# Patient Record
Sex: Female | Born: 1950 | Race: White | Hispanic: No | Marital: Married | Smoking: Never smoker
Health system: Southern US, Community
[De-identification: ages and names within clinical notes are randomized; demographics above are authoritative.]

## PROBLEM LIST (undated history)

## (undated) DIAGNOSIS — I499 Cardiac arrhythmia, unspecified: Secondary | ICD-10-CM

## (undated) DIAGNOSIS — E041 Nontoxic single thyroid nodule: Secondary | ICD-10-CM

## (undated) DIAGNOSIS — Z85850 Personal history of malignant neoplasm of thyroid: Secondary | ICD-10-CM

## (undated) DIAGNOSIS — K219 Gastro-esophageal reflux disease without esophagitis: Secondary | ICD-10-CM

## (undated) DIAGNOSIS — Z2883 Immunization not carried out due to unavailability of vaccine: Secondary | ICD-10-CM

## (undated) DIAGNOSIS — I201 Angina pectoris with documented spasm: Secondary | ICD-10-CM

## (undated) DIAGNOSIS — F419 Anxiety disorder, unspecified: Secondary | ICD-10-CM

## (undated) DIAGNOSIS — K5909 Other constipation: Secondary | ICD-10-CM

## (undated) DIAGNOSIS — I10 Essential (primary) hypertension: Secondary | ICD-10-CM

## (undated) DIAGNOSIS — K589 Irritable bowel syndrome without diarrhea: Secondary | ICD-10-CM

## (undated) DIAGNOSIS — M1990 Unspecified osteoarthritis, unspecified site: Secondary | ICD-10-CM

## (undated) DIAGNOSIS — E785 Hyperlipidemia, unspecified: Secondary | ICD-10-CM

## (undated) DIAGNOSIS — H269 Unspecified cataract: Secondary | ICD-10-CM

## (undated) DIAGNOSIS — K449 Diaphragmatic hernia without obstruction or gangrene: Secondary | ICD-10-CM

## (undated) DIAGNOSIS — Z8679 Personal history of other diseases of the circulatory system: Secondary | ICD-10-CM

## (undated) DIAGNOSIS — R002 Palpitations: Secondary | ICD-10-CM

## (undated) DIAGNOSIS — S065X9A Traumatic subdural hemorrhage with loss of consciousness of unspecified duration, initial encounter: Secondary | ICD-10-CM

## (undated) DIAGNOSIS — D649 Anemia, unspecified: Secondary | ICD-10-CM

## (undated) DIAGNOSIS — M8580 Other specified disorders of bone density and structure, unspecified site: Secondary | ICD-10-CM

## (undated) DIAGNOSIS — R42 Dizziness and giddiness: Secondary | ICD-10-CM

## (undated) DIAGNOSIS — G2581 Restless legs syndrome: Secondary | ICD-10-CM

## (undated) DIAGNOSIS — G4700 Insomnia, unspecified: Secondary | ICD-10-CM

## (undated) DIAGNOSIS — A64 Unspecified sexually transmitted disease: Secondary | ICD-10-CM

## (undated) DIAGNOSIS — D071 Carcinoma in situ of vulva: Secondary | ICD-10-CM

## (undated) DIAGNOSIS — Z973 Presence of spectacles and contact lenses: Secondary | ICD-10-CM

## (undated) DIAGNOSIS — C73 Malignant neoplasm of thyroid gland: Secondary | ICD-10-CM

## (undated) DIAGNOSIS — K900 Celiac disease: Secondary | ICD-10-CM

## (undated) HISTORY — PX: THYROID LOBECTOMY: SHX420

## (undated) HISTORY — DX: Unspecified sexually transmitted disease: A64

## (undated) HISTORY — DX: Malignant neoplasm of thyroid gland: C73

## (undated) HISTORY — DX: Angina pectoris with documented spasm: I20.1

## (undated) HISTORY — DX: Diaphragmatic hernia without obstruction or gangrene: K44.9

## (undated) HISTORY — PX: THYROIDECTOMY, PARTIAL: SHX18

## (undated) HISTORY — DX: Anemia, unspecified: D64.9

## (undated) HISTORY — DX: Hyperlipidemia, unspecified: E78.5

## (undated) HISTORY — DX: Traumatic subdural hemorrhage with loss of consciousness of unspecified duration, initial encounter: S06.5X9A

## (undated) HISTORY — DX: Dizziness and giddiness: R42

## (undated) HISTORY — DX: Unspecified osteoarthritis, unspecified site: M19.90

## (undated) HISTORY — DX: Insomnia, unspecified: G47.00

## (undated) HISTORY — DX: Other specified disorders of bone density and structure, unspecified site: M85.80

## (undated) HISTORY — PX: REDUCTION MAMMAPLASTY: SUR839

## (undated) HISTORY — PX: COLONOSCOPY: SHX174

## (undated) HISTORY — PX: SUBDURAL HEMATOMA EVACUATION VIA CRANIOTOMY: SUR319

## (undated) HISTORY — DX: Gastro-esophageal reflux disease without esophagitis: K21.9

## (undated) HISTORY — DX: Restless legs syndrome: G25.81

## (undated) HISTORY — PX: TONSILLECTOMY: SUR1361

## (undated) HISTORY — DX: Irritable bowel syndrome, unspecified: K58.9

## (undated) HISTORY — PX: BREAST BIOPSY: SHX20

## (undated) HISTORY — DX: Essential (primary) hypertension: I10

## (undated) HISTORY — PX: ESOPHAGOGASTRODUODENOSCOPY: SHX1529

## (undated) HISTORY — DX: Anxiety disorder, unspecified: F41.9

---
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
## 1984-05-09 HISTORY — PX: ABDOMINAL HYSTERECTOMY: SHX81

## 1997-05-09 HISTORY — PX: BREAST REDUCTION SURGERY: SHX8

## 1998-06-12 ENCOUNTER — Other Ambulatory Visit: Admission: RE | Admit: 1998-06-12 | Discharge: 1998-06-12 | Payer: Self-pay | Admitting: Obstetrics and Gynecology

## 1998-12-15 ENCOUNTER — Ambulatory Visit: Admission: RE | Admit: 1998-12-15 | Discharge: 1998-12-15 | Payer: Self-pay | Admitting: Gynecologic Oncology

## 1999-04-13 ENCOUNTER — Encounter: Payer: Self-pay | Admitting: Emergency Medicine

## 1999-04-13 ENCOUNTER — Emergency Department (HOSPITAL_COMMUNITY): Admission: EM | Admit: 1999-04-13 | Discharge: 1999-04-13 | Payer: Self-pay | Admitting: Emergency Medicine

## 1999-04-22 ENCOUNTER — Ambulatory Visit (HOSPITAL_COMMUNITY): Admission: RE | Admit: 1999-04-22 | Discharge: 1999-04-22 | Payer: Self-pay | Admitting: Gastroenterology

## 1999-07-06 ENCOUNTER — Other Ambulatory Visit: Admission: RE | Admit: 1999-07-06 | Discharge: 1999-07-06 | Payer: Self-pay | Admitting: Obstetrics and Gynecology

## 2000-05-09 HISTORY — PX: KNEE SURGERY: SHX244

## 2000-05-09 HISTORY — PX: KNEE ARTHROSCOPY: SUR90

## 2000-08-25 ENCOUNTER — Encounter: Payer: Self-pay | Admitting: Emergency Medicine

## 2000-08-25 ENCOUNTER — Emergency Department (HOSPITAL_COMMUNITY): Admission: EM | Admit: 2000-08-25 | Discharge: 2000-08-25 | Payer: Self-pay | Admitting: Emergency Medicine

## 2000-09-04 ENCOUNTER — Other Ambulatory Visit: Admission: RE | Admit: 2000-09-04 | Discharge: 2000-09-04 | Payer: Self-pay | Admitting: Obstetrics and Gynecology

## 2001-03-29 ENCOUNTER — Encounter: Payer: Self-pay | Admitting: Otolaryngology

## 2001-03-29 ENCOUNTER — Encounter: Payer: Self-pay | Admitting: Gastroenterology

## 2001-03-29 ENCOUNTER — Ambulatory Visit (HOSPITAL_COMMUNITY): Admission: RE | Admit: 2001-03-29 | Discharge: 2001-03-29 | Payer: Self-pay | Admitting: Otolaryngology

## 2001-04-04 ENCOUNTER — Other Ambulatory Visit: Admission: RE | Admit: 2001-04-04 | Discharge: 2001-04-04 | Payer: Self-pay | Admitting: Otolaryngology

## 2001-04-17 ENCOUNTER — Encounter: Admission: RE | Admit: 2001-04-17 | Discharge: 2001-04-17 | Payer: Self-pay | Admitting: Otolaryngology

## 2001-04-17 ENCOUNTER — Encounter: Payer: Self-pay | Admitting: Otolaryngology

## 2001-04-17 ENCOUNTER — Encounter: Payer: Self-pay | Admitting: Gastroenterology

## 2001-05-03 ENCOUNTER — Encounter: Payer: Self-pay | Admitting: Gastroenterology

## 2001-05-04 ENCOUNTER — Encounter (INDEPENDENT_AMBULATORY_CARE_PROVIDER_SITE_OTHER): Payer: Self-pay | Admitting: *Deleted

## 2001-05-04 ENCOUNTER — Encounter: Payer: Self-pay | Admitting: Gastroenterology

## 2001-05-04 ENCOUNTER — Ambulatory Visit (HOSPITAL_COMMUNITY): Admission: RE | Admit: 2001-05-04 | Discharge: 2001-05-05 | Payer: Self-pay | Admitting: Otolaryngology

## 2001-07-04 ENCOUNTER — Other Ambulatory Visit: Admission: RE | Admit: 2001-07-04 | Discharge: 2001-07-04 | Payer: Self-pay | Admitting: Obstetrics and Gynecology

## 2003-12-08 ENCOUNTER — Encounter: Admission: RE | Admit: 2003-12-08 | Discharge: 2003-12-08 | Payer: Self-pay | Admitting: Otolaryngology

## 2003-12-08 ENCOUNTER — Encounter: Payer: Self-pay | Admitting: Gastroenterology

## 2003-12-11 ENCOUNTER — Other Ambulatory Visit: Admission: RE | Admit: 2003-12-11 | Discharge: 2003-12-11 | Payer: Self-pay | Admitting: Obstetrics and Gynecology

## 2004-03-26 ENCOUNTER — Ambulatory Visit (HOSPITAL_COMMUNITY): Admission: RE | Admit: 2004-03-26 | Discharge: 2004-03-26 | Payer: Self-pay | Admitting: Gastroenterology

## 2004-04-14 ENCOUNTER — Ambulatory Visit: Payer: Self-pay | Admitting: Internal Medicine

## 2004-06-30 ENCOUNTER — Ambulatory Visit: Payer: Self-pay | Admitting: Internal Medicine

## 2004-08-17 ENCOUNTER — Ambulatory Visit: Payer: Self-pay | Admitting: Internal Medicine

## 2004-08-24 ENCOUNTER — Ambulatory Visit: Payer: Self-pay | Admitting: Internal Medicine

## 2004-11-03 ENCOUNTER — Ambulatory Visit (HOSPITAL_COMMUNITY): Admission: RE | Admit: 2004-11-03 | Discharge: 2004-11-03 | Payer: Self-pay | Admitting: *Deleted

## 2004-11-10 ENCOUNTER — Emergency Department (HOSPITAL_COMMUNITY): Admission: EM | Admit: 2004-11-10 | Discharge: 2004-11-10 | Payer: Self-pay | Admitting: Emergency Medicine

## 2004-11-11 ENCOUNTER — Ambulatory Visit (HOSPITAL_COMMUNITY): Admission: RE | Admit: 2004-11-11 | Discharge: 2004-11-11 | Payer: Self-pay | Admitting: Gastroenterology

## 2004-11-19 ENCOUNTER — Ambulatory Visit (HOSPITAL_COMMUNITY): Admission: RE | Admit: 2004-11-19 | Discharge: 2004-11-19 | Payer: Self-pay | Admitting: Gastroenterology

## 2004-12-13 ENCOUNTER — Other Ambulatory Visit: Admission: RE | Admit: 2004-12-13 | Discharge: 2004-12-13 | Payer: Self-pay | Admitting: Addiction Medicine

## 2004-12-22 ENCOUNTER — Emergency Department (HOSPITAL_COMMUNITY): Admission: EM | Admit: 2004-12-22 | Discharge: 2004-12-22 | Payer: Self-pay | Admitting: Emergency Medicine

## 2005-03-24 ENCOUNTER — Ambulatory Visit: Payer: Self-pay | Admitting: Family Medicine

## 2005-05-30 ENCOUNTER — Ambulatory Visit: Payer: Self-pay | Admitting: Internal Medicine

## 2005-09-27 ENCOUNTER — Ambulatory Visit: Payer: Self-pay | Admitting: Family Medicine

## 2005-11-16 ENCOUNTER — Ambulatory Visit: Payer: Self-pay | Admitting: Internal Medicine

## 2005-11-17 ENCOUNTER — Ambulatory Visit: Payer: Self-pay | Admitting: Internal Medicine

## 2005-12-29 ENCOUNTER — Other Ambulatory Visit: Admission: RE | Admit: 2005-12-29 | Discharge: 2005-12-29 | Payer: Self-pay | Admitting: Obstetrics and Gynecology

## 2006-01-04 ENCOUNTER — Emergency Department (HOSPITAL_COMMUNITY): Admission: EM | Admit: 2006-01-04 | Discharge: 2006-01-04 | Payer: Self-pay | Admitting: Emergency Medicine

## 2006-01-26 ENCOUNTER — Ambulatory Visit: Payer: Self-pay | Admitting: Internal Medicine

## 2006-04-11 ENCOUNTER — Ambulatory Visit: Payer: Self-pay | Admitting: Internal Medicine

## 2006-04-12 ENCOUNTER — Ambulatory Visit: Payer: Self-pay | Admitting: Internal Medicine

## 2006-04-12 LAB — CONVERTED CEMR LAB
ALT: 28 units/L (ref 0–40)
AST: 31 units/L (ref 0–37)
LDL Cholesterol: 97 mg/dL (ref 0–99)
Triglyceride fasting, serum: 70 mg/dL (ref 0–149)

## 2006-06-23 ENCOUNTER — Ambulatory Visit: Payer: Self-pay | Admitting: Internal Medicine

## 2006-09-05 ENCOUNTER — Ambulatory Visit: Payer: Self-pay | Admitting: Internal Medicine

## 2006-09-05 LAB — CONVERTED CEMR LAB
ALT: 28 units/L (ref 0–40)
AST: 28 units/L (ref 0–37)
Creatinine,U: 82.4 mg/dL
Total CHOL/HDL Ratio: 2.7
Triglycerides: 72 mg/dL (ref 0–149)

## 2006-10-03 DIAGNOSIS — E162 Hypoglycemia, unspecified: Secondary | ICD-10-CM | POA: Insufficient documentation

## 2006-10-03 DIAGNOSIS — K219 Gastro-esophageal reflux disease without esophagitis: Secondary | ICD-10-CM | POA: Insufficient documentation

## 2006-10-03 DIAGNOSIS — R0989 Other specified symptoms and signs involving the circulatory and respiratory systems: Secondary | ICD-10-CM | POA: Insufficient documentation

## 2006-10-03 DIAGNOSIS — M8580 Other specified disorders of bone density and structure, unspecified site: Secondary | ICD-10-CM | POA: Insufficient documentation

## 2006-10-03 DIAGNOSIS — R7989 Other specified abnormal findings of blood chemistry: Secondary | ICD-10-CM | POA: Insufficient documentation

## 2006-10-03 DIAGNOSIS — Z85850 Personal history of malignant neoplasm of thyroid: Secondary | ICD-10-CM | POA: Insufficient documentation

## 2006-11-29 ENCOUNTER — Encounter: Payer: Self-pay | Admitting: Internal Medicine

## 2007-01-26 ENCOUNTER — Other Ambulatory Visit: Admission: RE | Admit: 2007-01-26 | Discharge: 2007-01-26 | Payer: Self-pay | Admitting: Obstetrics and Gynecology

## 2007-07-05 ENCOUNTER — Ambulatory Visit: Payer: Self-pay | Admitting: Internal Medicine

## 2007-07-05 DIAGNOSIS — G479 Sleep disorder, unspecified: Secondary | ICD-10-CM

## 2007-07-05 DIAGNOSIS — M1990 Unspecified osteoarthritis, unspecified site: Secondary | ICD-10-CM | POA: Insufficient documentation

## 2007-07-05 DIAGNOSIS — G4700 Insomnia, unspecified: Secondary | ICD-10-CM | POA: Insufficient documentation

## 2007-07-07 LAB — CONVERTED CEMR LAB
Eosinophils Relative: 2.8 % (ref 0.0–5.0)
Ferritin: 13.1 ng/mL (ref 10.0–291.0)
Free T4: 0.8 ng/dL (ref 0.6–1.6)
HCT: 37 % (ref 36.0–46.0)
MCV: 90.9 fL (ref 78.0–100.0)
Neutrophils Relative %: 63.8 % (ref 43.0–77.0)
RBC: 4.07 M/uL (ref 3.87–5.11)
RDW: 12.8 % (ref 11.5–14.6)
Rhuematoid fact SerPl-aCnc: <20 intl units/mL — ABNORMAL LOW (ref 0.0–20.0)
Sed Rate: 14 mm/hr (ref 0–25)
TSH: 0.89 microintl units/mL (ref 0.35–5.50)
WBC: 5.5 10*3/uL (ref 4.5–10.5)

## 2007-07-10 ENCOUNTER — Encounter (INDEPENDENT_AMBULATORY_CARE_PROVIDER_SITE_OTHER): Payer: Self-pay | Admitting: *Deleted

## 2007-07-25 ENCOUNTER — Telehealth (INDEPENDENT_AMBULATORY_CARE_PROVIDER_SITE_OTHER): Payer: Self-pay | Admitting: *Deleted

## 2007-07-26 ENCOUNTER — Ambulatory Visit: Payer: Self-pay | Admitting: Internal Medicine

## 2007-08-02 ENCOUNTER — Ambulatory Visit: Payer: Self-pay | Admitting: Gastroenterology

## 2007-08-02 ENCOUNTER — Encounter (INDEPENDENT_AMBULATORY_CARE_PROVIDER_SITE_OTHER): Payer: Self-pay | Admitting: *Deleted

## 2007-08-02 LAB — CONVERTED CEMR LAB
Albumin: 4.3 g/dL (ref 3.5–5.2)
Alkaline Phosphatase: 60 units/L (ref 39–117)
BUN: 14 mg/dL (ref 6–23)
Basophils Absolute: 0 10*3/uL (ref 0.0–0.1)
Bilirubin, Direct: 0.1 mg/dL (ref 0.0–0.3)
Calcium: 9.5 mg/dL (ref 8.4–10.5)
GFR calc Af Amer: 95 mL/min
GFR calc non Af Amer: 79 mL/min
Lymphocytes Relative: 26.3 % (ref 12.0–46.0)
MCHC: 33.1 g/dL (ref 30.0–36.0)
Neutrophils Relative %: 65.4 % (ref 43.0–77.0)
Potassium: 4.5 meq/L (ref 3.5–5.1)
RDW: 12.5 % (ref 11.5–14.6)
Saturation Ratios: 16.6 % — ABNORMAL LOW (ref 20.0–50.0)
Sed Rate: 21 mm/hr (ref 0–25)
Sodium: 139 meq/L (ref 135–145)
Tissue Transglutaminase Ab, IgA: 0.4 units (ref <7)
Transferrin: 309.9 mg/dL (ref >=212.0)
Vitamin B-12: 826 pg/mL (ref 211–911)

## 2007-08-08 ENCOUNTER — Ambulatory Visit: Payer: Self-pay | Admitting: Gastroenterology

## 2007-08-08 ENCOUNTER — Encounter: Payer: Self-pay | Admitting: Internal Medicine

## 2007-08-08 ENCOUNTER — Encounter: Payer: Self-pay | Admitting: Gastroenterology

## 2007-08-09 ENCOUNTER — Telehealth: Payer: Self-pay | Admitting: Internal Medicine

## 2007-10-30 ENCOUNTER — Ambulatory Visit: Payer: Self-pay | Admitting: Internal Medicine

## 2007-11-02 ENCOUNTER — Telehealth (INDEPENDENT_AMBULATORY_CARE_PROVIDER_SITE_OTHER): Payer: Self-pay | Admitting: *Deleted

## 2007-11-02 LAB — CONVERTED CEMR LAB
Basophils Absolute: 0 10*3/uL (ref 0.0–0.1)
Basophils Relative: 0 % (ref 0.0–1.0)
Eosinophils Absolute: 0.1 10*3/uL (ref 0.0–0.7)
HCT: 32.7 % — ABNORMAL LOW (ref 36.0–46.0)
MCHC: 34.2 g/dL (ref 30.0–36.0)
MCV: 91.2 fL (ref 78.0–100.0)
Monocytes Absolute: 0.2 10*3/uL (ref 0.1–1.0)
Neutro Abs: 3.5 10*3/uL (ref 1.4–7.7)
Neutrophils Relative %: 64.3 % (ref 43.0–77.0)
RBC: 3.58 M/uL — ABNORMAL LOW (ref 3.87–5.11)

## 2007-11-27 ENCOUNTER — Ambulatory Visit: Payer: Self-pay | Admitting: Internal Medicine

## 2007-11-27 DIAGNOSIS — K900 Celiac disease: Secondary | ICD-10-CM | POA: Insufficient documentation

## 2007-11-27 LAB — CONVERTED CEMR LAB
Nitrite: NEGATIVE
Protein, U semiquant: NEGATIVE
Urobilinogen, UA: 0.2

## 2007-11-29 ENCOUNTER — Telehealth (INDEPENDENT_AMBULATORY_CARE_PROVIDER_SITE_OTHER): Payer: Self-pay | Admitting: *Deleted

## 2007-11-30 ENCOUNTER — Telehealth (INDEPENDENT_AMBULATORY_CARE_PROVIDER_SITE_OTHER): Payer: Self-pay | Admitting: *Deleted

## 2007-12-03 LAB — CONVERTED CEMR LAB
Eosinophils Absolute: 0.1 10*3/uL (ref 0.0–0.7)
Eosinophils Relative: 1.8 % (ref 0.0–5.0)
Folate: >20 ng/mL
HCT: 35.3 % — ABNORMAL LOW (ref 36.0–46.0)
HDL: 70.9 mg/dL (ref >39.0)
MCHC: 33.8 g/dL (ref 30.0–36.0)
MCV: 89.7 fL (ref 78.0–100.0)
Monocytes Absolute: 0.4 10*3/uL (ref 0.1–1.0)
Platelets: 241 10*3/uL (ref 150–400)
RDW: 12.5 % (ref 11.5–14.6)
Triglycerides: 83 mg/dL (ref 0–149)
WBC: 5.1 10*3/uL (ref 4.5–10.5)

## 2007-12-05 ENCOUNTER — Telehealth (INDEPENDENT_AMBULATORY_CARE_PROVIDER_SITE_OTHER): Payer: Self-pay | Admitting: *Deleted

## 2008-01-28 ENCOUNTER — Ambulatory Visit: Payer: Self-pay | Admitting: Obstetrics and Gynecology

## 2008-01-28 ENCOUNTER — Other Ambulatory Visit: Admission: RE | Admit: 2008-01-28 | Discharge: 2008-01-28 | Payer: Self-pay | Admitting: Obstetrics and Gynecology

## 2008-01-28 ENCOUNTER — Encounter: Payer: Self-pay | Admitting: Obstetrics and Gynecology

## 2008-02-11 ENCOUNTER — Encounter: Admission: RE | Admit: 2008-02-11 | Discharge: 2008-02-11 | Payer: Self-pay | Admitting: Obstetrics and Gynecology

## 2008-02-16 ENCOUNTER — Emergency Department (HOSPITAL_BASED_OUTPATIENT_CLINIC_OR_DEPARTMENT_OTHER): Admission: EM | Admit: 2008-02-16 | Discharge: 2008-02-16 | Payer: Self-pay | Admitting: Emergency Medicine

## 2008-03-04 ENCOUNTER — Telehealth (INDEPENDENT_AMBULATORY_CARE_PROVIDER_SITE_OTHER): Payer: Self-pay | Admitting: *Deleted

## 2008-03-10 ENCOUNTER — Ambulatory Visit: Payer: Self-pay | Admitting: Internal Medicine

## 2008-03-19 ENCOUNTER — Encounter (INDEPENDENT_AMBULATORY_CARE_PROVIDER_SITE_OTHER): Payer: Self-pay | Admitting: *Deleted

## 2008-03-19 ENCOUNTER — Telehealth (INDEPENDENT_AMBULATORY_CARE_PROVIDER_SITE_OTHER): Payer: Self-pay | Admitting: *Deleted

## 2008-03-19 LAB — CONVERTED CEMR LAB
ALT: 19 units/L (ref 0–35)
Alkaline Phosphatase: 64 units/L (ref 39–117)
Bilirubin, Direct: 0.1 mg/dL (ref 0.0–0.3)
CO2: 29 meq/L (ref 19–32)
Chloride: 107 meq/L (ref 96–112)
Cholesterol: 240 mg/dL (ref 0–200)
Glucose, Bld: 99 mg/dL (ref 70–99)
Hemoglobin: 12.2 g/dL (ref 12.0–15.0)
Lymphocytes Relative: 30.5 % (ref 12.0–46.0)
Monocytes Relative: 4.7 % (ref 3.0–12.0)
Neutro Abs: 3 10*3/uL (ref 1.4–7.7)
Platelets: 223 10*3/uL (ref 150–400)
Potassium: 4.6 meq/L (ref 3.5–5.1)
RDW: 13.4 % (ref 11.5–14.6)
Sodium: 143 meq/L (ref 135–145)
Total CHOL/HDL Ratio: 3.3
Total Protein: 7.5 g/dL (ref 6.0–8.3)
VLDL: 20 mg/dL (ref 0–40)

## 2008-05-09 HISTORY — PX: SUBDURAL HEMATOMA EVACUATION VIA CRANIOTOMY: SUR319

## 2008-07-21 ENCOUNTER — Ambulatory Visit: Payer: Self-pay | Admitting: Obstetrics and Gynecology

## 2008-08-15 ENCOUNTER — Telehealth (INDEPENDENT_AMBULATORY_CARE_PROVIDER_SITE_OTHER): Payer: Self-pay | Admitting: *Deleted

## 2008-08-20 ENCOUNTER — Telehealth: Payer: Self-pay | Admitting: Internal Medicine

## 2008-09-08 ENCOUNTER — Ambulatory Visit: Payer: Self-pay | Admitting: Obstetrics and Gynecology

## 2008-09-16 ENCOUNTER — Telehealth (INDEPENDENT_AMBULATORY_CARE_PROVIDER_SITE_OTHER): Payer: Self-pay | Admitting: *Deleted

## 2008-09-23 ENCOUNTER — Ambulatory Visit: Payer: Self-pay | Admitting: Internal Medicine

## 2008-09-23 LAB — CONVERTED CEMR LAB
HDL: 72.8 mg/dL (ref >39.00)
TSH: 0.95 microintl units/mL (ref 0.35–5.50)
Total CHOL/HDL Ratio: 3
VLDL: 19.8 mg/dL (ref 0.0–40.0)

## 2008-09-26 ENCOUNTER — Ambulatory Visit: Payer: Self-pay | Admitting: Internal Medicine

## 2008-09-26 DIAGNOSIS — E785 Hyperlipidemia, unspecified: Secondary | ICD-10-CM | POA: Insufficient documentation

## 2008-10-02 ENCOUNTER — Encounter: Admission: RE | Admit: 2008-10-02 | Discharge: 2008-10-02 | Payer: Self-pay | Admitting: Internal Medicine

## 2008-10-03 ENCOUNTER — Encounter (INDEPENDENT_AMBULATORY_CARE_PROVIDER_SITE_OTHER): Payer: Self-pay | Admitting: *Deleted

## 2008-10-07 ENCOUNTER — Encounter (INDEPENDENT_AMBULATORY_CARE_PROVIDER_SITE_OTHER): Payer: Self-pay | Admitting: *Deleted

## 2008-10-08 ENCOUNTER — Telehealth (INDEPENDENT_AMBULATORY_CARE_PROVIDER_SITE_OTHER): Payer: Self-pay | Admitting: *Deleted

## 2008-10-17 ENCOUNTER — Ambulatory Visit: Payer: Self-pay | Admitting: Family Medicine

## 2008-10-17 LAB — CONVERTED CEMR LAB
Ketones, urine, test strip: NEGATIVE
Nitrite: NEGATIVE
Protein, U semiquant: NEGATIVE

## 2008-10-19 ENCOUNTER — Emergency Department (HOSPITAL_BASED_OUTPATIENT_CLINIC_OR_DEPARTMENT_OTHER): Admission: EM | Admit: 2008-10-19 | Discharge: 2008-10-19 | Payer: Self-pay | Admitting: Emergency Medicine

## 2008-10-19 ENCOUNTER — Ambulatory Visit: Payer: Self-pay | Admitting: Diagnostic Radiology

## 2008-10-20 ENCOUNTER — Encounter: Payer: Self-pay | Admitting: Family Medicine

## 2008-10-21 ENCOUNTER — Encounter: Payer: Self-pay | Admitting: Family Medicine

## 2008-10-22 ENCOUNTER — Telehealth (INDEPENDENT_AMBULATORY_CARE_PROVIDER_SITE_OTHER): Payer: Self-pay | Admitting: *Deleted

## 2008-11-01 ENCOUNTER — Ambulatory Visit: Payer: Self-pay | Admitting: Family Medicine

## 2008-11-01 LAB — CONVERTED CEMR LAB
Bilirubin Urine: NEGATIVE
Glucose, Urine, Semiquant: NEGATIVE
Ketones, urine, test strip: NEGATIVE
Protein, U semiquant: NEGATIVE
Urobilinogen, UA: 0.2
pH: 5

## 2008-11-18 ENCOUNTER — Ambulatory Visit: Payer: Self-pay | Admitting: Obstetrics and Gynecology

## 2009-01-14 ENCOUNTER — Encounter: Payer: Self-pay | Admitting: Internal Medicine

## 2009-01-28 ENCOUNTER — Ambulatory Visit: Payer: Self-pay | Admitting: Obstetrics and Gynecology

## 2009-01-28 ENCOUNTER — Encounter: Payer: Self-pay | Admitting: Obstetrics and Gynecology

## 2009-01-28 ENCOUNTER — Other Ambulatory Visit: Admission: RE | Admit: 2009-01-28 | Discharge: 2009-01-28 | Payer: Self-pay | Admitting: Obstetrics and Gynecology

## 2009-02-03 ENCOUNTER — Encounter: Payer: Self-pay | Admitting: Internal Medicine

## 2009-02-16 DIAGNOSIS — K2990 Gastroduodenitis, unspecified, without bleeding: Secondary | ICD-10-CM

## 2009-02-16 DIAGNOSIS — K589 Irritable bowel syndrome without diarrhea: Secondary | ICD-10-CM | POA: Insufficient documentation

## 2009-02-16 DIAGNOSIS — K449 Diaphragmatic hernia without obstruction or gangrene: Secondary | ICD-10-CM | POA: Insufficient documentation

## 2009-02-16 DIAGNOSIS — K2970 Gastritis, unspecified, without bleeding: Secondary | ICD-10-CM | POA: Insufficient documentation

## 2009-02-16 DIAGNOSIS — K2980 Duodenitis without bleeding: Secondary | ICD-10-CM | POA: Insufficient documentation

## 2009-02-18 ENCOUNTER — Encounter: Admission: RE | Admit: 2009-02-18 | Discharge: 2009-02-18 | Payer: Self-pay | Admitting: Internal Medicine

## 2009-03-24 ENCOUNTER — Encounter: Payer: Self-pay | Admitting: Internal Medicine

## 2009-04-20 ENCOUNTER — Telehealth (INDEPENDENT_AMBULATORY_CARE_PROVIDER_SITE_OTHER): Payer: Self-pay | Admitting: *Deleted

## 2009-04-20 ENCOUNTER — Ambulatory Visit: Payer: Self-pay | Admitting: Internal Medicine

## 2009-04-29 ENCOUNTER — Telehealth (INDEPENDENT_AMBULATORY_CARE_PROVIDER_SITE_OTHER): Payer: Self-pay | Admitting: *Deleted

## 2009-04-30 ENCOUNTER — Ambulatory Visit: Payer: Self-pay | Admitting: Cardiology

## 2009-04-30 ENCOUNTER — Ambulatory Visit: Payer: Self-pay | Admitting: Internal Medicine

## 2009-04-30 ENCOUNTER — Inpatient Hospital Stay (HOSPITAL_COMMUNITY): Admission: AD | Admit: 2009-04-30 | Discharge: 2009-05-05 | Payer: Self-pay | Admitting: Neurosurgery

## 2009-04-30 ENCOUNTER — Telehealth (INDEPENDENT_AMBULATORY_CARE_PROVIDER_SITE_OTHER): Payer: Self-pay | Admitting: *Deleted

## 2009-04-30 DIAGNOSIS — I6200 Nontraumatic subdural hemorrhage, unspecified: Secondary | ICD-10-CM | POA: Insufficient documentation

## 2009-04-30 LAB — CONVERTED CEMR LAB: Creatinine, Ser: 0.8 mg/dL (ref 0.4–1.2)

## 2009-05-11 ENCOUNTER — Encounter: Payer: Self-pay | Admitting: Internal Medicine

## 2009-05-11 ENCOUNTER — Encounter: Admission: RE | Admit: 2009-05-11 | Discharge: 2009-05-11 | Payer: Self-pay | Admitting: Neurosurgery

## 2009-05-27 ENCOUNTER — Encounter: Payer: Self-pay | Admitting: Internal Medicine

## 2009-05-27 ENCOUNTER — Telehealth: Payer: Self-pay | Admitting: Internal Medicine

## 2009-05-27 ENCOUNTER — Encounter: Admission: RE | Admit: 2009-05-27 | Discharge: 2009-05-27 | Payer: Self-pay | Admitting: Neurosurgery

## 2009-06-05 ENCOUNTER — Telehealth: Payer: Self-pay | Admitting: Internal Medicine

## 2009-06-10 ENCOUNTER — Ambulatory Visit: Payer: Self-pay | Admitting: Family

## 2009-06-25 ENCOUNTER — Ambulatory Visit: Payer: Self-pay | Admitting: Family

## 2009-06-25 DIAGNOSIS — J309 Allergic rhinitis, unspecified: Secondary | ICD-10-CM | POA: Insufficient documentation

## 2009-06-25 DIAGNOSIS — J209 Acute bronchitis, unspecified: Secondary | ICD-10-CM | POA: Insufficient documentation

## 2009-07-21 ENCOUNTER — Encounter: Payer: Self-pay | Admitting: Gastroenterology

## 2009-07-22 ENCOUNTER — Ambulatory Visit: Payer: Self-pay | Admitting: Internal Medicine

## 2009-08-06 ENCOUNTER — Telehealth (INDEPENDENT_AMBULATORY_CARE_PROVIDER_SITE_OTHER): Payer: Self-pay | Admitting: *Deleted

## 2009-08-25 ENCOUNTER — Ambulatory Visit: Payer: Self-pay | Admitting: Cardiovascular Disease

## 2009-08-25 ENCOUNTER — Telehealth (INDEPENDENT_AMBULATORY_CARE_PROVIDER_SITE_OTHER): Payer: Self-pay | Admitting: *Deleted

## 2009-08-25 ENCOUNTER — Ambulatory Visit: Payer: Self-pay | Admitting: Internal Medicine

## 2009-08-26 ENCOUNTER — Encounter (INDEPENDENT_AMBULATORY_CARE_PROVIDER_SITE_OTHER): Payer: Self-pay | Admitting: *Deleted

## 2009-08-27 LAB — CONVERTED CEMR LAB
Basophils Relative: 0.2 % (ref 0.0–3.0)
Eosinophils Relative: 1.3 % (ref 0.0–5.0)
HCT: 37.8 % (ref 36.0–46.0)
MCV: 89.8 fL (ref 78.0–100.0)
Monocytes Absolute: 0.4 10*3/uL (ref 0.1–1.0)
Monocytes Relative: 8 % (ref 3.0–12.0)
Neutrophils Relative %: 65.2 % (ref 43.0–77.0)
RBC: 4.21 M/uL (ref 3.87–5.11)
WBC: 4.4 10*3/uL — ABNORMAL LOW (ref 4.5–10.5)

## 2009-09-09 ENCOUNTER — Ambulatory Visit: Payer: Self-pay | Admitting: Internal Medicine

## 2009-09-10 ENCOUNTER — Encounter: Payer: Self-pay | Admitting: Internal Medicine

## 2009-09-22 ENCOUNTER — Encounter: Payer: Self-pay | Admitting: Gastroenterology

## 2009-10-17 ENCOUNTER — Ambulatory Visit: Payer: Self-pay | Admitting: Family Medicine

## 2009-10-17 ENCOUNTER — Encounter: Payer: Self-pay | Admitting: Internal Medicine

## 2009-10-17 DIAGNOSIS — M545 Low back pain, unspecified: Secondary | ICD-10-CM | POA: Insufficient documentation

## 2009-10-17 LAB — CONVERTED CEMR LAB
Bilirubin Urine: NEGATIVE
Blood in Urine, dipstick: NEGATIVE
Glucose, Urine, Semiquant: NEGATIVE
Ketones, urine, test strip: NEGATIVE
Nitrite: NEGATIVE
Protein, U semiquant: NEGATIVE
Specific Gravity, Urine: <1.005
Urobilinogen, UA: 0.2
WBC Urine, dipstick: NEGATIVE
pH: 6

## 2009-10-24 ENCOUNTER — Emergency Department (HOSPITAL_COMMUNITY): Admission: EM | Admit: 2009-10-24 | Discharge: 2009-10-24 | Payer: Self-pay | Admitting: Emergency Medicine

## 2009-10-26 ENCOUNTER — Encounter: Payer: Self-pay | Admitting: Gastroenterology

## 2009-11-18 ENCOUNTER — Encounter: Payer: Self-pay | Admitting: Gastroenterology

## 2009-11-24 ENCOUNTER — Encounter (INDEPENDENT_AMBULATORY_CARE_PROVIDER_SITE_OTHER): Payer: Self-pay | Admitting: *Deleted

## 2009-11-24 ENCOUNTER — Ambulatory Visit: Payer: Self-pay | Admitting: Gastroenterology

## 2009-11-30 ENCOUNTER — Ambulatory Visit: Payer: Self-pay | Admitting: Gastroenterology

## 2009-12-01 LAB — CONVERTED CEMR LAB: UREASE: NEGATIVE

## 2009-12-03 ENCOUNTER — Encounter: Payer: Self-pay | Admitting: Gastroenterology

## 2010-01-27 ENCOUNTER — Telehealth: Payer: Self-pay | Admitting: Gastroenterology

## 2010-01-27 DIAGNOSIS — R1013 Epigastric pain: Secondary | ICD-10-CM | POA: Insufficient documentation

## 2010-01-29 ENCOUNTER — Telehealth: Payer: Self-pay | Admitting: Gastroenterology

## 2010-02-08 ENCOUNTER — Ambulatory Visit: Payer: Self-pay | Admitting: Obstetrics and Gynecology

## 2010-02-08 ENCOUNTER — Other Ambulatory Visit: Admission: RE | Admit: 2010-02-08 | Discharge: 2010-02-08 | Payer: Self-pay | Admitting: Obstetrics and Gynecology

## 2010-02-10 ENCOUNTER — Encounter: Admission: RE | Admit: 2010-02-10 | Discharge: 2010-02-10 | Payer: Self-pay | Admitting: Internal Medicine

## 2010-02-23 ENCOUNTER — Encounter: Admission: RE | Admit: 2010-02-23 | Discharge: 2010-02-23 | Payer: Self-pay | Admitting: Internal Medicine

## 2010-05-11 ENCOUNTER — Ambulatory Visit
Admission: RE | Admit: 2010-05-11 | Discharge: 2010-05-11 | Payer: Self-pay | Source: Home / Self Care | Attending: Obstetrics and Gynecology | Admitting: Obstetrics and Gynecology

## 2010-05-12 ENCOUNTER — Encounter
Admission: RE | Admit: 2010-05-12 | Discharge: 2010-05-12 | Payer: Self-pay | Source: Home / Self Care | Attending: Obstetrics and Gynecology | Admitting: Obstetrics and Gynecology

## 2010-05-30 ENCOUNTER — Encounter: Payer: Self-pay | Admitting: Neurosurgery

## 2010-06-04 ENCOUNTER — Emergency Department (INDEPENDENT_AMBULATORY_CARE_PROVIDER_SITE_OTHER)
Admission: EM | Admit: 2010-06-04 | Discharge: 2010-06-04 | Payer: BC Managed Care – PPO | Source: Home / Self Care | Admitting: Emergency Medicine

## 2010-06-04 DIAGNOSIS — Z043 Encounter for examination and observation following other accident: Secondary | ICD-10-CM

## 2010-06-04 DIAGNOSIS — IMO0002 Reserved for concepts with insufficient information to code with codable children: Secondary | ICD-10-CM

## 2010-06-04 DIAGNOSIS — M549 Dorsalgia, unspecified: Secondary | ICD-10-CM

## 2010-06-04 DIAGNOSIS — W108XXA Fall (on) (from) other stairs and steps, initial encounter: Secondary | ICD-10-CM

## 2010-06-04 DIAGNOSIS — R51 Headache: Secondary | ICD-10-CM

## 2010-06-10 NOTE — Miscellaneous (Signed)
Summary: Clotest  Clinical Lists Changes  Orders: Added new Test order of TLB-H Pylori Screen Gastric Biopsy (83013-CLOTEST) - Signed

## 2010-06-10 NOTE — Letter (Signed)
Summary: Primary Care Consult Scheduled Letter  [facility]   [place], [place] [address]   Phone: [phone]  Fax: [phone]      08/26/2009 MRN: [id number]  [place], [place]  [address]    Dear Ms. Dern,    We have scheduled an appointment for you.  At the recommendation of Dr. Unice Cobble, we have scheduled you for Pulmonary Function Test with [facility] on 09-11-2009 at 4:00pm.  Their address is [address]nd floor, [place] [address]. The office phone number is [phone].  If this appointment day and time is not convenient for you, please feel free to call the office of the doctor you are being referred to at the number listed above and reschedule the appointment.    It is important for you to keep your scheduled appointments. We are here to make sure you are given good patient care.   Thank you,    Renee, Patient Care Coordinator [place] [facility]

## 2010-06-10 NOTE — Letter (Signed)
Summary: EGD Instructions  [facility]  [place], [place] [address]   Phone: [phone]  Fax: [phone]       Amanda [name]    August 15, 1950    MRN: [id number]       Procedure Day /Date: Monday, 11/30/09     Arrival Time:  12:30     Procedure Time: 1:30     Location of Procedure:                    [facility] [place] (4th Floor)    PREPARATION FOR ENDOSCOPY   On 11/30/09  THE DAY OF THE PROCEDURE:  1.   No solid foods, milk or milk products are allowed after midnight the night before your procedure.  2.   Do not drink anything colored red or purple.  Avoid juices with pulp.  No orange juice.  3.  You may drink clear liquids until 11:30, which is 2 hours before your procedure.                                                                                                CLEAR LIQUIDS INCLUDE: Water Jello Ice Popsicles Tea (sugar ok, no milk/cream) Powdered fruit flavored drinks Coffee (sugar ok, no milk/cream) Gatorade Juice: apple, white grape, white cranberry  Lemonade Clear bullion, consomm, broth Carbonated beverages (any kind) Strained chicken noodle soup Hard Candy   MEDICATION INSTRUCTIONS  Unless otherwise instructed, you should take regular prescription medications with a small sip of water as early as possible the morning of your procedure.                   OTHER INSTRUCTIONS  You will need a responsible adult at least 61 years of age to accompany you and drive you home.   This person must remain in the waiting room during your procedure.  Wear loose fitting clothing that is easily removed.  Leave jewelry and other valuables at home.  However, you may wish to bring a book to read or an iPod/MP3 player to listen to music as you wait for your procedure to start.  Remove all body piercing jewelry and leave at home.  Total time from sign-in until discharge is approximately 2-3 hours.  You should  go home directly after your procedure and rest.  You can resume normal activities the day after your procedure.  The day of your procedure you should not:   Drive   Make legal decisions   Operate machinery   Drink alcohol   Return to work  You will receive specific instructions about eating, activities and medications before you leave.    The above instructions have been reviewed and explained to me by   _______________________    I fully understand and can verbalize these instructions _____________________________ Date _________

## 2010-06-10 NOTE — Progress Notes (Signed)
Summary: trouble sleep   Phone Note Call from Patient   Caller: Patient Summary of Call: Pt left VM stating  that she is still having trouble sleeping and would like to have rx for a med that will knock her out since she has been unable to sleep for the past month. left message to call office to get further detail if rx for lorazepam work with sleeping issue  and if so does she need refills since last filled 06-05-09 # 30 as needed ........................Marland KitchenFelecia Deloach CMA  August 06, 2009 2:43 PM   Follow-up for Phone Call        pt states that lorazepam did not work and would like to have a med call in to [facility]. to help her sleep. pls advise...............Marland KitchenFelecia Deloach CMA  August 06, 2009 4:42 PM   Additional Follow-up for Phone Call Additional follow up Details #1::        ambien 10 mg  #5  1 by mouth at bedtime ----not meant to be taken for long period of time---she will need to make appointment to take to [facility] about further evaluation Additional Follow-up by: Garnet Koyanagi DO,  August 06, 2009 4:53 PM    Additional Follow-up for Phone Call Additional follow up Details #2::     Pt states that Madelynn Done makes her sick, gives her an "electrical feeling" in her head. Are there any other opitions for her? [place]  August 06, 2009 5:01 PM   Additional Follow-up for Phone Call Additional follow up Details #3:: Details for Additional Follow-up Action Taken: Trazadone 83m  1/2 tab by mouth at bedtime #5  left message to call back. DAllyn KennerCMA  August 07, 2009 8:50 AM  Pt is aware. DAllyn KennerCMA  August 07, 2009 12:54 PM  Additional Follow-up by: YGarnet KoyanagiDO,  August 06, 2009 8:59 PM  New/Updated Medications: TRAZODONE HCL 50 MG TABS (TRAZODONE HCL) 1/2 tab by mouth at bedtime. Prescriptions: TRAZODONE HCL 50 MG TABS (TRAZODONE HCL) 1/2 tab by mouth at bedtime.  #5 x 0   Entered by:   DAllyn KennerCMA   Authorized by:   YGarnet KoyanagiDO   Signed by:    DAllyn KennerCMA on 08/07/2009   Method used:   Electronically to        [facility](retail)       [address]      [place]  [address]      Ph: [phone] [phone]      Fax: [phone]  RxID:   [id number]

## 2010-06-10 NOTE — Letter (Signed)
Summary: [facility]  [facility]   Imported By: Edmonia James 06/03/2009 14:57:57  _____________________________________________________________________  External Attachment:    Type:   Image     Comment:   External Document

## 2010-06-10 NOTE — Miscellaneous (Signed)
Summary: Orders Update pft charges  Clinical Lists Changes  Orders: Added new Service order of Carbon Monoxide diffusing w/capacity (94720) - Signed Added new Service order of Lung Volumes (94240) - Signed Added new Service order of Spirometry (Pre & Post) (94060) - Signed 

## 2010-06-10 NOTE — Progress Notes (Signed)
Summary: epigastric pain  Medications Added CARAFATE 1 GM/10ML SUSP (SUCRALFATE) Take 10 ml. q. 6 hrs.for epigastric pain LIBRAX 2.5-5 MG CAPS (CLIDINIUM-CHLORDIAZEPOXIDE) Take 1 p.o.t.i.d. ac       Phone Note Call from Patient Call back at Work Phone [phone]   Caller: Patient Call For: Dr. Sharlett Iles Reason for Call: Talk to Nurse Summary of Call: pt is having "severe" pain with her acid reflux Initial call taken by: Webb Laws,  January 27, 2010 2:16 PM  Follow-up for Phone Call        Has had epigastric pain for several weeks. Is taking protonix b.i.d.,acid soothe, digest gold and a probiotic but nothing has given her relief. Follow-up by: Abel Presto RN,  January 27, 2010 2:28 PM  New Problems: EPIGASTRIC PAIN (ICD-789.06)   Additional Follow-up for Phone Call Additional follow up Details #2::    need repeat manometry and ph probe off meds for 72h.add librax three times a day ac and qid liquid carafate and as needed tramadol 50 mg q 6-8... Follow-up by: Sable Feil MD Nolon Stalls 2:41 PM  Additional Follow-up for Phone Call Additional follow up Details #3:: Details for Additional Follow-up Action Taken: Wants only Librax and carafate sent to pharmacy at this time.Rx. sent. Will schedule testing and call her back. Additional Follow-up by: Abel Presto RN,  January 27, 2010 3:23 PM  New Problems: EPIGASTRIC PAIN (ICD-789.06) New/Updated Medications: CARAFATE 1 GM/10ML SUSP (SUCRALFATE) Take 10 ml. q. 6 hrs.for epigastric pain LIBRAX 2.5-5 MG CAPS (CLIDINIUM-CHLORDIAZEPOXIDE) Take 1 p.o.t.i.d. ac Prescriptions: LIBRAX 2.5-5 MG CAPS (CLIDINIUM-CHLORDIAZEPOXIDE) Take 1 p.o.t.i.d. ac  #90 x 1   Entered by:   Abel Presto RN   Authorized by:   Sable Feil MD [facility]   Signed by:   Abel Presto RN on 01/27/2010   Method used:   Electronically to        [facility] (retail)       [place], [place]  [address]     Ph: [phone] or [phone]       Fax: [phone]   RxID:   [id number] CARAFATE 1 GM/10ML SUSP (SUCRALFATE) Take 10 ml. q. 6 hrs.for epigastric pain  #414 ml x 1   Entered by:   Abel Presto RN   Authorized by:   Sable Feil MD [facility]   Signed by:   Abel Presto RN on 01/27/2010   Method used:   Electronically to        [facility] (retail)       [address]       [place], [place]  [address]       Ph: [phone] or [phone]       Fax: [phone]   RxID:   [id number]

## 2010-06-10 NOTE — Assessment & Plan Note (Signed)
Summary: BACH ACHE, URINATING A LOT, NO FEVER///SPH   Vital Signs:  Patient profile:   60 year old female Weight:      161 pounds Temp:     97.9 degrees F oral Pulse rate:   72 / minute Pulse rhythm:   regular BP sitting:   112 / 80  (right arm) Cuff size:   regular  Vitals Entered By: Marty Heck CMA (October 17, 2009 10:13 AM) CC: Frequent urination, low back pain.   History of Present Illness: R back has hurt for a while not - and urinating frequently  a couple of months - for the back - it comes and back  occ at night hurts to sit  no rash  pain is sharp   frequent urination started in the last 3 weeks  did up her fluids some with hot weather  gets up only once to urinate at night, however   no hx of DM  has been more thirsty   no incontinence   no dysuria / no blood in urine   in past had nl ua and pos cx- is worried that she does have infection   Allergies: 1)  ! Erythromycin 2)  ! Levaquin 3)  ! Cortisone (Cortisone Acetate)  Past History:  Past Medical History: Last updated: 09/26/2008 RESTLESS LEG SYNDROME, HX OF (ICD-V12.49) UNSPECIFIED MYALGIA AND MYOSITIS (ICD-729.1) DEGENERATIVE JOINT DISEASE, MILD (ICD-715.90) INSOMNIA-SLEEP DISORDER-UNSPEC (ICD-307.40) FATIGUE (ICD-780.79) HYPERGLYCEMIA (ICD-790.6) HYPOGLYCEMIA (ICD-251.2), reactive CAROTID BRUIT, LEFT (ICD-785.9) THYROID CANCER, HX OF (ICD-V10.87) ACID REFLUX DISEASE (ICD-530.81) PREMENSTRUAL SYNDROME (ICD-625.4) OSTEOPENIA (ICD-733.90) CELIAC SPRUE Hyperlipidemia  Past Surgical History: Last updated: 09/26/2008 Hysterectomy- partial abdominal 1986 for fibroids Left Lobe Thyroidectomy 2002 for malignant nodule, Dr Constance Holster Breast reduction surgery 1999 Colonoscopy 2009, 2005,2000 neg Arthroscopy bilat for chondromalacia  2002 Endo 2006 duodenitis : NO PMH perioperative bleeding; Endo  2/09 : celiac sprue based on biopsies, Dr Sharlett Iles  Family History: Last updated:  09/26/2008 maternal grandfather  CAD ,elevated lipids maternal grandmother CAD , elevated lipids, [place] paternal grandfather diabetes father renal cancer, colitis  Social History: Last updated: 09/26/2008 Former [employer] only Alcohol use-yes occasional Regular exercise-yes: CVE 4 hrs/week  Risk Factors: Exercise: yes (09/26/2008)  Risk Factors: Smoking Status: never (10/30/2007)  Physical Exam  General:  Well-developed,well-nourished,in no acute distress; alert,appropriate and cooperative throughout examination Head:  normocephalic, atraumatic, and no abnormalities observed.   Eyes:  vision grossly intact, pupils equal, pupils round, and pupils reactive to light.   Mouth:  MMM Neck:  nl rom neck Chest Wall:  No deformities, masses, or tenderness noted. Lungs:  Normal respiratory effort, chest expands symmetrically. Lungs are clear to auscultation, no crackles or wheezes. Heart:  Normal rate and regular rhythm. S1 and S2 normal without gallop, murmur, click, rub .S4 Abdomen:  very slt suprapubic tenderness no rebound or gaurding  soft, non-tender, normal bowel sounds, no distention, no hepatomegaly, and no splenomegaly.   Msk:  no cva tenderness no spinal tenderness neg slr nl rom spine  nl gait  no pain on hip rom  Extremities:  No clubbing, cyanosis, edema, or deformity noted with normal full range of motion of all joints.   Neurologic:  sensation intact to light touch, gait normal, and DTRs symmetrical and normal.   Skin:  Intact without suspicious lesions or rashes Inguinal Nodes:  No significant adenopathy Psych:  normal affect, talkative and pleasant    Impression & Recommendations:  Problem # 1:  BACK PAIN, LUMBAR (ICD-724.2) Assessment  New  with no positional component and nl exam  no hx of kidney stones and clear ua check cx and update  adv heat/ gentle stretching   Orders: UA Dipstick w/o Micro (manual) (81002)  Problem # 2:  URINARY  FREQUENCY (ICD-788.41) Assessment: New  new with clear ua  ? overactive bladder vs infx  pt states she has had clean ua with pos cx in past so will cx urine  adv fluids- water only for now  update when cx returns   Orders: UA Dipstick w/o Micro (manual) (81002)  Complete Medication List: 1)  Paroxetine Hcl 20 Mg Tabs (Paroxetine hcl) .... Take 1 tablet every day 2)  Protonix 40 Mg Tbec (Pantoprazole sodium) .[name].. 1 by mouth bid 3)  Crestor 10 Mg Tabs (Rosuvastatin calcium) .[name].. 1 by mouth once daily, appointment due 4)  Aleve Prn  5)  Lorazepam 0.5 Mg Tabs (Lorazepam) .... Take 1 tab at bedtime as needed 6)  Symbicort 80-4.5 Mcg/act Aero (Budesonide-formoterol fumarate) .... 2 puffs twice daily 7)  Flonase 50 Mcg/act Susp (Fluticasone propionate) .... 2 sprays each nostril daily 8)  Singulair 10 Mg Tabs (Montelukast sodium) .[name].. 1 once daily 9)  Symbicort 160-4.5 Mcg/act Aero (Budesonide-formoterol fumarate) .... 2 puffs every 12 hrs ; gargle & spit after use 10)  Ventolin Hfa 108 (90 Base) Mcg/act Aers (Albuterol sulfate) .[name].. 1-2 puffs every 4 hrs as needed 11)  Trazodone Hcl 50 Mg Tabs (Trazodone hcl) .... 1/2 tab by mouth at bedtime. 12)  Promethazine Vc/codeine 6.25-5-10 Mg/16m Syrp (Phenyleph-promethazine-cod) ..[name]. 1 tsp every 6 hrs as needed  Patient Instructions: 1)  please go into [facility] and take your urine cup with you  2)  ask to be directed to the lab so you can drop it off 3)  here is your order 4)  drink lots of water - avoid caffiene /teas/ soda 5)  if worse symptoms - please call or follow up  6)  will make plan when urine culture returns   Prior Medications: PAROXETINE HCL 20 MG TABS (PAROXETINE HCL) take 1 tablet every day PROTONIX 40 MG  TBEC (PANTOPRAZOLE SODIUM) 1 by mouth bid CRESTOR 10 MG  TABS (ROSUVASTATIN CALCIUM) 1 by mouth once daily, APPOINTMENT DUE ALEVE PRN ()  LORAZEPAM 0.5 MG TABS (LORAZEPAM) Take 1 tab at bedtime as needed SYMBICORT 80-4.5  MCG/ACT AERO (BUDESONIDE-FORMOTEROL FUMARATE) 2 puffs twice daily FLONASE 50 MCG/ACT SUSP (FLUTICASONE PROPIONATE) 2 sprays each nostril daily SINGULAIR 10 MG TABS (MONTELUKAST SODIUM) 1 once daily SYMBICORT 160-4.5 MCG/ACT AERO (BUDESONIDE-FORMOTEROL FUMARATE) 2 puffs every 12 hrs ; gargle & spit after use VENTOLIN HFA 108 (90 BASE) MCG/ACT AERS (ALBUTEROL SULFATE) 1-2 puffs every 4 hrs as needed TRAZODONE HCL 50 MG TABS (TRAZODONE HCL) 1/2 tab by mouth at bedtime. PROMETHAZINE VC/CODEINE 6.25-5-10 MG/5ML SYRP (PHENYLEPH-PROMETHAZINE-COD) 1 tsp every 6 hrs as needed Current Allergies (reviewed today): ! ERYTHROMYCIN ! LEVAQUIN ! CORTISONE (CORTISONE ACETATE)  Laboratory Results   Urine Tests  Date/Time Received: October 17, 2009 10:21 AM  Date/Time Reported: October 17, 2009 10:21 AM   Routine Urinalysis   Color: yellow Appearance: Clear Glucose: negative   (Normal Range: Negative) Bilirubin: negative   (Normal Range: Negative) Ketone: negative   (Normal Range: Negative) Spec. Gravity: <1.005   (Normal Range: 1.003-1.035) Blood: negative   (Normal Range: Negative) pH: 6.0   (Normal Range: 5.0-8.0) Protein: negative   (Normal Range: Negative) Urobilinogen: 0.2   (Normal Range: 0-1) Nitrite: negative   (Normal Range:  Negative) Leukocyte Esterace: negative   (Normal Range: Negative)

## 2010-06-10 NOTE — Procedures (Signed)
Summary: Upper Endoscopy  Patient: Montgomery Rothlisberger Note: All result statuses are Final unless otherwise noted.  Tests: (1) Upper Endoscopy (EGD)   EGD Upper Endoscopy       DONE (C)     [facility]     [place], [place]  [address]           ENDOSCOPY PROCEDURE REPORT           PATIENT:  Ann, Bohne  MR#:  [id number]     BIRTHDATE:  12/27/1950, 89 yrs. old  GENDER:  female           ENDOSCOPIST:  Loralee Pacas. Sharlett Iles, MD, [facility]     Referred by:           PROCEDURE DATE:  11/30/2009     PROCEDURE:  EGD with biopsy, Venia Minks Dilation of Esophagus     ASA CLASS:  Class II     INDICATIONS:  chronic ent problems.??? gerd related           MEDICATIONS:   Fentanyl 50 mcg IV, Versed 6 mg IV     TOPICAL ANESTHETIC:  Exactacain Spray           DESCRIPTION OF PROCEDURE:   After the risks benefits and     alternatives of the procedure were thoroughly explained, informed     consent was obtained.  The [facility] [id number]-H180 [id number] endoscope was     introduced through the mouth and advanced to the second portion of     the duodenum, without limitations.  The instrument was slowly     withdrawn as the mucosa was fully examined.     <<PROCEDUREIMAGES>>           The upper, middle, and distal third of the esophagus were     carefully inspected and no abnormalities were noted. The z-line     was well seen at the GEJ. The endoscope was pushed into the fundus     which was normal including a retroflexed view. The antrum,gastric     body, first and second part of the duodenum were unremarkable. CLO     BX. DONE. AND RANDOM ESOPHAGEAL BIOPSIES EVERY 10 CM.DILATED #21F     MALONEY DILATOR.PASSED EASILY.    Retroflexed views revealed no     abnormalities.    The scope was then withdrawn from the patient     and the procedure completed.           COMPLICATIONS:  None           ENDOSCOPIC IMPRESSION:     1) Normal EGD     NORMAL EXAM.R/O EOSINOPHILIC ESOPHAGITIS,R/O UES  SPASM.     RECOMMENDATIONS:     1) Await biopsy results     SCHEDULE MANOMETRY AND 24H PH PROBE EXAM.           REPEAT EXAM:  No           ______________________________     Loralee Pacas. Sharlett Iles, MD, Marval Regal           CC:  Hendricks Limes, MD, Arville Care MD           n.     REVISED:  11/30/2009 01:57 PM     eSIGNED:   Loralee Pacas. Karanveer Ramakrishnan at 11/30/2009 01:57 PM           Ron Agee, [id number]  Note: An exclamation mark (!) indicates a result that  was not dispersed into the flowsheet. Document Creation Date: 11/30/2009 1:57 PM _______________________________________________________________________  (1) Order result status: Final Collection or observation date-time: 11/30/2009 13:47 Requested date-time:  Receipt date-time:  Reported date-time:  Referring Physician:   Ordering Physician: Verl Blalock [phone]) Specimen Source:  Source: Tawanna Cooler Order Number: [id number] Lab site:

## 2010-06-10 NOTE — Progress Notes (Signed)
Summary: CT Results  Phone Note Outgoing Call Call back at [facility] Phone [phone]   Call placed by: Georgette Dover,  August 25, 2009 4:59 PM Call placed to: Patient Summary of Call: Left message on machine for patient to return call when avaliable, Reason for call:   CAT scan changes suggest inflammatory rather than infectious process. Neti pot once daily as needed & fluticasone nasal spray (or equivalent)recommended 1 spray two times a day as needed.  [place] Initial call taken by: Georgette Dover,  August 25, 2009 4:59 PM  Follow-up for Phone Call        Left message on machine for patient to return call when avaliable, Reason for call:   Radiology Results Follow-up by: Georgette Dover,  August 26, 2009 8:23 AM  Additional Follow-up for Phone Call Additional follow up Details #1::        Spoke with patient, patient ok'd all information Additional Follow-up by: Georgette Dover,  August 26, 2009 4:17 PM

## 2010-06-10 NOTE — Assessment & Plan Note (Signed)
Summary: coughing/drainage/kdc   Vital Signs:  Patient profile:   60 year old female Height:      66.75 inches Weight:      162.12 pounds BMI:     25.67 Temp:     98.1 degrees F BP sitting:   120 / 80  Vitals Entered By: Dawson Bills (June 25, 2009 2:30 PM) CC: acute only Is Patient Diabetic? No Comments  - finished ceftin about 1 week ago  - c/o of PN drip x 5 d  - cough, green mucus x 5 d Dawson Bills  June 25, 2009 2:34 PM    CC:  acute only.  History of Present Illness: Amanda [name] is a 60 year old female who presents today with c/o chest congestion and cough which is productive of green/yellow sputum.  She was seen on 2/2 for sinusitus and was treated with cefdinir.  She initially felt well after completing antibiotics 1 week ago.  5 days ago patient noted cough/congestion.  Notes that her symptoms are worse when she enters her house (coughing/sneezing, increased nasal congestion).  She is not sure if there may be an environmental component to her symptoms.  Denies fever, sinus pressure is better with improvement in her drainage.  Mild fullness in her right ear with occasional right ear pain.  Current Medications (verified): 1)  Paroxetine Hcl 20 Mg Tabs (Paroxetine Hcl) .... Take 1 Tablet Every Day 2)  Protonix 40 Mg  Tbec (Pantoprazole Sodium) .[name].. 1 By Mouth Bid 3)  Crestor 10 Mg  Tabs (Rosuvastatin Calcium) .[name].. 1 By Mouth Once Daily 4)  Aleve Prn 5)  Lorazepam 0.5 Mg Tabs (Lorazepam) .... Take 1 Tab At Bedtime As Needed  Allergies (verified): 1)  ! Erythromycin 2)  ! Levaquin  Physical Exam  General:  Well-developed,well-nourished,in no acute distress; alert,appropriate and cooperative throughout examination Head:  Normocephalic and atraumatic without obvious abnormalities. No apparent alopecia or balding. Eyes:  PERRLA Ears:  TM's dull without significant erythema or bulging. Mouth:  Oral mucosa and oropharynx without lesions or exudates.  Teeth in good  repair. Neck:  No deformities, masses, or tenderness noted. Lungs:  Normal respiratory effort, chest expands symmetrically. Very soft expiratory wheeze.  No crackles, no increased WOB.  Good air mvmt to bases Heart:  Normal rate and regular rhythm. S1 and S2 normal without gallop, murmur, click, rub or other extra sounds.   Impression & Recommendations:  Problem # 1:  ACUTE BRONCHITIS (ICD-466.0) Assessment New Will plan to treat with zithromax and symbicort as I suspect some mild associated reactive airway disease.   The following medications were removed from the medication list:    Ceftin 500 Mg Tabs (Cefuroxime axetil) ..... One tablet by mouth two times a day x 10 days Her updated medication list for this problem includes:    Zithromax Z-pak 250 Mg Tabs (Azithromycin) .[name]... 2 tabs by mouth today, then one tab by mouth daily x 4 more days    Symbicort 80-4.5 Mcg/act Aero (Budesonide-formoterol fumarate) .[name]... 2 puffs twice daily    Tessalon Perles 100 Mg Caps (Benzonatate) ..... One cap by mouth three times a day as needed cough  Problem # 2:  ALLERGIC RHINITIS (ICD-477.9) Assessment: New Plan to treat with flonase.   Her updated medication list for this problem includes:    Flonase 50 Mcg/act Susp (Fluticasone propionate) .[name]... 2 sprays each nostril daily  Complete Medication List: 1)  Paroxetine Hcl 20 Mg Tabs (Paroxetine hcl) .... Take 1 tablet  every day 2)  Protonix 40 Mg Tbec (Pantoprazole sodium) .[name].. 1 by mouth bid 3)  Crestor 10 Mg Tabs (Rosuvastatin calcium) .[name].. 1 by mouth once daily 4)  Aleve Prn  5)  Lorazepam 0.5 Mg Tabs (Lorazepam) .... Take 1 tab at bedtime as needed 6)  Zithromax Z-pak 250 Mg Tabs (Azithromycin) .... 2 tabs by mouth today, then one tab by mouth daily x 4 more days 7)  Symbicort 80-4.5 Mcg/act Aero (Budesonide-formoterol fumarate) .... 2 puffs twice daily 8)  Tessalon Perles 100 Mg Caps (Benzonatate) .... One cap by mouth three times a day as needed  cough 9)  Flonase 50 Mcg/act Susp (Fluticasone propionate) .... 2 sprays each nostril daily  Patient Instructions: 1)  Please call if fever over 101, shortness of breath, increased wheezing. 2)  Please follow up in 1 week.   Prescriptions: FLONASE 50 MCG/ACT SUSP (FLUTICASONE PROPIONATE) 2 sprays each nostril daily  #1 x 1   Entered and Authorized by:   Nance Pear FNP   Signed by:   Nance Pear FNP on 06/25/2009   Method used:   Electronically to        [facility] [phone]* (retail)       [address]       [place], [place]  [address]       Ph: [phone] or [phone]       Fax: [phone]   RxID:   [id number] TESSALON PERLES 100 MG CAPS (BENZONATATE) one cap by mouth three times a day as needed cough  #30 x 0   Entered and Authorized by:   Nance Pear FNP   Signed by:   Nance Pear FNP on 06/25/2009   Method used:   Electronically to        [facility] (retail)       [address]       [place], [place]  [address]       Ph: [phone] or [phone]       Fax: [phone]   RxID:   [id number] ZITHROMAX Z-PAK 250 MG TABS (AZITHROMYCIN) 2 tabs by mouth today, then one tab by mouth daily x 4 more days  #1 x 0   Entered and Authorized by:   Nance Pear FNP   Signed by:   Nance Pear FNP on 06/25/2009   Method used:   Electronically to        [facility] (retail)       [address]       [place], [place]  [address]       Ph: [phone] or [phone]       Fax: [phone]   RxID:   [id number]

## 2010-06-10 NOTE — Consult Note (Signed)
Summary: [facility]  [facility]   Imported By: Edmonia James 05/13/2009 12:21:16  _____________________________________________________________________  External Attachment:    Type:   Image     Comment:   External Document

## 2010-06-10 NOTE — Assessment & Plan Note (Signed)
Summary: coughing up green sputum//lh   Vital Signs:  Patient profile:   60 year old female Weight:      163.6 pounds Temp:     98.2 degrees F oral BP sitting:   120 / 80  (left arm)  Vitals Entered By: Malachi Bonds (June 10, 2009 11:41 AM) CC: sinus congestion and cough    CC:  sinus congestion and cough .  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 Today patient presents with c/o nasal congestion x 1 week, but now with worsening nasal congestion.  has turned green, also + cough which she feels is made worse by post-nasal drip.  Cough worse when laying flat.  Denies associated fever, but had brief episode of chills last night.  Denies visual changes or pain with EOM.  Has not used and OTC meds.  Has used some natural supplement to boost immunity without improvement.    Allergies: 1)  ! Erythromycin 2)  ! Levaquin  Physical Exam  General:  Well-developed,well-nourished,in no acute distress; alert,appropriate and cooperative throughout examination Head:  scar on left side of head- healing well without drainage or erythema Eyes:  PERRLA Ears:  External ear exam shows no significant lesions or deformities.  Otoscopic examination reveals clear canals, tympanic membranes are intact bilaterally without bulging, retraction, inflammation or discharge. Hearing is grossly normal bilaterally. Neck:  No deformities, masses, or tenderness noted. Lungs:  Normal respiratory effort, chest expands symmetrically. Lungs are clear to auscultation, no crackles or wheezes. Heart:  Normal rate and regular rhythm. S1 and S2 normal without gallop, murmur, click, rub or other extra sounds.   Impression & Recommendations:  Problem # 1:  SINUSITIS, ACUTE (ICD-461.9) Assessment New Will treat with Ceftin, patient is already using neti pot and I encouraged her to continue.  She was instructed  to call if  fever  over 101, increasing sinus pressure, pain with eye movement, increased facial tenderness of swelling, or if visual changes.  The following medications were removed from the medication list:    Cefuroxime Axetil 500 Mg Tabs (Cefuroxime axetil) .[name]... 1 two times a day Her updated medication list for this problem includes:    Ceftin 500 Mg Tabs (Cefuroxime axetil) ..... One tablet by mouth two times a day x 10 days  Complete Medication List: 1)  Paroxetine Hcl 20 Mg Tabs (Paroxetine hcl) .... Take 1 tablet every day 2)  Protonix 40 Mg Tbec (Pantoprazole sodium) .[name].. 1 by mouth bid 3)  Crestor 10 Mg Tabs (Rosuvastatin calcium) .[name].. 1 by mouth once daily 4)  Aleve Prn  5)  Lorazepam 0.5 Mg Tabs (Lorazepam) .... Take 1 tab at bedtime as needed 6)  Ceftin 500 Mg Tabs (Cefuroxime axetil) .... One tablet by mouth two times a day x 10 days  Patient Instructions: 1)  Call if you develop fever over 101, increasing sinus pressure, pain with eye movement, increased facial tenderness of swelling, or if you develop visual changes. Prescriptions: CEFTIN 500 MG TABS (CEFUROXIME AXETIL) one tablet by mouth two times a day x 10 days  #20 x 0   Entered and Authorized by:   Nance Pear FNP   Signed by:   Nance Pear FNP on 06/10/2009   Method used:   Electronically to        [facility] [phone]* (retail)       [address]       [place], [place]  [address]       Ph: [phone] or [phone]       Fax: [phone]   RxID:   [id number]

## 2010-06-10 NOTE — Letter (Signed)
Summary: [facility]  [facility]   Imported By: Edmonia James 06/15/2009 08:11:33  _____________________________________________________________________  External Attachment:    Type:   Image     Comment:   External Document

## 2010-06-10 NOTE — Assessment & Plan Note (Signed)
Summary: BAD COUGH/RH..............[name]   Vital Signs:  Patient profile:   60 year old female Weight:      164.4 pounds Temp:     98.4 degrees F oral Pulse rate:   76 / minute Resp:     14 per minute BP sitting:   124 / 80  (left arm) Cuff size:   large  Vitals Entered By: Georgette Dover (July 22, 2009 3:37 PM) CC: Bad Cough, ongoing since 06/2009 Comments REVIEWED MED LIST, PATIENT AGREED DOSE AND INSTRUCTION CORRECT    CC:  Bad Cough and ongoing since 06/2009.  History of Present Illness: She has hacking , NP cough despite 10 days of Cefuroxime & 5 days of Zithromax.She used Symbicort only  twice. Not on ACE-I. PMH of EIB.PMH of "cortisone mania"  Allergies: 1)  ! Erythromycin 2)  ! Levaquin 3)  ! Cortisone (Cortisone Acetate)  Review of Systems General:  Denies chills, fever, and sweats. ENT:  Complains of sinus pressure; denies nasal congestion; No frontal headache, facial pain or purulence. Resp:  Denies chest pain with inspiration, shortness of breath, and wheezing. Allergy:  Complains of sneezing; denies itching eyes.  Physical Exam  General:  well-nourished,in no acute distress; alert,appropriate and cooperative throughout examination Ears:  External ear exam shows no significant lesions or deformities.  Otoscopic examination reveals clear canals, tympanic membranes are intact bilaterally without bulging, retraction, inflammation or discharge. Hearing is grossly normal bilaterally. Nose:  External nasal examination shows no deformity or inflammation. Nasal mucosa are pink and moist without lesions or exudates. Mouth:  Oral mucosa and oropharynx without lesions or exudates.  Teeth in good repair. Lungs:  Normal respiratory effort, chest expands symmetrically. Lungs : low grade rhonchi &  wheezes. Heart:  Normal rate and regular rhythm. S1 and S2 normal without gallop, murmur, click, rub. Cervical Nodes:  No lymphadenopathy noted Axillary Nodes:  No palpable  lymphadenopathy   Impression & Recommendations:  Problem # 1:  COUGH (ICD-786.2) RAD (post infectious)  Complete Medication List: 1)  Paroxetine Hcl 20 Mg Tabs (Paroxetine hcl) .... Take 1 tablet every day 2)  Protonix 40 Mg Tbec (Pantoprazole sodium) .[name].. 1 by mouth bid 3)  Crestor 10 Mg Tabs (Rosuvastatin calcium) .[name].. 1 by mouth once daily 4)  Aleve Prn  5)  Lorazepam 0.5 Mg Tabs (Lorazepam) .... Take 1 tab at bedtime as needed 6)  Symbicort 80-4.5 Mcg/act Aero (Budesonide-formoterol fumarate) .... 2 puffs twice daily 7)  Flonase 50 Mcg/act Susp (Fluticasone propionate) .... 2 sprays each nostril daily 8)  Singulair 10 Mg Tabs (Montelukast sodium) .[name].. 1 once daily 9)  Symbicort 160-4.5 Mcg/act Aero (Budesonide-formoterol fumarate) .... 2 puffs every 12 hrs ; gargle & spit after use 10)  Ventolin Hfa 108 (90 Base) Mcg/act Aers (Albuterol sulfate) .[name].. 1-2 puffs every 4 hrs as needed  Patient Instructions: 1)  Drink  room temp  fluids after coughing; "cough... sip". Prescriptions: VENTOLIN HFA 108 (90 BASE) MCG/ACT AERS (ALBUTEROL SULFATE) 1-2 puffs every 4 hrs as needed  #1 x 2   Entered and Authorized by:   Unice Cobble MD   Signed by:   Unice Cobble MD on 07/22/2009   Method used:   Samples Given   RxID:   [id number] SYMBICORT 160-4.5 MCG/ACT AERO (BUDESONIDE-FORMOTEROL FUMARATE) 2 puffs every 12 hrs ; gargle & spit after use  #1 x 5   Entered and Authorized by:   Unice Cobble MD   Signed by:   Unice Cobble MD on  07/22/2009   Method used:   Samples Given   RxID:   [id number] SINGULAIR 10 MG TABS (MONTELUKAST SODIUM) 1 once daily  #30 x 5   Entered and Authorized by:   Unice Cobble MD   Signed by:   Unice Cobble MD on 07/22/2009   Method used:   Samples Given   RxID:   [id number]

## 2010-06-10 NOTE — Assessment & Plan Note (Signed)
Summary: cough/kdc   Vital Signs:  Patient profile:   60 year old female Weight:      160.2 pounds Temp:     99.0 degrees F oral Pulse rate:   76 / minute Resp:     14 per minute BP sitting:   122 / 80  (left arm) Cuff size:   large  Vitals Entered By: Georgette Dover (August 25, 2009 1:42 PM) CC: Ongoing cough, Cough Comments REVIEWED MED LIST, PATIENT AGREED DOSE AND INSTRUCTION CORRECT   CC:  Ongoing cough and Cough.  History of Present Illness: Minimally productive cough X 2 months , partially responsive to OTC Robitussin , Ventolin MDI & Symbicort.SS pain with inspiration. She describes 2 types of cough:"shallow cough" which is  better with bronchodilator  meds but not this" deep cough".   The patient reports  the major symptom is now the minimally  productive cough; but she also has had  pleuritic chest pain, shortness of breath, exertional dyspnea, chills w/o fever 04/18, and malaise. She  denies wheezing and hemoptysis.  Associated symtpoms include cold/URI symptoms, nasal congestion, and chronic rhinitis.  The patient denies the following symptoms: weight loss, acid reflux symptoms, and peripheral edema .Partially effective prior treatments have included OTC cough medication and other asthma medication.  Risk factors include recurrent sinus infections, history of allergic rhinitis, history of EIB/ asthma, and history of reflux.  No aspiration post SDH. Not on ACE-I. On PPI two times a day as per Dr Constance Holster for 5 years.  Allergies: 1)  ! Erythromycin 2)  ! Levaquin 3)  ! Cortisone (Cortisone Acetate)  Review of Systems Eyes:  Complains of itching; denies discharge, eye pain, and red eye. ENT:  Complains of hoarseness; denies difficulty swallowing, ear discharge, and earache;  facial pain but no purulence. No frontal headaches. Pressure in ears. GI:  Denies indigestion. Allergy:  Denies sneezing.  Physical Exam  General:  well-nourished,in no acute distress; alert,appropriate  and cooperative throughout examination Ears:  External ear exam shows no significant lesions or deformities.  Otoscopic examination reveals clear canals, tympanic membranes are intact bilaterally without bulging, retraction, inflammation or discharge. Hearing is grossly normal bilaterally. Nose:  External nasal examination shows no deformity or inflammation. Nasal mucosa are pink and moist without lesions or exudates. Mouth:  Oral mucosa and oropharynx without lesions or exudates.  Teeth in good repair. Lungs:  Normal respiratory effort, chest expands symmetrically. Lungs are clear to auscultation, no crackles or wheezes. Heart:  Normal rate and regular rhythm. S1 and S2 normal without gallop, murmur, click, rub .S4 Skin:  Intact without suspicious lesions or rashes Cervical Nodes:  No lymphadenopathy noted Axillary Nodes:  No palpable lymphadenopathy   Impression & Recommendations:  Problem # 1:  COUGH (ICD-786.2)  Protracted, minimally productive . R/O ERD or RAD component  Orders: Venipuncture ([address]) Radiology Referral (Radiology) [place]. Referral (Misc. Ref) TLB-CBC Platelet - w/Differential (85025-[facility]CD)  Problem # 2:  ACID REFLUX DISEASE (ICD-530.81)  Her updated medication list for this problem includes:    Protonix 40 Mg Tbec (Pantoprazole sodium) .[name]... 1 by mouth bid  Orders: Venipuncture ([address]) Misc. Referral (Misc. Ref) TLB-CBC Platelet - w/Differential (85025-[facility])  Complete Medication List: 1)  Paroxetine Hcl 20 Mg Tabs (Paroxetine hcl) .... Take 1 tablet every day 2)  Protonix 40 Mg Tbec (Pantoprazole sodium) .[name].. 1 by mouth bid 3)  Crestor 10 Mg Tabs (Rosuvastatin calcium) .[name].. 1 by mouth once daily 4)  Aleve Prn  5)  Lorazepam  0.5 Mg Tabs (Lorazepam) .... Take 1 tab at bedtime as needed 6)  Symbicort 80-4.5 Mcg/act Aero (Budesonide-formoterol fumarate) .... 2 puffs twice daily 7)  Flonase 50 Mcg/act Susp (Fluticasone propionate) .... 2 sprays each nostril  daily 8)  Singulair 10 Mg Tabs (Montelukast sodium) .[name].. 1 once daily 9)  Symbicort 160-4.5 Mcg/act Aero (Budesonide-formoterol fumarate) .... 2 puffs every 12 hrs ; gargle & spit after use 10)  Ventolin Hfa 108 (90 Base) Mcg/act Aers (Albuterol sulfate) .[name].. 1-2 puffs every 4 hrs as needed 11)  Trazodone Hcl 50 Mg Tabs (Trazodone hcl) .... 1/2 tab by mouth at bedtime. 12)  Promethazine Vc/codeine 6.25-5-10 Mg/43m Syrp (Phenyleph-promethazine-cod) ..[name]. 1 tsp every 6 hrs as needed  Patient Instructions: 1)  Resume Symbicort 2 puffs two times a day & Singulair 10 mg once daily . Fill the antibiotic Rx if symptoms progress to include  fever & purulent secretions over the next few days. 2)  Drink as much fluid as you can tolerate for the next few days. 3)  Avoid foods high in acid (tomatoes, citrus juices, spicy foods). Avoid eating within two hours of lying down or before exercising. Do not over eat; try smaller more frequent meals. Elevate head of bed twelve inches when sleeping. Prescriptions: PROMETHAZINE VC/CODEINE 6.25-5-10 MG/5ML SYRP (PHENYLEPH-PROMETHAZINE-COD) 1 tsp every 6 hrs as needed  #120cc x 0   Entered and Authorized by:   WUnice CobbleMD   Signed by:   WUnice CobbleMD on 08/25/2009   Method used:   Print then Give to Patient   RxID:   [id number]

## 2010-06-10 NOTE — Progress Notes (Signed)
Summary: Canceled manometry   Phone Note Call from Patient Call back at Home Phone [phone]   Caller: Patient Call For: Dr. Sharlett Iles Reason for Call: Talk to Nurse Summary of Call: Pt. called the hosp. and canceled her manometry for Monday. Will CB to r/s Initial call taken by: Webb Laws,  January 29, 2010 3:25 PM  Follow-up for Phone Call        noted Follow-up by: Bernita Buffy CMA [facility]),  February 01, 2010 8:21 AM

## 2010-06-10 NOTE — Progress Notes (Signed)
Summary: unable to sleep  Phone Note Call from Patient   Caller: Patient Summary of Call: pt left VM that she has been unable to sleep at night or during the day for the past 5 days. pt states that she even tried to take a hydroncodone pill and still was unable to sleep. pt would like to know if dr Tehya Leath can rx her something to help her sleep. pt states that she is so tired she just wants to sleep. pt is going to the beach this weekend to get away to see if a different environment can help but would like for something the be called in at [facility]  at [place] [phone].. dr Jacey Eckerson pls advise...................Marland KitchenFelecia Deloach CMA  June 05, 2009 2:11 PM    Follow-up for Phone Call        lorazepam 0.5 mg at bedtime as needed #30 Follow-up by: Unice Cobble MD,  June 05, 2009 4:40 PM  Additional Follow-up for Phone Call Additional follow up Details #1::        [facility] [place], left pt detail message rx called in ..........................Marland KitchenFelecia Deloach CMA  June 05, 2009 5:01 PM     New/Updated Medications: LORAZEPAM 0.5 MG TABS (LORAZEPAM) Take 1 tab at bedtime as needed Prescriptions: LORAZEPAM 0.5 MG TABS (LORAZEPAM) Take 1 tab at bedtime as needed  #30 x 0   Entered by:   Rolla Flatten CMA   Authorized by:   Unice Cobble MD   Signed by:   Rolla Flatten CMA on 06/05/2009   Method used:   Telephoned to ...       [facility] [address]* (retail)       [address]       [place], [place]  [address]       Ph: [phone] or [phone]       Fax: [phone]   RxID:   [id number]

## 2010-06-10 NOTE — Letter (Signed)
Summary: Patient [facility] Biopsy Results  [name]  [place], [place] [address]   Phone: [phone]  Fax: [phone]        December 03, 2009 MRN: [id number]    [name], [place]  [address]    Dear Ms. Chermak,  I am pleased to inform you that the biopsies taken during your recent endoscopic examination did not show any evidence of cancer upon pathologic examination.Biopsies were normal.  Additional information/recommendations:  __No further action is needed at this time.  Please follow-up with      your primary care physician for your other healthcare needs.  __ Please call [phone] to schedule a return visit to review      your condition.  _x_ Continue with the treatment plan as outlined on the day of your      exam.  __ You should have a repeat endoscopic examination for this problem              in _ months/years.   Please call us if you are having persistent problems or have questions about your condition that have not been fully answered at this time.  Sincerely,  Sable Feil MD [facility]  This letter has been electronically signed by your physician.  Appended Document: Patient Notice-Endo Biopsy Results letter mailed 8.1.2011

## 2010-06-10 NOTE — Letter (Signed)
Summary: Addendum/[facility]  Addendum/[facility]   Imported By: Phillis Knack 11/05/2009 08:18:52  _____________________________________________________________________  External Attachment:    Type:   Image     Comment:   External Document  Appended Document: Addendum/[facility] Lm for pt to call and sch appt.

## 2010-06-10 NOTE — Assessment & Plan Note (Signed)
Summary: THROAT PAIN/FATIGUE/REFLUX...AS.    History of Present Illness Visit Type: Follow-up Visit Primary GI MD: Verl Blalock MD Oneal Deputy Primary Provider: Marjie Skiff Requesting Provider: n/a Chief Complaint: GERD, throat pain,Voice change, labs normal ordered by [facility] Care History of Present Illness:   60 year old Caucasian female with chronic gastroesophageal reflux disease manifested mostly by extra esophageal symptomatology of hoarseness, throat clearing, globus sensation, and coughing. She's had numerous evaluations by Dr. Linna Darner, [facility], physicians at [facility], and also I performed previous endoscopy with impaired dilations and esophageal biopsies which were negative for eosinophilic esophagitis. She takes Protonix 40 mg twice a day and is still symptomatic. She does have a history of asthma and has been on inhalers in the past. She denies dysphasia, anorexia, weight loss, hepatobiliary, or GI problems.There is no history of Raynaud phenomenon or collagen vascular disease.   GI Review of Systems      Denies abdominal pain, acid reflux, belching, bloating, chest pain, dysphagia with liquids, dysphagia with solids, heartburn, loss of appetite, nausea, vomiting, vomiting blood, weight loss, and  weight gain.        Denies anal fissure, black tarry stools, change in bowel habit, constipation, diarrhea, diverticulosis, fecal incontinence, heme positive stool, hemorrhoids, irritable bowel syndrome, jaundice, light color stool, liver problems, rectal bleeding, and  rectal pain. Preventive Screening-Counseling & Management      Drug Use:  no.      Current Medications (verified): 1)  Paroxetine Hcl 20 Mg Tabs (Paroxetine Hcl) .... Take 1 Tablet Every Day 2)  Protonix 40 Mg  Tbec (Pantoprazole Sodium) .[name].. 1 By Mouth Bid 3)  Crestor 10 Mg  Tabs (Rosuvastatin Calcium) .[name].. 1 By Mouth Once Daily, Appointment Due 4)  Aleve Prn 5)  Ropindale .... For  Restless Leg  Allergies (verified): 1)  ! Erythromycin 2)  ! Levaquin 3)  ! Cortisone (Cortisone Acetate)  Past History:  Past medical, surgical, family and social histories (including risk factors) reviewed for relevance to current acute and chronic problems.  Past Medical History: Reviewed history from 09/26/2008 and no changes required. RESTLESS LEG SYNDROME, HX OF (ICD-V12.49) UNSPECIFIED MYALGIA AND MYOSITIS (ICD-729.1) DEGENERATIVE JOINT DISEASE, MILD (ICD-715.90) INSOMNIA-SLEEP DISORDER-UNSPEC (ICD-307.40) FATIGUE (ICD-780.79) HYPERGLYCEMIA (ICD-790.6) HYPOGLYCEMIA (ICD-251.2), reactive CAROTID BRUIT, LEFT (ICD-785.9) THYROID CANCER, HX OF (ICD-V10.87) ACID REFLUX DISEASE (ICD-530.81) PREMENSTRUAL SYNDROME (ICD-625.4) OSTEOPENIA (ICD-733.90) CELIAC SPRUE Hyperlipidemia  Past Surgical History: Hysterectomy- partial abdominal 1986 for fibroids Left Lobe Thyroidectomy 2002 for malignant nodule, Dr Constance Holster Breast reduction surgery 1999 Colonoscopy 2009, 2005,2000 neg Arthroscopy bilat for chondromalacia  2002 Endo 2006 duodenitis : NO PMH perioperative bleeding; Endo  2/09 : celiac sprue based on biopsies, Dr Sharlett Iles Brain surgery  04/30/2009  Family History: Reviewed history from 09/26/2008 and no changes required. maternal grandfather  CAD ,elevated lipids maternal grandmother CAD , elevated lipids, Alsheimers paternal grandfather diabetes father renal cancer, colitis  Social History: Reviewed history from 09/26/2008 and no changes required. Former Smoker:in high school only Alcohol use-yes occasional Regular exercise-yes: CVE 4 hrs/week Illicit Drug Use - no Drug Use:  no  Review of Systems  The patient denies allergy/sinus, anemia, anxiety-new, arthritis/joint pain, back pain, blood in urine, breast changes/lumps, change in vision, confusion, cough, coughing up blood, depression-new, fainting, fatigue, fever, headaches-new, hearing problems, heart murmur,  heart rhythm changes, itching, menstrual pain, muscle pains/cramps, night sweats, nosebleeds, pregnancy symptoms, shortness of breath, skin rash, sleeping problems, sore throat, swelling of feet/legs, swollen lymph glands, thirst - excessive , urination - excessive ,  urination changes/pain, urine leakage, vision changes, and voice change.    Vital Signs:  Patient profile:   61 year old female Height:      66.75 inches Weight:      160 pounds BMI:     25.34 BSA:     1.84 Pulse rate:   80 / minute Pulse rhythm:   regular BP sitting:   128 / 80  (left arm)  Vitals Entered By: [place] Deborra Medina) (November 24, 2009 11:30 AM)  Physical Exam  General:  Well developed, well nourished, no acute distress.healthy appearing.   Head:  Normocephalic and atraumatic. Eyes:  PERRLA, no icterus.exam deferred to patient's ophthalmologist.   Mouth:  No deformity or lesions, dentition normal. Lungs:  wheezes bilateral.   Heart:  Regular rate and rhythm; no murmurs, rubs,  or bruits. Abdomen:  Soft, nontender and nondistended. No masses, hepatosplenomegaly or hernias noted. Normal bowel sounds. Extremities:  No clubbing, cyanosis, edema or deformities noted. Neurologic:  Alert and  oriented x4;  grossly normal neurologically. Cervical Nodes:  No significant cervical adenopathy. Psych:  Alert and cooperative. Normal mood and affect.   Impression & Recommendations:  Problem # 1:  ACID REFLUX DISEASE (ICD-530.81) Assessment Unchanged Apparently she has had previous manometry and 24-hour pH probe testing, and we will request these records. She seemed to have refractory GERD and may be a good candidate for fundoplication surgery. I changed her Protonix to 40 mg 30 minutes before meals and will repeat her endoscopic exam. It is unclear if manometry has been previously performed.  Problem # 2:  CELIAC DISEASE (ICD-579.0) Assessment: Improved She has gluten sensitivity and actually had a negative small  bowel biopsy a few years ago while on a gluten-free diet. In any case, she is responding well to gluten lavoidance in terms of her other GI symptoms.  Problem # 3:  IRRITABLE BOWEL SYNDROME (ICD-564.1) Assessment: Improved  Patient Instructions: 1)  Please continue current medications.  2)  You are scheduled for an upper endoscopy. 3)  The medication list was reviewed and reconciled.  All changed / newly prescribed medications were explained.  A complete medication list was provided to the patient / caregiver. 4)  Copy sent to : Dr. Unice Cobble and Dr. Johnathan Hausen 5)  Reflux Esophagitis Hernia handout given.   Appended Document: THROAT PAIN/FATIGUE/REFLUX...AS.    Clinical Lists Changes  Orders: Added new Test order of EGD (EGD) - Signed

## 2010-06-10 NOTE — Progress Notes (Signed)
Summary: Request to speak to Dr.Hopper  Phone Note Call from Patient Call back at Work Phone [phone]   Caller: Patient Summary of Call: Request to speak to Dr.Hopper: patient said she would personally like to speak to Dr.Hopper  cause by the grace of God she is still here and she really apperciates Dr.Hopper stopping by the hospital to see her and she would like for to talk to him if she could.   Marland KitchenGeorgette [name]  May 27, 2009 12:05 PM   Follow-up for Phone Call        NS status updayed; she wanted to thank me for coming to see her in hospital Follow-up by: Unice Cobble MD,  May 28, 2009 5:06 PM

## 2010-06-10 NOTE — Letter (Signed)
Summary: [facility]  [facility]   Imported By: Bubba Hales 11/30/2009 09:33:25  _____________________________________________________________________  External Attachment:    Type:   Image     Comment:   External Document

## 2010-06-10 NOTE — Miscellaneous (Signed)
Summary: Orders Update  Clinical Lists Changes  Orders: Added new Test order of T-2 View CXR (71020TC) - Signed 

## 2010-06-10 NOTE — Letter (Signed)
Summary: [facility]  [facility]   Imported By: Bubba Hales 11/30/2009 09:32:28  _____________________________________________________________________  External Attachment:    Type:   Image     Comment:   External Document

## 2010-06-10 NOTE — Op Note (Signed)
Summary: Mariam Dollar MD  Thyroid/Jefry Jerry Caras MD   Imported By: Bubba Hales 11/30/2009 09:27:59  _____________________________________________________________________  External Attachment:    Type:   Image     Comment:   External Document

## 2010-06-10 NOTE — Miscellaneous (Signed)
Summary: Orders Update clo test  Clinical Lists Changes  Orders: Added new Test order of TLB-H Pylori Screen Gastric Biopsy (83013-CLOTEST) - Signed

## 2010-06-11 NOTE — Letter (Signed)
Summary: [facility]  [facility]   Imported By: Bubba Hales 11/30/2009 09:36:27  _____________________________________________________________________  External Attachment:    Type:   Image     Comment:   External Document

## 2010-06-15 ENCOUNTER — Other Ambulatory Visit: Payer: Self-pay | Admitting: Internal Medicine

## 2010-06-15 DIAGNOSIS — K219 Gastro-esophageal reflux disease without esophagitis: Secondary | ICD-10-CM

## 2010-06-22 ENCOUNTER — Other Ambulatory Visit: Payer: Self-pay

## 2010-06-29 ENCOUNTER — Other Ambulatory Visit: Payer: Self-pay | Admitting: Internal Medicine

## 2010-06-29 DIAGNOSIS — K219 Gastro-esophageal reflux disease without esophagitis: Secondary | ICD-10-CM

## 2010-06-30 ENCOUNTER — Ambulatory Visit
Admission: RE | Admit: 2010-06-30 | Discharge: 2010-06-30 | Disposition: A | Discharge status: Home / Self Care | Payer: BC Managed Care – PPO | Source: Ambulatory Visit | Attending: Internal Medicine | Admitting: Internal Medicine

## 2010-06-30 ENCOUNTER — Encounter (INDEPENDENT_AMBULATORY_CARE_PROVIDER_SITE_OTHER): Payer: Self-pay | Admitting: *Deleted

## 2010-06-30 DIAGNOSIS — K219 Gastro-esophageal reflux disease without esophagitis: Secondary | ICD-10-CM

## 2010-07-02 ENCOUNTER — Telehealth: Payer: Self-pay | Admitting: Gastroenterology

## 2010-07-02 ENCOUNTER — Ambulatory Visit (INDEPENDENT_AMBULATORY_CARE_PROVIDER_SITE_OTHER): Payer: BC Managed Care – PPO | Admitting: Gynecology

## 2010-07-02 ENCOUNTER — Other Ambulatory Visit: Payer: BC Managed Care – PPO

## 2010-07-02 DIAGNOSIS — N949 Unspecified condition associated with female genital organs and menstrual cycle: Secondary | ICD-10-CM

## 2010-07-02 DIAGNOSIS — R1031 Right lower quadrant pain: Secondary | ICD-10-CM

## 2010-07-02 DIAGNOSIS — R1900 Intra-abdominal and pelvic swelling, mass and lump, unspecified site: Secondary | ICD-10-CM

## 2010-07-05 ENCOUNTER — Encounter: Payer: Self-pay | Admitting: Physician Assistant

## 2010-07-05 ENCOUNTER — Other Ambulatory Visit: Payer: Self-pay | Admitting: Internal Medicine

## 2010-07-05 ENCOUNTER — Telehealth (INDEPENDENT_AMBULATORY_CARE_PROVIDER_SITE_OTHER): Payer: Self-pay | Admitting: *Deleted

## 2010-07-05 ENCOUNTER — Ambulatory Visit (INDEPENDENT_AMBULATORY_CARE_PROVIDER_SITE_OTHER): Payer: BC Managed Care – PPO | Admitting: Physician Assistant

## 2010-07-05 DIAGNOSIS — K900 Celiac disease: Secondary | ICD-10-CM

## 2010-07-05 DIAGNOSIS — R5381 Other malaise: Secondary | ICD-10-CM

## 2010-07-05 DIAGNOSIS — K219 Gastro-esophageal reflux disease without esophagitis: Secondary | ICD-10-CM

## 2010-07-05 DIAGNOSIS — R5383 Other fatigue: Secondary | ICD-10-CM | POA: Insufficient documentation

## 2010-07-05 DIAGNOSIS — R1013 Epigastric pain: Secondary | ICD-10-CM

## 2010-07-06 NOTE — Progress Notes (Signed)
Summary: Triage   Phone Note Call from Patient Call back at Work Phone [phone]   Caller: Patient Call For: Dr Sharlett Iles Reason for Call: Acute Illness Details for Reason: Triage Summary of Call: Pt c/o of burning sensation in her throat x2 months. Also c/o of lower abd pain and bloating and no energy; Cannot wait until next available appointment. Initial call taken by: Cora Daniels,  July 02, 2010 12:57 PM  Follow-up for Phone Call        Patient c/o burning in her throat x 2 months. She also c/o lower back pain and lower GI pain as well as bloating. Patient saw her GYN today and labs and U/S of pelvic region were normal. Type of foods do not matter and she states she watches her diet d/t Celiac disease. Patient was seen last fall and a Manometry was ordered. Patient stated she cancelled the manometry because she started Aciphex and it helped her problem. Patient remians on Aciphex- not taking the Librax. Patient given an appointment with Nicoletta Ba [facility] for 07/05/10. Follow-up by: Shella Maxim RN,  July 02, 2010 2:47 PM

## 2010-07-06 NOTE — Miscellaneous (Signed)
Summary: UGI Series  Clinical Lists Changes     DG UGI W/High Arminda Resides. - STATUS: Final  IMAGE                                     Perform Date: 22Feb12 08:53  Ordered By: Golden Pop MD , PROVIDER         Ordered Date:  Facility: [facility]                              Department: DG  Service Report Text  [facility] Accession Number: [id number]     Clinical Data:  Acid reflux.    UPPER GI SERIES WITH KUB    Technique:  Routine upper GI series was performed with thin and   high density barium.    Fluoroscopy Time: 1.5 minutes    Comparison:  None.    Findings: Scout view of the abdomen shows stool throughout the   colon.  No unexpected radiopaque calculi.    Double contrast exam of the upper gastrointestinal tract shows   normal esophageal motility.  No esophageal fold thickening,   stricture or obstruction.  There may be a tiny hiatal hernia.   Stomach and duodenal bulb are unremarkable.  A 13 mm barium pill   passed into the stomach without difficulty.    IMPRESSION:    1.  No acute findings.   2.  Constipation.    Original Report Authenticated By: Luretha Rued, M.D.  Additional Information  HL7 RESULT STATUS : F  External IF Update Timestamp : 2010-06-30:08:53:00.000000

## 2010-07-07 ENCOUNTER — Ambulatory Visit (INDEPENDENT_AMBULATORY_CARE_PROVIDER_SITE_OTHER)
Admission: RE | Admit: 2010-07-07 | Discharge: 2010-07-07 | Disposition: A | Discharge status: Home / Self Care | Payer: BC Managed Care – PPO | Source: Ambulatory Visit | Attending: Internal Medicine | Admitting: Internal Medicine

## 2010-07-07 DIAGNOSIS — R109 Unspecified abdominal pain: Secondary | ICD-10-CM

## 2010-07-07 HISTORY — DX: Celiac disease: K90.0

## 2010-07-07 MED ORDER — IOHEXOL 300 MG/ML  SOLN
80.0000 mL | Freq: Once | INTRAMUSCULAR | Status: AC | PRN
  Administered 2010-07-07: 80 mL via INTRAVENOUS

## 2010-07-15 NOTE — Assessment & Plan Note (Addendum)
Summary: Lower abdominal Pain, Throat burning    History of Present Illness Visit Type: Follow-up Visit Primary GI MD: Verl Blalock MD Oneal Deputy Primary Provider: Marjie Skiff Requesting Provider: n/a Chief Complaint: Esophageal Burning and abdominal pain History of Present Illness:   PLEASANT 59 YO FEMALE KNOWN TO DR. PATTERSON. SHE HAS HX OF CELIAC DISEASE , AND CHRONIC GERD, AS WELL AS IBS. SHE UNDERWENT EGD IN 7/11 FOR C/O BURNING ESOPHAGEAL PAIN. SHE HAD A NORMAL EXAM. MULTIPLE BX'S WERE TAKEN OF THE ESOPHAGUS TO R/O EOSINOPHILC ESOPHAGITIS. THESE SHOWED REFLUX RELATED INJURY WITH RARE INTRAEPITHELIAL EOSINOPHIL.  SHE WAS TREATED WITH ACIPHEX WHICH HELPED. COLONOSCOPY NEGATIVE IN 2009. SHE WAS TO HAVE A MANOMETRY  BUT SHE FELT BETTER AND DID NOT SCHEDULE. SHE COMES BACK IN NOW -NOT FEELING WELL AT ALL OVER THE PAST COUPLE MONTHS. SHE HAS HAD A PERVASIVE ONGOING SEVERE FATIGUE THAT CONCERNS HER. SHE DID NOT STAY ON ACIPHEX AS IT WAS TOO EXPENSIVE, HAS BEN ON PREVACID 15 MG DAILY AT NIGHT AND ZANTAC AT BEDTIME. OVER THE PAST 6 WEEKS THIS IS NOT WORKING. SHE C/O DAILY BURNING PAIN,PRESENT WHEN SHE WAKES UP IN THE MORNING. NO DYSPHAGIA,OR ODYNOPHAGIA. APPETITE IS OFF, NO WEIGHT LOSS. SHE HAS A COATING ON HER TONGUE THAT WON'T GO AWAY.  SHE IS ADHERING TO HER GLUTEN FREE DIET . SHE HAS ALSO HAD UPPER ABDOMINAL DISCOMFORT AND VAGUE PAIN IN HER MID BACK. SHE IS MISERABLE AND WORRIED, DOESNT KNOW WHAT IS WRONG WITH HER BODY. SHE HAS HX OF THYROID CA-JUST HAD A THYROID WORKUP PER ENT , AND ALL WAS FINE.   GI Review of Systems    Reports abdominal pain and  acid reflux.      Denies belching, bloating, chest pain, dysphagia with liquids, dysphagia with solids, heartburn, loss of appetite, nausea, vomiting, vomiting blood, weight loss, and  weight gain.        Denies anal fissure, black tarry stools, change in bowel habit, constipation, diarrhea, diverticulosis, fecal incontinence, heme  positive stool, hemorrhoids, irritable bowel syndrome, jaundice, light color stool, liver problems, rectal bleeding, and  rectal pain.    Current Medications (verified): 1)  Paroxetine Hcl 10 Mg Tabs (Paroxetine Hcl) .[name].. 1 By Mouth Once Daily 2)  Crestor 10 Mg  Tabs (Rosuvastatin Calcium) .... **appointment Due** 1 By Mouth Once Daily 3)  Aleve Prn 4)  Ropindale .... For Restless Leg 5)  Prevacid 24hr 15 Mg Cpdr (Lansoprazole) .[name].. 1 By Mouth Once Daily  Allergies: 1)  ! Erythromycin 2)  ! Levaquin 3)  ! Cortisone (Cortisone Acetate) 4)  ! * Ambien  Past History:  Past Medical History: RESTLESS LEG SYNDROME, HX OF (ICD-V12.49) UNSPECIFIED MYALGIA AND MYOSITIS (ICD-729.1) DEGENERATIVE JOINT DISEASE, MILD (ICD-715.90) INSOMNIA-SLEEP DISORDER-UNSPEC (ICD-307.40) FATIGUE (ICD-780.79) HYPERGLYCEMIA (ICD-790.6) HYPOGLYCEMIA (ICD-251.2), reactive CAROTID BRUIT, LEFT (ICD-785.9) THYROID CANCER, HX OF (ICD-V10.87) ACID REFLUX DISEASE (ICD-530.81) PREMENSTRUAL SYNDROME (ICD-625.4) OSTEOPENIA (ICD-733.90) CELIAC SPRUE HYPERLIPIDEMIA HX OF SUBDURAL HEMATOMA  Past Surgical History: Hysterectomy- partial abdominal 1986 for fibroids Left Lobe Thyroidectomy 2002 for malignant nodule, Dr Constance Holster Breast reduction surgery 1999 Colonoscopy 2009, 2005,2000 neg Arthroscopy bilat for chondromalacia  2002 Endo 2006 duodenitis : NO PMH perioperative bleeding; Endo  2/09 : celiac sprue based on biopsies, Dr Sharlett Iles. EGD 7/11 Brain surgery  04/30/2009   Family History: Reviewed history from 09/26/2008 and no changes required. maternal grandfather  CAD ,elevated lipids maternal grandmother CAD , elevated lipids, Alsheimers paternal grandfather diabetes father renal cancer, colitis  Social History: Reviewed history from  11/24/2009 and no changes required. Former Smoker:in high school only Alcohol use-yes occasional Regular exercise-yes: CVE 4 hrs/week Illicit Drug Use - no  Review of  Systems       The patient complains of fatigue.  The patient denies allergy/sinus, anemia, anxiety-new, arthritis/joint pain, back pain, blood in urine, breast changes/lumps, change in vision, confusion, cough, coughing up blood, depression-new, fainting, fever, headaches-new, hearing problems, heart murmur, heart rhythm changes, itching, menstrual pain, muscle pains/cramps, night sweats, nosebleeds, pregnancy symptoms, shortness of breath, skin rash, sleeping problems, sore throat, swelling of feet/legs, swollen lymph glands, thirst - excessive , urination - excessive , urination changes/pain, urine leakage, vision changes, and voice change.         SEE HPI  Vital Signs:  Patient profile:   60 year old female Height:      66.75 inches Weight:      165 pounds BMI:     26.13 BSA:     1.86 Pulse rate:   80 / minute Pulse rhythm:   regular BP sitting:   122 / 70  (left arm)  Vitals Entered By: August (Nash) (July 05, 2010 1:25 PM)  Physical Exam  General:  Well developed, well nourished, no acute distress. Head:  Normocephalic and atraumatic. Eyes:  PERRLA, no icterus. Lungs:  FEW SCATTERED RHONCHI. Heart:  Regular rate and rhythm; no murmurs, rubs,  or bruits. Abdomen:  SOFT, MINIMAL TENDERNESS  EPIGASTRIUM, NO GUARDING, NO MASS OR HSM,BS+ Rectal:  NOT DONE Extremities:  No clubbing, cyanosis, edema or deformities noted. Neurologic:  Alert and  oriented x4;  grossly normal neurologically. Psych:  Alert and cooperative. Normal mood and affect.   Impression & Recommendations:  Problem # 1:  ESOPHAGEAL REFLUX (ICD-530.81) Assessment Deteriorated 59 YO FEMALE WITH CHRONIC GERD,PARTIALLY RESPONSIVE TO PPI THERAPY. PT WITH WORSENING SXS X 6 WEEKS DESPITE  DAILY PPI AND H2 BLOCKER.   R/O REFLUX RELATED ESOPHAGITIS ,?EOSINOPHILIC ESOPHAGITIS COMPONENT .    SCHEDULE MANOMETRY TO R/O MOTILITY DISORDER SWITH BACK TO ACIPHEX 20 MG TWICE DAILY-SAMPLES PROVIDED, AND WILL TRY  TO GET PREAUTH  SINCE ACIPHEX WORKS BESTFOR HER. CONTINUE ANTIREFLUX REGIMEN TRIAL OF MAGIC MOUTHWASH 5 CC 4 X DAILY X 2 WEEKS  FOR  ?ORAL CANDIDIASIS. FOLLOW UP WITH DR. PATTERSON IN 3 WEEKS  Problem # 2:  EPIGASTRIC PAIN (ICD-789.06) Assessment: Deteriorated  2 MONTH HX OF VAGUE EPIGASTRIC PAIN, SEVERE  FATIGUE, BACK PAIN-ETIOLOGY UNCLEAR. WITH CELIAC DISEASE  WILL R/O LYMPHOMA  SCHEDULE CT OF ABDOMEN/PELVIS Orders: CT Abdomen/Pelvis with Contrast (CT Abd/Pelvis w/con)  Problem # 3:  CELIAC DISEASE (ICD-579.0) Assessment: Comment Only CONTINUE GLUTEN FREE DIET, PT IS COMPLIANT Orders: CT Abdomen/Pelvis with Contrast (CT Abd/Pelvis w/con)  Problem # 4:  HYPERLIPIDEMIA (ICD-272.4) Assessment: Comment Only  Other Orders: Manometry (Manometry)  Patient Instructions: 1)  Aciphex sampes given. Take 1 tab twice daily, 30 min before breakfast and dinner.   2)  I will call the [place] to do a Prior Authorization so that hopefully  they will approve the Aciphex for you. I will let you know once I get a response from them. 3)  We scheduled the CT scan for Wed 07-07-2010. 4)  Directions and contrast provided for you. 5)  We scheduled the Esophageal Manometry for 08-02-2010. 6)  Directions provided. 7)  Follow up with Dr Sharlett Iles for 4-6 weeks.  We need the CT and Manometry results before you see Dr. Sharlett Iles. 8)  Copy sent to :  Unice Cobble,  [place] Prescriptions: MAGIC MOUTHWASH take 5 cc and swish and swallow 4 times daily x 14 days  #280 cc x 0   Entered by:   Marisue Humble NCMA   Authorized by:   Alfredia Ferguson PA-c   Signed by:   Alfredia Ferguson PA-c on 07/05/2010   Method used:   Telephoned to ...       [facility] [address]* (retail)       [address]       [place], [place]  [address]       Ph: [phone] or [phone]       Fax: [phone]   RxID:   [id number]

## 2010-07-15 NOTE — Progress Notes (Signed)
Summary: Prior Authorization-Aciphex   Phone Note Outgoing Call   Call placed by: Sharol Roussel,  July 05, 2010 3:39 PM Call placed to: Insurer Summary of Call: Desma Paganini [place] [employer] at [phone].  They are faxing me a form to fill out for the Prior Authorization on Aciphex 20 mg prescription for this pt.  I was told once I completely fill out the form and fax it back there is  a one day turn around time for an answer on this as to whether they will cover the Aciphex RX for the pt. Initial call taken by: Sharol Roussel,  July 05, 2010 3:42 PM  Follow-up for Phone Call        Received answer back from [facility] and the Aciphex 20 mg is covered under the pt's insurance until 2014.  The pharmacy did fill it and the pt has picked the prescription up. Follow-up by: Sharol Roussel,  July 07, 2010 3:55 PM

## 2010-07-26 LAB — URINALYSIS, ROUTINE W REFLEX MICROSCOPIC
Bilirubin Urine: NEGATIVE
Hgb urine dipstick: NEGATIVE
Nitrite: NEGATIVE
Specific Gravity, Urine: 1.013 (ref 1.005–1.030)
Urobilinogen, UA: 0.2 mg/dL (ref 0.0–1.0)
pH: 8 (ref 5.0–8.0)

## 2010-07-26 LAB — COMPREHENSIVE METABOLIC PANEL
ALT: 25 U/L (ref 0–35)
AST: 29 U/L (ref 0–37)
Alkaline Phosphatase: 75 U/L (ref 39–117)
CO2: 26 mEq/L (ref 19–32)
Calcium: 9.8 mg/dL (ref 8.4–10.5)
Chloride: 108 mEq/L (ref 96–112)
GFR calc Af Amer: >60 mL/min (ref >60)
GFR calc non Af Amer: >60 mL/min (ref >60)
Glucose, Bld: 147 mg/dL — ABNORMAL HIGH (ref 70–99)
Sodium: 143 mEq/L (ref 135–145)
Total Bilirubin: 0.6 mg/dL (ref 0.3–1.2)

## 2010-07-26 LAB — POCT I-STAT, CHEM 8
BUN: 19 mg/dL (ref 6–23)
Calcium, Ion: 1.14 mmol/L (ref 1.12–1.32)
HCT: 41 % (ref 36.0–46.0)
Sodium: 143 mEq/L (ref 135–145)

## 2010-07-26 LAB — CBC
Hemoglobin: 13.3 g/dL (ref 12.0–15.0)
MCHC: 34 g/dL (ref 30.0–36.0)
RBC: 4.27 MIL/uL (ref 3.87–5.11)
WBC: 6.5 10*3/uL (ref 4.0–10.5)

## 2010-07-26 LAB — DIFFERENTIAL
Basophils Absolute: 0 10*3/uL (ref 0.0–0.1)
Basophils Relative: 0 % (ref 0–1)
Eosinophils Absolute: 0 10*3/uL (ref 0.0–0.7)
Eosinophils Relative: 1 % (ref 0–5)
Neutrophils Relative %: 76 % (ref 43–77)

## 2010-07-26 LAB — LIPASE, BLOOD: Lipase: 29 U/L (ref 11–59)

## 2010-07-30 ENCOUNTER — Encounter: Payer: Self-pay | Admitting: Gastroenterology

## 2010-07-30 NOTE — Telephone Encounter (Signed)
ERROR

## 2010-08-02 ENCOUNTER — Ambulatory Visit (HOSPITAL_COMMUNITY)
Admission: RE | Admit: 2010-08-02 | Discharge: 2010-08-02 | Disposition: A | Discharge status: Home / Self Care | Payer: BC Managed Care – PPO | Source: Ambulatory Visit | Attending: Internal Medicine | Admitting: Internal Medicine

## 2010-08-02 DIAGNOSIS — K219 Gastro-esophageal reflux disease without esophagitis: Secondary | ICD-10-CM

## 2010-08-02 DIAGNOSIS — E041 Nontoxic single thyroid nodule: Secondary | ICD-10-CM

## 2010-08-04 ENCOUNTER — Other Ambulatory Visit (HOSPITAL_COMMUNITY)
Admission: RE | Admit: 2010-08-04 | Discharge: 2010-08-04 | Disposition: A | Discharge status: Home / Self Care | Payer: BC Managed Care – PPO | Source: Ambulatory Visit | Attending: Interventional Radiology | Admitting: Interventional Radiology

## 2010-08-04 ENCOUNTER — Ambulatory Visit
Admit: 2010-08-04 | Discharge: 2010-08-04 | Disposition: A | Discharge status: Home / Self Care | Payer: BC Managed Care – PPO | Attending: Otolaryngology | Admitting: Otolaryngology

## 2010-08-04 ENCOUNTER — Other Ambulatory Visit: Payer: Self-pay | Admitting: Interventional Radiology

## 2010-08-04 DIAGNOSIS — E049 Nontoxic goiter, unspecified: Secondary | ICD-10-CM | POA: Insufficient documentation

## 2010-08-05 ENCOUNTER — Telehealth: Payer: Self-pay | Admitting: *Deleted

## 2010-08-05 ENCOUNTER — Encounter (INDEPENDENT_AMBULATORY_CARE_PROVIDER_SITE_OTHER): Payer: BC Managed Care – PPO

## 2010-08-05 DIAGNOSIS — IMO0001 Reserved for inherently not codable concepts without codable children: Secondary | ICD-10-CM

## 2010-08-05 DIAGNOSIS — M899 Disorder of bone, unspecified: Secondary | ICD-10-CM

## 2010-08-05 NOTE — Telephone Encounter (Signed)
Stephanie from Endo at [facility] called to report the Manometry order was placed under Dr Blanch Media name instead of Dr Sharlett Iles. Please send a new order. Fax [phone]

## 2010-08-09 LAB — CBC
HCT: 37.4 % (ref 36.0–46.0)
MCV: 90.3 fL (ref 78.0–100.0)
Platelets: 243 10*3/uL (ref 150–400)
WBC: 7 10*3/uL (ref 4.0–10.5)

## 2010-08-09 LAB — BASIC METABOLIC PANEL
BUN: 15 mg/dL (ref 6–23)
Chloride: 104 mEq/L (ref 96–112)
Glucose, Bld: 102 mg/dL — ABNORMAL HIGH (ref 70–99)
Potassium: 3.4 mEq/L — ABNORMAL LOW (ref 3.5–5.1)

## 2010-08-09 LAB — MRSA PCR SCREENING: MRSA by PCR: NEGATIVE

## 2010-08-16 LAB — DIFFERENTIAL
Eosinophils Absolute: 0.1 10*3/uL (ref 0.0–0.7)
Eosinophils Relative: 1 % (ref 0–5)
Lymphs Abs: 2.2 10*3/uL (ref 0.7–4.0)
Monocytes Absolute: 0.5 10*3/uL (ref 0.1–1.0)
Monocytes Relative: 7 % (ref 3–12)

## 2010-08-16 LAB — POCT CARDIAC MARKERS
CKMB, poc: <1 ng/mL — ABNORMAL LOW (ref 1.0–8.0)
Myoglobin, poc: 33.8 ng/mL (ref 12–200)

## 2010-08-16 LAB — BASIC METABOLIC PANEL
BUN: 16 mg/dL (ref 6–23)
Chloride: 101 mEq/L (ref 96–112)
Potassium: 3.7 mEq/L (ref 3.5–5.1)
Sodium: 144 mEq/L (ref 135–145)

## 2010-08-16 LAB — CBC
HCT: 39.8 % (ref 36.0–46.0)
Hemoglobin: 13.5 g/dL (ref 12.0–15.0)
MCV: 90 fL (ref 78.0–100.0)
Platelets: 246 10*3/uL (ref 150–400)
RBC: 4.42 MIL/uL (ref 3.87–5.11)
WBC: 7.1 10*3/uL (ref 4.0–10.5)

## 2010-08-16 LAB — URINALYSIS, ROUTINE W REFLEX MICROSCOPIC
Nitrite: NEGATIVE
Specific Gravity, Urine: 1.005 (ref 1.005–1.030)
Urobilinogen, UA: 0.2 mg/dL (ref 0.0–1.0)
pH: 7.5 (ref 5.0–8.0)

## 2010-08-16 LAB — URINE CULTURE: Culture: NO GROWTH

## 2010-08-17 ENCOUNTER — Telehealth: Payer: Self-pay | Admitting: Physician Assistant

## 2010-08-17 ENCOUNTER — Encounter: Payer: Self-pay | Admitting: Gastroenterology

## 2010-08-17 NOTE — Telephone Encounter (Signed)
Left message for patient to call back  

## 2010-08-17 NOTE — Progress Notes (Signed)
  Subjective:    Patient ID: Amanda [name], female    DOB: [birth year], 60 y.o.   MRN: [id number]  HPI esophageal manometry performed in a 60 year old Caucasian female with refractory acid reflux. This was performed on 08/02/2010. Results as of follows:  #1 lower esophageal sphincter-mean pressure was normal at 29 mm of mercury with normal relaxation to swallowing.  #2-upper esophageal sphincter-there is normal coordination between pharyngeal contraction and cricopharyngeal relaxation.  #3 esophageal motility-there is normal peristalsis throughout yo wet and dry swallows with a mean amplitude of contraction  at99 mm of mercury.  Review of Systems    Objective:   Physical Exam        Assessment & Plan:  This is a normal soft tissue manometry without evidence of an esophageal motility disorder. This patient would be a good candidate for fundoplication surgery per her refractory acid reflux. His copy this report to Dr. Johnathan Hausen at [facility] surgery. Also she needs appointment for referral to Dr. Hassell Done for consideration of fundoplication. In the interim, she is to continue her current medications and reflex regime.

## 2010-08-17 NOTE — Telephone Encounter (Signed)
Spoke with patient and told her Dr. Sharlett [name] is recommending she get a surgical referral for esophageal motility disorder. Explained that Amanda [name] is working on this. Patient states she wants an new rx for Aciphex. States Amanda [name], [place] had her start taking it BID. She had an old rx from Dr. Minna [name] for daily. Pharmacy- [facility]. Please, advise

## 2010-08-18 NOTE — Telephone Encounter (Signed)
Mearl Latin to take care of the rx for

## 2010-08-19 ENCOUNTER — Other Ambulatory Visit: Payer: Self-pay | Admitting: *Deleted

## 2010-08-19 ENCOUNTER — Other Ambulatory Visit: Payer: Self-pay | Admitting: Gastroenterology

## 2010-08-19 DIAGNOSIS — K449 Diaphragmatic hernia without obstruction or gangrene: Secondary | ICD-10-CM

## 2010-08-19 DIAGNOSIS — K219 Gastro-esophageal reflux disease without esophagitis: Secondary | ICD-10-CM

## 2010-08-19 MED ORDER — RABEPRAZOLE SODIUM 20 MG PO TBEC: 20.0000 mg | DELAYED_RELEASE_TABLET | Freq: Two times a day (BID) | ORAL | Status: DC

## 2010-08-20 ENCOUNTER — Telehealth: Payer: Self-pay | Admitting: *Deleted

## 2010-08-20 NOTE — Telephone Encounter (Signed)
Received a voice mail from [facility] that patient is scheduled to see Dr. Hassell Done on Sep 09, 2010 at 11:40 AM with an arrival at 11:15 AM. [facility] wants [place] to contact the patient with appt. Left a message at patient's home and cell number to call me.

## 2010-08-23 ENCOUNTER — Telehealth: Payer: Self-pay | Admitting: *Deleted

## 2010-08-23 NOTE — Telephone Encounter (Signed)
Spoke with patient and gave her the appointment date and time with Dr. Hassell Done Sep 09, 2010 arrive at 11:15 AM for 11:40 AM appointment

## 2010-08-23 NOTE — Telephone Encounter (Signed)
Left message for patient to call me at her home and cell number.

## 2010-09-21 NOTE — Assessment & Plan Note (Signed)
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 Linna Darner for evaluation of rectal bleeding and chronic  reflux symptoms.   Amanda [name] has a long history of acid reflux and IBS and has been  evaluated by Dr. Juanita Craver for the last several years with 2  endoscopies and a colonoscopy last done 5 years ago. She does appear to  have been treated for acid reflux for many years, and currently is on  Protonix 40 mg twice a day. She gives no history of dysphagia or  Raynaud's phenomenon. Her most recent problems have been abdominal gas,  bloating, crampy lower abdominal pain, and passage of mucus and blood in  her stools with rather severe fatigue over the last 6-8 weeks. She is  status post left lobe thyroidectomy in 2002 for malignant nodule by Dr.  Constance Holster, and Dr. Linna Darner recently checked multiple thyroid function tests  and antibodies which were all normal and her hemoglobin was normal.  Initial labs showed normal sed rate and CPK.   The patient denies melena but has had some black stools. She denies  upper GI or hepatobiliary complaints. Her appetite is good, but she has  lost approximately 6 pounds in weight over the last month. She has had  no nausea or vomiting or symptoms suggestive of gastroparesis. Last  gallbladder workup was several years ago by Dr. Collene Mares including a normal  ultrasound and CCK HIDA scan. Actually, this was done in July 2006.   The patient denies any specific food intolerances; been pretty much on a  low-fiber diet with avoidance of lactose for the last month. Additional  complaints consist of drainage from her low back area for many years of  a foul-smelling liquid material. Gives  no history of lumbosacral spine  surgery.   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 She has also had  bilateral arthroscopic knee surgeries and tonsillectomy.   MEDICATIONS:  1. Paxil 20 mg at bedtime.  2. Protonix 40 mg twice a day.  3. Fosamax weekly.  4. Crestor daily.  5. Aspirin 81 mg a day.  6. Multivitamins daily.  7. Vitamin D daily.  8. Cranberry capsule daily.   She, in the past, has had urticaria with DILANTIN, a seizure with  LEVAQUIN use, has had nausea and vomiting with ERYTHROMYCIN.   FAMILY HISTORY:  Diverticulosis in her father but no other known  gastrointestinal problems.   SOCIAL HISTORY:  She is married, lives with her husband. 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 She does not smoke and just  uses one glass of wine a day.   REVIEW OF SYSTEMS:  Positive for rather diffuse arthralgias with  negative recent rheumatoid factor. She  has had some recent anxiety about  her ill mother. She also has chronic low back pain and rather marked  fatigue at this time. There has been no new skin rashes, mouth sores,  fever, or chills. She denies any current cardiovascular, pulmonary,  genitourinary, neurologic, or other neuropsychiatric problems.   PHYSICAL EXAMINATION:  She is an attractive, healthy-appearing white  female appearing younger than her stated age. She is 5 feet 5-1/2 inches  and weighs 160 pounds. Blood pressure is 110/70, and pulse was 76 and  regular. I could not appreciate stigmata of chronic liver disease or  thyromegaly. She did have a thyroidectomy scar transversed at the neck  area. Her chest was entirely clear, and she was in a regular rhythm  without murmurs, gallops, or rubs. There was no hepatosplenomegaly,  abdominal masses, or significant tenderness. Her upper extremities  were  unremarkable. Mental status was clear. Inspection of the rectum did show  a dry-appearing fistulous tract at the end of her spine without redness,  fluctuation, etc. The perianal area and rectum generally appeared normal  as was the rectal exam with hard formed stool in the rectal vault that  was guaiac negative.   ASSESSMENT:  1. Chronic gastroesophageal reflux disease, doing well on twice a day      proton pump inhibitor therapy.  2. Intermittent rectal bleeding. Rule out rectosigmoid carcinoma.  3. Symptoms certainly suggestive of chronic irritable bowel syndrome.      Rule out celiac disease.  4. 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 It certainly does      not sound like she has had spina bifida or other neurological      problems.  5. Status post hysterectomy.  6. Status post partial thyroidectomy for thyroid cancer.  7. History of hypercholesterolemia.  8. History of arthroscopic knee surgery.  9. Chronic anxiety syndrome with element of depression, on Paxil.   RECOMMENDATIONS:  1. Repeat labs, and we will check anemia profile and celiac panel.  2. Outpatient endoscopy and colonoscopy at her convenience.  3. Continue reflux regime and twice a day Paxil.  4. Consider surgical consultation for her back abnormality.  5. Continue all other medications as per Dr. Linna Darner.     Loralee Pacas. Sharlett Iles, MD, [facility], [place]  Electronically Signed    DRP/MedQ  DD: 08/02/2007  DT: 08/02/2007  Job #: [id number]   cc:   Darrick Penna. Linna Darner, MD,FACP,FCCP

## 2010-09-24 NOTE — Letter (Signed)
April 18, 2006    Larey Seat, M.D.  [address] [place], [place] [address]   RE:  KATLYNNE, MCKERCHER  MRN:  [id number]  /  DOB:  [birth year]   Dear Asencion Partridge,   You are scheduled to see Bryelle Spiewak on June 08, 2006 at 2:00.   She was seen April 11, 2006 with acute balance dysfunction manifested  by dizziness regardless of position.  She stated she had had two  episodes in the previous months.  Subsequent to the events, she has been  dazed and tired.  There have been no seizure stigmata.  She denies any  definite cardiac dysrhythmia.   Her symptoms will last approximately a minute and are not associated  with true vertigo.  It is difficult for her to qualify the symptoms.  She states, Something is radiating out over the back of my head.  She  also may have a sensation of having been slapped over the back of the  head.  She will have fatigue for the rest of the day.   She has researched WebMD and is concerned she is having TIAs  as a  friend has experienced.   Blood pressure was 130/100.  She had no localizing neurologic symptoms.  Romberg testing was negative.  She does have a grade 1 systolic murmur,  with radiation to the left neck.   Pending are fasting lipids and glucose.   For additional history, please see my dictation concerning her visit of  November 16, 2005.  At that time, she was having symptoms of fatigue,  headache, and chest pain.   I appreciate your evaluation and recommendations.  I will defer  performing an EEG or CNS imaging to you.    Sincerely,      Darrick Penna. Linna Darner, MD,FACP,FCCP  Electronically Signed    WFH/MedQ  DD: 04/18/2006  DT: 04/18/2006  Job #: [id number]

## 2010-09-24 NOTE — Procedures (Signed)
[place]  Patient:    Amanda [name]                    MRN: [id number] Proc. Date: 04/22/99 Adm. Date:  [id number] Attending:  Juanita Craver CC:         Sharene Butters, M.D.                           Procedure Report  DATE OF BIRTH:  [birth year]  REFERRING PHYSICIAN:  Sharene Butters, M.D.  PROCEDURE PERFORMED:  Esophagogastroduodenoscopy.  ENDOSCOPIST:  Nelwyn Salisbury, M.D.  INSTRUMENT USED:  Olympus video panendoscope.  INDICATIONS FOR PROCEDURE:  Epigastric pain and family history of stomach cancer in her father in a 84 year old white female, rule out peptic ulcer disease, esophagitis, gastritis, etc.  PREPROCEDURE PREPARATION:  Informed consent was procured from the patient.  The  patient was fasted for eight hours prior to the procedure.  PREPROCEDURE PHYSICAL:  The patient had stable vital signs.  Neck supple. Chest clear to auscultation.  S1, S2 regular.  Abdomen soft with normal abdominal bowel sounds.  DESCRIPTION OF PROCEDURE:  The patient was placed in left lateral decubitus position and sedated with 40 mg of Demerol and 6 mg of Versed intravenously. Once the patient was adequately sedated and maintained on low-flow oxygen and continuous cardiac monitoring, the Olympus video panendoscope was advanced through the mouthpiece over the tongue into the esophagus under direct vision.  The entire esophagus appeared normal without evidence of ring, stricture, masses, lesions r esophagitis.  The scope was then advanced to the stomach.  There was residual debris seen in the stomach on the greater curvature, in the high fundus and cardia and the antrum and some in the duodenal bulb.  No frank ulcers, erosions, masses, polyps or gastritis was seen.  The duodenal bulb and the small bowel distal to he bulb appeared normal.  There was some debris in the duodenal bulb as mentioned above.  IMPRESSION: 1. Question  gastroparesis, residual debris in the stomach. 2. Normal-appearing esophagus, gastric mucosa and proximal small bowel mucosa.  RECOMMENDATIONS: 1. Check hemoglobin A1C today. 2. Reglan 10 mg half hour before lunch and dinner has been advised. 3. Continue Prevacid. 4. Avoid nonsteroidals. 5. Follow-up in the office in the next two weeks.  IMPRESSION:  Question DD:  04/22/99 TD:  04/22/99 Job: [id number]

## 2010-09-24 NOTE — Op Note (Signed)
Amanda [name], Amanda [name]            ACCOUNT NO.:  [id number]   MEDICAL RECORD NO.:  [id number]          PATIENT TYPE:  AMB   LOCATION:  ENDO                         FACILITY:  [facility]   PHYSICIAN:  Nelwyn Salisbury, M.D.  DATE OF BIRTH:  11-24-1950   DATE OF PROCEDURE:  03/26/2004  DATE OF DISCHARGE:                                 OPERATIVE REPORT   PROCEDURE PERFORMED:  Screening colonoscopy.   ENDOSCOPIST:  Nelwyn Salisbury, M.D.   INSTRUMENT USED:  Olympus video colonoscope.   INDICATIONS FOR PROCEDURE:  [name] with a personal  history of thyroid cancer resulting in a left lobe lobectomy in 2003, now  undergoing a screening colonoscopy to rule out colonic polyps, masses, etc.   PREPROCEDURE PREPARATION:  Informed consent was procured from the patient.  The patient was fasted for 8 hours prior to the procedure and prepped with a  bottle of magnesium citrate and a gallon of GoLYTELY the night prior to the  procedure. All the risks and benefits of the procedure including a 10%  missed rate in colon polyps or cancer was discussed with the patient in  great detail.   PREPROCEDURE PHYSICAL:  VITAL SIGNS:  The patient has stable vital signs.  NECK:  Supple.  CHEST:  Clear to auscultation. S1 and S2 regular.  ABDOMEN:  Soft with normal bowel sounds.   DESCRIPTION OF PROCEDURE:  The patient was placed in a left lateral  decubitus position and sedated with 75 mg of Demerol and 5 mg of Versed in  slow incremental doses. Once the patient was adequately sedated and  maintained on low flow oxygen and continuous cardiac monitoring, the Olympus  video colonoscope was advanced from the rectum to the cecum without  difficulty. The appendiceal orifice and ileocecal valve were clearly  visualized and photographed.  No masses or polyps were identified. There was  no evidence of diverticulosis. Small internal hemorrhoids were seen on  retroflexion. There was some residual stool  in the colon, and multiple  washes were done.   IMPRESSION:  Normal colonoscopy up to the cecum except for small internal  hemorrhoids. No masses, polyps, or diverticula seen.   RECOMMENDATIONS:  1.  Continue a high-fiber diet with liberal fluid intake.  2.  Repeat colonoscopy in the next 5 years unless the patient develops      abnormal symptoms in the interim.  3.  Outpatient followup as needed in the future.       JNM/MEDQ  D:  03/26/2004  T:  03/27/2004  Job:  [id number]   cc:   Darrick Penna. Linna Darner, M.D. [facility]   Daniel L. Cherylann Banas, M.D.  [address] [place]  [place]  [place] [address]  Fax: [phone]

## 2010-09-24 NOTE — Assessment & Plan Note (Signed)
[facility] OFFICE NOTE   NAME:Amanda [name], Amanda [name]                   MRN:          [id number]  DATE:09/05/2006                            DOB:          12/30/1950    Amanda [name] was seen September 05, 2006 for repeat blood work to  evaluate mild hyperglycemia with a high-normal A1c.   Please see April 18, 2006 dictation for prior evaluation.  She was  seen by Dr. Brett Fairy and apparently EEG performed.  The sleep study was  recommended but not completed.  I do not have a copy of that evaluation  but will request that.   She did not complete the sleep study as she had begun to restrict sugars  and hyperglycemic carbs in her diet.  She had gone to the [facility]  and has been following a modified Holstein  resulting  in  approximately 7-1/2 pound weight loss.   She is having some confusion and dizziness, typically approximately 4  o'clock in the afternoon.  She describes it as hitting a wall.  These  symptoms have been present for at least a week.  This is associated with  confusion and some tachycardia.   Her prior sleep dysfunction has improved off the sugars.   She has no non-healing skin lesions.  She has only positional  paresthesias.   A grandfather had diabetes.   She may have some polyuria but denies polyphagia or polydipsia.   Additional issue includes pain along the left foot from the ankle to the  dorsolateral aspect which she has had for several months, almost daily.  It seems to be worse at night.  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 Eden Lathe of  [facility].   She is presently on:  1. Paxil 20 mg daily.  2. Zegerid 40 mg each morning for acid reflux.  3. Crestor 10 mg daily.  4. Actonel 35 mg weekly.   She is due for repeat bone density study as she did have osteopenia.  She is on calcium, but not vitamin D.   Her  weight is 160.8, pulse 64, respiratory rate 16, blood pressure  114/80.  She has no carotid bruits; no murmurs or gallops are noted.  Pulses are intact and there is no edema.  She has no lymphadenopathy or organomegaly.  The thyroid is slightly asymmetric due to prior resection for  malignancy.No nodules were palpated.CHEST:  Clear.  SKIN:  Normal with no lesions.  She has pain with compression of the left foot.  There are no ischemic  changes in the foot.  She also has pain with internal rotation of the  ankle.  Deep tendon reflexes are normal.   Her symptoms are suggestive of hypoglycemia, approximately 4 p.m.  It  will be recommended that she obtain a protein supplement to use at  lunch.  This can be a protein bar which does not contain high fructose  corn syrup, or it can be increasing lean protein at the lunch meal.  She  can also use soy  protein supplement  as Nutlettes or soy powder.  An A1c  will be collected.   To evaluate her osteopenia vitamin D level will be drawn.   A uric acid will be collected to evaluate the foot pain and film  made  at [facility] as an outpatient.   ADDENDUM   She had a L thyroid lobectomy ; the right  is not pathologically  enlarged, had no nodules.   Additional recommendations are pending return of these results.  Orthopedic consultation may be necessary to evaluate the ankle pain.     Darrick Penna. Linna Darner, MD,FACP,FCCP  Electronically Signed    WFH/MedQ  DD: 09/05/2006  DT: 09/05/2006  Job #: [id number]

## 2010-09-24 NOTE — Op Note (Signed)
[name], [name]            ACCOUNT NO.:  [id number]   MEDICAL RECORD NO.:  [id number]          PATIENT TYPE:  AMB   LOCATION:  ENDO                         FACILITY:  [facility]   PHYSICIAN:  Nelwyn Salisbury, M.D.  DATE OF BIRTH:  [birth year]   DATE OF PROCEDURE:  11/03/2004  DATE OF DISCHARGE:                                 OPERATIVE REPORT   PROCEDURE PERFORMED:  Esophagogastroduodenoscopy.   ENDOSCOPIST:  Nelwyn Salisbury, M.D.   INSTRUMENT USED:  Olympus video panendoscope.   INDICATIONS FOR PROCEDURE:  [name] with a history of  reflux, epigastric pain, retrosternal burning and hoarseness, undergoing an  EGD rule out esophagitis, peptic ulcer disease, etc.  Patient is not  responding to double dose PPIs and Carafate.  She was taken off the Actonel  last week but this has not seemed to have helped her symptoms.   PREPROCEDURE PREPARATION:  Informed consent was procured from the patient.  The patient fasted for eight hours prior to the procedure.   PREPROCEDURE PHYSICAL EXAMINATION:  VITAL SIGNS:  Stable.  NECK:  Supple.  CHEST:  Clear to auscultation.  CARDIOVASCULAR:  S1 and S2 regular.  ABDOMEN:  Soft with normal bowel sounds.  Epigastric tenderness on  palpation.   DESCRIPTION OF PROCEDURE:  The patient was placed in left lateral decubitus  position, sedated with 50 mcg of Fentanyl and 2.5 mg of Versed given in slow  incremental doses intravenously.  Once the patient was adequately sedated  and maintained on low flow oxygen and continuous cardiac monitoring, the  Olympus video panendoscope was advanced through the mouthpiece over the  tongue and into the esophagus under direct vision.  The entire esophagus  appeared normal with no evidence of ring, stricture, masses, esophagitis or  Barrett's mucosa.  The scope was then advanced into the stomach.  The entire  gastric mucosa appeared normal.  The retroflexed view revealed no  abnormalities.  There  was mild duodenitis noted in the duodenal bulb.  Small-  bowel distal to the bulb appeared normal.  There was no outlet obstruction,  no signs of erosions, ulcerations, masses or polyps.  The patient tolerated  the procedure well without immediate complications.   IMPRESSION:  Normal esophagogastroduodenoscopy except for very mild  duodenitis.  No ulcerations, erosions or esophagitis noted.   RECOMMENDATIONS:  1.  Change Nexium to Zegerid 40 mg b.i.d.  2.  Check gallbladder ultrasound and HIDA scan.  3.  CT scan of the abdomen to further evaluate her abdominal pain.  4.  A 24-hour pH will be planned if symptoms persist in spite of the change      of PPI.       JNM/MEDQ  D:  11/03/2004  T:  11/03/2004  Job:  [id number]   cc:   Darrick Penna. Linna Darner, M.D. [facility]   Daniel L. Cherylann Banas, M.D.  [address] [place]  [place]  [place] [address]  Fax: [place]. Constance Holster, [place]  [place] [address]  Fax: [phone]

## 2010-09-24 NOTE — Assessment & Plan Note (Signed)
[facility] OFFICE NOTE   NAME:Leiber, ISADORE BOKHARI                   MRN:          [id number]  DATE:11/16/2005                            DOB:          [birth year]    Mrs. Massimo was seen on November 16, 2005 at age 60 with multiple concerns.  Her chief concern was a headache with associated neck pain.  Simply brushing  her hair caused a vice-like discomfort over the right side of her scalp to  the base of her scalp and then to the right posterolateral neck.  She had  been near a friend who had viral meningitis and was obviously concerned  about this.   Additionally, she described excessive fatigue.   Also, she had heaviness over the upper chest for approximately one week,  worse with breathing.   She denied any diarrhea or flushing with the headache.   She does have a past history of thyroid cancer which was treated by Dr.  Constance Holster, otolaryngologist.  She does not have ongoing monitor for this.   There is no past medical history of migraine.   She has been exercising four to five times per week using the spin machine  bike or elliptical machine with no cardiopulmonary symptoms, exercising up  to an hour.   Weight was up four pounds to 167.  Pulse was 68 and regular.  Blood pressure  112/80.  Cranial nerve exam was normal.  She had full extraocular motion and  field of vision.  Otolaryngologic exam and oropharyngeal exam was  unremarkable.  Neck was supple.  The left thyroid lobe was surgically  absent.  Cardiopulmonary exam was unremarkable.  Kernig's and Brudzinski  signs were negative.  Romberg testing was negative.  Musculoskeletal exam  revealed some crepitus in the knees.  Deep tendon reflexes 1-1/2+.   EKG revealed sinus bradycardia.   Normal or negative studies included a CBC and differential, sed rate and CMP  with the exception of a glucose of 102.   Total cholesterol was 205 and LDL was  113.  These were dramatically improved  since April 2006 when the total cholesterol was 361 and the LDL was 266.   CPK and MB were negative.  TSH was normal at 1.09.  Pending is a thyroid  globulin level.  If this is abnormal, then ultrasound would be recommended.   Unclear why she is not on suppressive thyroid therapy.  She is maintained on  Actonel for osteopenia and this and calcium and vitamin D should be  continued.   She will be notified of the lab results and referral made to the  cardiologist of choice if the chest symptoms persist.                                   Darrick Penna. Linna Darner, MD, [facility]   WFH/MedQ  DD:  11/22/2005  DT:  11/22/2005  Job #:  [id number]

## 2010-09-24 NOTE — Op Note (Signed)
[place]. [facility]  Patient:    WINTA, BARCELO Visit Number: [id number] MRN: [id number]          Service Type: DSU Location: [id number] Attending Physician:  Beckie Salts Proc. Date: 05/04/01 Admit Date:  05/04/2001 Discharge Date: 05/05/2001   CC:         Sharene Butters, M.D.   Operative Report  PREOPERATIVE DIAGNOSIS:  Left thyroid nodule.  POSTOPERATIVE DIAGNOSIS:  Left thyroid nodule.  PROCEDURE PERFORMED:  Left hemithyroidectomy.  SURGEON:  Jefry H. Constance Holster, M.D.  ASSISTANT:  Windell Moment, M.D.  ANESTHESIA:  General endotracheal.  COMPLICATIONS:  None.  FINDINGS:  A 1.5 cm thyroid nodule left superior pole and a smaller nodule in the mid-inferior portion of the left lobe.  No abnormal findings on the right side.  The patient tolerated the procedure well, was awake, extubated, and transferred to recovery in stable condition.  INDICATION:  This is a 60 year old lady who had an incidental finding of a thyroid nodule on the left side.  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  Risks, benefits, alternatives, and complications of the procedure were explained to the patient and her husband who seemed to understand and agreed to surgery.  DESCRIPTION OF PROCEDURE:  The patient was taken to the operating room and placed on the operating room table in the supine position.  Following induction of general endotracheal anesthesia, the patient was positioned on the bed with a shoulder roll in place and the skin was prepped and draped in standard fashion.  A low collar transverse incision was outlined with a marking pen and electrocautery was used to incise the skin and subcutaneous tissue through the platysma layer.  Subplatysmal flap was developed superiorly up to the thyroid notch.  A thyroid retractor was put into position.  The midline fascia was divided using electrocautery dissection.  The diastasis of the  strap muscle was divided and the straps were reflected laterally off the left side.  The left thyroid lobe was retracted medially using Babcock forceps and careful dissection around the capsule and the gland was then accomplished. The superior pole was brought down carefully ligating all vascular attachments.  The middle thyroid vein was ligated between clamps and divided. A superior and inferior parathyroid gland were identified and preserved with their blood supply.  The recurrent laryngeal nerve was identified as well as preserved.  The remaining vascular attachments of the inferior pole were divided and ligated.  The isthmus was divided using electrocautery and the specimen delivered and sent for pathologic evaluation.  Frozen section revealed follicular lesions without any obvious carcinoma.  The right side was palpated and there were no palpable nodules.  Hemostasis was completed in the surgical bed.  The wound was closed in layers using 3-0 and 4-0 chromic on the midline fascia and the strap layer and running 5-0 nylon on the skin.  A 7 [place] round JP drain was left in the wound and exited through the right side of the incision.  This was secured in place with a nylon suture.  The patient was then awakened from anesthesia and transferred to recover in stable condition.   ESTIMATED BLOOD LOSS:  30 cc. Attending Physician:  Beckie Salts DD:  05/04/01 TD:  05/05/01 Job: [id number]

## 2010-10-05 ENCOUNTER — Other Ambulatory Visit: Payer: Self-pay | Admitting: Obstetrics and Gynecology

## 2010-10-05 ENCOUNTER — Ambulatory Visit (INDEPENDENT_AMBULATORY_CARE_PROVIDER_SITE_OTHER): Payer: BC Managed Care – PPO | Admitting: Obstetrics and Gynecology

## 2010-10-05 DIAGNOSIS — N644 Mastodynia: Secondary | ICD-10-CM

## 2010-10-05 DIAGNOSIS — N6019 Diffuse cystic mastopathy of unspecified breast: Secondary | ICD-10-CM

## 2010-10-05 DIAGNOSIS — R52 Pain, unspecified: Secondary | ICD-10-CM

## 2010-10-06 ENCOUNTER — Ambulatory Visit
Admission: RE | Admit: 2010-10-06 | Discharge: 2010-10-06 | Disposition: A | Discharge status: Home / Self Care | Payer: BC Managed Care – PPO | Source: Ambulatory Visit | Attending: Obstetrics and Gynecology | Admitting: Obstetrics and Gynecology

## 2010-10-06 ENCOUNTER — Other Ambulatory Visit: Payer: Self-pay | Admitting: Obstetrics and Gynecology

## 2010-10-06 DIAGNOSIS — R52 Pain, unspecified: Secondary | ICD-10-CM

## 2010-10-20 ENCOUNTER — Ambulatory Visit
Admission: RE | Admit: 2010-10-20 | Discharge: 2010-10-20 | Disposition: A | Discharge status: Home / Self Care | Payer: BC Managed Care – PPO | Source: Ambulatory Visit | Attending: Obstetrics and Gynecology | Admitting: Obstetrics and Gynecology

## 2010-10-20 DIAGNOSIS — R52 Pain, unspecified: Secondary | ICD-10-CM

## 2010-10-25 ENCOUNTER — Ambulatory Visit (INDEPENDENT_AMBULATORY_CARE_PROVIDER_SITE_OTHER): Payer: BC Managed Care – PPO | Admitting: Obstetrics and Gynecology

## 2010-10-25 DIAGNOSIS — N644 Mastodynia: Secondary | ICD-10-CM

## 2010-10-25 DIAGNOSIS — N61 Mastitis without abscess: Secondary | ICD-10-CM

## 2010-11-08 ENCOUNTER — Encounter: Payer: Self-pay | Admitting: Gastroenterology

## 2010-11-13 ENCOUNTER — Other Ambulatory Visit: Payer: Self-pay | Admitting: Internal Medicine

## 2010-11-15 NOTE — Telephone Encounter (Signed)
Patient needs to schedule a CPX

## 2010-11-24 ENCOUNTER — Ambulatory Visit: Payer: BC Managed Care – PPO | Admitting: Obstetrics and Gynecology

## 2010-11-25 ENCOUNTER — Ambulatory Visit (INDEPENDENT_AMBULATORY_CARE_PROVIDER_SITE_OTHER): Payer: BC Managed Care – PPO | Admitting: Obstetrics and Gynecology

## 2010-11-25 DIAGNOSIS — N6019 Diffuse cystic mastopathy of unspecified breast: Secondary | ICD-10-CM

## 2010-11-25 DIAGNOSIS — N644 Mastodynia: Secondary | ICD-10-CM

## 2011-02-10 ENCOUNTER — Encounter: Payer: Self-pay | Admitting: Obstetrics and Gynecology

## 2011-02-10 ENCOUNTER — Other Ambulatory Visit (HOSPITAL_COMMUNITY)
Admission: RE | Admit: 2011-02-10 | Discharge: 2011-02-10 | Disposition: A | Discharge status: Home / Self Care | Payer: BC Managed Care – PPO | Source: Ambulatory Visit | Attending: Obstetrics and Gynecology | Admitting: Obstetrics and Gynecology

## 2011-02-10 ENCOUNTER — Ambulatory Visit (INDEPENDENT_AMBULATORY_CARE_PROVIDER_SITE_OTHER): Payer: BC Managed Care – PPO | Admitting: Obstetrics and Gynecology

## 2011-02-10 VITALS — BP 124/74 | Ht 65.5 in | Wt 157.0 lb

## 2011-02-10 DIAGNOSIS — M8580 Other specified disorders of bone density and structure, unspecified site: Secondary | ICD-10-CM

## 2011-02-10 DIAGNOSIS — Z01419 Encounter for gynecological examination (general) (routine) without abnormal findings: Secondary | ICD-10-CM | POA: Insufficient documentation

## 2011-02-10 DIAGNOSIS — M899 Disorder of bone, unspecified: Secondary | ICD-10-CM

## 2011-02-10 DIAGNOSIS — M79609 Pain in unspecified limb: Secondary | ICD-10-CM

## 2011-02-10 DIAGNOSIS — M79629 Pain in unspecified upper arm: Secondary | ICD-10-CM

## 2011-02-10 DIAGNOSIS — M949 Disorder of cartilage, unspecified: Secondary | ICD-10-CM

## 2011-02-10 NOTE — Progress Notes (Signed)
Patient came back to see me today for an annual GYN exam. Previously this year she was having right mastodynia. On mammogram and ultrasound they thought she had mastitis. It was not clinically present. The radiologist treat her with antibiotics twice without success. There were no suspicious lesions. I treated her with danazol with complete resolution of her symptoms. She now is noticing some discomfort in her right axilla. It is different from the above. She feels that more when she extends her arm. She is up-to-date on mammograms and bone densities. She has osteopenia without an elevated FRAX risk.  HEENT: Within normal limits. Neck: No masses. Supraclavicular lymph nodes: Not enlarged. Breasts: Examined in both sitting and lying position. Symmetrical without skin changes or masses. Abdomen: Soft no masses guarding or rebound. No hernias. Pelvic: External within normal limits. BUS within normal limits. Vaginal examination shows good estrogen effect, no cystocele enterocele or rectocele. Cervix and uterus absent. Adnexa within normal limits. Rectovaginal confirmatory. Extremities within normal limits.   Assessment: Right axilla pain. Osteopenia.  Plan: Orthopedic consult, continue yearly mammograms, bone density here in 2 years.

## 2011-02-15 ENCOUNTER — Other Ambulatory Visit: Payer: Self-pay | Admitting: Obstetrics and Gynecology

## 2011-02-15 DIAGNOSIS — Z1231 Encounter for screening mammogram for malignant neoplasm of breast: Secondary | ICD-10-CM

## 2011-02-28 ENCOUNTER — Ambulatory Visit
Admission: RE | Admit: 2011-02-28 | Discharge: 2011-02-28 | Disposition: A | Discharge status: Home / Self Care | Payer: BC Managed Care – PPO | Source: Ambulatory Visit | Attending: Obstetrics and Gynecology | Admitting: Obstetrics and Gynecology

## 2011-02-28 DIAGNOSIS — Z1231 Encounter for screening mammogram for malignant neoplasm of breast: Secondary | ICD-10-CM

## 2011-03-14 ENCOUNTER — Telehealth: Payer: Self-pay | Admitting: Gastroenterology

## 2011-03-14 NOTE — Telephone Encounter (Signed)
Pt is going to see a specialist at [facility] and needs to know how to complete the RELEASE OF INFORMATION form; instructed pt to put dates from 08/02/07 to present. Pt stated understanding.

## 2011-04-03 ENCOUNTER — Other Ambulatory Visit: Payer: Self-pay | Admitting: Gastroenterology

## 2011-04-28 ENCOUNTER — Emergency Department (INDEPENDENT_AMBULATORY_CARE_PROVIDER_SITE_OTHER): Payer: BC Managed Care – PPO

## 2011-04-28 ENCOUNTER — Emergency Department (HOSPITAL_BASED_OUTPATIENT_CLINIC_OR_DEPARTMENT_OTHER)
Admission: EM | Admit: 2011-04-28 | Discharge: 2011-04-28 | Disposition: A | Discharge status: Home / Self Care | Payer: BC Managed Care – PPO | Attending: Emergency Medicine | Admitting: Emergency Medicine

## 2011-04-28 ENCOUNTER — Encounter (HOSPITAL_BASED_OUTPATIENT_CLINIC_OR_DEPARTMENT_OTHER): Payer: Self-pay | Admitting: *Deleted

## 2011-04-28 DIAGNOSIS — R42 Dizziness and giddiness: Secondary | ICD-10-CM | POA: Insufficient documentation

## 2011-04-28 DIAGNOSIS — W1809XA Striking against other object with subsequent fall, initial encounter: Secondary | ICD-10-CM

## 2011-04-28 DIAGNOSIS — M545 Low back pain, unspecified: Secondary | ICD-10-CM

## 2011-04-28 DIAGNOSIS — W010XXA Fall on same level from slipping, tripping and stumbling without subsequent striking against object, initial encounter: Secondary | ICD-10-CM | POA: Insufficient documentation

## 2011-04-28 DIAGNOSIS — M549 Dorsalgia, unspecified: Secondary | ICD-10-CM

## 2011-04-28 DIAGNOSIS — Y92009 Unspecified place in unspecified non-institutional (private) residence as the place of occurrence of the external cause: Secondary | ICD-10-CM | POA: Insufficient documentation

## 2011-04-28 DIAGNOSIS — R5383 Other fatigue: Secondary | ICD-10-CM

## 2011-04-28 DIAGNOSIS — R51 Headache: Secondary | ICD-10-CM

## 2011-04-28 DIAGNOSIS — W19XXXA Unspecified fall, initial encounter: Secondary | ICD-10-CM

## 2011-04-28 DIAGNOSIS — M546 Pain in thoracic spine: Secondary | ICD-10-CM

## 2011-04-28 DIAGNOSIS — Z79899 Other long term (current) drug therapy: Secondary | ICD-10-CM | POA: Insufficient documentation

## 2011-04-28 LAB — BASIC METABOLIC PANEL
CO2: 31 mEq/L (ref 19–32)
Calcium: 10 mg/dL (ref 8.4–10.5)
GFR calc Af Amer: >90 mL/min (ref >90)
GFR calc non Af Amer: >90 mL/min (ref >90)
Sodium: 142 mEq/L (ref 135–145)

## 2011-04-28 LAB — CBC
MCV: 90.7 fL (ref 78.0–100.0)
Platelets: 230 10*3/uL (ref 150–400)
RBC: 4.39 MIL/uL (ref 3.87–5.11)
WBC: 6 10*3/uL (ref 4.0–10.5)

## 2011-04-28 LAB — URINALYSIS, ROUTINE W REFLEX MICROSCOPIC
Protein, ur: NEGATIVE mg/dL
Specific Gravity, Urine: 1.007 (ref 1.005–1.030)
Urobilinogen, UA: 0.2 mg/dL (ref 0.0–1.0)
pH: 8 (ref 5.0–8.0)

## 2011-04-28 NOTE — ED Provider Notes (Signed)
History     CSN: [id number]  Arrival date & time 04/28/11  1117   First MD Initiated Contact with Patient 04/28/11 1220      Chief Complaint  Patient presents with  . Back Pain  . Dizziness    (Consider location/radiation/quality/duration/timing/severity/associated sxs/prior treatment) HPI Comments: Pt states that since the incident she has felt dizzy and very tired and has had generalized spine pain which she has been seeing her chiropractor for  Patient is a 60 y.o. female presenting with fall. The history is provided by the patient. No language interpreter was used.  Fall Incident onset: 1 month ago. Incident: walking in [employer]. She landed on a hard floor. The pain is moderate. She was ambulatory at the scene. There was no entrapment after the fall. There was no drug use involved in the accident. There was no alcohol use involved in the accident. Pertinent negatives include no visual change, no numbness, no vomiting and no loss of consciousness.    Past Medical History  Diagnosis Date  . Celiac disease     Gluten free diet  . Thyroid disease     Thyroid cancer  . Fibroid   . Elevated cholesterol     Past Surgical History  Procedure Date  . Appendectomy   . Breast surgery 1999    Reduction  . Knee surgery   . Thyroid surgery     Partial thyroidectomy  . Brain surgery 2010    hemorrage  . Abdominal hysterectomy 1986    TAH    Family History  Problem Relation Age of Onset  . Diabetes Maternal Aunt   . Breast cancer Maternal Aunt     Maternal Great aunt  . Heart disease Maternal Grandfather   . Hypertension Paternal Grandfather   . Cancer Father     Liver cancer    History  Substance Use Topics  . Smoking status: Former Research scientist (life sciences)  . Smokeless tobacco: Not on file  . Alcohol Use: 0.5 oz/week    1 drink(s) per week    OB History    Grav Para Term Preterm Abortions TAB SAB Ect Mult Living   2 2 2       2       Review of Systems  Gastrointestinal:  Negative for vomiting.  Neurological: Negative for loss of consciousness and numbness.  All other systems reviewed and are negative.    Allergies  Ambien; Cortisone; Dicloxacillin; Dilantin; Erythromycin; Levofloxacin; and Zolpidem tartrate  Home Medications   Current Outpatient Rx  Name Route Sig Dispense Refill  . CLONAZEPAM 0.5 MG PO TABS Oral Take 0.5 mg by mouth at bedtime as needed.      [name] CALCIUM + D PO Oral Take by mouth 2 (two) times daily.      [name] VITAMIN D PO Oral Take 1,000 Units by mouth.      . ONE-DAILY MULTI VITAMINS PO TABS Oral Take 1 tablet by mouth daily.      [name] PAROXETINE HCL 10 MG PO TABS  TAKE 1 TABLET EVERY DAY 90 tablet 0    **APPOINTMENT OVER-DUE**  . RABEPRAZOLE SODIUM 20 MG PO TBEC Oral Take 1 tablet (20 mg total) by mouth 2 (two) times daily. 60 tablet 6  . ROPINIROLE HCL PO Oral Take by mouth.      . ROSUVASTATIN CALCIUM 10 MG PO TABS Oral Take 10 mg by mouth daily.        BP 145/71  Pulse 74  Temp 97.9  F (36.6 C)  Resp 16  SpO2 100%  Physical Exam  Nursing note and vitals reviewed. Constitutional: She is oriented to person, place, and time. She appears well-developed and well-nourished.  HENT:  Head: Normocephalic and atraumatic.  Eyes: Conjunctivae and EOM are normal. Pupils are equal, round, and reactive to light.  Neck: Normal range of motion. Neck supple.  Cardiovascular: Normal rate and regular rhythm.   Pulmonary/Chest: Effort normal and breath sounds normal.  Abdominal: Soft. Bowel sounds are normal.  Musculoskeletal: Normal range of motion.       Cervical back: Normal.       Thoracic back: She exhibits bony tenderness.       Lumbar back: She exhibits bony tenderness.  Neurological: She is alert and oriented to person, place, and time. Coordination normal.  Skin: Skin is warm and dry.  Psychiatric: She has a normal mood and affect.    ED Course  Procedures (including critical care time)   Labs Reviewed  BASIC METABOLIC PANEL   CBC  URINALYSIS, ROUTINE W REFLEX MICROSCOPIC   Dg Thoracic Spine 4v  04/28/2011  *RADIOLOGY REPORT*  Clinical Data: 60 year old female status post fall.  Pain and dizziness.  THORACIC SPINE - 4+ VIEW  Comparison: Chest radiographs 06/04/2010 and earlier.  Chest CT 02/10/2010.  Findings: 12 full size ribs.  Normal thoracic segmentation. Dextroconvex lower thoracic scoliosis, otherwise normal thoracic vertebral height and alignment.  Relatively preserved disc spaces. Cervicothoracic junction alignment is within normal limits.  C7-T1 disc degeneration suspected.  Grossly intact visualized upper lumbar spine.  IMPRESSION: No acute fracture or listhesis identified in the thoracic spine.  Original Report Authenticated By: Randall An, M.D.   Dg Lumbar Spine Complete  04/28/2011  *RADIOLOGY REPORT*  Clinical Data: Fall striking back of head, low back pain  LUMBAR SPINE - COMPLETE 4+ VIEW  Comparison: 06/04/2010  Findings: Osseous demineralization. Hypoplastic last rib pair. Five non-rib bearing lumbar type vertebrae. Partial sacralization of left transverse process of L5. Facet degenerative changes lower lumbar spine. Minimal disc space narrowing L4-L5 and L5-S1. Vertebral body heights maintained without fracture or subluxation. No spondylolysis or bone destruction. SI joints symmetric.  IMPRESSION: Minimal degenerative disc and facet disease changes of the lower lumbar spine as above. Osseous demineralization. No acute abnormalities.  Original Report Authenticated By: Burnetta Sabin, M.D.   Ct Head Wo Contrast  04/28/2011  *RADIOLOGY REPORT*  Clinical Data: Dizziness.  Headache.  CT HEAD WITHOUT CONTRAST  Technique:  Contiguous axial images were obtained from the base of the skull through the vertex without contrast.  Comparison: 06/04/2010  Findings: There is no evidence for acute hemorrhage, hydrocephalus, mass lesion, or abnormal extra-axial fluid collection.  No definite CT evidence for acute  infarction.  The visualized paranasal sinuses and mastoid air cells are clear.  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  IMPRESSION: Stable.  No acute intracranial findings.  Original Report Authenticated By: ERIC A. MANSELL, M.D.     1. Fall   2. Fatigue   3. Back pain       MDM  No findings to suggest reason for fatigue        Glendell Docker, NP 04/28/11 1433

## 2011-04-28 NOTE — ED Notes (Signed)
Pt states she had brain surgery 2 yrs ago a month ago she fell in [employer] and landed on concrete floor striking back of her head since then she feels like she has been having more sleepiness dizziness and inability to do normal activities also having mid and low back pain since the fall.

## 2011-04-28 NOTE — ED Provider Notes (Signed)
History/physical exam/procedure(s) were performed by non-physician practitioner and as supervising physician I was immediately available for consultation/collaboration. I have reviewed all notes and am in agreement with care and plan.   Shaune Pollack, MD 04/28/11 [phone]

## 2011-05-19 ENCOUNTER — Telehealth: Payer: Self-pay | Admitting: Gastroenterology

## 2011-05-20 NOTE — Telephone Encounter (Signed)
Pt states she's having trouble with her HH and wants to know who we referred her to last year. Per Dr Buel Ream documentation note from 08/17/10, he wanted her to see Dr Kaylyn Lim at [facility]; pt stated understanding.

## 2011-05-20 NOTE — Telephone Encounter (Signed)
lmom for pt to call back

## 2011-05-25 ENCOUNTER — Telehealth: Payer: Self-pay | Admitting: Gastroenterology

## 2011-05-25 NOTE — Telephone Encounter (Signed)
lmom for pt to call back

## 2011-05-26 NOTE — Telephone Encounter (Signed)
Pt called to report worsening of her Hiatal Hernia. She has an appt with a Psychologist, sport and exercise but that's not until mid February. I offered her an appt with a mid level tomorrow, but she would rather wait for Dr Sharlett Iles to return; scheduled for 06/15/11 at 2pm.

## 2011-06-10 ENCOUNTER — Encounter: Payer: Self-pay | Admitting: *Deleted

## 2011-06-15 ENCOUNTER — Ambulatory Visit: Payer: BC Managed Care – PPO | Admitting: Gastroenterology

## 2011-06-23 ENCOUNTER — Ambulatory Visit (INDEPENDENT_AMBULATORY_CARE_PROVIDER_SITE_OTHER): Payer: Self-pay | Admitting: Surgery

## 2011-07-18 ENCOUNTER — Encounter: Payer: Self-pay | Admitting: *Deleted

## 2011-07-19 ENCOUNTER — Ambulatory Visit (INDEPENDENT_AMBULATORY_CARE_PROVIDER_SITE_OTHER): Payer: BC Managed Care – PPO | Admitting: Gastroenterology

## 2011-07-19 ENCOUNTER — Encounter: Payer: Self-pay | Admitting: Gastroenterology

## 2011-07-19 VITALS — BP 118/64 | HR 80 | Ht 65.0 in | Wt 152.0 lb

## 2011-07-19 DIAGNOSIS — K449 Diaphragmatic hernia without obstruction or gangrene: Secondary | ICD-10-CM

## 2011-07-19 DIAGNOSIS — R0789 Other chest pain: Secondary | ICD-10-CM | POA: Insufficient documentation

## 2011-07-19 MED ORDER — SUCRALFATE 1 GM/10ML PO SUSP: ORAL | Status: DC

## 2011-07-19 NOTE — Progress Notes (Addendum)
This is a 61 year old Caucasian female with suspected chronic GERD although previous workups have been negative including endoscopy, esophageal biopsies, and esophageal manometry. She apparently had previous 24-hour pH probe testing in [facility]. She has been on chronic AcipHex 20 mg twice a day and had been doing well until she allegedly recently slipped on a grape at a local grocery store, and apparently relates worsening of her" hernia". She is convinced that she has a large hiatal hernia; this has not been proven. She goes to a homeopathic RN who "massages" her hiatal hernia and allegedly allows her great relief. Obviously this is a very unusual and atypical situation. She denies dysphagia or any specific hepatobiliary complaints. The patient continued to complain of burning substernal and diffuse chest pain without real precipitating or alleviating elements. She denies systemic complaints or any hepatobiliary or lower gastrointestinal problems. She is also convinced that she has celiac disease, and follows a gluten-free diet.  Current Medications, Allergies, Past Medical History, Past Surgical History, Family History and Social History were reviewed in [employer] record.  Pertinent Review of Systems Negative   Physical Exam: Blood pressure 118/64 pulse 80 and regular. BMI 25.29. Cannot appreciate stigmata of chronic liver disease, thyromegaly or lymphadenopathy. Chest is clear cardiac exam is unremarkable. I cannot appreciate hepatosplenomegaly, abdominal masses or tenderness. Bowel sounds are normal. Peripheral extremities unremarkable. Mental status appears clear.    Assessment and Plan: Atypical chest pain etiology unclear. I've suggested to her that she have BRAVO 48 hour pH probe testing done, but she refuses because she cannot do very long without PPI therapy. I have scheduled her for followup endoscopy, also have added Carafate suspension PC and each  bedtime. She may need referral to a tertiary [facility]. I Do Not think that she is a good candidate for fundoplication surgery. Please copy this note to Dr. Johnathan Hausen Encounter Diagnosis  Name Primary?  . Hiatal hernia Yes

## 2011-07-19 NOTE — Patient Instructions (Signed)
Your procedure has been scheduled for 07/25/2011, please follow the seperate instructions.  Your prescription(s) have been sent to you pharmacy.

## 2011-07-25 ENCOUNTER — Telehealth: Payer: Self-pay | Admitting: *Deleted

## 2011-07-25 ENCOUNTER — Ambulatory Visit (AMBULATORY_SURGERY_CENTER): Payer: BC Managed Care – PPO | Admitting: Gastroenterology

## 2011-07-25 ENCOUNTER — Other Ambulatory Visit: Payer: Self-pay | Admitting: *Deleted

## 2011-07-25 ENCOUNTER — Encounter: Payer: Self-pay | Admitting: Gastroenterology

## 2011-07-25 VITALS — BP 139/93 | HR 73 | Temp 98.7°F | Resp 41 | Ht 65.0 in | Wt 152.0 lb

## 2011-07-25 DIAGNOSIS — D130 Benign neoplasm of esophagus: Secondary | ICD-10-CM

## 2011-07-25 DIAGNOSIS — R0789 Other chest pain: Secondary | ICD-10-CM

## 2011-07-25 DIAGNOSIS — K449 Diaphragmatic hernia without obstruction or gangrene: Secondary | ICD-10-CM

## 2011-07-25 DIAGNOSIS — R1013 Epigastric pain: Secondary | ICD-10-CM

## 2011-07-25 DIAGNOSIS — K219 Gastro-esophageal reflux disease without esophagitis: Secondary | ICD-10-CM

## 2011-07-25 MED ORDER — SODIUM CHLORIDE 0.9 % IV SOLN: 500.0000 mL | INTRAVENOUS | Status: DC

## 2011-07-25 MED ORDER — SUCRALFATE 1 G PO TABS: 1.0000 g | ORAL_TABLET | Freq: Four times a day (QID) | ORAL | Status: DC

## 2011-07-25 NOTE — Op Note (Signed)
[facility] [address] [facility] [address]. [place], [place]  [address]  ENDOSCOPY PROCEDURE REPORT  PATIENT:  Amanda, [name]  MR#:  [id number] BIRTHDATE:  1951-01-08, 60 yrs. old  GENDER:  female  ENDOSCOPIST:  Loralee Pacas. Sharlett Iles, MD, [facility] Referred by:  PROCEDURE DATE:  07/25/2011 PROCEDURE:  EGD with biopsy, [id number] ASA CLASS:  Class II INDICATIONS:  globus, GERD VERY ATYPICAL CHEST PAIN.SEE CLINIC NOTE !!!  MEDICATIONS:   propofol (Diprivan) 200 mg IV TOPICAL ANESTHETIC:  DESCRIPTION OF PROCEDURE:   After the risks and benefits of the procedure were explained, informed consent was obtained.  The [facility] [id number]-H180 [id number] endoscope was introduced through the mouth and advanced to the second portion of the duodenum.  The instrument was slowly withdrawn as the mucosa was fully examined. <<PROCEDUREIMAGES>>  Otherwise the examination was normal. NO HH NOTED AND NO ESOPHAGITIS.RANDOM ESOPHAGEAL BIOPSIES DONE.    SCATTERED HYPERPLASTIC POLYP [place].OTHERWISE NORMAL.  The scope was then withdrawn from the patient and the procedure completed.  COMPLICATIONS:  None  ENDOSCOPIC IMPRESSION: 1) Otherwise normal examination ATYPICAL CHEST PAIN.MAXIMUM REFLUX RX. OF LITTLE BENEFIT.IF BIOPSIES ARE NORMAL,RECOMMEND PAIN CLINIC EVALUATION. RECOMMENDATIONS: 1) Await biopsy results 2) continue current medications  ______________________________ Loralee Pacas. Sharlett Iles, MD, [facility]  CC:  [name], MD  n. Lorrin Mais:   Loralee Pacas. Saher Davee at 07/25/2011 04:04 PM  Ron Agee, [phone]

## 2011-07-25 NOTE — Telephone Encounter (Signed)
LM for patient I will call in the AM.  Dr Sharlett Iles ordered ABD [place] and I wanted to tell patient date and time.

## 2011-07-25 NOTE — Progress Notes (Signed)
Patient did not experience any of the following events: a burn prior to discharge; a fall within the facility; wrong site/side/patient/procedure/implant event; or a hospital transfer or hospital admission upon discharge from the facility. (G8907) Patient did not have preoperative order for IV antibiotic SSI prophylaxis. (G8918)  

## 2011-07-25 NOTE — Patient Instructions (Signed)
YOU HAD AN ENDOSCOPIC PROCEDURE TODAY AT THE [facility]: Refer to the procedure report that was given to you for any specific questions about what was found during the examination.  If the procedure report does not answer your questions, please call your gastroenterologist to clarify.  If you requested that your care partner not be given the details of your procedure findings, then the procedure report has been included in a sealed envelope for you to review at your convenience later.  YOU SHOULD EXPECT: Some feelings of bloating in the abdomen. Passage of more gas than usual.  Walking can help get rid of the air that was put into your GI tract during the procedure and reduce the bloating. If you had a lower endoscopy (such as a colonoscopy or flexible sigmoidoscopy) you may notice spotting of blood in your stool or on the toilet paper. If you underwent a bowel prep for your procedure, then you may not have a normal bowel movement for a few days.  DIET: Your first meal following the procedure should be a light meal and then it is ok to progress to your normal diet.  A half-sandwich or bowl of soup is an example of a good first meal.  Heavy or fried foods are harder to digest and may make you feel nauseous or bloated.  Likewise meals heavy in dairy and vegetables can cause extra gas to form and this can also increase the bloating.  Drink plenty of fluids but you should avoid alcoholic beverages for 24 hours.  ACTIVITY: Your care partner should take you home directly after the procedure.  You should plan to take it easy, moving slowly for the rest of the day.  You can resume normal activity the day after the procedure however you should NOT DRIVE or use heavy machinery for 24 hours (because of the sedation medicines used during the test).    SYMPTOMS TO REPORT IMMEDIATELY: A gastroenterologist can be reached at any hour.  During normal business hours, 8:30 AM to 5:00 PM Monday through Friday,  call [phone].  After hours and on weekends, please call the GI answering service at [phone] who will take a message and have the physician on call contact you.   Following lower endoscopy (colonoscopy or flexible sigmoidoscopy):  Excessive amounts of blood in the stool  Significant tenderness or worsening of abdominal pains  Swelling of the abdomen that is new, acute  Fever of 100F or higher  Following upper endoscopy (EGD)  Vomiting of blood or coffee ground material  New chest pain or pain under the shoulder blades  Painful or persistently difficult swallowing  New shortness of breath  Fever of 100F or higher  Black, tarry-looking stools  FOLLOW UP: If any biopsies were taken you will be contacted by phone or by letter within the next 1-3 weeks.  Call your gastroenterologist if you have not heard about the biopsies in 3 weeks.  Our staff will call the home number listed on your records the next business day following your procedure to check on you and address any questions or concerns that you may have at that time regarding the information given to you following your procedure. This is a courtesy call and so if there is no answer at the home number and we have not heard from you through the emergency physician on call, we will assume that you have returned to your regular daily activities without incident.  SIGNATURES/CONFIDENTIALITY: You and/or your care   partner have signed paperwork which will be entered into your electronic medical record.  These signatures attest to the fact that that the information above on your After Visit Summary has been reviewed and is understood.  Full responsibility of the confidentiality of this discharge information lies with you and/or your care-partner.  

## 2011-07-26 ENCOUNTER — Telehealth: Payer: Self-pay | Admitting: *Deleted

## 2011-07-26 NOTE — Telephone Encounter (Signed)
Left message to call us back if having problems or has any questions

## 2011-07-26 NOTE — Telephone Encounter (Signed)
I called the patient on her home number and cell number and advised Dr. Sharlett Iles had me order her an Abdominal US.  I left the location, [facility] Radiology for Thurs 07-26-2011 at 9 AM. She is to arrive at 8:45 AM. I left the number for Radiology in case she needs to reschedule.

## 2011-07-26 NOTE — Telephone Encounter (Signed)
See previous message.  I had to leave a message on the home number and cell number.

## 2011-07-26 NOTE — Telephone Encounter (Signed)
Per EGD report from 07/25/11, called pt to ask about the Pain Clinic referral Dr Sharlett Iles recommended . Pt reports someone called her about an U/S and she didn't understand what they said. Looked in notes and Amanda [name] CMA had called the pt about an U/S. Went over the appt and prep with pt and informed her I will call back after results are received; pt stated understanding.

## 2011-07-28 ENCOUNTER — Ambulatory Visit (HOSPITAL_COMMUNITY)
Admission: RE | Admit: 2011-07-28 | Discharge: 2011-07-28 | Disposition: A | Discharge status: Home / Self Care | Payer: BC Managed Care – PPO | Source: Ambulatory Visit | Attending: Gastroenterology | Admitting: Gastroenterology

## 2011-07-28 DIAGNOSIS — R109 Unspecified abdominal pain: Secondary | ICD-10-CM | POA: Insufficient documentation

## 2011-07-29 ENCOUNTER — Encounter: Payer: Self-pay | Admitting: Gastroenterology

## 2011-07-29 ENCOUNTER — Telehealth: Payer: Self-pay | Admitting: *Deleted

## 2011-07-29 NOTE — Telephone Encounter (Signed)
Message copied by Lance Morin on Fri Jul 29, 2011  4:48 PM ------      Message from: Sharlett Iles, DAVID R      Created: Fri Jul 29, 2011 10:07 AM       Yes Yes Yes...asap      ----- Message -----         From: Olene Floss, RN         Sent: 07/29/2011   9:30 AM           To: Sable Feil, MD            On EGD report from 07/25/11, you mentioned a Pain Clinic Referral; upper abd. U/S was normal and you wrote continue reflux meds. Do you still want a pain clinic referral? Thanks.

## 2011-07-29 NOTE — Telephone Encounter (Signed)
Called pt to ask if she agreed with the pain clinic referral and she stated she hasn't had any pain since her procedure. She states she has felt good all week. She asked about her path and we reviewed the report of squamous tissue w/o dysplasia pr malignancy and she stated understanding. She agreed to call if pain began again and we can make the referral.

## 2011-07-29 NOTE — Telephone Encounter (Signed)
I did call the patient and advised her of the [place] at [facility] on 07-28-2011.  I checked the schedule and the patient did arrive for the [place].

## 2011-09-08 ENCOUNTER — Other Ambulatory Visit: Payer: Self-pay | Admitting: Otolaryngology

## 2011-09-08 DIAGNOSIS — E041 Nontoxic single thyroid nodule: Secondary | ICD-10-CM

## 2011-09-12 ENCOUNTER — Ambulatory Visit
Admission: RE | Admit: 2011-09-12 | Discharge: 2011-09-12 | Disposition: A | Discharge status: Home / Self Care | Payer: BC Managed Care – PPO | Source: Ambulatory Visit | Attending: Otolaryngology | Admitting: Otolaryngology

## 2011-09-12 DIAGNOSIS — E041 Nontoxic single thyroid nodule: Secondary | ICD-10-CM

## 2011-09-15 ENCOUNTER — Other Ambulatory Visit: Payer: Self-pay | Admitting: Dermatology

## 2011-11-22 ENCOUNTER — Other Ambulatory Visit: Payer: Self-pay | Admitting: Obstetrics and Gynecology

## 2012-01-24 ENCOUNTER — Other Ambulatory Visit: Payer: Self-pay | Admitting: Obstetrics and Gynecology

## 2012-01-24 DIAGNOSIS — Z1231 Encounter for screening mammogram for malignant neoplasm of breast: Secondary | ICD-10-CM

## 2012-02-16 ENCOUNTER — Ambulatory Visit (INDEPENDENT_AMBULATORY_CARE_PROVIDER_SITE_OTHER): Payer: BC Managed Care – PPO | Admitting: Obstetrics and Gynecology

## 2012-02-16 ENCOUNTER — Encounter: Payer: Self-pay | Admitting: Obstetrics and Gynecology

## 2012-02-16 VITALS — BP 124/74 | Ht 66.0 in | Wt 153.0 lb

## 2012-02-16 DIAGNOSIS — Z01419 Encounter for gynecological examination (general) (routine) without abnormal findings: Secondary | ICD-10-CM

## 2012-02-16 DIAGNOSIS — IMO0001 Reserved for inherently not codable concepts without codable children: Secondary | ICD-10-CM | POA: Insufficient documentation

## 2012-02-16 DIAGNOSIS — A64 Unspecified sexually transmitted disease: Secondary | ICD-10-CM | POA: Insufficient documentation

## 2012-02-16 NOTE — Progress Notes (Signed)
Patient came to see me today for her annual GYN exam. In 1986 she had a total abdominal hysterectomy, appendectomy for fibroids. She has never had cervical dysplasia. Her last Pap smear was 2012. She does well without hormone replacement therapy. She is having no vaginal bleeding. She is having no pelvic pain. She has osteopenia on bone density without an elevated fracture risk. She has a history of thyroid cancer. She also celiac disease and is doing well on the gluten free diet. She is taking calcium and vitamin D.  HEENT: Within normal limits.[name] present. Neck: No masses. Supraclavicular lymph nodes: Not enlarged. Breasts: Examined in both sitting and lying position. Symmetrical without skin changes or masses. Abdomen: Soft no masses guarding or rebound. No hernias. Pelvic: External within normal limits. BUS within normal limits. Vaginal examination shows good estrogen effect, no cystocele enterocele or rectocele. Cervix and uterus absent. Adnexa within normal limits. Rectovaginal confirmatory. Extremities within normal limits.  Assessment: #1. Osteopenia #2. Thyroid cancer #3. Celiac disease  Plan: Mammogram. Bone density 2014. Pap not done.The new Pap smear guidelines were discussed with the patient.

## 2012-02-16 NOTE — Patient Instructions (Signed)
Go for mammogram. Bone density in  2014.

## 2012-02-17 LAB — URINALYSIS W MICROSCOPIC + REFLEX CULTURE
Bacteria, UA: NONE SEEN
Bilirubin Urine: NEGATIVE
Casts: NONE SEEN
Glucose, UA: NEGATIVE mg/dL
Hgb urine dipstick: NEGATIVE
Ketones, ur: NEGATIVE mg/dL
Leukocytes, UA: NEGATIVE
pH: 8 (ref 5.0–8.0)

## 2012-02-22 ENCOUNTER — Encounter: Payer: Self-pay | Admitting: Obstetrics and Gynecology

## 2012-02-28 ENCOUNTER — Ambulatory Visit
Admission: RE | Admit: 2012-02-28 | Discharge: 2012-02-28 | Disposition: A | Discharge status: Home / Self Care | Payer: BC Managed Care – PPO | Source: Ambulatory Visit | Attending: Obstetrics and Gynecology | Admitting: Obstetrics and Gynecology

## 2012-02-28 ENCOUNTER — Telehealth (INDEPENDENT_AMBULATORY_CARE_PROVIDER_SITE_OTHER): Payer: Self-pay | Admitting: General Surgery

## 2012-02-28 DIAGNOSIS — Z1231 Encounter for screening mammogram for malignant neoplasm of breast: Secondary | ICD-10-CM

## 2012-02-28 NOTE — Telephone Encounter (Signed)
Message copied by Juanita Laster on Tue Feb 28, 2012 11:38 AM ------      Message from: Dois Davenport      Created: Tue Feb 28, 2012 10:12 AM       Pt called stating Dr Hassell Done spoke with Tonna Corner and was to move pt appt from 03-09-12 to a sooner appt. Please review with Dr Hassell Done. Pt can be reached at [phone]. Thanks [employer]

## 2012-02-28 NOTE — Telephone Encounter (Signed)
LMOM letting pt know that I moved her appt up to 10/28 at 12:00

## 2012-03-05 ENCOUNTER — Ambulatory Visit (INDEPENDENT_AMBULATORY_CARE_PROVIDER_SITE_OTHER): Payer: BC Managed Care – PPO | Admitting: Surgery

## 2012-03-05 ENCOUNTER — Encounter (INDEPENDENT_AMBULATORY_CARE_PROVIDER_SITE_OTHER): Payer: Self-pay | Admitting: Surgery

## 2012-03-05 VITALS — BP 124/76 | HR 70 | Temp 97.3°F | Resp 16 | Ht 66.0 in | Wt 155.0 lb

## 2012-03-05 DIAGNOSIS — IMO0001 Reserved for inherently not codable concepts without codable children: Secondary | ICD-10-CM

## 2012-03-05 DIAGNOSIS — K219 Gastro-esophageal reflux disease without esophagitis: Secondary | ICD-10-CM

## 2012-03-05 NOTE — Patient Instructions (Signed)
Nissen Fundoplication Care After Please read the instructions outlined below and refer to this sheet for the next few weeks. These discharge instructions provide you with general information on caring for yourself after you leave the hospital. Your doctor may also give you specific instructions. While your treatment has been planned according to the most current medical practices available, unavoidable complications sometimes happen. If you have any problems or questions after discharge, please call your doctor. ACTIVITY  Take frequent rest periods throughout the day.  Take frequent walks throughout the day. This will help to prevent blood clots.  Continue to do your coughing and deep breathing exercises once you get home. This will help to prevent pneumonia.  No strenuous activities such as heavy lifting, pushing or pulling until after your follow-up visit with your doctor. Do not lift anything heavier than 10 pounds.  Talk with your caregiver about when you may return to work and your exercise routine.  You may shower 2 days after surgery. Pat incisions dry. Do not rub incisions with washcloth or towel.  Do not drive while taking prescription pain medication. NUTRITION  Continue with a liquid diet, or the diet you were directed to take, until your first follow-up visit with your surgeon.  Drink fluids (6-8 glasses a day).  Call your caregiver for persistent nausea (feeling sick to your stomach), vomiting, bloating or difficulty swallowing. ELIMINATION It is very important not to strain during bowel movements. If constipation should occur, you may:  Take a mild laxative (such as Milk of Magnesia).  Add fruit and bran to your diet.  Drink more fluids.  Call your caregiver if constipation is not relieved. FEVER If you feel feverish or have shaking chills, take your temperature. If it is 66 F (38.9 C) or above, call your caregiver. The fever may mean there is an infection. PAIN  CONTROL  If a prescription was given for a pain reliever, please follow your caregiver's directions.  Only take over-the-counter or prescription medicines for pain, discomfort, or fever as directed by your caregiver.  If the pain is not relieved by your medicine, becomes worse, or you have difficulty breathing, call your doctor. INCISION  It is normal for your cuts (incisions) from surgery to have a small amount of drainage for the first 1-2 days. Once the drainage has stopped, leave your incision(s) open to air.  Check your incision(s) and surrounding area daily for any redness, swelling, increased drainage or bleeding. If any of these are present or if the wound edges start to separate, call your doctor.  If you have small adhesive strips in place, they will peel and fall off. (If these strips are covered with a clear bandage, your doctor will tell you when to remove them.)  If you have staples, your caregiver will remove them at the follow-up appointment. Document Released: 12/17/2003 Document Revised: 07/18/2011 Document Reviewed: 03/22/2007 [facility] Patient Information 2013 [place].

## 2012-03-05 NOTE — Progress Notes (Signed)
Chief Complaint:  GERD   History of Present Illness:  Amanda [name] is an 61 y.o. female comes in with her husband to discuss scheduling surgery.  I had seen her previously in May of 2012 and we had a very long discussion about GERD and her symptoms. She still has all the information that I gave her about the operation and is wanting to him get this scheduled. Previously she had a normal manometry was felt to be a good candidate for the surgery. She has had celiac sprue and is managing that with the diet. She is currently taking AcipHex twice daily but still has morning breakthrough acid in her mouth which is beginning to affect her teeth. They want to go ahead and get this scheduled stone and will followup with getting this on the schedule.  Past Medical History  Diagnosis Date  . Celiac disease     Gluten free diet  . Fibroid   . Elevated cholesterol   . Restless leg syndrome   . Myalgia and myositis, unspecified   . Osteoarthrosis, unspecified whether generalized or localized, unspecified site   . Insomnia   . Other abnormal blood chemistry   . Other symptoms involving cardiovascular system   . Osteopenia   . Hiatal hernia   . Reflux   . STD (sexually transmitted disease)     HSV  . GERD (gastroesophageal reflux disease)   . Thyroid cancer   . Change in voice   . Bruises easily     Past Surgical History  Procedure Date  . Knee surgery 2002    both knees  . Thyroid surgery     Partial thyroidectomy  . Abdominal hysterectomy 1986    TAH  . Breast surgery 1999    Reduction  . Brain surgery 2010    hemorrhage    Current Outpatient Prescriptions  Medication Sig Dispense Refill  . Alum Hydroxide-Mag Carbonate (GAVISCON PO) Take by mouth as directed.      [name] atorvastatin (LIPITOR) 40 MG tablet Take 40 mg by mouth daily.      . Calcium Carbonate-Vitamin D (CALCIUM + D PO) Take by mouth 2 (two) times daily.        . Cholecalciferol (VITAMIN D PO) Take 1,000 Units by mouth.         . clonazePAM (KLONOPIN) 0.5 MG tablet Take 0.5 mg by mouth at bedtime as needed.        [name] METOPROLOL SUCCINATE ER PO Take by mouth.      . Multiple Vitamin (MULTIVITAMIN) tablet Take 1 tablet by mouth daily.        [name] PARoxetine (PAXIL) 10 MG tablet Takes 1/2 tablet by mouth once daily      . RABEprazole (ACIPHEX) 20 MG tablet Take 1 tablet (20 mg total) by mouth 2 (two) times daily.  60 tablet  6  . RABEprazole (ACIPHEX) 20 MG tablet Take 20 mg by mouth 2 (two) times daily.      . RESTASIS 0.05 % ophthalmic emulsion Place 1 drop into both eyes daily.      [name] ROPINIROLE HCL PO Take by mouth Nightly.       . sucralfate (CARAFATE) 1 G tablet Take 1 tablet (1 g total) by mouth 4 (four) times daily.  120 tablet  1  . valACYclovir (VALTREX) 500 MG tablet TAKE 1 TABLET EVERY DAY  90 tablet  3   Cortisone; Dicloxacillin; Dilantin; Erythromycin; Levofloxacin; Zolpidem tartrate; Zolpidem tartrate; and Phenytoin sodium  extended Family History  Problem Relation Age of Onset  . Diabetes Maternal Aunt   . Breast cancer Maternal Aunt     Maternal Great aunt-Age 31's  . Heart disease Maternal Grandfather   . Hyperlipidemia Maternal Grandfather   . Hypertension Paternal Grandfather   . Liver cancer Father   . Colon cancer Neg Hx    Social History:   reports that she has quit smoking. She has never used smokeless tobacco. She reports that she drinks about .5 ounces of alcohol per week. She reports that she does not use illicit drugs.   REVIEW OF SYSTEMS - PERTINENT POSITIVES ONLY: noncontributory  Physical Exam:   Blood pressure 124/76, pulse 70, temperature 97.3 F (36.3 C), temperature source Temporal, resp. rate 16, height 5' 6"  (1.676 m), weight 155 lb (70.308 kg). Body mass index is 25.02 kg/(m^2).  Gen:  WDWN WF NAD  Neurological: Alert and oriented to person, place, and time. Motor and sensory function is grossly intact  Head: Normocephalic and atraumatic.  Eyes: Conjunctivae are  normal. Pupils are equal, round, and reactive to light. No scleral icterus.  Neck: Normal range of motion. Neck supple. No tracheal deviation or thyromegaly present.  Cardiovascular:  SR without murmurs or gallops.  No carotid bruits Respiratory: Effort normal.  No respiratory distress. No chest wall tenderness. Breath sounds normal.  No wheezes, rales or rhonchi.  Abdomen:  nontender GU: Musculoskeletal: Normal range of motion. Extremities are nontender. No cyanosis, edema or clubbing noted Lymphadenopathy: No cervical, preauricular, postauricular or axillary adenopathy is present Skin: Skin is warm and dry. No rash noted. No diaphoresis. No erythema. No pallor. Pscyh: Normal mood and affect. Behavior is normal. Judgment and thought content normal.   LABORATORY RESULTS: No results found for this or any previous visit (from the past 48 hour(s)).  RADIOLOGY RESULTS: No results found.  Problem List: Patient Active Problem List  Diagnosis  . HYPOGLYCEMIA  . HYPERLIPIDEMIA  . INSOMNIA-SLEEP DISORDER-UNSPEC  . SUBDURAL HEMATOMA  . SINUSITIS, ACUTE  . URI  . ACUTE BRONCHITIS  . UNSPECIFIED SINUSITIS  . ALLERGIC RHINITIS  . ACID REFLUX DISEASE  . GASTRITIS  . DUODENITIS  . HIATAL HERNIA  . IRRITABLE BOWEL SYNDROME  . CELIAC DISEASE  . UTI  . DEGENERATIVE JOINT DISEASE, MILD  . BACK PAIN, LUMBAR  . OSTEOPENIA  . CAROTID BRUIT, LEFT  . URINARY FREQUENCY  . EPIGASTRIC PAIN  . HYPERGLYCEMIA  . THYROID CANCER, HX OF  . FATIGUE  . Fibroid  . Chest pain, atypical  . Other chest pain  . Reflux  . STD (sexually transmitted disease)    Assessment & Plan: Gastroesophageal reflux disease dating back several years. Normal manometry. Plan laparoscopic Nissen fundoplication    Matt B. Hassell Done, MD, [facility] [phone] beeper [phone]  03/05/2012 1:21 PM

## 2012-03-09 ENCOUNTER — Encounter (INDEPENDENT_AMBULATORY_CARE_PROVIDER_SITE_OTHER): Payer: BC Managed Care – PPO | Admitting: Surgery

## 2012-04-12 ENCOUNTER — Ambulatory Visit: Admit: 2012-04-12 | Payer: Self-pay | Admitting: Surgery

## 2012-04-12 SURGERY — FUNDOPLICATION, NISSEN, LAPAROSCOPIC
Anesthesia: General

## 2012-04-17 ENCOUNTER — Telehealth: Payer: Self-pay | Admitting: Gastroenterology

## 2012-04-17 NOTE — Telephone Encounter (Signed)
Left message for pt to call back  °

## 2012-04-17 NOTE — Telephone Encounter (Signed)
lmom for pt to call back

## 2012-04-18 NOTE — Telephone Encounter (Signed)
Pt called to discuss her past procedures, EGD and Manometry, d/t she is scheduled for a Fundoplication in January. She was referred to Dr Kaylyn Lim after an EGD March 2012. She wonders if she needs the surgery and if she needs to be on maximum strength PPIs. She thought maybe she didn't have enough acid so she started taking Apple Cider Vinegar with the [facility] and she started feeling better for a while. Now she has bloating and is constipated again. She asked for the [facility] and EGD reports, so I left copies at the front desk.

## 2012-04-19 ENCOUNTER — Encounter (INDEPENDENT_AMBULATORY_CARE_PROVIDER_SITE_OTHER): Payer: BC Managed Care – PPO | Admitting: Surgery

## 2012-05-03 ENCOUNTER — Encounter (INDEPENDENT_AMBULATORY_CARE_PROVIDER_SITE_OTHER): Payer: Self-pay | Admitting: Surgery

## 2012-05-03 ENCOUNTER — Ambulatory Visit (INDEPENDENT_AMBULATORY_CARE_PROVIDER_SITE_OTHER): Payer: BC Managed Care – PPO | Admitting: Surgery

## 2012-05-03 VITALS — BP 126/80 | HR 63 | Temp 97.5°F | Resp 18 | Ht 65.0 in | Wt 156.0 lb

## 2012-05-03 DIAGNOSIS — IMO0001 Reserved for inherently not codable concepts without codable children: Secondary | ICD-10-CM

## 2012-05-03 DIAGNOSIS — K219 Gastro-esophageal reflux disease without esophagitis: Secondary | ICD-10-CM

## 2012-05-03 NOTE — Patient Instructions (Signed)
Let us know how the vinegar supplement does over the next several months.   We can rethink surgery if necessary.

## 2012-05-03 NOTE — Progress Notes (Signed)
Amanda [name] 61 y.o.  Body mass index is 25.96 kg/(m^2).  Patient Active Problem List  Diagnosis  . HYPOGLYCEMIA  . HYPERLIPIDEMIA  . INSOMNIA-SLEEP DISORDER-UNSPEC  . SUBDURAL HEMATOMA  . SINUSITIS, ACUTE  . URI  . ACUTE BRONCHITIS  . UNSPECIFIED SINUSITIS  . ALLERGIC RHINITIS  . ACID REFLUX DISEASE  . GASTRITIS  . DUODENITIS  . HIATAL HERNIA  . IRRITABLE BOWEL SYNDROME  . CELIAC DISEASE  . UTI  . DEGENERATIVE JOINT DISEASE, MILD  . BACK PAIN, LUMBAR  . OSTEOPENIA  . CAROTID BRUIT, LEFT  . URINARY FREQUENCY  . EPIGASTRIC PAIN  . HYPERGLYCEMIA  . THYROID CANCER, HX OF  . FATIGUE  . Fibroid  . Chest pain, atypical  . Other chest pain  . Reflux  . STD (sexually transmitted disease)    Allergies  Allergen Reactions  . Cortisone     REACTION: "mania" post ESI  . Dicloxacillin Other (See Comments)    "stomach"  . Dilantin (Phenytoin Sodium Extended)   . Erythromycin Other (See Comments)    "eats stomach up"  . Levofloxacin     REACTION: SPASMS  . Zolpidem Tartrate   . Zolpidem Tartrate Other (See Comments)    "bolt lighting in head"  . Phenytoin Sodium Extended Rash    Dilantin    Past Surgical History  Procedure Date  . Knee surgery 2002    both knees  . Thyroid surgery     Partial thyroidectomy  . Abdominal hysterectomy 1986    TAH  . Breast surgery 1999    Reduction  . Brain surgery 2010    hemorrhage   PANG,RICHARD, MD No diagnosis found.  Mr. & Mrs. Maxham returned to discuss her recent improvement taking [employer] Apple Cider vinegar supplement.  She takes 2 teaspoons in 8 ounce class of water upon awakening and before meals. This has been done along with stopping her PPI. She says that her reflux symptoms and discomfort are gone. She would like to cancel her surgery for now. I think that's appropriate since she's not had any symptoms. We can re\re think this should this treatment modality failed. I would be glad to see her again in the  future if needed should her reflux symptoms recur. Matt B. Hassell Done, MD, [facility] [phone] beeper [phone]  05/03/2012 10:41 AM

## 2012-05-23 ENCOUNTER — Ambulatory Visit (HOSPITAL_COMMUNITY): Admission: RE | Admit: 2012-05-23 | Payer: BC Managed Care – PPO | Source: Ambulatory Visit | Admitting: Surgery

## 2012-05-23 ENCOUNTER — Encounter (HOSPITAL_COMMUNITY): Admission: RE | Payer: Self-pay | Source: Ambulatory Visit

## 2012-05-23 SURGERY — FUNDOPLICATION, NISSEN, LAPAROSCOPIC
Anesthesia: General

## 2012-06-08 ENCOUNTER — Encounter (INDEPENDENT_AMBULATORY_CARE_PROVIDER_SITE_OTHER): Payer: BC Managed Care – PPO | Admitting: Surgery

## 2012-07-20 ENCOUNTER — Emergency Department (HOSPITAL_BASED_OUTPATIENT_CLINIC_OR_DEPARTMENT_OTHER)
Admission: EM | Admit: 2012-07-20 | Discharge: 2012-07-20 | Disposition: A | Discharge status: Home / Self Care | Payer: BC Managed Care – PPO | Attending: Emergency Medicine | Admitting: Emergency Medicine

## 2012-07-20 ENCOUNTER — Encounter (HOSPITAL_BASED_OUTPATIENT_CLINIC_OR_DEPARTMENT_OTHER): Payer: Self-pay

## 2012-07-20 DIAGNOSIS — Z8679 Personal history of other diseases of the circulatory system: Secondary | ICD-10-CM | POA: Insufficient documentation

## 2012-07-20 DIAGNOSIS — Z87891 Personal history of nicotine dependence: Secondary | ICD-10-CM | POA: Insufficient documentation

## 2012-07-20 DIAGNOSIS — M1990 Unspecified osteoarthritis, unspecified site: Secondary | ICD-10-CM | POA: Insufficient documentation

## 2012-07-20 DIAGNOSIS — Z85850 Personal history of malignant neoplasm of thyroid: Secondary | ICD-10-CM | POA: Insufficient documentation

## 2012-07-20 DIAGNOSIS — R112 Nausea with vomiting, unspecified: Secondary | ICD-10-CM | POA: Insufficient documentation

## 2012-07-20 DIAGNOSIS — K529 Noninfective gastroenteritis and colitis, unspecified: Secondary | ICD-10-CM

## 2012-07-20 DIAGNOSIS — Z8619 Personal history of other infectious and parasitic diseases: Secondary | ICD-10-CM | POA: Insufficient documentation

## 2012-07-20 DIAGNOSIS — Z79899 Other long term (current) drug therapy: Secondary | ICD-10-CM | POA: Insufficient documentation

## 2012-07-20 DIAGNOSIS — Z8719 Personal history of other diseases of the digestive system: Secondary | ICD-10-CM | POA: Insufficient documentation

## 2012-07-20 DIAGNOSIS — K219 Gastro-esophageal reflux disease without esophagitis: Secondary | ICD-10-CM | POA: Insufficient documentation

## 2012-07-20 DIAGNOSIS — Z8742 Personal history of other diseases of the female genital tract: Secondary | ICD-10-CM | POA: Insufficient documentation

## 2012-07-20 DIAGNOSIS — A088 Other specified intestinal infections: Secondary | ICD-10-CM | POA: Insufficient documentation

## 2012-07-20 DIAGNOSIS — IMO0001 Reserved for inherently not codable concepts without codable children: Secondary | ICD-10-CM | POA: Insufficient documentation

## 2012-07-20 DIAGNOSIS — G2581 Restless legs syndrome: Secondary | ICD-10-CM | POA: Insufficient documentation

## 2012-07-20 DIAGNOSIS — E7800 Pure hypercholesterolemia, unspecified: Secondary | ICD-10-CM | POA: Insufficient documentation

## 2012-07-20 LAB — COMPREHENSIVE METABOLIC PANEL
ALT: 55 U/L — ABNORMAL HIGH (ref 0–35)
Alkaline Phosphatase: 97 U/L (ref 39–117)
CO2: 23 mEq/L (ref 19–32)
Calcium: 9.2 mg/dL (ref 8.4–10.5)
Chloride: 104 mEq/L (ref 96–112)
GFR calc Af Amer: >90 mL/min (ref >90)
GFR calc non Af Amer: >90 mL/min (ref >90)
Glucose, Bld: 129 mg/dL — ABNORMAL HIGH (ref 70–99)
Sodium: 142 mEq/L (ref 135–145)
Total Bilirubin: 0.6 mg/dL (ref 0.3–1.2)

## 2012-07-20 LAB — URINALYSIS, ROUTINE W REFLEX MICROSCOPIC
Glucose, UA: NEGATIVE mg/dL
Hgb urine dipstick: NEGATIVE
Specific Gravity, Urine: 1.019 (ref 1.005–1.030)
Urobilinogen, UA: 0.2 mg/dL (ref 0.0–1.0)

## 2012-07-20 LAB — CBC WITH DIFFERENTIAL/PLATELET
Eosinophils Relative: 0 % (ref 0–5)
HCT: 41.9 % (ref 36.0–46.0)
Lymphocytes Relative: 1 % — ABNORMAL LOW (ref 12–46)
Lymphs Abs: 0.2 10*3/uL — ABNORMAL LOW (ref 0.7–4.0)
MCV: 89.5 fL (ref 78.0–100.0)
Platelets: 180 10*3/uL (ref 150–400)
RBC: 4.68 MIL/uL (ref 3.87–5.11)
WBC: 13.3 10*3/uL — ABNORMAL HIGH (ref 4.0–10.5)

## 2012-07-20 MED ORDER — ONDANSETRON HCL 8 MG PO TABS: 8.0000 mg | ORAL_TABLET | Freq: Three times a day (TID) | ORAL | Status: DC | PRN

## 2012-07-20 MED ORDER — SODIUM CHLORIDE 0.9 % IV BOLUS (SEPSIS)
1000.0000 mL | Freq: Once | INTRAVENOUS | Status: AC
  Administered 2012-07-20: 1000 mL via INTRAVENOUS

## 2012-07-20 MED ORDER — KETOROLAC TROMETHAMINE 30 MG/ML IJ SOLN
30.0000 mg | Freq: Once | INTRAMUSCULAR | Status: AC
  Administered 2012-07-20: 30 mg via INTRAVENOUS
  Filled 2012-07-20: qty 1

## 2012-07-20 MED ORDER — ONDANSETRON HCL 4 MG/2ML IJ SOLN
4.0000 mg | Freq: Once | INTRAMUSCULAR | Status: AC
  Administered 2012-07-20: 4 mg via INTRAVENOUS
  Filled 2012-07-20: qty 2

## 2012-07-20 NOTE — ED Notes (Signed)
Pt reports nausea, vomiting and diarrhea that started at 0230.  States she reheated beans last pm for dinner and was "sick" afterwards.

## 2012-07-20 NOTE — ED Notes (Signed)
Pt. Was ambulatory upon discharge, however she stated that she felt "weak and shaky". This RN helped the pt walk to check out, then security brought the patient a wheel chair to go to Pharmacy. Pt is unsure of when her ride would get to the department, so she is waiting in the waiting room. Pt instructed to notify registration staff if she needs anything or her condition changes. Registration staff notified of the pt's presence in the waiting.

## 2012-07-20 NOTE — ED Provider Notes (Signed)
History     CSN: [id number]  Arrival date & time 07/20/12  1032   First MD Initiated Contact with Patient 07/20/12 1119      Chief Complaint  Patient presents with  . Emesis  . Diarrhea    (Consider location/radiation/quality/duration/timing/severity/associated sxs/prior treatment) HPI Comments: Patient started last night with n/v/d last has persisted all night long.  She denies fevers, chills.  She does report some generalized abd cramping but otherwise no pain.  No urinary complaints.  Patient is a 62 y.o. female presenting with vomiting. The history is provided by the patient.  Emesis Severity:  Severe Duration:  12 hours Timing:  Constant Quality:  Stomach contents Progression:  Unchanged Chronicity:  New Recent urination:  Normal Worsened by:  Nothing tried Ineffective treatments:  None tried   Past Medical History  Diagnosis Date  . Celiac disease     Gluten free diet  . Fibroid   . Elevated cholesterol   . Restless leg syndrome   . Myalgia and myositis, unspecified   . Osteoarthrosis, unspecified whether generalized or localized, unspecified site   . Insomnia   . Other abnormal blood chemistry   . Other symptoms involving cardiovascular system   . Osteopenia   . Hiatal hernia   . Reflux   . STD (sexually transmitted disease)     HSV  . GERD (gastroesophageal reflux disease)   . Thyroid cancer   . Change in voice   . Bruises easily     Past Surgical History  Procedure Laterality Date  . Knee surgery  2002    both knees  . Thyroid surgery      Partial thyroidectomy  . Abdominal hysterectomy  1986    TAH  . Breast surgery  1999    Reduction  . Brain surgery  2010    hemorrhage    Family History  Problem Relation Age of Onset  . Diabetes Maternal Aunt   . Breast cancer Maternal Aunt     Maternal Great aunt-Age 22's  . Heart disease Maternal Grandfather   . Hyperlipidemia Maternal Grandfather   . Hypertension Paternal Grandfather   . Liver  cancer Father   . Colon cancer Neg Hx     History  Substance Use Topics  . Smoking status: Former Research scientist (life sciences)  . Smokeless tobacco: Never Used  . Alcohol Use: 0.5 oz/week    1 drink(s) per week    OB History   Grav Para Term Preterm Abortions TAB SAB Ect Mult Living   2 2 2       2       Review of Systems  Gastrointestinal: Positive for vomiting.  All other systems reviewed and are negative.    Allergies  Cortisone; Dicloxacillin; Dilantin; Erythromycin; Levofloxacin; Zolpidem tartrate; Zolpidem tartrate; and Phenytoin sodium extended  Home Medications   Current Outpatient Rx  Name  Route  Sig  Dispense  Refill  . Alum Hydroxide-Mag Carbonate (GAVISCON PO)   Oral   Take by mouth as directed.         [name] atorvastatin (LIPITOR) 40 MG tablet   Oral   Take 20 mg by mouth daily.          . Calcium Carbonate-Vitamin D (CALCIUM + D PO)   Oral   Take by mouth 2 (two) times daily.           . Cholecalciferol (VITAMIN D PO)   Oral   Take 1,000 Units by mouth.           [name]  clonazePAM (KLONOPIN) 0.5 MG tablet   Oral   Take 0.5 mg by mouth at bedtime as needed.           [name] METOPROLOL SUCCINATE ER PO   Oral   Take by mouth.         . Multiple Vitamin (MULTIVITAMIN) tablet   Oral   Take 1 tablet by mouth daily.           [name] PARoxetine (PAXIL) 10 MG tablet      Takes 1/2 tablet by mouth once daily         . EXPIRED: RABEprazole (ACIPHEX) 20 MG tablet   Oral   Take 1 tablet (20 mg total) by mouth 2 (two) times daily.   60 tablet   6   . RABEprazole (ACIPHEX) 20 MG tablet   Oral   Take 20 mg by mouth 2 (two) times daily.         . RESTASIS 0.05 % ophthalmic emulsion   Both Eyes   Place 1 drop into both eyes daily.         [name] ROPINIROLE HCL PO   Oral   Take by mouth Nightly.          . sucralfate (CARAFATE) 1 G tablet   Oral   Take 1 tablet (1 g total) by mouth 4 (four) times daily.   120 tablet   1   . valACYclovir (VALTREX) 500 MG  tablet      TAKE 1 TABLET EVERY DAY   90 tablet   3     There were no vitals taken for this visit.  Physical Exam  Nursing note and vitals reviewed. Constitutional: She is oriented to person, place, and time. She appears well-developed and well-nourished. No distress.  HENT:  Head: Normocephalic and atraumatic.  Neck: Normal range of motion. Neck supple.  Cardiovascular: Normal rate and regular rhythm.  Exam reveals no gallop and no friction rub.   No murmur heard. Pulmonary/Chest: Effort normal and breath sounds normal. No respiratory distress. She has no wheezes.  Abdominal: Soft. Bowel sounds are normal. She exhibits no distension. There is no tenderness.  Musculoskeletal: Normal range of motion.  Neurological: She is alert and oriented to person, place, and time.  Skin: Skin is warm and dry. She is not diaphoretic.    ED Course  Procedures (including critical care time)  Labs Reviewed  URINALYSIS, ROUTINE W REFLEX MICROSCOPIC  CBC WITH DIFFERENTIAL  COMPREHENSIVE METABOLIC PANEL   No results found.   No diagnosis found.    MDM  The presentation, exam, and workup is consistent with viral gastroenteritis.   She is feeling better with ivf's, meds.  Will discharge to home with zofran, return prn.        Veryl Speak, MD 07/20/12 1321

## 2012-07-23 ENCOUNTER — Other Ambulatory Visit: Payer: Self-pay | Admitting: Otolaryngology

## 2012-07-23 DIAGNOSIS — R42 Dizziness and giddiness: Secondary | ICD-10-CM

## 2012-08-16 ENCOUNTER — Ambulatory Visit
Admission: RE | Admit: 2012-08-16 | Discharge: 2012-08-16 | Disposition: A | Discharge status: Home / Self Care | Payer: BC Managed Care – PPO | Source: Ambulatory Visit | Attending: Otolaryngology | Admitting: Otolaryngology

## 2012-08-16 DIAGNOSIS — R42 Dizziness and giddiness: Secondary | ICD-10-CM

## 2012-08-16 MED ORDER — GADOBENATE DIMEGLUMINE 529 MG/ML IV SOLN
13.0000 mL | Freq: Once | INTRAVENOUS | Status: AC | PRN
  Administered 2012-08-16: 13 mL via INTRAVENOUS

## 2012-10-16 ENCOUNTER — Emergency Department (HOSPITAL_BASED_OUTPATIENT_CLINIC_OR_DEPARTMENT_OTHER): Payer: BC Managed Care – PPO

## 2012-10-16 ENCOUNTER — Emergency Department (HOSPITAL_BASED_OUTPATIENT_CLINIC_OR_DEPARTMENT_OTHER)
Admission: EM | Admit: 2012-10-16 | Discharge: 2012-10-16 | Disposition: A | Discharge status: Home / Self Care | Payer: BC Managed Care – PPO | Attending: Emergency Medicine | Admitting: Emergency Medicine

## 2012-10-16 ENCOUNTER — Encounter (HOSPITAL_BASED_OUTPATIENT_CLINIC_OR_DEPARTMENT_OTHER): Payer: Self-pay | Admitting: *Deleted

## 2012-10-16 DIAGNOSIS — R51 Headache: Secondary | ICD-10-CM | POA: Insufficient documentation

## 2012-10-16 DIAGNOSIS — K219 Gastro-esophageal reflux disease without esophagitis: Secondary | ICD-10-CM | POA: Insufficient documentation

## 2012-10-16 DIAGNOSIS — Z8559 Personal history of malignant neoplasm of other urinary tract organ: Secondary | ICD-10-CM | POA: Insufficient documentation

## 2012-10-16 DIAGNOSIS — Z85850 Personal history of malignant neoplasm of thyroid: Secondary | ICD-10-CM | POA: Insufficient documentation

## 2012-10-16 DIAGNOSIS — Z8619 Personal history of other infectious and parasitic diseases: Secondary | ICD-10-CM | POA: Insufficient documentation

## 2012-10-16 DIAGNOSIS — M542 Cervicalgia: Secondary | ICD-10-CM

## 2012-10-16 DIAGNOSIS — Z8719 Personal history of other diseases of the digestive system: Secondary | ICD-10-CM | POA: Insufficient documentation

## 2012-10-16 DIAGNOSIS — Z87891 Personal history of nicotine dependence: Secondary | ICD-10-CM | POA: Insufficient documentation

## 2012-10-16 DIAGNOSIS — E7800 Pure hypercholesterolemia, unspecified: Secondary | ICD-10-CM | POA: Insufficient documentation

## 2012-10-16 DIAGNOSIS — Z79899 Other long term (current) drug therapy: Secondary | ICD-10-CM | POA: Insufficient documentation

## 2012-10-16 DIAGNOSIS — R519 Headache, unspecified: Secondary | ICD-10-CM

## 2012-10-16 DIAGNOSIS — Z8739 Personal history of other diseases of the musculoskeletal system and connective tissue: Secondary | ICD-10-CM | POA: Insufficient documentation

## 2012-10-16 MED ORDER — KETOROLAC TROMETHAMINE 30 MG/ML IJ SOLN
30.0000 mg | Freq: Once | INTRAMUSCULAR | Status: AC
  Administered 2012-10-16: 30 mg via INTRAVENOUS
  Filled 2012-10-16: qty 1

## 2012-10-16 MED ORDER — DIAZEPAM 5 MG/ML IJ SOLN
5.0000 mg | Freq: Once | INTRAMUSCULAR | Status: AC
  Administered 2012-10-16: 5 mg via INTRAVENOUS
  Filled 2012-10-16: qty 2

## 2012-10-16 MED ORDER — DIAZEPAM 5 MG PO TABS: 5.0000 mg | ORAL_TABLET | Freq: Four times a day (QID) | ORAL | Status: DC | PRN

## 2012-10-16 NOTE — ED Provider Notes (Signed)
History     CSN: [id number]  Arrival date & time 10/16/12  1649   First MD Initiated Contact with Patient 10/16/12 1700      Chief Complaint  Patient presents with  . Neck Pain    (Consider location/radiation/quality/duration/timing/severity/associated sxs/prior treatment) Patient is a 62 y.o. female presenting with neck pain. The history is provided by the patient. No language interpreter was used.  Neck Pain Pain location:  L side Quality:  Aching Radiates to: head. Pain severity:  Severe Pain is:  Same all the time Onset quality:  Gradual Timing:  Intermittent Progression:  Worsening Context: not fall and not recent injury   Relieved by:  Nothing Worsened by:  Nothing tried Ineffective treatments:  None tried Associated symptoms: no chest pain, no fever, no visual change and no weakness     Past Medical History  Diagnosis Date  . Celiac disease     Gluten free diet  . Fibroid   . Elevated cholesterol   . Restless leg syndrome   . Myalgia and myositis, unspecified   . Osteoarthrosis, unspecified whether generalized or localized, unspecified site   . Insomnia   . Other abnormal blood chemistry   . Other symptoms involving cardiovascular system   . Osteopenia   . Hiatal hernia   . Reflux   . STD (sexually transmitted disease)     HSV  . GERD (gastroesophageal reflux disease)   . Thyroid cancer   . Change in voice   . Bruises easily     Past Surgical History  Procedure Laterality Date  . Knee surgery  2002    both knees  . Thyroid surgery      Partial thyroidectomy  . Abdominal hysterectomy  1986    TAH  . Breast surgery  1999    Reduction  . Brain surgery  2010    hemorrhage    Family History  Problem Relation Age of Onset  . Diabetes Maternal Aunt   . Breast cancer Maternal Aunt     Maternal Great aunt-Age 29's  . Heart disease Maternal Grandfather   . Hyperlipidemia Maternal Grandfather   . Hypertension Paternal Grandfather   . Liver  cancer Father   . Colon cancer Neg Hx     History  Substance Use Topics  . Smoking status: Former Research scientist (life sciences)  . Smokeless tobacco: Never Used  . Alcohol Use: 0.5 oz/week    1 drink(s) per week    OB History   Grav Para Term Preterm Abortions TAB SAB Ect Mult Living   2 2 2       2       Review of Systems  Constitutional: Negative for fever.  HENT: Positive for neck pain.   Respiratory: Negative.   Cardiovascular: Negative for chest pain.  Neurological: Negative for weakness.    Allergies  Trazodone and nefazodone; Ambien; Cortisone; Dicloxacillin; Dilantin; Erythromycin; Levofloxacin; Nitrofuran derivatives; Zolpidem tartrate; Zolpidem tartrate; and Phenytoin sodium extended  Home Medications   Current Outpatient Rx  Name  Route  Sig  Dispense  Refill  . Alum Hydroxide-Mag Carbonate (GAVISCON PO)   Oral   Take by mouth as directed.         [name] atorvastatin (LIPITOR) 40 MG tablet   Oral   Take 20 mg by mouth daily.          . Calcium Carbonate-Vitamin D (CALCIUM + D PO)   Oral   Take by mouth 2 (two) times daily.           [name]  Cholecalciferol (VITAMIN D PO)   Oral   Take 1,000 Units by mouth.           . clonazePAM (KLONOPIN) 0.5 MG tablet   Oral   Take 0.5 mg by mouth at bedtime as needed.           [name] METOPROLOL SUCCINATE ER PO   Oral   Take by mouth.         . Multiple Vitamin (MULTIVITAMIN) tablet   Oral   Take 1 tablet by mouth daily.           . ondansetron (ZOFRAN) 8 MG tablet   Oral   Take 1 tablet (8 mg total) by mouth every 8 (eight) hours as needed for nausea.   4 tablet   0   . PARoxetine (PAXIL) 10 MG tablet      Takes 1/2 tablet by mouth once daily         . EXPIRED: RABEprazole (ACIPHEX) 20 MG tablet   Oral   Take 1 tablet (20 mg total) by mouth 2 (two) times daily.   60 tablet   6   . RABEprazole (ACIPHEX) 20 MG tablet   Oral   Take 20 mg by mouth 2 (two) times daily.         . RESTASIS 0.05 % ophthalmic  emulsion   Both Eyes   Place 1 drop into both eyes daily.         [name] ROPINIROLE HCL PO   Oral   Take by mouth Nightly.          . valACYclovir (VALTREX) 500 MG tablet      TAKE 1 TABLET EVERY DAY   90 tablet   3     BP 172/83  Pulse 61  Temp(Src) 98.1 F (36.7 C) (Oral)  Resp 20  Wt 150 lb (68.04 kg)  BMI 24.96 kg/m2  SpO2 100%  Physical Exam  Nursing note and vitals reviewed. Constitutional: She is oriented to person, place, and time. She appears well-developed and well-nourished.  HENT:  Head: Normocephalic and atraumatic.  Right Ear: External ear normal.  Left Ear: External ear normal.  Eyes: Conjunctivae and EOM are normal. Pupils are equal, round, and reactive to light.  Neck: Neck supple.  Cardiovascular: Normal rate and regular rhythm.   Pulmonary/Chest: Effort normal and breath sounds normal.  Musculoskeletal:  Left cervical paraspinal tenderness and pt unable to completely rotate her neck to the left;no meningeal symptoms  Neurological: She is alert and oriented to person, place, and time.  Skin: Skin is warm and dry.  Psychiatric: She has a normal mood and affect.    ED Course  Procedures (including critical care time)  Labs Reviewed - No data to display Dg Cervical Spine Complete  10/16/2012   *RADIOLOGY REPORT*  Clinical Data: Pain  CERVICAL SPINE - COMPLETE 4+ VIEW  Comparison: None.  Findings: Seven cervical segments are well visualized.  Vertebral body height is well-maintained.  Mild neural foraminal narrowing is noted at C5-6, C6-7 and C7-T1 bilaterally.  Mild carotid calcifications are seen.  The odontoid is within normal limits.  No acute fracture is seen.  IMPRESSION: Degenerative change without acute abnormality.   Original Report Authenticated By: Inez Catalina, M.D.   Ct Head Wo Contrast  10/16/2012   *RADIOLOGY REPORT*  Clinical Data: Headache and history of right subdural hemorrhage.  CT HEAD WITHOUT CONTRAST  Technique:  Contiguous axial  images were obtained from the base of the  skull through the vertex without contrast.  Comparison: 04/28/2011  Findings: The brain demonstrates no evidence of hemorrhage, infarction, edema, mass effect, extra-axial fluid collection, hydrocephalus or mass lesion.  The skull shows evidence of prior right frontal craniotomy.  IMPRESSION: No acute findings.   Original Report Authenticated By: Aletta Edouard, M.D.     1. Neck pain   2. Headache       MDM  Pt is feeling better at this time:think pt is likely have muscle spasms:pt to go home with valium and f/u with Dr. Jeanine Luz, NP 10/16/12 1939

## 2012-10-16 NOTE — ED Notes (Signed)
Neck pain and headache since yesterday.

## 2012-10-16 NOTE — ED Notes (Signed)
Pt. Resting with eyes closed. 

## 2012-10-16 NOTE — ED Notes (Signed)
Pt. Encouraged to rest till CT scan results.  Husband at Pt. Bedside.

## 2012-10-16 NOTE — ED Notes (Signed)
Pt. Reports neck issues for a mth or more and the headache for the last couple of days.

## 2012-10-16 NOTE — ED Notes (Signed)
Pt. Talks con't about her drug reactions and about her past medical history.  Pt. Reports to RN after RN con't to ask Pt. Why she is here today.  Pt. Reports neck pain and headache.  Pt. Is able to move neck side to side and up and down.  No distress noted in Pt.

## 2012-10-16 NOTE — ED Provider Notes (Signed)
Medical screening examination/treatment/procedure(s) were performed by non-physician practitioner and as supervising physician I was immediately available for consultation/collaboration.   Bernadette Armijo B. Karle Starch, MD 10/16/12 1940

## 2012-12-18 ENCOUNTER — Ambulatory Visit (INDEPENDENT_AMBULATORY_CARE_PROVIDER_SITE_OTHER): Payer: BC Managed Care – PPO | Admitting: Gynecology

## 2012-12-18 ENCOUNTER — Encounter: Payer: Self-pay | Admitting: Gynecology

## 2012-12-18 DIAGNOSIS — N390 Urinary tract infection, site not specified: Secondary | ICD-10-CM

## 2012-12-18 DIAGNOSIS — R319 Hematuria, unspecified: Secondary | ICD-10-CM

## 2012-12-18 DIAGNOSIS — N898 Other specified noninflammatory disorders of vagina: Secondary | ICD-10-CM

## 2012-12-18 DIAGNOSIS — L293 Anogenital pruritus, unspecified: Secondary | ICD-10-CM

## 2012-12-18 LAB — URINALYSIS W MICROSCOPIC + REFLEX CULTURE
Ketones, ur: NEGATIVE mg/dL
Nitrite: NEGATIVE
Protein, ur: NEGATIVE mg/dL
Specific Gravity, Urine: 1.015 (ref 1.005–1.030)
Urobilinogen, UA: 0.2 mg/dL (ref 0.0–1.0)

## 2012-12-18 LAB — WET PREP FOR TRICH, YEAST, CLUE
Clue Cells Wet Prep HPF POC: NONE SEEN
Yeast Wet Prep HPF POC: NONE SEEN

## 2012-12-18 MED ORDER — MOXIFLOXACIN HCL 400 MG PO TABS: 400.0000 mg | ORAL_TABLET | Freq: Every day | ORAL | Status: DC

## 2012-12-18 MED ORDER — FLUCONAZOLE 150 MG PO TABS: 150.0000 mg | ORAL_TABLET | Freq: Once | ORAL | Status: DC

## 2012-12-18 NOTE — Patient Instructions (Signed)
Take Avelox antibiotic daily for 7 days. Take Diflucan pill once. Followup if symptoms persist, worsen or recur.

## 2012-12-18 NOTE — Progress Notes (Signed)
Patient presents with several day history of suprapubic pressure, dysuria and blood in her urine. Does have a history of occasional UTIs in the past. Also complaining of some vulvar itching. No discharge or odor. No fever chills nausea vomiting or other constitutional symptoms. No back pain.  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 Abdomen soft nontender without masses guarding rebound organomegaly. Pelvic external BUS vagina with atrophic changes. No real discharge noted. Bimanual without masses or tenderness.  Assessment and plan: Symptoms and urinalysis consistent with UTI. Patient strongly desires Avelox to treat. Michela Pitcher that this seems to work well for her. Does list levofloxacin as an allergy but said she can take Avelox. Ultimately prescribed Avelox 400 mg daily x7 days. Did recommend repeating a urinalysis after treatment to make sure the hematuria clears. She has an appointment to see me in 2 months and we can do this at that time. I prescribed Diflucan 150 mg times one dose for the itching. Her wet prep was unremarkable and I am going to cover her for a yeast infection particularly with antibiotic use. Followup if symptoms persist, worsen or recur.

## 2012-12-20 ENCOUNTER — Telehealth: Payer: Self-pay | Admitting: Gynecology

## 2012-12-20 DIAGNOSIS — N390 Urinary tract infection, site not specified: Secondary | ICD-10-CM

## 2012-12-20 NOTE — Telephone Encounter (Signed)
Pt informed with the below note, order placed.

## 2012-12-20 NOTE — Telephone Encounter (Signed)
Reminded patient to repeat a clean-catch urinalysis after she is done with the antibiotics.

## 2013-01-17 ENCOUNTER — Encounter (HOSPITAL_BASED_OUTPATIENT_CLINIC_OR_DEPARTMENT_OTHER): Payer: Self-pay | Admitting: *Deleted

## 2013-01-17 ENCOUNTER — Inpatient Hospital Stay (HOSPITAL_BASED_OUTPATIENT_CLINIC_OR_DEPARTMENT_OTHER)
Admission: EM | Admit: 2013-01-17 | Discharge: 2013-01-18 | DRG: 247 | Disposition: A | Discharge status: Home / Self Care | Payer: BC Managed Care – PPO | Attending: Internal Medicine | Admitting: Internal Medicine

## 2013-01-17 ENCOUNTER — Inpatient Hospital Stay (HOSPITAL_COMMUNITY): Payer: BC Managed Care – PPO

## 2013-01-17 ENCOUNTER — Emergency Department (HOSPITAL_BASED_OUTPATIENT_CLINIC_OR_DEPARTMENT_OTHER): Payer: BC Managed Care – PPO

## 2013-01-17 DIAGNOSIS — Z85850 Personal history of malignant neoplasm of thyroid: Secondary | ICD-10-CM

## 2013-01-17 DIAGNOSIS — R519 Headache, unspecified: Secondary | ICD-10-CM | POA: Diagnosis present

## 2013-01-17 DIAGNOSIS — F519 Sleep disorder not due to a substance or known physiological condition, unspecified: Secondary | ICD-10-CM

## 2013-01-17 DIAGNOSIS — R0989 Other specified symptoms and signs involving the circulatory and respiratory systems: Secondary | ICD-10-CM

## 2013-01-17 DIAGNOSIS — D219 Benign neoplasm of connective and other soft tissue, unspecified: Secondary | ICD-10-CM

## 2013-01-17 DIAGNOSIS — R51 Headache: Secondary | ICD-10-CM

## 2013-01-17 DIAGNOSIS — M545 Low back pain, unspecified: Secondary | ICD-10-CM

## 2013-01-17 DIAGNOSIS — E7800 Pure hypercholesterolemia, unspecified: Secondary | ICD-10-CM

## 2013-01-17 DIAGNOSIS — K589 Irritable bowel syndrome without diarrhea: Secondary | ICD-10-CM

## 2013-01-17 DIAGNOSIS — I10 Essential (primary) hypertension: Secondary | ICD-10-CM

## 2013-01-17 DIAGNOSIS — G459 Transient cerebral ischemic attack, unspecified: Secondary | ICD-10-CM

## 2013-01-17 DIAGNOSIS — A64 Unspecified sexually transmitted disease: Secondary | ICD-10-CM

## 2013-01-17 DIAGNOSIS — J069 Acute upper respiratory infection, unspecified: Secondary | ICD-10-CM

## 2013-01-17 DIAGNOSIS — J309 Allergic rhinitis, unspecified: Secondary | ICD-10-CM

## 2013-01-17 DIAGNOSIS — M5412 Radiculopathy, cervical region: Secondary | ICD-10-CM

## 2013-01-17 DIAGNOSIS — M899 Disorder of bone, unspecified: Secondary | ICD-10-CM

## 2013-01-17 DIAGNOSIS — N390 Urinary tract infection, site not specified: Secondary | ICD-10-CM

## 2013-01-17 DIAGNOSIS — J209 Acute bronchitis, unspecified: Secondary | ICD-10-CM

## 2013-01-17 DIAGNOSIS — I6200 Nontraumatic subdural hemorrhage, unspecified: Secondary | ICD-10-CM

## 2013-01-17 DIAGNOSIS — Z87891 Personal history of nicotine dependence: Secondary | ICD-10-CM

## 2013-01-17 DIAGNOSIS — K449 Diaphragmatic hernia without obstruction or gangrene: Secondary | ICD-10-CM

## 2013-01-17 DIAGNOSIS — IMO0001 Reserved for inherently not codable concepts without codable children: Secondary | ICD-10-CM

## 2013-01-17 DIAGNOSIS — G2581 Restless legs syndrome: Secondary | ICD-10-CM | POA: Diagnosis present

## 2013-01-17 DIAGNOSIS — K2970 Gastritis, unspecified, without bleeding: Secondary | ICD-10-CM

## 2013-01-17 DIAGNOSIS — R002 Palpitations: Secondary | ICD-10-CM | POA: Diagnosis present

## 2013-01-17 DIAGNOSIS — K2980 Duodenitis without bleeding: Secondary | ICD-10-CM | POA: Diagnosis present

## 2013-01-17 DIAGNOSIS — R5381 Other malaise: Secondary | ICD-10-CM

## 2013-01-17 DIAGNOSIS — E162 Hypoglycemia, unspecified: Secondary | ICD-10-CM

## 2013-01-17 DIAGNOSIS — J329 Chronic sinusitis, unspecified: Secondary | ICD-10-CM

## 2013-01-17 DIAGNOSIS — J019 Acute sinusitis, unspecified: Secondary | ICD-10-CM

## 2013-01-17 DIAGNOSIS — E785 Hyperlipidemia, unspecified: Secondary | ICD-10-CM

## 2013-01-17 DIAGNOSIS — K900 Celiac disease: Secondary | ICD-10-CM | POA: Diagnosis present

## 2013-01-17 DIAGNOSIS — R7989 Other specified abnormal findings of blood chemistry: Secondary | ICD-10-CM | POA: Diagnosis present

## 2013-01-17 DIAGNOSIS — Z7982 Long term (current) use of aspirin: Secondary | ICD-10-CM

## 2013-01-17 DIAGNOSIS — Z79899 Other long term (current) drug therapy: Secondary | ICD-10-CM

## 2013-01-17 DIAGNOSIS — K219 Gastro-esophageal reflux disease without esophagitis: Secondary | ICD-10-CM | POA: Diagnosis present

## 2013-01-17 DIAGNOSIS — R29898 Other symptoms and signs involving the musculoskeletal system: Principal | ICD-10-CM | POA: Diagnosis present

## 2013-01-17 DIAGNOSIS — R0789 Other chest pain: Secondary | ICD-10-CM

## 2013-01-17 DIAGNOSIS — R1013 Epigastric pain: Secondary | ICD-10-CM

## 2013-01-17 DIAGNOSIS — C73 Malignant neoplasm of thyroid gland: Secondary | ICD-10-CM

## 2013-01-17 DIAGNOSIS — E119 Type 2 diabetes mellitus without complications: Secondary | ICD-10-CM | POA: Diagnosis present

## 2013-01-17 DIAGNOSIS — R350 Frequency of micturition: Secondary | ICD-10-CM

## 2013-01-17 DIAGNOSIS — M1990 Unspecified osteoarthritis, unspecified site: Secondary | ICD-10-CM

## 2013-01-17 LAB — COMPREHENSIVE METABOLIC PANEL
AST: 25 U/L (ref 0–37)
BUN: 19 mg/dL (ref 6–23)
CO2: 28 mEq/L (ref 19–32)
Calcium: 10.4 mg/dL (ref 8.4–10.5)
Chloride: 104 mEq/L (ref 96–112)
Creatinine, Ser: 0.9 mg/dL (ref 0.50–1.10)
GFR calc Af Amer: 78 mL/min — ABNORMAL LOW (ref >90)
GFR calc non Af Amer: 67 mL/min — ABNORMAL LOW (ref >90)
Total Bilirubin: 0.5 mg/dL (ref 0.3–1.2)

## 2013-01-17 LAB — URINALYSIS, ROUTINE W REFLEX MICROSCOPIC
Glucose, UA: NEGATIVE mg/dL
Hgb urine dipstick: NEGATIVE
Ketones, ur: NEGATIVE mg/dL
Protein, ur: NEGATIVE mg/dL
pH: 7 (ref 5.0–8.0)

## 2013-01-17 LAB — CBC WITH DIFFERENTIAL/PLATELET
Basophils Absolute: 0 10*3/uL (ref 0.0–0.1)
Eosinophils Relative: 1 % (ref 0–5)
HCT: 41.4 % (ref 36.0–46.0)
Hemoglobin: 13.7 g/dL (ref 12.0–15.0)
Lymphocytes Relative: 29 % (ref 12–46)
MCHC: 33.1 g/dL (ref 30.0–36.0)
MCV: 91.6 fL (ref 78.0–100.0)
Monocytes Absolute: 0.4 10*3/uL (ref 0.1–1.0)
Monocytes Relative: 7 % (ref 3–12)
Neutro Abs: 3.5 10*3/uL (ref 1.7–7.7)
RDW: 12.8 % (ref 11.5–15.5)
WBC: 5.5 10*3/uL (ref 4.0–10.5)

## 2013-01-17 MED ORDER — ONDANSETRON HCL 4 MG PO TABS: 4.0000 mg | ORAL_TABLET | Freq: Four times a day (QID) | ORAL | Status: DC | PRN

## 2013-01-17 MED ORDER — POLYETHYLENE GLYCOL 3350 17 G PO PACK
17.0000 g | PACK | Freq: Every day | ORAL | Status: DC | PRN
  Filled 2013-01-17: qty 1

## 2013-01-17 MED ORDER — DIPHENHYDRAMINE HCL 50 MG/ML IJ SOLN
25.0000 mg | Freq: Once | INTRAMUSCULAR | Status: AC
  Administered 2013-01-17: 15:00:00 via INTRAVENOUS
  Filled 2013-01-17: qty 1

## 2013-01-17 MED ORDER — METOCLOPRAMIDE HCL 5 MG/ML IJ SOLN
10.0000 mg | Freq: Once | INTRAMUSCULAR | Status: DC
  Filled 2013-01-17: qty 2

## 2013-01-17 MED ORDER — ALBUTEROL SULFATE (5 MG/ML) 0.5% IN NEBU: 2.5000 mg | INHALATION_SOLUTION | RESPIRATORY_TRACT | Status: DC | PRN

## 2013-01-17 MED ORDER — ONDANSETRON HCL 4 MG/2ML IJ SOLN: 4.0000 mg | Freq: Four times a day (QID) | INTRAMUSCULAR | Status: DC | PRN

## 2013-01-17 MED ORDER — ASPIRIN EC 81 MG PO TBEC
81.0000 mg | DELAYED_RELEASE_TABLET | Freq: Every day | ORAL | Status: DC
  Administered 2013-01-17 – 2013-01-18 (×2): 81 mg via ORAL
  Filled 2013-01-17 (×3): qty 1

## 2013-01-17 MED ORDER — SODIUM CHLORIDE 0.9 % IV BOLUS (SEPSIS)
1000.0000 mL | Freq: Once | INTRAVENOUS | Status: AC
  Administered 2013-01-17: 1000 mL via INTRAVENOUS

## 2013-01-17 MED ORDER — PAROXETINE HCL 10 MG PO TABS
10.0000 mg | ORAL_TABLET | Freq: Every day | ORAL | Status: DC
  Administered 2013-01-17: 10 mg via ORAL
  Filled 2013-01-17 (×2): qty 1

## 2013-01-17 MED ORDER — HYDROCODONE-ACETAMINOPHEN 5-325 MG PO TABS: 1.0000 | ORAL_TABLET | Freq: Four times a day (QID) | ORAL | Status: DC | PRN

## 2013-01-17 MED ORDER — CYCLOSPORINE 0.05 % OP EMUL
1.0000 [drp] | Freq: Every day | OPHTHALMIC | Status: DC
  Filled 2013-01-17 (×2): qty 1

## 2013-01-17 MED ORDER — ATORVASTATIN CALCIUM 20 MG PO TABS
20.0000 mg | ORAL_TABLET | Freq: Every day | ORAL | Status: DC
  Administered 2013-01-17: 10 mg via ORAL
  Filled 2013-01-17 (×3): qty 1

## 2013-01-17 MED ORDER — METOPROLOL TARTRATE 25 MG PO TABS
25.0000 mg | ORAL_TABLET | Freq: Two times a day (BID) | ORAL | Status: DC
  Administered 2013-01-17: 25 mg via ORAL
  Filled 2013-01-17 (×3): qty 1

## 2013-01-17 MED ORDER — SODIUM CHLORIDE 0.9 % IV SOLN: INTRAVENOUS | Status: DC

## 2013-01-17 MED ORDER — SODIUM CHLORIDE 0.9 % IJ SOLN
3.0000 mL | Freq: Two times a day (BID) | INTRAMUSCULAR | Status: DC
  Administered 2013-01-18: 3 mL via INTRAVENOUS

## 2013-01-17 MED ORDER — ROPINIROLE HCL 0.25 MG PO TABS
0.2500 mg | ORAL_TABLET | Freq: Every day | ORAL | Status: DC
  Administered 2013-01-17: 0.25 mg via ORAL
  Filled 2013-01-17 (×2): qty 1

## 2013-01-17 MED ORDER — CALCIUM CITRATE-VITAMIN D 315-200 MG-UNIT PO TABS: 1.0000 | ORAL_TABLET | Freq: Every day | ORAL | Status: DC

## 2013-01-17 MED ORDER — CALCIUM CARBONATE-VITAMIN D 500-200 MG-UNIT PO TABS
1.0000 | ORAL_TABLET | Freq: Two times a day (BID) | ORAL | Status: DC
  Filled 2013-01-17 (×3): qty 1

## 2013-01-17 MED ORDER — GUAIFENESIN-DM 100-10 MG/5ML PO SYRP: 5.0000 mL | ORAL_SOLUTION | ORAL | Status: DC | PRN

## 2013-01-17 MED ORDER — CLONAZEPAM 0.5 MG PO TABS
0.5000 mg | ORAL_TABLET | Freq: Two times a day (BID) | ORAL | Status: DC
  Administered 2013-01-17: 0.5 mg via ORAL
  Filled 2013-01-17: qty 1

## 2013-01-17 NOTE — H&P (Addendum)
[facility]                                                                                    Patient Demographics  Amanda [name], is a 62 y.o. female  MRN: [id number]   DOB - 1950-06-11  Admit Date - 01/17/2013  Outpatient Primary MD for the patient is [name], MD   With History of -  Past Medical History  Diagnosis Date  . Celiac disease     Gluten free diet  . Fibroid   . Elevated cholesterol   . Restless leg syndrome   . Myalgia and myositis, unspecified   . Osteoarthrosis, unspecified whether generalized or localized, unspecified site   . Insomnia   . Other abnormal blood chemistry   . Other symptoms involving cardiovascular system   . Osteopenia   . Hiatal hernia   . Reflux   . STD (sexually transmitted disease)     HSV  . GERD (gastroesophageal reflux disease)   . Thyroid cancer   . Change in voice   . Bruises easily       Past Surgical History  Procedure Laterality Date  . Knee surgery  2002    both knees  . Thyroid surgery      Partial thyroidectomy  . Abdominal hysterectomy  1986    TAH  . Breast surgery  1999    Reduction  . Brain surgery  2010    hemorrhage    in for   Chief Complaint  Patient presents with  . Dizziness     HPI  Amanda [name]  is a 62 y.o. female, subdural hematoma requiring bur hole several years ago after a fall and concussion, dyslipidemia, history of palpitations on better blocker, thyroid surgery status post partial thyroid resection, celiac disease, presents to the hospital after she started experiencing sharp pin-like sensation headaches in her occipital area yesterday evening, this was accompanied by slurred speech which lasted a few seconds and then right arm and hand heaviness, she initially tried to seek therapy at home however when her right arm hand and weakness did not improve she came to [facility] where she underwent a unremarkable workup including normal blood work and CT scan of the  head, case was then discussed by neurologist on call Dr. Aram Beecham who requested she be transferred here for further workup.  She currently has minimal headache, her slurred speech lasted only a few minutes 2 seconds, however she continues to have some heaviness mostly in the right hand area but says it seems to be resolving also.    Review of Systems    In addition to the HPI above,   No Fever-chills, Mild occipital Headache, No changes with Vision or hearing, No problems swallowing food or Liquids, No Chest pain, Cough or Shortness of Breath, No Abdominal pain, No Nausea or Vommitting, Bowel movements are regular, No Blood in stool or Urine, No dysuria, No new skin rashes or bruises, No new joints pains-aches,  No new weakness, tingling, numbness in any extremity, except in the right arm and hand area as above No recent weight gain or loss, No polyuria, polydypsia or polyphagia,  No significant Mental Stressors.  A full 10 point Review of Systems was done, except as stated above, all other Review of Systems were negative.   Social History History  Substance Use Topics  . Smoking status: Former Research scientist (life sciences)  . Smokeless tobacco: Never Used  . Alcohol Use: 0.5 oz/week    1 drink(s) per week      Family History Family History  Problem Relation Age of Onset  . Diabetes Maternal Aunt   . Breast cancer Maternal Aunt     Maternal Great aunt-Age 64's  . Heart disease Maternal Grandfather   . Hyperlipidemia Maternal Grandfather   . Hypertension Paternal Grandfather   . Liver cancer Father   . Colon cancer Neg Hx       Prior to Admission medications   Medication Sig Start Date End Date Taking? Authorizing Provider  atorvastatin (LIPITOR) 40 MG tablet Take 20 mg by mouth daily.     Historical Provider, MD  Calcium Carbonate-Vitamin D (CALCIUM + D PO) Take by mouth 2 (two) times daily.      Historical Provider, MD  Cholecalciferol (VITAMIN D PO) Take 1,000 Units by mouth.       Historical Provider, MD  clonazePAM (KLONOPIN) 0.5 MG tablet Take 0.5 mg by mouth at bedtime as needed.      Historical Provider, MD  fluconazole (DIFLUCAN) 150 MG tablet Take 1 tablet (150 mg total) by mouth once. 12/18/12   Anastasio Auerbach, MD  METOPROLOL SUCCINATE ER PO Take by mouth.    Historical Provider, MD  moxifloxacin (AVELOX) 400 MG tablet Take 1 tablet (400 mg total) by mouth daily. 12/18/12   Anastasio Auerbach, MD  Multiple Vitamin (MULTIVITAMIN) tablet Take 1 tablet by mouth daily.      Historical Provider, MD  PARoxetine (PAXIL) 10 MG tablet Takes 1/2 tablet by mouth once daily 11/13/10   Hendricks Limes, MD  RESTASIS 0.05 % ophthalmic emulsion Place 1 drop into both eyes daily. 07/24/11   Historical Provider, MD  ROPINIROLE HCL PO Take by mouth Nightly.     Historical Provider, MD  valACYclovir (VALTREX) 500 MG tablet TAKE 1 TABLET EVERY DAY 11/22/11   Bennetta Laos, MD    Allergies  Allergen Reactions  . Trazodone And Nefazodone Other (See Comments)  . Ambien [Zolpidem Tartrate]   . Cortisone     REACTION: "mania" post ESI  . Dicloxacillin Other (See Comments)    "stomach"  . Dilantin [Phenytoin Sodium Extended]   . Erythromycin Other (See Comments)    "eats stomach up"  . Levofloxacin     REACTION: SPASMS  . Nitrofuran Derivatives Other (See Comments)  . Zolpidem Tartrate   . Zolpidem Tartrate Other (See Comments)    "bolt lighting in head"  . Phenytoin Sodium Extended Rash    Dilantin    Physical Exam  Vitals  Blood pressure 137/79, pulse 64, temperature 98.5 F (36.9 C), temperature source Oral, resp. rate 16, height 5' 5"  (1.651 m), weight 68.04 kg (150 lb), SpO2 100.00%.   1. General middle-aged Caucasian female lying in bed in NAD,    2. Normal affect and insight, Not Suicidal or Homicidal, Awake Alert, Oriented X 3.  3. No F.N deficits, ALL C.Nerves Intact, Strength 5/5 all 4 extremities, Sensation intact all 4 extremities, Plantars down  going.  4. Ears and Eyes appear Normal, Conjunctivae clear, PERRLA. Moist Oral Mucosa.  5. Supple Neck, No JVD, No cervical lymphadenopathy appriciated, No Carotid Bruits.  6.  Symmetrical Chest wall movement, Good air movement bilaterally, CTAB.  7. RRR, No Gallops, Rubs or Murmurs, No Parasternal Heave.  8. Positive Bowel Sounds, Abdomen Soft, Non tender, No organomegaly appriciated,No rebound -guarding or rigidity.  9.  No Cyanosis, Normal Skin Turgor, No Skin Rash or Bruise.  10. Good muscle tone,  joints appear normal , no effusions, Normal ROM.  11. No Palpable Lymph Nodes in Neck or Axillae     Data Review  CBC  Recent Labs Lab 01/17/13 1507  WBC 5.5  HGB 13.7  HCT 41.4  PLT 218  MCV 91.6  MCH 30.3  MCHC 33.1  RDW 12.8  LYMPHSABS 1.6  MONOABS 0.4  EOSABS 0.0  BASOSABS 0.0   ------------------------------------------------------------------------------------------------------------------  Chemistries   Recent Labs Lab 01/17/13 1507  NA 141  K 3.6  CL 104  CO2 28  GLUCOSE 94  BUN 19  CREATININE 0.90  CALCIUM 10.4  AST 25  ALT 26  ALKPHOS 84  BILITOT 0.5   ------------------------------------------------------------------------------------------------------------------ estimated creatinine clearance is 58.3 ml/min (by C-G formula based on Cr of 0.9). ------------------------------------------------------------------------------------------------------------------ No results found for this basename: TSH, T4TOTAL, FREET3, T3FREE, THYROIDAB,  in the last 72 hours   Coagulation profile No results found for this basename: INR, PROTIME,  in the last 168 hours ------------------------------------------------------------------------------------------------------------------- No results found for this basename: DDIMER,  in the last 72  hours -------------------------------------------------------------------------------------------------------------------  Cardiac Enzymes No results found for this basename: CK, CKMB, TROPONINI, MYOGLOBIN,  in the last 168 hours ------------------------------------------------------------------------------------------------------------------ No components found with this basename: POCBNP,    ---------------------------------------------------------------------------------------------------------------  Urinalysis    Component Value Date/Time   COLORURINE YELLOW 01/17/2013 1400   APPEARANCEUR CLOUDY* 01/17/2013 1400   LABSPEC 1.022 01/17/2013 1400   PHURINE 7.0 01/17/2013 1400   GLUCOSEU NEGATIVE 01/17/2013 1400   HGBUR NEGATIVE 01/17/2013 1400   HGBUR negative 10/17/2009 0942   BILIRUBINUR NEGATIVE 01/17/2013 1400   KETONESUR NEGATIVE 01/17/2013 1400   PROTEINUR NEGATIVE 01/17/2013 1400   UROBILINOGEN 0.2 01/17/2013 1400   NITRITE NEGATIVE 01/17/2013 1400   LEUKOCYTESUR NEGATIVE 01/17/2013 1400    ----------------------------------------------------------------------------------------------------------------  Imaging results:   Ct Head Wo Contrast  01/17/2013   CLINICAL DATA:  Headache.  Garbled speech.  Right hand weakness.  EXAM: CT HEAD WITHOUT CONTRAST  CT CERVICAL SPINE WITHOUT CONTRAST  TECHNIQUE: Multidetector CT imaging of the head and cervical spine was performed following the standard protocol without intravenous contrast. Multiplanar CT image reconstructions of the cervical spine were also generated.  COMPARISON:  10/16/2012  FINDINGS: CT HEAD FINDINGS  Prior left frontoparietal craniotomy. No acute intracranial abnormality. Specifically, no hemorrhage, hydrocephalus, mass lesion, acute infarction, or significant intracranial injury. No acute calvarial abnormality. Visualized paranasal sinuses and mastoids clear. Orbital soft tissues unremarkable.  CT CERVICAL SPINE FINDINGS  Mild  degenerative disc disease at C4-5. Degenerative facet disease, left greater than right. Normal alignment. Prevertebral soft tissues are normal. Mild left neuroforaminal narrowing at C4-5 and C5-6. No fracture. No epidural or paraspinal hematoma. Prevertebral soft tissues are normal.  IMPRESSION: CT HEAD IMPRESSION  No acute intracranial abnormality.  CT CERVICAL SPINE IMPRESSION  Mild degenerative changes. No acute bony abnormality.   Electronically Signed   By: Rolm Baptise M.D.   On: 01/17/2013 14:57   Ct Cervical Spine Wo Contrast  01/17/2013   CLINICAL DATA:  Headache.  Garbled speech.  Right hand weakness.  EXAM: CT HEAD WITHOUT CONTRAST  CT CERVICAL SPINE WITHOUT CONTRAST  TECHNIQUE: Multidetector CT imaging of the head  and cervical spine was performed following the standard protocol without intravenous contrast. Multiplanar CT image reconstructions of the cervical spine were also generated.  COMPARISON:  10/16/2012  FINDINGS: CT HEAD FINDINGS  Prior left frontoparietal craniotomy. No acute intracranial abnormality. Specifically, no hemorrhage, hydrocephalus, mass lesion, acute infarction, or significant intracranial injury. No acute calvarial abnormality. Visualized paranasal sinuses and mastoids clear. Orbital soft tissues unremarkable.  CT CERVICAL SPINE FINDINGS  Mild degenerative disc disease at C4-5. Degenerative facet disease, left greater than right. Normal alignment. Prevertebral soft tissues are normal. Mild left neuroforaminal narrowing at C4-5 and C5-6. No fracture. No epidural or paraspinal hematoma. Prevertebral soft tissues are normal.  IMPRESSION: CT HEAD IMPRESSION  No acute intracranial abnormality.  CT CERVICAL SPINE IMPRESSION  Mild degenerative changes. No acute bony abnormality.   Electronically Signed   By: Rolm Baptise M.D.   On: 01/17/2013 14:57    Baseline EKG has been ordered    Assessment & Plan   1. Right arm/hand heaviness - on exam strength is 5 x 5 in all 4  extremities and sensation is intact, she does not have any focal neurological deficits, however resolving stroke or TIA cannot be ruled out. She has had unremarkable CT scan of the brain. At this time we will admit her to a telemetry bed, stroke protocol will be initiated, will check MRI/MRA of the brain, will check echogram, carotid duplex, A1c, lipid panel, PT OT and speech eval. Neurology has been requested to see the patient. Low-dose aspirin for now. Continue home dose statin.    2. History of dyslipidemia continue home dose statin    3. History of palpitations. No acute issue, continue home dose beta blocker, will check baseline EKG and monitor on telemetry.    5. History of thyroid cancer post partial thyroid resection. Check baseline TSH.    6. History of celiac disease. Will place on gluten free diet if she passes swallow screen.    7. Mentioned history of carotid bruit, not appreciated on exam. Carotid ultrasound ordered.    8. History of subdural hematoma requiring bur hole surgery several years ago. Stable. Head CT scan unremarkable. Outpatient followup with her neurosurgeon Dr.Botero if needed post discharge.    9. 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 Outpatient neurosurgical followup post discharge with Dr.Botero.     DVT Prophylaxis  SCDs    AM Labs Ordered, also please review Full Orders  Family Communication: Admission, patients condition and plan of care including tests being ordered have been discussed with the patient   who indicates understanding and agree with the plan and Code Status.  Code Status full  Likely DC to home  Condition fair  Time spent in minutes : 45    Lala Lund K M.D on 01/17/2013 at 6:04 PM  Between 7am to 7pm - Pager - [phone]  After 7pm go to [email or web address] - password [email or web address]  And look for the night coverage  person covering me after hours  [facility] Office  [phone]

## 2013-01-17 NOTE — ED Notes (Signed)
Patient transported to CT 

## 2013-01-17 NOTE — ED Notes (Signed)
MD at bedside. 

## 2013-01-17 NOTE — ED Notes (Addendum)
Pt had an episode of "garbled speech" and weakness in right hand yesterday.  Pt also had difficulty with fine motor skills in right hand today.  She reports feeling dizzy described as "lightheaded" and a headache described as "like a band around my head".

## 2013-01-17 NOTE — Plan of Care (Signed)
Amanda [name], is a 62 y.o. female, DOB - October 15, 1950, IRW:[id number]  Coming from [facility] for TIA like symptoms, R.arm weakness and headache all resolved, CT OK, CBC and UA OK, lytes pending EKG pending, EDP will call if they are abnormal. Neuro Dr Aram Beecham has been informed, consult formally upon arrival.    Filed Vitals:   01/17/13 1401  BP: 137/79  Pulse: 65  Temp: 98.5 F (36.9 C)  TempSrc: Oral  Resp: 16  Height: 5' 5"  (1.651 m)  Weight: 68.04 kg (150 lb)  SpO2: 99%        Data Review   Micro Results No results found for this or any previous visit (from the past 240 hour(s)).  Radiology Reports Ct Head Wo Contrast  01/17/2013   CLINICAL DATA:  Headache.  Garbled speech.  Right hand weakness.  EXAM: CT HEAD WITHOUT CONTRAST  CT CERVICAL SPINE WITHOUT CONTRAST  TECHNIQUE: Multidetector CT imaging of the head and cervical spine was performed following the standard protocol without intravenous contrast. Multiplanar CT image reconstructions of the cervical spine were also generated.  COMPARISON:  10/16/2012  FINDINGS: CT HEAD FINDINGS  Prior left frontoparietal craniotomy. No acute intracranial abnormality. Specifically, no hemorrhage, hydrocephalus, mass lesion, acute infarction, or significant intracranial injury. No acute calvarial abnormality. Visualized paranasal sinuses and mastoids clear. Orbital soft tissues unremarkable.  CT CERVICAL SPINE FINDINGS  Mild degenerative disc disease at C4-5. Degenerative facet disease, left greater than right. Normal alignment. Prevertebral soft tissues are normal. Mild left neuroforaminal narrowing at C4-5 and C5-6. No fracture. No epidural or paraspinal hematoma. Prevertebral soft tissues are normal.  IMPRESSION: CT HEAD IMPRESSION  No acute intracranial abnormality.  CT CERVICAL SPINE IMPRESSION  Mild degenerative changes. No acute bony abnormality.   Electronically Signed   By: Rolm Baptise M.D.   On: 01/17/2013 14:57   Ct Cervical Spine Wo  Contrast  01/17/2013   CLINICAL DATA:  Headache.  Garbled speech.  Right hand weakness.  EXAM: CT HEAD WITHOUT CONTRAST  CT CERVICAL SPINE WITHOUT CONTRAST  TECHNIQUE: Multidetector CT imaging of the head and cervical spine was performed following the standard protocol without intravenous contrast. Multiplanar CT image reconstructions of the cervical spine were also generated.  COMPARISON:  10/16/2012  FINDINGS: CT HEAD FINDINGS  Prior left frontoparietal craniotomy. No acute intracranial abnormality. Specifically, no hemorrhage, hydrocephalus, mass lesion, acute infarction, or significant intracranial injury. No acute calvarial abnormality. Visualized paranasal sinuses and mastoids clear. Orbital soft tissues unremarkable.  CT CERVICAL SPINE FINDINGS  Mild degenerative disc disease at C4-5. Degenerative facet disease, left greater than right. Normal alignment. Prevertebral soft tissues are normal. Mild left neuroforaminal narrowing at C4-5 and C5-6. No fracture. No epidural or paraspinal hematoma. Prevertebral soft tissues are normal.  IMPRESSION: CT HEAD IMPRESSION  No acute intracranial abnormality.  CT CERVICAL SPINE IMPRESSION  Mild degenerative changes. No acute bony abnormality.   Electronically Signed   By: Rolm Baptise M.D.   On: 01/17/2013 14:57    CBC  Recent Labs Lab 01/17/13 1507  WBC 5.5  HGB 13.7  HCT 41.4  PLT 218  MCV 91.6  MCH 30.3  MCHC 33.1  RDW 12.8  LYMPHSABS 1.6  MONOABS 0.4  EOSABS 0.0  BASOSABS 0.0    Chemistries  No results found for this basename: NA, K, CL, CO2, GLUCOSE, BUN, CREATININE, GFRCGP, CALCIUM, MG, AST, ALT, ALKPHOS, BILITOT,  in the last 168 hours ------------------------------------------------------------------------------------------------------------------ estimated creatinine clearance is 65.6 ml/min (by C-G formula based on  Cr of  0.7). ------------------------------------------------------------------------------------------------------------------ No results found for this basename: HGBA1C,  in the last 72 hours ------------------------------------------------------------------------------------------------------------------ No results found for this basename: CHOL, HDL, LDLCALC, TRIG, CHOLHDL, LDLDIRECT,  in the last 72 hours ------------------------------------------------------------------------------------------------------------------ No results found for this basename: TSH, T4TOTAL, FREET3, T3FREE, THYROIDAB,  in the last 72 hours ------------------------------------------------------------------------------------------------------------------ No results found for this basename: VITAMINB12, FOLATE, FERRITIN, TIBC, IRON, RETICCTPCT,  in the last 72 hours  Coagulation profile No results found for this basename: INR, PROTIME,  in the last 168 hours  No results found for this basename: DDIMER,  in the last 72 hours  Cardiac Enzymes No results found for this basename: CK, CKMB, TROPONINI, MYOGLOBIN,  in the last 168 hours ------------------------------------------------------------------------------------------------------------------ No components found with this basename: POCBNP,

## 2013-01-17 NOTE — ED Notes (Signed)
Yesterday she had a shooting pain in her head and ears. Symptoms resolved without intervention. Later in the day her speech was garbled but resolved. Today her right hand was slow with use. She complains of pressure and shooting pain in her head.

## 2013-01-17 NOTE — ED Notes (Signed)
Room assignment received, [facility] called.

## 2013-01-17 NOTE — Consult Note (Signed)
NEURO HOSPITALIST CONSULT NOTE    Reason for Consult: right hand weakness, transient dysarthria, HA  HPI:                                                                                                                                          Amanda [name] is an 62 y.o. female, right handed, with a past medical history significant for hypercholesterolemia, RLS, celiac disease, OA, thyroid cancer, s/p removal left SDH 2010, transfer to [facility] for further evaluation of the above stated symptoms. She said that she was doing well until around 5 pm yesterday when she developed sudden onset of a severe pain " like a band across the back of my head and neck, then moved to the forehead" and this was simultaneously accompany by a very brief episode of garble speech and heaviness/tthickness of the right hand. She said that by the time she went to bed she was feeling really well but then woke up with head and neck pain this morning as well as a persistent sensation of heaviness/swollen.thick right hand that earlier today affected her ability to write a check. Denies associated vertigo, double vision, difficulty swallowing, face weakness, imbalance, confusion, or visual disturbance. No recent neck or head trauma. Stated that the headache is constant and the right hand feels " weird, thick". CT brain done at [facility] ER was unremarkable for acute abnormality.  Past Medical History  Diagnosis Date  . Celiac disease     Gluten free diet  . Fibroid   . Elevated cholesterol   . Restless leg syndrome   . Myalgia and myositis, unspecified   . Osteoarthrosis, unspecified whether generalized or localized, unspecified site   . Insomnia   . Other abnormal blood chemistry   . Other symptoms involving cardiovascular system   . Osteopenia   . Hiatal hernia   . Reflux   . STD (sexually transmitted disease)     HSV  . GERD (gastroesophageal reflux disease)   . Thyroid cancer   . Change in  voice   . Bruises easily     Past Surgical History  Procedure Laterality Date  . Knee surgery  2002    both knees  . Thyroid surgery      Partial thyroidectomy  . Abdominal hysterectomy  1986    TAH  . Breast surgery  1999    Reduction  . Brain surgery  2010    hemorrhage    Family History  Problem Relation Age of Onset  . Diabetes Maternal Aunt   . Breast cancer Maternal Aunt     Maternal Great aunt-Age 62's  . Heart disease Maternal Grandfather   . Hyperlipidemia Maternal Grandfather   . Hypertension Paternal Grandfather   . Liver cancer Father   .  Colon cancer Neg Hx     Social History:  reports that she has quit smoking. She has never used smokeless tobacco. She reports that she drinks about 0.5 ounces of alcohol per week. She reports that she does not use illicit drugs.  Allergies  Allergen Reactions  . Trazodone And Nefazodone Other (See Comments)  . Ambien [Zolpidem Tartrate]   . Cortisone     REACTION: "mania" post ESI  . Dicloxacillin Other (See Comments)    "stomach"  . Dilantin [Phenytoin Sodium Extended]   . Erythromycin Other (See Comments)    "eats stomach up"  . Levofloxacin     REACTION: SPASMS  . Nitrofuran Derivatives Other (See Comments)  . Zolpidem Tartrate   . Zolpidem Tartrate Other (See Comments)    "bolt lighting in head"  . Phenytoin Sodium Extended Rash    Dilantin    MEDICATIONS:                                                                                                                     I have reviewed the patient's current medications.   ROS:                                                                                                                                       History obtained from the patient and chart review.  General ROS: negative for - chills, fever, night sweats, weight gain or weight loss Psychological ROS: negative for - behavioral disorder, hallucinations, memory difficulties, mood swings or suicidal  ideation Ophthalmic ROS: negative for - blurry vision, double vision, eye pain or loss of vision ENT ROS: negative for - epistaxis, nasal discharge, oral lesions, sore throat, tinnitus or vertigo Allergy and Immunology ROS: negative for - hives or itchy/watery eyes Hematological and Lymphatic ROS: negative for - bleeding problems, bruising or swollen lymph nodes Endocrine ROS: negative for - galactorrhea, hair pattern changes, polydipsia/polyuria or temperature intolerance Respiratory ROS: negative for - cough, hemoptysis, shortness of breath or wheezing Cardiovascular ROS: negative for - chest pain, dyspnea on exertion, edema or irregular heartbeat Gastrointestinal ROS: negative for - abdominal pain, diarrhea, hematemesis, nausea/vomiting or stool incontinence Genito-Urinary ROS: negative for - dysuria, hematuria, incontinence or urinary frequency/urgency Musculoskeletal ROS: negative for - joint swelling Neurological ROS: as noted in HPI Dermatological ROS: negative for rash and skin lesion changes     Physical exam: pleasant female in  no apparent distress. Head: normocephalic. Neck: supple, no bruits, no JVD. Cardiac: no murmurs. Lungs: clear. Abdomen: soft, no tender, no mass. Extremities: no edema.  Blood pressure 137/79, pulse 64, temperature 98.5 F (36.9 C), temperature source Oral, resp. rate 16, height 5' 5"  (1.651 m), weight 68.04 kg (150 lb), SpO2 100.00%.   Neurologic Examination:                                                                                                      Mental Status: Alert, oriented, thought content appropriate.  Speech fluent without evidence of aphasia.  Able to follow 3 step commands without difficulty. Cranial Nerves: II: Discs flat bilaterally; Visual fields grossly normal, pupils equal, round, reactive to light and accommodation III,IV, VI: ptosis not present, extra-ocular motions intact bilaterally V,VII: smile symmetric, facial light  touch sensation normal bilaterally VIII: hearing normal bilaterally IX,X: gag reflex present XI: bilateral shoulder shrug XII: midline tongue extension Motor: Mild right hand weakness?/?? Pronator drift in the right Tone and bulk:normal tone throughout; no atrophy noted Sensory: Pinprick and light touch intact throughout, bilaterally Deep Tendon Reflexes:  1 all over Plantars: Right: downgoing   Left: downgoing Cerebellar: normal finger-to-nose,  normal heel-to-shin test Gait: No ataxia. CV: pulses palpable throughout    Lab Results  Component Value Date/Time   CHOL 197 09/23/2008 10:06 AM    Results for orders placed during the hospital encounter of 01/17/13 (from the past 48 hour(s))  URINALYSIS, ROUTINE W REFLEX MICROSCOPIC     Status: Abnormal   Collection Time    01/17/13  2:00 PM      Result Value Range   Color, Urine YELLOW  YELLOW   APPearance CLOUDY (*) CLEAR   Specific Gravity, Urine 1.022  1.005 - 1.030   pH 7.0  5.0 - 8.0   Glucose, UA NEGATIVE  NEGATIVE mg/dL   Hgb urine dipstick NEGATIVE  NEGATIVE   Bilirubin Urine NEGATIVE  NEGATIVE   Ketones, ur NEGATIVE  NEGATIVE mg/dL   Protein, ur NEGATIVE  NEGATIVE mg/dL   Urobilinogen, UA 0.2  0.0 - 1.0 mg/dL   Nitrite NEGATIVE  NEGATIVE   Leukocytes, UA NEGATIVE  NEGATIVE   Comment: MICROSCOPIC NOT DONE ON URINES WITH NEGATIVE PROTEIN, BLOOD, LEUKOCYTES, NITRITE, OR GLUCOSE <1000 mg/dL.  CBC WITH DIFFERENTIAL     Status: None   Collection Time    01/17/13  3:07 PM      Result Value Range   WBC 5.5  4.0 - 10.5 K/uL   RBC 4.52  3.87 - 5.11 MIL/uL   Hemoglobin 13.7  12.0 - 15.0 g/dL   HCT 41.4  36.0 - 46.0 %   MCV 91.6  78.0 - 100.0 fL   MCH 30.3  26.0 - 34.0 pg   MCHC 33.1  30.0 - 36.0 g/dL   RDW 12.8  11.5 - 15.5 %   Platelets 218  150 - 400 K/uL   Neutrophils Relative % 64  43 - 77 %   Neutro Abs 3.5  1.7 - 7.7 K/uL   Lymphocytes Relative  29  12 - 46 %   Lymphs Abs 1.6  0.7 - 4.0 K/uL   Monocytes  Relative 7  3 - 12 %   Monocytes Absolute 0.4  0.1 - 1.0 K/uL   Eosinophils Relative 1  0 - 5 %   Eosinophils Absolute 0.0  0.0 - 0.7 K/uL   Basophils Relative 0  0 - 1 %   Basophils Absolute 0.0  0.0 - 0.1 K/uL  COMPREHENSIVE METABOLIC PANEL     Status: Abnormal   Collection Time    01/17/13  3:07 PM      Result Value Range   Sodium 141  135 - 145 mEq/L   Potassium 3.6  3.5 - 5.1 mEq/L   Chloride 104  96 - 112 mEq/L   CO2 28  19 - 32 mEq/L   Glucose, Bld 94  70 - 99 mg/dL   BUN 19  6 - 23 mg/dL   Creatinine, Ser 0.90  0.50 - 1.10 mg/dL   Calcium 10.4  8.4 - 10.5 mg/dL   Total Protein 7.8  6.0 - 8.3 g/dL   Albumin 4.5  3.5 - 5.2 g/dL   AST 25  0 - 37 U/L   ALT 26  0 - 35 U/L   Alkaline Phosphatase 84  39 - 117 U/L   Total Bilirubin 0.5  0.3 - 1.2 mg/dL   GFR calc non Af Amer 67 (*) >90 mL/min   GFR calc Af Amer 78 (*) >90 mL/min   Comment: (NOTE)     The eGFR has been calculated using the CKD EPI equation.     This calculation has not been validated in all clinical situations.     eGFR's persistently <90 mL/min signify possible Chronic Kidney     Disease.    Ct Head Wo Contrast  01/17/2013   CLINICAL DATA:  Headache.  Garbled speech.  Right hand weakness.  EXAM: CT HEAD WITHOUT CONTRAST  CT CERVICAL SPINE WITHOUT CONTRAST  TECHNIQUE: Multidetector CT imaging of the head and cervical spine was performed following the standard protocol without intravenous contrast. Multiplanar CT image reconstructions of the cervical spine were also generated.  COMPARISON:  10/16/2012  FINDINGS: CT HEAD FINDINGS  Prior left frontoparietal craniotomy. No acute intracranial abnormality. Specifically, no hemorrhage, hydrocephalus, mass lesion, acute infarction, or significant intracranial injury. No acute calvarial abnormality. Visualized paranasal sinuses and mastoids clear. Orbital soft tissues unremarkable.  CT CERVICAL SPINE FINDINGS  Mild degenerative disc disease at C4-5. Degenerative facet  disease, left greater than right. Normal alignment. Prevertebral soft tissues are normal. Mild left neuroforaminal narrowing at C4-5 and C5-6. No fracture. No epidural or paraspinal hematoma. Prevertebral soft tissues are normal.  IMPRESSION: CT HEAD IMPRESSION  No acute intracranial abnormality.  CT CERVICAL SPINE IMPRESSION  Mild degenerative changes. No acute bony abnormality.   Electronically Signed   By: Rolm Baptise M.D.   On: 01/17/2013 14:57   Ct Cervical Spine Wo Contrast  01/17/2013   CLINICAL DATA:  Headache.  Garbled speech.  Right hand weakness.  EXAM: CT HEAD WITHOUT CONTRAST  CT CERVICAL SPINE WITHOUT CONTRAST  TECHNIQUE: Multidetector CT imaging of the head and cervical spine was performed following the standard protocol without intravenous contrast. Multiplanar CT image reconstructions of the cervical spine were also generated.  COMPARISON:  10/16/2012  FINDINGS: CT HEAD FINDINGS  Prior left frontoparietal craniotomy. No acute intracranial abnormality. Specifically, no hemorrhage, hydrocephalus, mass lesion, acute infarction, or significant intracranial injury. No acute calvarial  abnormality. Visualized paranasal sinuses and mastoids clear. Orbital soft tissues unremarkable.  CT CERVICAL SPINE FINDINGS  Mild degenerative disc disease at C4-5. Degenerative facet disease, left greater than right. Normal alignment. Prevertebral soft tissues are normal. Mild left neuroforaminal narrowing at C4-5 and C5-6. No fracture. No epidural or paraspinal hematoma. Prevertebral soft tissues are normal.  IMPRESSION: CT HEAD IMPRESSION  No acute intracranial abnormality.  CT CERVICAL SPINE IMPRESSION  Mild degenerative changes. No acute bony abnormality.   Electronically Signed   By: Rolm Baptise M.D.   On: 01/17/2013 14:57     Assessment/Plan: 61 Y/O with new onset HA-neck pain, transient dysarthria, and persistent sensation of heavy/thick right hand. Mild right hand weakness/pronator drift?. Will entertain  the possibility of an acute vascular event involving subcortical region left brain. Recommend: stroke work up. Aspirin 81 mg. Will follow up.  Dorian Pod, MD [facility] [phone]  01/17/2013, 6:24 PM

## 2013-01-17 NOTE — ED Provider Notes (Addendum)
CSN: [id number]     Arrival date & time 01/17/13  1354 History   First MD Initiated Contact with Patient 01/17/13 1427     Chief Complaint  Patient presents with  . Dizziness   (Consider location/radiation/quality/duration/timing/severity/associated sxs/prior Treatment) The history is provided by the patient.  Amanda [name] is a 62 y.o. female history celiac disease, arthritis here presenting with headache and slurred speech and right arm pain. Start with occipital headache yesterday around 5 PM. There has some right arm weakness and had trouble picking up things with her right arm. Later on yesterday the husband noticed that he had some slurred speech but that subsequently resolved. Today she was trying to write a check but has trouble writing. No history of strokes but has subdural hemorrhage previously. Denies dizziness or passing out.    Past Medical History  Diagnosis Date  . Celiac disease     Gluten free diet  . Fibroid   . Elevated cholesterol   . Restless leg syndrome   . Myalgia and myositis, unspecified   . Osteoarthrosis, unspecified whether generalized or localized, unspecified site   . Insomnia   . Other abnormal blood chemistry   . Other symptoms involving cardiovascular system   . Osteopenia   . Hiatal hernia   . Reflux   . STD (sexually transmitted disease)     HSV  . GERD (gastroesophageal reflux disease)   . Thyroid cancer   . Change in voice   . Bruises easily    Past Surgical History  Procedure Laterality Date  . Knee surgery  2002    both knees  . Thyroid surgery      Partial thyroidectomy  . Abdominal hysterectomy  1986    TAH  . Breast surgery  1999    Reduction  . Brain surgery  2010    hemorrhage   Family History  Problem Relation Age of Onset  . Diabetes Maternal Aunt   . Breast cancer Maternal Aunt     Maternal Great aunt-Age 68's  . Heart disease Maternal Grandfather   . Hyperlipidemia Maternal Grandfather   . Hypertension  Paternal Grandfather   . Liver cancer Father   . Colon cancer Neg Hx    History  Substance Use Topics  . Smoking status: Former Research scientist (life sciences)  . Smokeless tobacco: Never Used  . Alcohol Use: 0.5 oz/week    1 drink(s) per week   OB History   Grav Para Term Preterm Abortions TAB SAB Ect Mult Living   2 2 2       2      Review of Systems  Neurological: Positive for speech difficulty, weakness and headaches.  All other systems reviewed and are negative.     Allergies  Trazodone and nefazodone; Ambien; Cortisone; Dicloxacillin; Dilantin; Erythromycin; Levofloxacin; Nitrofuran derivatives; Zolpidem tartrate; Zolpidem tartrate; and Phenytoin sodium extended  Home Medications   Current Outpatient Rx  Name  Route  Sig  Dispense  Refill  . atorvastatin (LIPITOR) 40 MG tablet   Oral   Take 20 mg by mouth daily.          . Calcium Carbonate-Vitamin D (CALCIUM + D PO)   Oral   Take by mouth 2 (two) times daily.           . Cholecalciferol (VITAMIN D PO)   Oral   Take 1,000 Units by mouth.           . clonazePAM (KLONOPIN) 0.5 MG tablet  Oral   Take 0.5 mg by mouth at bedtime as needed.           . fluconazole (DIFLUCAN) 150 MG tablet   Oral   Take 1 tablet (150 mg total) by mouth once.   1 tablet   0   . METOPROLOL SUCCINATE ER PO   Oral   Take by mouth.         . moxifloxacin (AVELOX) 400 MG tablet   Oral   Take 1 tablet (400 mg total) by mouth daily.   7 tablet   0   . Multiple Vitamin (MULTIVITAMIN) tablet   Oral   Take 1 tablet by mouth daily.           [name] PARoxetine (PAXIL) 10 MG tablet      Takes 1/2 tablet by mouth once daily         . RESTASIS 0.05 % ophthalmic emulsion   Both Eyes   Place 1 drop into both eyes daily.         [name] ROPINIROLE HCL PO   Oral   Take by mouth Nightly.          . valACYclovir (VALTREX) 500 MG tablet      TAKE 1 TABLET EVERY DAY   90 tablet   3    BP 137/79  Pulse 65  Temp(Src) 98.5 F (36.9 C) (Oral)   Resp 16  Ht 5' 5"  (1.651 m)  Wt 150 lb (68.04 kg)  BMI 24.96 kg/m2  SpO2 99% Physical Exam  Nursing note and vitals reviewed. Constitutional: She is oriented to person, place, and time. She appears well-developed and well-nourished.  HENT:  Head: Normocephalic.  Mouth/Throat: Oropharynx is clear and moist.  Eyes: Conjunctivae are normal. Pupils are equal, round, and reactive to light.  Neck: Normal range of motion. Neck supple.  Cardiovascular: Normal rate, regular rhythm and normal heart sounds.   Pulmonary/Chest: Effort normal and breath sounds normal. No respiratory distress. She has no wheezes. She has no rales.  Abdominal: Soft. Bowel sounds are normal. She exhibits no distension. There is no tenderness. There is no rebound and no guarding.  Musculoskeletal: Normal range of motion.  Neurological: She is alert and oriented to person, place, and time.  CN 2-12 intact. Strength 4/5 R Upper extremity, 5/5 otherwise. Nl sensation throughout   Skin: Skin is warm and dry.  Psychiatric: She has a normal mood and affect. Her behavior is normal. Judgment and thought content normal.    ED Course  Procedures (including critical care time) Labs Review Labs Reviewed  URINALYSIS, ROUTINE W REFLEX MICROSCOPIC - Abnormal; Notable for the following:    APPearance CLOUDY (*)    All other components within normal limits  CBC WITH DIFFERENTIAL  COMPREHENSIVE METABOLIC PANEL   Imaging Review Ct Head Wo Contrast  01/17/2013   CLINICAL DATA:  Headache.  Garbled speech.  Right hand weakness.  EXAM: CT HEAD WITHOUT CONTRAST  CT CERVICAL SPINE WITHOUT CONTRAST  TECHNIQUE: Multidetector CT imaging of the head and cervical spine was performed following the standard protocol without intravenous contrast. Multiplanar CT image reconstructions of the cervical spine were also generated.  COMPARISON:  10/16/2012  FINDINGS: CT HEAD FINDINGS  Prior left frontoparietal craniotomy. No acute intracranial  abnormality. Specifically, no hemorrhage, hydrocephalus, mass lesion, acute infarction, or significant intracranial injury. No acute calvarial abnormality. Visualized paranasal sinuses and mastoids clear. Orbital soft tissues unremarkable.  CT CERVICAL SPINE FINDINGS  Mild degenerative disc disease at  C4-5. Degenerative facet disease, left greater than right. Normal alignment. Prevertebral soft tissues are normal. Mild left neuroforaminal narrowing at C4-5 and C5-6. No fracture. No epidural or paraspinal hematoma. Prevertebral soft tissues are normal.  IMPRESSION: CT HEAD IMPRESSION  No acute intracranial abnormality.  CT CERVICAL SPINE IMPRESSION  Mild degenerative changes. No acute bony abnormality.   Electronically Signed   By: Rolm Baptise M.D.   On: 01/17/2013 14:57   Ct Cervical Spine Wo Contrast  01/17/2013   CLINICAL DATA:  Headache.  Garbled speech.  Right hand weakness.  EXAM: CT HEAD WITHOUT CONTRAST  CT CERVICAL SPINE WITHOUT CONTRAST  TECHNIQUE: Multidetector CT imaging of the head and cervical spine was performed following the standard protocol without intravenous contrast. Multiplanar CT image reconstructions of the cervical spine were also generated.  COMPARISON:  10/16/2012  FINDINGS: CT HEAD FINDINGS  Prior left frontoparietal craniotomy. No acute intracranial abnormality. Specifically, no hemorrhage, hydrocephalus, mass lesion, acute infarction, or significant intracranial injury. No acute calvarial abnormality. Visualized paranasal sinuses and mastoids clear. Orbital soft tissues unremarkable.  CT CERVICAL SPINE FINDINGS  Mild degenerative disc disease at C4-5. Degenerative facet disease, left greater than right. Normal alignment. Prevertebral soft tissues are normal. Mild left neuroforaminal narrowing at C4-5 and C5-6. No fracture. No epidural or paraspinal hematoma. Prevertebral soft tissues are normal.  IMPRESSION: CT HEAD IMPRESSION  No acute intracranial abnormality.  CT CERVICAL SPINE  IMPRESSION  Mild degenerative changes. No acute bony abnormality.   Electronically Signed   By: Rolm Baptise M.D.   On: 01/17/2013 14:57    Date: 01/17/2013  Rate: 63  Rhythm: normal sinus rhythm  QRS Axis: normal  Intervals: normal  ST/T Wave abnormalities: nonspecific ST changes  Conduction Disutrbances:none  Narrative Interpretation:   Old EKG Reviewed: unchanged    MDM  No diagnosis found. DORIA FERN is a 62 y.o. female here with possible TIA with residual R arm weakness. Will do CT head/neck (has neck problems in the past). Will likely need admission.   3:24 PM CT head/neck unremarkable. I called Dr. Aram Beecham from neurology, who recommend admission for TIA and he will see patient on the floor. I called hospitalist, who will admit.       Wandra Arthurs, MD 01/17/13 [place] Sheri Gatchel, MD 01/17/13 [phone]

## 2013-01-17 NOTE — ED Notes (Signed)
Report called to unit RN 4N

## 2013-01-18 DIAGNOSIS — I10 Essential (primary) hypertension: Secondary | ICD-10-CM

## 2013-01-18 DIAGNOSIS — M5412 Radiculopathy, cervical region: Secondary | ICD-10-CM

## 2013-01-18 DIAGNOSIS — I519 Heart disease, unspecified: Secondary | ICD-10-CM

## 2013-01-18 LAB — HEMOGLOBIN A1C: Hgb A1c MFr Bld: 5.7 % — ABNORMAL HIGH (ref <5.7)

## 2013-01-18 LAB — CBC
HCT: 36.8 % (ref 36.0–46.0)
Hemoglobin: 12.1 g/dL (ref 12.0–15.0)
MCH: 29.8 pg (ref 26.0–34.0)
MCHC: 32.9 g/dL (ref 30.0–36.0)
RDW: 13 % (ref 11.5–15.5)

## 2013-01-18 LAB — BASIC METABOLIC PANEL
BUN: 20 mg/dL (ref 6–23)
Chloride: 105 mEq/L (ref 96–112)
Creatinine, Ser: 0.79 mg/dL (ref 0.50–1.10)
GFR calc Af Amer: >90 mL/min (ref >90)
Glucose, Bld: 87 mg/dL (ref 70–99)
Potassium: 4 mEq/L (ref 3.5–5.1)

## 2013-01-18 LAB — LIPID PANEL
Cholesterol: 170 mg/dL (ref 0–200)
HDL: 68 mg/dL (ref >39)
LDL Cholesterol: 86 mg/dL (ref 0–99)
Total CHOL/HDL Ratio: 2.5 RATIO
Triglycerides: 82 mg/dL (ref <150)
VLDL: 16 mg/dL (ref 0–40)

## 2013-01-18 MED ORDER — ASPIRIN 81 MG PO TBEC: 81.0000 mg | DELAYED_RELEASE_TABLET | Freq: Every day | ORAL | Status: DC

## 2013-01-18 NOTE — Progress Notes (Signed)
SLP Cancellation Note  Patient Details Name: Amanda [name] MRN: [id number] DOB: March 28, 1951   Cancelled treatment:       Reason Eval/Treat Not Completed: Other (comment). Reviewed chart, per all reports pt back to baseline, no further concerns,stroke education completed by PT/OT. SLP will defer eval at this time. If further needs arise, please reorder.    Juno Bozard, Katherene Ponto 01/18/2013, 2:53 PM

## 2013-01-18 NOTE — Progress Notes (Signed)
[facility] PROGRESS NOTE  Amanda [name] KDT:[id number] DOB: 1950/07/21 DOA: 01/17/2013 PCP: [name], MD  Assessment/Plan: 62 y.o. female, subdural hematoma requiring bur hole several years ago after a fall and concussion, dyslipidemia, history of palpitations on better blocker, thyroid surgery status post partial thyroid resection, celiac disease, presents to the hospital after she started experiencing sharp pin-like sensation headaches in her occipital area yesterday evening, this was accompanied by slurred speech which lasted a few seconds and then right arm and hand heaviness  1. ? TIA; symptoms resolved; CT head:nno acute findings, Prior left frontoparietal craniotomy;  -MRI: Old left frontal craniotomy for treatment of a subdural hematoma by history -pend echo; cont ASA, statin, BP control;  2. HPL cont statin  3. History of palpitations. No acute issue, continue home dose beta blocker 4. History of thyroid cancer post partial thyroid resection.  TSH-1.5     Code Status: full  Family Communication: not at the bedside  Disposition Plan: home likely today    Consultants:  Neurology   Procedures:  CT, MRI heard   Antibiotics: No  HPI/Subjective: Alert, awawke;   Objective: Filed Vitals:   01/18/13 0500  BP: 105/50  Pulse: 51  Temp: 98.2 F (36.8 C)  Resp: 19    Intake/Output Summary (Last 24 hours) at 01/18/13 1002 Last data filed at 01/18/13 0700  Gross per 24 hour  Intake   1360 ml  Output      0 ml  Net   1360 ml   Filed Weights   01/17/13 1401  Weight: 68.04 kg (150 lb)    Exam:   General:  Alert, no distress   Cardiovascular: S1, S2, RRR  Respiratory: CTA BL   Abdomen: soft, NT, ND   Musculoskeletal: no LE edema    Data Reviewed: Basic Metabolic Panel:  Recent Labs Lab 01/17/13 1507 01/18/13 0525  NA 141 141  K 3.6 4.0  CL 104 105  CO2 28 25  GLUCOSE 94 87  BUN 19 20  CREATININE 0.90 0.79  CALCIUM 10.4 9.3    Liver Function Tests:  Recent Labs Lab 01/17/13 1507  AST 25  ALT 26  ALKPHOS 84  BILITOT 0.5  PROT 7.8  ALBUMIN 4.5   No results found for this basename: LIPASE, AMYLASE,  in the last 168 hours No results found for this basename: AMMONIA,  in the last 168 hours CBC:  Recent Labs Lab 01/17/13 1507 01/18/13 0525  WBC 5.5 5.9  NEUTROABS 3.5  --   HGB 13.7 12.1  HCT 41.4 36.8  MCV 91.6 90.6  PLT 218 211   Cardiac Enzymes: No results found for this basename: CKTOTAL, CKMB, CKMBINDEX, TROPONINI,  in the last 168 hours BNP (last 3 results) No results found for this basename: PROBNP,  in the last 8760 hours CBG: No results found for this basename: GLUCAP,  in the last 168 hours  No results found for this or any previous visit (from the past 240 hour(s)).   Studies: Dg Chest 2 View  01/17/2013   *RADIOLOGY REPORT*  Clinical Data: Headache and slurred speech  CHEST - 2 VIEW  Comparison: 06/04/2010  Findings: Heart size is normal.  No pleural effusion or edema identified.  No airspace consolidation identified.  Spondylosis noted within the thoracic spine.  IMPRESSION:  1.  No acute cardiopulmonary abnormalities.   Original Report Authenticated By: Kerby Moors, M.D.   Ct Head Wo Contrast  01/17/2013   CLINICAL DATA:  Headache.  Garbled  speech.  Right hand weakness.  EXAM: CT HEAD WITHOUT CONTRAST  CT CERVICAL SPINE WITHOUT CONTRAST  TECHNIQUE: Multidetector CT imaging of the head and cervical spine was performed following the standard protocol without intravenous contrast. Multiplanar CT image reconstructions of the cervical spine were also generated.  COMPARISON:  10/16/2012  FINDINGS: CT HEAD FINDINGS  Prior left frontoparietal craniotomy. No acute intracranial abnormality. Specifically, no hemorrhage, hydrocephalus, mass lesion, acute infarction, or significant intracranial injury. No acute calvarial abnormality. Visualized paranasal sinuses and mastoids clear. Orbital soft  tissues unremarkable.  CT CERVICAL SPINE FINDINGS  Mild degenerative disc disease at C4-5. Degenerative facet disease, left greater than right. Normal alignment. Prevertebral soft tissues are normal. Mild left neuroforaminal narrowing at C4-5 and C5-6. No fracture. No epidural or paraspinal hematoma. Prevertebral soft tissues are normal.  IMPRESSION: CT HEAD IMPRESSION  No acute intracranial abnormality.  CT CERVICAL SPINE IMPRESSION  Mild degenerative changes. No acute bony abnormality.   Electronically Signed   By: Rolm Baptise M.D.   On: 01/17/2013 14:57   Ct Cervical Spine Wo Contrast  01/17/2013   CLINICAL DATA:  Headache.  Garbled speech.  Right hand weakness.  EXAM: CT HEAD WITHOUT CONTRAST  CT CERVICAL SPINE WITHOUT CONTRAST  TECHNIQUE: Multidetector CT imaging of the head and cervical spine was performed following the standard protocol without intravenous contrast. Multiplanar CT image reconstructions of the cervical spine were also generated.  COMPARISON:  10/16/2012  FINDINGS: CT HEAD FINDINGS  Prior left frontoparietal craniotomy. No acute intracranial abnormality. Specifically, no hemorrhage, hydrocephalus, mass lesion, acute infarction, or significant intracranial injury. No acute calvarial abnormality. Visualized paranasal sinuses and mastoids clear. Orbital soft tissues unremarkable.  CT CERVICAL SPINE FINDINGS  Mild degenerative disc disease at C4-5. Degenerative facet disease, left greater than right. Normal alignment. Prevertebral soft tissues are normal. Mild left neuroforaminal narrowing at C4-5 and C5-6. No fracture. No epidural or paraspinal hematoma. Prevertebral soft tissues are normal.  IMPRESSION: CT HEAD IMPRESSION  No acute intracranial abnormality.  CT CERVICAL SPINE IMPRESSION  Mild degenerative changes. No acute bony abnormality.   Electronically Signed   By: Rolm Baptise M.D.   On: 01/17/2013 14:57   Mr Brain Wo Contrast  01/18/2013   *RADIOLOGY REPORT*  Clinical Data:  Stroke.   Dizziness.  Slurred speech.  Right arm and hand heaviness.  History of subdural hematoma.  MRI HEAD WITHOUT CONTRAST MRA HEAD WITHOUT CONTRAST  Technique:  Multiplanar, multiecho pulse sequences of the brain and surrounding structures were obtained without intravenous contrast. Angiographic images of the head were obtained using MRA technique without contrast.  Comparison:  Head CT 01/17/2013.  MRI 08/16/2012.  MRI HEAD  Findings:  There has been previous left frontal craniotomy for treatment of subdural hematoma by history.  Otherwise, the examination is normal.  There is no evidence of old or acute infarction, mass lesion, hemorrhage, hydrocephalus or extra-axial collection.  No pituitary mass.  No fluid in the sinuses, middle ears or mastoids.  IMPRESSION: Old left frontal craniotomy for treatment of a subdural hematoma by history.  Other than that, the examination is normal.  No sequela identified.  No old or acute ischemic changes.  MRA HEAD  Findings: Both internal carotid arteries are widely patent into the brain.  The anterior and middle cerebral vessels are normal without proximal stenosis, aneurysm or vascular malformation.  Both vertebral arteries are patent to the basilar.  No basilar stenosis. Posterior circulation branch vessels are normal.  IMPRESSION: Normal intracranial MR angiography  of the large and medium-sized vessels.   Original Report Authenticated By: Nelson Chimes, M.D.   Mr Mra Head/brain Wo Cm  01/18/2013   *RADIOLOGY REPORT*  Clinical Data:  Stroke.  Dizziness.  Slurred speech.  Right arm and hand heaviness.  History of subdural hematoma.  MRI HEAD WITHOUT CONTRAST MRA HEAD WITHOUT CONTRAST  Technique:  Multiplanar, multiecho pulse sequences of the brain and surrounding structures were obtained without intravenous contrast. Angiographic images of the head were obtained using MRA technique without contrast.  Comparison:  Head CT 01/17/2013.  MRI 08/16/2012.  MRI HEAD  Findings:  There  has been previous left frontal craniotomy for treatment of subdural hematoma by history.  Otherwise, the examination is normal.  There is no evidence of old or acute infarction, mass lesion, hemorrhage, hydrocephalus or extra-axial collection.  No pituitary mass.  No fluid in the sinuses, middle ears or mastoids.  IMPRESSION: Old left frontal craniotomy for treatment of a subdural hematoma by history.  Other than that, the examination is normal.  No sequela identified.  No old or acute ischemic changes.  MRA HEAD  Findings: Both internal carotid arteries are widely patent into the brain.  The anterior and middle cerebral vessels are normal without proximal stenosis, aneurysm or vascular malformation.  Both vertebral arteries are patent to the basilar.  No basilar stenosis. Posterior circulation branch vessels are normal.  IMPRESSION: Normal intracranial MR angiography of the large and medium-sized vessels.   Original Report Authenticated By: Nelson Chimes, M.D.    Scheduled Meds: . sodium chloride   Intravenous STAT  . aspirin EC  81 mg Oral Daily  . atorvastatin  20 mg Oral Daily  . calcium-vitamin D  1 tablet Oral BID  . clonazePAM  0.5 mg Oral BID  . cycloSPORINE  1 drop Both Eyes Daily  . metoprolol tartrate  25 mg Oral BID  . PARoxetine  10 mg Oral Daily  . rOPINIRole  0.25 mg Oral QHS  . sodium chloride  3 mL Intravenous Q12H   Continuous Infusions:   Principal Problem:   Right arm weakness Active Problems:   HYPERLIPIDEMIA   SUBDURAL HEMATOMA   ACID REFLUX DISEASE   DUODENITIS   Celiac disease   CAROTID BRUIT, LEFT   HYPERGLYCEMIA   THYROID CANCER, HX OF   GERD (gastroesophageal reflux disease)   Restless leg syndrome   Headache(784.0)   Palpitations    Time spent: > 25 minutes     Kinnie Feil  [facility] Pager [phone]. If 7PM-7AM, please contact night-coverage at [email or web address], password [facility] 01/18/2013, 10:02 AM  LOS: 1 day

## 2013-01-18 NOTE — Progress Notes (Addendum)
*  PRELIMINARY RESULTS* Vascular Ultrasound Carotid Duplex (Doppler) has been completed.  Preliminary findings: Bilateral:  1-39% ICA stenosis.  Vertebral artery flow is antegrade.    Incidental finding= right thyroid nodule.   Landry Mellow, RDMS, RVT  01/18/2013, 12:52 PM

## 2013-01-18 NOTE — Progress Notes (Signed)
Pt verbalized refusal of medication in the am. She said that she always took them in the evening. Pharmacy notified to switch to hs. Blanchard Kelch, RN. 01/18/2013

## 2013-01-18 NOTE — Progress Notes (Signed)
  Echocardiogram 2D Echocardiogram has been performed.  Amanda [name] 01/18/2013, 12:45 PM

## 2013-01-18 NOTE — Evaluation (Signed)
Physical Therapy Evaluation Patient Details Name: Amanda [name] MRN: [id number] DOB: 1951-02-22 Today's Date: 01/18/2013 Time: 0752-0827 PT Time Calculation (min): 35 min  PT Assessment / Plan / Recommendation History of Present Illness  pt rpesents with Dysarthria and R hand weakness.    Clinical Impression  Pt seems to be near baseline level of function.  Provided stroke education handout and lengthy discussion about stroke education.  No further PT needs at this time, will sign off.      PT Assessment  Patent does not need any further PT services    Follow Up Recommendations  No PT follow up    Does the patient have the potential to tolerate intense rehabilitation      Barriers to Discharge        Equipment Recommendations  None recommended by PT    Recommendations for Other Services     Frequency      Precautions / Restrictions Precautions Precautions: None Restrictions Weight Bearing Restrictions: No   Pertinent Vitals/Pain Indicates headache only.        Mobility  Bed Mobility Bed Mobility: Supine to Sit;Sitting - Scoot to Edge of Bed;Sit to Supine Supine to Sit: 7: Independent Sitting - Scoot to Edge of Bed: 7: Independent Sit to Supine: 7: Independent Transfers Transfers: Sit to Stand;Stand to Sit Sit to Stand: 7: Independent Stand to Sit: 7: Independent Ambulation/Gait Ambulation/Gait Assistance: 7: Independent Ambulation Distance (Feet): 200 Feet Assistive device: None Gait Pattern: Within Functional Limits Stairs: Yes Stairs Assistance: 6: Modified independent (Device/Increase time) Stair Management Technique: One rail Left;Forwards Number of Stairs: 11 Wheelchair Mobility Wheelchair Mobility: No Modified Rankin (Stroke Patients Only) Pre-Morbid Rankin Score: No symptoms Modified Rankin: No significant disability    Exercises     PT Diagnosis:    PT Problem List:   PT Treatment Interventions:       PT Goals(Current goals can be  found in the care plan section)    Visit Information  Last PT Received On: 01/18/13 Assistance Needed: +1 PT/OT Co-Evaluation/Treatment: Yes History of Present Illness: pt rpesents with Dysarthria and R hand weakness.         Prior [place] expects to be discharged to:: Private residence Living Arrangements: Spouse/significant other Available Help at Discharge: Family;Available 24 hours/day Type of Home: House Home Access: Stairs to enter [employer] of Steps: 6 Entrance Stairs-Rails: Left Home Layout: Multi-level Alternate Level Stairs-Number of Steps: 12 Alternate Level Stairs-Rails: Left Home Equipment: None Prior Function Level of Independence: Independent Communication Communication: No difficulties Dominant Hand: Right    Cognition  Cognition Arousal/Alertness: Awake/alert Behavior During Therapy: WFL for tasks assessed/performed Overall Cognitive Status: Within Functional Limits for tasks assessed    Extremity/Trunk Assessment Upper Extremity Assessment Upper Extremity Assessment: Defer to OT evaluation Lower Extremity Assessment Lower Extremity Assessment: Overall WFL for tasks assessed Cervical / Trunk Assessment Cervical / Trunk Assessment: Normal   Balance Balance Balance Assessed: Yes High Level Balance High Level Balance Activites: Head turns;Turns;Direction changes  End of Session PT - End of Session Activity Tolerance: Patient tolerated treatment well Patient left: in bed;with call bell/phone within reach Nurse Communication: Mobility status  GP     Catarina Hartshorn, [place] 01/18/2013, 8:29 AM

## 2013-01-18 NOTE — Evaluation (Signed)
Occupational Therapy Evaluation Patient Details Name: Amanda [name] MRN: [id number] DOB: [birth year] Today's Date: 01/18/2013 Time: 4765-4650 OT Time Calculation (min): 29 min  OT Assessment / Plan / Recommendation History of present illness 62 yo female presents with dysarthria and Rt hand weakness   Clinical Impression   Patient evaluated by Occupational Therapy with no further acute OT needs identified. All education has been completed and the patient has no further questions. See below for any follow-up Occupational Therapy or equipment needs. OT to sign off. Thank you for referral.      OT Assessment  Patient does not need any further OT services    Follow Up Recommendations  No OT follow up    Barriers to Discharge      Equipment Recommendations  None recommended by OT    Recommendations for Other Services    Frequency       Precautions / Restrictions Precautions Precautions: None Restrictions Weight Bearing Restrictions: No   Pertinent Vitals/Pain Shooting pain in posterior occipital area while supine    ADL  Eating/Feeding: Independent Where Assessed - Eating/Feeding: Bed level Grooming: Wash/dry face;Independent Where Assessed - Grooming: Supported sitting ADL Comments: Session focused on stroke education and answering questions about deficits patient experienced that lead to current admission. Pt verbalized feeling silly for coming to hospital and would have rather seen her primary MD since she feels she is back to baseline. pt provided stroke handout with "[employer]" . pt very grateful for education and handout. Pt completed bed mobility independent at the end of session. Pt is at or near baseline without OT needs.  Pt demonstrates WFL Rt hand grasp and strength during eval. Pt states " it feels heavy but it works"  OT Diagnosis:    OT Problem List:   OT Treatment Interventions:     OT Goals(Current goals can be found in the care plan section)    Visit  Information  Last OT Received On: 01/18/13 Assistance Needed: +1 History of Present Illness: 62 yo female presents with dysarthria and Rt hand weakness       Prior [place] expects to be discharged to:: Private residence Living Arrangements: Spouse/significant other Available Help at Discharge: Family;Available 24 hours/day Type of Home: House Home Access: Stairs to enter [employer] of Steps: 6 Entrance Stairs-Rails: Left Home Layout: Multi-level Alternate Level Stairs-Number of Steps: 12 Alternate Level Stairs-Rails: Left Home Equipment: None Prior Function Level of Independence: Independent Communication Communication: No difficulties Dominant Hand: Right         Vision/Perception Vision - History Baseline Vision: Wears glasses all the time Patient Visual Report: No change from baseline   Cognition  Cognition Arousal/Alertness: Awake/alert Behavior During Therapy: WFL for tasks assessed/performed Overall Cognitive Status: Within Functional Limits for tasks assessed    Extremity/Trunk Assessment Upper Extremity Assessment Upper Extremity Assessment: Overall WFL for tasks assessed Lower Extremity Assessment Lower Extremity Assessment: Defer to PT evaluation Cervical / Trunk Assessment Cervical / Trunk Assessment: Normal     Mobility Bed Mobility Bed Mobility: Supine to Sit;Sitting - Scoot to Edge of Bed;Sit to Supine Supine to Sit: 7: Independent Sitting - Scoot to Edge of Bed: 7: Independent Sit to Supine: 7: Independent Transfers Sit to Stand: 7: Independent Stand to Sit: 7: Independent     Exercise     Balance Balance Balance Assessed: Yes High Level Balance High Level Balance Activites: Head turns;Turns;Direction changes   End of Session OT - End of  Session Activity Tolerance: Patient tolerated treatment well Patient left: Other (comment) (agreeable to OOB with PT for steps)  GO     Parke Poisson B 01/18/2013, 10:06 AM Pager: [phone]

## 2013-01-18 NOTE — Progress Notes (Signed)
NEURO HOSPITALIST PROGRESS NOTE   SUBJECTIVE:                                                                                                                        Feeling better today, the HA is off and on and less intense. Stated that the right hand still feels " little bit swollen and heavy". Denies further episodes of speech changes, vertigo, double vision, confusion, difficulty swallowing, focal numbness-tingling. MRI-DWI/MRA brain unremarkable.   OBJECTIVE:                                                                                                                           Vital signs in last 24 hours: Temp:  [98 F (36.7 C)-98.5 F (36.9 C)] 98.2 F (36.8 C) (09/12 1000) Pulse Rate:  [50-65] 55 (09/12 1000) Resp:  [16-19] 18 (09/12 1000) BP: (105-144)/(45-79) 113/54 mmHg (09/12 1000) SpO2:  [97 %-100 %] 99 % (09/12 1000) Weight:  [68.04 kg (150 lb)] 68.04 kg (150 lb) (09/11 1401)  Intake/Output from previous day: 09/11 0701 - 09/12 0700 In: 1360 [P.O.:360; I.V.:1000] Out: -  Intake/Output this shift:   Nutritional status: Cardiac  Past Medical History  Diagnosis Date  . Celiac disease     Gluten free diet  . Fibroid   . Elevated cholesterol   . Restless leg syndrome   . Myalgia and myositis, unspecified   . Osteoarthrosis, unspecified whether generalized or localized, unspecified site   . Insomnia   . Other abnormal blood chemistry   . Other symptoms involving cardiovascular system   . Osteopenia   . Hiatal hernia   . Reflux   . STD (sexually transmitted disease)     HSV  . GERD (gastroesophageal reflux disease)   . Thyroid cancer   . Change in voice   . Bruises easily      Neurologic Exam:  Mental Status:  Alert, oriented, thought content appropriate. Speech fluent without evidence of aphasia. Able to follow 3 step commands without difficulty.  Cranial Nerves:  II: Discs flat bilaterally; Visual fields  grossly normal, pupils equal, round, reactive to light and accommodation  III,IV, VI: ptosis not present, extra-ocular motions intact bilaterally  V,VII: smile symmetric, facial light touch  sensation normal bilaterally  VIII: hearing normal bilaterally  IX,X: gag reflex present  XI: bilateral shoulder shrug  XII: midline tongue extension  Motor:  Mild right hand weakness?/?? Pronator drift in the right?? Tone and bulk:normal tone throughout; no atrophy noted  Sensory: Pinprick and light touch intact throughout, bilaterally  Deep Tendon Reflexes:  1 all over  Plantars:  Right: downgoing Left: downgoing  Cerebellar:  normal finger-to-nose, normal heel-to-shin test  Gait:  No ataxia.   Lab Results: Lab Results  Component Value Date/Time   CHOL 170 01/18/2013  5:25 AM   Lipid Panel  Recent Labs  01/18/13 0525  CHOL 170  TRIG 82  HDL 68  CHOLHDL 2.5  VLDL 16  LDLCALC 86    Studies/Results: Dg Chest 2 View  01/17/2013   *RADIOLOGY REPORT*  Clinical Data: Headache and slurred speech  CHEST - 2 VIEW  Comparison: 06/04/2010  Findings: Heart size is normal.  No pleural effusion or edema identified.  No airspace consolidation identified.  Spondylosis noted within the thoracic spine.  IMPRESSION:  1.  No acute cardiopulmonary abnormalities.   Original Report Authenticated By: Kerby Moors, M.D.   Ct Head Wo Contrast  01/17/2013   CLINICAL DATA:  Headache.  Garbled speech.  Right hand weakness.  EXAM: CT HEAD WITHOUT CONTRAST  CT CERVICAL SPINE WITHOUT CONTRAST  TECHNIQUE: Multidetector CT imaging of the head and cervical spine was performed following the standard protocol without intravenous contrast. Multiplanar CT image reconstructions of the cervical spine were also generated.  COMPARISON:  10/16/2012  FINDINGS: CT HEAD FINDINGS  Prior left frontoparietal craniotomy. No acute intracranial abnormality. Specifically, no hemorrhage, hydrocephalus, mass lesion, acute infarction, or  significant intracranial injury. No acute calvarial abnormality. Visualized paranasal sinuses and mastoids clear. Orbital soft tissues unremarkable.  CT CERVICAL SPINE FINDINGS  Mild degenerative disc disease at C4-5. Degenerative facet disease, left greater than right. Normal alignment. Prevertebral soft tissues are normal. Mild left neuroforaminal narrowing at C4-5 and C5-6. No fracture. No epidural or paraspinal hematoma. Prevertebral soft tissues are normal.  IMPRESSION: CT HEAD IMPRESSION  No acute intracranial abnormality.  CT CERVICAL SPINE IMPRESSION  Mild degenerative changes. No acute bony abnormality.   Electronically Signed   By: Rolm Baptise M.D.   On: 01/17/2013 14:57   Ct Cervical Spine Wo Contrast  01/17/2013   CLINICAL DATA:  Headache.  Garbled speech.  Right hand weakness.  EXAM: CT HEAD WITHOUT CONTRAST  CT CERVICAL SPINE WITHOUT CONTRAST  TECHNIQUE: Multidetector CT imaging of the head and cervical spine was performed following the standard protocol without intravenous contrast. Multiplanar CT image reconstructions of the cervical spine were also generated.  COMPARISON:  10/16/2012  FINDINGS: CT HEAD FINDINGS  Prior left frontoparietal craniotomy. No acute intracranial abnormality. Specifically, no hemorrhage, hydrocephalus, mass lesion, acute infarction, or significant intracranial injury. No acute calvarial abnormality. Visualized paranasal sinuses and mastoids clear. Orbital soft tissues unremarkable.  CT CERVICAL SPINE FINDINGS  Mild degenerative disc disease at C4-5. Degenerative facet disease, left greater than right. Normal alignment. Prevertebral soft tissues are normal. Mild left neuroforaminal narrowing at C4-5 and C5-6. No fracture. No epidural or paraspinal hematoma. Prevertebral soft tissues are normal.  IMPRESSION: CT HEAD IMPRESSION  No acute intracranial abnormality.  CT CERVICAL SPINE IMPRESSION  Mild degenerative changes. No acute bony abnormality.   Electronically Signed    By: Rolm Baptise M.D.   On: 01/17/2013 14:57   Mr Brain Wo Contrast  01/18/2013   *RADIOLOGY REPORT*  Clinical Data:  Stroke.  Dizziness.  Slurred speech.  Right arm and hand heaviness.  History of subdural hematoma.  MRI HEAD WITHOUT CONTRAST MRA HEAD WITHOUT CONTRAST  Technique:  Multiplanar, multiecho pulse sequences of the brain and surrounding structures were obtained without intravenous contrast. Angiographic images of the head were obtained using MRA technique without contrast.  Comparison:  Head CT 01/17/2013.  MRI 08/16/2012.  MRI HEAD  Findings:  There has been previous left frontal craniotomy for treatment of subdural hematoma by history.  Otherwise, the examination is normal.  There is no evidence of old or acute infarction, mass lesion, hemorrhage, hydrocephalus or extra-axial collection.  No pituitary mass.  No fluid in the sinuses, middle ears or mastoids.  IMPRESSION: Old left frontal craniotomy for treatment of a subdural hematoma by history.  Other than that, the examination is normal.  No sequela identified.  No old or acute ischemic changes.  MRA HEAD  Findings: Both internal carotid arteries are widely patent into the brain.  The anterior and middle cerebral vessels are normal without proximal stenosis, aneurysm or vascular malformation.  Both vertebral arteries are patent to the basilar.  No basilar stenosis. Posterior circulation branch vessels are normal.  IMPRESSION: Normal intracranial MR angiography of the large and medium-sized vessels.   Original Report Authenticated By: Nelson Chimes, M.D.   Mr Mra Head/brain Wo Cm  01/18/2013   *RADIOLOGY REPORT*  Clinical Data:  Stroke.  Dizziness.  Slurred speech.  Right arm and hand heaviness.  History of subdural hematoma.  MRI HEAD WITHOUT CONTRAST MRA HEAD WITHOUT CONTRAST  Technique:  Multiplanar, multiecho pulse sequences of the brain and surrounding structures were obtained without intravenous contrast. Angiographic images of the head were  obtained using MRA technique without contrast.  Comparison:  Head CT 01/17/2013.  MRI 08/16/2012.  MRI HEAD  Findings:  There has been previous left frontal craniotomy for treatment of subdural hematoma by history.  Otherwise, the examination is normal.  There is no evidence of old or acute infarction, mass lesion, hemorrhage, hydrocephalus or extra-axial collection.  No pituitary mass.  No fluid in the sinuses, middle ears or mastoids.  IMPRESSION: Old left frontal craniotomy for treatment of a subdural hematoma by history.  Other than that, the examination is normal.  No sequela identified.  No old or acute ischemic changes.  MRA HEAD  Findings: Both internal carotid arteries are widely patent into the brain.  The anterior and middle cerebral vessels are normal without proximal stenosis, aneurysm or vascular malformation.  Both vertebral arteries are patent to the basilar.  No basilar stenosis. Posterior circulation branch vessels are normal.  IMPRESSION: Normal intracranial MR angiography of the large and medium-sized vessels.   Original Report Authenticated By: Nelson Chimes, M.D.    MEDICATIONS                                                                                                                       I have reviewed the patient's  current medications.  ASSESSMENT/PLAN:                                                                                                           Transient dysarthria with " heavy/thick" sensation right hand and HA. Neuro-exam with subtle right hand weakness???. She said that she had had back of the HA-posterior neck pain before and apparently was diagnosed with occipital neuralgia, and I wonder if the transient speech changes and " head-neck pain with heavy, thick" hand sensation are 2 different processes  She certainly has not findings on exam to suggest spinal cord involvement and the MRI/MRA brain are unremarkable. Although a TIA diagnosis is possible , her clinical  presentation is unusual because she is still experiencing right hand symptoms and neck-head pain at least 48 hours later.  Carotid doppler pending. She wants to go home today and I will suggest that she gets MRI cervical spine and outpatient neurology follow up to further evaluate her symptoms, and I already explained this to the patient.   Dorian Pod, MD [facility] [phone]  01/18/2013, 12:12 PM

## 2013-01-18 NOTE — Discharge Summary (Signed)
Physician Discharge Summary  Amanda [name] YNW:[id number] DOB: August 21, 1950 DOA: 01/17/2013  PCP: [name], MD  Admit date: 01/17/2013 Discharge date: 01/18/2013  Time spent: > 35 minutes  minutes  Recommendations for Outpatient Follow-up:   F/u with PCP in 1 week with the results of echocardiogram   Discharge Diagnoses:  Principal Problem:   Right arm weakness Active Problems:   HYPERLIPIDEMIA   SUBDURAL HEMATOMA   ACID REFLUX DISEASE   DUODENITIS   Celiac disease   CAROTID BRUIT, LEFT   HYPERGLYCEMIA   THYROID CANCER, HX OF   GERD (gastroesophageal reflux disease)   Restless leg syndrome   Headache(784.0)   Palpitations   Discharge Condition: stable   Diet recommendation: heart healthy   Filed Weights   01/17/13 1401  Weight: 68.04 kg (150 lb)    History of present illness:  62 y.o. female, subdural hematoma requiring bur hole several years ago after a fall and concussion, dyslipidemia, history of palpitations on better blocker, thyroid surgery status post partial thyroid resection, celiac disease, presents to the hospital after she started experiencing sharp pin-like sensation headaches in her occipital area yesterday evening, this was accompanied by slurred speech which lasted a few seconds and then right arm and hand heaviness    Hospital Course:  1. ? TIA; with cervical neuropathy;  symptoms resolved; CT head:nno acute findings, Prior left frontoparietal craniotomy;  -MRI: Old left frontal craniotomy for treatment of a subdural hematoma by history  -pend echo (patient wants to go home, wants to f/u with PCP for the results);  -cont ASA, statin, BP control;  2. HPL cont statin  3. History of palpitations. No acute issue, continue home dose beta blocker  4. History of thyroid cancer post partial thyroid resection. TSH-1.5   Procedures: MRI, CT  Consultations:  Neurology   Discharge Exam: Filed Vitals:   01/18/13 1400  BP: 111/58  Pulse: 58   Temp: 98.3 F (36.8 C)  Resp: 18    General: alert, awake  Cardiovascular: S1, S2 RRR Respiratory: CTA BL   Discharge Instructions  Discharge Orders   Future Appointments Provider Department Dept Phone   02/19/2013 11:30 AM Anastasio Auerbach, MD [facility] [phone]   Future Orders Complete By Expires   Diet - low sodium heart healthy  As directed    Discharge instructions  As directed    Comments:     Please follow up with primary care doctor with the results of echocardiogram   Increase activity slowly  As directed        Medication List    STOP taking these medications       moxifloxacin 400 MG tablet  Commonly known as:  AVELOX     naproxen sodium 220 MG tablet  Commonly known as:  ANAPROX      TAKE these medications       APPLE CIDER VINEGAR PO  Take 4 oz by mouth 2 (two) times daily.     aspirin 81 MG EC tablet  Take 1 tablet (81 mg total) by mouth daily.     atorvastatin 20 MG tablet  Commonly known as:  LIPITOR  Take 10 mg by mouth every evening.     CALCIUM-VITAMIN D PO  Take 2 tablets by mouth daily.     clonazePAM 0.5 MG tablet  Commonly known as:  KLONOPIN  Take 0.5 mg by mouth at bedtime.     cycloSPORINE 0.05 % ophthalmic emulsion  Commonly known as:  RESTASIS  Place 1 drop into both eyes 2 (two) times daily.     metoprolol succinate 25 MG 24 hr tablet  Commonly known as:  TOPROL-XL  Take 25 mg by mouth every evening.     multivitamin tablet  Take 1 tablet by mouth daily.     OVER THE COUNTER MEDICATION  Place 4 drops under the tongue every evening. "white chestnut" tincture for racing mind symptoms     OVER THE COUNTER MEDICATION  Take 2 tablets by mouth every evening. For relaxation     PARoxetine 10 MG tablet  Commonly known as:  PAXIL  Take 10 mg by mouth every morning.     rOPINIRole 0.25 MG tablet  Commonly known as:  REQUIP  Take 0.25 mg by mouth at bedtime.     SYSTANE BALANCE 0.6 % Soln   Generic drug:  Propylene Glycol  Apply 1 drop to eye 2 (two) times daily. For dry eyes, takes along with restasis     VITAMIN D (CHOLECALCIFEROL) PO  Take 2 tablets by mouth daily.       Allergies  Allergen Reactions  . Ambien [Zolpidem Tartrate] Other (See Comments)    Causes Neurological problems with very bad dizziness, pains, disorientation  . Cortisone Other (See Comments)    REACTION: "mania" post ESI  . Eszopiclone And Related Other (See Comments)    Causes Neurological problems with very bad dizziness, pains, disorientation  . Levofloxacin Other (See Comments)    Muscle spasms and jerking movements  . Dicloxacillin Other (See Comments)    Stomach problems, swelling of tongue  . Dilantin [Phenytoin Sodium Extended] Hives and Itching  . Erythromycin Other (See Comments)    Tears stomach up  . Nitrofuran Derivatives Other (See Comments)    unknown  . Phenytoin Sodium Extended Rash  . Trazodone And Nefazodone Other (See Comments)    unknown       Follow-up Information   Follow up with PANG,RICHARD, MD In 1 week.   Specialty:  Internal Medicine   Contact information:   [address], [place] [address] [phone]        The results of significant diagnostics from this hospitalization (including imaging, microbiology, ancillary and laboratory) are listed below for reference.    Significant Diagnostic Studies: Dg Chest 2 View  01/17/2013   *RADIOLOGY REPORT*  Clinical Data: Headache and slurred speech  CHEST - 2 VIEW  Comparison: 06/04/2010  Findings: Heart size is normal.  No pleural effusion or edema identified.  No airspace consolidation identified.  Spondylosis noted within the thoracic spine.  IMPRESSION:  1.  No acute cardiopulmonary abnormalities.   Original Report Authenticated By: Kerby Moors, M.D.   Ct Head Wo Contrast  01/17/2013   CLINICAL DATA:  Headache.  Garbled speech.  Right hand weakness.  EXAM: CT HEAD WITHOUT CONTRAST  CT  CERVICAL SPINE WITHOUT CONTRAST  TECHNIQUE: Multidetector CT imaging of the head and cervical spine was performed following the standard protocol without intravenous contrast. Multiplanar CT image reconstructions of the cervical spine were also generated.  COMPARISON:  10/16/2012  FINDINGS: CT HEAD FINDINGS  Prior left frontoparietal craniotomy. No acute intracranial abnormality. Specifically, no hemorrhage, hydrocephalus, mass lesion, acute infarction, or significant intracranial injury. No acute calvarial abnormality. Visualized paranasal sinuses and mastoids clear. Orbital soft tissues unremarkable.  CT CERVICAL SPINE FINDINGS  Mild degenerative disc disease at C4-5. Degenerative facet disease, left greater than right. Normal alignment. Prevertebral soft tissues are normal. Mild left neuroforaminal  narrowing at C4-5 and C5-6. No fracture. No epidural or paraspinal hematoma. Prevertebral soft tissues are normal.  IMPRESSION: CT HEAD IMPRESSION  No acute intracranial abnormality.  CT CERVICAL SPINE IMPRESSION  Mild degenerative changes. No acute bony abnormality.   Electronically Signed   By: Rolm Baptise M.D.   On: 01/17/2013 14:57   Ct Cervical Spine Wo Contrast  01/17/2013   CLINICAL DATA:  Headache.  Garbled speech.  Right hand weakness.  EXAM: CT HEAD WITHOUT CONTRAST  CT CERVICAL SPINE WITHOUT CONTRAST  TECHNIQUE: Multidetector CT imaging of the head and cervical spine was performed following the standard protocol without intravenous contrast. Multiplanar CT image reconstructions of the cervical spine were also generated.  COMPARISON:  10/16/2012  FINDINGS: CT HEAD FINDINGS  Prior left frontoparietal craniotomy. No acute intracranial abnormality. Specifically, no hemorrhage, hydrocephalus, mass lesion, acute infarction, or significant intracranial injury. No acute calvarial abnormality. Visualized paranasal sinuses and mastoids clear. Orbital soft tissues unremarkable.  CT CERVICAL SPINE FINDINGS  Mild  degenerative disc disease at C4-5. Degenerative facet disease, left greater than right. Normal alignment. Prevertebral soft tissues are normal. Mild left neuroforaminal narrowing at C4-5 and C5-6. No fracture. No epidural or paraspinal hematoma. Prevertebral soft tissues are normal.  IMPRESSION: CT HEAD IMPRESSION  No acute intracranial abnormality.  CT CERVICAL SPINE IMPRESSION  Mild degenerative changes. No acute bony abnormality.   Electronically Signed   By: Rolm Baptise M.D.   On: 01/17/2013 14:57   Mr Brain Wo Contrast  01/18/2013   *RADIOLOGY REPORT*  Clinical Data:  Stroke.  Dizziness.  Slurred speech.  Right arm and hand heaviness.  History of subdural hematoma.  MRI HEAD WITHOUT CONTRAST MRA HEAD WITHOUT CONTRAST  Technique:  Multiplanar, multiecho pulse sequences of the brain and surrounding structures were obtained without intravenous contrast. Angiographic images of the head were obtained using MRA technique without contrast.  Comparison:  Head CT 01/17/2013.  MRI 08/16/2012.  MRI HEAD  Findings:  There has been previous left frontal craniotomy for treatment of subdural hematoma by history.  Otherwise, the examination is normal.  There is no evidence of old or acute infarction, mass lesion, hemorrhage, hydrocephalus or extra-axial collection.  No pituitary mass.  No fluid in the sinuses, middle ears or mastoids.  IMPRESSION: Old left frontal craniotomy for treatment of a subdural hematoma by history.  Other than that, the examination is normal.  No sequela identified.  No old or acute ischemic changes.  MRA HEAD  Findings: Both internal carotid arteries are widely patent into the brain.  The anterior and middle cerebral vessels are normal without proximal stenosis, aneurysm or vascular malformation.  Both vertebral arteries are patent to the basilar.  No basilar stenosis. Posterior circulation branch vessels are normal.  IMPRESSION: Normal intracranial MR angiography of the large and medium-sized  vessels.   Original Report Authenticated By: Nelson Chimes, M.D.   Mr Mra Head/brain Wo Cm  01/18/2013   *RADIOLOGY REPORT*  Clinical Data:  Stroke.  Dizziness.  Slurred speech.  Right arm and hand heaviness.  History of subdural hematoma.  MRI HEAD WITHOUT CONTRAST MRA HEAD WITHOUT CONTRAST  Technique:  Multiplanar, multiecho pulse sequences of the brain and surrounding structures were obtained without intravenous contrast. Angiographic images of the head were obtained using MRA technique without contrast.  Comparison:  Head CT 01/17/2013.  MRI 08/16/2012.  MRI HEAD  Findings:  There has been previous left frontal craniotomy for treatment of subdural hematoma by history.  Otherwise, the examination  is normal.  There is no evidence of old or acute infarction, mass lesion, hemorrhage, hydrocephalus or extra-axial collection.  No pituitary mass.  No fluid in the sinuses, middle ears or mastoids.  IMPRESSION: Old left frontal craniotomy for treatment of a subdural hematoma by history.  Other than that, the examination is normal.  No sequela identified.  No old or acute ischemic changes.  MRA HEAD  Findings: Both internal carotid arteries are widely patent into the brain.  The anterior and middle cerebral vessels are normal without proximal stenosis, aneurysm or vascular malformation.  Both vertebral arteries are patent to the basilar.  No basilar stenosis. Posterior circulation branch vessels are normal.  IMPRESSION: Normal intracranial MR angiography of the large and medium-sized vessels.   Original Report Authenticated By: Nelson Chimes, M.D.    Microbiology: No results found for this or any previous visit (from the past 240 hour(s)).   Labs: Basic Metabolic Panel:  Recent Labs Lab 01/17/13 1507 01/18/13 0525  NA 141 141  K 3.6 4.0  CL 104 105  CO2 28 25  GLUCOSE 94 87  BUN 19 20  CREATININE 0.90 0.79  CALCIUM 10.4 9.3   Liver Function Tests:  Recent Labs Lab 01/17/13 1507  AST 25  ALT 26   ALKPHOS 84  BILITOT 0.5  PROT 7.8  ALBUMIN 4.5   No results found for this basename: LIPASE, AMYLASE,  in the last 168 hours No results found for this basename: AMMONIA,  in the last 168 hours CBC:  Recent Labs Lab 01/17/13 1507 01/18/13 0525  WBC 5.5 5.9  NEUTROABS 3.5  --   HGB 13.7 12.1  HCT 41.4 36.8  MCV 91.6 90.6  PLT 218 211   Cardiac Enzymes: No results found for this basename: CKTOTAL, CKMB, CKMBINDEX, TROPONINI,  in the last 168 hours BNP: BNP (last 3 results) No results found for this basename: PROBNP,  in the last 8760 hours CBG: No results found for this basename: GLUCAP,  in the last 168 hours     Signed:  Kinnie Feil  [facility] 01/18/2013, 3:06 PM

## 2013-02-04 ENCOUNTER — Other Ambulatory Visit: Payer: Self-pay

## 2013-02-04 DIAGNOSIS — Z1231 Encounter for screening mammogram for malignant neoplasm of breast: Secondary | ICD-10-CM

## 2013-02-19 ENCOUNTER — Encounter: Payer: Self-pay | Admitting: Gynecology

## 2013-02-19 ENCOUNTER — Ambulatory Visit (INDEPENDENT_AMBULATORY_CARE_PROVIDER_SITE_OTHER): Payer: BC Managed Care – PPO | Admitting: Gynecology

## 2013-02-19 VITALS — BP 112/70 | Ht 65.5 in | Wt 150.0 lb

## 2013-02-19 DIAGNOSIS — M899 Disorder of bone, unspecified: Secondary | ICD-10-CM

## 2013-02-19 DIAGNOSIS — Z01419 Encounter for gynecological examination (general) (routine) without abnormal findings: Secondary | ICD-10-CM

## 2013-02-19 DIAGNOSIS — N952 Postmenopausal atrophic vaginitis: Secondary | ICD-10-CM

## 2013-02-19 DIAGNOSIS — M8580 Other specified disorders of bone density and structure, unspecified site: Secondary | ICD-10-CM

## 2013-02-19 NOTE — Patient Instructions (Signed)
Followup for bone density as scheduled.

## 2013-02-19 NOTE — Progress Notes (Signed)
Amanda [name] 28-Nov-1950 [id number]        62 y.o.  G2P2002 for annual exam.  Former patient of Dr. Cherylann Banas. Doing well.  Past medical history,surgical history, medications, allergies, family history and social history were all reviewed and documented in the EPIC chart.  ROS:  Performed and pertinent positives and negatives are included in the history, assessment and plan .  Exam: Kim assistant Filed Vitals:   02/19/13 1126  BP: 112/70  Height: 5' 5.5" (1.664 m)  Weight: 150 lb (68.04 kg)   General appearance  Normal Skin grossly normal Head/Neck normal with no cervical or supraclavicular adenopathy thyroid normal Lungs  clear Cardiac RR, without RMG Abdominal  soft, nontender, without masses, organomegaly or hernia Breasts  examined lying and sitting without masses, retractions, discharge or axillary adenopathy. Bilateral reduction scars. Pelvic  Ext/BUS/vagina  normal with atrophic genital changes   Adnexa  Without masses or tenderness    Anus and perineum  normal   Rectovaginal  normal sphincter tone without palpated masses or tenderness.    Assessment/Plan:  62 y.o. G19P2002 female for annual exam.   1. Postmenopausal. 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 Doing well without HRT. No significant hot flashes night sweats vaginal dryness dyspareunia. Will continue to monitor. 2. Osteopenia. DEXA 2012 T score -2.0. Had transiently been on Fosamax for 3-4 years from 2000 11/08/2008. FRAX calculation recognizing she had been on bisphosphate showed 9%/1% with her last DEXA. Plan repeat DEXA now. Increase calcium vitamin D reviewed. 3. Pap smear 2012. No Pap smear done today. No history of abnormal Pap smears previously. Status post hysterectomy for benign indications. Options to stop screening altogether or less frequent screening intervals reviewed. Will readdress on an annual basis. 4. Mammography due now patient is to schedule. SBE monthly reviewed. 5. Colonoscopy  5 years ago. Repeat at their recommended interval. 6. History of thyroid cancer status post partial thyroidectomy. Exam is negative today. Continue to monitor. 7. Health maintenance. Just had blood work done for her primary physician appointment. No lab work done today. Followup for bone density otherwise one year, sooner as needed  Note: This document was prepared with digital dictation and possible smart phrase technology. Any transcriptional errors that result from this process are unintentional.   [name], 11:57 AM 02/19/2013

## 2013-02-27 ENCOUNTER — Telehealth: Payer: Self-pay | Admitting: Gastroenterology

## 2013-02-27 NOTE — Telephone Encounter (Signed)
lmom for pt to call back

## 2013-02-28 ENCOUNTER — Ambulatory Visit
Admission: RE | Admit: 2013-02-28 | Discharge: 2013-02-28 | Disposition: A | Discharge status: Home / Self Care | Payer: BC Managed Care – PPO | Source: Ambulatory Visit

## 2013-02-28 DIAGNOSIS — Z1231 Encounter for screening mammogram for malignant neoplasm of breast: Secondary | ICD-10-CM

## 2013-04-03 NOTE — Telephone Encounter (Signed)
Pt never called back.

## 2013-04-09 ENCOUNTER — Ambulatory Visit (INDEPENDENT_AMBULATORY_CARE_PROVIDER_SITE_OTHER): Payer: BC Managed Care – PPO

## 2013-04-09 ENCOUNTER — Encounter: Payer: Self-pay | Admitting: Gynecology

## 2013-04-09 DIAGNOSIS — M8580 Other specified disorders of bone density and structure, unspecified site: Secondary | ICD-10-CM

## 2013-04-09 DIAGNOSIS — M899 Disorder of bone, unspecified: Secondary | ICD-10-CM

## 2013-04-16 ENCOUNTER — Ambulatory Visit (INDEPENDENT_AMBULATORY_CARE_PROVIDER_SITE_OTHER): Payer: BC Managed Care – PPO | Admitting: Cardiology

## 2013-04-16 ENCOUNTER — Encounter: Payer: Self-pay | Admitting: Cardiology

## 2013-04-16 VITALS — BP 120/82 | HR 68 | Ht 66.0 in | Wt 159.0 lb

## 2013-04-16 DIAGNOSIS — E785 Hyperlipidemia, unspecified: Secondary | ICD-10-CM

## 2013-04-16 DIAGNOSIS — R002 Palpitations: Secondary | ICD-10-CM

## 2013-04-16 NOTE — Patient Instructions (Signed)
So things seem pretty stable.   If the palpitations are all but gone, you could try to come off of the Toprol (metoprolol succinate) - you would reduce to 1/2 tab daily x 2 weeks, then 1/2 tab every other day for 2 weeks.  It the palpitations do not return, I would simply keep a your toprol handy in case if recurs.   I will keep your Prescription active.  Just in case you end up needing to stay on it, I will keep you annual follow-up appointment on the books (in order to provide a refill prescription).  If things are stable, your 1 yr follow-up could be AS NEEDED.  Leonie Man, MD  Your physician wants you to follow-up in: 49yr You will receive a reminder letter in the mail two months in advance. If you don't receive a letter, please call our office to schedule the follow-up appointment.

## 2013-04-17 ENCOUNTER — Encounter: Payer: Self-pay | Admitting: Cardiology

## 2013-04-18 ENCOUNTER — Encounter: Payer: Self-pay | Admitting: Cardiology

## 2013-04-18 NOTE — Assessment & Plan Note (Signed)
Relatively well-controlled on atorvastatin.

## 2013-04-18 NOTE — Assessment & Plan Note (Addendum)
Her symptoms seem to be relatively well-controlled and stable. She would like to see she can stop her metoprolol. I think she probably can, but I do want her to taper off. The instructions are as follows: We'll wait until after Christmas.  If the palpitations are all but gone, you could try to come off of the Toprol (metoprolol succinate) - you would reduce to 1/2 tab daily x 2 weeks, then 1/2 tab every other day for 2 weeks.  It the palpitations do not return, I would simply keep a your toprol handy in case if recurs.   I will keep your Prescription active.  Just in case you end up needing to stay on it, I will keep you annual follow-up appointment on the books (in order to provide a refill prescription).

## 2013-04-18 NOTE — Progress Notes (Signed)
PATIENT: Amanda [name] MRN: [id number] DOB: 08-10-1950 PCP: [name], MD  Clinic Note: Chief Complaint  Patient presents with  . 6 month visit    no chest pain ,  ,no edema,no sob    HPI: Amanda [name] is a 62 y.o. female with a PMH below who presents today for six-month followup mostly for palpitations. She is a for patient of Dr. Rollene Fare, here to establish new cardiology care after his retirement. She has a history of palpitations and hypertension. Previously has atypical chest pain evaluated the Myoview is negative. She does have relatively strong family history of coronary disease pulse hyperlipidemia. She herself has been put on atorvastatin and her labs are being followed by her primary physician.  Interval History: She comes in today doing relatively well. Since her last visit she was noted to the hospital in September symptoms that sound like possible TIA. Things seemed to have panned out where she had no further symptoms. She did an echocardiogram done that was relatively benign/normal. She says her palpitations at all but gone away. She is related only notes a few PVCs here and there.  The remainder of cardiac review of systems is as follows: Cardiovascular ROS: no chest pain or dyspnea on exertion negative for - edema, irregular heartbeat, loss of consciousness, murmur, orthopnea, paroxysmal nocturnal dyspnea, rapid heart rate or shortness of breath : Additional cardiac review of systems: Lightheadedness - no, dizziness - no, syncope/near-syncope - no; TIA/amaurosis fugax - no Melena - no, hematochezia no; hematuria - no; nosebleeds - no; claudication - no  Past Medical History  Diagnosis Date  . Celiac disease     Gluten free diet  . Elevated cholesterol   . Restless leg syndrome   . Myalgia and myositis, unspecified   . Osteoarthrosis, unspecified whether generalized or localized, unspecified site   . Insomnia   . Other abnormal blood chemistry   . Other  symptoms involving cardiovascular system   . Osteopenia 04/2013    T score -2.0 FRAX 9.2%/1.1%  . Hiatal hernia   . Reflux   . STD (sexually transmitted disease)     HSV  . GERD (gastroesophageal reflux disease)   . Thyroid cancer   . Change in voice   . Bruises easily     Prior Cardiac Evaluation and Past Surgical History: Past Surgical History  Procedure Laterality Date  . Knee surgery  2002    both knees  . Thyroid surgery      Partial thyroidectomy  . Abdominal hysterectomy  1986    TAH  . Breast surgery  1999    Reduction  . Brain surgery  2010    hemorrhage  . Nm myoview ltd  November 2012    No ischemia or infarction  . Transthoracic echocardiogram   April 2013; 01/18/2013    Grade 1 diastolic dysfunction; normal EF 60-65%. No significant valve disease    Allergies  Allergen Reactions  . Ambien [Zolpidem Tartrate] Other (See Comments)    Causes Neurological problems with very bad dizziness, pains, disorientation  . Cortisone Other (See Comments)    REACTION: "mania" post ESI  . Eszopiclone And Related Other (See Comments)    Causes Neurological problems with very bad dizziness, pains, disorientation  . Levofloxacin Other (See Comments)    Muscle spasms and jerking movements  . Dicloxacillin Other (See Comments)    Stomach problems, swelling of tongue  . Dilantin [Phenytoin Sodium Extended] Hives and Itching  . Erythromycin Other (See  Comments)    Tears stomach up  . Nitrofuran Derivatives Other (See Comments)    unknown  . Phenytoin Sodium Extended Rash  . Trazodone And Nefazodone Other (See Comments)    unknown    Current Outpatient Prescriptions  Medication Sig Dispense Refill  . APPLE CIDER VINEGAR PO Take 4 oz by mouth 2 (two) times daily.      [name] aspirin EC 81 MG EC tablet Take 1 tablet (81 mg total) by mouth daily.      [name] atorvastatin (LIPITOR) 20 MG tablet Take 10 mg by mouth every evening.      . clonazePAM (KLONOPIN) 0.5 MG tablet Take 0.5 mg  by mouth at bedtime.      . cycloSPORINE (RESTASIS) 0.05 % ophthalmic emulsion Place 1 drop into both eyes 2 (two) times daily.      . metoprolol succinate (TOPROL-XL) 25 MG 24 hr tablet Take 25 mg by mouth every evening.      . Multiple Vitamin (MULTIVITAMIN) tablet Take 1 tablet by mouth daily.        [name] PARoxetine (PAXIL) 10 MG tablet Take 10 mg by mouth every morning.      [name] Propylene Glycol (SYSTANE BALANCE) 0.6 % SOLN Apply 1 drop to eye 2 (two) times daily. For dry eyes, takes along with restasis      . rOPINIRole (REQUIP) 0.25 MG tablet Take 0.25 mg by mouth at bedtime.      [name] VITAMIN D, CHOLECALCIFEROL, PO Take 2 tablets by mouth daily.      [name] CALCIUM-VITAMIN D PO Take 2 tablets by mouth daily.      Glory Rosebush DELICA LANCETS 17O MISC        No current facility-administered medications for this visit.    History   Social History Narrative   Married mother of 2 with no grandchildren.   Never smoked.   Angulatory and active. 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   FAMILY HISTORY: family history includes Breast cancer in her maternal aunt; Diabetes in her maternal aunt; Heart disease in her maternal grandfather; Hyperlipidemia in her maternal grandfather; Hypertension in her paternal grandfather; Liver cancer in her father. There is no history of Colon cancer.  ROS: A comprehensive Review of Systems - Negative except GERD symptoms; no recurrent TIA or amaurosis fugax symptoms  PHYSICAL EXAM BP 120/82  Pulse 68  Ht 5' 6"  (1.676 m)  Wt 159 lb (72.122 kg)  BMI 25.68 kg/m2 General appearance: Alert very x3, answers questions appropriately. Well-nourished and well-groomed. Normal mood and affect.[name] HEENT: [place]/AT, EOMI, MMM, anicteric sclera Neck: no adenopathy, no carotid bruit, no JVD, supple, symmetrical, trachea midline and thyroid not enlarged, symmetric, no tenderness/mass/nodules Lungs: clear to auscultation  bilaterally, normal percussion bilaterally and nonlabored, good movement Heart: normal apical impulse, regularly irregular rhythm, S1, mild splitting of S2 normal and soft 1-2/6 HSM at the base. Does not radiate. I do not hear an S4 gallop. No rubs Abdomen: soft, non-tender; bowel sounds normal; no masses,  no organomegaly Extremities: extremities normal, atraumatic, no cyanosis or edema and no ulcers, gangrene or trophic changes Pulses: 2+ and symmetric Neurologic: Grossly normal  HYW:VPXTGGYIR today: Yes Rate: 68 , Rhythm: NSR with somewhat shortened PR interval. No pre-excitation;    Recent Labs: From 01/18/2013: TIG 170, TCA 2, HDL 68, LDL 86 --very well controlled, but not as good as in December 2013.  ASSESSMENT / PLAN: Overall very  stable from cardiac standpoint. Her palpitations may very much improved. I think we can start to wean off the beta blocker and see how she's doing. If things are stable, in the next 1 yr follow-up could be AS NEEDED.  Heart palpitations Her symptoms seem to be relatively well-controlled and stable. She would like to see she can stop her metoprolol. I think she probably can, but I do want her to taper off. The instructions are as follows: We'll wait until after Christmas.  If the palpitations are all but gone, you could try to come off of the Toprol (metoprolol succinate) - you would reduce to 1/2 tab daily x 2 weeks, then 1/2 tab every other day for 2 weeks.  It the palpitations do not return, I would simply keep a your toprol handy in case if recurs.   I will keep your Prescription active.  Just in case you end up needing to stay on it, I will keep you annual follow-up appointment on the books (in order to provide a refill prescription).  HYPERLIPIDEMIA Relatively well-controlled on atorvastatin.    Orders Placed This Encounter  Procedures  . EKG 12-Lead   Meds ordered this encounter  Medications  . ONETOUCH DELICA LANCETS 18D MISC    Sig:      Emerick Weatherly W. Ellyn Hack, M.D., M.S. [facility] [address] [place], [place]  [address]  [phone] Pager # [phone]

## 2013-09-24 ENCOUNTER — Other Ambulatory Visit: Payer: Self-pay | Admitting: *Deleted

## 2013-09-24 ENCOUNTER — Other Ambulatory Visit: Payer: Self-pay

## 2013-09-24 MED ORDER — METOPROLOL SUCCINATE ER 25 MG PO TB24: 25.0000 mg | ORAL_TABLET | Freq: Every evening | ORAL | Status: DC

## 2013-09-24 NOTE — Telephone Encounter (Signed)
Rx was sent to pharmacy electronically. 

## 2013-09-26 ENCOUNTER — Ambulatory Visit (INDEPENDENT_AMBULATORY_CARE_PROVIDER_SITE_OTHER): Payer: BC Managed Care – PPO | Admitting: Women's Health

## 2013-09-26 ENCOUNTER — Telehealth: Payer: Self-pay | Admitting: *Deleted

## 2013-09-26 DIAGNOSIS — N644 Mastodynia: Secondary | ICD-10-CM

## 2013-09-26 NOTE — Progress Notes (Signed)
Patient ID: Amanda [name], female   DOB: [birth year], 63 y.o.   MRN: [id number] Presents with complaint of intermittent left nipple itching for approximately one year, past week left nipple pain, shooting in nature, tingling with external nipple itching. Denies nipple discharge, injury,  change in routine, exercise or diet. Reports nipple itching as external. Normal 3D mammogram 02/2013. 1986 TAH for fibroids on no HRT. Denies family history of breast cancer.  Exam: Breast exam in sitting and lying positioning without palpable nodules, retractions, dimpling. Well healed scars from breast reduction. Tenderness noted in left areola. No visible erythema  Mastodynia with pruritus  Plan: Diagnostic left mammogram with ultrasound. Vitamin E twice daily, avoid caffeinated beverages.

## 2013-09-26 NOTE — Telephone Encounter (Signed)
Appointment 10/07/13 @ 11:15 am

## 2013-09-26 NOTE — Telephone Encounter (Signed)
Message copied by Thamas Jaegers on Thu Sep 26, 2013  9:33 AM ------      Message from: [place], [place] J      Created: Thu Sep 26, 2013  9:15 AM       Please schedule [place] and diagnostic mammogram for the left breast for after next wed 5/27  She has had her previous mammos at the breast center.  Cameo's symptoms are pain esp at nipple, itching tingling, heat   no change in exam, no nodules or nipple discharge.  Itching off and on months, pain 1 week. ------

## 2013-10-07 ENCOUNTER — Ambulatory Visit
Admission: RE | Admit: 2013-10-07 | Discharge: 2013-10-07 | Disposition: A | Discharge status: Home / Self Care | Payer: BC Managed Care – PPO | Source: Ambulatory Visit | Attending: Women's Health | Admitting: Women's Health

## 2013-10-07 DIAGNOSIS — N644 Mastodynia: Secondary | ICD-10-CM

## 2013-12-12 ENCOUNTER — Other Ambulatory Visit: Payer: Self-pay | Admitting: Obstetrics and Gynecology

## 2013-12-12 NOTE — Telephone Encounter (Signed)
Pt will be leaving for [place] tomorrow and would like to have on hand for cold sores.

## 2014-01-06 ENCOUNTER — Other Ambulatory Visit: Payer: Self-pay

## 2014-01-06 DIAGNOSIS — Z1231 Encounter for screening mammogram for malignant neoplasm of breast: Secondary | ICD-10-CM

## 2014-02-05 ENCOUNTER — Other Ambulatory Visit: Payer: Self-pay | Admitting: Internal Medicine

## 2014-02-05 DIAGNOSIS — Z85850 Personal history of malignant neoplasm of thyroid: Secondary | ICD-10-CM

## 2014-02-10 ENCOUNTER — Ambulatory Visit
Admission: RE | Admit: 2014-02-10 | Discharge: 2014-02-10 | Disposition: A | Discharge status: Home / Self Care | Payer: BC Managed Care – PPO | Source: Ambulatory Visit | Attending: Internal Medicine | Admitting: Internal Medicine

## 2014-02-10 DIAGNOSIS — Z85850 Personal history of malignant neoplasm of thyroid: Secondary | ICD-10-CM

## 2014-02-24 ENCOUNTER — Ambulatory Visit (INDEPENDENT_AMBULATORY_CARE_PROVIDER_SITE_OTHER): Payer: BC Managed Care – PPO | Admitting: Gynecology

## 2014-02-24 ENCOUNTER — Other Ambulatory Visit (HOSPITAL_COMMUNITY)
Admission: RE | Admit: 2014-02-24 | Discharge: 2014-02-24 | Disposition: A | Discharge status: Home / Self Care | Payer: BC Managed Care – PPO | Source: Ambulatory Visit | Attending: Gynecology | Admitting: Gynecology

## 2014-02-24 ENCOUNTER — Encounter: Payer: Self-pay | Admitting: Gynecology

## 2014-02-24 VITALS — BP 120/74 | Ht 66.0 in | Wt 159.0 lb

## 2014-02-24 DIAGNOSIS — B001 Herpesviral vesicular dermatitis: Secondary | ICD-10-CM

## 2014-02-24 DIAGNOSIS — M8580 Other specified disorders of bone density and structure, unspecified site: Secondary | ICD-10-CM

## 2014-02-24 DIAGNOSIS — Z01419 Encounter for gynecological examination (general) (routine) without abnormal findings: Secondary | ICD-10-CM | POA: Diagnosis present

## 2014-02-24 NOTE — Patient Instructions (Signed)
You may obtain a copy of any labs that were done today by logging onto MyChart as outlined in the instructions provided with your AVS (after visit summary). The office will not call with normal lab results but certainly if there are any significant abnormalities then we will contact you.   Health Maintenance, Female A healthy lifestyle and preventative care can promote health and wellness.  Maintain regular health, dental, and eye exams.  Eat a healthy diet. Foods like vegetables, fruits, whole grains, low-fat dairy products, and lean protein foods contain the nutrients you need without too many calories. Decrease your intake of foods high in solid fats, added sugars, and salt. Get information about a proper diet from your caregiver, if necessary.  Regular physical exercise is one of the most important things you can do for your health. Most adults should get at least 150 minutes of moderate-intensity exercise (any activity that increases your heart rate and causes you to sweat) each week. In addition, most adults need muscle-strengthening exercises on 2 or more days a week.   Maintain a healthy weight. The body mass index (BMI) is a screening tool to identify possible weight problems. It provides an estimate of body fat based on height and weight. Your caregiver can help determine your BMI, and can help you achieve or maintain a healthy weight. For adults 20 years and older:  A BMI below 18.5 is considered underweight.  A BMI of 18.5 to 24.9 is normal.  A BMI of 25 to 29.9 is considered overweight.  A BMI of 30 and above is considered obese.  Maintain normal blood lipids and cholesterol by exercising and minimizing your intake of saturated fat. Eat a balanced diet with plenty of fruits and vegetables. Blood tests for lipids and cholesterol should begin at age 61 and be repeated every 5 years. If your lipid or cholesterol levels are high, you are over 50, or you are a high risk for heart  disease, you may need your cholesterol levels checked more frequently.Ongoing high lipid and cholesterol levels should be treated with medicines if diet and exercise are not effective.  If you smoke, find out from your caregiver how to quit. If you do not use tobacco, do not start.  Lung cancer screening is recommended for adults aged 33 80 years who are at high risk for developing lung cancer because of a history of smoking. Yearly low-dose computed tomography (CT) is recommended for people who have at least a 30-pack-year history of smoking and are a current smoker or have quit within the past 15 years. A pack year of smoking is smoking an average of 1 pack of cigarettes a day for 1 year (for example: 1 pack a day for 30 years or 2 packs a day for 15 years). Yearly screening should continue until the smoker has stopped smoking for at least 15 years. Yearly screening should also be stopped for people who develop a health problem that would prevent them from having lung cancer treatment.  If you are pregnant, do not drink alcohol. If you are breastfeeding, be very cautious about drinking alcohol. If you are not pregnant and choose to drink alcohol, do not exceed 1 drink per day. One drink is considered to be 12 ounces (355 mL) of beer, 5 ounces (148 mL) of wine, or 1.5 ounces (44 mL) of liquor.  Avoid use of street drugs. Do not share needles with anyone. Ask for help if you need support or instructions about stopping  the use of drugs.  High blood pressure causes heart disease and increases the risk of stroke. Blood pressure should be checked at least every 1 to 2 years. Ongoing high blood pressure should be treated with medicines, if weight loss and exercise are not effective.  If you are 59 to 64 years old, ask your caregiver if you should take aspirin to prevent strokes.  Diabetes screening involves taking a blood sample to check your fasting blood sugar level. This should be done once every 3  years, after age 91, if you are within normal weight and without risk factors for diabetes. Testing should be considered at a younger age or be carried out more frequently if you are overweight and have at least 1 risk factor for diabetes.  Breast cancer screening is essential preventative care for women. You should practice "breast self-awareness." This means understanding the normal appearance and feel of your breasts and may include breast self-examination. Any changes detected, no matter how small, should be reported to a caregiver. Women in their 66s and 30s should have a clinical breast exam (CBE) by a caregiver as part of a regular health exam every 1 to 3 years. After age 101, women should have a CBE every year. Starting at age 100, women should consider having a mammogram (breast X-ray) every year. Women who have a family history of breast cancer should talk to their caregiver about genetic screening. Women at a high risk of breast cancer should talk to their caregiver about having an MRI and a mammogram every year.  Breast cancer gene (BRCA)-related cancer risk assessment is recommended for women who have family members with BRCA-related cancers. BRCA-related cancers include breast, ovarian, tubal, and peritoneal cancers. Having family members with these cancers may be associated with an increased risk for harmful changes (mutations) in the breast cancer genes BRCA1 and BRCA2. Results of the assessment will determine the need for genetic counseling and BRCA1 and BRCA2 testing.  The Pap test is a screening test for cervical cancer. Women should have a Pap test starting at age 57. Between ages 25 and 35, Pap tests should be repeated every 2 years. Beginning at age 37, you should have a Pap test every 3 years as long as the past 3 Pap tests have been normal. If you had a hysterectomy for a problem that was not cancer or a condition that could lead to cancer, then you no longer need Pap tests. If you are  between ages 50 and 76, and you have had normal Pap tests going back 10 years, you no longer need Pap tests. If you have had past treatment for cervical cancer or a condition that could lead to cancer, you need Pap tests and screening for cancer for at least 20 years after your treatment. If Pap tests have been discontinued, risk factors (such as a new sexual partner) need to be reassessed to determine if screening should be resumed. Some women have medical problems that increase the chance of getting cervical cancer. In these cases, your caregiver may recommend more frequent screening and Pap tests.  The human papillomavirus (HPV) test is an additional test that may be used for cervical cancer screening. The HPV test looks for the virus that can cause the cell changes on the cervix. The cells collected during the Pap test can be tested for HPV. The HPV test could be used to screen women aged 44 years and older, and should be used in women of any age  who have unclear Pap test results. After the age of 55, women should have HPV testing at the same frequency as a Pap test.  Colorectal cancer can be detected and often prevented. Most routine colorectal cancer screening begins at the age of 44 and continues through age 20. However, your caregiver may recommend screening at an earlier age if you have risk factors for colon cancer. On a yearly basis, your caregiver may provide home test kits to check for hidden blood in the stool. Use of a small camera at the end of a tube, to directly examine the colon (sigmoidoscopy or colonoscopy), can detect the earliest forms of colorectal cancer. Talk to your caregiver about this at age 86, when routine screening begins. Direct examination of the colon should be repeated every 5 to 10 years through age 13, unless early forms of pre-cancerous polyps or small growths are found.  Hepatitis C blood testing is recommended for all people born from [birth year] through 1966 and any  individual with known risks for hepatitis C.  Practice safe sex. Use condoms and avoid high-risk sexual practices to reduce the spread of sexually transmitted infections (STIs). Sexually active women aged 36 and younger should be checked for Chlamydia, which is a common sexually transmitted infection. Older women with new or multiple partners should also be tested for Chlamydia. Testing for other STIs is recommended if you are sexually active and at increased risk.  Osteoporosis is a disease in which the bones lose minerals and strength with aging. This can result in serious bone fractures. The risk of osteoporosis can be identified using a bone density scan. Women ages 20 and over and women at risk for fractures or osteoporosis should discuss screening with their caregivers. Ask your caregiver whether you should be taking a calcium supplement or vitamin D to reduce the rate of osteoporosis.  Menopause can be associated with physical symptoms and risks. Hormone replacement therapy is available to decrease symptoms and risks. You should talk to your caregiver about whether hormone replacement therapy is right for you.  Use sunscreen. Apply sunscreen liberally and repeatedly throughout the day. You should seek shade when your shadow is shorter than you. Protect yourself by wearing long sleeves, pants, a wide-brimmed hat, and sunglasses year round, whenever you are outdoors.  Notify your caregiver of new moles or changes in moles, especially if there is a change in shape or color. Also notify your caregiver if a mole is larger than the size of a pencil eraser.  Stay current with your immunizations. Document Released: 11/08/2010 Document Revised: 08/20/2012 Document Reviewed: 11/08/2010 [facility] Patient Information 2014 [employer].

## 2014-02-24 NOTE — Progress Notes (Signed)
Amanda [name] 1950/11/28 [id number]        63 y.o.  G2P2002 for annual exam.  Several issues noted below.  Past medical history,surgical history, problem list, medications, allergies, family history and social history were all reviewed and documented as reviewed in the EPIC chart.  ROS:  12 system ROS performed with pertinent positives and negatives included in the history, assessment and plan.   Additional significant findings :  none   Exam: [name] Vitals:   02/24/14 1459  BP: 120/74  Height: 5' 6"  (1.676 m)  Weight: 159 lb (72.122 kg)   General appearance:  Normal affect, orientation and appearance. Skin: Grossly normal with small classic cold sore right upper lip HEENT: Without gross lesions.  No cervical or supraclavicular adenopathy. Thyroid normal.  Lungs:  Clear without wheezing, rales or rhonchi Cardiac: RR, without RMG Abdominal:  Soft, nontender, without masses, guarding, rebound, organomegaly or hernia Breasts:  Examined lying and sitting without masses, retractions, discharge or axillary adenopathy.  Bilateral reduction scars noted. Pelvic:  Ext/BUS/vagina with generalized atrophic changes  Adnexa  Without masses or tenderness    Anus and perineum  Normal   Rectovaginal  Normal sphincter tone without palpated masses or tenderness.    Assessment/Plan:  63 y.o. G79P2002 female for annual exam.   1. Postmenopausal/atrophic genital changes. 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 Doing well without significant hot flushes, night sweats, vaginal dryness or dyspareunia. Will continue to monitor.  Follow up if issues develop. 2. Osteopenia. DEXA 04/2013 T score -2.0 FRAX 9%/1.1%. Plan repeat DEXA next year a 2 year interval. Increased calcium vitamin D reviewed. 3. 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  Uses Valtrex 500 mg for occasional outbreaks.  Usually takes it for several days to abort the outbreak. Reviewed increasing dose to the 2 g  level versus increasing the frequency of the 500 mg twice a day. Patient wants to try this first. Has supply at home but will call if she needs more. 4. Pap smear 2012. Pap of vaginal cuff done today. History of hysterectomy for benign indications. History of significant abnormal Pap smears. Reviewed current screening guidelines and options to stop screening but she is uncomfortable with this. 5. Colonoscopy 5 years ago. Patient unsure when to repeat it and she is going to call [facility] where she had it done to find out when they want her to repeat this and schedule it. 6. Mammography 10/2013. Continue with annual mammography. SBE monthly reviewed. 7. Health maintenance. No routine blood work done and she has this done at her primary physician's office. Follow up one year, sooner as needed.     [name], 3:25 PM 02/24/2014

## 2014-02-24 NOTE — Addendum Note (Signed)
Addended by: Nelva Nay on: 02/24/2014 03:44 PM   Modules accepted: Orders

## 2014-02-25 LAB — URINALYSIS W MICROSCOPIC + REFLEX CULTURE
BILIRUBIN URINE: NEGATIVE
Bacteria, UA: NONE SEEN
CASTS: NONE SEEN
CRYSTALS: NONE SEEN
Glucose, UA: NEGATIVE mg/dL
HGB URINE DIPSTICK: NEGATIVE
Ketones, ur: NEGATIVE mg/dL
Leukocytes, UA: NEGATIVE
NITRITE: NEGATIVE
PH: 6 (ref 5.0–8.0)
Protein, ur: NEGATIVE mg/dL
SPECIFIC GRAVITY, URINE: 1.011 (ref 1.005–1.030)
Squamous Epithelial / LPF: NONE SEEN
UROBILINOGEN UA: 0.2 mg/dL (ref 0.0–1.0)

## 2014-02-26 LAB — CYTOLOGY - PAP

## 2014-03-03 ENCOUNTER — Telehealth: Payer: Self-pay | Admitting: Gastroenterology

## 2014-03-04 NOTE — Telephone Encounter (Signed)
Pt had colon done with Dr. Sharlett Iles 08/07/2017. Per report it was a normal exam. Pt would be due for a 10 year recall unless any family history of colon cancer. Left message for pt to call back.

## 2014-03-07 NOTE — Telephone Encounter (Signed)
Left message for patient to call back  

## 2014-03-10 NOTE — Telephone Encounter (Signed)
Patient given recommendations. 

## 2014-03-10 NOTE — Telephone Encounter (Signed)
Left a message for patient to call back. 

## 2014-03-11 ENCOUNTER — Ambulatory Visit: Payer: BC Managed Care – PPO | Admitting: Internal Medicine

## 2014-03-12 ENCOUNTER — Ambulatory Visit
Admission: RE | Admit: 2014-03-12 | Discharge: 2014-03-12 | Disposition: A | Discharge status: Home / Self Care | Payer: BC Managed Care – PPO | Source: Ambulatory Visit

## 2014-03-12 DIAGNOSIS — Z1231 Encounter for screening mammogram for malignant neoplasm of breast: Secondary | ICD-10-CM

## 2014-04-21 ENCOUNTER — Encounter: Payer: Self-pay | Admitting: Cardiology

## 2014-04-21 ENCOUNTER — Ambulatory Visit (INDEPENDENT_AMBULATORY_CARE_PROVIDER_SITE_OTHER): Payer: BC Managed Care – PPO | Admitting: Cardiology

## 2014-04-21 VITALS — BP 150/80 | HR 70 | Ht 65.5 in | Wt 162.0 lb

## 2014-04-21 DIAGNOSIS — E7800 Pure hypercholesterolemia, unspecified: Secondary | ICD-10-CM

## 2014-04-21 DIAGNOSIS — R002 Palpitations: Secondary | ICD-10-CM

## 2014-04-21 DIAGNOSIS — R413 Other amnesia: Secondary | ICD-10-CM

## 2014-04-21 DIAGNOSIS — R0789 Other chest pain: Secondary | ICD-10-CM

## 2014-04-21 MED ORDER — EZETIMIBE 10 MG PO TABS: 10.0000 mg | ORAL_TABLET | Freq: Every day | ORAL | Status: DC

## 2014-04-21 NOTE — Assessment & Plan Note (Signed)
She intermittently has discomfort in her chest is never really associated with exertion or dyspnea. Just intermittently has twinges in her chest. Probably when she feels her palpitations.

## 2014-04-21 NOTE — Patient Instructions (Addendum)
HOLD LIPITOR (ATORVASTATIN) STARTING IN JAN 2016 FOR ONE FULL MONTH THEN START ZETIA 10 MG A DAILY --- pick up in feb 2016  HAVE LABS IN 3 MONTHS AFTER STARTING ZETIA . Will mail lab slip to you   Your physician wants you to follow-up in 12 MONTHS Dr Ellyn Hack.  You will receive a reminder letter in the mail two months in advance. If you don't receive a letter, please call our office to schedule the follow-up appointment.

## 2014-04-21 NOTE — Assessment & Plan Note (Signed)
See above. We will do a trial run to see if there is a statin involvement factor.

## 2014-04-21 NOTE — Assessment & Plan Note (Signed)
Well controlled on Toprol - I doubt that Toprol is having an effect on her memory.

## 2014-04-21 NOTE — Progress Notes (Signed)
PCP: [name], MD  Clinic Note: Chief Complaint  Patient presents with  . Follow-up    One year  . Palpitations    can still feel the "Extra beat" when she misses a dose of Metoprolol .  no complaints  . Hyperlipidemia  . Memory Loss    Concerned about possible medication affect    HPI: Amanda [name] is a 63 y.o. female with a PMH below who presents today for annual followup of her heart palpitations and hypertension/hyperlipidemia. She was formerly seen by Dr. Rollene Fare. I last saw her in June 2015. She's had an essentially negative cardiac evaluation with echocardiogram and stress test in the past.  Past Medical History  Diagnosis Date  . Celiac disease     Gluten free diet  . Elevated cholesterol   . Restless leg syndrome   . Myalgia and myositis, unspecified   . Osteoarthrosis, unspecified whether generalized or localized, unspecified site   . Insomnia   . Other abnormal blood chemistry   . Other symptoms involving cardiovascular system   . Osteopenia 04/2013    T score -2.0 FRAX 9.2%/1.1%  . Hiatal hernia   . Reflux   . STD (sexually transmitted disease)     HSV  . GERD (gastroesophageal reflux disease)   . Thyroid cancer   . Change in voice   . Bruises easily     Prior Cardiac Evaluation and Past Surgical History: Past Surgical History  Procedure Laterality Date  . Knee surgery  2002    both knees  . Thyroid surgery      Partial thyroidectomy  . Abdominal hysterectomy  1986    TAH  . Breast surgery  1999    Reduction  . Brain surgery  2010    hemorrhage  . Nm myoview ltd  November 2012    No ischemia or infarction  . Transthoracic echocardiogram   April 2013; 01/18/2013    Grade 1 diastolic dysfunction; normal EF 60-65%. No significant valve disease  . Colonoscopy    . Esophagogastroduodenoscopy      Interval History: she presents today doing relatively well overall from a cardiac standpoint. His lungs she stays on the Toprol, her  palpitations are controlled. She is not had any syncope or near syncope, TIA or CVA symptoms. No chest tightness pressure or dyspnea at rest or exertion. No PND, orthopnea or edema.  The one thing she does note is that she's been having some issues with memory loss and is concerned there may be a medication effect. She asked about this and the metoprolol but also on nodules that could be related to her statin.  ROS: A comprehensive was performed. Review of Systems  Constitutional: Negative for malaise/fatigue.  Respiratory: Negative for cough, shortness of breath and wheezing.   Cardiovascular: Negative.        Per history of present illness  Gastrointestinal: Negative for blood in stool and melena.  Genitourinary: Negative for hematuria.  Musculoskeletal: Negative.   Neurological: Negative for dizziness, sensory change, speech change, focal weakness, seizures, loss of consciousness and headaches.  Endo/Heme/Allergies: Does not bruise/bleed easily.  Psychiatric/Behavioral: Positive for memory loss. Negative for depression and hallucinations. The patient is not nervous/anxious and does not have insomnia.   All other systems reviewed and are negative.   Current Outpatient Prescriptions on File Prior to Visit  Medication Sig Dispense Refill  . APPLE CIDER VINEGAR PO Take 4 oz by mouth 2 (two) times daily.    [name] aspirin  EC 81 MG EC tablet Take 1 tablet (81 mg total) by mouth daily.    [name] atorvastatin (LIPITOR) 20 MG tablet Take 10 mg by mouth every evening.    [name] CALCIUM-VITAMIN D PO Take 2 tablets by mouth daily.    . clonazePAM (KLONOPIN) 0.5 MG tablet Take 0.5 mg by mouth at bedtime.    . cycloSPORINE (RESTASIS) 0.05 % ophthalmic emulsion Place 1 drop into both eyes 2 (two) times daily.    . metoprolol succinate (TOPROL-XL) 25 MG 24 hr tablet Take 1 tablet (25 mg total) by mouth every evening. 30 tablet 8  . Multiple Vitamin (MULTIVITAMIN) tablet Take 1 tablet by mouth daily.      [name]  PARoxetine (PAXIL) 10 MG tablet Take 10 mg by mouth every morning.    [name] Propylene Glycol (SYSTANE BALANCE) 0.6 % SOLN Apply 1 drop to eye 2 (two) times daily. For dry eyes, takes along with restasis    . rOPINIRole (REQUIP) 0.25 MG tablet Take 0.25 mg by mouth at bedtime.    [name] VITAMIN D, CHOLECALCIFEROL, PO Take 2 tablets by mouth daily.     No current facility-administered medications on file prior to visit.    ALLERGIES REVIEWED IN EPIC -- no change SOCIAL AND FAMILY HISTORY REVIEWED IN EPIC -- none change  Wt Readings from Last 3 Encounters:  04/21/14 162 lb (73.483 kg)  02/24/14 159 lb (72.122 kg)  04/16/13 159 lb (72.122 kg)    PHYSICAL EXAM BP 150/80 mmHg  Pulse 70  Ht 5' 5.5" (1.664 m)  Wt 162 lb (73.483 kg)  BMI 26.54 kg/m2 General appearance: Alert very x3, answers questions appropriately. Well-nourished and well-groomed. Normal mood and affect.[name] HEENT: [place]/AT, EOMI, MMM, anicteric sclera Neck: no adenopathy, no carotid bruit, no JVD, supple, symmetrical, trachea midline and thyroid not enlarged, symmetric, no tenderness/mass/nodules Lungs: clear to auscultation bilaterally, normal percussion bilaterally and nonlabored, good movement Heart: normal apical impulse, regularly irregular rhythm, S1, mild splitting of S2 normal and soft 1-2/6 HSM at the base. Does not radiate. I do not hear an S4 gallop. No rubs Abdomen: soft, non-tender; bowel sounds normal; no masses, no organomegaly Extremities: extremities normal, atraumatic, no cyanosis or edema and no ulcers, gangrene or trophic changes Pulses: 2+ and symmetric Neurologic: Grossly normal   Adult ECG Report  Rate: 70 ;  Rhythm: normal sinus rhythm and Possible left atrial enlargement.  Narrative Interpretation: otherwise normal EKG with normal intervals, axis and voltage  Recent Labs:  None recorded in > 1 year   ASSESSMENT / PLAN: Heart palpitations Well controlled on Toprol - I doubt that Toprol is having an effect  on her memory.  Hypercholesteremia Her lipids were well controlled last year but last check. She has been followed by her PCP.   There is a concern of possible reaction between  Statins and memory loss. Simply because this is becoming an issue for her I think we can give it a trial. Her lipids were relatively well controlled so we do have room to maneuver. Plan will be to have her stop Lipitor starting in January. After one month, she will start Zetia 10 mg. We'll then check a lipid panel 3 months later to evaluate for any potential exacerbation of her cholesterol levels.  We can also then reassess her symptoms.. If her memory issues have improved by then, then we would probably want to consider backing off for voiding statins altogether. If her lipids are not as well-controlled as expected but  her memory is better than it did do once or twice a week statin plus Zetia.  Chest pain, atypical She intermittently has discomfort in her chest is never really associated with exertion or dyspnea. Just intermittently has twinges in her chest. Probably when she feels her palpitations.  Memory loss See above. We will do a trial run to see if there is a statin involvement factor.    Orders Placed This Encounter  Procedures  . EKG 12-Lead   Meds ordered this encounter  Medications  . ezetimibe (ZETIA) 10 MG tablet    Sig: Take 1 tablet (10 mg total) by mouth daily.    Dispense:  30 tablet    Refill:  11     Followup: one year   Tashika Goodin W, M.D., M.S. Interventional Cardiologist   Pager # [phone]

## 2014-04-21 NOTE — Assessment & Plan Note (Signed)
Her lipids were well controlled last year but last check. She has been followed by her PCP.   There is a concern of possible reaction between  Statins and memory loss. Simply because this is becoming an issue for her I think we can give it a trial. Her lipids were relatively well controlled so we do have room to maneuver. Plan will be to have her stop Lipitor starting in January. After one month, she will start Zetia 10 mg. We'll then check a lipid panel 3 months later to evaluate for any potential exacerbation of her cholesterol levels.  We can also then reassess her symptoms.. If her memory issues have improved by then, then we would probably want to consider backing off for voiding statins altogether. If her lipids are not as well-controlled as expected but her memory is better than it did do once or twice a week statin plus Zetia.

## 2014-04-24 ENCOUNTER — Encounter: Payer: Self-pay | Admitting: Cardiovascular Disease

## 2014-05-13 ENCOUNTER — Ambulatory Visit: Payer: BC Managed Care – PPO | Admitting: Internal Medicine

## 2014-05-23 ENCOUNTER — Other Ambulatory Visit: Payer: Self-pay | Admitting: Otolaryngology

## 2014-05-23 DIAGNOSIS — Z85850 Personal history of malignant neoplasm of thyroid: Secondary | ICD-10-CM

## 2014-06-04 ENCOUNTER — Ambulatory Visit
Admission: RE | Admit: 2014-06-04 | Discharge: 2014-06-04 | Disposition: A | Discharge status: Home / Self Care | Payer: BLUE CROSS/BLUE SHIELD | Source: Ambulatory Visit | Attending: Otolaryngology | Admitting: Otolaryngology

## 2014-06-04 DIAGNOSIS — Z85850 Personal history of malignant neoplasm of thyroid: Secondary | ICD-10-CM

## 2014-06-11 ENCOUNTER — Other Ambulatory Visit: Payer: Self-pay | Admitting: Cardiovascular Disease

## 2014-06-11 NOTE — Telephone Encounter (Signed)
Refilled. E-sent. Dr. Ellyn Hack saw pt last on 04/21/14.

## 2014-06-13 ENCOUNTER — Encounter: Payer: Self-pay | Admitting: Gynecology

## 2014-06-13 ENCOUNTER — Ambulatory Visit (INDEPENDENT_AMBULATORY_CARE_PROVIDER_SITE_OTHER): Payer: BLUE CROSS/BLUE SHIELD | Admitting: Gynecology

## 2014-06-13 DIAGNOSIS — N939 Abnormal uterine and vaginal bleeding, unspecified: Secondary | ICD-10-CM

## 2014-06-13 LAB — URINALYSIS W MICROSCOPIC + REFLEX CULTURE
Bilirubin Urine: NEGATIVE
GLUCOSE, UA: NEGATIVE mg/dL
Hgb urine dipstick: NEGATIVE
KETONES UR: NEGATIVE mg/dL
Leukocytes, UA: NEGATIVE
Nitrite: NEGATIVE
PH: 6.5 (ref 5.0–8.0)
Protein, ur: NEGATIVE mg/dL
Specific Gravity, Urine: 1.015 (ref 1.005–1.030)
Urobilinogen, UA: 0.2 mg/dL (ref 0.0–1.0)

## 2014-06-13 NOTE — Progress Notes (Signed)
Amanda [name] 08/25/1950 [id number]        63 y.o.  G2P2002 presents having gone to the bathroom today to urinate and noticing bright red blood. When she wiped with her toilet paper she again saw bright red blood which seems to be resolving.  Not having any abdominal/pelvic pain, frequency, dysuria, urgency, low back pain.  No GI symptoms of diarrhea constipation. Status post hysterectomy in the past. No prior history of the same.  Pap smear 02/2014 of the vaginal cuff negative.  Past medical history,surgical history, problem list, medications, allergies, family history and social history were all reviewed and documented in the EPIC chart.  Directed ROS with pertinent positives and negatives documented in the history of present illness/assessment and plan.  Exam: Kim assistant General appearance:  Normal Abdomen soft nontender without masses guarding rebound Pelvic external BUS vagina without overt evidence of bleeding either on the perineum, introitus or vaginally. Vaginal cuff probed with a swab without evidence of blood. Mild urethral prolapse but no evidence of bleeding. Bimanual without masses or tenderness rectal exam normal.   Patient collected urinalysis shows TNTC RBC with large amount of blood on dipstick. 0-2 WBC few bacteria and few epithelial cells.  Urinary catheterization performed, sterile technique with clear yellow urine. Subsequent urinalysis was negative for blood.  Assessment/Plan:  64 y.o. [id number] With history as above. No overt etiology of her bleeding. Possible hemorrhagic cystitis although urine again is clear now. Anatomic such as urethral source bladder or kidney also reviewed with the patient up to including tumor. Will culture urine and treat if shows infection but will hold on antibiotic for now. We'll have urology see her regardless to decide whether any further testing needed and will make this appointment for her and she knows importance of follow up and will  call the office if she does not hear from [place] within 1 week.     [name], 3:39 PM 06/13/2014

## 2014-06-13 NOTE — Patient Instructions (Signed)
Office will call you to arrange urology appointment.

## 2014-06-13 NOTE — Addendum Note (Signed)
Addended by: Nelva Nay on: 06/13/2014 03:46 PM   Modules accepted: Orders

## 2014-06-15 LAB — URINE CULTURE
COLONY COUNT: NO GROWTH
ORGANISM ID, BACTERIA: NO GROWTH

## 2014-06-16 ENCOUNTER — Telehealth: Payer: Self-pay | Admitting: *Deleted

## 2014-06-16 NOTE — Telephone Encounter (Signed)
Referral placed at [facility] they will fax with time and date to relay to patient.

## 2014-06-16 NOTE — Telephone Encounter (Signed)
-----   Message from Anastasio Auerbach, MD sent at 06/13/2014  3:42 PM EST ----- Schedule urology appointment reference episode of bright red painless hematuria. Status post hysterectomy in the past.

## 2014-06-18 ENCOUNTER — Telehealth: Payer: Self-pay | Admitting: *Deleted

## 2014-06-18 NOTE — Telephone Encounter (Signed)
Pt scheduled for 07/22/14 @ 2:30pm with Dr.Eskridge pt aware of this because [facility] called [facility] herself.

## 2014-06-18 NOTE — Telephone Encounter (Signed)
Pt informed with the below note. 

## 2014-06-18 NOTE — Telephone Encounter (Signed)
Pt called to follow up from [facility] 06/13/14 c/o lower back discomfort, no energy  has not noticed any more blood with urination or wiping. Leaving for cruise on Saturday, referral faxed to [facility], pt asked if you have any recommendations about symptom? Please advise

## 2014-06-18 NOTE — Telephone Encounter (Signed)
See her primary physician

## 2014-07-16 ENCOUNTER — Ambulatory Visit: Payer: BLUE CROSS/BLUE SHIELD | Admitting: Cardiology

## 2014-08-01 ENCOUNTER — Encounter (HOSPITAL_COMMUNITY): Payer: Self-pay | Admitting: Emergency Medicine

## 2014-08-01 ENCOUNTER — Emergency Department (HOSPITAL_COMMUNITY): Payer: BLUE CROSS/BLUE SHIELD

## 2014-08-01 ENCOUNTER — Emergency Department (HOSPITAL_COMMUNITY)
Admission: EM | Admit: 2014-08-01 | Discharge: 2014-08-01 | Disposition: A | Discharge status: Home / Self Care | Payer: BLUE CROSS/BLUE SHIELD | Attending: Emergency Medicine | Admitting: Emergency Medicine

## 2014-08-01 DIAGNOSIS — M8580 Other specified disorders of bone density and structure, unspecified site: Secondary | ICD-10-CM | POA: Diagnosis not present

## 2014-08-01 DIAGNOSIS — E780 Pure hypercholesterolemia: Secondary | ICD-10-CM | POA: Insufficient documentation

## 2014-08-01 DIAGNOSIS — Z8719 Personal history of other diseases of the digestive system: Secondary | ICD-10-CM | POA: Diagnosis not present

## 2014-08-01 DIAGNOSIS — G4700 Insomnia, unspecified: Secondary | ICD-10-CM | POA: Insufficient documentation

## 2014-08-01 DIAGNOSIS — Z7982 Long term (current) use of aspirin: Secondary | ICD-10-CM | POA: Diagnosis not present

## 2014-08-01 DIAGNOSIS — Y9389 Activity, other specified: Secondary | ICD-10-CM | POA: Insufficient documentation

## 2014-08-01 DIAGNOSIS — M1990 Unspecified osteoarthritis, unspecified site: Secondary | ICD-10-CM | POA: Insufficient documentation

## 2014-08-01 DIAGNOSIS — Z85850 Personal history of malignant neoplasm of thyroid: Secondary | ICD-10-CM | POA: Insufficient documentation

## 2014-08-01 DIAGNOSIS — Z8619 Personal history of other infectious and parasitic diseases: Secondary | ICD-10-CM | POA: Insufficient documentation

## 2014-08-01 DIAGNOSIS — S82091A Other fracture of right patella, initial encounter for closed fracture: Secondary | ICD-10-CM | POA: Insufficient documentation

## 2014-08-01 DIAGNOSIS — Z79899 Other long term (current) drug therapy: Secondary | ICD-10-CM | POA: Diagnosis not present

## 2014-08-01 DIAGNOSIS — W108XXA Fall (on) (from) other stairs and steps, initial encounter: Secondary | ICD-10-CM | POA: Diagnosis not present

## 2014-08-01 DIAGNOSIS — Z87891 Personal history of nicotine dependence: Secondary | ICD-10-CM | POA: Insufficient documentation

## 2014-08-01 DIAGNOSIS — Y998 Other external cause status: Secondary | ICD-10-CM | POA: Diagnosis not present

## 2014-08-01 DIAGNOSIS — Y9289 Other specified places as the place of occurrence of the external cause: Secondary | ICD-10-CM | POA: Insufficient documentation

## 2014-08-01 DIAGNOSIS — G2581 Restless legs syndrome: Secondary | ICD-10-CM | POA: Diagnosis not present

## 2014-08-01 DIAGNOSIS — S8991XA Unspecified injury of right lower leg, initial encounter: Secondary | ICD-10-CM | POA: Diagnosis present

## 2014-08-01 DIAGNOSIS — S82009A Unspecified fracture of unspecified patella, initial encounter for closed fracture: Secondary | ICD-10-CM

## 2014-08-01 MED ORDER — HYDROCODONE-ACETAMINOPHEN 5-325 MG PO TABS: 2.0000 | ORAL_TABLET | ORAL | Status: DC | PRN

## 2014-08-01 MED ORDER — HYDROCODONE-ACETAMINOPHEN 5-325 MG PO TABS
1.0000 | ORAL_TABLET | Freq: Once | ORAL | Status: AC
  Administered 2014-08-01: 1 via ORAL
  Filled 2014-08-01: qty 1

## 2014-08-01 NOTE — ED Notes (Signed)
Ortho called

## 2014-08-01 NOTE — ED Provider Notes (Signed)
CSN: [id number]     Arrival date & time 08/01/14  3343 History   First MD Initiated Contact with Patient 08/01/14 [phone]     Chief Complaint  Patient presents with  . Fall  . Knee Pain     HPI  Patient presents for evaluation of right knee pain. She states that she put pillows on her stairs to keep her dog from going up and down her stairs. She was coming down her stairs yesterday. She moved her left foot to move for the pillows. She stepped down with her right foot. When she did her left foot slipped. She caught her right toe on the stairs and fell and hyper flexion of her knee has her weight went on down the stairs. Her leg then twisted outward before extending. She complains of pain in the knee, and some swelling.. Painful ambulation this morning. Slept with the neoprene sleeve on her knee last night.  Has osteoporosis of both knees. Spells with Dr. Posey Pronto. Has had injection's in the both knees in the past.  Past Medical History  Diagnosis Date  . Celiac disease     Gluten free diet  . Elevated cholesterol   . Restless leg syndrome   . Myalgia and myositis, unspecified   . Osteoarthrosis, unspecified whether generalized or localized, unspecified site   . Insomnia   . Other abnormal blood chemistry   . Other symptoms involving cardiovascular system   . Osteopenia 04/2013    T score -2.0 FRAX 9.2%/1.1%  . Hiatal hernia   . Reflux   . STD (sexually transmitted disease)     HSV  . GERD (gastroesophageal reflux disease)   . Thyroid cancer   . Change in voice   . Bruises easily    Past Surgical History  Procedure Laterality Date  . Knee surgery  2002    both knees  . Thyroid surgery      Partial thyroidectomy  . Abdominal hysterectomy  1986    TAH  . Breast surgery  1999    Reduction  . Brain surgery  2010    hemorrhage  . Nm myoview ltd  November 2012    No ischemia or infarction  . Transthoracic echocardiogram   April 2013; 01/18/2013    Grade 1 diastolic  dysfunction; normal EF 60-65%. No significant valve disease  . Colonoscopy    . Esophagogastroduodenoscopy     Family History  Problem Relation Age of Onset  . Diabetes Maternal Aunt   . Breast cancer Maternal Aunt     Maternal Great aunt-Age 26's  . Heart disease Maternal Grandfather   . Hyperlipidemia Maternal Grandfather   . Hypertension Paternal Grandfather   . Liver cancer Father   . Colon cancer Neg Hx    History  Substance Use Topics  . Smoking status: Former Research scientist (life sciences)  . Smokeless tobacco: Never Used  . Alcohol Use: 0.5 oz/week    1 drink(s) per week   OB History    Gravida Para Term Preterm AB TAB SAB Ectopic Multiple Living   2 2 2       2      Review of Systems  Constitutional: Negative for fever, chills, diaphoresis, appetite change and fatigue.  HENT: Negative for mouth sores, sore throat and trouble swallowing.   Eyes: Negative for visual disturbance.  Respiratory: Negative for cough, chest tightness, shortness of breath and wheezing.   Cardiovascular: Negative for chest pain.  Gastrointestinal: Negative for nausea, vomiting, abdominal pain, diarrhea and  abdominal distention.  Endocrine: Negative for polydipsia, polyphagia and polyuria.  Genitourinary: Negative for dysuria, frequency and hematuria.  Musculoskeletal: Negative for gait problem.       Pain and swelling of the right knee. Painful ambulation.  Skin: Negative for color change, pallor and rash.  Neurological: Negative for dizziness, syncope, light-headedness and headaches.  Hematological: Does not bruise/bleed easily.  Psychiatric/Behavioral: Negative for behavioral problems and confusion.      Allergies  Ambien; Cortisone; Eszopiclone and related; Levofloxacin; Dicloxacillin; Dilantin; Erythromycin; Avelox; Nitrofuran derivatives; Phenytoin sodium extended; and Trazodone and nefazodone  Home Medications   Prior to Admission medications   Medication Sig Start Date End Date Taking? Authorizing  Provider  APPLE CIDER VINEGAR PO Take 4 oz by mouth 2 (two) times daily.   Yes Historical Provider, MD  aspirin EC 81 MG EC tablet Take 1 tablet (81 mg total) by mouth daily. 01/18/13  Yes Kinnie Feil, MD  CALCIUM-VITAMIN D PO Take 2 tablets by mouth daily.   Yes Historical Provider, MD  clonazePAM (KLONOPIN) 1 MG tablet Take 0.5 mg by mouth at bedtime.   Yes Historical Provider, MD  cycloSPORINE (RESTASIS) 0.05 % ophthalmic emulsion Place 1 drop into both eyes 2 (two) times daily.   Yes Historical Provider, MD  escitalopram (LEXAPRO) 10 MG tablet Take 10 mg by mouth daily. 07/07/14  Yes Historical Provider, MD  ezetimibe (ZETIA) 10 MG tablet Take 1 tablet (10 mg total) by mouth daily. 04/21/14  Yes Leonie Man, MD  metoprolol succinate (TOPROL-XL) 25 MG 24 hr tablet TAKE 1 TABLET BY MOUTH EVERY EVENING 06/11/14  Yes Leonie Man, MD  Multiple Vitamin (MULTIVITAMIN) tablet Take 1 tablet by mouth daily.     Yes Historical Provider, MD  Propylene Glycol (SYSTANE BALANCE) 0.6 % SOLN Apply 1 drop to eye 2 (two) times daily. For dry eyes, takes along with restasis   Yes Historical Provider, MD  rOPINIRole (REQUIP) 0.25 MG tablet Take 0.25 mg by mouth at bedtime.   Yes Historical Provider, MD  VITAMIN A PO Take 1 tablet by mouth daily.   Yes Historical Provider, MD  VITAMIN D, CHOLECALCIFEROL, PO Take 1 tablet by mouth daily.    Yes Historical Provider, MD   BP 153/94 mmHg  Pulse 77  Temp(Src) 98.2 F (36.8 C) (Oral)  Resp 18  SpO2 97% Physical Exam  Constitutional: She is oriented to person, place, and time. She appears well-developed and well-nourished. No distress.  HENT:  Head: Normocephalic.  Eyes: Conjunctivae are normal. Pupils are equal, round, and reactive to light. No scleral icterus.  Neck: Normal range of motion. Neck supple. No thyromegaly present.  Cardiovascular: Normal rate and regular rhythm.  Exam reveals no gallop and no friction rub.   No murmur  heard. Pulmonary/Chest: Effort normal and breath sounds normal. No respiratory distress. She has no wheezes. She has no rales.  Abdominal: Soft. Bowel sounds are normal. She exhibits no distension. There is no tenderness. There is no rebound.  Musculoskeletal: Normal range of motion.  Tender anteriorly over the knee. Particularly over the patella. She had the neoprene sleeve on just prior to my evaluation of the help to remove it. She does not have an appreciable joint effusion on exam. She does not have distention of her suprapatellar bursa. She has tenderness along the lateral joint line more so than medially.  Neurological: She is alert and oriented to person, place, and time.  Skin: Skin is warm and dry. No rash  noted.  Psychiatric: She has a normal mood and affect. Her behavior is normal.    ED Course  Procedures (including critical care time) Labs Review Labs Reviewed - No data to display  Imaging Review Ct Knee Right Wo Contrast  08/01/2014   CLINICAL DATA:  Right knee pain after falling down stairs yesterday. Patellar fracture.  EXAM: CT OF THE RIGHT KNEE WITHOUT CONTRAST  TECHNIQUE: Multidetector CT imaging of the right knee was performed according to the standard protocol. Multiplanar CT image reconstructions were also generated.  COMPARISON:  Radiographs dated 08/01/2014  FINDINGS: There is a nondisplaced hairline fracture of the right inferior lateral aspect of the patella. The fragment is approximately 18 x 5 mm.  Small joint effusion. No other fractures. Severe arthritic changes of the patellofemoral compartment primarily in the lateral aspect.  Minimal marginal osteophytes in the medial and lateral compartments. Cruciate and collateral ligaments are intact.  IMPRESSION: Nondisplaced hairline fracture of the right inferior lateral aspect of the patella.  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   Electronically Signed   By: Lorriane Shire M.D.   On: 08/01/2014 12:21    Dg Knee Complete 4 Views Right  08/01/2014   CLINICAL DATA:  Right knee pain since a fall down stairs on 07/31/2014.  EXAM: RIGHT KNEE - COMPLETE 4+ VIEW  COMPARISON:  None.  FINDINGS: There is a hairline fracture through the lateral aspect of the patella seen only on one oblique view. There is a small joint effusion. There are small tricompartmental marginal osteophytes, most prominent in the patellofemoral compartment.  IMPRESSION: Incompletely visualized fracture of the patella. Small joint effusion.   Electronically Signed   By: Lorriane Shire M.D.   On: 08/01/2014 10:42     EKG Interpretation None      MDM   Final diagnoses:  Patella fracture    X-ray incompletely shows inferior lateral right patellar fracture. CT scan shows nondisplaced inferolateral right patellar fracture with joint effusion. I discussed the findings with Dr. Alvan Dame. Plan is immobilizer, nonweightbearing for the first few days. The pain improves she can become toe-touch followed by increasing weightbearing. Follow-up with Dr. Alvan Dame in the next 7-10 days.    Tanna Furry, MD 08/01/14 1321

## 2014-08-01 NOTE — ED Notes (Signed)
Pt from home c/o right knee pain from falling down stairs yesterday. NO LOC and did not hit head.  No blood thinners. She reports previous injury to same knee. (no cartilage)

## 2014-08-01 NOTE — Progress Notes (Signed)
Confirmed with pt she has crutches (DME) to use at home until able to bear weight

## 2014-08-01 NOTE — ED Notes (Signed)
Patient transported to CT 

## 2014-08-01 NOTE — Discharge Instructions (Signed)
Immobilizer when walking  As your pain improves you may weight bear as tolerated using the immobilizer and crutches. Follow-up with Dr. Alvan Dame.  Patellar Dislocation A patellar dislocation occurs when your kneecap (patella) slips out of its normal position in a groove in front of the lower end of your thighbone (femur). This groove is called the patellofemoral groove.  CAUSES The kneecap is normally positioned over the front of the knee joint at the base of the thighbone. A kneecap can be dislocated when:  The kneecap is out of place (patellar tracking disorder), and force is applied.  The foot is firmly planted pointing outward, and the knee bends with the thigh turned inward. This kind of injury is common during many sports activities.  The inner edge of the kneecap is hit, pushing it toward the outer side of the leg. SIGNS AND SYMPTOMS  Severe pain.  A misshapen knee that looks like a bone is out of position.  A popping sensation, followed by a feeling that something is out of place.  Inability to bend or straighten the knee.  Knee swelling.  Cool, pale skin or numbness and tingling in or below the affected knee. DIAGNOSIS  Your health care provider will physically examine the injured area. An X-ray exam may be done to make sure a bone fracture has not occurred. In some cases, your health care provider may look inside your knee joint with an instrument much like a pencil-sized telescope (arthroscope). This may be done to make sure you have no loose cartilage in your joint. Loose cartilage is not visible on an X-ray image. TREATMENT  In many instances, the patella can be guided back into position without much difficulty. It often goes back into position by straightening the leg. Often, nothing more may be needed other than a brief period of immobilization followed by the exercises your health care provider recommends. If patellar dislocation starts to become frequent after the first  incident, surgery may be needed to prevent your patella from slipping out of place. HOME CARE INSTRUCTIONS   Only take over-the-counter or prescription medicines for pain, discomfort, or fever as directed by your health care provider.  Use a knee brace if directed to do so by your health care provider.  Use crutches as instructed.  Apply ice to the injured knee:  Put ice in a plastic bag.  Place a towel between your skin and the bag.  Leave the ice on for 20 minutes, 2-3 times a day.  Follow your health care provider's instructions for doing any recommended range-of-motion exercises or other exercises. SEEK IMMEDIATE MEDICAL CARE IF:  You have increased pain or swelling in the knee that is not relieved with medicine.  You have increasing inflammation in the knee.  You have locking or catching of your knee. MAKE SURE YOU:  Understand these instructions.  Will watch your condition.  Will get help right away if you are not doing well or get worse. Document Released: 01/18/2001 Document Revised: 02/13/2013 Document Reviewed: 12/05/2012 [facility] Patient Information 2015 [place], [place]. This information is not intended to replace advice given to you by your health care provider. Make sure you discuss any questions you have with your health care provider.

## 2014-08-25 ENCOUNTER — Ambulatory Visit (INDEPENDENT_AMBULATORY_CARE_PROVIDER_SITE_OTHER): Payer: BLUE CROSS/BLUE SHIELD | Admitting: Cardiology

## 2014-08-25 ENCOUNTER — Encounter: Payer: Self-pay | Admitting: Cardiology

## 2014-08-25 VITALS — BP 130/81 | HR 76 | Ht 65.0 in | Wt 161.1 lb

## 2014-08-25 DIAGNOSIS — R002 Palpitations: Secondary | ICD-10-CM | POA: Diagnosis not present

## 2014-08-25 DIAGNOSIS — E785 Hyperlipidemia, unspecified: Secondary | ICD-10-CM

## 2014-08-25 NOTE — Patient Instructions (Signed)
May try 1/2 tablet of metoprolol to see how you feel , if needed you may take 1/2 tablet more palp. Occur.  No other changes with medications  Your physician wants you to follow-up in 12 month Dr Ellyn Hack.  You will receive a reminder letter in the mail two months in advance. If you don't receive a letter, please call our office to schedule the follow-up appointment.

## 2014-08-25 NOTE — Progress Notes (Signed)
PCP: Amanda Medal, MD  Clinic Note: Chief Complaint  Patient presents with  . Follow-up    palpitations, fatigue and heart skipping beats. no chest pains or leg pain or swelling  . Palpitations    HPI: Amanda [name] is a 64 y.o. female with a PMH below who presents today for 4 month f/u for palpitations & HTN/HLD.  Former patient of Dr. Rollene Fare. Last seen 04/2014. She's had an essentially negative cardiac evaluation with echocardiogram and stress test in the past. We changed from statin to Zetia - for memory issues.  Past Medical History  Diagnosis Date  . Celiac disease     Gluten free diet  . Elevated cholesterol   . Restless leg syndrome   . Myalgia and myositis, unspecified   . Osteoarthrosis, unspecified whether generalized or localized, unspecified site   . Insomnia   . Other abnormal blood chemistry   . Other symptoms involving cardiovascular system   . Osteopenia 04/2013    T score -2.0 FRAX 9.2%/1.1%  . Hiatal hernia   . Reflux   . STD (sexually transmitted disease)     HSV  . GERD (gastroesophageal reflux disease)   . Thyroid cancer   . Change in voice   . Bruises easily     Prior Cardiac Evaluation and Past Surgical History: Past Surgical History  Procedure Laterality Date  . Knee surgery  2002    both knees  . Thyroid surgery      Partial thyroidectomy  . Abdominal hysterectomy  1986    TAH  . Breast surgery  1999    Reduction  . Brain surgery  2010    hemorrhage  . Nm myoview ltd  November 2012    No ischemia or infarction  . Transthoracic echocardiogram   April 2013; 01/18/2013    Grade 1 diastolic dysfunction; normal EF 60-65%. No significant valve disease  . Colonoscopy    . Esophagogastroduodenoscopy      Interval History: No new symptoms since December besides one episode of ~1 week with lots of palpitations despite taking Metoprolol.  Short episodes lasting a few seconds - not prolonged, just recurrent.  Cardiovascular ROS:  positive for - palpitations negative for - chest pain, dyspnea on exertion, edema, loss of consciousness, orthopnea, paroxysmal nocturnal dyspnea, rapid heart rate, shortness of breath and  syncope/near syncope, No TIA/amaurosis fugax symptoms   ROS: A comprehensive was performed. Review of Systems  Constitutional: Negative for malaise/fatigue.  HENT: Negative for nosebleeds.   Respiratory: Negative for cough, shortness of breath and wheezing.   Cardiovascular: Positive for palpitations. Negative for claudication.  Gastrointestinal: Negative for blood in stool and melena.  Genitourinary: Negative for hematuria.  Musculoskeletal: Negative for myalgias.  Endo/Heme/Allergies: Does not bruise/bleed easily.  Psychiatric/Behavioral: Positive for memory loss (Much less since being off of Statin).  All other systems reviewed and are negative.   Current Outpatient Prescriptions on File Prior to Visit  Medication Sig Dispense Refill  . APPLE CIDER VINEGAR PO Take 4 oz by mouth 2 (two) times daily.    [name] aspirin EC 81 MG EC tablet Take 1 tablet (81 mg total) by mouth daily.    [name] CALCIUM-VITAMIN D PO Take 2 tablets by mouth daily.    . clonazePAM (KLONOPIN) 1 MG tablet Take 0.5 mg by mouth at bedtime.    . cycloSPORINE (RESTASIS) 0.05 % ophthalmic emulsion Place 1 drop into both eyes 2 (two) times daily.    [name] escitalopram (LEXAPRO) 10 MG  tablet Take 10 mg by mouth daily.  11  . ezetimibe (ZETIA) 10 MG tablet Take 1 tablet (10 mg total) by mouth daily. 30 tablet 11  . metoprolol succinate (TOPROL-XL) 25 MG 24 hr tablet TAKE 1 TABLET BY MOUTH EVERY EVENING 30 tablet 8  . Multiple Vitamin (MULTIVITAMIN) tablet Take 1 tablet by mouth daily.      [name] Propylene Glycol (SYSTANE BALANCE) 0.6 % SOLN Apply 1 drop to eye 2 (two) times daily. For dry eyes, takes along with restasis    . rOPINIRole (REQUIP) 0.25 MG tablet Take 0.25 mg by mouth at bedtime.    [name] VITAMIN A PO Take 1 tablet by mouth daily.    [name]  VITAMIN D, CHOLECALCIFEROL, PO Take 1 tablet by mouth daily.      No current facility-administered medications on file prior to visit.   Allergies  Allergen Reactions  . Ambien [Zolpidem Tartrate] Other (See Comments)    Causes Neurological problems with very bad dizziness, pains, disorientation  . Cortisone Other (See Comments)    REACTION: "mania" post ESI  . Eszopiclone And Related Other (See Comments)    Causes Neurological problems with very bad dizziness, pains, disorientation  . Levofloxacin Other (See Comments)    Muscle spasms and jerking movements  . Dicloxacillin Other (See Comments)    Stomach problems, swelling of tongue  . Dilantin [Phenytoin Sodium Extended] Hives and Itching  . Erythromycin Other (See Comments)    Tears stomach up  . Avelox [Moxifloxacin Hcl In Nacl]   . Nitrofuran Derivatives Other (See Comments)    unknown  . Phenytoin Sodium Extended Rash  . Trazodone And Nefazodone Other (See Comments)    unknown     History  Substance Use Topics  . Smoking status: Former Research scientist (life sciences)  . Smokeless tobacco: Never Used  . Alcohol Use: 0.5 oz/week    1 drink(s) per week   Family History  Problem Relation Age of Onset  . Diabetes Maternal Aunt   . Breast cancer Maternal Aunt     Maternal Great aunt-Age 25's  . Heart disease Maternal Grandfather   . Hyperlipidemia Maternal Grandfather   . Hypertension Paternal Grandfather   . Liver cancer Father   . Colon cancer Neg Hx     Wt Readings from Last 3 Encounters:  08/25/14 161 lb 1.6 oz (73.074 kg)  04/21/14 162 lb (73.483 kg)  02/24/14 159 lb (72.122 kg)    PHYSICAL EXAM BP 130/81 mmHg  Pulse 76  Ht 5' 5"  (1.651 m)  Wt 161 lb 1.6 oz (73.074 kg)  BMI 26.81 kg/m2 General appearance: Alert very x3, answers questions appropriately. Well-nourished and well-groomed. Normal mood and affect.[name] HEENT: [place]/AT, EOMI, MMM, anicteric sclera Neck: no adenopathy, no carotid bruit, no JVD, supple, symmetrical, trachea  midline and thyroid not enlarged, symmetric, no tenderness/mass/nodules Lungs: clear to auscultation bilaterally, normal percussion bilaterally and nonlabored, good movement Heart: normal apical impulse, regularly irregular rhythm, S1, mild splitting of S2 normal and soft 1-2/6 HSM at the base. Does not radiate. I do not hear an S4 gallop. No rubs Abdomen: soft, non-tender; bowel sounds normal; no masses, no organomegaly Extremities: extremities normal, atraumatic, no cyanosis or edema and no ulcers, gangrene or trophic changes Pulses: 2+ and symmetric Neurologic: Grossly normal    Adult ECG Report  Rate: 76 ;  Rhythm: normal sinus rhythm  Narrative Interpretation: normal EKG  Recent Labs:  PCP just checked.   ASSESSMENT / PLAN: Problem List Items Addressed This  Visit    Heart palpitations - Primary (Chronic)    Notably improved and for that she only has very rare, fleeting episodes. She would like to see if she could potentially back abnormal on the Toprol. It is acceptable to go to 1/2 tablet, however she can also use an additional half tablet if needed for rapid/irregular heartbeats. Keep [place] informed of her symptoms. For now does not sound like a true arrhythmia..      Relevant Orders   EKG 12-Lead (Completed)   Hyperlipidemia with target LDL less than 130 (Chronic)     On Zetia. Memory issues are much improved. Is due for recheck lipid panel 2 see what her lipids look like being off statin.       Other Visit Diagnoses    Hyperlipidemia LDL goal <70        Relevant Orders    EKG 12-Lead (Completed)       Orders Placed This Encounter  Procedures  . EKG 12-Lead   No orders of the defined types were placed in this encounter.   Ok to cut Toprol in 1/2 daily; can also take additional 1/2 - 1 tab PRN  Followup: 1 yr    HARDING, Leonie Green, M.D., M.S. Interventional Cardiologist   Pager # [phone]

## 2014-08-27 ENCOUNTER — Encounter: Payer: Self-pay | Admitting: Cardiology

## 2014-08-27 NOTE — Assessment & Plan Note (Signed)
On Zetia. Memory issues are much improved. Is due for recheck lipid panel 2 see what her lipids look like being off statin.

## 2014-08-27 NOTE — Assessment & Plan Note (Signed)
Notably improved and for that she only has very rare, fleeting episodes. She would like to see if she could potentially back abnormal on the Toprol. It is acceptable to go to 1/2 tablet, however she can also use an additional half tablet if needed for rapid/irregular heartbeats. Keep [place] informed of her symptoms. For now does not sound like a true arrhythmia.[name]

## 2014-09-25 ENCOUNTER — Other Ambulatory Visit: Payer: Self-pay | Admitting: *Deleted

## 2014-09-25 ENCOUNTER — Telehealth: Payer: Self-pay | Admitting: *Deleted

## 2014-09-25 DIAGNOSIS — R413 Other amnesia: Secondary | ICD-10-CM

## 2014-09-25 DIAGNOSIS — E7800 Pure hypercholesterolemia, unspecified: Secondary | ICD-10-CM

## 2014-09-25 DIAGNOSIS — Z79899 Other long term (current) drug therapy: Secondary | ICD-10-CM

## 2014-09-25 DIAGNOSIS — E785 Hyperlipidemia, unspecified: Secondary | ICD-10-CM

## 2014-09-25 NOTE — Telephone Encounter (Signed)
-----   Message from Raiford Simmonds, RN sent at 04/30/2014 10:36 AM EST ----- Labs need cmp ,lipid  For June 2016-- Looked at dec 2016 info

## 2014-09-25 NOTE — Telephone Encounter (Signed)
Mailed letter and lab slip will be due October 27 2014

## 2014-10-09 ENCOUNTER — Encounter: Payer: Self-pay | Admitting: Cardiology

## 2014-10-13 ENCOUNTER — Telehealth: Payer: Self-pay | Admitting: *Deleted

## 2014-10-13 NOTE — Telephone Encounter (Signed)
Patient called to inform office.  Labs were done at primary DR [facility] office in August 26 2014.( SCANNED ON 10/09/14) RN informed patient labs were scanned for Dr Ellyn Hack to review. Chol panel: CHOL       226 TRIG           49 HDL            78 CAL. LDL  138 CAL. VLDL  10 TOTAL CHOL/HDL RATIO  2.9 LDL/HDL RATIO                  1.8 NON-HDL CHOL.               148 LIVER PANEL IN NORMAL LIMITS.  RN informed will show to Dr Ellyn Hack and call her back with results

## 2014-10-13 NOTE — Telephone Encounter (Signed)
LDL not quite at goal -- she was doing better off of a statin.  If still not at goal next check (along with diet & exercise modifications) - may need to consider fibrate.  [place]

## 2014-10-14 ENCOUNTER — Telehealth: Payer: Self-pay | Admitting: *Deleted

## 2014-10-14 NOTE — Telephone Encounter (Signed)
Spoke to patient. She states that Dr Minna Antis has already started her on PRAVASTATIN 80 MG ( SHE THINKS) and will recheck in several months. Patient states she will be seeing Dr Shelia Media because Dr Minna Antis left practice to go to [place].

## 2014-10-14 NOTE — Telephone Encounter (Signed)
[place] requesting Rx for UTI. I [place]. Pt was fine with this will do this.

## 2014-10-14 NOTE — Telephone Encounter (Signed)
Left message to call back  

## 2014-10-14 NOTE — Addendum Note (Signed)
Addended by: Raiford Simmonds on: 10/14/2014 09:07 AM   Modules accepted: Medications

## 2014-10-14 NOTE — Telephone Encounter (Signed)
Added to medication list

## 2015-01-21 ENCOUNTER — Emergency Department (HOSPITAL_COMMUNITY)
Admission: EM | Admit: 2015-01-21 | Discharge: 2015-01-21 | Disposition: A | Discharge status: Home / Self Care | Payer: BLUE CROSS/BLUE SHIELD | Attending: Emergency Medicine | Admitting: Emergency Medicine

## 2015-01-21 ENCOUNTER — Encounter (HOSPITAL_COMMUNITY): Payer: Self-pay | Admitting: Emergency Medicine

## 2015-01-21 ENCOUNTER — Emergency Department (HOSPITAL_COMMUNITY): Payer: BLUE CROSS/BLUE SHIELD

## 2015-01-21 DIAGNOSIS — G4700 Insomnia, unspecified: Secondary | ICD-10-CM | POA: Insufficient documentation

## 2015-01-21 DIAGNOSIS — Z7982 Long term (current) use of aspirin: Secondary | ICD-10-CM | POA: Insufficient documentation

## 2015-01-21 DIAGNOSIS — Z85850 Personal history of malignant neoplasm of thyroid: Secondary | ICD-10-CM | POA: Insufficient documentation

## 2015-01-21 DIAGNOSIS — Z79899 Other long term (current) drug therapy: Secondary | ICD-10-CM | POA: Insufficient documentation

## 2015-01-21 DIAGNOSIS — R42 Dizziness and giddiness: Secondary | ICD-10-CM | POA: Diagnosis present

## 2015-01-21 DIAGNOSIS — M8580 Other specified disorders of bone density and structure, unspecified site: Secondary | ICD-10-CM | POA: Insufficient documentation

## 2015-01-21 DIAGNOSIS — Z87891 Personal history of nicotine dependence: Secondary | ICD-10-CM | POA: Insufficient documentation

## 2015-01-21 DIAGNOSIS — Z8619 Personal history of other infectious and parasitic diseases: Secondary | ICD-10-CM | POA: Insufficient documentation

## 2015-01-21 DIAGNOSIS — E780 Pure hypercholesterolemia: Secondary | ICD-10-CM | POA: Insufficient documentation

## 2015-01-21 DIAGNOSIS — K219 Gastro-esophageal reflux disease without esophagitis: Secondary | ICD-10-CM | POA: Insufficient documentation

## 2015-01-21 DIAGNOSIS — G2581 Restless legs syndrome: Secondary | ICD-10-CM | POA: Diagnosis not present

## 2015-01-21 LAB — URINALYSIS, ROUTINE W REFLEX MICROSCOPIC
BILIRUBIN URINE: NEGATIVE
Glucose, UA: NEGATIVE mg/dL
HGB URINE DIPSTICK: NEGATIVE
KETONES UR: NEGATIVE mg/dL
Leukocytes, UA: NEGATIVE
NITRITE: NEGATIVE
PH: 7 (ref 5.0–8.0)
Protein, ur: NEGATIVE mg/dL
SPECIFIC GRAVITY, URINE: 1.009 (ref 1.005–1.030)
UROBILINOGEN UA: 0.2 mg/dL (ref 0.0–1.0)

## 2015-01-21 LAB — CBC
HEMATOCRIT: 41.4 % (ref 36.0–46.0)
Hemoglobin: 13.7 g/dL (ref 12.0–15.0)
MCH: 30.2 pg (ref 26.0–34.0)
MCHC: 33.1 g/dL (ref 30.0–36.0)
MCV: 91.4 fL (ref 78.0–100.0)
PLATELETS: 227 10*3/uL (ref 150–400)
RBC: 4.53 MIL/uL (ref 3.87–5.11)
RDW: 13.1 % (ref 11.5–15.5)
WBC: 7.3 10*3/uL (ref 4.0–10.5)

## 2015-01-21 LAB — BASIC METABOLIC PANEL
Anion gap: 9 (ref 5–15)
BUN: 16 mg/dL (ref 6–20)
CO2: 26 mmol/L (ref 22–32)
CREATININE: 0.79 mg/dL (ref 0.44–1.00)
Calcium: 9.4 mg/dL (ref 8.9–10.3)
Chloride: 102 mmol/L (ref 101–111)
GFR calc Af Amer: >60 mL/min (ref >60)
GLUCOSE: 120 mg/dL — AB (ref 65–99)
POTASSIUM: 4.1 mmol/L (ref 3.5–5.1)
SODIUM: 137 mmol/L (ref 135–145)

## 2015-01-21 LAB — CBG MONITORING, ED: GLUCOSE-CAPILLARY: 105 mg/dL — AB (ref 65–99)

## 2015-01-21 MED ORDER — MECLIZINE HCL 25 MG PO TABS: 25.0000 mg | ORAL_TABLET | Freq: Three times a day (TID) | ORAL | Status: DC | PRN

## 2015-01-21 MED ORDER — DIPHENHYDRAMINE HCL 50 MG/ML IJ SOLN
50.0000 mg | Freq: Once | INTRAMUSCULAR | Status: AC
  Administered 2015-01-21: 50 mg via INTRAVENOUS
  Filled 2015-01-21: qty 1

## 2015-01-21 MED ORDER — METOCLOPRAMIDE HCL 5 MG/ML IJ SOLN
10.0000 mg | Freq: Once | INTRAMUSCULAR | Status: AC
  Administered 2015-01-21: 10 mg via INTRAVENOUS
  Filled 2015-01-21: qty 2

## 2015-01-21 MED ORDER — GADOBENATE DIMEGLUMINE 529 MG/ML IV SOLN
15.0000 mL | Freq: Once | INTRAVENOUS | Status: AC | PRN
  Administered 2015-01-21: 15 mL via INTRAVENOUS

## 2015-01-21 MED ORDER — ACETAMINOPHEN 500 MG PO TABS
1000.0000 mg | ORAL_TABLET | Freq: Once | ORAL | Status: AC
  Administered 2015-01-21: 1000 mg via ORAL
  Filled 2015-01-21: qty 2

## 2015-01-21 MED ORDER — SODIUM CHLORIDE 0.9 % IV BOLUS (SEPSIS)
1000.0000 mL | Freq: Once | INTRAVENOUS | Status: AC
  Administered 2015-01-21: 1000 mL via INTRAVENOUS

## 2015-01-21 NOTE — ED Notes (Addendum)
Pt states dizziness for 2 weeks.  Disoriented, tired, temple hurting tonight, inner ear feels full. Feels like a vice is around her head. Neck is stiff. Denies falling or hitting head. Did sit up 2 weeks ago and got dizzy and had to lay back down.  PMH : Dec hematoma 2010   States has an extra heartbeat normally, feels heart has been beating more than normal.

## 2015-01-21 NOTE — ED Provider Notes (Signed)
CSN: [id number]     Arrival date & time 01/21/15  0316 History   First MD Initiated Contact with Patient 01/21/15 0357     Chief Complaint  Patient presents with  . Dizziness     (Consider location/radiation/quality/duration/timing/severity/associated sxs/prior Treatment) HPI  Amanda [name] is a 64 y.o. female with past medical history of subdural hematoma presenting today with headache and dizziness. Patient states she has had worsening dizziness in the last 2 weeks. This is mostly positional and it causes nausea. She denies this occurring to her in the past, these are the same symptoms she had when she had her subdural hematoma 2010, patient is concerned for recurrence. Has been also states the patient has been confused and very forgetful which is unlike her. She has seen her chiropractor which helps intermittently but then comes back. She does no history of vertigo. She denies any other neurological symptoms. Patient otherwise has no further complaints.  10 Systems reviewed and are negative for acute change except as noted in the HPI.    Past Medical History  Diagnosis Date  . Celiac disease     Gluten free diet  . Elevated cholesterol   . Restless leg syndrome   . Myalgia and myositis, unspecified   . Osteoarthrosis, unspecified whether generalized or localized, unspecified site   . Insomnia   . Other abnormal blood chemistry   . Other symptoms involving cardiovascular system   . Osteopenia 04/2013    T score -2.0 FRAX 9.2%/1.1%  . Hiatal hernia   . Reflux   . STD (sexually transmitted disease)     HSV  . GERD (gastroesophageal reflux disease)   . Thyroid cancer   . Change in voice   . Bruises easily    Past Surgical History  Procedure Laterality Date  . Knee surgery  2002    both knees  . Thyroid surgery      Partial thyroidectomy  . Abdominal hysterectomy  1986    TAH  . Breast surgery  1999    Reduction  . Brain surgery  2010    hemorrhage  . Nm myoview  ltd  November 2012    No ischemia or infarction  . Transthoracic echocardiogram   April 2013; 01/18/2013    Grade 1 diastolic dysfunction; normal EF 60-65%. No significant valve disease  . Colonoscopy    . Esophagogastroduodenoscopy     Family History  Problem Relation Age of Onset  . Diabetes Maternal Aunt   . Breast cancer Maternal Aunt     Maternal Great aunt-Age 4's  . Heart disease Maternal Grandfather   . Hyperlipidemia Maternal Grandfather   . Hypertension Paternal Grandfather   . Liver cancer Father   . Colon cancer Neg Hx    Social History  Substance Use Topics  . Smoking status: Former Research scientist (life sciences)  . Smokeless tobacco: Never Used  . Alcohol Use: 0.5 oz/week    1 drink(s) per week   OB History    Gravida Para Term Preterm AB TAB SAB Ectopic Multiple Living   2 2 2       2      Review of Systems    Allergies  Ambien; Cortisone; Eszopiclone and related; Levofloxacin; Dicloxacillin; Dilantin; Erythromycin; Avelox; Other; Nitrofuran derivatives; Phenytoin sodium extended; and Trazodone and nefazodone  Home Medications   Prior to Admission medications   Medication Sig Start Date End Date Taking? Authorizing Provider  APPLE CIDER VINEGAR PO Take 4 oz by mouth 2 (two) times  daily.    Historical Provider, MD  aspirin EC 81 MG EC tablet Take 1 tablet (81 mg total) by mouth daily. 01/18/13   Kinnie Feil, MD  CALCIUM-VITAMIN D PO Take 2 tablets by mouth daily.    Historical Provider, MD  clonazePAM (KLONOPIN) 1 MG tablet Take 0.5 mg by mouth at bedtime.    Historical Provider, MD  cycloSPORINE (RESTASIS) 0.05 % ophthalmic emulsion Place 1 drop into both eyes 2 (two) times daily.    Historical Provider, MD  escitalopram (LEXAPRO) 10 MG tablet Take 10 mg by mouth daily. 07/07/14   Historical Provider, MD  ezetimibe (ZETIA) 10 MG tablet Take 1 tablet (10 mg total) by mouth daily. 04/21/14   Leonie Man, MD  metoprolol succinate (TOPROL-XL) 25 MG 24 hr tablet TAKE 1 TABLET  BY MOUTH EVERY EVENING 06/11/14   Leonie Man, MD  Multiple Vitamin (MULTIVITAMIN) tablet Take 1 tablet by mouth daily.      Historical Provider, MD  pravastatin (PRAVACHOL) 80 MG tablet Take 80 mg by mouth daily.    Historical Provider, MD  Propylene Glycol (SYSTANE BALANCE) 0.6 % SOLN Apply 1 drop to eye 2 (two) times daily. For dry eyes, takes along with restasis    Historical Provider, MD  rOPINIRole (REQUIP) 0.25 MG tablet Take 0.25 mg by mouth at bedtime.    Historical Provider, MD  VITAMIN A PO Take 1 tablet by mouth daily.    Historical Provider, MD  VITAMIN D, CHOLECALCIFEROL, PO Take 1 tablet by mouth daily.     Historical Provider, MD   BP 153/69 mmHg  Pulse 64  Temp(Src) 97.8 F (36.6 C)  Resp 15  Ht 5' 5.5" (1.664 m)  Wt 155 lb (70.308 kg)  BMI 25.39 kg/m2  SpO2 98% Physical Exam  Constitutional: She is oriented to person, place, and time. She appears well-developed and well-nourished. No distress.  HENT:  Head: Normocephalic and atraumatic.  Nose: Nose normal.  Mouth/Throat: Oropharynx is clear and moist. No oropharyngeal exudate.  Eyes: Conjunctivae and EOM are normal. Pupils are equal, round, and reactive to light. No scleral icterus.  Neck: Normal range of motion. Neck supple. No JVD present. No tracheal deviation present. No thyromegaly present.  Cardiovascular: Normal rate, regular rhythm and normal heart sounds.  Exam reveals no gallop and no friction rub.   No murmur heard. Pulmonary/Chest: Effort normal and breath sounds normal. No respiratory distress. She has no wheezes. She exhibits no tenderness.  Abdominal: Soft. Bowel sounds are normal. She exhibits no distension and no mass. There is no tenderness. There is no rebound and no guarding.  Musculoskeletal: Normal range of motion. She exhibits no edema or tenderness.  Lymphadenopathy:    She has no cervical adenopathy.  Neurological: She is alert and oriented to person, place, and time. No cranial nerve  deficit. She exhibits normal muscle tone.  Normal strength and sensation in all activities. Normal cerebellar testing. Normal gait.  Skin: Skin is warm and dry. No rash noted. No erythema. No pallor.  Nursing note and vitals reviewed.   ED Course  Procedures (including critical care time) Labs Review Labs Reviewed  BASIC METABOLIC PANEL - Abnormal; Notable for the following:    Glucose, Bld 120 (*)    All other components within normal limits  CBG MONITORING, ED - Abnormal; Notable for the following:    Glucose-Capillary 105 (*)    All other components within normal limits  CBC  URINALYSIS, ROUTINE W REFLEX  MICROSCOPIC (NOT AT [facility])    Imaging Review Mr Jeri Cos Wo Contrast  01/21/2015   CLINICAL DATA:  Initial evaluation for 2 week history of dizziness. History of prior subdural evacuation.  EXAM: MRI HEAD WITHOUT AND WITH CONTRAST  TECHNIQUE: Multiplanar, multiecho pulse sequences of the brain and surrounding structures were obtained without and with intravenous contrast.  CONTRAST:  23m MULTIHANCE GADOBENATE DIMEGLUMINE 529 MG/ML IV SOLN  COMPARISON:  Prior MRI from 01/18/2013.  FINDINGS: Cerebral volume within normal limits for patient age. Minimal T2/FLAIR hyperintensity within the periventricular and deep white matter, likely related to minimal chronic small vessel ischemic disease, felt to be within normal limits for patient age.  No abnormal foci of restricted diffusion to suggest acute intracranial infarct. Gray-white matter differentiation maintained. Normal intravascular flow voids are present. No acute intracranial hemorrhage. Small amount of chronic hemosiderin staining overlies the left cerebral hemisphere, likely related to prior subdural. No areas of chronic infarction.  No mass lesion, midline shift, or mass effect. No abnormal enhancement. No hydrocephalus. No extra-axial fluid collection.  Craniocervical junction within normal limits. Pituitary gland normal. No acute  abnormality about the orbits.  Minimal opacity seen layering within the left maxillary sinus. Paranasal sinuses are otherwise clear. No mastoid effusion. Inner ear structures grossly normal.  Postoperative changes from prior craniotomy seen at the left calvarium. Bone marrow signal intensity within normal limits. No scalp soft tissue abnormality.  IMPRESSION: 1. No acute intracranial process identified. 2. Sequelae of prior left craniotomy, presumably for prior subdural hematoma per history. Otherwise normal brain MRI.   Electronically Signed   By: BJeannine BogaM.D.   On: 01/21/2015 06:38   I have personally reviewed and evaluated these images and lab results as part of my medical decision-making.   EKG Interpretation   Date/Time:  Wednesday January 21 2015 03:36:45 EDT Ventricular Rate:  67 PR Interval:  156 QRS Duration: 84 QT Interval:  416 QTC Calculation: 439 R Axis:   7 Text Interpretation:  Normal sinus rhythm Normal ECG No significant change  since last tracing Confirmed by OGlynn Octave([phone] on 01/21/2015  4:24:21 AM      MDM   Final diagnoses:  None   patient presents to the emergency department for worsening dizziness over the last 2 weeks. They're concerned for repeat subdural hematoma, I have concern for possible stroke. Will evaluate with MRI of the brain. Laboratory studies are unremarkable. Patient is given Tylenol, Reglan, Benadryl for her headache. History is also consistent with benign peripheral positional vertigo as his symptoms are worsening with head movement. Education regarding this disease entity was provided.  MRI is normal.  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  Education provided.  Will DC with meclizine PRN and neurology follow up.  Headache has resolved with medication.  She appears well and in NAD.  Her VS remain within her normal limits and she is safe for DC.  AEverlene Balls MD 01/21/15 [phone]

## 2015-01-21 NOTE — ED Notes (Signed)
Patient having two weeks of dizziness.  Went to chiropractor who stated that it could be vertigo.  Patient states she feels dizzy when laying down or trying to get up.

## 2015-01-21 NOTE — ED Notes (Signed)
Patient transported to MRI 

## 2015-01-21 NOTE — Discharge Instructions (Signed)
Dizziness Amanda [name], your MRI results are below.  There is no stroke or bleeding in the brain.  You likely have vertigo and can take meclizine as needed for your dizziness.  See neurology within 3 days for close follow up.  If symptoms worsen, come back to the ED immediately.  Thank you.  FINDINGS: Cerebral volume within normal limits for patient age. Minimal T2/FLAIR hyperintensity within the periventricular and deep white matter, likely related to minimal chronic small vessel ischemic disease, felt to be within normal limits for patient age.  No abnormal foci of restricted diffusion to suggest acute intracranial infarct. Gray-white matter differentiation maintained. Normal intravascular flow voids are present. No acute intracranial hemorrhage. Small amount of chronic hemosiderin staining overlies the left cerebral hemisphere, likely related to prior subdural. No areas of chronic infarction.  No mass lesion, midline shift, or mass effect. No abnormal enhancement. No hydrocephalus. No extra-axial fluid collection.  Craniocervical junction within normal limits. Pituitary gland normal. No acute abnormality about the orbits.  Minimal opacity seen layering within the left maxillary sinus. Paranasal sinuses are otherwise clear. No mastoid effusion. Inner ear structures grossly normal.  Postoperative changes from prior craniotomy seen at the left calvarium. Bone marrow signal intensity within normal limits. No scalp soft tissue abnormality.  IMPRESSION: 1. No acute intracranial process identified. 2. Sequelae of prior left craniotomy, presumably for prior subdural hematoma per history. Otherwise normal brain MRI.    Dizziness means you feel unsteady or lightheaded. You might feel like you are going to pass out (faint). HOME CARE   Drink enough fluids to keep your pee (urine) clear or pale yellow.  Take your medicines exactly as told by your doctor. If you take blood pressure  medicine, always stand up slowly from the lying or sitting position. Hold on to something to steady yourself.  If you need to stand in one place for a long time, move your legs often. Tighten and relax your leg muscles.  Have someone stay with you until you feel okay.  Do not drive or use heavy machinery if you feel dizzy.  Do not drink alcohol. GET HELP RIGHT AWAY IF:   You feel dizzy or lightheaded and it gets worse.  You feel sick to your stomach (nauseous), or you throw up (vomit).  You have trouble talking or walking.  You feel weak or have trouble using your arms, hands, or legs.  You cannot think clearly or have trouble forming sentences.  You have chest pain, belly (abdominal) pain, sweating, or you are short of breath.  Your vision changes.  You are bleeding.  You have problems from your medicine that seem to be getting worse. MAKE SURE YOU:   Understand these instructions.  Will watch your condition.  Will get help right away if you are not doing well or get worse. Document Released: 04/14/2011 Document Revised: 07/18/2011 Document Reviewed: 04/14/2011 [facility] Patient Information 2015 [place], [place]. This information is not intended to replace advice given to you by your health care provider. Make sure you discuss any questions you have with your health care provider.

## 2015-02-04 ENCOUNTER — Telehealth: Payer: Self-pay | Admitting: Cardiology

## 2015-02-04 NOTE — Telephone Encounter (Signed)
Patient has history of palpitations and has had them more frequently this month and gets tired out and other days feels just fine.  No history of A fib.  Is taking her Metoprolol 25 mg daily as ordered.  Does not montior BP oor HR at home  Does not recall having any dietary changes such as increased caffeine  Is exercising and does not see an increase with palpitations  She had had these symptoms on and off for a month.  She is going to the [facility] in 2 days and wonders if she would need to be seen  No chest pain or shortness or breath.  Given Antivert for her vertigo diagnosis in the ED 2 weeks ago and has not taken any.  Told her to monitor for any worsening of palpitations or other symptoms.  Told her I would send message to Dr. Ellyn Hack if he wants her to follow up for OV for complaints

## 2015-02-04 NOTE — Telephone Encounter (Signed)
Pt called in stating that for the past month she has been having some irregular heart beats and some fatigue. She is becoming concerned and would like to be seen before she goes [place] the [place] on [place]. Please f/u with pt  Thanks

## 2015-02-04 NOTE — Telephone Encounter (Signed)
OK to take additional Metoprolol dose PRN onset of palpitations.  HARDING, DAVID W

## 2015-02-05 MED ORDER — METOPROLOL SUCCINATE ER 25 MG PO TB24: 25.0000 mg | ORAL_TABLET | Freq: Every evening | ORAL | Status: DC

## 2015-02-05 NOTE — Telephone Encounter (Signed)
Called patient  Instructed her to take an additional dose of Metoprolol when she has an onset of palpitations  Changed script to reflect above

## 2015-02-24 ENCOUNTER — Other Ambulatory Visit: Payer: Self-pay

## 2015-02-24 DIAGNOSIS — Z1231 Encounter for screening mammogram for malignant neoplasm of breast: Secondary | ICD-10-CM

## 2015-02-27 ENCOUNTER — Encounter: Payer: Self-pay | Admitting: Gynecology

## 2015-02-27 ENCOUNTER — Ambulatory Visit (INDEPENDENT_AMBULATORY_CARE_PROVIDER_SITE_OTHER): Payer: BLUE CROSS/BLUE SHIELD | Admitting: Gynecology

## 2015-02-27 VITALS — BP 124/76 | Ht 66.0 in | Wt 158.0 lb

## 2015-02-27 DIAGNOSIS — N952 Postmenopausal atrophic vaginitis: Secondary | ICD-10-CM | POA: Diagnosis not present

## 2015-02-27 DIAGNOSIS — N816 Rectocele: Secondary | ICD-10-CM | POA: Diagnosis not present

## 2015-02-27 DIAGNOSIS — M8580 Other specified disorders of bone density and structure, unspecified site: Secondary | ICD-10-CM

## 2015-02-27 DIAGNOSIS — Z01419 Encounter for gynecological examination (general) (routine) without abnormal findings: Secondary | ICD-10-CM

## 2015-02-27 NOTE — Patient Instructions (Addendum)
Follow up for your bone density in January or February of this coming year.   You may obtain a copy of any labs that were done today by logging onto MyChart as outlined in the instructions provided with your AVS (after visit summary). The office will not call with normal lab results but certainly if there are any significant abnormalities then we will contact you.   Health Maintenance Adopting a healthy lifestyle and getting preventive care can go a long way to promote health and wellness. Talk with your health care provider about what schedule of regular examinations is right for you. This is a good chance for you to check in with your provider about disease prevention and staying healthy. In between checkups, there are plenty of things you can do on your own. Experts have done a lot of research about which lifestyle changes and preventive measures are most likely to keep you healthy. Ask your health care provider for more information. WEIGHT AND DIET  Eat a healthy diet  Be sure to include plenty of vegetables, fruits, low-fat dairy products, and lean protein.  Do not eat a lot of foods high in solid fats, added sugars, or salt.  Get regular exercise. This is one of the most important things you can do for your health.  Most adults should exercise for at least 150 minutes each week. The exercise should increase your heart rate and make you sweat (moderate-intensity exercise).  Most adults should also do strengthening exercises at least twice a week. This is in addition to the moderate-intensity exercise.  Maintain a healthy weight  Body mass index (BMI) is a measurement that can be used to identify possible weight problems. It estimates body fat based on height and weight. Your health care provider can help determine your BMI and help you achieve or maintain a healthy weight.  For females 50 years of age and older:   A BMI below 18.5 is considered underweight.  A BMI of 18.5 to 24.9  is normal.  A BMI of 25 to 29.9 is considered overweight.  A BMI of 30 and above is considered obese.  Watch levels of cholesterol and blood lipids  You should start having your blood tested for lipids and cholesterol at 65 years of age, then have this test every 5 years.  You may need to have your cholesterol levels checked more often if:  Your lipid or cholesterol levels are high.  You are older than 65 years of age.  You are at high risk for heart disease.  CANCER SCREENING   Lung Cancer  Lung cancer screening is recommended for adults 19-61 years old who are at high risk for lung cancer because of a history of smoking.  A yearly low-dose CT scan of the lungs is recommended for people who:  Currently smoke.  Have quit within the past 15 years.  Have at least a 30-pack-year history of smoking. A pack year is smoking an average of one pack of cigarettes a day for 1 year.  Yearly screening should continue until it has been 15 years since you quit.  Yearly screening should stop if you develop a health problem that would prevent you from having lung cancer treatment.  Breast Cancer  Practice breast self-awareness. This means understanding how your breasts normally appear and feel.  It also means doing regular breast self-exams. Let your health care provider know about any changes, no matter how small.  If you are in your 77s or  3s, you should have a clinical breast exam (CBE) by a health care provider every 1-3 years as part of a regular health exam.  If you are 36 or older, have a CBE every year. Also consider having a breast X-ray (mammogram) every year.  If you have a family history of breast cancer, talk to your health care provider about genetic screening.  If you are at high risk for breast cancer, talk to your health care provider about having an MRI and a mammogram every year.  Breast cancer gene (BRCA) assessment is recommended for women who have family  members with BRCA-related cancers. BRCA-related cancers include:  Breast.  Ovarian.  Tubal.  Peritoneal cancers.  Results of the assessment will determine the need for genetic counseling and BRCA1 and BRCA2 testing. Cervical Cancer Routine pelvic examinations to screen for cervical cancer are no longer recommended for nonpregnant women who are considered low risk for cancer of the pelvic organs (ovaries, uterus, and vagina) and who do not have symptoms. A pelvic examination may be necessary if you have symptoms including those associated with pelvic infections. Ask your health care provider if a screening pelvic exam is right for you.   The Pap test is the screening test for cervical cancer for women who are considered at risk.  If you had a hysterectomy for a problem that was not cancer or a condition that could lead to cancer, then you no longer need Pap tests.  If you are older than 65 years, and you have had normal Pap tests for the past 10 years, you no longer need to have Pap tests.  If you have had past treatment for cervical cancer or a condition that could lead to cancer, you need Pap tests and screening for cancer for at least 20 years after your treatment.  If you no longer get a Pap test, assess your risk factors if they change (such as having a new sexual partner). This can affect whether you should start being screened again.  Some women have medical problems that increase their chance of getting cervical cancer. If this is the case for you, your health care provider may recommend more frequent screening and Pap tests.  The human papillomavirus (HPV) test is another test that may be used for cervical cancer screening. The HPV test looks for the virus that can cause cell changes in the cervix. The cells collected during the Pap test can be tested for HPV.  The HPV test can be used to screen women 67 years of age and older. Getting tested for HPV can extend the interval  between normal Pap tests from three to five years.  An HPV test also should be used to screen women of any age who have unclear Pap test results.  After 65 years of age, women should have HPV testing as often as Pap tests.  Colorectal Cancer  This type of cancer can be detected and often prevented.  Routine colorectal cancer screening usually begins at 65 years of age and continues through 65 years of age.  Your health care provider may recommend screening at an earlier age if you have risk factors for colon cancer.  Your health care provider may also recommend using home test kits to check for hidden blood in the stool.  A small camera at the end of a tube can be used to examine your colon directly (sigmoidoscopy or colonoscopy). This is done to check for the earliest forms of colorectal cancer.  Routine screening usually begins at age 63.  Direct examination of the colon should be repeated every 5-10 years through 65 years of age. However, you may need to be screened more often if early forms of precancerous polyps or small growths are found. Skin Cancer  Check your skin from head to toe regularly.  Tell your health care provider about any new moles or changes in moles, especially if there is a change in a mole's shape or color.  Also tell your health care provider if you have a mole that is larger than the size of a pencil eraser.  Always use sunscreen. Apply sunscreen liberally and repeatedly throughout the day.  Protect yourself by wearing long sleeves, pants, a wide-brimmed hat, and sunglasses whenever you are outside. HEART DISEASE, DIABETES, AND HIGH BLOOD PRESSURE   Have your blood pressure checked at least every 1-2 years. High blood pressure causes heart disease and increases the risk of stroke.  If you are between 19 years and 40 years old, ask your health care provider if you should take aspirin to prevent strokes.  Have regular diabetes screenings. This involves  taking a blood sample to check your fasting blood sugar level.  If you are at a normal weight and have a low risk for diabetes, have this test once every three years after 65 years of age.  If you are overweight and have a high risk for diabetes, consider being tested at a younger age or more often. PREVENTING INFECTION  Hepatitis B  If you have a higher risk for hepatitis B, you should be screened for this virus. You are considered at high risk for hepatitis B if:  You were born in a country where hepatitis B is common. Ask your health care provider which countries are considered high risk.  Your parents were born in a high-risk country, and you have not been immunized against hepatitis B (hepatitis B vaccine).  You have HIV or AIDS.  You use needles to inject street drugs.  You live with someone who has hepatitis B.  You have had sex with someone who has hepatitis B.  You get hemodialysis treatment.  You take certain medicines for conditions, including cancer, organ transplantation, and autoimmune conditions. Hepatitis C  Blood testing is recommended for:  Everyone born from [birth year] through 1966.  Anyone with known risk factors for hepatitis C. Sexually transmitted infections (STIs)  You should be screened for sexually transmitted infections (STIs) including gonorrhea and chlamydia if:  You are sexually active and are younger than 66 years of age.  You are older than 65 years of age and your health care provider tells you that you are at risk for this type of infection.  Your sexual activity has changed since you were last screened and you are at an increased risk for chlamydia or gonorrhea. Ask your health care provider if you are at risk.  If you do not have HIV, but are at risk, it may be recommended that you take a prescription medicine daily to prevent HIV infection. This is called pre-exposure prophylaxis (PrEP). You are considered at risk if:  You are sexually active  and do not regularly use condoms or know the HIV status of your partner(s).  You take drugs by injection.  You are sexually active with a partner who has HIV. Talk with your health care provider about whether you are at high risk of being infected with HIV. If you choose to begin PrEP, you should first  be tested for HIV. You should then be tested every 3 months for as long as you are taking PrEP.  PREGNANCY   If you are premenopausal and you may become pregnant, ask your health care provider about preconception counseling.  If you may become pregnant, take 400 to 800 micrograms (mcg) of folic acid every day.  If you want to prevent pregnancy, talk to your health care provider about birth control (contraception). OSTEOPOROSIS AND MENOPAUSE   Osteoporosis is a disease in which the bones lose minerals and strength with aging. This can result in serious bone fractures. Your risk for osteoporosis can be identified using a bone density scan.  If you are 6 years of age or older, or if you are at risk for osteoporosis and fractures, ask your health care provider if you should be screened.  Ask your health care provider whether you should take a calcium or vitamin D supplement to lower your risk for osteoporosis.  Menopause may have certain physical symptoms and risks.  Hormone replacement therapy may reduce some of these symptoms and risks. Talk to your health care provider about whether hormone replacement therapy is right for you.  HOME CARE INSTRUCTIONS   Schedule regular health, dental, and eye exams.  Stay current with your immunizations.   Do not use any tobacco products including cigarettes, chewing tobacco, or electronic cigarettes.  If you are pregnant, do not drink alcohol.  If you are breastfeeding, limit how much and how often you drink alcohol.  Limit alcohol intake to no more than 1 drink per day for nonpregnant women. One drink equals 12 ounces of beer, 5 ounces of wine,  or 1 ounces of hard liquor.  Do not use street drugs.  Do not share needles.  Ask your health care provider for help if you need support or information about quitting drugs.  Tell your health care provider if you often feel depressed.  Tell your health care provider if you have ever been abused or do not feel safe at home. Document Released: 11/08/2010 Document Revised: 09/09/2013 Document Reviewed: 03/27/2013 [facility] Patient Information 2015 [place], [place]. This information is not intended to replace advice given to you by your health care provider. Make sure you discuss any questions you have with your health care provider.

## 2015-02-27 NOTE — Progress Notes (Signed)
Amanda [name] 06/19/1950 [id number]        64 y.o.  G2P2002 for annual exam.  Doing well without complaints.  Past medical history,surgical history, problem list, medications, allergies, family history and social history were all reviewed and documented as reviewed in the EPIC chart.  ROS:  Performed with pertinent positives and negatives included in the history, assessment and plan.   Additional significant findings :  none   Exam: [name] Vitals:   02/27/15 1158  BP: 124/76  Height: 5' 6"  (1.676 m)  Weight: 158 lb (71.668 kg)   General appearance:  Normal affect, orientation and appearance. Skin: Grossly normal HEENT: Without gross lesions.  No cervical or supraclavicular adenopathy. Thyroid normal.  Lungs:  Clear without wheezing, rales or rhonchi Cardiac: RR, without RMG Abdominal:  Soft, nontender, without masses, guarding, rebound, organomegaly or hernia Breasts:  Examined lying and sitting without masses, retractions, discharge or axillary adenopathy. Pelvic:  Ext/BUS/vagina with atrophic changes. Mild rectocele noted. Cuff well supported. No significant cystocele.  Adnexa  Without masses or tenderness    Anus and perineum  Normal   Rectovaginal  Normal sphincter tone without palpated masses or tenderness.    Assessment/Plan:  64 y.o. G9P2002 female for annual exam.   1. Postmenopausal/atrophic genital changes. 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 Doing well without significant hot flushes, night sweats, vaginal dryness. Continue to monitor report any issues. 2. History of hematuria. Was seen earlier this year with episode hematuria. Admitted arrangements for her to be seen at [facility] urology. She never followed through with this appointment. Has had no recurrence of the hematuria. I reviewed with her that cannot guarantee that is not pathology such as tumor although with no recurrent episodes most likely due to an episode of hemorrhagic cystitis.  Patient is comfortable not following up with urology at this point given the disclaimer. We'll check baseline urinalysis today. 3. Osteopenia. DEXA 2014 T score -2 FRAX 9%/1.1%. Repeat DEXA this coming year at two-year interval. Patient agrees to schedule follow up for this. 4. History of herpes labialis. Uses Valtrex as needed. Will call if she needs supply. 5. Pap smear of vaginal cuff 2015. No Pap smear done today. No history of abnormal Pap smears. Status post hysterectomy for benign indications. Options to stop screening altogether less frequent screening intervals reviewed. Will readdress on annual basis. 6. Colonoscopy 2013. Repeat at their recommended interval. 7. Mammogramcoming due in November and she has this scheduled. SBE monthly reviewed. 8. Health maintenance. No routine blood work done as she has this done at her primary physician's office. Follow up 1 year, sooner as needed.   [name], 12:25 PM 02/27/2015

## 2015-02-28 ENCOUNTER — Other Ambulatory Visit: Payer: Self-pay | Admitting: Cardiology

## 2015-02-28 LAB — URINALYSIS W MICROSCOPIC + REFLEX CULTURE
BILIRUBIN URINE: NEGATIVE
Bacteria, UA: NONE SEEN [HPF]
Casts: NONE SEEN [LPF]
Glucose, UA: NEGATIVE
Hgb urine dipstick: NEGATIVE
KETONES UR: NEGATIVE
LEUKOCYTES UA: NEGATIVE
NITRITE: NEGATIVE
PH: 6 (ref 5.0–8.0)
PROTEIN: NEGATIVE
RBC / HPF: NONE SEEN RBC/HPF (ref <=2)
SPECIFIC GRAVITY, URINE: 1.024 (ref 1.001–1.035)
WBC UA: NONE SEEN WBC/HPF (ref <=5)
Yeast: NONE SEEN [HPF]

## 2015-03-11 ENCOUNTER — Ambulatory Visit (INDEPENDENT_AMBULATORY_CARE_PROVIDER_SITE_OTHER): Payer: BLUE CROSS/BLUE SHIELD | Admitting: Neurology

## 2015-03-11 ENCOUNTER — Encounter: Payer: Self-pay | Admitting: Neurology

## 2015-03-11 VITALS — BP 150/80 | HR 78 | Ht 66.0 in | Wt 157.7 lb

## 2015-03-11 DIAGNOSIS — H8110 Benign paroxysmal vertigo, unspecified ear: Secondary | ICD-10-CM | POA: Diagnosis not present

## 2015-03-11 NOTE — Progress Notes (Addendum)
NEUROLOGY CONSULTATION NOTE  MAYGAN KOELLER MRN: [id number] DOB: [birth year]  Referring provider: Everlene Balls, MD (ED) Primary care provider: Deland Pretty, MD  Reason for consult:  dizziness  HISTORY OF PRESENT ILLNESS: Penina Reisner is a 64 year old right-handed female with Celiac disease, RLS, osteoarthrosis and history of subdural hematoma and thyroid cancer who presents for dizziness.  History obtained by patient and ED note.  Images of brain MRI and labs reviewed.  In late-August, she bent forward and experienced spinning sensation when she stood up.  It lasted a few seconds and resolved.   On 01/21/15, she developed a left sided pressure-like headache.  She went to the ED for further evaluation. MRI of the brain with and without contrast showed sequelae of prior left craniotomy but no acute abnormality.  CBC, BMP and UA were unremarkable.  EKG showed normal sinus rhythm.  She was discharged with meclizine.  She has been doing well.  Of note, she also reports short term memory problems.  Sometimes she may repeat something or briefly get confused with directions while driving.  It only occurs once in a while.  Her grandmother had dementia.  In 2010, she sustained a traumatic subdural hematoma.  PAST MEDICAL HISTORY: Past Medical History  Diagnosis Date  . Celiac disease     Gluten free diet  . Elevated cholesterol   . Restless leg syndrome   . Myalgia and myositis, unspecified   . Osteoarthrosis, unspecified whether generalized or localized, unspecified site   . Insomnia   . Other abnormal blood chemistry   . Other symptoms involving cardiovascular system   . Osteopenia 04/2013    T score -2.0 FRAX 9.2%/1.1%  . Hiatal hernia   . Reflux   . STD (sexually transmitted disease)     HSV  . GERD (gastroesophageal reflux disease)   . Thyroid cancer ([place])   . Change in voice   . Bruises easily   . Vertigo     PAST SURGICAL HISTORY: Past Surgical History  Procedure  Laterality Date  . Knee surgery  2002    both knees  . Thyroid surgery      Partial thyroidectomy  . Abdominal hysterectomy  1986    TAH  . Breast surgery  1999    Reduction  . Brain surgery  2010    hemorrhage  . Nm myoview ltd  November 2012    No ischemia or infarction  . Transthoracic echocardiogram   April 2013; 01/18/2013    Grade 1 diastolic dysfunction; normal EF 60-65%. No significant valve disease  . Colonoscopy    . Esophagogastroduodenoscopy      MEDICATIONS: Current Outpatient Prescriptions on File Prior to Visit  Medication Sig Dispense Refill  . APPLE CIDER VINEGAR PO Take 1 capsule by mouth 2 (two) times daily.    [name] aspirin EC 81 MG EC tablet Take 1 tablet (81 mg total) by mouth daily.    [name] atorvastatin (LIPITOR) 10 MG tablet Take 10 mg by mouth daily.    [name] CALCIUM-VITAMIN D PO Take 2 tablets by mouth daily.    . clonazePAM (KLONOPIN) 1 MG tablet Take 0.5 mg by mouth at bedtime.    . CO ENZYME Q-10 PO Take by mouth.    . cycloSPORINE (RESTASIS) 0.05 % ophthalmic emulsion Place 1 drop into both eyes 2 (two) times daily.    [name] escitalopram (LEXAPRO) 10 MG tablet Take 10 mg by mouth daily.  11  . metoprolol succinate (TOPROL-XL)  25 MG 24 hr tablet Take 1 tablet (25 mg total) by mouth every evening. May take an additional dose with onset of palpitations 30 tablet 8  . Multiple Vitamin (MULTIVITAMIN) tablet Take 1 tablet by mouth daily.      [name] Propylene Glycol (SYSTANE BALANCE) 0.6 % SOLN Apply 1 drop to eye 2 (two) times daily. For dry eyes, takes along with restasis    . rOPINIRole (REQUIP) 0.25 MG tablet Take 0.25 mg by mouth at bedtime.    [name] VITAMIN D, CHOLECALCIFEROL, PO Take 1 tablet by mouth daily.     . [DISCONTINUED] ezetimibe (ZETIA) 10 MG tablet Take 1 tablet (10 mg total) by mouth daily. 30 tablet 11   No current facility-administered medications on file prior to visit.    ALLERGIES: Allergies  Allergen Reactions  . Ambien [Zolpidem Tartrate] Other  (See Comments)    Causes Neurological problems with very bad dizziness, pains, disorientation  . Cortisone Other (See Comments)    REACTION: "mania" post ESI  . Eszopiclone And Related Other (See Comments)    Causes Neurological problems with very bad dizziness, pains, disorientation  . Levofloxacin Other (See Comments)    Muscle spasms and jerking movements  . Dicloxacillin Other (See Comments)    Stomach problems, swelling of tongue  . Dilantin [Phenytoin Sodium Extended] Hives and Itching  . Erythromycin Other (See Comments)    Tears stomach up  . Avelox [Moxifloxacin Hcl In Nacl]   . Other     Celiac disease  . Nitrofuran Derivatives Other (See Comments)    unknown  . Phenytoin Sodium Extended Rash  . Trazodone And Nefazodone Other (See Comments)    unknown    FAMILY HISTORY: Family History  Problem Relation Age of Onset  . Diabetes Maternal Aunt   . Breast cancer Maternal Aunt     Maternal Great aunt-Age 11's  . Heart disease Maternal Grandfather   . Hyperlipidemia Maternal Grandfather   . Hypertension Paternal Grandfather   . Liver cancer Father   . Colon cancer Neg Hx     SOCIAL HISTORY: Social History   Social History  . Marital Status: Married    Spouse Name: N/A  . Number of Children: N/A  . Years of Education: N/A   Occupational History  . Not on file.   Social History Main Topics  . Smoking status: Former Research scientist (life sciences)  . Smokeless tobacco: Never Used  . Alcohol Use: 0.6 oz/week    1 Standard drinks or equivalent per week  . Drug Use: No  . Sexual Activity: Yes    Birth Control/ Protection: Surgical     Comment: HYST-1st intercourse 64 yo-Fewer than 5 partners   Other Topics Concern  . Not on file   Social History Narrative   Married mother of 2 with no grandchildren.   Never smoked.   Angulatory and active. 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   Pt lives with husband in a  three story home, no issue with stairs. Highest level of education is an associates.     REVIEW OF SYSTEMS: Constitutional: No fevers, chills, or sweats, no generalized fatigue, change in appetite Eyes: No visual changes, double vision, eye pain Ear, nose and throat: No hearing loss, ear pain, nasal congestion, sore throat Cardiovascular: No chest pain, palpitations Respiratory:  No shortness of breath at rest or with exertion, wheezes GastrointestinaI: No nausea, vomiting, diarrhea, abdominal pain, fecal incontinence Genitourinary:  No dysuria, urinary retention or frequency Musculoskeletal:  No neck pain, back pain Integumentary: No rash, pruritus, skin lesions Neurological: as above Psychiatric: No depression, insomnia, anxiety Endocrine: No palpitations, fatigue, diaphoresis, mood swings, change in appetite, change in weight, increased thirst Hematologic/Lymphatic:  No anemia, purpura, petechiae. Allergic/Immunologic: no itchy/runny eyes, nasal congestion, recent allergic reactions, rashes  PHYSICAL EXAM: Filed Vitals:   03/11/15 1438  BP: 150/80  Pulse: 78   General: No acute distress.  Patient appears well-groomed.  Head:  Normocephalic/atraumatic Eyes:  fundi unremarkable, without vessel changes, exudates, hemorrhages or papilledema. Neck: supple, no paraspinal tenderness, full range of motion Back: No paraspinal tenderness Heart: regular rate and rhythm Lungs: Clear to auscultation bilaterally. Vascular: No carotid bruits. Neurological Exam: Mental status: alert and oriented to person, place, and time, recent and remote memory intact, fund of knowledge intact, attention and concentration intact, speech fluent and not dysarthric, language intact. MMSE - Mini Mental State Exam 03/11/2015  Orientation to time 5  Orientation to Place 5  Registration 3  Attention/ Calculation 4  Recall 3  Language- name 2 objects 2  Language- repeat 1  Language- follow 3 step command 3    Language- read & follow direction 1  Write a sentence 1  Copy design 1  Total score 29   Cranial nerves: CN I: not tested CN II: pupils equal, round and reactive to light, visual fields intact, fundi unremarkable, without vessel changes, exudates, hemorrhages or papilledema. CN III, IV, VI:  full range of motion, no nystagmus, no ptosis CN V: facial sensation intact CN VII: upper and lower face symmetric CN VIII: hearing intact CN IX, X: gag intact, uvula midline CN XI: sternocleidomastoid and trapezius muscles intact CN XII: tongue midline Bulk & Tone: normal, no fasciculations. Motor:  5/5 throughout  Sensation: temperature and vibration sensation intact. Deep Tendon Reflexes:  2+ throughout, toes downgoing. Finger to nose testing:  Without dysmetria.  Heel to shin:  Without dysmetria.  Gait:  Normal station and stride.  Able to turn and tandem walk. Romberg negative.  IMPRESSION: Benign paroxysmal positional vertigo I don't appreciate any cognitive impairment.  Memory concerns are likely normal aging. Elevated blood pressure  PLAN: No further treatment or workup.  Follow up as needed.  Blood pressure was elevated today.  Recommend recheck with PCP.  Thank you for allowing me to take part in the care of this patient.  Metta Clines, DO  CC:  Deland Pretty, MD

## 2015-03-11 NOTE — Patient Instructions (Signed)
I think you had benign paroxysmal positional vertigo.  If it should recur, consider meclizine (which you were prescribed by Dr. Claudine Mouton) or vestibular rehabilitation Follow up as needed

## 2015-03-12 NOTE — Progress Notes (Signed)
Chart forwarded.

## 2015-03-18 ENCOUNTER — Ambulatory Visit
Admission: RE | Admit: 2015-03-18 | Discharge: 2015-03-18 | Disposition: A | Discharge status: Home / Self Care | Payer: BLUE CROSS/BLUE SHIELD | Source: Ambulatory Visit

## 2015-03-18 DIAGNOSIS — Z1231 Encounter for screening mammogram for malignant neoplasm of breast: Secondary | ICD-10-CM

## 2015-04-21 ENCOUNTER — Telehealth: Payer: Self-pay | Admitting: *Deleted

## 2015-04-21 DIAGNOSIS — E785 Hyperlipidemia, unspecified: Secondary | ICD-10-CM

## 2015-04-21 DIAGNOSIS — R413 Other amnesia: Secondary | ICD-10-CM

## 2015-04-21 DIAGNOSIS — Z23 Encounter for immunization: Secondary | ICD-10-CM | POA: Diagnosis not present

## 2015-04-21 DIAGNOSIS — Z79899 Other long term (current) drug therapy: Secondary | ICD-10-CM

## 2015-04-21 DIAGNOSIS — E7800 Pure hypercholesterolemia, unspecified: Secondary | ICD-10-CM

## 2015-04-21 NOTE — Telephone Encounter (Signed)
MAIL LETTER AND LIPID AND LIVER  LAB SLIP PATIENT HAD  LAB WORK PREVIOUSLY BY PRIMARY  RESCEDLUE FOR LAB TO BE DONE EARLY IN THE YEAR OF 2017.  THE TIME IS NOW.

## 2015-04-21 NOTE — Telephone Encounter (Signed)
-----   Message from Raiford Simmonds, RN sent at 04/21/2014  5:32 PM EST ----- Lipids and cmp may 2016  Mail in may 2016

## 2015-04-23 ENCOUNTER — Ambulatory Visit (INDEPENDENT_AMBULATORY_CARE_PROVIDER_SITE_OTHER): Payer: Medicare Other | Admitting: Physician Assistant

## 2015-04-23 ENCOUNTER — Encounter: Payer: Self-pay | Admitting: Physician Assistant

## 2015-04-23 VITALS — BP 134/86 | HR 84 | Ht 66.0 in | Wt 159.2 lb

## 2015-04-23 DIAGNOSIS — K219 Gastro-esophageal reflux disease without esophagitis: Secondary | ICD-10-CM | POA: Diagnosis not present

## 2015-04-23 DIAGNOSIS — K900 Celiac disease: Secondary | ICD-10-CM | POA: Diagnosis not present

## 2015-04-23 DIAGNOSIS — R194 Change in bowel habit: Secondary | ICD-10-CM

## 2015-04-23 MED ORDER — NA SULFATE-K SULFATE-MG SULF 17.5-3.13-1.6 GM/177ML PO SOLN: 1.0000 | Freq: Once | ORAL | Status: AC

## 2015-04-23 NOTE — Progress Notes (Signed)
Patient ID: Amanda [name], female   DOB: [birth year], 64 y.o.   MRN: [id number]   Subjective:    Patient ID: Amanda [name], female    DOB: Nov 19, 1950, 64 y.o.   MRN: [id number]  HPI [name] is a pleasant 64 year old white female known previously to Dr. Sharlett Iles who has diagnosis of celiac disease, GERD and prior EGD with dilation in 2012. She had a follow-up EGD in 2013 which was negative. Colonoscopy done in 2009 no polyps she had a nodular ileum which was biopsied and benign. She comes in today referred by Dr. Shelia Media for complaints of diarrhea and abdominal pain. She says she had significant diarrhea for 2-3 weeks ongoing associated with very malodorous stools and urgency. She put herself on a homeopathic regimen and says at this point she is much better and the diarrhea has resolved. She's not having any abdominal pain. She is also previously done massage therapy for her "hiatal hernia" symptoms. She says she does have problems still with frequent bloating and usually tends to be constipated am times using enemas. She currently is using apple cider vinegar for reflux symptoms and is taking a daily probiotic. She follows a completely gluten-free and diet and has done so for the past several years.  Review of Systems Pertinent positive and negative review of systems were noted in the above HPI section.  All other review of systems was otherwise negative.  Outpatient Encounter Prescriptions as of 04/23/2015  Medication Sig  . APPLE CIDER VINEGAR PO Take 1 capsule by mouth 2 (two) times daily.  [name] aspirin EC 81 MG EC tablet Take 1 tablet (81 mg total) by mouth daily.  [name] CALCIUM-VITAMIN D PO Take 2 tablets by mouth daily.  . clonazePAM (KLONOPIN) 1 MG tablet Take 0.5 mg by mouth at bedtime.  . CO ENZYME Q-10 PO Take by mouth.  . cycloSPORINE (RESTASIS) 0.05 % ophthalmic emulsion Place 1 drop into both eyes 2 (two) times daily.  [name] escitalopram (LEXAPRO) 10 MG tablet Take 10 mg by mouth daily.    . magnesium citrate SOLN Take 1 Bottle by mouth once.  . metoprolol succinate (TOPROL-XL) 25 MG 24 hr tablet Take 1 tablet (25 mg total) by mouth every evening. May take an additional dose with onset of palpitations  . Multiple Vitamin (MULTIVITAMIN) tablet Take 1 tablet by mouth daily.    [name] Propylene Glycol (SYSTANE BALANCE) 0.6 % SOLN Apply 1 drop to eye 2 (two) times daily. For dry eyes, takes along with restasis  . rOPINIRole (REQUIP) 0.25 MG tablet Take 0.25 mg by mouth at bedtime.  [name] VITAMIN D, CHOLECALCIFEROL, PO Take 1 tablet by mouth daily.   . [DISCONTINUED] atorvastatin (LIPITOR) 10 MG tablet Take 10 mg by mouth daily.  . Na Sulfate-K Sulfate-Mg Sulf SOLN Take 1 kit by mouth once.   No facility-administered encounter medications on file as of 04/23/2015.   Allergies  Allergen Reactions  . Ambien [Zolpidem Tartrate] Other (See Comments)    Causes Neurological problems with very bad dizziness, pains, disorientation  . Cortisone Other (See Comments)    REACTION: "mania" post ESI  . Eszopiclone And Related Other (See Comments)    Causes Neurological problems with very bad dizziness, pains, disorientation  . Levofloxacin Other (See Comments)    Muscle spasms and jerking movements  . Dicloxacillin Other (See Comments)    Stomach problems, swelling of tongue  . Dilantin [Phenytoin Sodium Extended] Hives and Itching  . Erythromycin Other (See Comments)  Tears stomach up  . Avelox [Moxifloxacin Hcl In Nacl]   . Other     Celiac disease  . Nitrofuran Derivatives Other (See Comments)    unknown  . Phenytoin Sodium Extended Rash  . Trazodone And Nefazodone Other (See Comments)    unknown   Patient Active Problem List   Diagnosis Date Noted  . Memory loss 04/21/2014  . Headache(784.0) 01/17/2013  . Right arm weakness 01/17/2013  . Heart palpitations 01/17/2013  . Thyroid cancer ([place])   . GERD (gastroesophageal reflux disease)   . Myalgia and myositis, unspecified   .  Restless leg syndrome   . Hypercholesteremia   . Reflux   . STD (sexually transmitted disease)   . Other chest pain 07/25/2011  . Chest pain, atypical 07/19/2011  . FATIGUE 07/05/2010  . EPIGASTRIC PAIN 01/27/2010  . BACK PAIN, LUMBAR 10/17/2009  . ACUTE BRONCHITIS 06/25/2009  . ALLERGIC RHINITIS 06/25/2009  . SUBDURAL HEMATOMA 04/30/2009  . GASTRITIS 02/16/2009  . DUODENITIS 02/16/2009  . HIATAL HERNIA 02/16/2009  . IRRITABLE BOWEL SYNDROME 02/16/2009  . Hyperlipidemia with target LDL less than 130 09/26/2008  . Celiac disease 11/27/2007  . INSOMNIA-SLEEP DISORDER-UNSPEC 07/05/2007  . DEGENERATIVE JOINT DISEASE, MILD 07/05/2007  . HYPOGLYCEMIA 10/03/2006  . ACID REFLUX DISEASE 10/03/2006  . OSTEOPENIA 10/03/2006  . CAROTID BRUIT, LEFT 10/03/2006  . HYPERGLYCEMIA 10/03/2006  . THYROID CANCER, HX OF 10/03/2006   Social History   Social History  . Marital Status: Married    Spouse Name: N/A  . Number of Children: 2  . Years of Education: N/A   Occupational History  . Not on file.   Social History Main Topics  . Smoking status: Former Research scientist (life sciences)  . Smokeless tobacco: Never Used  . Alcohol Use: 0.6 oz/week    1 Standard drinks or equivalent per week     Comment: very little  . Drug Use: No  . Sexual Activity: Yes    Birth Control/ Protection: Surgical     Comment: HYST-1st intercourse 64 yo-Fewer than 5 partners   Other Topics Concern  . Not on file   Social History Narrative   Married mother of 2 with no grandchildren.   Never smoked.   Angulatory and active. 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   Pt lives with husband in a three story home, no issue with stairs. Highest level of education is an associates.     Ms. Kouba's family history includes Breast cancer in her maternal aunt; Diabetes in her maternal aunt; Heart disease in her maternal grandfather; Hyperlipidemia in her maternal  grandfather; Hypertension in her paternal grandfather; Liver cancer in her father. There is no history of Colon cancer.      Objective:    Filed Vitals:   04/23/15 1002  BP: 134/86  Pulse: 84    Physical Exam    well-developed older white female in no acute distress, blood pressure 134/86 pulse 84 height 5 foot 6 weight 159. HEENT; nontraumatic cephalic EOMI PERRLA sclera anicteric, Cardiovascular; regular rate and rhythm with S1-S2 no murmur or gallop Pulmonary ;clear bilaterally, Abdomen; soft, nontender no palpable mass or hepatosplenomegaly bowel sounds are active, Rectal; exam not done, Extremities; no clubbing cyanosis or edema skin warm and dry, Neuropsych; mood and affect appropriate       Assessment & Plan:   #1 64 yo female with recent diarrheal illness she has now resolved, likely infectious/viral gastroenteritis #2 lower abdominal  discomfort, abdominal bloating-probably secondary to IBS rule out cold colon lesion, celiac related enteropathy #3 celiac disease #4 chronic GERD/subxiphoid discomfort  Plan; patient requesting colonoscopy and EGD. This is reasonable given her multitude of GI issues. Up will schedule with Dr. Havery Moros. Procedures discussed in detail with patient and she is agreeable to proceed. She will continue daily probiotic She does not want to take an acid blocker at this time, can reassess pending results of EGD  Alfredia Ferguson PA-C 04/23/2015   Cc: Deland Pretty, MD

## 2015-04-23 NOTE — Patient Instructions (Signed)
You have been scheduled for a colonoscopy. Please follow written instructions given to you at your visit today.  Please pick up your prep supplies at the pharmacy within the next 1-3 days. If you use inhalers (even only as needed), please bring them with you on the day of your procedure. Your physician has requested that you go to www.startemmi.com and enter the access code given to you at your visit today. This web site gives a general overview about your procedure. However, you should still follow specific instructions given to you by our office regarding your preparation for the procedure.  We sent a prescription to [facility] for the colonoscopy prep called Suprep.

## 2015-04-24 NOTE — Progress Notes (Signed)
Agree with assessment and plan. Would perform random biopsies of the colon even if normal to rule out microscopic colitis of which there is a higher incidence in celiac patients. Please add celiac ABs prior to endoscopy, this will help assess if her celiac is in remission along with biopsies. thanks

## 2015-04-27 ENCOUNTER — Other Ambulatory Visit: Payer: Self-pay

## 2015-04-27 DIAGNOSIS — K900 Celiac disease: Secondary | ICD-10-CM

## 2015-05-13 ENCOUNTER — Other Ambulatory Visit (INDEPENDENT_AMBULATORY_CARE_PROVIDER_SITE_OTHER): Payer: Medicare Other

## 2015-05-13 ENCOUNTER — Other Ambulatory Visit: Payer: Self-pay | Admitting: Otolaryngology

## 2015-05-13 ENCOUNTER — Telehealth: Payer: Self-pay | Admitting: Cardiology

## 2015-05-13 DIAGNOSIS — D497 Neoplasm of unspecified behavior of endocrine glands and other parts of nervous system: Secondary | ICD-10-CM

## 2015-05-13 DIAGNOSIS — K900 Celiac disease: Secondary | ICD-10-CM

## 2015-05-13 DIAGNOSIS — E785 Hyperlipidemia, unspecified: Secondary | ICD-10-CM

## 2015-05-13 LAB — IGA: IgA: 152 mg/dL (ref 68–378)

## 2015-05-13 NOTE — Telephone Encounter (Signed)
Is wanting to know if Dr. Ellyn Hack can add to her labs to check to see how big the cholesterol Particles . Please call   Thanks

## 2015-05-13 NOTE — Telephone Encounter (Signed)
Pt given OK on this and advised that she can get this test done when she gets CMP on Friday.

## 2015-05-13 NOTE — Telephone Encounter (Signed)
Routed to Dr. Ellyn Hack --- OK for Cardio IQ order?

## 2015-05-13 NOTE — Telephone Encounter (Signed)
I am peripherally fine with changing to cardio IQ  [facility]

## 2015-05-14 LAB — TISSUE TRANSGLUTAMINASE, IGA: TISSUE TRANSGLUTAMINASE AB, IGA: 1 U/mL (ref <4)

## 2015-05-15 DIAGNOSIS — E7800 Pure hypercholesterolemia, unspecified: Secondary | ICD-10-CM | POA: Diagnosis not present

## 2015-05-15 DIAGNOSIS — R413 Other amnesia: Secondary | ICD-10-CM | POA: Diagnosis not present

## 2015-05-15 DIAGNOSIS — Z79899 Other long term (current) drug therapy: Secondary | ICD-10-CM | POA: Diagnosis not present

## 2015-05-15 DIAGNOSIS — E785 Hyperlipidemia, unspecified: Secondary | ICD-10-CM | POA: Diagnosis not present

## 2015-05-16 LAB — COMPREHENSIVE METABOLIC PANEL
ALK PHOS: 87 U/L (ref 33–130)
ALT: 25 U/L (ref 6–29)
AST: 25 U/L (ref 10–35)
Albumin: 4.7 g/dL (ref 3.6–5.1)
BUN: 17 mg/dL (ref 7–25)
CALCIUM: 10 mg/dL (ref 8.6–10.4)
CHLORIDE: 105 mmol/L (ref 98–110)
CO2: 26 mmol/L (ref 20–31)
Creat: 0.86 mg/dL (ref 0.50–0.99)
Glucose, Bld: 99 mg/dL (ref 65–99)
POTASSIUM: 5.1 mmol/L (ref 3.5–5.3)
Sodium: 143 mmol/L (ref 135–146)
Total Bilirubin: 0.6 mg/dL (ref 0.2–1.2)
Total Protein: 7.5 g/dL (ref 6.1–8.1)

## 2015-05-16 LAB — LIPID PANEL
CHOLESTEROL: 292 mg/dL — AB (ref 125–200)
HDL: 86 mg/dL (ref >=46)
LDL CALC: 187 mg/dL — AB (ref <130)
TRIGLYCERIDES: 95 mg/dL (ref <150)
Total CHOL/HDL Ratio: 3.4 Ratio (ref <=5.0)
VLDL: 19 mg/dL (ref <30)

## 2015-05-18 ENCOUNTER — Telehealth: Payer: Self-pay | Admitting: *Deleted

## 2015-05-18 NOTE — Telephone Encounter (Signed)
Left message to call back - lab results  make follow up appoinmtent with pharmacist to discuss options for cholesterol

## 2015-05-18 NOTE — Telephone Encounter (Signed)
-----   Message from Leonie Man, MD sent at 05/17/2015  2:23 PM EST ----- Unfortunately, Cholesterol level is now back out of control without statin. Will need to consider other treatment options. Will forward to Pharmacist for advice on statins with less memory issues.  Leonie Man, MD

## 2015-05-19 LAB — CARDIO IQ(R) ADVANCED LIPID PANEL
APOLIPOPROTEIN (CARDIO IQ ADV LIPID PANEL): 139 mg/dL — AB (ref 49–103)
CHOLESTEROL/HDL RATIO (CARDIO IQ ADV LIPID PANEL): 3.8 calc (ref <=5.0)
Cholesterol, Total: 315 mg/dL — ABNORMAL HIGH (ref 125–200)
HDL CHOLESTEROL (CARDIO IQ ADV LIPID PANEL): 84 mg/dL (ref >=46)
LDL CHOLESTEROL CALCULATED (CARDIO IQ ADV LIPID PANEL): 213 mg/dL — AB
LDL Large: 10829 nmol/L (ref 5038–17886)
LDL Medium: 356 nmol/L (ref 121–397)
LDL Particle Number: 2019 nmol/L (ref 1016–2185)
LDL Peak Size: 228 Angstrom (ref >=218.2)
LDL Small: 207 nmol/L (ref 115–386)
LIPOPROTEIN (A) (CARDIO IQ ADV LIPID PANEL): 28 nmol/L (ref <75)
Non-HDL Cholesterol: 231 mg/dL
TRIGLYCERIDES (CARDIO IQ ADV LIPID PANEL): 92 mg/dL

## 2015-05-19 NOTE — Telephone Encounter (Signed)
Amanda [name] is returning your call about her test results .   Thanks

## 2015-05-19 NOTE — Telephone Encounter (Signed)
Spoke with pt, aware of lab results and dr harding's recommendations. She has been off the statin because she felt it was causing memory issues. She agrees to come in to talk with kristin. [place] and will call once she returns home and get an appt.

## 2015-05-27 NOTE — Telephone Encounter (Signed)
Quick Note:  I am concerned that the lipid panel has gotten worse since stopping atorvastatin -- Zetia is not quite doing it.  Lets try Rosuvastatin 10 mg every other day ---> recheck lipids in 3-4 months. Next option is Prvastatin.  Leonie Man, MD  ______

## 2015-06-02 ENCOUNTER — Telehealth: Payer: Self-pay | Admitting: Cardiology

## 2015-06-02 NOTE — Telephone Encounter (Signed)
Amanda [name] is calling to get the particle results of her lab . Please call   Thanks

## 2015-06-02 NOTE — Telephone Encounter (Signed)
Spoke with pt, she is back on the atorvastatin. She would like to know more information about the particle size. She would like kristin the pharm md to call her to discuss. Will forward to kristin.

## 2015-06-03 MED ORDER — ROSUVASTATIN CALCIUM 10 MG PO TABS: 10.0000 mg | ORAL_TABLET | Freq: Every day | ORAL | Status: DC

## 2015-06-03 NOTE — Telephone Encounter (Signed)
Spoke with patient at length about her cholesterol numbers, she did put herself back on atorvastatin, but is concerned about potential memory loss.  Suggested that we switch to rosuvastatin, as is less lipophilic.  She was on it years ago, probably switched due to high cost.  Patient agreeable to start rosuvastatin 10 mg daily, suggested that based on her LDL if she does well on 10 mg daily we could increase to 20 mg daily if there needs to be further LDL reduction.

## 2015-06-03 NOTE — Addendum Note (Signed)
Addended by: Rockne Menghini on: 06/03/2015 04:31 PM   Modules accepted: Orders

## 2015-06-05 ENCOUNTER — Ambulatory Visit (INDEPENDENT_AMBULATORY_CARE_PROVIDER_SITE_OTHER): Payer: Medicare Other | Admitting: Women's Health

## 2015-06-05 ENCOUNTER — Encounter: Payer: Self-pay | Admitting: Women's Health

## 2015-06-05 DIAGNOSIS — R1031 Right lower quadrant pain: Secondary | ICD-10-CM | POA: Diagnosis not present

## 2015-06-05 NOTE — Progress Notes (Signed)
HPI:  Presents with lower right abdominal pain, thinks it could be her ovary.  History of a painful ovarian cyst many years ago. 1986 TAH for fibroids with appendectomy. States she has had pencil shaped stools off and on for about a month, with abdominal gurgling sensations and sounds. Denies any rectal bleeding, change in color of stool, mucus,  or pain with bowel movements. Had same symptoms last November, including diarrhea, bloating and nausea, seen by her PCP who prescribed Immodium, symptoms resolved.  History of Celiac disease and hiatal hernia for many years, takes essential oils orally that have helped both. Endoscopy/Colonoscopy scheduled for this upcoming Tuesday, 06/09/15. Father history of diverticulitis. Hypertension/hypercholesterolemia/anxiety and depression-primary care manages  Exam:  Appears well. Pelvic and bimanual exam without significant findings.  Speculum exam, no discharge noted No adnexal tenderness, no . No discharge. Tenderness noted in right lower abdomen with palpation above level of the ovary.  Probable GI related issue History of celiac/IBS Rule out an ovarian problem  Plan:  Endoscopy/Colonoscopy on Tuesday.  Eat bland, mild foods. Pelvic ultrasound, if symptoms persist after endoscopy/colonoscopy scheduled.

## 2015-06-09 ENCOUNTER — Encounter: Payer: Self-pay | Admitting: Gastroenterology

## 2015-06-09 ENCOUNTER — Ambulatory Visit (AMBULATORY_SURGERY_CENTER): Payer: Medicare Other | Admitting: Gastroenterology

## 2015-06-09 VITALS — BP 154/92 | HR 65 | Temp 97.9°F | Resp 16 | Ht 66.0 in | Wt 159.0 lb

## 2015-06-09 DIAGNOSIS — K900 Celiac disease: Secondary | ICD-10-CM | POA: Diagnosis not present

## 2015-06-09 DIAGNOSIS — R194 Change in bowel habit: Secondary | ICD-10-CM

## 2015-06-09 DIAGNOSIS — D128 Benign neoplasm of rectum: Secondary | ICD-10-CM | POA: Diagnosis not present

## 2015-06-09 DIAGNOSIS — D129 Benign neoplasm of anus and anal canal: Secondary | ICD-10-CM

## 2015-06-09 DIAGNOSIS — K219 Gastro-esophageal reflux disease without esophagitis: Secondary | ICD-10-CM

## 2015-06-09 DIAGNOSIS — R002 Palpitations: Secondary | ICD-10-CM | POA: Diagnosis not present

## 2015-06-09 DIAGNOSIS — K317 Polyp of stomach and duodenum: Secondary | ICD-10-CM | POA: Diagnosis not present

## 2015-06-09 DIAGNOSIS — I10 Essential (primary) hypertension: Secondary | ICD-10-CM | POA: Diagnosis not present

## 2015-06-09 DIAGNOSIS — D122 Benign neoplasm of ascending colon: Secondary | ICD-10-CM | POA: Diagnosis not present

## 2015-06-09 MED ORDER — SODIUM CHLORIDE 0.9 % IV SOLN: 500.0000 mL | INTRAVENOUS | Status: DC

## 2015-06-09 NOTE — Op Note (Signed)
[place]  [address]. [place], [address]   ENDOSCOPY PROCEDURE REPORT  PATIENT: Amanda [name], Amanda [name]  MR#: [id number] BIRTHDATE: [birth year] , 34  yrs. old GENDER: female ENDOSCOPIST: Yetta Flock, MD REFERRED BY: PROCEDURE DATE:  06/09/2015 PROCEDURE:  EGD w/ biopsy ASA CLASS:     Class II INDICATIONS:  history of celiac disease, change in bowel habits. Celiac ABs normal. MEDICATIONS: Propofol 200 mg IV TOPICAL ANESTHETIC:  DESCRIPTION OF PROCEDURE: After the risks benefits and alternatives of the procedure were thoroughly explained, informed consent was obtained.  The LB [id number] endoscope was introduced through the mouth and advanced to the second portion of the duodenum , Without limitations.  The instrument was slowly withdrawn as the mucosa was fully examined.   FINDINGS: The esophagus was normal.  DH, GEJ, and SCJ located 36cm from the incisors.  A 75m sessile polyp was noted in the gastric body and removed with cold forceps.  The remainder of the examined stomach was normal.  The duodenal bulb had some mild erythema without focal ulcerations or erosions.  The second portion of the duodenum was normal.  Biopsies taken throughout the duodenum for celiac disease.  Retroflexed views revealed no abnormalities. The scope was then withdrawn from the patient and the procedure completed.  COMPLICATIONS: There were no immediate complications.  ENDOSCOPIC IMPRESSION: Normal esophagus Small gastric polyp, removed with cold forceps. Otherwise normal stomach Mild duodenal bulb erythema, normal remainder of examined duodenum. Biopsies taken to assess for celiac disease    RECOMMENDATIONS: Await pathology results Resume gluten free diet Resume medications    eSigned:  SYetta Flock MD 06/09/2015 3:33 PM    CC: the patient  PATIENT NAME:  [name], RosadoMR#: [id number]

## 2015-06-09 NOTE — Patient Instructions (Signed)
NO NSAIDS  (ADVIL, IBUPROFEN, MOTRIN, ALEVE, NAPROSYN  ETC ) FOR TWO WEEKS, FEBRUARY 15,2017.    YOU HAD AN ENDOSCOPIC PROCEDURE TODAY AT [facility]:   Refer to the procedure report that was given to you for any specific questions about what was found during the examination.  If the procedure report does not answer your questions, please call your gastroenterologist to clarify.  If you requested that your care partner not be given the details of your procedure findings, then the procedure report has been included in a sealed envelope for you to review at your convenience later.  YOU SHOULD EXPECT: Some feelings of bloating in the abdomen. Passage of more gas than usual.  Walking can help get rid of the air that was put into your GI tract during the procedure and reduce the bloating. If you had a lower endoscopy (such as a colonoscopy or flexible sigmoidoscopy) you may notice spotting of blood in your stool or on the toilet paper. If you underwent a bowel prep for your procedure, you may not have a normal bowel movement for a few days.  Please Note:  You might notice some irritation and congestion in your nose or some drainage.  This is from the oxygen used during your procedure.  There is no need for concern and it should clear up in a day or so.  SYMPTOMS TO REPORT IMMEDIATELY:   Following lower endoscopy (colonoscopy or flexible sigmoidoscopy):  Excessive amounts of blood in the stool  Significant tenderness or worsening of abdominal pains  Swelling of the abdomen that is new, acute  Fever of 100F or higher   Following upper endoscopy (EGD)  Vomiting of blood or coffee ground material  New chest pain or pain under the shoulder blades  Painful or persistently difficult swallowing  New shortness of breath  Fever of 100F or higher  Black, tarry-looking stools  For urgent or emergent issues, a gastroenterologist can be reached at any hour by calling [phone]  [phone].   DIET: Your first meal following the procedure should be a small meal and then it is ok to progress to your normal diet. Heavy or fried foods are harder to digest and may make you feel nauseous or bloated.  Likewise, meals heavy in dairy and vegetables can increase bloating.  Drink plenty of fluids but you should avoid alcoholic beverages for 24 hours.  ACTIVITY:  You should plan to take it easy for the rest of today and you should NOT DRIVE or use heavy machinery until tomorrow (because of the sedation medicines used during the test).    FOLLOW UP: Our staff will call the number listed on your records the next business day following your procedure to check on you and address any questions or concerns that you may have regarding the information given to you following your procedure. If we do not reach you, we will leave a message.  However, if you are feeling well and you are not experiencing any problems, there is no need to return our call.  We will assume that you have returned to your regular daily activities without incident.  If any biopsies were taken you will be contacted by phone or by letter within the next 1-3 weeks.  Please call us at [phone] if you have not heard about the biopsies in 3 weeks.    SIGNATURES/CONFIDENTIALITY: You and/or your care partner have signed paperwork which will be entered into your electronic medical record.  These signatures attest to the fact that that the information above on your After Visit Summary has been reviewed and is understood.  Full responsibility of the confidentiality of this discharge information lies with you and/or your care-partner.

## 2015-06-09 NOTE — Op Note (Signed)
[place]  [address]. [place], [address]   COLONOSCOPY PROCEDURE REPORT  PATIENT: Amanda [name], Amanda [name]  MR#: [id number] BIRTHDATE: 05-08-1951 , 21  yrs. old GENDER: female ENDOSCOPIST: Yetta Flock, MD REFERRED BY: Deland Pretty MD PROCEDURE DATE:  06/09/2015 PROCEDURE:   Colonoscopy, diagnostic, Colonoscopy, screening, Colonoscopy with snare polypectomy, and Colonoscopy with biopsy First Screening Colonoscopy - Avg.  risk and is 50 yrs.  old or older - No.  Prior Negative Screening - Now for repeat screening. 10 or more years since last screening  History of Adenoma - Now for follow-up colonoscopy & has been > or = to 3 yrs.  N/A  Polyps removed today? Yes ASA CLASS:   Class II INDICATIONS:diarrhea, abdominal pain, screening, average risk. MEDICATIONS: Propofol 250 mg IV  DESCRIPTION OF PROCEDURE:   After the risks benefits and alternatives of the procedure were thoroughly explained, informed consent was obtained.  The digital rectal exam revealed no abnormalities of the rectum.   The LB 1528  endoscope was introduced through the anus and advanced to the terminal ileum which was intubated for a short distance. No adverse events experienced.   The quality of the prep was adequate  The instrument was then slowly withdrawn as the colon was fully examined. Estimated blood loss is zero unless otherwise noted in this procedure report.  COLON FINDINGS: The colon was tortous.  No overt inflammatory changes were noted.  Random biopsies were taken from the right and left colon to rule out microscopic colitis.  The ileum was normal. A flat roughly 8-60m polyp was noted in the ascending colon and removed via cold snare.  A pedunculated 5-632mpolyp was noted along a tight fold in the rectosigmoid colon and removed via hot snare. The remainder of the colon was normal.  hypertrophied anal papillae, small hemorrhoids. The time to cecum = 5.6 Withdrawal time =  17.3   The scope was withdrawn and the procedure completed. COMPLICATIONS: There were no immediate complications.  ENDOSCOPIC IMPRESSION: Tortous colon 2 polyps removed as outlined above No overt inflammatory changes, biopsies taken to rule out microscopic colitis Small internal hemorrhoids with hypertrophied anal papillae  RECOMMENDATIONS: No NSAIDS for 2 weeks Await pathology results Resume diet / medications eSigned:  StYetta FlockMD 06/09/2015 3:28 PM   cc:  WaDeland PrettyD, the patient

## 2015-06-09 NOTE — Progress Notes (Signed)
Called to room to assist during endoscopic procedure.  Patient ID and intended procedure confirmed with present staff. Received instructions for my participation in the procedure from the performing physician.  

## 2015-06-09 NOTE — Progress Notes (Signed)
To PACU  Awake and ALert Report to RN

## 2015-06-10 ENCOUNTER — Telehealth: Payer: Self-pay

## 2015-06-10 DIAGNOSIS — M8580 Other specified disorders of bone density and structure, unspecified site: Secondary | ICD-10-CM

## 2015-06-10 HISTORY — DX: Other specified disorders of bone density and structure, unspecified site: M85.80

## 2015-06-10 NOTE — Telephone Encounter (Signed)
  Follow up Call-  Call back number 06/09/2015  Post procedure Call Back phone  # [phone]  Permission to leave phone message Yes     Patient was called for follow up after procedure on 06/09/2015. No answer at the number given for follow up call. A message was left on the answering machine.

## 2015-06-11 ENCOUNTER — Other Ambulatory Visit: Payer: Self-pay | Admitting: Internal Medicine

## 2015-06-11 ENCOUNTER — Ambulatory Visit
Admission: RE | Admit: 2015-06-11 | Discharge: 2015-06-11 | Disposition: A | Discharge status: Home / Self Care | Payer: Medicare Other | Source: Ambulatory Visit | Attending: Internal Medicine | Admitting: Internal Medicine

## 2015-06-11 DIAGNOSIS — M546 Pain in thoracic spine: Secondary | ICD-10-CM | POA: Diagnosis not present

## 2015-06-11 DIAGNOSIS — R1031 Right lower quadrant pain: Secondary | ICD-10-CM

## 2015-06-11 MED ORDER — IOPAMIDOL (ISOVUE-300) INJECTION 61%
100.0000 mL | Freq: Once | INTRAVENOUS | Status: AC | PRN
  Administered 2015-06-11: 100 mL via INTRAVENOUS

## 2015-06-15 ENCOUNTER — Other Ambulatory Visit: Payer: Self-pay | Admitting: Gynecology

## 2015-06-15 ENCOUNTER — Ambulatory Visit (INDEPENDENT_AMBULATORY_CARE_PROVIDER_SITE_OTHER): Payer: Medicare Other

## 2015-06-15 DIAGNOSIS — M8580 Other specified disorders of bone density and structure, unspecified site: Secondary | ICD-10-CM

## 2015-06-15 DIAGNOSIS — M899 Disorder of bone, unspecified: Secondary | ICD-10-CM

## 2015-06-16 ENCOUNTER — Encounter: Payer: Self-pay | Admitting: Gynecology

## 2015-06-16 DIAGNOSIS — Z0000 Encounter for general adult medical examination without abnormal findings: Secondary | ICD-10-CM | POA: Diagnosis not present

## 2015-06-16 DIAGNOSIS — R350 Frequency of micturition: Secondary | ICD-10-CM | POA: Diagnosis not present

## 2015-06-17 ENCOUNTER — Ambulatory Visit
Admission: RE | Admit: 2015-06-17 | Discharge: 2015-06-17 | Disposition: A | Discharge status: Home / Self Care | Payer: Medicare Other | Source: Ambulatory Visit | Attending: Otolaryngology | Admitting: Otolaryngology

## 2015-06-17 DIAGNOSIS — E042 Nontoxic multinodular goiter: Secondary | ICD-10-CM | POA: Diagnosis not present

## 2015-06-17 DIAGNOSIS — D497 Neoplasm of unspecified behavior of endocrine glands and other parts of nervous system: Secondary | ICD-10-CM

## 2015-06-22 ENCOUNTER — Ambulatory Visit (INDEPENDENT_AMBULATORY_CARE_PROVIDER_SITE_OTHER): Payer: Medicare Other | Admitting: Women's Health

## 2015-06-22 ENCOUNTER — Encounter: Payer: Self-pay | Admitting: Women's Health

## 2015-06-22 ENCOUNTER — Other Ambulatory Visit: Payer: Self-pay | Admitting: Women's Health

## 2015-06-22 ENCOUNTER — Ambulatory Visit (INDEPENDENT_AMBULATORY_CARE_PROVIDER_SITE_OTHER): Payer: Medicare Other

## 2015-06-22 VITALS — BP 132/80 | Ht 66.0 in | Wt 159.0 lb

## 2015-06-22 DIAGNOSIS — R1909 Other intra-abdominal and pelvic swelling, mass and lump: Secondary | ICD-10-CM

## 2015-06-22 DIAGNOSIS — R1031 Right lower quadrant pain: Secondary | ICD-10-CM | POA: Diagnosis not present

## 2015-06-22 DIAGNOSIS — N83201 Unspecified ovarian cyst, right side: Secondary | ICD-10-CM

## 2015-06-22 DIAGNOSIS — N83209 Unspecified ovarian cyst, unspecified side: Secondary | ICD-10-CM | POA: Diagnosis not present

## 2015-06-22 NOTE — Progress Notes (Signed)
Patient ID: Amanda [name], female   DOB: 05-20-1950, 65 y.o.   MRN: [id number] Presents for ultrasound. 1986 TAH for fibroids and appendectomy. Has had persistent right mid abdominal pain for the past month associated with diarrhea, nausea and bloating. History of IBS and celiac disease. 06/09/2015 had a colonoscopy which showed 2 benign polyps. Symptoms did not resolve after colon polypectomy.  Exam: Appears well. Ultrasound: S/P hysterectomy. Right ovary with 2 simple echo-free follicular cyst 6 x 6 mm, 5 x 5 mm. Negative CFD. Left ovary normal echo atrophic. Negative cul-de-sac noted excessive bowel shadowing left adnexal.  Right mid abdominal pain 2 small echo-free follicular cysts/right ovary IBS/celiac disease  Plan: Reviewed follicular cysts most likely not causing discomfort will check a CA-125 but reviewed the cysts are  small and most likely benign. Reviewed IBS most likely reason for abdominal discomfort, will try to eliminate different foods that may be causing problems.

## 2015-06-22 NOTE — Patient Instructions (Signed)
Diet for Irritable Bowel Syndrome When you have irritable bowel syndrome (IBS), the foods you eat and your eating habits are very important. IBS may cause various symptoms, such as abdominal pain, constipation, or diarrhea. Choosing the right foods can help ease discomfort caused by these symptoms. Work with your health care provider and dietitian to find the best eating plan to help control your symptoms. WHAT GENERAL GUIDELINES DO I NEED TO FOLLOW?  Keep a food diary. This will help you identify foods that cause symptoms. Write down:  What you eat and when.  What symptoms you have.  When symptoms occur in relation to your meals.  Avoid foods that cause symptoms. Talk with your dietitian about other ways to get the same nutrients that are in these foods.  Eat more foods that contain fiber. Take a fiber supplement if directed by your dietitian.  Eat your meals slowly, in a relaxed setting.  Aim to eat 5-6 small meals per day. Do not skip meals.  Drink enough fluids to keep your urine clear or pale yellow.  Ask your health care provider if you should take an over-the-counter probiotic during flare-ups to help restore healthy gut bacteria.  If you have cramping or diarrhea, try making your meals low in fat and high in carbohydrates. Examples of carbohydrates are pasta, rice, whole grain breads and cereals, fruits, and vegetables.  If dairy products cause your symptoms to flare up, try eating less of them. You might be able to handle yogurt better than other dairy products because it contains bacteria that help with digestion. WHAT FOODS ARE NOT RECOMMENDED? The following are some foods and drinks that may worsen your symptoms:  Fatty foods, such as [place] fries.  Milk products, such as cheese or ice cream.  Chocolate.  Alcohol.  Products with caffeine, such as coffee.  Carbonated drinks, such as soda. The items listed above may not be a complete list of foods and beverages to  avoid. Contact your dietitian for more information. WHAT FOODS ARE GOOD SOURCES OF FIBER? Your health care provider or dietitian may recommend that you eat more foods that contain fiber. Fiber can help reduce constipation and other IBS symptoms. Add foods with fiber to your diet a little at a time so that your body can get used to them. Too much fiber at once might cause gas and swelling of your abdomen. The following are some foods that are good sources of fiber:  Apples.  Peaches.  Pears.  Berries.  Figs.  Broccoli (raw).  Cabbage.  Carrots.  Raw peas.  Kidney beans.  Lima beans.  Whole grain bread.  Whole grain cereal. FOR MORE INFORMATION  International Foundation for Functional Gastrointestinal Disorders: www.iffgd.[employer] of Diabetes and Digestive and Kidney Diseases: NetworkAffair.co.za.aspx   This information is not intended to replace advice given to you by your health care provider. Make sure you discuss any questions you have with your health care provider.   Document Released: 07/16/2003 Document Revised: 05/16/2014 Document Reviewed: 07/26/2013 Elsevier Interactive Patient Education [employer].

## 2015-06-23 LAB — CA 125: CA 125: 5 U/mL (ref <35)

## 2015-06-24 DIAGNOSIS — R918 Other nonspecific abnormal finding of lung field: Secondary | ICD-10-CM | POA: Diagnosis not present

## 2015-07-27 ENCOUNTER — Telehealth: Payer: Self-pay | Admitting: Cardiology

## 2015-07-27 NOTE — Telephone Encounter (Signed)
Pt notes she took herself off Crestor very soon after starting it. She had episodes of forgetfulness, felt very "out of it".  She recognizes the need to take a statin.  She notes she resumed her historic dose of atorvastatin 46m, but at every other day, not daily.  We discussed need for some kind of statin coverage since Zetia had not been sufficient in keeping her cholesterol numbers in check.  She states she would be fine keeping atorva at current dose, seems to be working w/ minimal or no SE's.  Informed pt to remain on schedule for the lipid recheck next month. Informed her I would send to Dr. HEllyn Hackfor any further considerations at this time.  Pt amenable to "letting things lie" as they are right now. Informed her to call if new concerns.

## 2015-07-27 NOTE — Telephone Encounter (Signed)
Amanda [name] is calling because she has been on Crestor and that was not making her feel well, so she went back to taken her Atorvastatin . Would like to speak to a Nurse..  Thanks

## 2015-07-29 DIAGNOSIS — H04123 Dry eye syndrome of bilateral lacrimal glands: Secondary | ICD-10-CM | POA: Diagnosis not present

## 2015-08-31 DIAGNOSIS — M542 Cervicalgia: Secondary | ICD-10-CM | POA: Diagnosis not present

## 2015-08-31 DIAGNOSIS — M5136 Other intervertebral disc degeneration, lumbar region: Secondary | ICD-10-CM | POA: Diagnosis not present

## 2015-08-31 DIAGNOSIS — M25561 Pain in right knee: Secondary | ICD-10-CM | POA: Diagnosis not present

## 2015-08-31 DIAGNOSIS — M5441 Lumbago with sciatica, right side: Secondary | ICD-10-CM | POA: Diagnosis not present

## 2015-09-03 DIAGNOSIS — I10 Essential (primary) hypertension: Secondary | ICD-10-CM | POA: Diagnosis not present

## 2015-09-03 DIAGNOSIS — E7800 Pure hypercholesterolemia, unspecified: Secondary | ICD-10-CM | POA: Diagnosis not present

## 2015-09-03 DIAGNOSIS — Z0000 Encounter for general adult medical examination without abnormal findings: Secondary | ICD-10-CM | POA: Diagnosis not present

## 2015-09-03 DIAGNOSIS — E559 Vitamin D deficiency, unspecified: Secondary | ICD-10-CM | POA: Diagnosis not present

## 2015-09-03 LAB — CBC AND DIFFERENTIAL
HCT: 37 % (ref 36–46)
Hemoglobin: 11.9 g/dL — AB (ref 12.0–16.0)
Platelets: 223 10*3/uL (ref 150–399)
WBC: 4.8 10^3/mL

## 2015-09-03 LAB — VITAMIN D 25 HYDROXY (VIT D DEFICIENCY, FRACTURES): Vit D, 25-Hydroxy: 33.4

## 2015-09-03 LAB — TSH: TSH: 0.9 u[IU]/mL (ref <=5.90)

## 2015-09-09 DIAGNOSIS — F419 Anxiety disorder, unspecified: Secondary | ICD-10-CM | POA: Diagnosis not present

## 2015-09-09 DIAGNOSIS — E7800 Pure hypercholesterolemia, unspecified: Secondary | ICD-10-CM | POA: Diagnosis not present

## 2015-09-09 DIAGNOSIS — M5441 Lumbago with sciatica, right side: Secondary | ICD-10-CM | POA: Diagnosis not present

## 2015-09-09 DIAGNOSIS — G4700 Insomnia, unspecified: Secondary | ICD-10-CM | POA: Diagnosis not present

## 2015-09-10 DIAGNOSIS — D485 Neoplasm of uncertain behavior of skin: Secondary | ICD-10-CM | POA: Diagnosis not present

## 2015-09-10 DIAGNOSIS — C44311 Basal cell carcinoma of skin of nose: Secondary | ICD-10-CM | POA: Diagnosis not present

## 2015-09-14 ENCOUNTER — Other Ambulatory Visit: Payer: Self-pay | Admitting: Cardiology

## 2015-09-14 NOTE — Telephone Encounter (Signed)
Rx Refill

## 2015-09-15 ENCOUNTER — Encounter: Payer: Self-pay | Admitting: Cardiology

## 2015-09-15 DIAGNOSIS — M5441 Lumbago with sciatica, right side: Secondary | ICD-10-CM | POA: Diagnosis not present

## 2015-09-18 DIAGNOSIS — M5441 Lumbago with sciatica, right side: Secondary | ICD-10-CM | POA: Diagnosis not present

## 2015-09-22 DIAGNOSIS — M5441 Lumbago with sciatica, right side: Secondary | ICD-10-CM | POA: Diagnosis not present

## 2015-10-09 DIAGNOSIS — M5441 Lumbago with sciatica, right side: Secondary | ICD-10-CM | POA: Diagnosis not present

## 2015-10-12 DIAGNOSIS — Z85828 Personal history of other malignant neoplasm of skin: Secondary | ICD-10-CM | POA: Diagnosis not present

## 2015-10-12 DIAGNOSIS — C44311 Basal cell carcinoma of skin of nose: Secondary | ICD-10-CM | POA: Diagnosis not present

## 2015-10-22 DIAGNOSIS — M1712 Unilateral primary osteoarthritis, left knee: Secondary | ICD-10-CM | POA: Diagnosis not present

## 2015-10-22 DIAGNOSIS — M170 Bilateral primary osteoarthritis of knee: Secondary | ICD-10-CM | POA: Diagnosis not present

## 2015-10-22 DIAGNOSIS — S82001D Unspecified fracture of right patella, subsequent encounter for closed fracture with routine healing: Secondary | ICD-10-CM | POA: Diagnosis not present

## 2015-10-22 DIAGNOSIS — M1711 Unilateral primary osteoarthritis, right knee: Secondary | ICD-10-CM | POA: Diagnosis not present

## 2015-11-16 DIAGNOSIS — D2261 Melanocytic nevi of right upper limb, including shoulder: Secondary | ICD-10-CM | POA: Diagnosis not present

## 2015-11-16 DIAGNOSIS — D2271 Melanocytic nevi of right lower limb, including hip: Secondary | ICD-10-CM | POA: Diagnosis not present

## 2015-11-16 DIAGNOSIS — D2262 Melanocytic nevi of left upper limb, including shoulder: Secondary | ICD-10-CM | POA: Diagnosis not present

## 2015-11-16 DIAGNOSIS — L812 Freckles: Secondary | ICD-10-CM | POA: Diagnosis not present

## 2015-11-16 DIAGNOSIS — D225 Melanocytic nevi of trunk: Secondary | ICD-10-CM | POA: Diagnosis not present

## 2015-11-16 DIAGNOSIS — D1801 Hemangioma of skin and subcutaneous tissue: Secondary | ICD-10-CM | POA: Diagnosis not present

## 2015-11-16 DIAGNOSIS — D2272 Melanocytic nevi of left lower limb, including hip: Secondary | ICD-10-CM | POA: Diagnosis not present

## 2015-11-16 DIAGNOSIS — L821 Other seborrheic keratosis: Secondary | ICD-10-CM | POA: Diagnosis not present

## 2015-11-16 DIAGNOSIS — Z85828 Personal history of other malignant neoplasm of skin: Secondary | ICD-10-CM | POA: Diagnosis not present

## 2015-12-08 ENCOUNTER — Other Ambulatory Visit: Payer: Self-pay | Admitting: Gynecology

## 2015-12-08 NOTE — Telephone Encounter (Signed)
History of herpes labialis. Uses Valtrex as needed. Will call if she needs supply

## 2015-12-11 ENCOUNTER — Encounter: Payer: Self-pay | Admitting: Neurology

## 2015-12-11 ENCOUNTER — Ambulatory Visit (INDEPENDENT_AMBULATORY_CARE_PROVIDER_SITE_OTHER): Payer: Medicare Other | Admitting: Neurology

## 2015-12-11 VITALS — BP 140/70 | HR 76 | Ht 65.0 in | Wt 161.0 lb

## 2015-12-11 DIAGNOSIS — R413 Other amnesia: Secondary | ICD-10-CM

## 2015-12-11 DIAGNOSIS — M5481 Occipital neuralgia: Secondary | ICD-10-CM | POA: Diagnosis not present

## 2015-12-11 NOTE — Patient Instructions (Signed)
1.  We will check a B12 level 2.  We will schedule you for a neuropsychological evaluation to test for any cognitive issues that need to be addressed 3.  Follow up afterwards.

## 2015-12-11 NOTE — Progress Notes (Signed)
Chart forwarded Dr. Concha Pyo.

## 2015-12-11 NOTE — Progress Notes (Signed)
NEUROLOGY FOLLOW UP OFFICE NOTE  Amanda [name] [id number]  HISTORY OF PRESENT ILLNESS: Amanda [name] is a 65 year old right-handed female with Celiac disease, RLS, osteoarthrosis and history of subdural hematoma and thyroid cancer whom I previously saw for BPPV and memory follows up again for memory as well as headache.   UPDATE: She feels that her memory has gotten a little worse over the past 2 months.  She has increased difficulty with word-retrieval.  One night, she kept forgetting information that a friend told her and repeatedly needed to be reminded.  Over the past 6 months, she has had episodes of electric pain shooting up from the right occipital region.  It is intermittent and not frequent.  She does go to a massage therapist.  In March, she was walking the dog when suddenly she started walking to the left in a circle and couldn't stop.  She was unable to stop until she finished the circle.  She has not had any recurrent event.  HISTORY: In 2010, she sustained a traumatic subdural hematoma.  In late-August 2016, she bent forward and experienced spinning sensation when she stood up.  It lasted a few seconds and resolved.   On 01/21/15, she developed a left sided pressure-like headache.  She went to the ED for further evaluation. MRI of the brain with and without contrast showed sequelae of prior left craniotomy but no acute abnormality.  CBC, BMP and UA were unremarkable.  EKG showed normal sinus rhythm.  She was discharged with meclizine.  She has been doing well.   She also reports short term memory problems for a while.  Sometimes she may repeat something or briefly get confused with directions while driving.  It only occurs once in a while.  Her grandmother had dementia.  No cognitive impairment was appreciated on exam.  MMSE from 03/11/15 was 29.   PAST MEDICAL HISTORY: Past Medical History:  Diagnosis Date  . Bruises easily   . Celiac disease    Gluten free diet    . Change in voice   . Elevated cholesterol   . GERD (gastroesophageal reflux disease)   . Hiatal hernia   . Insomnia   . Myalgia and myositis, unspecified   . Osteoarthrosis, unspecified whether generalized or localized, unspecified site   . Osteopenia 06/2015   T score -2.1 FRAX 9.4%/1.3% stable from prior DEXA  . Other abnormal blood chemistry   . Other symptoms involving cardiovascular system   . Reflux   . Restless leg syndrome   . STD (sexually transmitted disease)    HSV  . Thyroid cancer ([place])   . Vertigo     MEDICATIONS: Current Outpatient Prescriptions on File Prior to Visit  Medication Sig Dispense Refill  . APPLE CIDER VINEGAR PO Take 1 capsule by mouth 2 (two) times daily. Reported on 06/09/2015    . Atorvastatin Calcium (LIPITOR PO) Take 20 mg by mouth every other day.     [name] CALCIUM-VITAMIN D PO Take 2 tablets by mouth daily.    . clonazePAM (KLONOPIN) 1 MG tablet Take 0.5 mg by mouth at bedtime.    . CO ENZYME Q-10 PO Take by mouth.    . cycloSPORINE (RESTASIS) 0.05 % ophthalmic emulsion Place 1 drop into both eyes 2 (two) times daily.    [name] escitalopram (LEXAPRO) 10 MG tablet Take 10 mg by mouth daily.  11  . magnesium citrate SOLN Take 1 Bottle by mouth once.    [name]  metoprolol succinate (TOPROL-XL) 25 MG 24 hr tablet TAKE 1 TABLET BY MOUTH IN THE EVENING MAY TAKE AN ADDITIONAL DOSE WITH ONSET OF PALPITATIONS 30 tablet 8  . Multiple Vitamin (MULTIVITAMIN) tablet Take 1 tablet by mouth daily.      [name] Propylene Glycol (SYSTANE BALANCE) 0.6 % SOLN Apply 1 drop to eye 2 (two) times daily. For dry eyes, takes along with restasis    . rOPINIRole (REQUIP) 0.25 MG tablet Take 0.25 mg by mouth at bedtime.    . valACYclovir (VALTREX) 500 MG tablet TAKE 1 TABLET BY MOUTH EVERY DAY 90 tablet 0  . VITAMIN D, CHOLECALCIFEROL, PO Take 1 tablet by mouth daily.     . [DISCONTINUED] ezetimibe (ZETIA) 10 MG tablet Take 1 tablet (10 mg total) by mouth daily. 30 tablet 11   No current  facility-administered medications on file prior to visit.     ALLERGIES: Allergies  Allergen Reactions  . Ambien [Zolpidem Tartrate] Other (See Comments)    Causes Neurological problems with very bad dizziness, pains, disorientation  . Cortisone Other (See Comments)    REACTION: "mania" post ESI  . Eszopiclone And Related Other (See Comments)    Causes Neurological problems with very bad dizziness, pains, disorientation  . Levofloxacin Other (See Comments)    Muscle spasms and jerking movements  . Dicloxacillin Other (See Comments)    Stomach problems, swelling of tongue  . Dilantin [Phenytoin Sodium Extended] Hives and Itching  . Erythromycin Other (See Comments)    Tears stomach up  . Avelox [Moxifloxacin Hcl In Nacl]   . Other     Celiac disease  . Nitrofuran Derivatives Other (See Comments)    unknown  . Phenytoin Sodium Extended Rash  . Trazodone And Nefazodone Other (See Comments)    unknown    FAMILY HISTORY: Family History  Problem Relation Age of Onset  . Diabetes Maternal Aunt   . Breast cancer Maternal Aunt     Maternal Great aunt-Age 80's  . Liver cancer Father   . Heart disease Maternal Grandfather   . Hyperlipidemia Maternal Grandfather   . Hypertension Paternal Grandfather   . Colon cancer Neg Hx     SOCIAL HISTORY: Social History   Social History  . Marital status: Married    Spouse name: N/A  . Number of children: 2  . Years of education: N/A   Occupational History  . Not on file.   Social History Main Topics  . Smoking status: Former Research scientist (life sciences)  . Smokeless tobacco: Never Used  . Alcohol use 0.6 oz/week    1 Standard drinks or equivalent per week     Comment: very little  . Drug use: No  . Sexual activity: Yes    Birth control/ protection: Surgical     Comment: HYST-1st intercourse 65 yo-Fewer than 5 partners   Other Topics Concern  . Not on file   Social History Narrative   Married mother of 2 with no grandchildren.   Never smoked.     Angulatory and active. 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   Pt lives with husband in a three story home, no issue with stairs. Highest level of education is an associates.     REVIEW OF SYSTEMS: Constitutional: No fevers, chills, or sweats, no generalized fatigue, change in appetite Eyes: No visual changes, double vision, eye pain Ear, nose and throat: No hearing loss, ear pain, nasal congestion, sore throat Cardiovascular:  No chest pain, palpitations Respiratory:  No shortness of breath at rest or with exertion, wheezes GastrointestinaI: No nausea, vomiting, diarrhea, abdominal pain, fecal incontinence Genitourinary:  No dysuria, urinary retention or frequency Musculoskeletal:  No neck pain, back pain Integumentary: No rash, pruritus, skin lesions Neurological: as above Psychiatric: No depression, insomnia, anxiety Endocrine: No palpitations, fatigue, diaphoresis, mood swings, change in appetite, change in weight, increased thirst Hematologic/Lymphatic:  No purpura, petechiae. Allergic/Immunologic: no itchy/runny eyes, nasal congestion, recent allergic reactions, rashes  PHYSICAL EXAM: Vitals:   12/11/15 1406  BP: 140/70  Pulse: 76   General: No acute distress.  Patient appears well-groomed.  normal body habitus. Head:  Normocephalic/atraumatic.  Right suboccipital tenderness to palpation Eyes:  Fundi examined but not visualized Neck: supple, no paraspinal tenderness, full range of motion Heart:  Regular rate and rhythm Lungs:  Clear to auscultation bilaterally Back: No paraspinal tenderness Neurological Exam: alert and oriented to person, place, and time. Attention span and concentration intact, recent and remote memory intact, fund of knowledge intact.  Speech fluent and not dysarthric, language intact.   MMSE - Mini Mental State Exam 12/11/2015 03/11/2015  Orientation to time 5 5  Orientation to Place 5  5  Registration 3 3  Attention/ Calculation 5 4  Recall 3 3  Language- name 2 objects 2 2  Language- repeat 1 1  Language- follow 3 step command 3 3  Language- read & follow direction 1 1  Write a sentence 1 1  Copy design 1 1  Total score 30 29   CN II-XII intact. Bulk and tone normal, muscle strength 5/5 throughout.  Sensation to light touch, temperature and vibration intact.  Deep tendon reflexes 2+ throughout, toes downgoing.  Finger to nose and heel to shin testing intact.  Gait normal, Romberg negative.  IMPRESSION & PLAN: 1.  Memory deficits.  No cognitive impairment appreciated on exam.  However, she is concerned, so we will pursue neuropsychological testing.  She does have her thyroid function tested periodically since her thyroid cancer and that is reportedly normal.  She takes sublingual B12 but will still check a B12 level. 2.  Right sided occipital neuralgia.  It is not frequent, so we won't pursue any management except for continued neck massage. 3.  Follow up after testing  25 minutes spent face to face with patient, over 50% spent counseling.   Metta Clines, DO

## 2015-12-21 ENCOUNTER — Ambulatory Visit (INDEPENDENT_AMBULATORY_CARE_PROVIDER_SITE_OTHER): Payer: Medicare Other | Admitting: Psychology

## 2015-12-21 ENCOUNTER — Encounter: Payer: Self-pay | Admitting: Psychology

## 2015-12-21 ENCOUNTER — Ambulatory Visit: Payer: Medicare Other

## 2015-12-21 ENCOUNTER — Other Ambulatory Visit (INDEPENDENT_AMBULATORY_CARE_PROVIDER_SITE_OTHER): Payer: Medicare Other

## 2015-12-21 DIAGNOSIS — R413 Other amnesia: Secondary | ICD-10-CM

## 2015-12-21 LAB — VITAMIN B12: Vitamin B-12: >1500 pg/mL — ABNORMAL HIGH (ref 211–911)

## 2015-12-21 NOTE — Progress Notes (Signed)
NEUROPSYCHOLOGICAL INTERVIEW (CPT: [id number])  Name: Amanda [name] Date of Birth: 1950/08/26 Date of Interview: 12/21/2015  Reason for Referral:  Amanda [name] is a 65 y.o., right-handed, married female who is referred for neuropsychological evaluation by Dr. Metta Clines of [facility] due to concerns about memory decline. This patient is unaccompanied at today's appointment.   History of Presenting Problem:  Amanda [name] prefaced her current cognitive concerns with the fact that she is a self-described "HSP: highly sensitive person" and therefore tends to worry more about symptoms than others might. She reported that she has started worrying that her cognitive symptoms could mean she has early stage Alzheimer's disease, but she also realizes that she may be too sensitive to her symptoms and this may just be normal aging.  The patient reported mild forgetfulness/attention lapses in everyday life (e.g., going into a room and forgetting what she intended to retrieve there; forgetting an item she intended to bring with her; frequently looking for her cell phone; difficulty retrieving names of people she does not see frequently). Additionally, the patient reported one episode in a recent social situation wherein she kept forgetting something a friend had just told her, and needed to be reminded several times.   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 Interestingly, the patient had taken a fall earlier that month but had not hit her head. A few weeks afterward, her temple felt as if it was going to explode, and she went to her PCP who had her undergo MRI which found the subdural hematoma, and she was admitted for emergency neurosurgery.   The patient denied any head injury since the subdural hematoma in late 2010. However, she did have another fall in August 2016 when she was making a bed. She apparently was having  dizziness around that time. Most recent MRI of the brain was in September 2016 and reportedly showed no acute intracranial process. Postoperative changes from prior left craniotomy were visualized. Otherwise it was a normal brain MRI.   Also of note, the patient reported one recent episode wherein she was walking her dog in the park and all of a sudden "couldn't stop going left". She reported that she walked in a circle and was then able to walk straight. She denied any other symptoms at the time. This was a few months ago and it has not recurred.  The patient manages all instrumental ADLs, including driving, medications, bills/finances, cooking and appointments without any reported difficulty.   The patient denies any mood disturbance. She reported that she has been taking Lexapro for a long time; it was initially prescribed for moodiness during PMS. She reports some situational anxiety but nothing significant. She denied trouble with sleep or appetite. She exercises regularly, leads an active lifestyle, has a healthy diet, and is very socially engaged. She would like to start volunteering again.   Social History: Education: Geophysicist/field seismologist.  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 She does not really do any work for [employer] anymore. 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  Marital history: Married x39 years with two adult sons Alcohol/Tobacco/Substances: One beer a night on average. Never really a smoker (only at 65yo). No substance abuse/dep.    Medical History: Past Medical History:  Diagnosis Date  . Bruises easily   . Celiac disease    Gluten free diet  . Change in voice   . Elevated cholesterol   . GERD (gastroesophageal reflux disease)   . Hiatal hernia   . Insomnia   . Myalgia and myositis,  unspecified   . Osteoarthrosis, unspecified whether generalized or localized, unspecified site   . Osteopenia 06/2015   T score -2.1 FRAX 9.4%/1.3% stable from prior DEXA  . Other abnormal blood chemistry   . Other symptoms involving cardiovascular system   . Reflux   . Restless leg syndrome   . STD (sexually transmitted disease)    HSV  . Thyroid cancer ([place])   . Vertigo     Current Medications:  Outpatient Encounter Prescriptions as of 12/21/2015  Medication Sig  . APPLE CIDER VINEGAR PO Take 1 capsule by mouth 2 (two) times daily. Reported on 06/09/2015  . Atorvastatin Calcium (LIPITOR PO) Take 20 mg by mouth every other day.   [name] CALCIUM-VITAMIN D PO Take 2 tablets by mouth daily.  . clonazePAM (KLONOPIN) 1 MG tablet Take 0.5 mg by mouth at bedtime.  . CO ENZYME Q-10 PO Take by mouth.  . cycloSPORINE (RESTASIS) 0.05 % ophthalmic emulsion Place 1 drop into both eyes 2 (two) times daily.  [name] escitalopram (LEXAPRO) 10 MG tablet Take 10 mg by mouth daily.  . magnesium citrate SOLN Take 1 Bottle by mouth once.  . metoprolol succinate (TOPROL-XL) 25 MG 24 hr tablet TAKE 1 TABLET BY MOUTH IN THE EVENING MAY TAKE AN ADDITIONAL DOSE WITH ONSET OF PALPITATIONS  . Multiple Vitamin (MULTIVITAMIN) tablet Take 1 tablet by mouth daily.    [name] Propylene Glycol (SYSTANE BALANCE) 0.6 % SOLN Apply 1 drop to eye 2 (two) times daily. For dry eyes, takes along with restasis  . rOPINIRole (REQUIP) 0.25 MG tablet Take 0.25 mg by mouth at bedtime.  . valACYclovir (VALTREX) 500 MG tablet TAKE 1 TABLET BY MOUTH EVERY DAY  . VITAMIN D, CHOLECALCIFEROL, PO Take 1 tablet by mouth daily.    No facility-administered encounter medications on file as of 12/21/2015.     Behavioral Observations:   Appearance: Neat, casually and appropriately dressed Gait: Ambulated independently, no abnormalities observed Speech: Fluent; normal rate, rhythm and volume. Mild word finding difficulty. Thought process: Linear, goal  directed Affect: Full, euthymic Interpersonal: Very pleasant, appropriate   TESTING: The patient declined neuropsychological testing at this time, secondary to anxiety and not feeling it is necessary at this time. She would prefer to wait until next year.    PLAN: The patient will follow up with Dr. Tomi Likens, and she will consider undergoing neuropsychological testing next year.  She was encouraged to continue engaging in activities that provide cardiovascular exercise, mental stimulation, and social interaction.

## 2016-02-16 ENCOUNTER — Encounter: Payer: Medicare Other | Admitting: Psychology

## 2016-02-16 ENCOUNTER — Other Ambulatory Visit: Payer: Self-pay | Admitting: Gynecology

## 2016-02-16 DIAGNOSIS — Z1231 Encounter for screening mammogram for malignant neoplasm of breast: Secondary | ICD-10-CM

## 2016-02-19 ENCOUNTER — Ambulatory Visit (INDEPENDENT_AMBULATORY_CARE_PROVIDER_SITE_OTHER): Payer: Medicare Other

## 2016-02-19 ENCOUNTER — Ambulatory Visit (INDEPENDENT_AMBULATORY_CARE_PROVIDER_SITE_OTHER): Payer: Medicare Other | Admitting: Urgent Care

## 2016-02-19 VITALS — BP 116/72 | HR 74 | Temp 98.4°F | Resp 18 | Ht 65.0 in | Wt 158.0 lb

## 2016-02-19 DIAGNOSIS — J069 Acute upper respiratory infection, unspecified: Secondary | ICD-10-CM

## 2016-02-19 DIAGNOSIS — R059 Cough, unspecified: Secondary | ICD-10-CM

## 2016-02-19 DIAGNOSIS — R0602 Shortness of breath: Secondary | ICD-10-CM

## 2016-02-19 DIAGNOSIS — R0982 Postnasal drip: Secondary | ICD-10-CM | POA: Diagnosis not present

## 2016-02-19 DIAGNOSIS — R05 Cough: Secondary | ICD-10-CM | POA: Diagnosis not present

## 2016-02-19 DIAGNOSIS — B9789 Other viral agents as the cause of diseases classified elsewhere: Secondary | ICD-10-CM | POA: Diagnosis not present

## 2016-02-19 DIAGNOSIS — J029 Acute pharyngitis, unspecified: Secondary | ICD-10-CM

## 2016-02-19 MED ORDER — HYDROCODONE-HOMATROPINE 5-1.5 MG/5ML PO SYRP: 5.0000 mL | ORAL_SOLUTION | Freq: Every evening | ORAL | 0 refills | Status: DC | PRN

## 2016-02-19 MED ORDER — BENZONATATE 100 MG PO CAPS: 100.0000 mg | ORAL_CAPSULE | Freq: Three times a day (TID) | ORAL | 0 refills | Status: DC | PRN

## 2016-02-19 NOTE — Progress Notes (Signed)
By signing my name below, I, Raven Small, attest that this documentation has been prepared under the direction and in the presence of Rosario Adie, PA-C.  Electronically Signed: Thea Alken, ED Scribe. 02/19/2016. 4:18 PM.  MRN: [id number] DOB: 03/04/1951  Subjective:   Amanda [name] is a 65 y.o. female presenting for chief complaint of Sore Throat and Fatigue  Pt reports 1 week hx of a gradually worsening sore throat. Symptoms started with allergy symptoms consist of sneezing and post nasal drip that progressed to burning sore throat. She reports associated dry cough, wheezing and fatigue. She's tried claritin and sudafed without relief to symptoms as well as Essential oils. She denies fever, chills, CP, SOB, nausea, emesis, abdominal pain and rash. Pt has hx of tonsillectomy. Pt is not a smoker.      Also is allergic to [employer] tartrate]; cortisone; eszopiclone and related; levofloxacin; dicloxacillin; dilantin [phenytoin sodium extended]; erythromycin; avelox [moxifloxacin hcl in nacl]; other; nitrofuran derivatives; phenytoin sodium extended; and trazodone and nefazodone.  Amanda [name]  has a past medical history of Bruises easily; Celiac disease; Change in voice; Elevated cholesterol; GERD (gastroesophageal reflux disease); Hiatal hernia; Insomnia; Myalgia and myositis, unspecified; Osteoarthrosis, unspecified whether generalized or localized, unspecified site; Osteopenia (06/2015); Other abnormal blood chemistry; Other symptoms involving cardiovascular system; Reflux; Restless leg syndrome; STD (sexually transmitted disease); Thyroid cancer ([place]); and Vertigo. Also  has a past surgical history that includes Knee surgery (2002); Thyroid surgery; Abdominal hysterectomy (1986); Breast surgery (1999); Brain surgery (2010); [facility] MYOVIEW LTD (November 2012); transthoracic echocardiogram ( April 2013; 01/18/2013); Colonoscopy; and Esophagogastroduodenoscopy.  Objective:   Vitals: BP 116/72 (BP  Location: Right Arm, Patient Position: Sitting, Cuff Size: Small)    Pulse 74    Temp 98.4 F (36.9 C) (Oral)    Resp 18    Ht 5' 5"  (1.651 m)    Wt 158 lb (71.7 kg)    SpO2 97%    BMI 26.29 kg/m   Physical Exam  Constitutional: She is oriented to person, place, and time. She appears well-developed and well-nourished.  HENT:  Nose: Right sinus exhibits no maxillary sinus tenderness and no frontal sinus tenderness. Left sinus exhibits no maxillary sinus tenderness and no frontal sinus tenderness.  Postnasal drainage in cobblestone pattern.   Cardiovascular: Normal rate, regular rhythm, normal heart sounds and intact distal pulses.  Exam reveals no gallop and no friction rub.   No murmur heard. Pulmonary/Chest: Effort normal and breath sounds normal. No respiratory distress. She has no wheezes. She has no rales.  Neurological: She is alert and oriented to person, place, and time.   Dg Chest 2 View  Result Date: 02/19/2016 CLINICAL DATA:  Shortness of breath with cough EXAM: CHEST  2 VIEW COMPARISON:  June 24, 2015 FINDINGS: There is no appreciable edema or consolidation. The heart size and pulmonary vascularity are normal. No adenopathy. There is atherosclerotic calcification in the aorta. There is mild mid thoracic dextroscoliosis. IMPRESSION: No edema or consolidation.  There is aortic atherosclerosis. Electronically Signed   By: Lowella Grip III M.D.   On: 02/19/2016 16:38    Assessment and Plan :   1. Viral URI with cough 2. Sore throat 3. Post-nasal drainage 4. Cough 5. Shortness of breath - Advised supportive care, hycodan and tessalon for cough suppression and throat relief. Recheck in 3 days if no improvement, consider cbc, antibiotic course to address pharyngitis versus lower respiratory infection. Patient agreed.  Jaynee Eagles, PA-C [facility]  [facility] [phone] 02/19/2016 4:18 PM

## 2016-02-19 NOTE — Patient Instructions (Addendum)
Upper Respiratory Infection, Adult Most upper respiratory infections (URIs) are a viral infection of the air passages leading to the lungs. A URI affects the nose, throat, and upper air passages. The most common type of URI is nasopharyngitis and is typically referred to as "the common cold." URIs run their course and usually go away on their own. Most of the time, a URI does not require medical attention, but sometimes a bacterial infection in the upper airways can follow a viral infection. This is called a secondary infection. Sinus and middle ear infections are common types of secondary upper respiratory infections. Bacterial pneumonia can also complicate a URI. A URI can worsen asthma and chronic obstructive pulmonary disease (COPD). Sometimes, these complications can require emergency medical care and may be life threatening.  CAUSES Almost all URIs are caused by viruses. A virus is a type of germ and can spread from one person to another.  RISKS FACTORS You may be at risk for a URI if:   You smoke.   You have chronic heart or lung disease.  You have a weakened defense (immune) system.   You are very young or very old.   You have nasal allergies or asthma.  You work in crowded or poorly ventilated areas.  You work in health care facilities or schools. SIGNS AND SYMPTOMS  Symptoms typically develop 2-3 days after you come in contact with a cold virus. Most viral URIs last 7-10 days. However, viral URIs from the influenza virus (flu virus) can last 14-18 days and are typically more severe. Symptoms may include:   Runny or stuffy (congested) nose.   Sneezing.   Cough.   Sore throat.   Headache.   Fatigue.   Fever.   Loss of appetite.   Pain in your forehead, behind your eyes, and over your cheekbones (sinus pain).  Muscle aches.  DIAGNOSIS  Your health care provider may diagnose a URI by:  Physical exam.  Tests to check that your symptoms are not due to  another condition such as:  Strep throat.  Sinusitis.  Pneumonia.  Asthma. TREATMENT  A URI goes away on its own with time. It cannot be cured with medicines, but medicines may be prescribed or recommended to relieve symptoms. Medicines may help:  Reduce your fever.  Reduce your cough.  Relieve nasal congestion. HOME CARE INSTRUCTIONS   Take medicines only as directed by your health care provider.   Gargle warm saltwater or take cough drops to comfort your throat as directed by your health care provider.  Use a warm mist humidifier or inhale steam from a shower to increase air moisture. This may make it easier to breathe.  Drink enough fluid to keep your urine clear or pale yellow.   Eat soups and other clear broths and maintain good nutrition.   Rest as needed.   Return to work when your temperature has returned to normal or as your health care provider advises. You may need to stay home longer to avoid infecting others. You can also use a face mask and careful hand washing to prevent spread of the virus.  Increase the usage of your inhaler if you have asthma.   Do not use any tobacco products, including cigarettes, chewing tobacco, or electronic cigarettes. If you need help quitting, ask your health care provider. PREVENTION  The best way to protect yourself from getting a cold is to practice good hygiene.   Avoid oral or hand contact with people with cold  symptoms.   Wash your hands often if contact occurs.  There is no clear evidence that vitamin C, vitamin E, echinacea, or exercise reduces the chance of developing a cold. However, it is always recommended to get plenty of rest, exercise, and practice good nutrition.  SEEK MEDICAL CARE IF:   You are getting worse rather than better.   Your symptoms are not controlled by medicine.   You have chills.  You have worsening shortness of breath.  You have brown or red mucus.  You have yellow or brown nasal  discharge.  You have pain in your face, especially when you bend forward.  You have a fever.  You have swollen neck glands.  You have pain while swallowing.  You have white areas in the back of your throat. SEEK IMMEDIATE MEDICAL CARE IF:   You have severe or persistent:  Headache.  Ear pain.  Sinus pain.  Chest pain.  You have chronic lung disease and any of the following:  Wheezing.  Prolonged cough.  Coughing up blood.  A change in your usual mucus.  You have a stiff neck.  You have changes in your:  Vision.  Hearing.  Thinking.  Mood. MAKE SURE YOU:   Understand these instructions.  Will watch your condition.  Will get help right away if you are not doing well or get worse.   This information is not intended to replace advice given to you by your health care provider. Make sure you discuss any questions you have with your health care provider.   Document Released: 10/19/2000 Document Revised: 09/09/2014 Document Reviewed: 07/31/2013 Elsevier Interactive Patient Education 2016 [employer].     IF you received an x-ray today, you will receive an invoice from [facility]. Please contact [facility] at [phone] with questions or concerns regarding your invoice.   IF you received labwork today, you will receive an invoice from [employer]. Please contact [facility] at [phone] with questions or concerns regarding your invoice.   Our billing staff will not be able to assist you with questions regarding bills from these companies.  You will be contacted with the lab results as soon as they are available. The fastest way to get your results is to activate your My Chart account. Instructions are located on the last page of this paperwork. If you have not heard from [place] regarding the results in 2 weeks, please contact this office.

## 2016-02-24 ENCOUNTER — Ambulatory Visit: Payer: Medicare Other | Admitting: Neurology

## 2016-02-24 DIAGNOSIS — Z029 Encounter for administrative examinations, unspecified: Secondary | ICD-10-CM

## 2016-02-29 ENCOUNTER — Ambulatory Visit (INDEPENDENT_AMBULATORY_CARE_PROVIDER_SITE_OTHER): Payer: Medicare Other | Admitting: Gynecology

## 2016-02-29 ENCOUNTER — Encounter: Payer: Self-pay | Admitting: Gynecology

## 2016-02-29 VITALS — BP 118/76 | Ht 65.0 in | Wt 161.0 lb

## 2016-02-29 DIAGNOSIS — N952 Postmenopausal atrophic vaginitis: Secondary | ICD-10-CM

## 2016-02-29 DIAGNOSIS — Z01419 Encounter for gynecological examination (general) (routine) without abnormal findings: Secondary | ICD-10-CM

## 2016-02-29 DIAGNOSIS — M8580 Other specified disorders of bone density and structure, unspecified site: Secondary | ICD-10-CM

## 2016-02-29 NOTE — Progress Notes (Signed)
    Amanda [name] [birth year] [id number]        65 y.o.  [id number]  for breast and pelvic exam  Past medical history,surgical history, problem list, medications, allergies, family history and social history were all reviewed and documented as reviewed in the EPIC chart.  ROS:  Performed with pertinent positives and negatives included in the history, assessment and plan.   Additional significant findings :  None   Exam: [name] assistant Vitals:   02/29/16 1441  BP: 118/76  Weight: 161 lb (73 kg)  Height: 5' 5"  (1.651 m)   Body mass index is 26.79 kg/m.  General appearance:  Normal affect, orientation and appearance. Skin: Grossly normal HEENT: Without gross lesions.  No cervical or supraclavicular adenopathy. Thyroid normal.  Lungs:  Clear without wheezing, rales or rhonchi Cardiac: RR, without RMG Abdominal:  Soft, nontender, without masses, guarding, rebound, organomegaly or hernia Breasts:  Examined lying and sitting without masses, retractions, discharge or axillary adenopathy.Bilateral reduction scars noted Pelvic:  Ext, BUS, Vagina with atrophic changes  Adnexa without masses or tenderness    Anus and perineum normal   Rectovaginal normal sphincter tone without palpated masses or tenderness.    Assessment/Plan:  65 y.o. [id number] female for breast and pelvic exam.   1. Postmenopausal/atrophic genital changes. 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 Doing well without significant hot flushes, night sweats, vaginal dryness. 2. Osteopenia. DEXA 06/2015 T score -2.1 FRAX 9%/1% stable from prior DEXA. Increased calcium vitamin D. Repeat DEXA 2 year interval. 3. Mammography coming due in November patient will schedule. SBE monthly reviewed. 4. Colonoscopy 2017. Repeat at their recommended interval. 5. Pap smear 2015. No Pap smear done today. No history of abnormal Pap smears. Options to stop screening per current screening guidelines based on age and hysterectomy  history reviewed. Will readdress on annual basis. 6. History of herpes labialis. Uses Valtrex intermittently. Will call if needs more. 7. Health maintenance. No routine blood work done as this is done elsewhere. Follow up 1 year, sooner as needed.   [name], 3:05 PM 02/29/2016

## 2016-02-29 NOTE — Patient Instructions (Signed)

## 2016-03-01 LAB — URINALYSIS W MICROSCOPIC + REFLEX CULTURE
Bacteria, UA: NONE SEEN [HPF]
Bilirubin Urine: NEGATIVE
CASTS: NONE SEEN [LPF]
CRYSTALS: NONE SEEN [HPF]
Glucose, UA: NEGATIVE
HGB URINE DIPSTICK: NEGATIVE
KETONES UR: NEGATIVE
Leukocytes, UA: NEGATIVE
Nitrite: NEGATIVE
Protein, ur: NEGATIVE
RBC / HPF: NONE SEEN RBC/HPF (ref <=2)
SQUAMOUS EPITHELIAL / LPF: NONE SEEN [HPF] (ref <=5)
Specific Gravity, Urine: 1.014 (ref 1.001–1.035)
WBC, UA: NONE SEEN WBC/HPF (ref <=5)
Yeast: NONE SEEN [HPF]
pH: 6 (ref 5.0–8.0)

## 2016-03-04 DIAGNOSIS — E7800 Pure hypercholesterolemia, unspecified: Secondary | ICD-10-CM | POA: Diagnosis not present

## 2016-03-04 LAB — LIPID PANEL
CHOLESTEROL: 192 mg/dL (ref 0–200)
HDL: 81 mg/dL — AB (ref 35–70)
LDL CALC: 102 mg/dL
LDl/HDL Ratio: 1.3
Triglycerides: 43 mg/dL (ref 40–160)

## 2016-03-04 LAB — BASIC METABOLIC PANEL
BUN: 18 mg/dL (ref 4–21)
Glucose: 105 mg/dL
Sodium: 143 mmol/L (ref 137–147)

## 2016-03-04 LAB — HEPATIC FUNCTION PANEL
ALK PHOS: 89 U/L (ref 25–125)
ALT: 22 U/L (ref 7–35)
AST: 23 U/L (ref 13–35)

## 2016-03-11 DIAGNOSIS — F419 Anxiety disorder, unspecified: Secondary | ICD-10-CM | POA: Diagnosis not present

## 2016-03-11 DIAGNOSIS — E7800 Pure hypercholesterolemia, unspecified: Secondary | ICD-10-CM | POA: Diagnosis not present

## 2016-03-11 DIAGNOSIS — G4700 Insomnia, unspecified: Secondary | ICD-10-CM | POA: Diagnosis not present

## 2016-03-11 DIAGNOSIS — R1310 Dysphagia, unspecified: Secondary | ICD-10-CM | POA: Diagnosis not present

## 2016-03-16 ENCOUNTER — Other Ambulatory Visit (INDEPENDENT_AMBULATORY_CARE_PROVIDER_SITE_OTHER): Payer: Medicare Other

## 2016-03-16 ENCOUNTER — Encounter: Payer: Self-pay | Admitting: Gastroenterology

## 2016-03-16 ENCOUNTER — Ambulatory Visit (INDEPENDENT_AMBULATORY_CARE_PROVIDER_SITE_OTHER): Payer: Medicare Other | Admitting: Gastroenterology

## 2016-03-16 VITALS — BP 120/80 | HR 72 | Ht 65.0 in | Wt 164.6 lb

## 2016-03-16 DIAGNOSIS — R1310 Dysphagia, unspecified: Secondary | ICD-10-CM

## 2016-03-16 DIAGNOSIS — K900 Celiac disease: Secondary | ICD-10-CM

## 2016-03-16 DIAGNOSIS — K219 Gastro-esophageal reflux disease without esophagitis: Secondary | ICD-10-CM | POA: Diagnosis not present

## 2016-03-16 LAB — VITAMIN D 25 HYDROXY (VIT D DEFICIENCY, FRACTURES): VITD: 29.67 ng/mL — AB (ref 30.00–100.00)

## 2016-03-16 MED ORDER — RANITIDINE HCL 150 MG PO TABS: 150.0000 mg | ORAL_TABLET | Freq: Two times a day (BID) | ORAL | 3 refills | Status: DC

## 2016-03-16 NOTE — Progress Notes (Signed)
HPI :  65 year old female previously known to Dr. Sharlett Iles with reported diagnosis of celiac disease, reflux, and long-standing dysphagia here for follow-up.  She reports having dry /scratchy throat and having things get "caught" in it with swallows She denies sense of globus. She has dysphagia to solids and liquids both, more so actually to liquids. She tries to lift her head up and swallow to help push things down. She reports it is intermittent, but occurs frequently. She does not think it was bothering her in January when she had her last endoscopy, more so in recent months. She endorses dysphagia to liquids for a long time. She has symptoms in the sternal notch. She denies any nasal regurgitaiton, but can have some coughing with this dysphagia when it occurs most of the time. She does drink water to help push things down at times. She has had an extensive evaluation for dysphagia over years looking in her chart, she has had 4 EGDs without any clear pathology, biopsies have ruled out EoE, and she has had esophageal manometry which has been normal.   She had been using apple cider vinegar for reflux symptoms, no antacids. This has worked for her in the past but reports reflux is bothering her more often recently. She reports she sees a masseuse who "pushes on her hiatal hernia to reduce it". Her last EGD did not show a hiatal hernia. She denies using any PPIs or other antacids at present time.   She is on strict gluten free diet. Her EGD showed celiac in remission and celiac ABs normal in January. She had a DEXA in February 2017 - showed osteopenia. She otherwise takes "essential oils" for baseline IBS and helps her symptoms, this also contains peppermint.   Procedural history: EGD 06/09/15 - normal esophagus, no hiatal hernia, showed mild duodenal bulb erythema but biopsies of the 2nd portion of the duodenum and bulb showed no evidence of active celiac.  Colonoscopy 06/09/15 - 2 polyps removed on  colonoscopy, the largest a 1cm sessile serrated polyp in the right colon, and another smaller adenoma. biopsies of the colon were taken and no evidence for microscopic colitis. EGD - 07/25/2011 - normal , bx negative for EoE Esophageal manometry 08/04/2010 - reportedly normal per note of Dr Sharlett Iles EGD 11/30/2009 - normal esophagus, biopsies taken to rule out EoE - path negative for reflux Colonoscopy 08/2007 - normal EGD 08/2007 - biopsies taken to rule out EoE, hiatal hernia - path negative for EoE  Barium swallow 2002 - prominent cricopharyngeal muscle, otherwise normal Past Medical History:  Diagnosis Date  . Bruises easily   . Celiac disease    Gluten free diet  . Change in voice   . Elevated cholesterol   . GERD (gastroesophageal reflux disease)   . Hiatal hernia   . Insomnia   . Myalgia and myositis, unspecified   . Osteoarthrosis, unspecified whether generalized or localized, unspecified site   . Osteopenia 06/2015   T score -2.1 FRAX 9.4%/1.3% stable from prior DEXA  . Other abnormal blood chemistry   . Other symptoms involving cardiovascular system   . Reflux   . Restless leg syndrome   . STD (sexually transmitted disease)    HSV  . Thyroid cancer ([place])   . Vertigo      Past Surgical History:  Procedure Laterality Date  . ABDOMINAL HYSTERECTOMY  1986   TAH  . BRAIN SURGERY  2010   hemorrhage  . [place]  Reduction  . COLONOSCOPY    . ESOPHAGOGASTRODUODENOSCOPY    . KNEE SURGERY  2002   both knees  . NM MYOVIEW LTD  November 2012   No ischemia or infarction  . THYROID SURGERY     Partial thyroidectomy  . TRANSTHORACIC ECHOCARDIOGRAM   April 2013; 01/18/2013   Grade 1 diastolic dysfunction; normal EF 60-65%. No significant valve disease   Family History  Problem Relation Age of Onset  . Diabetes Maternal Aunt   . Breast cancer Maternal Aunt     Maternal Great aunt-Age 2's  . Liver cancer Father   . Heart disease Maternal Grandfather   .  Hyperlipidemia Maternal Grandfather   . Hypertension Paternal Grandfather   . Colon cancer Neg Hx    Social History  Substance Use Topics  . Smoking status: Never Smoker  . Smokeless tobacco: Never Used  . Alcohol use 0.6 oz/week    1 Standard drinks or equivalent per week     Comment: very little   Current Outpatient Prescriptions  Medication Sig Dispense Refill  . APPLE CIDER VINEGAR PO Take 1 capsule by mouth 2 (two) times daily. Reported on 06/09/2015    . Atorvastatin Calcium (LIPITOR PO) Take 20 mg by mouth every other day.     . benzonatate (TESSALON) 100 MG capsule Take 1-2 capsules (100-200 mg total) by mouth 3 (three) times daily as needed for cough. 40 capsule 0  . CALCIUM-VITAMIN D PO Take 2 tablets by mouth daily.    . clonazePAM (KLONOPIN) 1 MG tablet Take 0.5 mg by mouth at bedtime.    . CO ENZYME Q-10 PO Take by mouth.    . cycloSPORINE (RESTASIS) 0.05 % ophthalmic emulsion Place 1 drop into both eyes 2 (two) times daily.    [name] escitalopram (LEXAPRO) 20 MG tablet Take 1 tablet by mouth daily.    . magnesium citrate SOLN Take 1 Bottle by mouth once.    . metoprolol succinate (TOPROL-XL) 25 MG 24 hr tablet TAKE 1 TABLET BY MOUTH IN THE EVENING MAY TAKE AN ADDITIONAL DOSE WITH ONSET OF PALPITATIONS 30 tablet 8  . Multiple Vitamin (MULTIVITAMIN) tablet Take 1 tablet by mouth daily.      [name] Propylene Glycol (SYSTANE BALANCE) 0.6 % SOLN Apply 1 drop to eye 2 (two) times daily. For dry eyes, takes along with restasis    . rOPINIRole (REQUIP) 0.25 MG tablet Take 0.25 mg by mouth at bedtime.    . valACYclovir (VALTREX) 500 MG tablet TAKE 1 TABLET BY MOUTH EVERY DAY 90 tablet 0   No current facility-administered medications for this visit.    Allergies  Allergen Reactions  . Ambien [Zolpidem Tartrate] Other (See Comments)    Causes Neurological problems with very bad dizziness, pains, disorientation  . Cortisone Other (See Comments)    REACTION: "mania" post ESI  . Eszopiclone  And Related Other (See Comments)    Causes Neurological problems with very bad dizziness, pains, disorientation  . Levofloxacin Other (See Comments)    Muscle spasms and jerking movements  . Dicloxacillin Other (See Comments)    Stomach problems, swelling of tongue  . Dilantin [Phenytoin Sodium Extended] Hives and Itching  . Erythromycin Other (See Comments)    Tears stomach up  . Avelox [Moxifloxacin Hcl In Nacl]   . Other     Celiac disease  . Nitrofuran Derivatives Other (See Comments)    unknown  . Phenytoin Sodium Extended Rash  . Trazodone And Nefazodone Other (See  Comments)    unknown     Review of Systems: All systems reviewed and negative except where noted in HPI.    Dg Chest 2 View  Result Date: 02/19/2016 CLINICAL DATA:  Shortness of breath with cough EXAM: CHEST  2 VIEW COMPARISON:  June 24, 2015 FINDINGS: There is no appreciable edema or consolidation. The heart size and pulmonary vascularity are normal. No adenopathy. There is atherosclerotic calcification in the aorta. There is mild mid thoracic dextroscoliosis. IMPRESSION: No edema or consolidation.  There is aortic atherosclerosis. Electronically Signed   By: Lowella Grip III M.D.   On: 02/19/2016 16:38   Lab Results  Component Value Date   WBC 7.3 01/21/2015   HGB 13.7 01/21/2015   HCT 41.4 01/21/2015   MCV 91.4 01/21/2015   PLT 227 01/21/2015    Lab Results  Component Value Date   CREATININE 0.86 05/15/2015   BUN 17 05/15/2015   NA 143 05/15/2015   K 5.1 05/15/2015   CL 105 05/15/2015   CO2 26 05/15/2015    Labs from primary care reviewed show normal LFTs, CBC, renal function   Physical Exam: BP 120/80   Pulse 72   Ht 5' 5"  (1.651 m)   Wt 164 lb 9.6 oz (74.7 kg)   BMI 27.39 kg/m  Constitutional: Pleasant,well-developed, female in no acute distress. HEENT: Normocephalic and atraumatic. Conjunctivae are normal. No scleral icterus. Neck supple.  Cardiovascular: Normal rate,  regular rhythm.  Pulmonary/chest: Effort normal and breath sounds normal. No wheezing, rales or rhonchi. Abdominal: Soft, nondistended, nontender. There are no masses palpable. No hepatomegaly. Extremities: no edema Lymphadenopathy: No cervical adenopathy noted. Neurological: Alert and oriented to person place and time. Skin: Skin is warm and dry. No rashes noted. Psychiatric: Normal mood and affect. Behavior is normal.   ASSESSMENT AND PLAN: 65 year old female here for reassessment of the following issues:  Dysphagia - appears to be long-standing and intermittent, often worse with liquids. 4 EGDs over the years with biopsy showing no evidence of EoE, and no stenosis / stricture. Esophageal manometry previously normal. When asking about her symptoms today, she often has coughing with swallows positions her head differently to minimize the symptoms. It's possible she has oro- pharyngeal dysphagia in this light, and with her coughing will obtain modified barium swallow to ensure no aspiration. If this is negative, it otherwise possible she has a mild stenosis of the UES/cricopharyngeal area, and would offer her an empiric dilation which she has not had the past. She agreed with the plan, I'll notify her of the results of the modified barium swallow.  GERD - She doesn't want any strong antacids at this time however symptoms are bothering her despite using her apple cider vinegar which is her normal regimen. I discussed options with her, she thinks she is responded to Zantac in the past so I offered her this at 150 mg once to twice a day as needed. If symptoms persist would consider PPI but she declined at present time.  Celiac disease - she is very strict with gluten-free diet, her EGD with small bowel biopsies were negative, celiac serologies within the past year negative. Overall her celiac appears well-controlled. TSH normal . She does have osteopenia on DEXA scan, will send vitamin D levels to  ensure no deficiency, and replete as needed. Biopsies of her colon were otherwise normal showing no evidence of microscopic colitis. I suspect she has con commitment IBS, using peppermint supplements as needed which works well for  her. She should follow up yearly for her celiac with [place].   [place] Cellar, MD [facility] Pager [phone]

## 2016-03-16 NOTE — Patient Instructions (Addendum)
If you are age 66 or older, your body mass index should be between 23-30. Your Body mass index is 27.39 kg/m. If this is out of the aforementioned range listed, please consider follow up with your Primary Care Provider.  If you are age 57 or younger, your body mass index should be between 19-25. Your Body mass index is 27.39 kg/m. If this is out of the aformentioned range listed, please consider follow up with your Primary Care Provider.   Your physician has requested that you go to the basement for the following lab work before leaving today: Vitamin D  We have sent the following medications to your pharmacy for you to pick up at your convenience:  Zantac 171m Take 1-2 tablets daily as needed.  You have been scheduled for a modified barium swallow on Friday November 17th at 1:00pm. Please arrive 15 minutes prior to your test for registration. [facility]. There are no restrictions prior to test. Should you need to cancel or reschedule your appointment, please contact [phone]([facility] or [phone]([place]). _____________________________________________________________________ A Modified Barium Swallow Study, or MBS, is a special x-ray that is taken to check swallowing skills. It is carried out by a RStage managerand a SPsychologist, clinical(SLP). During this test, yourmouth, throat, and esophagus, a muscular tube which connects your mouth to your stomach, is checked. The test will help you, your doctor, and the SLP plan what types of foods and liquids are easier for you to swallow. The SLP will also identify positions and ways to help you swallow more easily and safely. What will happen during an MBS? You will be taken to an x-ray room and seated comfortably. You will be asked to swallow small amounts of food and liquid mixed with barium. Barium is a liquid or paste that allows images of your mouth, throat and  esophagus to be seen on x-ray. The x-ray captures moving images of the food you are swallowing as it travels from your mouth through your throat and into your esophagus. This test helps identify whether food or liquid is entering your lungs (aspiration). The test also shows which part of your mouth or throat lacks strength or coordination to move the food or liquid in the right direction. This test typically takes 30 minutes to 1 hour to complete. _______________________________________________________________________

## 2016-03-17 ENCOUNTER — Telehealth: Payer: Self-pay

## 2016-03-17 ENCOUNTER — Other Ambulatory Visit (HOSPITAL_COMMUNITY): Payer: Self-pay | Admitting: Gastroenterology

## 2016-03-17 DIAGNOSIS — R1319 Other dysphagia: Secondary | ICD-10-CM

## 2016-03-17 NOTE — Telephone Encounter (Signed)
-----   Message from Manus Gunning, MD sent at 03/16/2016  4:46 PM EST ----- Caryl Pina can you please let Mrs. Aquilar her vitamin D levels are just barely below the normal range (normal is > 30, she is 29.67). Can you ensure she is taking at least 1000 to 2000 IU of vitamin D per day, it is not clear to me her dose she is taking at home but think it is likely less. If she is already taking 1000 IU per day, she should increase to 2000 IU per day. We can recheck levels in 3-4 months. Thanks

## 2016-03-17 NOTE — Telephone Encounter (Signed)
Pt informed of results. She is currently taking 800IU per day. She will purchase Vit D and take 2ooo IU per day.  Future orders placed for Vit D levels.

## 2016-03-18 ENCOUNTER — Ambulatory Visit
Admission: RE | Admit: 2016-03-18 | Discharge: 2016-03-18 | Disposition: A | Discharge status: Home / Self Care | Payer: Medicare Other | Source: Ambulatory Visit | Attending: Gynecology | Admitting: Gynecology

## 2016-03-18 DIAGNOSIS — Z1231 Encounter for screening mammogram for malignant neoplasm of breast: Secondary | ICD-10-CM

## 2016-03-25 ENCOUNTER — Ambulatory Visit (HOSPITAL_COMMUNITY)
Admission: RE | Admit: 2016-03-25 | Discharge: 2016-03-25 | Disposition: A | Discharge status: Home / Self Care | Payer: Medicare Other | Source: Ambulatory Visit | Attending: Gastroenterology | Admitting: Gastroenterology

## 2016-03-25 DIAGNOSIS — R1319 Other dysphagia: Secondary | ICD-10-CM

## 2016-03-25 DIAGNOSIS — R05 Cough: Secondary | ICD-10-CM | POA: Diagnosis not present

## 2016-03-25 DIAGNOSIS — K900 Celiac disease: Secondary | ICD-10-CM

## 2016-03-25 DIAGNOSIS — K219 Gastro-esophageal reflux disease without esophagitis: Secondary | ICD-10-CM | POA: Diagnosis not present

## 2016-03-25 DIAGNOSIS — R1310 Dysphagia, unspecified: Secondary | ICD-10-CM

## 2016-03-25 NOTE — Progress Notes (Signed)
Modified Barium Swallow Progress Note  Patient Details  Name: Amanda [name] MRN: [id number] Date of Birth: April 29, 1951  Today's Date: 03/25/2016  Modified Barium Swallow completed.  Full report located under Chart Review in the Imaging Section.  Brief recommendations include the following:  Clinical Impression  Oral mastication, manipulation and transit within functional limits. Pharyngeal phase marked by timely swallowing initiation, adequate pharyngeal contraction, hyolaryngeal elevation and epiglottic inversion. No laryngeal penetration or aspiration present. 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 Recommend continue regular textures and thin liquids. Discussed esophageal precautions which pt is conscientious of and states she adheres to.    Swallow Evaluation Recommendations       SLP Diet Recommendations: Regular solids;Thin liquid   Liquid Administration via: Cup;Straw   Medication Administration: Whole meds with liquid   Supervision: Patient able to self feed   Compensations: Follow solids with liquid   Postural Changes: Seated upright at 90 degrees;Remain semi-upright after after feeds/meals (Comment)   Oral Care Recommendations: Oral care BID        [place] Siren 03/25/2016,5:02 PM   Orbie Pyo Colvin Caroli.Ed [employer] [phone]

## 2016-04-10 ENCOUNTER — Other Ambulatory Visit: Payer: Self-pay | Admitting: Cardiology

## 2016-05-31 DIAGNOSIS — N390 Urinary tract infection, site not specified: Secondary | ICD-10-CM | POA: Diagnosis not present

## 2016-05-31 DIAGNOSIS — R5383 Other fatigue: Secondary | ICD-10-CM | POA: Diagnosis not present

## 2016-06-02 DIAGNOSIS — L821 Other seborrheic keratosis: Secondary | ICD-10-CM | POA: Diagnosis not present

## 2016-06-02 DIAGNOSIS — L820 Inflamed seborrheic keratosis: Secondary | ICD-10-CM | POA: Diagnosis not present

## 2016-06-02 DIAGNOSIS — Z85828 Personal history of other malignant neoplasm of skin: Secondary | ICD-10-CM | POA: Diagnosis not present

## 2016-06-28 DIAGNOSIS — J069 Acute upper respiratory infection, unspecified: Secondary | ICD-10-CM | POA: Diagnosis not present

## 2016-07-04 ENCOUNTER — Ambulatory Visit (INDEPENDENT_AMBULATORY_CARE_PROVIDER_SITE_OTHER): Payer: Medicare Other | Admitting: Family Medicine

## 2016-07-04 ENCOUNTER — Encounter: Payer: Self-pay | Admitting: Family Medicine

## 2016-07-04 VITALS — BP 114/76 | HR 72 | Temp 98.3°F | Ht 65.0 in | Wt 159.0 lb

## 2016-07-04 DIAGNOSIS — H6981 Other specified disorders of Eustachian tube, right ear: Secondary | ICD-10-CM

## 2016-07-04 DIAGNOSIS — H6991 Unspecified Eustachian tube disorder, right ear: Secondary | ICD-10-CM

## 2016-07-04 NOTE — Progress Notes (Signed)
Amanda [name] is a 66 y.o. female is here to [facility].   History of Present Illness:    1. Right ear congestion, x 1-2 weeks, after travel. Has tried using Pseudoephedrine, Claritin, and essential oils without relief. No fever, cough, CP, SOB, wheeze.    Health Maintenance Due  Topic Date Due  . Hepatitis C Screening  [birth year]  . PAP SMEAR  02/25/2015  . PNA vac Low Risk Adult (1 of 2 - PCV13) 04/16/2015    PMHx, SurgHx, SocialHx, Medications, and Allergies were reviewed in the Visit Navigator and updated as appropriate.    Past Medical History:  Diagnosis Date  . Bruises easily   . Celiac disease    Gluten free diet  . Change in voice   . Elevated cholesterol   . GERD (gastroesophageal reflux disease)   . Hiatal hernia   . Insomnia   . Myalgia and myositis, unspecified   . Osteoarthrosis, unspecified whether generalized or localized, unspecified site   . Osteopenia 06/2015   T score -2.1 FRAX 9.4%/1.3% stable from prior DEXA  . Other abnormal blood chemistry   . Other symptoms involving cardiovascular system   . Reflux   . Restless leg syndrome   . STD (sexually transmitted disease)    HSV  . Thyroid cancer ([place])   . Vertigo     Past Surgical History:  Procedure Laterality Date  . ABDOMINAL HYSTERECTOMY  1986   TAH  . BRAIN SURGERY  2010   hemorrhage  . BREAST SURGERY  1999   Reduction  . COLONOSCOPY    . ESOPHAGOGASTRODUODENOSCOPY    . KNEE SURGERY  2002   both knees  . NM MYOVIEW LTD  November 2012   No ischemia or infarction  . THYROID SURGERY     Partial thyroidectomy  . TRANSTHORACIC ECHOCARDIOGRAM   April 2013; 01/18/2013   Grade 1 diastolic dysfunction; normal EF 60-65%. No significant valve disease    Family History  Problem Relation Age of Onset  . Diabetes Maternal Aunt   . Breast cancer Maternal Aunt     Maternal Great aunt-Age 22's  . Liver cancer Father   . Heart disease Maternal Grandfather   . Hyperlipidemia Maternal  Grandfather   . Hypertension Paternal Grandfather   . Colon cancer Neg Hx     Social History  Substance Use Topics  . Smoking status: Never Smoker  . Smokeless tobacco: Never Used  . Alcohol use 0.6 oz/week    1 Standard drinks or equivalent per week     Comment: very little     Current Medications and Allergies:    Current Outpatient Prescriptions:  .  APPLE CIDER VINEGAR PO, Take 1 capsule by mouth 2 (two) times daily. Reported on 06/09/2015, Disp: , Rfl:  .  Atorvastatin Calcium (LIPITOR PO), Take 20 mg by mouth every other day. , Disp: , Rfl:  .  CALCIUM-VITAMIN D PO, Take 2 tablets by mouth daily., Disp: , Rfl:  .  clonazePAM (KLONOPIN) 1 MG tablet, Take 0.5 mg by mouth at bedtime., Disp: , Rfl:  .  CO ENZYME Q-10 PO, Take by mouth., Disp: , Rfl:  .  cycloSPORINE (RESTASIS) 0.05 % ophthalmic emulsion, Place 1 drop into both eyes 2 (two) times daily., Disp: , Rfl:  .  escitalopram (LEXAPRO) 20 MG tablet, Take 1 tablet by mouth daily., Disp: , Rfl:  .  magnesium citrate SOLN, Take 1 Bottle by mouth once., Disp: , Rfl:  .  metoprolol succinate (TOPROL-XL) 25 MG 24 hr tablet, TAKE 1 TABLET BY MOUTH IN THE EVENING MAY TAKE AN ADDITIONAL DOSE WITH ONSET OF PALPITATIONS, Disp: 30 tablet, Rfl: 6 .  Multiple Vitamin (MULTIVITAMIN) tablet, Take 1 tablet by mouth daily.  , Disp: , Rfl:  .  Propylene Glycol (SYSTANE BALANCE) 0.6 % SOLN, Apply 1 drop to eye 2 (two) times daily. For dry eyes, takes along with restasis, Disp: , Rfl:  .  rOPINIRole (REQUIP) 0.25 MG tablet, Take 0.25 mg by mouth at bedtime., Disp: , Rfl:  .  valACYclovir (VALTREX) 500 MG tablet, TAKE 1 TABLET BY MOUTH EVERY DAY, Disp: 90 tablet, Rfl: 0   Allergies  Allergen Reactions  . Ambien [Zolpidem Tartrate] Other (See Comments)    Causes Neurological problems with very bad dizziness, pains, disorientation  . Cortisone Other (See Comments)    REACTION: "mania" post ESI  . Eszopiclone And Related Other (See Comments)      Causes Neurological problems with very bad dizziness, pains, disorientation  . Levofloxacin Other (See Comments)    Muscle spasms and jerking movements  . Dicloxacillin Other (See Comments)    Stomach problems, swelling of tongue  . Dilantin [Phenytoin Sodium Extended] Hives and Itching  . Erythromycin Other (See Comments)    Tears stomach up  . Avelox [Moxifloxacin Hcl In Nacl]   . Other     Celiac disease  . Nitrofuran Derivatives Other (See Comments)    unknown  . Phenytoin Sodium Extended Rash  . Trazodone And Nefazodone Other (See Comments)    unknown      Patient Information Form: Screening and ROS    Review of Systems  General:  Negative for unexplained weight loss, fever Skin: Negative for new or changing mole, sore that won't heal HEENT: Negative for trouble hearing, trouble seeing, ringing in ears, mouth sores, hoarseness, change in voice, dysphagia CV:  Negative for chest pain, dyspnea, edema, palpitations Resp: Negative for cough, dyspnea, hemoptysis GI: Negative for nausea, vomiting, diarrhea, constipation, abdominal pain, melena, hematochezia GU: Negative for dysuria, incontinence, urinary hesitance, hematuria, vaginal or penile discharge, polyuria, sexual difficulty, lumps in testicle or breasts MSK: Negative for muscle cramps or aches, joint pain or swelling Neuro: Negative for headaches, weakness, numbness, dizziness, passing out/fainting Psych: Negative for depression, anxiety, memory problems   Vitals:   Vitals:   07/04/16 1356  BP: 114/76  Pulse: 72  Temp: 98.3 F (36.8 C)  TempSrc: Oral  SpO2: 96%  Weight: 159 lb (72.1 kg)  Height: 5' 5"  (1.651 m)     Body mass index is 26.46 kg/m.   Physical Exam:   General: Alert, cooperative, appears stated age and no distress.  HEENT:  Normocephalic, without obvious abnormality, atraumatic. Conjunctivae/corneas clear. PERRL, EOM's intact. Normal TM's and external ear canals both ears. Nares normal.  Septum midline. Mucosa normal. No drainage or sinus tenderness. Lips, mucosa, and tongue normal; teeth and gums normal.  Lungs: Clear to auscultation bilaterally.  Heart:: Regular rate and rhythm, S1, S2 normal, no murmur, click, rub or gallop.  Abdomen: Soft, non-tender; bowel sounds normal; no masses,  no organomegaly.  Extremities: Extremities normal, atraumatic, no cyanosis or edema.  Pulses: 2+ and symmetric.  Skin: Skin color, texture, turgor normal. No rashes or lesions.  Neurologic: Alert and oriented X 3, normal strength and tone. Normal symmetric. reflexes. Normal coordination and gait.  Psych: Alert,oriented, in NAD with a full range of affect, normal behavior and no psychotic features  Assessment and Plan:    Hortencia was seen today for establish care.  Diagnoses and all orders for this visit:  Dysfunction of right eustachian tube   . Reviewed expectations re: course of current medical issues. . Discussed self-management of symptoms. . Outlined signs and symptoms indicating need for more acute intervention. . Patient verbalized understanding and all questions were answered. . See orders for this visit as documented in the electronic medical record. . Patient received an After Visit Summary.   Briscoe Deutscher, [place], [place] 07/04/2016   Follow-up: No Follow-up on file.  No orders of the defined types were placed in this encounter.  Medications Discontinued During This Encounter  Medication Reason  . benzonatate (TESSALON) 100 MG capsule Error  . ranitidine (ZANTAC) 150 MG tablet Error   No orders of the defined types were placed in this encounter.

## 2016-07-04 NOTE — Progress Notes (Signed)
Pre visit review using our clinic review tool, if applicable. No additional management support is needed unless otherwise documented below in the visit note. 

## 2016-07-05 DIAGNOSIS — I788 Other diseases of capillaries: Secondary | ICD-10-CM | POA: Diagnosis not present

## 2016-07-05 DIAGNOSIS — Z85828 Personal history of other malignant neoplasm of skin: Secondary | ICD-10-CM | POA: Diagnosis not present

## 2016-07-05 DIAGNOSIS — L720 Epidermal cyst: Secondary | ICD-10-CM | POA: Diagnosis not present

## 2016-07-26 DIAGNOSIS — H9201 Otalgia, right ear: Secondary | ICD-10-CM | POA: Insufficient documentation

## 2016-07-26 DIAGNOSIS — M542 Cervicalgia: Secondary | ICD-10-CM | POA: Diagnosis not present

## 2016-07-26 DIAGNOSIS — K219 Gastro-esophageal reflux disease without esophagitis: Secondary | ICD-10-CM | POA: Insufficient documentation

## 2016-08-03 ENCOUNTER — Encounter: Payer: Self-pay | Admitting: Family Medicine

## 2016-08-03 DIAGNOSIS — F419 Anxiety disorder, unspecified: Secondary | ICD-10-CM | POA: Insufficient documentation

## 2016-08-15 ENCOUNTER — Telehealth: Payer: Self-pay | Admitting: Family Medicine

## 2016-08-15 NOTE — Telephone Encounter (Signed)
error 

## 2016-08-23 ENCOUNTER — Telehealth: Payer: Self-pay | Admitting: Family Medicine

## 2016-08-23 NOTE — Telephone Encounter (Signed)
ROI faxed 07/08/2016 refaxed 08/23/2016. [facility]

## 2016-08-25 ENCOUNTER — Ambulatory Visit (INDEPENDENT_AMBULATORY_CARE_PROVIDER_SITE_OTHER): Payer: Medicare Other | Admitting: Cardiology

## 2016-08-25 ENCOUNTER — Encounter: Payer: Self-pay | Admitting: Cardiology

## 2016-08-25 VITALS — BP 114/62 | HR 69 | Ht 65.0 in | Wt 160.2 lb

## 2016-08-25 DIAGNOSIS — E785 Hyperlipidemia, unspecified: Secondary | ICD-10-CM | POA: Diagnosis not present

## 2016-08-25 DIAGNOSIS — R002 Palpitations: Secondary | ICD-10-CM

## 2016-08-25 DIAGNOSIS — I493 Ventricular premature depolarization: Secondary | ICD-10-CM

## 2016-08-25 NOTE — Patient Instructions (Signed)
NO CHANGE WITH CURRENT MEDICATIONS OR TREATMENT    Your physician wants you to follow-up in [place]. You will receive a reminder letter in the mail two months in advance. If you don't receive a letter, please call our office to schedule the follow-up appointment.   If you need a refill on your cardiac medications before your next appointment, please call your pharmacy.

## 2016-08-25 NOTE — Progress Notes (Signed)
PCP: Briscoe Deutscher, DO  Clinic Note: Chief Complaint  Patient presents with  . Follow-up    12 months; Pt states no Sx.     HPI: Amanda [name] is a 66 y.o. female with a PMH below who presents today for 2 year follow-up. She is a former patient of Dr. Rollene Fare who I last saw back in April 2016. Negative cardiac evaluation with negative echo and stress test in the past. She did not tolerate statins because of memory issues since she was changed to Zetia.   Amanda [name] was last seen on 08/25/2014 she was doing some palpitations that time.    Recent Hospitalizations: None  Studies Reviewed: None  Interval History: Amanda [name] presents today doing quite well. She has no major complaints. She has been continuing to go to the gym to work out 4-5 days a week. She affect is going there today after this visit. She does not note any further palpitations. She denies any chest tightness,pressure or dyspnea with rest or exertion.  No PND, orthopnea or edema. No recurrent palpitations, lightheadedness, dizziness, weakness or syncope/near syncope. No TIA/amaurosis fugax symptoms. No claudication.  ROS: A comprehensive was performed. Review of Systems  Constitutional: Negative for malaise/fatigue.  Respiratory: Negative for shortness of breath.   Gastrointestinal: Negative for blood in stool and melena.  Genitourinary: Negative for hematuria.  Musculoskeletal: Negative for joint pain.  Psychiatric/Behavioral: Negative.   All other systems reviewed and are negative.   Past Medical History:  Diagnosis Date  . Anxiety   . Bruises easily   . Celiac disease    Gluten free diet  . Change in voice   . Elevated cholesterol   . GERD (gastroesophageal reflux disease)   . Hiatal hernia   . Hyperlipidemia   . Insomnia   . Myalgia and myositis, unspecified   . Osteoarthrosis, unspecified whether generalized or localized, unspecified site   . Osteopenia 06/2015   T score -2.1 FRAX  9.4%/1.3% stable from prior DEXA  . Other abnormal blood chemistry   . Other symptoms involving cardiovascular system   . Reflux   . Restless leg syndrome   . STD (sexually transmitted disease)    HSV  . 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 Thyroid cancer ([place])   . Vertigo     Past Surgical History:  Procedure Laterality Date  . ABDOMINAL HYSTERECTOMY  1986   TAH  . BRAIN SURGERY  2010   hemorrhage  . BREAST SURGERY  1999   Reduction  . COLONOSCOPY    . ESOPHAGOGASTRODUODENOSCOPY    . KNEE SURGERY  2002   both knees  . NM MYOVIEW LTD  November 2012   No ischemia or infarction  . THYROID SURGERY     Partial thyroidectomy  . TRANSTHORACIC ECHOCARDIOGRAM   April 2013; 01/18/2013   Grade 1 diastolic dysfunction; normal EF 60-65%. No significant valve disease    Current Meds  Medication Sig  . APPLE CIDER VINEGAR PO Take 1 capsule by mouth 2 (two) times daily. Reported on 06/09/2015  . Atorvastatin Calcium (LIPITOR PO) Take 20 mg by mouth every other day.   [name] CALCIUM-VITAMIN D PO Take 2 tablets by mouth daily.  . clonazePAM (KLONOPIN) 1 MG tablet Take 0.5 mg by mouth at bedtime.  . CO ENZYME Q-10 PO Take by mouth.  . cycloSPORINE (RESTASIS) 0.05 % ophthalmic emulsion Place 1 drop into both eyes 2 (two) times daily.  [name] escitalopram (  LEXAPRO) 20 MG tablet Take 1 tablet by mouth daily.  . magnesium citrate SOLN Take 1 Bottle by mouth once.  . metoprolol succinate (TOPROL-XL) 25 MG 24 hr tablet TAKE 1 TABLET BY MOUTH IN THE EVENING MAY TAKE AN ADDITIONAL DOSE WITH ONSET OF PALPITATIONS  . Multiple Vitamin (MULTIVITAMIN) tablet Take 1 tablet by mouth daily.    [name] Propylene Glycol (SYSTANE BALANCE) 0.6 % SOLN Apply 1 drop to eye 2 (two) times daily. For dry eyes, takes along with restasis  . rOPINIRole (REQUIP) 0.25 MG tablet Take 0.25 mg by mouth at bedtime.  . valACYclovir (VALTREX) 500 MG tablet TAKE 1 TABLET BY MOUTH EVERY DAY    Allergies    Allergen Reactions  . Ambien [Zolpidem Tartrate] Other (See Comments)    Causes Neurological problems with very bad dizziness, pains, disorientation  . Cortisone Other (See Comments)    REACTION: "mania" post ESI  . Eszopiclone And Related Other (See Comments)    Causes Neurological problems with very bad dizziness, pains, disorientation  . Levofloxacin Other (See Comments)    Muscle spasms and jerking movements  . Dicloxacillin Other (See Comments)    Stomach problems, swelling of tongue  . Dilantin [Phenytoin Sodium Extended] Hives and Itching  . Erythromycin Other (See Comments)    Tears stomach up  . Avelox [Moxifloxacin Hcl In Nacl]   . Other     Celiac disease  . Nitrofuran Derivatives Other (See Comments)    unknown  . Phenytoin Sodium Extended Rash  . Trazodone And Nefazodone Other (See Comments)    unknown    Social History   Social History  . Marital status: Married    Spouse name: N/A  . Number of children: 2  . Years of education: N/A   Social History Main Topics  . Smoking status: Never Smoker  . Smokeless tobacco: Never Used  . Alcohol use 1.2 - 1.8 oz/week    1 Standard drinks or equivalent, 1 - 2 Cans of beer per week  . Drug use: No  . Sexual activity: Yes    Partners: Male    Birth control/ protection: Surgical     Comment: HYST-1st intercourse 66 yo-Fewer than 5 partners   Other Topics Concern  . None   Social History Narrative   Married mother of 2 with no grandchildren.   Never smoked.   Angulatory and active. 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   Pt lives with husband in a three story home, no issue with stairs. Highest level of education is an associates.     family history includes Breast cancer in her maternal aunt; Diabetes in her maternal aunt; Heart disease in her maternal grandfather; Hyperlipidemia in her maternal grandfather; Hypertension in her paternal  grandfather; Liver cancer in her father.  Wt Readings from Last 3 Encounters:  08/25/16 160 lb 3.2 oz (72.7 kg)  07/04/16 159 lb (72.1 kg)  03/16/16 164 lb 9.6 oz (74.7 kg)    PHYSICAL EXAM BP 114/62   Pulse 69   Ht 5' 5"  (1.651 m)   Wt 160 lb 3.2 oz (72.7 kg)   BMI 26.66 kg/m  General appearance: alert, cooperative, appears stated age, no distress and Well-nourished well-groomed. Neck: no adenopathy, no carotid bruit and no JVD Lungs: clear to auscultation bilaterally, normal percussion bilaterally and non-labored Heart: regular rate and rhythm, S1& S2 normal, no murmur, click, rub or gallop; nondisplaced PMI Abdomen: soft,  non-tender; bowel sounds normal; no masses; no HSM/no HJR Extremities: extremities normal, atraumatic, no cyanosis, or edema  Pulses: 2+ and symmetric Skin: mobility and turgor normal, no evidence of bleeding or bruising and no lesions noted  Neurologic: Mental status: Alert, oriented, thought content appropriate; pleasant mood and affect    Adult ECG Report  Rate: 69 ;  Rhythm: normal sinus rhythm, premature atrial contractions (PAC) and Otherwise normal axis, intervals and durations;   Narrative Interpretation: Stable EKG   Other studies Reviewed: Additional studies/ records that were reviewed today include:  Recent Labs:   Lab Results  Component Value Date   CHOL 192 03/04/2016   HDL 81 (A) 03/04/2016   LDLCALC 102 03/04/2016   LDLDIRECT 139.2 03/10/2008   TRIG 43 03/04/2016   CHOLHDL 3.8 05/15/2015  -- Has been taking Lipitor qod.Labs will be rechecked in May.     ASSESSMENT / PLAN: Problem List Items Addressed This Visit    Hyperlipidemia with target LDL less than 130 (Chronic)    He was put back on every other day Lipitor as opposed to Zetia by her PCP. Last lipid check look pretty good from October. Continue to monitor. Doing okay on the every other day statin without too much delayed memory issues or myalgias.      Palpitations -  Primary (Chronic)    Notably improved. Taking very low-dose Toprol and has not had to take any additional doses in the last year. She is staying rehydrated, and thinks a lot of it may be related to anxiety. Nothing to suggest any arrhythmias. No symptoms of syncope or near syncope.      Relevant Orders   EKG 12-Lead (Completed)   PVC (premature ventricular contraction)    PVCs previously noted, currently is her EKG is a PA-C but she did not feel it. No indication to further investigate. She's had a negative cardiac evaluation. Continue Toprol      Relevant Orders   EKG 12-Lead (Completed)      Current medicines are reviewed at length with the patient today. (+/- concerns) None The following changes have been made: None  Patient Instructions  NO CHANGE WITH CURRENT MEDICATIONS OR TREATMENT    Your physician wants you to follow-up in [place]. You will receive a reminder letter in the mail two months in advance. If you don't receive a letter, please call our office to schedule the follow-up appointment.   If you need a refill on your cardiac medications before your next appointment, please call your pharmacy.    Studies Ordered:   Orders Placed This Encounter  Procedures  . EKG 12-Lead      Glenetta Hew, M.D., M.S. Interventional Cardiologist   Pager # [phone] Phone # [phone] [address] [place], [place] [address]

## 2016-08-27 ENCOUNTER — Encounter: Payer: Self-pay | Admitting: Cardiology

## 2016-08-27 DIAGNOSIS — I493 Ventricular premature depolarization: Secondary | ICD-10-CM | POA: Insufficient documentation

## 2016-08-27 NOTE — Assessment & Plan Note (Signed)
Notably improved. Taking very low-dose Toprol and has not had to take any additional doses in the last year. She is staying rehydrated, and thinks a lot of it may be related to anxiety. Nothing to suggest any arrhythmias. No symptoms of syncope or near syncope.

## 2016-08-27 NOTE — Assessment & Plan Note (Addendum)
PVCs previously noted, currently is her EKG is a PA-C but she did not feel it. No indication to further investigate. She's had a negative cardiac evaluation. Continue Toprol

## 2016-08-27 NOTE — Assessment & Plan Note (Signed)
He was put back on every other day Lipitor as opposed to Zetia by her PCP. Last lipid check look pretty good from October. Continue to monitor. Doing okay on the every other day statin without too much delayed memory issues or myalgias.

## 2016-09-06 ENCOUNTER — Encounter: Payer: Self-pay | Admitting: Family Medicine

## 2016-09-14 ENCOUNTER — Ambulatory Visit: Payer: Medicare Other | Admitting: Family Medicine

## 2016-09-20 ENCOUNTER — Ambulatory Visit (INDEPENDENT_AMBULATORY_CARE_PROVIDER_SITE_OTHER): Payer: Medicare Other | Admitting: Sports Medicine

## 2016-09-20 ENCOUNTER — Ambulatory Visit (INDEPENDENT_AMBULATORY_CARE_PROVIDER_SITE_OTHER): Payer: Medicare Other

## 2016-09-20 ENCOUNTER — Encounter: Payer: Self-pay | Admitting: Sports Medicine

## 2016-09-20 VITALS — BP 114/74 | HR 71 | Ht 65.0 in | Wt 159.8 lb

## 2016-09-20 DIAGNOSIS — M545 Low back pain, unspecified: Secondary | ICD-10-CM

## 2016-09-20 DIAGNOSIS — M4807 Spinal stenosis, lumbosacral region: Secondary | ICD-10-CM | POA: Diagnosis not present

## 2016-09-20 DIAGNOSIS — M79671 Pain in right foot: Secondary | ICD-10-CM

## 2016-09-20 MED ORDER — DICLOFENAC SODIUM 75 MG PO TBEC: 75.0000 mg | DELAYED_RELEASE_TABLET | Freq: Two times a day (BID) | ORAL | 0 refills | Status: DC

## 2016-09-20 NOTE — Assessment & Plan Note (Addendum)
Os navicularis appreciated on plain film x-rays.  She may have asymptomatic os versus peroneal tendinitis. Systemic anti-inflammatories Cushioned arch support for exercise shoes recommended Basic ankle rehab exercises reviewed with the patient. The lack of improvement will consider diagnostic ultrasound at follow-up in could consider nitroglycerin protocol if symptoms are consistent findings are consistent with tendinopathy
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
## 2016-09-20 NOTE — Patient Instructions (Signed)
Please go to often running/[employer] for them to evaluate you for new shoes as well as for cushioned insoles.  A product similar to SOLE insoles which are a medium cushioned insole would be great.

## 2016-09-20 NOTE — Assessment & Plan Note (Signed)
Small degree of arthritis on x-rays likely contributing to low back pain.  Discussed the importance of core conditioning as well as avoidance of exacerbating injury while maintaining lifestyle.  Patient would like to defer any type of medication on diclofenac at this time is where we do have the option for using a steroid.  [facility] Spine conditioning program reviewed with patient today.

## 2016-09-21 ENCOUNTER — Telehealth: Payer: Self-pay | Admitting: Sports Medicine

## 2016-09-21 NOTE — Telephone Encounter (Signed)
Patient has concerns over diclofenac (VOLTAREN) 75 MG EC tablet [[id number]] Patient states she is worried she will have a negative reaction to the medication.

## 2016-09-21 NOTE — Telephone Encounter (Signed)
Called patient and advised her on her medication concerns with a negative reaction to "sulfa"...medication does not contain "sulfa" or anything listed in her allergy list in Epic. Patient verbalized her understanding.

## 2016-09-26 ENCOUNTER — Encounter: Payer: Self-pay | Admitting: Family Medicine

## 2016-09-26 ENCOUNTER — Ambulatory Visit (INDEPENDENT_AMBULATORY_CARE_PROVIDER_SITE_OTHER): Payer: Medicare Other | Admitting: Family Medicine

## 2016-09-26 VITALS — BP 134/84 | HR 65 | Temp 98.1°F | Resp 14 | Ht 65.0 in | Wt 160.4 lb

## 2016-09-26 DIAGNOSIS — F341 Dysthymic disorder: Secondary | ICD-10-CM | POA: Insufficient documentation

## 2016-09-26 DIAGNOSIS — Z1159 Encounter for screening for other viral diseases: Secondary | ICD-10-CM

## 2016-09-26 DIAGNOSIS — Z0000 Encounter for general adult medical examination without abnormal findings: Secondary | ICD-10-CM

## 2016-09-26 DIAGNOSIS — E785 Hyperlipidemia, unspecified: Secondary | ICD-10-CM | POA: Diagnosis not present

## 2016-09-26 LAB — LIPID PANEL
Cholesterol: 212 mg/dL — ABNORMAL HIGH (ref 0–200)
HDL: 70 mg/dL (ref >39.00)
LDL Cholesterol: 124 mg/dL — ABNORMAL HIGH (ref 0–99)
NonHDL: 141.76
Total CHOL/HDL Ratio: 3
Triglycerides: 91 mg/dL (ref 0.0–149.0)
VLDL: 18.2 mg/dL (ref 0.0–40.0)

## 2016-09-26 NOTE — Patient Instructions (Signed)
Bring a copy of your advance directives to your next office visit.

## 2016-09-26 NOTE — Progress Notes (Signed)
Amanda [name] is a 66 y.o. female is here for follow up.  History of Present Illness:   Amanda [name] Amanda [name] acting as scribe for Amanda [name].  HPI Patient comes in today for a follow up on medical problems. No concerns today.   Hyperlipidemia  Is the patient taking medications without problems? Not on medication. Trying to exercise on a regular basis? [x]   YES  []   NO Diet Compliance: compliant most of the time. Concerns: None. Cardiovascular ROS: no chest pain or dyspnea on exertion.  Health Maintenance Due  Topic Date Due  . Hepatitis C Screening  06-Jan-1951  . PNA vac Low Risk Adult (1 of 2 - PCV13) 04/16/2015   PMHx, SurgHx, SocialHx, FamHx, Medications, and Allergies were reviewed in the Visit Navigator and updated as appropriate.   Patient Active Problem List   Diagnosis Date Noted  . PVC (premature ventricular contraction) 08/27/2016  . Anxiety 08/03/2016  . Laryngopharyngeal reflux (LPR) 07/26/2016  . Thyroid cancer ([place])   . GERD (gastroesophageal reflux disease)   . Myalgia and myositis, unspecified   . Restless leg syndrome   . Allergic rhinitis 06/25/2009  . Diaphragmatic hernia 02/16/2009  . IBS (irritable bowel syndrome) 02/16/2009  . Hyperlipidemia with target LDL less than 130 09/26/2008  . Celiac disease 11/27/2007  . Osteoarthritis 07/05/2007  . Osteopenia 10/03/2006  . Left carotid bruit 10/03/2006  . History of thyroid cancer 10/03/2006   Social History  Substance Use Topics  . Smoking status: Never Smoker  . Smokeless tobacco: Never Used  . Alcohol use 1.2 - 1.8 oz/week    1 Standard drinks or equivalent, 1 - 2 Cans of beer per week   Current Medications and Allergies:   .  APPLE CIDER VINEGAR PO, Take 1 capsule by mouth 2 (two) times daily. Reported on 06/09/2015, Disp: , Rfl:  .  Atorvastatin Calcium (LIPITOR PO), Take 20 mg by mouth every other day. , Disp: , Rfl:  .  CALCIUM-VITAMIN D PO, Take 2 tablets by mouth daily., Disp: , Rfl:    .  clonazePAM (KLONOPIN) 1 MG tablet, Take 0.5 mg by mouth at bedtime., Disp: , Rfl:  .  CO ENZYME Q-10 PO, Take by mouth daily. , Disp: , Rfl:  .  cycloSPORINE (RESTASIS) 0.05 % ophthalmic emulsion, Place 1 drop into both eyes 2 (two) times daily., Disp: , Rfl:  .  diclofenac (VOLTAREN) 75 MG EC tablet, Take 1 tablet (75 mg total) by mouth 2 (two) times daily. Take 1 tab bid X 10 days then as needed, Disp: 30 tablet, Rfl: 0 .  escitalopram (LEXAPRO) 20 MG tablet, Take 1 tablet by mouth daily., Disp: , Rfl:  .  magnesium citrate SOLN, Take 1 Bottle by mouth as needed. , Disp: , Rfl:  .  metoprolol succinate (TOPROL-XL) 25 MG 24 hr tablet, TAKE 1 TABLET BY MOUTH IN THE EVENING MAY TAKE AN ADDITIONAL DOSE WITH ONSET OF PALPITATIONS, Disp: 30 tablet, Rfl: 6 .  Multiple Vitamin (MULTIVITAMIN) tablet, Take 1 tablet by mouth daily.  , Disp: , Rfl:  .  Propylene Glycol (SYSTANE BALANCE) 0.6 % SOLN, Apply 1 drop to eye 2 (two) times daily. For dry eyes, takes along with restasis, Disp: , Rfl:  .  rOPINIRole (REQUIP) 0.25 MG tablet, Take 0.25 mg by mouth at bedtime., Disp: , Rfl:  .  valACYclovir (VALTREX) 500 MG tablet, TAKE 1 TABLET BY MOUTH EVERY DAY (Patient taking differently: TAKE 1 TABLET BY MOUTH PRN),  Disp: 90 tablet, Rfl: 0  Allergies  Allergen Reactions  . Ambien [Zolpidem Tartrate] Other (See Comments)    Causes Neurological problems with very bad dizziness, pains, disorientation  . Cortisone Other (See Comments)    REACTION: "mania" post ESI  . Eszopiclone And Related Other (See Comments)    Causes Neurological problems with very bad dizziness, pains, disorientation  . Levofloxacin Other (See Comments)    Muscle spasms and jerking movements  . Dicloxacillin Other (See Comments)    Stomach problems, swelling of tongue  . Dilantin [Phenytoin Sodium Extended] Hives and Itching  . Erythromycin Other (See Comments)    Tears stomach up  . Avelox [Moxifloxacin Hcl In Nacl]     Pt unsure of  reaction.  . Other     Celiac disease  . Nitrofuran Derivatives Other (See Comments)    unknown  . Phenytoin Sodium Extended Rash  . Trazodone And Nefazodone Other (See Comments)    unknown   Review of Systems   Review of Systems  Constitutional: Negative for chills, fever and malaise/fatigue.  HENT: Negative for ear pain, sinus pain and sore throat.   Eyes: Negative for blurred vision and double vision.  Respiratory: Negative for cough, shortness of breath and wheezing.   Cardiovascular: Negative for chest pain, palpitations and leg swelling.  Gastrointestinal: Negative for abdominal pain, diarrhea and vomiting.  Genitourinary: Negative for dysuria.  Musculoskeletal: Negative for back pain, joint pain and neck pain.  Neurological: Negative for dizziness and headaches.  Psychiatric/Behavioral: Negative for depression, hallucinations and memory loss.   Vitals:   Vitals:   09/26/16 0944  BP: 134/84  Pulse: 65  Resp: 14  Temp: 98.1 F (36.7 C)  TempSrc: Oral  SpO2: 97%  Weight: 160 lb 6.4 oz (72.8 kg)  Height: 5' 5"  (1.651 m)     Body mass index is 26.69 kg/m.   Physical Exam:   Physical Exam  Constitutional: She appears well-nourished.  HENT:  Head: Normocephalic and atraumatic.  Eyes: EOM are normal. Pupils are equal, round, and reactive to light.  Neck: Normal range of motion. Neck supple.  Cardiovascular: Normal rate, regular rhythm, normal heart sounds and intact distal pulses.   Pulmonary/Chest: Effort normal.  Abdominal: Soft.  Skin: Skin is warm.  Psychiatric: She has a normal mood and affect. Her behavior is normal.  Nursing note and vitals reviewed.   Assessment and Plan:   Amanda [name] was seen today for medicare wellness and follow-up.  Diagnoses and all orders for this visit:  Hyperlipidemia with target LDL less than 130 Comments: Labs pending. Orders: -     Lipid panel  Encounter for hepatitis C screening test for low risk patient -      Hepatitis C antibody  Encounter for Medicare annual wellness exam Comments: Reviewed Health Coach note. Agree with plan.    . Reviewed expectations re: course of current medical issues. . Discussed self-management of symptoms. . Outlined signs and symptoms indicating need for more acute intervention. . Patient verbalized understanding and all questions were answered. [name] Health Maintenance issues including appropriate healthy diet, exercise, and smoking avoidance were discussed with patient. . See orders for this visit as documented in the electronic medical record. . Patient received an After Visit Summary.  Amanda [name] served as Education administrator during this visit. History, Physical, and Plan performed by medical provider. The above documentation has been reviewed and is accurate and complete. Briscoe Deutscher, D.O.   Briscoe Deutscher, DO [place], [facility] 09/26/2016  Future Appointments Date Time Provider [place]  03/29/2017 1:45 PM Briscoe Deutscher, DO [facility] None  09/27/2017 9:00 AM Riki Sheer, Tyler Aas, RN [facility] None

## 2016-09-26 NOTE — Progress Notes (Signed)
Subjective:   Amanda [name] is a 66 y.o. female who presents for an Initial Medicare Annual Wellness Visit.  Review of Systems    No ROS.  Medicare Wellness Visit.  Cardiac Risk Factors include: advanced age (>25mn, >>32women);dyslipidemia   Sleep patterns: Sleeps 6-8 hrs/night. Gets up to use restroom once. Wakes up feeling tired. Home Safety/Smoke Alarms:  Feels safe in home. Smoke alarms in place.  Living environment; residence and Firearm Safety: Lives in 3 story home. Lives with husband. 2 dogs.  Seat Belt Safety/Bike Helmet: Wears seat belt.   Counseling:   Eye Exam- Yearly. Dental- Every 6 months.  Female:   Pap- 02/25/2016      Mammo-  03/18/2016  Normal Dexa scan- 06/15/2015 2 year recall.       CCS- 06/09/2015  3 year recall.     Objective:    Today's Vitals   09/26/16 0944  BP: 134/84  Pulse: 65  Resp: 14  Temp: 98.1 F (36.7 C)  TempSrc: Oral  SpO2: 97%  Weight: 160 lb 6.4 oz (72.8 kg)  Height: 5' 5"  (1.651 m)   Body mass index is 26.69 kg/m.   Current Medications (verified) Outpatient Encounter Prescriptions as of 09/26/2016  Medication Sig  . APPLE CIDER VINEGAR PO Take 1 capsule by mouth 2 (two) times daily. Reported on 06/09/2015  . Atorvastatin Calcium (LIPITOR PO) Take 20 mg by mouth every other day.   .Marland KitchenCALCIUM-VITAMIN D PO Take 2 tablets by mouth daily.  . clonazePAM (KLONOPIN) 1 MG tablet Take 0.5 mg by mouth at bedtime.  . CO ENZYME Q-10 PO Take by mouth daily.   . cycloSPORINE (RESTASIS) 0.05 % ophthalmic emulsion Place 1 drop into both eyes 2 (two) times daily.  . diclofenac (VOLTAREN) 75 MG EC tablet Take 1 tablet (75 mg total) by mouth 2 (two) times daily. Take 1 tab bid X 10 days then as needed  . escitalopram (LEXAPRO) 20 MG tablet Take 1 tablet by mouth daily.  . magnesium citrate SOLN Take 1 Bottle by mouth as needed.   . metoprolol succinate (TOPROL-XL) 25 MG 24 hr tablet TAKE 1 TABLET BY MOUTH IN THE EVENING MAY TAKE AN  ADDITIONAL DOSE WITH ONSET OF PALPITATIONS  . Multiple Vitamin (MULTIVITAMIN) tablet Take 1 tablet by mouth daily.    .Marland KitchenPropylene Glycol (SYSTANE BALANCE) 0.6 % SOLN Apply 1 drop to eye 2 (two) times daily. For dry eyes, takes along with restasis  . rOPINIRole (REQUIP) 0.25 MG tablet Take 0.25 mg by mouth at bedtime.  . valACYclovir (VALTREX) 500 MG tablet TAKE 1 TABLET BY MOUTH EVERY DAY (Patient taking differently: TAKE 1 TABLET BY MOUTH PRN)   No facility-administered encounter medications on file as of 09/26/2016.     Allergies (verified) Ambien [zolpidem tartrate]; Cortisone; Eszopiclone and related; Levofloxacin; Dicloxacillin; Dilantin [phenytoin sodium extended]; Erythromycin; Avelox [moxifloxacin hcl in nacl]; Other; Nitrofuran derivatives; Phenytoin sodium extended; and Trazodone and nefazodone   History: Past Medical History:  Diagnosis Date  . Anxiety   . Bruises easily   . Celiac disease    Gluten free diet  . Change in voice   . Elevated cholesterol   . GERD (gastroesophageal reflux disease)   . Hiatal hernia   . Hyperlipidemia   . Insomnia   . Myalgia and myositis, unspecified   . Osteoarthrosis, unspecified whether generalized or localized, unspecified site   . Osteopenia 06/2015   T score -2.1 FRAX 9.4%/1.3% stable from prior DEXA  .  Other abnormal blood chemistry   . Other symptoms involving cardiovascular system   . Reflux   . Restless leg syndrome   . STD (sexually transmitted disease)    HSV  . 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 Thyroid cancer ([place])   . Vertigo    Past Surgical History:  Procedure Laterality Date  . ABDOMINAL HYSTERECTOMY  1986   TAH  . BRAIN SURGERY  2010   hemorrhage  . BREAST SURGERY  1999   Reduction  . COLONOSCOPY    . ESOPHAGOGASTRODUODENOSCOPY    . KNEE SURGERY  2002   both knees  . NM MYOVIEW LTD  November 2012   No ischemia or infarction  . THYROID SURGERY     Partial thyroidectomy    . TRANSTHORACIC ECHOCARDIOGRAM   April 2013; 01/18/2013   Grade 1 diastolic dysfunction; normal EF 60-65%. No significant valve disease   Family History  Problem Relation Age of Onset  . Diabetes Maternal Aunt   . Breast cancer Maternal Aunt        Maternal Great aunt-Age 34's  . Liver cancer Father   . Heart disease Maternal Grandfather   . Hyperlipidemia Maternal Grandfather   . Hypertension Paternal Grandfather   . Colon cancer Neg Hx    Social History   Occupational History  . Not on file.   Social History Main Topics  . Smoking status: Never Smoker  . Smokeless tobacco: Never Used  . Alcohol use 1.2 - 1.8 oz/week    1 Standard drinks or equivalent, 1 - 2 Cans of beer per week  . Drug use: No  . Sexual activity: Yes    Partners: Male    Birth control/ protection: Surgical     Comment: HYST-1st intercourse 66 yo-Fewer than 5 partners    Tobacco Counseling Counseling given: Not Answered   Activities of Daily Living In your present state of health, do you have any difficulty performing the following activities: 09/26/2016  Hearing? N  Vision? N  Difficulty concentrating or making decisions? N  Walking or climbing stairs? N  Dressing or bathing? N  Doing errands, shopping? N  Preparing Food and eating ? N  Using the Toilet? N  In the past six months, have you accidently leaked urine? Y  Do you have problems with loss of bowel control? N  Managing your Medications? N  Managing your Finances? N  Housekeeping or managing your Housekeeping? N  Some recent data might be hidden    Immunizations and Health Maintenance Immunization History  Administered Date(s) Administered  . Td 09/26/2008, 02/19/2015   Health Maintenance Due  Topic Date Due  . Hepatitis C Screening  [birth year]  . PNA vac Low Risk Adult (1 of 2 - PCV13) 04/16/2015    Patient Care Team: Briscoe Deutscher, DO as PCP - General (Family Medicine)  Indicate any recent Medical Services you may have  received from other than [facility] providers in the past year (date may be approximate).     Assessment:   This is a routine wellness examination for [place]. Physical assessment deferred to PCP.  Hearing/Vision screen Hearing Screening Comments: Able to hear conversational tones w/o difficulty. No issues reported.   Vision Screening Comments: Every year. [facility] on [address]. Wears bifocals.  Dietary issues and exercise activities discussed: Current Exercise Habits: Structured exercise class, Type of exercise: strength training/weights;walking;Other - see comments;stretching;treadmill;yoga (Bicycle), Time (Minutes): > 60, Frequency (Times/Week): 5, Weekly Exercise (Minutes/Week):  0, Intensity: Moderate, Exercise limited by: None identified   Diet (meal preparation, eat out, water intake, caffeinated beverages, dairy products, fruits and vegetables): Pt states she eats healthy. Eats two meals per day, mostly salads, roasted chicken. Most food is cooked at home.   Goals    . Maintain current health status      Depression Screen PHQ 2/9 Scores 09/26/2016 02/19/2016  PHQ - 2 Score 0 0  PHQ- 9 Score 0 -    Fall Risk Fall Risk  09/26/2016 02/19/2016 12/11/2015 03/11/2015  Falls in the past year? No No No No    Cognitive Function: Ad8 score reviewed for issues:  Issues making decisions:no  Less interest in hobbies / activities:no  Repeats questions, stories (family complaining):no  Trouble using ordinary gadgets (microwave, computer, phone):no  Forgets the month or year: no  Mismanaging finances: no  Remembering appts:no  Daily problems with thinking and/or memory:no Ad8 score is=0   MMSE - Mini Mental State Exam 12/11/2015 03/11/2015  Orientation to time 5 5  Orientation to Place 5 5  Registration 3 3  Attention/ Calculation 5 4  Recall 3 3  Language- name 2 objects 2 2  Language- repeat 1 1  Language- follow 3 step command 3 3  Language- read & follow direction 1 1    Write a sentence 1 1  Copy design 1 1  Total score 30 29        Screening Tests Health Maintenance  Topic Date Due  . Hepatitis C Screening  [birth year]  . PNA vac Low Risk Adult (1 of 2 - PCV13) 04/16/2015  . PAP SMEAR  07/10/2017 (Originally 02/24/2017)  . INFLUENZA VACCINE  12/07/2016  . MAMMOGRAM  03/18/2018  . COLONOSCOPY  06/08/2018  . TETANUS/TDAP  02/18/2025  . DEXA SCAN  Completed      Plan:     Bring a copy of your advance directives to your next office visit. Discuss Pneumonia Vaccine with PCP. Follow up with PCP as directed.  I have personally reviewed and noted the following in the patient's chart:   . Medical and social history . Use of alcohol, tobacco or illicit drugs  . Current medications and supplements . Functional ability and status . Nutritional status . Physical activity . Advanced directives . List of other physicians . Vitals . Screenings to include cognitive, depression, and falls . Referrals and appointments  In addition, I have reviewed and discussed with patient certain preventive protocols, quality metrics, and best practice recommendations. A written personalized care plan for preventive services as well as general preventive health recommendations were provided to patient.     Ree Edman, RN   09/26/2016    I have personally reviewed the Medicare Annual Wellness questionnaire and have noted 1. The patient's medical and social history 2. Their use of alcohol, tobacco or illicit drugs 3. Their current medications and supplements 4. The patient's functional ability including ADL's, fall risks, home safety risks and hearing or visual impairment. 5. Diet and physical activities 6. Evidence for depression or mood disorders 7. Reviewed Updated provider list, see scanned forms and [facility] Snapshot.   The patients weight, height, BMI and visual acuity have been recorded in the chart I have made referrals, counseling and provided  education to the patient based review of the above and I have provided the pt with a written personalized care plan for preventive services.  I have provided the patient with a copy of your personalized plan for preventive  services. Instructed to take the time to review along with their updated medication list.  Briscoe Deutscher, D.O. [place], [facility]

## 2016-09-26 NOTE — Progress Notes (Signed)
Pre visit review using our clinic review tool, if applicable. No additional management support is needed unless otherwise documented below in the visit note. 

## 2016-09-27 LAB — HEPATITIS C ANTIBODY: HCV Ab: NEGATIVE

## 2016-09-30 DIAGNOSIS — L821 Other seborrheic keratosis: Secondary | ICD-10-CM | POA: Diagnosis not present

## 2016-09-30 DIAGNOSIS — Z85828 Personal history of other malignant neoplasm of skin: Secondary | ICD-10-CM | POA: Diagnosis not present

## 2016-09-30 DIAGNOSIS — L723 Sebaceous cyst: Secondary | ICD-10-CM | POA: Diagnosis not present

## 2016-10-05 DIAGNOSIS — H04123 Dry eye syndrome of bilateral lacrimal glands: Secondary | ICD-10-CM | POA: Diagnosis not present

## 2016-10-05 DIAGNOSIS — H43812 Vitreous degeneration, left eye: Secondary | ICD-10-CM | POA: Diagnosis not present

## 2016-10-05 DIAGNOSIS — H2513 Age-related nuclear cataract, bilateral: Secondary | ICD-10-CM | POA: Diagnosis not present

## 2016-10-11 ENCOUNTER — Encounter: Payer: Self-pay | Admitting: Physician Assistant

## 2016-10-11 ENCOUNTER — Ambulatory Visit (INDEPENDENT_AMBULATORY_CARE_PROVIDER_SITE_OTHER): Payer: Medicare Other | Admitting: Physician Assistant

## 2016-10-11 VITALS — BP 130/86 | HR 76 | Temp 99.0°F | Ht 65.0 in | Wt 161.2 lb

## 2016-10-11 DIAGNOSIS — N3090 Cystitis, unspecified without hematuria: Secondary | ICD-10-CM

## 2016-10-11 DIAGNOSIS — R350 Frequency of micturition: Secondary | ICD-10-CM | POA: Diagnosis not present

## 2016-10-11 DIAGNOSIS — R109 Unspecified abdominal pain: Secondary | ICD-10-CM | POA: Diagnosis not present

## 2016-10-11 LAB — POCT URINALYSIS DIPSTICK
Bilirubin, UA: NEGATIVE
Blood, UA: NEGATIVE
Glucose, UA: NEGATIVE
Ketones, UA: NEGATIVE
Nitrite, UA: NEGATIVE
PH UA: 7 (ref 5.0–8.0)
PROTEIN UA: NEGATIVE
SPEC GRAV UA: 1.015 (ref 1.010–1.025)
UROBILINOGEN UA: 0.2 U/dL

## 2016-10-11 MED ORDER — DOXYCYCLINE HYCLATE 100 MG PO TABS
100.0000 mg | ORAL_TABLET | Freq: Two times a day (BID) | ORAL | 0 refills | Status: DC
Start: 2016-10-11 — End: 2017-08-29

## 2016-10-11 NOTE — Patient Instructions (Signed)
Start doxycycline for your symptoms. We will culture your urine and let you know if you need a different antibiotic. We will call you with your lab results.   Urinary Tract Infection, Adult A urinary tract infection (UTI) is an infection of any part of the urinary tract, which includes the kidneys, ureters, bladder, and urethra. These organs make, store, and get rid of urine in the body. UTI can be a bladder infection (cystitis) or kidney infection (pyelonephritis). What are the causes? This infection may be caused by fungi, viruses, or bacteria. Bacteria are the most common cause of UTIs. This condition can also be caused by repeated incomplete emptying of the bladder during urination. What increases the risk? This condition is more likely to develop if:  You ignore your need to urinate or hold urine for long periods of time.  You do not empty your bladder completely during urination.  You wipe back to front after urinating or having a bowel movement, if you are female.  You are uncircumcised, if you are female.  You are constipated.  You have a urinary catheter that stays in place (indwelling).  You have a weak defense (immune) system.  You have a medical condition that affects your bowels, kidneys, or bladder.  You have diabetes.  You take antibiotic medicines frequently or for long periods of time, and the antibiotics no longer work well against certain types of infections (antibiotic resistance).  You take medicines that irritate your urinary tract.  You are exposed to chemicals that irritate your urinary tract.  You are female.  What are the signs or symptoms? Symptoms of this condition include:  Fever.  Frequent urination or passing small amounts of urine frequently.  Needing to urinate urgently.  Pain or burning with urination.  Urine that smells bad or unusual.  Cloudy urine.  Pain in the lower abdomen or back.  Trouble urinating.  Blood in the  urine.  Vomiting or being less hungry than normal.  Diarrhea or abdominal pain.  Vaginal discharge, if you are female.  How is this diagnosed? This condition is diagnosed with a medical history and physical exam. You will also need to provide a urine sample to test your urine. Other tests may be done, including:  Blood tests.  Sexually transmitted disease (STD) testing.  If you have had more than one UTI, a cystoscopy or imaging studies may be done to determine the cause of the infections. How is this treated? Treatment for this condition often includes a combination of two or more of the following:  Antibiotic medicine.  Other medicines to treat less common causes of UTI.  Over-the-counter medicines to treat pain.  Drinking enough water to stay hydrated.  Follow these instructions at home:  Take over-the-counter and prescription medicines only as told by your health care provider.  If you were prescribed an antibiotic, take it as told by your health care provider. Do not stop taking the antibiotic even if you start to feel better.  Avoid alcohol, caffeine, tea, and carbonated beverages. They can irritate your bladder.  Drink enough fluid to keep your urine clear or pale yellow.  Keep all follow-up visits as told by your health care provider. This is important.  Make sure to: ? Empty your bladder often and completely. Do not hold urine for long periods of time. ? Empty your bladder before and after sex. ? Wipe from front to back after a bowel movement if you are female. Use each tissue one time  when you wipe. Contact a health care provider if:  You have back pain.  You have a fever.  You feel nauseous or vomit.  Your symptoms do not get better after 3 days.  Your symptoms go away and then return. Get help right away if:  You have severe back pain or lower abdominal pain.  You are vomiting and cannot keep down any medicines or water. This information is not  intended to replace advice given to you by your health care provider. Make sure you discuss any questions you have with your health care provider. Document Released: 02/02/2005 Document Revised: 10/07/2015 Document Reviewed: 03/16/2015 Elsevier Interactive Patient Education  2017 [employer].

## 2016-10-11 NOTE — Progress Notes (Signed)
Amanda [name] is a 66 y.o. female here for a bladder infection  I acted as a Education administrator for [employer], PA-C Amanda Pickler, LPN  History of Present Illness:   Chief Complaint  Patient presents with  . Back Pain  . Right flank pain  . urgency and frequency with urination    Flank Pain  This is a new problem. Episode onset: x 2 days. The problem occurs intermittently. The problem has been gradually worsening since onset. Pain location: Right flank area. The quality of the pain is described as aching. The pain does not radiate. The pain is at a severity of 0/10. The patient is experiencing no pain. The pain is worse during the night. The symptoms are aggravated by sitting. Stiffness is present in the morning. Pertinent negatives include no abdominal pain, bladder incontinence, bowel incontinence, chest pain, dysuria, fever, headaches, leg pain, numbness, pelvic pain, weakness or weight loss. Risk factors include history of cancer and menopause. She has tried heat (and apple cider vinegar) for the symptoms. The treatment provided mild relief.  Urinary Frequency   This is a new problem. Episode onset: x 3-4 days. The problem occurs every urination. The problem has been gradually worsening. The pain is at a severity of 0/10. The patient is experiencing no pain. There has been no fever. She is sexually active. There is no history of pyelonephritis. Associated symptoms include flank pain, frequency and urgency. Pertinent negatives include no chills, discharge, hematuria, hesitancy, nausea, possible pregnancy, sweats or vomiting. She has tried nothing for the symptoms. Her past medical history is significant for recurrent UTIs.    PMHx, SurgHx, SocialHx, Medications, and Allergies were reviewed in the Visit Navigator and updated as appropriate.  Current Medications:   Current Outpatient Prescriptions:  .  APPLE CIDER VINEGAR PO, Take 1 capsule by mouth 2 (two) times daily. Reported on  06/09/2015, Disp: , Rfl:  .  Atorvastatin Calcium (LIPITOR PO), Take 20 mg by mouth every other day. , Disp: , Rfl:  .  CALCIUM-VITAMIN D PO, Take 2 tablets by mouth daily., Disp: , Rfl:  .  clonazePAM (KLONOPIN) 1 MG tablet, Take 0.5 mg by mouth at bedtime., Disp: , Rfl:  .  CO ENZYME Q-10 PO, Take by mouth daily. , Disp: , Rfl:  .  cycloSPORINE (RESTASIS) 0.05 % ophthalmic emulsion, Place 1 drop into both eyes 2 (two) times daily., Disp: , Rfl:  .  diclofenac (VOLTAREN) 75 MG EC tablet, Take 1 tablet (75 mg total) by mouth 2 (two) times daily. Take 1 tab bid X 10 days then as needed, Disp: 30 tablet, Rfl: 0 .  escitalopram (LEXAPRO) 20 MG tablet, Take 1 tablet by mouth daily., Disp: , Rfl:  .  magnesium citrate SOLN, Take 1 Bottle by mouth as needed. , Disp: , Rfl:  .  metoprolol succinate (TOPROL-XL) 25 MG 24 hr tablet, TAKE 1 TABLET BY MOUTH IN THE EVENING MAY TAKE AN ADDITIONAL DOSE WITH ONSET OF PALPITATIONS, Disp: 30 tablet, Rfl: 6 .  Multiple Vitamin (MULTIVITAMIN) tablet, Take 1 tablet by mouth daily.  , Disp: , Rfl:  .  Propylene Glycol (SYSTANE BALANCE) 0.6 % SOLN, Apply 1 drop to eye 2 (two) times daily. For dry eyes, takes along with restasis, Disp: , Rfl:  .  rOPINIRole (REQUIP) 0.25 MG tablet, Take 0.25 mg by mouth at bedtime., Disp: , Rfl:  .  valACYclovir (VALTREX) 500 MG tablet, TAKE 1 TABLET BY MOUTH EVERY DAY (Patient taking differently: TAKE  1 TABLET BY MOUTH PRN), Disp: 90 tablet, Rfl: 0 .  doxycycline (VIBRA-TABS) 100 MG tablet, Take 1 tablet (100 mg total) by mouth 2 (two) times daily., Disp: 20 tablet, Rfl: 0   Review of Systems:   Review of Systems  Constitutional: Negative for chills, fever and weight loss.  Cardiovascular: Negative for chest pain.  Gastrointestinal: Negative for abdominal pain, bowel incontinence, nausea and vomiting.  Genitourinary: Positive for flank pain, frequency and urgency. Negative for bladder incontinence, dysuria, hematuria, hesitancy and  pelvic pain.  Neurological: Negative for weakness, numbness and headaches.    Vitals:   Vitals:   10/11/16 1448  BP: 130/86  Pulse: 76  Temp: 99 F (37.2 C)  TempSrc: Oral  SpO2: 96%  Weight: 161 lb 4 oz (73.1 kg)  Height: 5' 5"  (1.651 m)     Body mass index is 26.83 kg/m.  Physical Exam:   Physical Exam  Constitutional: She appears well-developed. She is cooperative.  Non-toxic appearance. She does not have a sickly appearance. She does not appear ill. No distress.  Cardiovascular: Normal rate, regular rhythm, S1 normal, S2 normal, normal heart sounds and normal pulses.   No LE edema  Pulmonary/Chest: Effort normal and breath sounds normal.  Abdominal: There is no tenderness. There is no CVA tenderness.  Neurological: She is alert.  Nursing note and vitals reviewed.   Results for orders placed or performed in visit on 10/11/16  POCT urinalysis dipstick  Result Value Ref Range   Color, UA Yellow    Clarity, UA Clear    Glucose, UA Negative    Bilirubin, UA Negative    Ketones, UA Negative    Spec Grav, UA 1.015 1.010 - 1.025   Blood, UA Negative    pH, UA 7.0 5.0 - 8.0   Protein, UA Negative    Urobilinogen, UA 0.2 0.2 or 1.0 E.U./dL   Nitrite, UA Negative    Leukocytes, UA Small (1+) (A) Negative    Assessment and Plan:    [name] was seen today for back pain, right flank pain and urgency and frequency with urination.  Diagnoses and all orders for this visit:  Cystitis -     POCT urinalysis dipstick -     CBC with Differential/Platelet -     Comprehensive metabolic panel -     Urine culture  Right flank pain -     POCT urinalysis dipstick -     CBC with Differential/Platelet -     Comprehensive metabolic panel  Other orders -     Cancel: POCT urinalysis dipstick -     doxycycline (VIBRA-TABS) 100 MG tablet; Take 1 tablet (100 mg total) by mouth 2 (two) times daily.   Urine with mild leukocytes, however patient has a history of recurrent UTIs, so  I have a low threshold to treat. She does not have a history of kidney infection that she knows of. Vital signs are stable. I was unable to reproduce her back pain and she says that this has resolved at this time. She has multiple allergies so we will treat with doxycycline per orders. I will send a culture for her urine and evaluate if this is the effective antibiotic for her. I gave her a list of warning signs to review and follow-up with [place] if any issues were to arise. I will also check her CMP and CBC as this has not been done in a while, and she reports that she wants to check her kidney  function and blood counts to make sure she doesn't have significant infection.  . Reviewed expectations re: course of current medical issues. . Discussed self-management of symptoms. . Outlined signs and symptoms indicating need for more acute intervention. . Patient verbalized understanding and all questions were answered. . See orders for this visit as documented in the electronic medical record. . Patient received an After-Visit Summary.  CMA or LPN served as scribe during this visit. History, Physical, and Plan performed by medical provider. Documentation and orders reviewed and attested to.  Inda Coke, PA-C

## 2016-10-12 LAB — CBC WITH DIFFERENTIAL/PLATELET
BASOS PCT: 1.1 % (ref 0.0–3.0)
Basophils Absolute: 0.1 10*3/uL (ref 0.0–0.1)
EOS PCT: 2.6 % (ref 0.0–5.0)
Eosinophils Absolute: 0.1 10*3/uL (ref 0.0–0.7)
HCT: 38.3 % (ref 36.0–46.0)
HEMOGLOBIN: 12.8 g/dL (ref 12.0–15.0)
LYMPHS ABS: 1.5 10*3/uL (ref 0.7–4.0)
Lymphocytes Relative: 30 % (ref 12.0–46.0)
MCHC: 33.5 g/dL (ref 30.0–36.0)
MCV: 91.5 fl (ref 78.0–100.0)
MONO ABS: 0.4 10*3/uL (ref 0.1–1.0)
Monocytes Relative: 7.6 % (ref 3.0–12.0)
NEUTROS ABS: 2.9 10*3/uL (ref 1.4–7.7)
NEUTROS PCT: 58.7 % (ref 43.0–77.0)
PLATELETS: 212 10*3/uL (ref 150.0–400.0)
RBC: 4.18 Mil/uL (ref 3.87–5.11)
RDW: 13.2 % (ref 11.5–15.5)
WBC: 4.9 10*3/uL (ref 4.0–10.5)

## 2016-10-12 LAB — COMPREHENSIVE METABOLIC PANEL
ALBUMIN: 4.6 g/dL (ref 3.5–5.2)
ALT: 24 U/L (ref 0–35)
AST: 27 U/L (ref 0–37)
Alkaline Phosphatase: 70 U/L (ref 39–117)
BUN: 22 mg/dL (ref 6–23)
CHLORIDE: 104 meq/L (ref 96–112)
CO2: 29 mEq/L (ref 19–32)
Calcium: 10 mg/dL (ref 8.4–10.5)
Creatinine, Ser: 0.95 mg/dL (ref 0.40–1.20)
GFR: 62.46 mL/min (ref >60.00)
Glucose, Bld: 96 mg/dL (ref 70–99)
POTASSIUM: 4.6 meq/L (ref 3.5–5.1)
SODIUM: 140 meq/L (ref 135–145)
Total Bilirubin: 0.6 mg/dL (ref 0.2–1.2)
Total Protein: 7.3 g/dL (ref 6.0–8.3)

## 2016-10-12 LAB — URINE CULTURE

## 2016-10-14 DIAGNOSIS — L821 Other seborrheic keratosis: Secondary | ICD-10-CM | POA: Diagnosis not present

## 2016-10-14 DIAGNOSIS — L720 Epidermal cyst: Secondary | ICD-10-CM | POA: Diagnosis not present

## 2016-10-14 DIAGNOSIS — D1801 Hemangioma of skin and subcutaneous tissue: Secondary | ICD-10-CM | POA: Diagnosis not present

## 2016-10-14 DIAGNOSIS — L853 Xerosis cutis: Secondary | ICD-10-CM | POA: Diagnosis not present

## 2016-10-14 DIAGNOSIS — D2262 Melanocytic nevi of left upper limb, including shoulder: Secondary | ICD-10-CM | POA: Diagnosis not present

## 2016-10-14 DIAGNOSIS — D225 Melanocytic nevi of trunk: Secondary | ICD-10-CM | POA: Diagnosis not present

## 2016-10-14 DIAGNOSIS — L820 Inflamed seborrheic keratosis: Secondary | ICD-10-CM | POA: Diagnosis not present

## 2016-10-14 DIAGNOSIS — D2271 Melanocytic nevi of right lower limb, including hip: Secondary | ICD-10-CM | POA: Diagnosis not present

## 2016-10-14 DIAGNOSIS — D2261 Melanocytic nevi of right upper limb, including shoulder: Secondary | ICD-10-CM | POA: Diagnosis not present

## 2016-10-14 DIAGNOSIS — D2272 Melanocytic nevi of left lower limb, including hip: Secondary | ICD-10-CM | POA: Diagnosis not present

## 2016-10-14 DIAGNOSIS — Z85828 Personal history of other malignant neoplasm of skin: Secondary | ICD-10-CM | POA: Diagnosis not present

## 2016-10-14 DIAGNOSIS — L812 Freckles: Secondary | ICD-10-CM | POA: Diagnosis not present

## 2016-10-20 DIAGNOSIS — M4692 Unspecified inflammatory spondylopathy, cervical region: Secondary | ICD-10-CM | POA: Diagnosis not present

## 2016-10-20 DIAGNOSIS — M5136 Other intervertebral disc degeneration, lumbar region: Secondary | ICD-10-CM | POA: Diagnosis not present

## 2016-10-28 DIAGNOSIS — G5601 Carpal tunnel syndrome, right upper limb: Secondary | ICD-10-CM | POA: Diagnosis not present

## 2016-10-31 ENCOUNTER — Ambulatory Visit (INDEPENDENT_AMBULATORY_CARE_PROVIDER_SITE_OTHER): Payer: Medicare Other | Admitting: Gynecology

## 2016-10-31 ENCOUNTER — Encounter: Payer: Self-pay | Admitting: Gynecology

## 2016-10-31 VITALS — BP 122/74

## 2016-10-31 DIAGNOSIS — G8929 Other chronic pain: Secondary | ICD-10-CM

## 2016-10-31 DIAGNOSIS — R1031 Right lower quadrant pain: Secondary | ICD-10-CM | POA: Diagnosis not present

## 2016-10-31 NOTE — Progress Notes (Signed)
    Amanda [name] 03-04-1951 [id number]        66 y.o.  G2P2002 complaining of mid suprapubic to right lower quadrant discomfort. Comes and goes. Sharp stabbing to aching.  No nausea vomiting diarrhea. Does have some constipation but notes this is chronic in nature. No urinary symptoms such as frequency dysuria or urgency low back pain fever or chills.  Had similar complaints when she saw Amanda [name] 2017. Follow up ultrasound overall was normal noting 2 small echo free ovarian cysts at 5 and 6 mm. History of TAH in the past.  Of note Amanda's note had stated she had an appendectomy which I carried forward with her history but patient today states she never had an appendectomy before that she is aware of.  Past medical history,surgical history, problem list, medications, allergies, family history and social history were all reviewed and documented in the EPIC chart.  Directed ROS with pertinent positives and negatives documented in the history of present illness/assessment and plan.  Exam: Amanda [name] assistant Vitals:   10/31/16 1538  BP: 122/74   General appearance:  Normal Abdomen soft nontender without masses guarding rebound Pelvic external BUS vagina with atrophic changes. No masses or tenderness on bimanual. Rectal exam is normal.  Assessment/Plan:  66 y.o. G2P2002 with chronic mid to right lower quadrant discomfort. I suspect this is more GI related. Status post hysterectomy in the past. Will recheck ovarian status with ultrasound. Assuming negative then recommend she follow up with gastroenterology. Check urinalysis today. Follow up for ultrasound and triage.    [name], 3:57 PM 10/31/2016

## 2016-10-31 NOTE — Patient Instructions (Signed)
Follow up for ultrasound as scheduled 

## 2016-11-01 LAB — URINALYSIS W MICROSCOPIC + REFLEX CULTURE
BACTERIA UA: NONE SEEN [HPF]
BILIRUBIN URINE: NEGATIVE
CRYSTALS: NONE SEEN [HPF]
Casts: NONE SEEN [LPF]
Glucose, UA: NEGATIVE
Hgb urine dipstick: NEGATIVE
Ketones, ur: NEGATIVE
Leukocytes, UA: NEGATIVE
Nitrite: NEGATIVE
PROTEIN: NEGATIVE
RBC / HPF: NONE SEEN RBC/HPF (ref <=2)
Specific Gravity, Urine: 1.009 (ref 1.001–1.035)
Squamous Epithelial / LPF: NONE SEEN [HPF] (ref <=5)
WBC UA: NONE SEEN WBC/HPF (ref <=5)
Yeast: NONE SEEN [HPF]
pH: 7 (ref 5.0–8.0)

## 2016-11-14 ENCOUNTER — Telehealth: Payer: Self-pay | Admitting: Family Medicine

## 2016-11-14 DIAGNOSIS — G5601 Carpal tunnel syndrome, right upper limb: Secondary | ICD-10-CM | POA: Diagnosis not present

## 2016-11-14 NOTE — Telephone Encounter (Signed)
**  Remind patient they can make refill requests via MyChart**  Medication refill request (Name & Dosage): clonazePAM (KLONOPIN) 1 MG tablet [[id number]]     Preferred pharmacy (Name & Address):  [id number] [place] [phone](Phone) [phone](Fax)      Other comments (if applicable):

## 2016-11-14 NOTE — Telephone Encounter (Signed)
Refill called in to patient's pharmacy.

## 2016-11-14 NOTE — Telephone Encounter (Signed)
Ok to fill 

## 2016-11-14 NOTE — Telephone Encounter (Signed)
Yes

## 2016-11-15 ENCOUNTER — Telehealth: Payer: Self-pay | Admitting: Family Medicine

## 2016-11-15 NOTE — Telephone Encounter (Signed)
Spoke with pharmacy.  Needed quantity and # of refills.  Gave verbal 30, 0 refills.

## 2016-11-15 NOTE — Telephone Encounter (Signed)
Pharmacy needs clarification on the clonazePAM (KLONOPIN) 1 MG tablet. Call pharmacy to advise.

## 2016-11-16 ENCOUNTER — Ambulatory Visit (INDEPENDENT_AMBULATORY_CARE_PROVIDER_SITE_OTHER): Payer: Medicare Other | Admitting: Gynecology

## 2016-11-16 ENCOUNTER — Encounter: Payer: Self-pay | Admitting: Gynecology

## 2016-11-16 ENCOUNTER — Other Ambulatory Visit: Payer: Self-pay | Admitting: Gynecology

## 2016-11-16 ENCOUNTER — Ambulatory Visit (INDEPENDENT_AMBULATORY_CARE_PROVIDER_SITE_OTHER): Payer: Medicare Other

## 2016-11-16 VITALS — BP 120/78

## 2016-11-16 DIAGNOSIS — R1031 Right lower quadrant pain: Principal | ICD-10-CM

## 2016-11-16 DIAGNOSIS — R102 Pelvic and perineal pain: Secondary | ICD-10-CM | POA: Diagnosis not present

## 2016-11-16 DIAGNOSIS — N83201 Unspecified ovarian cyst, right side: Secondary | ICD-10-CM

## 2016-11-16 DIAGNOSIS — G8929 Other chronic pain: Secondary | ICD-10-CM

## 2016-11-16 NOTE — Progress Notes (Signed)
    NARE GASPARI 28-Apr-1951 [id number]        66 y.o.  G2P2002 presents for ultrasound. History of chronic right lower quadrant discomfort that comes and goes as described in my 10/31/2016 note. History of IBS in the past. Ultrasound ordered to rule out GYN etiology.  Past medical history,surgical history, problem list, medications, allergies, family history and social history were all reviewed and documented in the EPIC chart.  Directed ROS with pertinent positives and negatives documented in the history of present illness/assessment and plan.  Exam: Vitals:   11/16/16 1139  BP: 120/78   General appearance:  Normal  Ultrasound transvaginal status post hysterectomy. Right ovary with small 8 x 9 mm echo-free thin-walled cyst. Negative color flow Doppler. Left ovary normal. Positive arterial blood flow to both right and left ovaries noted. Negative cul-de-sac. Excessive bowel activity was noted in the right adnexa.  Assessment/Plan:  66 y.o. G2P2002 with chronic right-sided pain and normal GYN ultrasound. Suspect given her history of IBS this is GI related. Recommended patient follow up with her gastroenterologist for further evaluation and treatment as needed.    [name], 11:50 AM 11/16/2016

## 2016-11-16 NOTE — Patient Instructions (Signed)
Follow up with your gastroenterologist in reference to your abdominal pain.

## 2016-11-17 LAB — URINALYSIS W MICROSCOPIC + REFLEX CULTURE
BACTERIA UA: NONE SEEN [HPF]
BILIRUBIN URINE: NEGATIVE
Casts: NONE SEEN [LPF]
Crystals: NONE SEEN [HPF]
GLUCOSE, UA: NEGATIVE
HGB URINE DIPSTICK: NEGATIVE
KETONES UR: NEGATIVE
Leukocytes, UA: NEGATIVE
Nitrite: NEGATIVE
PROTEIN: NEGATIVE
RBC / HPF: NONE SEEN RBC/HPF (ref <=2)
SQUAMOUS EPITHELIAL / LPF: NONE SEEN [HPF] (ref <=5)
Specific Gravity, Urine: 1.008 (ref 1.001–1.035)
WBC, UA: NONE SEEN WBC/HPF (ref <=5)
Yeast: NONE SEEN [HPF]
pH: 7.5 (ref 5.0–8.0)

## 2016-12-07 ENCOUNTER — Telehealth: Payer: Self-pay | Admitting: Family Medicine

## 2016-12-07 MED ORDER — ROPINIROLE HCL 0.25 MG PO TABS: 0.2500 mg | ORAL_TABLET | Freq: Every day | ORAL | 1 refills | Status: DC

## 2016-12-07 NOTE — Telephone Encounter (Signed)
Rx sent to pharmacy per Dr. [name]. Notified patient.

## 2016-12-07 NOTE — Telephone Encounter (Signed)
Patient called in reference to pharmacy not having Rx rOPINIRole (REQUIP) 0.25 MG tablet due to not having PA from Dr. [name].  [id number] [place] [phone](Phone) [phone](Fax)   Please call patient with any questions. OK to leave message.

## 2016-12-12 ENCOUNTER — Other Ambulatory Visit: Payer: Self-pay | Admitting: Cardiology

## 2016-12-12 MED ORDER — ATORVASTATIN CALCIUM 20 MG PO TABS: 20.0000 mg | ORAL_TABLET | ORAL | 1 refills | Status: DC

## 2016-12-12 NOTE — Telephone Encounter (Signed)
Rx(s) sent to pharmacy electronically.  

## 2016-12-12 NOTE — Telephone Encounter (Signed)
New message   *STAT* If patient is at the pharmacy, call can be transferred to refill team.   1. Which medications need to be refilled? (please list name of each medication and dose if known) Atorvastatin   2. Which pharmacy/location (including street and city if local pharmacy) is medication to be sent to? [facility] [address]   3. Do they need a 30 day or 90 day supply? [place]

## 2016-12-16 DIAGNOSIS — M79605 Pain in left leg: Secondary | ICD-10-CM | POA: Diagnosis not present

## 2016-12-16 DIAGNOSIS — M79604 Pain in right leg: Secondary | ICD-10-CM | POA: Diagnosis not present

## 2016-12-16 DIAGNOSIS — M79671 Pain in right foot: Secondary | ICD-10-CM | POA: Diagnosis not present

## 2016-12-16 DIAGNOSIS — M79672 Pain in left foot: Secondary | ICD-10-CM | POA: Diagnosis not present

## 2016-12-27 DIAGNOSIS — M79671 Pain in right foot: Secondary | ICD-10-CM | POA: Diagnosis not present

## 2016-12-27 DIAGNOSIS — M79672 Pain in left foot: Secondary | ICD-10-CM | POA: Diagnosis not present

## 2016-12-29 ENCOUNTER — Ambulatory Visit (INDEPENDENT_AMBULATORY_CARE_PROVIDER_SITE_OTHER): Payer: Medicare Other | Admitting: Gastroenterology

## 2016-12-29 ENCOUNTER — Encounter: Payer: Self-pay | Admitting: Gastroenterology

## 2016-12-29 VITALS — BP 122/72 | HR 66 | Ht 65.0 in | Wt 161.4 lb

## 2016-12-29 DIAGNOSIS — K5900 Constipation, unspecified: Secondary | ICD-10-CM | POA: Diagnosis not present

## 2016-12-29 DIAGNOSIS — M7751 Other enthesopathy of right foot: Secondary | ICD-10-CM | POA: Diagnosis not present

## 2016-12-29 DIAGNOSIS — M7752 Other enthesopathy of left foot: Secondary | ICD-10-CM | POA: Diagnosis not present

## 2016-12-29 DIAGNOSIS — K219 Gastro-esophageal reflux disease without esophagitis: Secondary | ICD-10-CM

## 2016-12-29 DIAGNOSIS — K900 Celiac disease: Secondary | ICD-10-CM | POA: Diagnosis not present

## 2016-12-29 DIAGNOSIS — M79671 Pain in right foot: Secondary | ICD-10-CM | POA: Diagnosis not present

## 2016-12-29 DIAGNOSIS — M79672 Pain in left foot: Secondary | ICD-10-CM | POA: Diagnosis not present

## 2016-12-29 MED ORDER — LINACLOTIDE 145 MCG PO CAPS: ORAL_CAPSULE | ORAL | 0 refills | Status: DC

## 2016-12-29 MED ORDER — POLYETHYLENE GLYCOL 3350 17 GM/SCOOP PO POWD: ORAL | 3 refills | Status: DC

## 2016-12-29 NOTE — Progress Notes (Signed)
HPI :  67 year old female here for a follow-up visit. Should a history of celiac disease, dysphagia, GERD. Please see her last clinic note for history of details of her case.  Since last visit she had a Modified barium swallow - normal, no evidence of aspiration. Mild cricopharyngeal bar. She declined EGD for dilation. She states she is not having much dysphagia which is bothering her at this time.  Placed on zantac 148m and using apple cider vinegar for reflux, working well. She denies any issues with reflux at this time.   She is on strict gluten free diet. Her last EGD showed no evidence of active celiac in January 2017 and her antibodies were negative at that time. She is compliant with gluten free diet . She has been constipated more so recently. Needing to use enemas and dulcolax. She is having a BM perhaps once per day, hard and small bowel movements. She has had distension with this and abdominal bloating. Using enemas once per week for the past 3-4 months when she feels like she needs to evacuate more completely. No new medications. She has not been on Miralax in the past.   She is taking vitamin D - 1000 IU / day.  Vitamin D level in April was 33. She has osteopenia.  Procedural history: Modified barium swallow 03/2016 - no aspiration, mild cricopharyngeal bar EGD 06/09/15 - normal esophagus, no hiatal hernia, showed mild duodenal bulb erythema but biopsies of the 2nd portion of the duodenum and bulb showed no evidence of active celiac.  Colonoscopy 06/09/15 - 2 polyps removed on colonoscopy, the largest a 1cm sessile serrated polyp in the right colon, and another smaller adenoma. biopsies of the colon were taken and no evidence for microscopic colitis. EGD - 07/25/2011 - normal , bx negative for EoE Esophageal manometry 08/04/2010 - reportedly normal per note of Dr PSharlett IlesEGD 11/30/2009 - normal esophagus, biopsies taken to rule out EoE - path negative for reflux Colonoscopy 08/2007 -  normal EGD 08/2007 - biopsies taken to rule out EoE, hiatal hernia - path negative for EoE     Past Medical History:  Diagnosis Date  . Anxiety   . Bruises easily   . Celiac disease    Gluten free diet  . Change in voice   . Elevated cholesterol   . GERD (gastroesophageal reflux disease)   . Hiatal hernia   . Hyperlipidemia   . Insomnia   . Myalgia and myositis, unspecified   . Osteoarthrosis, unspecified whether generalized or localized, unspecified site   . Osteopenia 06/2015   T score -2.1 FRAX 9.4%/1.3% stable from prior DEXA  . Other abnormal blood chemistry   . Other symptoms involving cardiovascular system   . Reflux   . Restless leg syndrome   . STD (sexually transmitted disease)    HSV  . 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 [place] cancer [place]   . Vertigo      Past Surgical History:  Procedure Laterality Date  . ABDOMINAL HYSTERECTOMY  1986   TAH  . BRAIN SURGERY  2010   hemorrhage  . BREAST SURGERY  1999   Reduction  . COLONOSCOPY    . ESOPHAGOGASTRODUODENOSCOPY    . KNEE SURGERY  2002   both knees  . NM MYOVIEW LTD  November 2012   No ischemia or infarction  . THYROID SURGERY     Partial thyroidectomy  . TRANSTHORACIC ECHOCARDIOGRAM   April 2013; 01/18/2013  Grade 1 diastolic dysfunction; normal EF 60-65%. No significant valve disease   Family History  Problem Relation Age of Onset  . Diabetes Maternal Aunt   . Breast cancer Maternal Aunt        Maternal Great aunt-Age 38's  . Liver cancer Father   . Heart disease Maternal Grandfather   . Hyperlipidemia Maternal Grandfather   . Hypertension Paternal Grandfather   . Colon cancer Neg Hx    Social History  Substance Use Topics  . Smoking status: Never Smoker  . Smokeless tobacco: Never Used  . Alcohol use 1.2 - 1.8 oz/week    1 Standard drinks or equivalent, 1 - 2 Cans of beer per week   Current Outpatient Prescriptions  Medication Sig Dispense Refill    . APPLE CIDER VINEGAR PO Take 1 capsule by mouth 2 (two) times daily. Reported on 06/09/2015    . atorvastatin (LIPITOR) 20 MG tablet Take 1 tablet (20 mg total) by mouth every other day. 45 tablet 1  . bisacodyl (DULCOLAX) 5 MG EC tablet Take 5 mg by mouth daily as needed for moderate constipation.    [name] CALCIUM-VITAMIN D PO Take 2 tablets by mouth daily.    . clonazePAM (KLONOPIN) 1 MG tablet Take 0.5 mg by mouth at bedtime.    . CO ENZYME Q-10 PO Take by mouth daily.     . cycloSPORINE (RESTASIS) 0.05 % ophthalmic emulsion Place 1 drop into both eyes 2 (two) times daily.    . diclofenac (VOLTAREN) 75 MG EC tablet Take 1 tablet (75 mg total) by mouth 2 (two) times daily. Take 1 tab bid X 10 days then as needed 30 tablet 0  . doxycycline (VIBRA-TABS) 100 MG tablet Take 1 tablet (100 mg total) by mouth 2 (two) times daily. 20 tablet 0  . escitalopram (LEXAPRO) 20 MG tablet Take 1 tablet by mouth daily.    . magnesium citrate SOLN Take 1 Bottle by mouth as needed.     . metoprolol succinate (TOPROL-XL) 25 MG 24 hr tablet TAKE 1 TABLET BY MOUTH IN THE EVENING MAY TAKE AN ADDITIONAL DOSE WITH ONSET OF PALPITATIONS 30 tablet 6  . Multiple Vitamin (MULTIVITAMIN) tablet Take 1 tablet by mouth daily.      [name] Propylene Glycol (SYSTANE BALANCE) 0.6 % SOLN Apply 1 drop to eye 2 (two) times daily. For dry eyes, takes along with restasis    . rOPINIRole (REQUIP) 0.25 MG tablet Take 1 tablet (0.25 mg total) by mouth at bedtime. 30 tablet 1  . valACYclovir (VALTREX) 500 MG tablet TAKE 1 TABLET BY MOUTH EVERY DAY (Patient taking differently: TAKE 1 TABLET BY MOUTH PRN) 90 tablet 0   No current facility-administered medications for this visit.    Allergies  Allergen Reactions  . Ambien [Zolpidem Tartrate] Other (See Comments)    Causes Neurological problems with very bad dizziness, pains, disorientation  . Cortisone Other (See Comments)    REACTION: "mania" post ESI  . Eszopiclone And Related Other (See  Comments)    Causes Neurological problems with very bad dizziness, pains, disorientation  . Levofloxacin Other (See Comments)    Muscle spasms and jerking movements  . Dicloxacillin Other (See Comments)    Stomach problems, swelling of tongue  . Dilantin [Phenytoin Sodium Extended] Hives and Itching  . Erythromycin Other (See Comments)    Tears stomach up  . Avelox [Moxifloxacin Hcl In Nacl]     Pt unsure of reaction.  . Other  Celiac disease  . Nitrofuran Derivatives Other (See Comments)    unknown  . Phenytoin Sodium Extended Rash  . Trazodone And Nefazodone Other (See Comments)    unknown     Review of Systems: All systems reviewed and negative except where noted in HPI.   Lab Results  Component Value Date   WBC 4.9 10/11/2016   HGB 12.8 10/11/2016   HCT 38.3 10/11/2016   MCV 91.5 10/11/2016   PLT 212.0 10/11/2016    Lab Results  Component Value Date   CREATININE 0.95 10/11/2016   BUN 22 10/11/2016   NA 140 10/11/2016   K 4.6 10/11/2016   CL 104 10/11/2016   CO2 29 10/11/2016    Lab Results  Component Value Date   ALT 24 10/11/2016   AST 27 10/11/2016   ALKPHOS 70 10/11/2016   BILITOT 0.6 10/11/2016     Physical Exam: BP 122/72 (BP Location: Left Arm, Patient Position: Sitting, Cuff Size: Normal)   Pulse 66   Ht 5' 5"  (1.651 m)   Wt 161 lb 6.4 oz (73.2 kg)   SpO2 98%   BMI 26.86 kg/m  Constitutional: Pleasant,well-developed, female in no acute distress. HEENT: Normocephalic and atraumatic. Conjunctivae are normal. No scleral icterus. Neck supple.  Cardiovascular: Normal rate, regular rhythm.  Pulmonary/chest: Effort normal and breath sounds normal. No wheezing, rales or rhonchi. Abdominal: Soft, nondistended, nontender. . There are no masses palpable. No hepatomegaly. Extremities: no edema Lymphadenopathy: No cervical adenopathy noted. Neurological: Alert and oriented to person place and time. Skin: Skin is warm and dry. No rashes  noted. Psychiatric: Normal mood and affect. Behavior is normal.   ASSESSMENT AND PLAN: 66 year old female here for reassessment of the following issues:  Celiac disease - generally well-controlled, last EGD showed no active celiac in her serologies last year were negative. He is compliant with gluten-free diet. She has history of vitamin D deficiency and osteopenia, on higher dose of vitamin D and now her levels have improved to 33. We'll plan on repeating celiac serologies with her yearly blood work in November, defer to her primary care regarding timing of next DEXA scan. She is to see me once a year for this issue.  Constipation - worsening constipation in recent months, her colonoscopy is up-to-date. We discussed options for management with this. She will try MiraLAX 17 g by mouth twice a day, and titrate up as needed. If this does not work for her she can try Linzess 145 g once daily - I give her a sample this to use to see which she prefers when compared to [place]. If no improvement with the regimen she should call me for reassessment. Due for surveillance colonoscopy January 2020.  GERD - well controlled with Zantac as needed, follow-up as needed for this issue.  [place] Cellar, MD [facility] Pager [phone]

## 2016-12-29 NOTE — Patient Instructions (Signed)
We have sent the following medications to your pharmacy for you to pick up at your convenience: Miralax, take 17g twice a day  We have been given you a Low-fodmap diet to follow  We have given you samples of Linzess 128mg to use as needed.    If you are age 1532or older, your body mass index should be between 23-30. Your Body mass index is 26.86 kg/m. If this is out of the aforementioned range listed, please consider follow up with your Primary Care Provider.  If you are age 1542or younger, your body mass index should be between 19-25. Your Body mass index is 26.86 kg/m. If this is out of the aformentioned range listed, please consider follow up with your Primary Care Provider.

## 2016-12-30 LAB — BASIC METABOLIC PANEL
CREATININE: 0.1 — AB (ref 0.5–1.1)
Glucose: 103

## 2017-01-06 ENCOUNTER — Other Ambulatory Visit: Payer: Self-pay | Admitting: Cardiology

## 2017-01-16 DIAGNOSIS — M79672 Pain in left foot: Secondary | ICD-10-CM | POA: Diagnosis not present

## 2017-01-16 DIAGNOSIS — M79671 Pain in right foot: Secondary | ICD-10-CM | POA: Diagnosis not present

## 2017-01-19 ENCOUNTER — Other Ambulatory Visit: Payer: Self-pay | Admitting: Family Medicine

## 2017-01-20 NOTE — Telephone Encounter (Signed)
Please advise on refill.

## 2017-01-21 NOTE — Telephone Encounter (Signed)
Okay to fill? 

## 2017-01-23 NOTE — Telephone Encounter (Signed)
Verbally called in for patient.  

## 2017-01-30 ENCOUNTER — Other Ambulatory Visit: Payer: Self-pay

## 2017-01-30 MED ORDER — ROPINIROLE HCL 0.25 MG PO TABS
0.2500 mg | ORAL_TABLET | Freq: Every day | ORAL | 0 refills | Status: DC
Start: 2017-01-30 — End: 2017-04-18

## 2017-02-06 DIAGNOSIS — M7752 Other enthesopathy of left foot: Secondary | ICD-10-CM | POA: Diagnosis not present

## 2017-02-06 DIAGNOSIS — M7751 Other enthesopathy of right foot: Secondary | ICD-10-CM | POA: Diagnosis not present

## 2017-03-10 DIAGNOSIS — H903 Sensorineural hearing loss, bilateral: Secondary | ICD-10-CM | POA: Diagnosis not present

## 2017-03-10 DIAGNOSIS — H9113 Presbycusis, bilateral: Secondary | ICD-10-CM | POA: Diagnosis not present

## 2017-03-17 DIAGNOSIS — L821 Other seborrheic keratosis: Secondary | ICD-10-CM | POA: Diagnosis not present

## 2017-03-17 DIAGNOSIS — Z85828 Personal history of other malignant neoplasm of skin: Secondary | ICD-10-CM | POA: Diagnosis not present

## 2017-03-17 DIAGNOSIS — L570 Actinic keratosis: Secondary | ICD-10-CM | POA: Diagnosis not present

## 2017-03-20 ENCOUNTER — Ambulatory Visit: Payer: Medicare Other | Admitting: Family Medicine

## 2017-03-21 ENCOUNTER — Other Ambulatory Visit: Payer: Self-pay | Admitting: *Deleted

## 2017-03-21 ENCOUNTER — Encounter: Payer: Self-pay | Admitting: Gynecology

## 2017-03-21 ENCOUNTER — Ambulatory Visit (INDEPENDENT_AMBULATORY_CARE_PROVIDER_SITE_OTHER): Payer: Medicare Other | Admitting: Gynecology

## 2017-03-21 VITALS — BP 118/76 | Ht 66.0 in | Wt 162.0 lb

## 2017-03-21 DIAGNOSIS — Z01411 Encounter for gynecological examination (general) (routine) with abnormal findings: Secondary | ICD-10-CM

## 2017-03-21 DIAGNOSIS — Z1272 Encounter for screening for malignant neoplasm of vagina: Secondary | ICD-10-CM | POA: Diagnosis not present

## 2017-03-21 DIAGNOSIS — N952 Postmenopausal atrophic vaginitis: Secondary | ICD-10-CM

## 2017-03-21 DIAGNOSIS — M8580 Other specified disorders of bone density and structure, unspecified site: Secondary | ICD-10-CM

## 2017-03-21 MED ORDER — ATORVASTATIN CALCIUM 20 MG PO TABS: 20.0000 mg | ORAL_TABLET | ORAL | 2 refills | Status: DC

## 2017-03-21 NOTE — Patient Instructions (Signed)
Schedule your mammogram.  Follow-up in 1 year for annual exam.

## 2017-03-21 NOTE — Addendum Note (Signed)
Addended by: Nelva Nay on: 03/21/2017 03:15 PM   Modules accepted: Orders

## 2017-03-21 NOTE — Progress Notes (Signed)
    Amanda [name] [birth year] [id number]        66 y.o.  [id number] for breast and pelvic exam.  Past medical history,surgical history, problem list, medications, allergies, family history and social history were all reviewed and documented as reviewed in the EPIC chart.  ROS:  Performed with pertinent positives and negatives included in the history, assessment and plan.   Additional significant findings : None   Exam: Amanda [name] assistant Vitals:   03/21/17 1434  BP: 118/76  Weight: 162 lb (73.5 kg)  Height: 5' 6"  (1.676 m)   Body mass index is 26.15 kg/m.  General appearance:  Normal affect, orientation and appearance. Skin: Grossly normal HEENT: Without gross lesions.  No cervical or supraclavicular adenopathy. Thyroid normal.  Lungs:  Clear without wheezing, rales or rhonchi Cardiac: RR, without RMG Abdominal:  Soft, nontender, without masses, guarding, rebound, organomegaly or hernia Breasts:  Examined lying and sitting without masses, retractions, discharge or axillary adenopathy. Pelvic:  Ext, BUS, Vagina: With atrophic changes.  Pap smear of cuff done  Adnexa: Without masses or tenderness    Anus and perineum: Normal   Rectovaginal: Normal sphincter tone without palpated masses or tenderness.    Assessment/Plan:  66 y.o. [id number] female for breast and pelvic exam.   1. Postmenopausal/atrophic genital changes.  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  Doing well without significant hot flushes, night sweats or vaginal dryness. 2. Osteopenia.  DEXA 2017 T score -2.1 FRAX 9% / 1% stable from prior DEXA.  Plan repeat DEXA a 2-year interval. 3. Mammography due now and I reminded the patient to schedule.  Breast exam normal today. 4. Colonoscopy 2017.  Repeat at their recommended interval. 5. Pap smear 2015.  Pap smear done today.  No history of abnormal Pap smears.  Status post hysterectomy for benign indications.  Options to stop screening per current screening  guidelines reviewed.  Patient uncomfortable with stop screening and prefers Pap smear now. 6. Health maintenance.  No routine blood work done as patient does this elsewhere.  Follow-up in 1 year, sooner as needed.   [name], 2:55 PM 03/21/2017

## 2017-03-22 ENCOUNTER — Ambulatory Visit: Payer: Medicare Other | Admitting: Family Medicine

## 2017-03-22 ENCOUNTER — Other Ambulatory Visit: Payer: Self-pay | Admitting: Family Medicine

## 2017-03-23 NOTE — Telephone Encounter (Signed)
I am waiting for a response from Dr. [name].

## 2017-03-23 NOTE — Telephone Encounter (Signed)
MEDICATION: clonazePAM (KLONOPIN) 1 MG tablet  PHARMACY:   [id number] [place] [phone](Phone) [phone](Fax)     IS THIS A 90 DAY SUPPLY : yes  IS PATIENT OUT OF MEDICATION: yes  IF NOT; HOW MUCH IS LEFT: n/a  LAST APPOINTMENT DATE: @6 /5/18  NEXT APPOINTMENT DATE:@Visit  date not found  OTHER COMMENTS: Why does she have to have the pharmacy call each time to get it filled? Call patient to advise.    **Let patient know to contact pharmacy at the end of the day to make sure medication is ready. **  ** Please notify patient to allow 48-72 hours to process**  **Encourage patient to contact the pharmacy for refills or [facility]*

## 2017-03-23 NOTE — Telephone Encounter (Signed)
Okay refill. Make sure she has a follow up.

## 2017-03-23 NOTE — Telephone Encounter (Signed)
Please advise on refill.

## 2017-03-24 LAB — PAP IG W/ RFLX HPV ASCU

## 2017-03-24 NOTE — Telephone Encounter (Signed)
Patient would like a call back to discuss clonazepam script.

## 2017-03-24 NOTE — Telephone Encounter (Signed)
Prescription has been sent to the pharmacy and patient has been schedule.

## 2017-03-29 ENCOUNTER — Ambulatory Visit: Payer: Medicare Other | Admitting: Family Medicine

## 2017-04-04 ENCOUNTER — Encounter: Payer: Self-pay | Admitting: Family Medicine

## 2017-04-04 ENCOUNTER — Ambulatory Visit (INDEPENDENT_AMBULATORY_CARE_PROVIDER_SITE_OTHER): Payer: Medicare Other | Admitting: Family Medicine

## 2017-04-04 ENCOUNTER — Other Ambulatory Visit: Payer: Self-pay

## 2017-04-04 VITALS — BP 118/78 | HR 63 | Temp 98.6°F | Wt 161.4 lb

## 2017-04-04 DIAGNOSIS — G4700 Insomnia, unspecified: Secondary | ICD-10-CM | POA: Diagnosis not present

## 2017-04-04 DIAGNOSIS — E7800 Pure hypercholesterolemia, unspecified: Secondary | ICD-10-CM | POA: Diagnosis not present

## 2017-04-04 DIAGNOSIS — R7301 Impaired fasting glucose: Secondary | ICD-10-CM

## 2017-04-04 LAB — LIPID PANEL
Cholesterol: 200 mg/dL (ref 0–200)
HDL: 71 mg/dL (ref >39.00)
LDL Cholesterol: 117 mg/dL — ABNORMAL HIGH (ref 0–99)
NonHDL: 129.38
Total CHOL/HDL Ratio: 3
Triglycerides: 64 mg/dL (ref 0.0–149.0)
VLDL: 12.8 mg/dL (ref 0.0–40.0)

## 2017-04-04 NOTE — Patient Instructions (Signed)
Look into Neurontin and call me if you'd like to try it for sleep.

## 2017-04-04 NOTE — Progress Notes (Signed)
Amanda [name] is a 66 y.o. female is here for follow up.  History of Present Illness:   HPI:   1. HLD.   Is the patient taking medications without problems? [x]   YES  []   NO Does the patient complain of muscle aches?   []   YES  [x]    NO Trying to exercise on a regular basis? []   YES  [x]   NO Diet Compliance: compliant most of the time. Concerns: none. Cardiovascular ROS: no chest pain or dyspnea on exertion.   Lipids:    Component Value Date/Time   CHOL 200 04/04/2017 1228   CHOL 315 (H) 05/15/2015 1136   TRIG 64.0 04/04/2017 1228   TRIG 92 05/15/2015 1136   TRIG 70 04/12/2006 1129   HDL 71.00 04/04/2017 1228   HDL 84 05/15/2015 1136   LDLDIRECT 139.2 03/10/2008 1036   VLDL 12.8 04/04/2017 1228   CHOLHDL 3 04/04/2017 1228    2. Prediabetes.  Lab Results  Component Value Date   HGBA1C 5.7 (H) 01/18/2013   Wt Readings from Last 3 Encounters:  04/04/17 161 lb 6.4 oz (73.2 kg)  03/21/17 162 lb (73.5 kg)  12/29/16 161 lb 6.4 oz (73.2 kg)    3. Insomnia. Difficulty sleeping - wakes in the early morning.  Taking Clonazepam currently. Interested in weaning this medication. Good sleep hygiene. No daytime naps. No snoring.    There are no preventive care reminders to display for this patient. Depression screen [facility] 2/9 09/26/2016 02/19/2016  Decreased Interest 0 0  Down, Depressed, Hopeless 0 0  PHQ - 2 Score 0 0  Altered sleeping 0 -  Tired, decreased energy 0 -  Change in appetite 0 -  Feeling bad or failure about yourself  0 -  Trouble concentrating 0 -  Moving slowly or fidgety/restless 0 -  Suicidal thoughts 0 -  PHQ-9 Score 0 -  Difficult doing work/chores Not difficult at all -   PMHx, SurgHx, SocialHx, FamHx, Medications, and Allergies were reviewed in the Visit Navigator and updated as appropriate.   Patient Active Problem List   Diagnosis Date Noted  . Dysthymia 09/26/2016  . PVC (premature ventricular contraction) 08/27/2016  . Anxiety 08/03/2016    . Laryngopharyngeal reflux (LPR) 07/26/2016  . Thyroid cancer ([place])   . GERD (gastroesophageal reflux disease)   . Myalgia and myositis, unspecified   . Restless leg syndrome   . Allergic rhinitis 06/25/2009  . Diaphragmatic hernia 02/16/2009  . IBS (irritable bowel syndrome) 02/16/2009  . Hyperlipidemia with target LDL less than 130 09/26/2008  . Celiac disease 11/27/2007  . Osteoarthritis 07/05/2007  . Osteopenia 10/03/2006  . Left carotid bruit 10/03/2006  . History of thyroid cancer 10/03/2006   Social History   Tobacco Use  . Smoking status: Never Smoker  . Smokeless tobacco: Never Used  Substance Use Topics  . Alcohol use: Yes    Alcohol/week: 1.2 - 1.8 oz    Types: 1 - 2 Cans of beer, 1 Standard drinks or equivalent per week  . Drug use: No   Current Medications and Allergies:   .  APPLE CIDER VINEGAR PO, Take 1 capsule by mouth 2 (two) times daily. Reported on 06/09/2015, Disp: , Rfl:  .  atorvastatin (LIPITOR) 20 MG tablet, Take 1 tablet (20 mg total) every other day by mouth., Disp: 45 tablet, Rfl: 2 .  bisacodyl (DULCOLAX) 5 MG EC tablet, Take 5 mg by mouth daily as needed for moderate constipation., Disp: ,  Rfl:  .  CALCIUM-VITAMIN D PO, Take 2 tablets by mouth daily., Disp: , Rfl:  .  clonazePAM (KLONOPIN) 1 MG tablet, TAKE 1/2 TO 1 TABLET BY MOUTH AT BEDTIME AS NEEDED FOR SLEEP, Disp: 30 tablet, Rfl: 0 .  CO ENZYME Q-10 PO, Take by mouth daily. , Disp: , Rfl:  .  cycloSPORINE (RESTASIS) 0.05 % ophthalmic emulsion, Place 1 drop into both eyes 2 (two) times daily., Disp: , Rfl:  .  diclofenac (VOLTAREN) 75 MG EC tablet, Take 1 tablet (75 mg total) by mouth 2 (two) times daily. Take 1 tab bid X 10 days then as needed (Patient not taking: Reported on 03/21/2017), Disp: 30 tablet, Rfl: 0 .  doxycycline (VIBRA-TABS) 100 MG tablet, Take 1 tablet (100 mg total) by mouth 2 (two) times daily. (Patient not taking: Reported on 03/21/2017), Disp: 20 tablet, Rfl: 0 .   escitalopram (LEXAPRO) 20 MG tablet, Take 1 tablet by mouth daily., Disp: , Rfl:  .  linaclotide (LINZESS) 145 MCG CAPS capsule, Samples given to patient    Lot# [id number]       Exp. 9-19                 Amount 3 boxes (Patient not taking: Reported on 03/21/2017), Disp: 12 capsule, Rfl: 0 .  magnesium citrate SOLN, Take 1 Bottle by mouth as needed. , Disp: , Rfl:  .  metoprolol succinate (TOPROL-XL) 25 MG 24 hr tablet, TAKE 1 TABLET BY MOUTH IN THE EVENING MAY TAKE AN ADDITIONAL DOSE WITH ONSET OF PALPITATIONS, Disp: 60 tablet, Rfl: 7 .  Multiple Vitamin (MULTIVITAMIN) tablet, Take 1 tablet by mouth daily.  , Disp: , Rfl:  .  polyethylene glycol powder (GLYCOLAX/MIRALAX) powder, Take 17g twice a day, Disp: 500 g, Rfl: 3 .  Propylene Glycol (SYSTANE BALANCE) 0.6 % SOLN, Apply 1 drop to eye 2 (two) times daily. For dry eyes, takes along with restasis, Disp: , Rfl:  .  rOPINIRole (REQUIP) 0.25 MG tablet, Take 1 tablet (0.25 mg total) by mouth at bedtime., Disp: 90 tablet, Rfl: 0 .  valACYclovir (VALTREX) 500 MG tablet, TAKE 1 TABLET BY MOUTH EVERY DAY (Patient taking differently: TAKE 1 TABLET BY MOUTH PRN), Disp: 90 tablet, Rfl: 0   Allergies  Allergen Reactions  . Ambien [Zolpidem Tartrate] Other (See Comments)    Causes Neurological problems with very bad dizziness, pains, disorientation  . Cortisone Other (See Comments)    REACTION: "mania" post ESI  . Eszopiclone And Related Other (See Comments)    Causes Neurological problems with very bad dizziness, pains, disorientation  . Levofloxacin Other (See Comments)    Muscle spasms and jerking movements  . Dicloxacillin Other (See Comments)    Stomach problems, swelling of tongue  . Dilantin [Phenytoin Sodium Extended] Hives and Itching  . Erythromycin Other (See Comments)    Tears stomach up  . Avelox [Moxifloxacin Hcl In Nacl]     Pt unsure of reaction.  . Other     Celiac disease  . Nitrofuran Derivatives Other (See Comments)    unknown    . Phenytoin Sodium Extended Rash  . Trazodone And Nefazodone Other (See Comments)    unknown   Review of Systems   Pertinent items are noted in the HPI. Otherwise, ROS is negative.  Vitals:   Vitals:   04/04/17 1134  BP: 118/78  Pulse: 63  Temp: 98.6 F (37 C)  TempSrc: Oral  SpO2: 95%  Weight: 161 lb 6.4 oz (  73.2 kg)     Body mass index is 26.05 kg/m.   Physical Exam:   Physical Exam  Constitutional: She appears well-nourished.  HENT:  Head: Normocephalic and atraumatic.  Eyes: EOM are normal. Pupils are equal, round, and reactive to light.  Neck: Normal range of motion. Neck supple.  Cardiovascular: Normal rate, regular rhythm, normal heart sounds and intact distal pulses.  Pulmonary/Chest: Effort normal.  Abdominal: Soft.  Skin: Skin is warm.  Psychiatric: She has a normal mood and affect. Her behavior is normal.  Nursing note and vitals reviewed.  Results for orders placed or performed in visit on 04/04/17  Lipid panel  Result Value Ref Range   Cholesterol 200 0 - 200 mg/dL   Triglycerides 64.0 0.0 - 149.0 mg/dL   HDL 71.00 >39.00 mg/dL   VLDL 12.8 0.0 - 40.0 mg/dL   LDL Cholesterol 117 (H) 0 - 99 mg/dL   Total CHOL/HDL Ratio 3    NonHDL 129.38    Assessment and Plan:   Margorie was seen today for follow-up.  Diagnoses and all orders for this visit:  Pure hypercholesterolemia Comments: Well controlled.  No signs of complications, medication side effects, or red flags.  Continue current regimen.   Orders: -     Lipid panel  Fasting hyperglycemia Comments:  Lab Results  Component Value Date   HGBA1C 5.7 (H) 01/18/2013   The patient is asked to make an attempt to improve diet and exercise patterns to aid in medical management of this problem.   Insomnia, unspecified type Comments: We reviewed several options for sleep. Will continue the current treatment for now. She will read about Neurontin prior to the next visit.   . Reviewed  expectations re: course of current medical issues. . Discussed self-management of symptoms. . Outlined signs and symptoms indicating need for more acute intervention. . Patient verbalized understanding and all questions were answered. [name] Health Maintenance issues including appropriate healthy diet, exercise, and smoking avoidance were discussed with patient. . See orders for this visit as documented in the electronic medical record. . Patient received an After Visit Summary.  Briscoe Deutscher, DO [place] [place] 04/05/2017  Future Appointments  Date Time Provider [place]  09/27/2017  9:00 AM Williemae Area, RN [facility]

## 2017-04-05 ENCOUNTER — Telehealth: Payer: Self-pay | Admitting: Family Medicine

## 2017-04-05 ENCOUNTER — Encounter: Payer: Self-pay | Admitting: Family Medicine

## 2017-04-05 DIAGNOSIS — G479 Sleep disorder, unspecified: Secondary | ICD-10-CM

## 2017-04-05 DIAGNOSIS — G4700 Insomnia, unspecified: Secondary | ICD-10-CM

## 2017-04-05 NOTE — Telephone Encounter (Signed)
Yes, okay.

## 2017-04-05 NOTE — Telephone Encounter (Signed)
Copied from [place] [phone]. Topic: Referral - Request >> Apr 04, 2017  3:23 PM Boyd Kerbs wrote: Reason for CRM: wants to get referral for sleep study

## 2017-04-05 NOTE — Telephone Encounter (Signed)
Is it Ok to place referral for sleep study?

## 2017-04-06 ENCOUNTER — Telehealth: Payer: Self-pay

## 2017-04-06 DIAGNOSIS — G479 Sleep disorder, unspecified: Secondary | ICD-10-CM

## 2017-04-06 DIAGNOSIS — H25013 Cortical age-related cataract, bilateral: Secondary | ICD-10-CM | POA: Diagnosis not present

## 2017-04-06 DIAGNOSIS — H2513 Age-related nuclear cataract, bilateral: Secondary | ICD-10-CM | POA: Diagnosis not present

## 2017-04-06 NOTE — Telephone Encounter (Signed)
Called patient l/m to let her know referral has been started.

## 2017-04-06 NOTE — Addendum Note (Signed)
Addended by: Francella Solian on: 04/06/2017 08:54 AM   Modules accepted: Orders

## 2017-04-06 NOTE — Telephone Encounter (Signed)
Sure. Let's do it. Home sleep study for daytime fatigue and sleep disorder breathing.

## 2017-04-06 NOTE — Telephone Encounter (Signed)
Called patient states that at last office visit you had given a script for sleep aid due to inability to sleep at night. She would like to know if you feel like a sleep study would be warranted before she starts medication. She complains of fatigue. No history of snoring. If ok she would like the home sleep study.   Copied from [place] [phone]. Topic: Referral - Request >> Apr 04, 2017  3:23 PM Boyd Kerbs wrote: Reason for CRM: wants to get referral for sleep study

## 2017-04-18 ENCOUNTER — Other Ambulatory Visit: Payer: Self-pay | Admitting: Family Medicine

## 2017-04-19 NOTE — Telephone Encounter (Signed)
Please advise on refill.

## 2017-05-09 HISTORY — PX: CATARACT EXTRACTION W/ INTRAOCULAR LENS  IMPLANT, BILATERAL: SHX1307

## 2017-05-10 DIAGNOSIS — H2513 Age-related nuclear cataract, bilateral: Secondary | ICD-10-CM | POA: Diagnosis not present

## 2017-05-10 DIAGNOSIS — H25013 Cortical age-related cataract, bilateral: Secondary | ICD-10-CM | POA: Diagnosis not present

## 2017-05-26 ENCOUNTER — Other Ambulatory Visit: Payer: Self-pay | Admitting: Family Medicine

## 2017-05-26 NOTE — Telephone Encounter (Signed)
Ok to refill 

## 2017-05-26 NOTE — Telephone Encounter (Signed)
Error. Please see note below

## 2017-05-26 NOTE — Telephone Encounter (Signed)
See note

## 2017-05-26 NOTE — Telephone Encounter (Signed)
Pt calling back to check on rx to be sent to pharmacy

## 2017-05-26 NOTE — Telephone Encounter (Signed)
Copied from [place] [phone]. Topic: Quick Communication - Rx Refill/Question >> May 26, 2017  1:19 PM Lolita Rieger, [place] wrote: Medication: clonazepam   Has the patient contacted their pharmacy? yes   (Agent: If no, request that the patient contact the pharmacy for the refill.)   Preferred Pharmacy (with phone number or street name[facility] [place]   Agent: Please be advised that RX refills may take up to 3 business days. We ask that you follow-up with your pharmacy.

## 2017-05-27 NOTE — Telephone Encounter (Signed)
Completed. E-scribed.

## 2017-05-29 ENCOUNTER — Telehealth: Payer: Self-pay | Admitting: *Deleted

## 2017-05-29 NOTE — Telephone Encounter (Signed)
Left message on patient voicemail that she would have to be seen for a prescription of cough medicine.

## 2017-05-29 NOTE — Telephone Encounter (Signed)
Per [facility] fax: Caller states is coughing and is wanting to know if cough medicine with codeine could be called it?

## 2017-06-23 ENCOUNTER — Other Ambulatory Visit: Payer: Self-pay | Admitting: Family Medicine

## 2017-06-23 NOTE — Telephone Encounter (Signed)
MEDICATION:   [facility]  IS THIS A 90 DAY SUPPLY : 30  IS PATIENT OUT OF MEDICATION: no  IF NOT; HOW MUCH IS LEFT:   LAST APPOINTMENT DATE: @1 /21/2019  NEXT APPOINTMENT DATE:@5 /22/2019  OTHER COMMENTS:    **Let patient know to contact pharmacy at the end of the day to make sure medication is ready. **  ** Please notify patient to allow 48-72 hours to process**  **Encourage patient to contact the pharmacy for refills or they can request refills through [facility]**

## 2017-06-28 DIAGNOSIS — H25012 Cortical age-related cataract, left eye: Secondary | ICD-10-CM | POA: Diagnosis not present

## 2017-06-28 DIAGNOSIS — H2512 Age-related nuclear cataract, left eye: Secondary | ICD-10-CM | POA: Diagnosis not present

## 2017-06-28 DIAGNOSIS — H2511 Age-related nuclear cataract, right eye: Secondary | ICD-10-CM | POA: Diagnosis not present

## 2017-06-28 DIAGNOSIS — H25011 Cortical age-related cataract, right eye: Secondary | ICD-10-CM | POA: Diagnosis not present

## 2017-07-05 DIAGNOSIS — H2512 Age-related nuclear cataract, left eye: Secondary | ICD-10-CM | POA: Diagnosis not present

## 2017-07-05 DIAGNOSIS — H25012 Cortical age-related cataract, left eye: Secondary | ICD-10-CM | POA: Diagnosis not present

## 2017-07-14 ENCOUNTER — Other Ambulatory Visit: Payer: Self-pay

## 2017-07-14 ENCOUNTER — Ambulatory Visit: Payer: Self-pay | Admitting: *Deleted

## 2017-07-14 ENCOUNTER — Ambulatory Visit: Payer: Medicare Other | Admitting: Family Medicine

## 2017-07-14 ENCOUNTER — Emergency Department (HOSPITAL_BASED_OUTPATIENT_CLINIC_OR_DEPARTMENT_OTHER)
Admission: EM | Admit: 2017-07-14 | Discharge: 2017-07-14 | Disposition: A | Discharge status: Home / Self Care | Payer: Medicare Other | Attending: Emergency Medicine | Admitting: Emergency Medicine

## 2017-07-14 ENCOUNTER — Encounter (HOSPITAL_BASED_OUTPATIENT_CLINIC_OR_DEPARTMENT_OTHER): Payer: Self-pay | Admitting: *Deleted

## 2017-07-14 DIAGNOSIS — G4489 Other headache syndrome: Secondary | ICD-10-CM | POA: Diagnosis not present

## 2017-07-14 DIAGNOSIS — R51 Headache: Secondary | ICD-10-CM | POA: Diagnosis present

## 2017-07-14 DIAGNOSIS — Z79899 Other long term (current) drug therapy: Secondary | ICD-10-CM | POA: Insufficient documentation

## 2017-07-14 NOTE — Telephone Encounter (Signed)
FYI

## 2017-07-14 NOTE — ED Provider Notes (Signed)
[facility] EMERGENCY DEPARTMENT Provider Note  CSN: [id number] Arrival date & time: 07/14/17 1315  Chief Complaint(s) Headache  HPI Amanda [name] is a 67 y.o. female with h/o SDH following mechanical fall and recent catarac surgery here for  1 week of intermittent, brief, sporadic, sharp/stabbing headaches lasting only seconds and self resolving. No alleviating or aggravating factors noted. No recent trauma, falls, fever, infections. No associated focal weakness, change in vision, vertigo, or light headedness. Seen by her ophthalmologist who perfomed IOPs and detailed occular exam and did not feel that it was related to her recent surgery. She currently denied any symptoms at this time.  The history is provided by the patient.  Headache      Past Medical History Past Medical History:  Diagnosis Date  . Anxiety   . Bruises easily   . Celiac disease    Gluten free diet  . Change in voice   . Elevated cholesterol   . GERD (gastroesophageal reflux disease)   . Hiatal hernia   . Hyperlipidemia   . Insomnia   . Myalgia and myositis, unspecified   . Osteoarthrosis, unspecified whether generalized or localized, unspecified site   . Osteopenia 06/2015   T score -2.1 FRAX 9.4%/1.3% stable from prior DEXA  . Other abnormal blood chemistry   . Other symptoms involving cardiovascular system   . Reflux   . Restless leg syndrome   . STD (sexually transmitted disease)    HSV  . Subdural hematoma ([place]) 2010   s/p left frontoparietal craniotomy  . Thyroid cancer ([place])   . Vertigo    Patient Active Problem List   Diagnosis Date Noted  . Dysthymia 09/26/2016  . PVC (premature ventricular contraction) 08/27/2016  . Anxiety 08/03/2016  . Laryngopharyngeal reflux (LPR) 07/26/2016  . Thyroid cancer ([place])   . GERD (gastroesophageal reflux disease)   . Myalgia and myositis, unspecified   . Restless leg syndrome   . Allergic rhinitis 06/25/2009  . Diaphragmatic hernia  02/16/2009  . IBS (irritable bowel syndrome) 02/16/2009  . Hyperlipidemia with target LDL less than 130 09/26/2008  . Celiac disease 11/27/2007  . Osteoarthritis 07/05/2007  . Osteopenia 10/03/2006  . Left carotid bruit 10/03/2006  . History of thyroid cancer 10/03/2006   Home Medication(s) Prior to Admission medications   Medication Sig Start Date End Date Taking? Authorizing Provider  APPLE CIDER VINEGAR PO Take 1 capsule by mouth 2 (two) times daily. Reported on 06/09/2015    [provider]  atorvastatin (LIPITOR) 20 MG tablet Take 1 tablet (20 mg total) every other day by mouth. 03/21/17   Leonie Man, MD  bisacodyl (DULCOLAX) 5 MG EC tablet Take 5 mg by mouth daily as needed for moderate constipation.    [provider]  CALCIUM-VITAMIN D PO Take 2 tablets by mouth daily.    [provider]  clonazePAM (KLONOPIN) 1 MG tablet TAKE 1/2 TO 1 TABLET BY MOUTH AT BEDTIME AS NEEDED FOR SLEEP 06/25/17   Briscoe Deutscher, DO  CO ENZYME Q-10 PO Take by mouth daily.     [provider]  cycloSPORINE (RESTASIS) 0.05 % ophthalmic emulsion Place 1 drop into both eyes 2 (two) times daily.    [provider]  diclofenac (VOLTAREN) 75 MG EC tablet Take 1 tablet (75 mg total) by mouth 2 (two) times daily. Take 1 tab bid X 10 days then as needed Patient not taking: Reported on 03/21/2017 09/20/16   Gerda Diss, DO  doxycycline (VIBRA-TABS) 100 MG tablet Take 1 tablet (100 mg total) by mouth 2 (two) times daily. Patient not taking: Reported on 03/21/2017 10/11/16   Inda Coke, PA  escitalopram (LEXAPRO) 20 MG tablet Take 1 tablet by mouth daily. 03/11/16   [provider]  linaclotide Rolan Lipa) 145 MCG CAPS capsule Samples given to patient    Lot# [id number]       Exp. 9-19                 Amount 3 boxes Patient not taking: Reported on 03/21/2017 12/29/16   Yetta Flock, MD  magnesium citrate SOLN Take 1 Bottle by mouth as needed.      [provider]  metoprolol succinate (TOPROL-XL) 25 MG 24 hr tablet TAKE 1 TABLET BY MOUTH IN THE EVENING MAY TAKE AN ADDITIONAL DOSE WITH ONSET OF PALPITATIONS 01/06/17   Leonie Man, MD  Multiple Vitamin (MULTIVITAMIN) tablet Take 1 tablet by mouth daily.      [provider]  polyethylene glycol (MIRALAX / GLYCOLAX) packet Take 17 g by mouth daily.    [provider]  polyethylene glycol powder (GLYCOLAX/MIRALAX) powder Take 17g twice a day 12/29/16   Armbruster, Carlota Raspberry, MD  Propylene Glycol (SYSTANE BALANCE) 0.6 % SOLN Apply 1 drop to eye 2 (two) times daily. For dry eyes, takes along with restasis    [provider]  rOPINIRole (REQUIP) 0.25 MG tablet TAKE 1 TABLET BY MOUTH AT BEDTIME 04/19/17   Briscoe Deutscher, DO  valACYclovir (VALTREX) 500 MG tablet TAKE 1 TABLET BY MOUTH EVERY DAY Patient taking differently: TAKE 1 TABLET BY MOUTH PRN 12/08/15   Fontaine, Belinda Block, MD  ezetimibe (ZETIA) 10 MG tablet Take 1 tablet (10 mg total) by mouth daily. 04/21/14 01/21/15  Leonie Man, MD                                                                                                                                    Past Surgical History Past Surgical History:  Procedure Laterality Date  . ABDOMINAL HYSTERECTOMY  1986   TAH  . BRAIN SURGERY  2010   hemorrhage  . BREAST SURGERY  1999   Reduction  . COLONOSCOPY    . ESOPHAGOGASTRODUODENOSCOPY    . KNEE SURGERY  2002   both knees  . NM MYOVIEW LTD  November 2012   No ischemia or infarction  . THYROID SURGERY     Partial thyroidectomy  . TRANSTHORACIC ECHOCARDIOGRAM   April 2013; 01/18/2013   Grade 1 diastolic dysfunction; normal EF 60-65%. No significant valve disease   Family History Family History  Problem Relation Age of Onset  . Diabetes Maternal Aunt   . Breast cancer Maternal Aunt        Maternal Great aunt-Age 62's  . Liver cancer Father   . Heart disease Maternal Grandfather   .  Hyperlipidemia Maternal Grandfather   .  Hypertension Paternal Grandfather   . Colon cancer Neg Hx     Social History Social History   Tobacco Use  . Smoking status: Never Smoker  . Smokeless tobacco: Never Used  Substance Use Topics  . Alcohol use: Yes    Alcohol/week: 1.2 - 1.8 oz    Types: 1 - 2 Cans of beer, 1 Standard drinks or equivalent per week  . Drug use: No   Allergies Ambien [zolpidem tartrate]; Cortisone; Eszopiclone and related; Levofloxacin; Dicloxacillin; Dilantin [phenytoin sodium extended]; Erythromycin; Avelox [moxifloxacin hcl in nacl]; Other; Nitrofuran derivatives; Phenytoin sodium extended; and Trazodone and nefazodone  Review of Systems Review of Systems  Neurological: Positive for headaches.   All other systems are reviewed and are negative for acute change except as noted in the HPI  Physical Exam Vital Signs  I have reviewed the triage vital signs BP 135/75   Pulse 71   Temp 98.3 F (36.8 C) (Oral)   Resp 20   Ht 5' 6"  (1.676 m)   Wt 71.7 kg (158 lb)   SpO2 98%   BMI 25.50 kg/m   Physical Exam  Constitutional: She is oriented to person, place, and time. She appears well-developed and well-nourished. No distress.  HENT:  Head: Normocephalic and atraumatic.  Right Ear: External ear normal.  Left Ear: External ear normal.  Nose: Nose normal.  Eyes: Conjunctivae and EOM are normal. No scleral icterus.  Neck: Normal range of motion and phonation normal.  Cardiovascular: Normal rate and regular rhythm.  Pulmonary/Chest: Effort normal. No stridor. No respiratory distress.  Abdominal: She exhibits no distension.  Musculoskeletal: Normal range of motion. She exhibits no edema.  Neurological: She is alert and oriented to person, place, and time.  Mental Status:  Alert and oriented to person, place, and time.  Attention and concentration normal.  Speech clear.  Recent memory is intact  Cranial Nerves:  II Visual Fields: Intact to  confrontation. Visual fields intact. III, IV, VI: Pupils equal and reactive to light and near. Full eye movement without nystagmus  V Facial Sensation: Normal. No weakness of masticatory muscles  VII: No facial weakness or asymmetry  VIII Auditory Acuity: Grossly normal  IX/X: The uvula is midline; the palate elevates symmetrically  XI: Normal sternocleidomastoid and trapezius strength  XII: The tongue is midline. No atrophy or fasciculations.   Motor System: Muscle Strength: 5/5 and symmetric in the upper and lower extremities. No pronation or drift.  Muscle Tone: Tone and muscle bulk are normal in the upper and lower extremities.   Reflexes: DTRs: 1+ and symmetrical in all four extremities. No Clonus Coordination: Intact finger-to-nose, heel-to-shin. No tremor.  Sensation: Intact to light touch, and pinprick. Gait: Routine gait normal.   Skin: She is not diaphoretic.  Psychiatric: She has a normal mood and affect. Her behavior is normal.  Vitals reviewed.   ED Results and Treatments Labs (all labs ordered are listed, but only abnormal results are displayed) Labs Reviewed - No data to display  EKG  EKG Interpretation  Date/Time:    Ventricular Rate:    PR Interval:    QRS Duration:   QT Interval:    QTC Calculation:   R Axis:     Text Interpretation:        Radiology No results found. Pertinent labs & imaging results that were available during my care of the patient were reviewed by me and considered in my medical decision making (see chart for details).  Medications Ordered in ED Medications - No data to display                                                                                                                                  Procedures Procedures  (including critical care time)  Medical Decision Making / ED Course I have reviewed the  nursing notes for this encounter and the patient's prior records (if available in EHR or on provided paperwork).    Non focal neuro exam. No recent head trauma. No fever. Doubt meningitis. Doubt intracranial bleed. Doubt IIH. No indication for imaging. This was discussed with patient who with shared decision making was amiable to the plan of no imaging.   Etiology uncertain at this time but may be related to muscle spasms vs neuralgia.   The patient is safe for discharge with strict return precautions.     Final Clinical Impression(s) / ED Diagnoses Final diagnoses:  Other headache syndrome   Disposition: Discharge  Condition: Good  I have discussed the results, Dx and Tx plan with the patient who expressed understanding and agree(s) with the plan. Discharge instructions discussed at great length. The patient was given strict return precautions who verbalized understanding of the instructions. No further questions at time of discharge.    ED Discharge Orders    None       Follow Up: Briscoe Deutscher, DO [place] [id number] [phone]  Schedule an appointment as soon as possible for a visit  As needed     This chart was dictated using voice recognition software.  Despite best efforts to proofread,  errors can occur which can change the documentation meaning.   Fatima Blank, MD 07/14/17 [phone]

## 2017-07-14 NOTE — ED Triage Notes (Addendum)
Headache for a few days. States the pain is stabbing and intermittent. Hx of subdural in 2010. No pain at present. Cataract surgery 2 weeks ago.

## 2017-07-14 NOTE — Telephone Encounter (Signed)
Patient is calling to report she is having L sided head pain. She reports she had cataract surgery and did make an appointment with them for the pain- but when it became sharp and more on the side - it was very reminiscent of the 2010 subdural hematoma she had to have surgery for. Patient states she is going to the hospital because it seems very familiar to her and it has made her nervous. Patient states she is going to [facility] ED/[facility].

## 2017-07-14 NOTE — Telephone Encounter (Signed)
See note

## 2017-07-19 ENCOUNTER — Telehealth: Payer: Self-pay | Admitting: Family Medicine

## 2017-07-19 DIAGNOSIS — R5383 Other fatigue: Secondary | ICD-10-CM

## 2017-07-19 DIAGNOSIS — E559 Vitamin D deficiency, unspecified: Secondary | ICD-10-CM

## 2017-07-19 DIAGNOSIS — Z13220 Encounter for screening for lipoid disorders: Secondary | ICD-10-CM

## 2017-07-19 NOTE — Telephone Encounter (Signed)
See note

## 2017-07-19 NOTE — Telephone Encounter (Signed)
Copied from [place] [id number] [phone]. Topic: General - Other >> Jul 19, 2017  1:39 PM Cecelia Byars, NT wrote: Reason for CRM: Patient would like an order placed to have labs done before her physical scheduled on 10/13/17 please call her at [phone]

## 2017-07-20 NOTE — Telephone Encounter (Signed)
Do you want to do labs before appointment?

## 2017-07-23 ENCOUNTER — Other Ambulatory Visit: Payer: Self-pay | Admitting: Family Medicine

## 2017-07-23 NOTE — Telephone Encounter (Signed)
CBC, CMP, FLP, vit D

## 2017-07-24 NOTE — Telephone Encounter (Signed)
Left message for patient to call the office to schedule lab appointment before physical. Labs have been ordered.

## 2017-08-02 ENCOUNTER — Other Ambulatory Visit: Payer: Self-pay | Admitting: Cardiology

## 2017-08-07 ENCOUNTER — Encounter: Payer: Self-pay | Admitting: Gastroenterology

## 2017-08-14 ENCOUNTER — Other Ambulatory Visit: Payer: Self-pay | Admitting: Family Medicine

## 2017-08-28 ENCOUNTER — Other Ambulatory Visit: Payer: Self-pay

## 2017-08-28 DIAGNOSIS — Z1231 Encounter for screening mammogram for malignant neoplasm of breast: Secondary | ICD-10-CM

## 2017-08-29 ENCOUNTER — Telehealth: Payer: Self-pay | Admitting: *Deleted

## 2017-08-29 ENCOUNTER — Ambulatory Visit (INDEPENDENT_AMBULATORY_CARE_PROVIDER_SITE_OTHER): Payer: Medicare Other | Admitting: Gynecology

## 2017-08-29 ENCOUNTER — Encounter: Payer: Self-pay | Admitting: Gynecology

## 2017-08-29 VITALS — BP 118/76

## 2017-08-29 DIAGNOSIS — N898 Other specified noninflammatory disorders of vagina: Secondary | ICD-10-CM

## 2017-08-29 DIAGNOSIS — N63 Unspecified lump in unspecified breast: Secondary | ICD-10-CM

## 2017-08-29 DIAGNOSIS — R8279 Other abnormal findings on microbiological examination of urine: Secondary | ICD-10-CM | POA: Diagnosis not present

## 2017-08-29 DIAGNOSIS — N644 Mastodynia: Secondary | ICD-10-CM

## 2017-08-29 LAB — WET PREP FOR TRICH, YEAST, CLUE

## 2017-08-29 NOTE — Addendum Note (Signed)
Addended by: Nelva Nay on: 08/29/2017 03:22 PM   Modules accepted: Orders

## 2017-08-29 NOTE — Patient Instructions (Signed)
The breast center will call to arrange for the mammography and ultrasound.  Call my office if we do not hear from them within a week or so.

## 2017-08-29 NOTE — Telephone Encounter (Signed)
Patient scheduled on 09/01/17 @ 1:20pm pt informed.Amanda [name]

## 2017-08-29 NOTE — Telephone Encounter (Signed)
-----   Message from Anastasio Auerbach, MD sent at 08/29/2017  3:09 PM EDT ----- Arrange for bilateral diagnostic mammography and bilateral ultrasounds reference new onset bilateral breast tenderness.  Attention to right breast 3 o'clock position and left breast tail of Spence region.  Patient reports nodularity at 3:00 right breast.  Physician exam is negative

## 2017-08-29 NOTE — Progress Notes (Signed)
    Amanda [name] [birth year] [id number]        67 y.o.  G2P2002 presents with 2 separate issues:  1. Bilateral breast tenderness.  Patient notes over the last month or so left breast tenderness primarily in the tail of Spence region which comes and goes.  Also very itchy nipple on that side although again this seems to have resolved.  No palpable abnormalities on self breast exam.  On her right breast she has a tender area at the 3 o'clock position where she thinks she feels some nodularity.  Last mammogram 2017.  No nipple discharge. 2. Vaginal irritation over the last several weeks.  No discharge or odor.  No UTI symptoms to include excessive frequency, urgency dysuria low back pain fever or chills.  Past medical history,surgical history, problem list, medications, allergies, family history and social history were all reviewed and documented in the EPIC chart.  Directed ROS with pertinent positives and negatives documented in the history of present illness/assessment and plan.  Exam: [name] assistant Vitals:   08/29/17 1435  BP: 118/76   General appearance:  Normal Both breasts examined lying is sitting without masses retractions discharge adenopathy. Pelvic external BUS vagina with atrophic changes.  Scant white discharge noted.  Bimanual exam without masses or tenderness.  Assessment/Plan:  67 y.o. [id number] with:  1. Bilateral breast tenderness with reported nodularity right breast 3 o'clock position although physician exam is normal.  Recommend diagnostic mammography and ultrasound at the 3 o'clock position right breast and tail of Spence left breast as these are the 2 areas of concern to her.  We discussed the issues of mastalgia and the various etiologies.  If both studies are negative at this point we will plan expectant management.  Assuming her symptoms resolve over time then will monitor.  If they would persist or certainly worsen or more definitive palpable abnormality  she will represent for evaluation. 2. Vaginal irritation.  Exam shows atrophic changes.  Prep is negative.  I reviewed the issues of vaginal atrophy and options for management to include OTC product such as vaginal moisturizers up to and including vaginal estrogen replacement.  I discussed the issues of vaginal estrogen to include absorption with systemic effects such as thrombosis and breast stimulation.  She is status post hysterectomy in the past.  At this point the patient wants to try OTC products.  She will follow-up if it continues to be an issue and she wants to rediscuss vaginal estrogen.    [name], 2:55 PM 08/29/2017

## 2017-08-30 LAB — URINALYSIS, COMPLETE W/RFL CULTURE
BILIRUBIN URINE: NEGATIVE
Bacteria, UA: NONE SEEN /HPF
GLUCOSE, UA: NEGATIVE
HYALINE CAST: NONE SEEN /LPF
Hgb urine dipstick: NEGATIVE
Ketones, ur: NEGATIVE
Leukocyte Esterase: NEGATIVE
NITRITES URINE, INITIAL: NEGATIVE
PROTEIN: NEGATIVE
RBC / HPF: NONE SEEN /HPF (ref 0–2)
SPECIFIC GRAVITY, URINE: 1.007 (ref 1.001–1.03)
Squamous Epithelial / LPF: NONE SEEN /HPF (ref <=5)
WBC, UA: NONE SEEN /HPF (ref 0–5)
pH: 7.5 (ref 5.0–8.0)

## 2017-08-30 LAB — URINE CULTURE
MICRO NUMBER:: 90495347
SPECIMEN QUALITY: ADEQUATE

## 2017-08-30 LAB — NO CULTURE INDICATED

## 2017-09-01 ENCOUNTER — Ambulatory Visit
Admission: RE | Admit: 2017-09-01 | Discharge: 2017-09-01 | Disposition: A | Discharge status: Home / Self Care | Payer: Medicare Other | Source: Ambulatory Visit | Attending: Gynecology | Admitting: Gynecology

## 2017-09-01 DIAGNOSIS — N63 Unspecified lump in unspecified breast: Secondary | ICD-10-CM

## 2017-09-01 DIAGNOSIS — H1859 Other hereditary corneal dystrophies: Secondary | ICD-10-CM | POA: Diagnosis not present

## 2017-09-01 DIAGNOSIS — N6489 Other specified disorders of breast: Secondary | ICD-10-CM | POA: Diagnosis not present

## 2017-09-01 DIAGNOSIS — H26492 Other secondary cataract, left eye: Secondary | ICD-10-CM | POA: Diagnosis not present

## 2017-09-01 DIAGNOSIS — R922 Inconclusive mammogram: Secondary | ICD-10-CM | POA: Diagnosis not present

## 2017-09-01 DIAGNOSIS — H04123 Dry eye syndrome of bilateral lacrimal glands: Secondary | ICD-10-CM | POA: Diagnosis not present

## 2017-09-01 DIAGNOSIS — H1851 Endothelial corneal dystrophy: Secondary | ICD-10-CM | POA: Diagnosis not present

## 2017-09-01 DIAGNOSIS — Z961 Presence of intraocular lens: Secondary | ICD-10-CM | POA: Diagnosis not present

## 2017-09-06 ENCOUNTER — Ambulatory Visit (INDEPENDENT_AMBULATORY_CARE_PROVIDER_SITE_OTHER): Payer: Medicare Other | Admitting: Family Medicine

## 2017-09-06 ENCOUNTER — Encounter: Payer: Self-pay | Admitting: Family Medicine

## 2017-09-06 ENCOUNTER — Ambulatory Visit (INDEPENDENT_AMBULATORY_CARE_PROVIDER_SITE_OTHER)
Admission: RE | Admit: 2017-09-06 | Discharge: 2017-09-06 | Disposition: A | Discharge status: Home / Self Care | Payer: Medicare Other | Source: Ambulatory Visit | Attending: Family Medicine | Admitting: Family Medicine

## 2017-09-06 VITALS — BP 120/74 | HR 76 | Temp 98.6°F | Ht 66.0 in | Wt 160.8 lb

## 2017-09-06 DIAGNOSIS — R51 Headache: Secondary | ICD-10-CM

## 2017-09-06 DIAGNOSIS — F419 Anxiety disorder, unspecified: Secondary | ICD-10-CM

## 2017-09-06 DIAGNOSIS — R519 Headache, unspecified: Secondary | ICD-10-CM

## 2017-09-06 DIAGNOSIS — G2581 Restless legs syndrome: Secondary | ICD-10-CM

## 2017-09-06 NOTE — Progress Notes (Signed)
Amanda [name] is a 67 y.o. female is here for an acute issue.  History of Present Illness:   Lonell Grandchild, CMA acting as scribe for Dr. Briscoe Deutscher.   HPI: Patient having increased headache for about month. States that it feels like a tight band around top of head. They last around 20 minutes then go away. She will have them four to five times a day. Has tried over the counter medications for them with no help. She denies any vision or speech changes with headache.  History of thyroid cancer.  History of subdural bleed that needed evacuation status post surgery.  Patient feels that the headache is different from her usual headaches.  There are no preventive care reminders to display for this patient.   Depression screen [facility] 2/9 09/26/2016 02/19/2016  Decreased Interest 0 0  Down, Depressed, Hopeless 0 0  PHQ - 2 Score 0 0  Altered sleeping 0 -  Tired, decreased energy 0 -  Change in appetite 0 -  Feeling bad or failure about yourself  0 -  Trouble concentrating 0 -  Moving slowly or fidgety/restless 0 -  Suicidal thoughts 0 -  PHQ-9 Score 0 -  Difficult doing work/chores Not difficult at all -   PMHx, SurgHx, SocialHx, FamHx, Medications, and Allergies were reviewed in the Visit Navigator and updated as appropriate.   Patient Active Problem List   Diagnosis Date Noted  . Presbycusis of both ears 03/10/2017  . Dysthymia 09/26/2016  . PVC (premature ventricular contraction) 08/27/2016  . Anxiety 08/03/2016  . Laryngopharyngeal reflux (LPR) 07/26/2016  . Thyroid cancer ([place])   . GERD (gastroesophageal reflux disease)   . Myalgia and myositis, unspecified   . Restless leg syndrome   . Allergic rhinitis 06/25/2009  . Diaphragmatic hernia 02/16/2009  . IBS (irritable bowel syndrome) 02/16/2009  . Hyperlipidemia with target LDL less than 130 09/26/2008  . Celiac disease 11/27/2007  . Osteoarthritis 07/05/2007  . Osteopenia 10/03/2006  . Left carotid bruit 10/03/2006   . History of thyroid cancer 10/03/2006   Social History   Tobacco Use  . Smoking status: Never Smoker  . Smokeless tobacco: Never Used  Substance Use Topics  . Alcohol use: Yes    Alcohol/week: 1.2 - 1.8 oz    Types: 1 - 2 Cans of beer, 1 Standard drinks or equivalent per week  . Drug use: No   Current Medications and Allergies:   .  APPLE CIDER VINEGAR PO, Take 1 capsule by mouth 2 (two) times daily. Reported on 06/09/2015, Disp: , Rfl:  .  atorvastatin (LIPITOR) 20 MG tablet, Take 1 tablet (20 mg total) every other day by mouth., Disp: 45 tablet, Rfl: 2 .  bisacodyl (DULCOLAX) 5 MG EC tablet, Take 5 mg by mouth daily as needed for moderate constipation., Disp: , Rfl:  .  CALCIUM-VITAMIN D PO, Take 2 tablets by mouth daily., Disp: , Rfl:  .  clonazePAM (KLONOPIN) 1 MG tablet, TAKE 1/2 TO 1 TABLET BY MOUTH AT BEDTIME AS NEEDED FOR SLEEP, Disp: 30 tablet, Rfl: 0 .  CO ENZYME Q-10 PO, Take by mouth daily. , Disp: , Rfl:  .  cycloSPORINE (RESTASIS) 0.05 % ophthalmic emulsion, Place 1 drop into both eyes 2 (two) times daily., Disp: , Rfl:  .  escitalopram (LEXAPRO) 20 MG tablet, Take 1 tablet by mouth daily., Disp: , Rfl:  .  magnesium citrate SOLN, Take 1 Bottle by mouth as needed. , Disp: , Rfl:  .  metoprolol succinate (TOPROL-XL) 25 MG 24 hr tablet, TAKE 1 TABLET BY MOUTH IN THE EVENING MAY TAKE AN ADDITIONAL DOSE WITH ONSET OF PALPITATIONS, Disp: 60 tablet, Rfl: 6 .  Multiple Vitamin (MULTIVITAMIN) tablet, Take 1 tablet by mouth daily.  , Disp: , Rfl:  .  polyethylene glycol (MIRALAX / GLYCOLAX) packet, Take 17 g by mouth daily., Disp: , Rfl:  .  Propylene Glycol (SYSTANE BALANCE) 0.6 % SOLN, Apply 1 drop to eye 2 (two) times daily. For dry eyes, takes along with restasis, Disp: , Rfl:  .  rOPINIRole (REQUIP) 0.25 MG tablet, TAKE 1 TABLET BY MOUTH AT BEDTIME, Disp: 90 tablet, Rfl: 0 .  valACYclovir (VALTREX) 500 MG tablet, TAKE 1 TABLET BY MOUTH EVERY DAY (Patient taking differently:  TAKE 1 TABLET BY MOUTH PRN), Disp: 90 tablet, Rfl: 0   Allergies  Allergen Reactions  . Ambien [Zolpidem Tartrate] Other (See Comments)    Causes Neurological problems with very bad dizziness, pains, disorientation  . Cortisone Other (See Comments)    REACTION: "mania" post ESI  . Eszopiclone And Related Other (See Comments)    Causes Neurological problems with very bad dizziness, pains, disorientation  . Levofloxacin Other (See Comments)    Muscle spasms and jerking movements  . Dicloxacillin Other (See Comments)    Stomach problems, swelling of tongue  . Dilantin [Phenytoin Sodium Extended] Hives and Itching  . Erythromycin Other (See Comments)    Tears stomach up  . Avelox [Moxifloxacin Hcl In Nacl]     Pt unsure of reaction.  . Other     Celiac disease  . Nitrofuran Derivatives Other (See Comments)    unknown  . Phenytoin Sodium Extended Rash  . Trazodone And Nefazodone Other (See Comments)    unknown   Review of Systems   Pertinent items are noted in the HPI. Otherwise, ROS is negative.  Vitals:   Vitals:   09/06/17 1119  BP: 120/74  Pulse: 76  Temp: 98.6 F (37 C)  TempSrc: Oral  SpO2: 98%  Weight: 160 lb 12.8 oz (72.9 kg)  Height: 5' 6"  (1.676 m)     Body mass index is 25.95 kg/m.   Physical Exam:   Physical Exam  Constitutional: She is oriented to person, place, and time. She appears well-developed and well-nourished. No distress.  HENT:  Head: Normocephalic and atraumatic.  Right Ear: External ear normal.  Left Ear: External ear normal.  Nose: Nose normal.  Mouth/Throat: Oropharynx is clear and moist.  Eyes: Pupils are equal, round, and reactive to light. Conjunctivae and EOM are normal.  Neck: Normal range of motion. Neck supple. No thyromegaly present.  Cardiovascular: Normal rate, regular rhythm, normal heart sounds and intact distal pulses.  Pulmonary/Chest: Effort normal and breath sounds normal.  Abdominal: Soft. Bowel sounds are normal.    Musculoskeletal: Normal range of motion.  Lymphadenopathy:    She has no cervical adenopathy.  Neurological: She is alert and oriented to person, place, and time.  Skin: Skin is warm and dry. Capillary refill takes less than 2 seconds.  Psychiatric: She has a normal mood and affect. Her behavior is normal.  Nursing note and vitals reviewed.   Assessment and Plan:   Amanda [name] was seen today for headache.  Diagnoses and all orders for this visit:  Anxiety -     clonazePAM (KLONOPIN) 1 MG tablet; Take 0.5-1 tablets (0.5-1 mg total) by mouth at bedtime as needed. for sleep  Acute intractable headache, unspecified headache type Comments:  With patient's history and a atypical nature of her new headache, we will go ahead and add CT scan. Orders: -     CT Head Wo Contrast; Future  Restless leg syndrome -     rOPINIRole (REQUIP) 0.25 MG tablet; Take 1 tablet (0.25 mg total) by mouth at bedtime.   . Reviewed expectations re: course of current medical issues. . Discussed self-management of symptoms. . Outlined signs and symptoms indicating need for more acute intervention. . Patient verbalized understanding and all questions were answered. [name] Health Maintenance issues including appropriate healthy diet, exercise, and smoking avoidance were discussed with patient. . See orders for this visit as documented in the electronic medical record. . Patient received an After Visit Summary.  Briscoe Deutscher, DO [place], [place] 09/09/2017  Future Appointments  Date Time Provider [place]  10/06/2017  8:15 AM [facility]  10/11/2017 10:20 AM Briscoe Deutscher, DO [facility]  10/11/2017 11:00 AM Williemae Area, RN [facility]  11/17/2017  3:30 PM Pieter Partridge, DO [facility] None

## 2017-09-09 MED ORDER — ROPINIROLE HCL 0.25 MG PO TABS: 0.2500 mg | ORAL_TABLET | Freq: Every day | ORAL | 0 refills | Status: DC

## 2017-09-09 MED ORDER — CLONAZEPAM 1 MG PO TABS: 0.5000 mg | ORAL_TABLET | Freq: Every evening | ORAL | 0 refills | Status: DC | PRN

## 2017-09-15 DIAGNOSIS — L659 Nonscarring hair loss, unspecified: Secondary | ICD-10-CM | POA: Diagnosis not present

## 2017-09-15 DIAGNOSIS — R6889 Other general symptoms and signs: Secondary | ICD-10-CM | POA: Insufficient documentation

## 2017-09-15 DIAGNOSIS — H26492 Other secondary cataract, left eye: Secondary | ICD-10-CM | POA: Diagnosis not present

## 2017-09-15 DIAGNOSIS — R5383 Other fatigue: Secondary | ICD-10-CM | POA: Insufficient documentation

## 2017-09-15 DIAGNOSIS — H1859 Other hereditary corneal dystrophies: Secondary | ICD-10-CM | POA: Diagnosis not present

## 2017-09-15 DIAGNOSIS — K219 Gastro-esophageal reflux disease without esophagitis: Secondary | ICD-10-CM | POA: Diagnosis not present

## 2017-09-15 DIAGNOSIS — R5382 Chronic fatigue, unspecified: Secondary | ICD-10-CM | POA: Diagnosis not present

## 2017-09-15 DIAGNOSIS — H1851 Endothelial corneal dystrophy: Secondary | ICD-10-CM | POA: Diagnosis not present

## 2017-09-15 DIAGNOSIS — H04123 Dry eye syndrome of bilateral lacrimal glands: Secondary | ICD-10-CM | POA: Diagnosis not present

## 2017-09-18 ENCOUNTER — Telehealth: Payer: Self-pay | Admitting: Family Medicine

## 2017-09-18 ENCOUNTER — Other Ambulatory Visit: Payer: Self-pay

## 2017-09-18 DIAGNOSIS — G479 Sleep disorder, unspecified: Secondary | ICD-10-CM

## 2017-09-18 NOTE — Telephone Encounter (Signed)
I called and spoke with the rep at [facility] to check the status of the referral that was placed 04-06-17. She stated that they do have the referral however, they are unable to schedule until an order is placed as well.   Please place the order for the referral and their office will contact the patient to get scheduled.

## 2017-09-18 NOTE — Telephone Encounter (Signed)
Replaced order. L/m for patient on voice mail to let her know.

## 2017-09-27 ENCOUNTER — Ambulatory Visit: Payer: Medicare Other | Admitting: *Deleted

## 2017-09-28 ENCOUNTER — Ambulatory Visit: Payer: Medicare Other | Admitting: *Deleted

## 2017-10-04 ENCOUNTER — Encounter: Payer: Self-pay | Admitting: Family Medicine

## 2017-10-04 ENCOUNTER — Ambulatory Visit (INDEPENDENT_AMBULATORY_CARE_PROVIDER_SITE_OTHER): Payer: Medicare Other | Admitting: Family Medicine

## 2017-10-04 VITALS — BP 120/82 | HR 71 | Temp 98.1°F | Ht 66.0 in | Wt 156.6 lb

## 2017-10-04 DIAGNOSIS — R5383 Other fatigue: Secondary | ICD-10-CM | POA: Diagnosis not present

## 2017-10-04 DIAGNOSIS — E8881 Metabolic syndrome: Secondary | ICD-10-CM | POA: Diagnosis not present

## 2017-10-04 DIAGNOSIS — Z13220 Encounter for screening for lipoid disorders: Secondary | ICD-10-CM

## 2017-10-04 DIAGNOSIS — R739 Hyperglycemia, unspecified: Secondary | ICD-10-CM | POA: Diagnosis not present

## 2017-10-04 DIAGNOSIS — R350 Frequency of micturition: Secondary | ICD-10-CM | POA: Diagnosis not present

## 2017-10-04 DIAGNOSIS — D649 Anemia, unspecified: Secondary | ICD-10-CM | POA: Diagnosis not present

## 2017-10-04 DIAGNOSIS — E559 Vitamin D deficiency, unspecified: Secondary | ICD-10-CM | POA: Diagnosis not present

## 2017-10-04 DIAGNOSIS — E538 Deficiency of other specified B group vitamins: Secondary | ICD-10-CM | POA: Diagnosis not present

## 2017-10-04 DIAGNOSIS — E88819 Insulin resistance, unspecified: Secondary | ICD-10-CM

## 2017-10-04 LAB — LIPID PANEL
Cholesterol: 238 mg/dL — ABNORMAL HIGH (ref 0–200)
HDL: 78.4 mg/dL (ref >39.00)
LDL Cholesterol: 141 mg/dL — ABNORMAL HIGH (ref 0–99)
NonHDL: 159.55
Total CHOL/HDL Ratio: 3
Triglycerides: 91 mg/dL (ref 0.0–149.0)
VLDL: 18.2 mg/dL (ref 0.0–40.0)

## 2017-10-04 LAB — POCT URINALYSIS DIPSTICK
Bilirubin, UA: NEGATIVE
Blood, UA: NEGATIVE
Glucose, UA: NEGATIVE
Ketones, UA: NEGATIVE
Nitrite, UA: NEGATIVE
Protein, UA: NEGATIVE
Spec Grav, UA: 1.015 (ref 1.010–1.025)
Urobilinogen, UA: 0.2 E.U./dL
pH, UA: 7 (ref 5.0–8.0)

## 2017-10-04 LAB — CBC WITH DIFFERENTIAL/PLATELET
Basophils Absolute: 0 10*3/uL (ref 0.0–0.1)
Basophils Relative: 0.7 % (ref 0.0–3.0)
Eosinophils Absolute: 0.1 10*3/uL (ref 0.0–0.7)
Eosinophils Relative: 2 % (ref 0.0–5.0)
HCT: 39.7 % (ref 36.0–46.0)
Hemoglobin: 13.4 g/dL (ref 12.0–15.0)
Lymphocytes Relative: 23.4 % (ref 12.0–46.0)
Lymphs Abs: 1.2 10*3/uL (ref 0.7–4.0)
MCHC: 33.7 g/dL (ref 30.0–36.0)
MCV: 91.4 fl (ref 78.0–100.0)
Monocytes Absolute: 0.3 10*3/uL (ref 0.1–1.0)
Monocytes Relative: 6.6 % (ref 3.0–12.0)
Neutro Abs: 3.5 10*3/uL (ref 1.4–7.7)
Neutrophils Relative %: 67.3 % (ref 43.0–77.0)
Platelets: 237 10*3/uL (ref 150.0–400.0)
RBC: 4.34 Mil/uL (ref 3.87–5.11)
RDW: 13 % (ref 11.5–15.5)
WBC: 5.2 10*3/uL (ref 4.0–10.5)

## 2017-10-04 LAB — VITAMIN D 25 HYDROXY (VIT D DEFICIENCY, FRACTURES): VITD: 30.81 ng/mL (ref 30.00–100.00)

## 2017-10-04 LAB — COMPREHENSIVE METABOLIC PANEL
ALT: 16 U/L (ref 0–35)
AST: 20 U/L (ref 0–37)
Albumin: 5 g/dL (ref 3.5–5.2)
Alkaline Phosphatase: 76 U/L (ref 39–117)
BUN: 19 mg/dL (ref 6–23)
CO2: 30 mEq/L (ref 19–32)
Calcium: 10.1 mg/dL (ref 8.4–10.5)
Chloride: 102 mEq/L (ref 96–112)
Creatinine, Ser: 0.8 mg/dL (ref 0.40–1.20)
GFR: 75.93 mL/min (ref >60.00)
Glucose, Bld: 110 mg/dL — ABNORMAL HIGH (ref 70–99)
Potassium: 4.5 mEq/L (ref 3.5–5.1)
Sodium: 142 mEq/L (ref 135–145)
Total Bilirubin: 0.6 mg/dL (ref 0.2–1.2)
Total Protein: 8.1 g/dL (ref 6.0–8.3)

## 2017-10-04 LAB — TSH: TSH: 2.51 u[IU]/mL (ref 0.35–4.50)

## 2017-10-04 LAB — SEDIMENTATION RATE: Sed Rate: 28 mm/hr (ref 0–30)

## 2017-10-04 LAB — FERRITIN: Ferritin: 26.2 ng/mL (ref 10.0–291.0)

## 2017-10-04 LAB — VITAMIN B12: Vitamin B-12: >1500 pg/mL — ABNORMAL HIGH (ref 211–911)

## 2017-10-04 LAB — HEMOGLOBIN A1C: Hgb A1c MFr Bld: 5.7 % (ref 4.6–6.5)

## 2017-10-04 LAB — C-REACTIVE PROTEIN: CRP: 0.1 mg/dL — ABNORMAL LOW (ref 0.5–20.0)

## 2017-10-04 NOTE — Progress Notes (Signed)
Amanda [name] is a 67 y.o. female is here for follow up.  History of Present Illness:  Amanda [name] acting as scribe for Dr. Juleen [name].  HPI: Patient comes in today for multiple concerns today.   Fatigue: Patient stated that she is having a lot more fatigue lately. She states that she is having brain fog now. Her husband that is with her today says that she doe snot even want to get out of bed. She went to work out yesterday, but she had to make herself get up to do this. Patients husband says that she goes to bed late and gets up early. He says that she does not have a good sleep pattern.   Fibromyalgia: Discussed with the patient that she most likely has fibromyalgia. She does have an autoimmune disease so this goes along with this. This could be where her body pain is coming from and the fatigue could also be coming from this. Sleep is essential with this and she is not getting the sleep. Patient has an appointment with neurology 11/2017.     There are no preventive care reminders to display for this patient. Depression screen [facility] 2/9 10/04/2017 09/26/2016 02/19/2016  Decreased Interest 2 0 0  Down, Depressed, Hopeless 1 0 0  PHQ - 2 Score 3 0 0  Altered sleeping 1 0 -  Tired, decreased energy 2 0 -  Change in appetite 2 0 -  Feeling bad or failure about yourself  0 0 -  Trouble concentrating 2 0 -  Moving slowly or fidgety/restless 0 0 -  Suicidal thoughts 0 0 -  PHQ-9 Score 10 0 -  Difficult doing work/chores Very difficult Not difficult at all -  Some recent data might be hidden   PMHx, SurgHx, SocialHx, FamHx, Medications, and Allergies were reviewed in the Visit Navigator and updated as appropriate.   Patient Active Problem List   Diagnosis Date Noted  . Hair loss 09/15/2017  . Fatigue 09/15/2017  . Cold intolerance 09/15/2017  . Presbycusis of both ears 03/10/2017  . Dysthymia 09/26/2016  . PVC (premature ventricular contraction) 08/27/2016  . Anxiety  08/03/2016  . Laryngopharyngeal reflux (LPR) 07/26/2016  . Thyroid cancer ([place])   . GERD (gastroesophageal reflux disease)   . Myalgia and myositis, unspecified   . Restless leg syndrome   . Allergic rhinitis 06/25/2009  . Diaphragmatic hernia 02/16/2009  . IBS (irritable bowel syndrome) 02/16/2009  . Hyperlipidemia with target LDL less than 130 09/26/2008  . Celiac disease 11/27/2007  . Osteoarthritis 07/05/2007  . Osteopenia 10/03/2006  . Left carotid bruit 10/03/2006  . History of thyroid cancer 10/03/2006   Social History   Tobacco Use  . Smoking status: Never Smoker  . Smokeless tobacco: Never Used  Substance Use Topics  . Alcohol use: Yes    Alcohol/week: 1.2 - 1.8 oz    Types: 1 - 2 Cans of beer, 1 Standard drinks or equivalent per week  . Drug use: No   Current Medications and Allergies:   Current Outpatient Medications:  .  APPLE CIDER VINEGAR PO, Take 1 capsule by mouth 2 (two) times daily. Reported on 06/09/2015, Disp: , Rfl:  .  atorvastatin (LIPITOR) 20 MG tablet, Take 1 tablet (20 mg total) every other day by mouth., Disp: 45 tablet, Rfl: 2 .  bisacodyl (DULCOLAX) 5 MG EC tablet, Take 5 mg by mouth daily as needed for moderate constipation., Disp: , Rfl:  .  CALCIUM-VITAMIN D PO, Take  2 tablets by mouth daily., Disp: , Rfl:  .  clonazePAM (KLONOPIN) 1 MG tablet, Take 0.5-1 tablets (0.5-1 mg total) by mouth at bedtime as needed. for sleep, Disp: 45 tablet, Rfl: 0 .  CO ENZYME Q-10 PO, Take by mouth daily. , Disp: , Rfl:  .  cycloSPORINE (RESTASIS) 0.05 % ophthalmic emulsion, Place 1 drop into both eyes 2 (two) times daily., Disp: , Rfl:  .  escitalopram (LEXAPRO) 20 MG tablet, Take 1 tablet by mouth daily., Disp: , Rfl:  .  magnesium citrate SOLN, Take 1 Bottle by mouth as needed. , Disp: , Rfl:  .  metoprolol succinate (TOPROL-XL) 25 MG 24 hr tablet, TAKE 1 TABLET BY MOUTH IN THE EVENING MAY TAKE AN ADDITIONAL DOSE WITH ONSET OF PALPITATIONS, Disp: 60 tablet, Rfl:  6 .  Multiple Vitamin (MULTIVITAMIN) tablet, Take 1 tablet by mouth daily.  , Disp: , Rfl:  .  polyethylene glycol (MIRALAX / GLYCOLAX) packet, Take 17 g by mouth daily., Disp: , Rfl:  .  Propylene Glycol (SYSTANE BALANCE) 0.6 % SOLN, Apply 1 drop to eye 2 (two) times daily. For dry eyes, takes along with restasis, Disp: , Rfl:  .  rOPINIRole (REQUIP) 0.25 MG tablet, Take 1 tablet (0.25 mg total) by mouth at bedtime., Disp: 90 tablet, Rfl: 0 .  valACYclovir (VALTREX) 500 MG tablet, TAKE 1 TABLET BY MOUTH EVERY DAY (Patient taking differently: TAKE 1 TABLET BY MOUTH PRN), Disp: 90 tablet, Rfl: 0   Allergies  Allergen Reactions  . Ambien [Zolpidem Tartrate] Other (See Comments)    Causes Neurological problems with very bad dizziness, pains, disorientation  . Cortisone Other (See Comments)    REACTION: "mania" post ESI  . Eszopiclone And Related Other (See Comments)    Causes Neurological problems with very bad dizziness, pains, disorientation  . Levofloxacin Other (See Comments)    Muscle spasms and jerking movements  . Dicloxacillin Other (See Comments)    Stomach problems, swelling of tongue  . Dilantin [Phenytoin Sodium Extended] Hives and Itching  . Erythromycin Other (See Comments)    Tears stomach up  . Avelox [Moxifloxacin Hcl In Nacl]     Pt unsure of reaction.  . Other     Celiac disease  . Nitrofuran Derivatives Other (See Comments)    unknown  . Phenytoin Sodium Extended Rash  . Trazodone And Nefazodone Other (See Comments)    unknown   Review of Systems   Pertinent items are noted in the HPI. Otherwise, ROS is negative.  Vitals:   Vitals:   10/04/17 0852  BP: 120/82  Pulse: 71  Temp: 98.1 F (36.7 C)  TempSrc: Oral  SpO2: 98%  Weight: 156 lb 9.6 oz (71 kg)  Height: 5' 6"  (1.676 m)     Body mass index is 25.28 kg/m.  Physical Exam:   Physical Exam  Constitutional: She is oriented to person, place, and time. She appears well-developed and  well-nourished. No distress.  HENT:  Head: Normocephalic and atraumatic.  Right Ear: External ear normal.  Left Ear: External ear normal.  Nose: Nose normal.  Mouth/Throat: Oropharynx is clear and moist.  Eyes: Pupils are equal, round, and reactive to light. Conjunctivae and EOM are normal.  Neck: Normal range of motion. Neck supple. No thyromegaly present.  Cardiovascular: Normal rate, regular rhythm, normal heart sounds and intact distal pulses.  Pulmonary/Chest: Effort normal and breath sounds normal.  Abdominal: Soft. Bowel sounds are normal.  Musculoskeletal: Normal range of motion.  Multiple tender spots.   Lymphadenopathy:    She has no cervical adenopathy.  Neurological: She is alert and oriented to person, place, and time.  Skin: Skin is warm and dry. Capillary refill takes less than 2 seconds.  Psychiatric: She has a normal mood and affect. Her behavior is normal.  Nursing note and vitals reviewed.  Assessment and Plan:   Diagnoses and all orders for this visit:  Frequent urination -     POCT urinalysis dipstick -     Urine Culture  Fatigue, unspecified type -     PTH, Intact and Calcium -     TSH -     C-reactive protein -     Sedimentation rate -     ANA -     Rheumatoid factor -     Thyroid peroxidase antibody -     Comprehensive metabolic panel -     CBC with Differential/Platelet  Vitamin D deficiency -     VITAMIN D 25 Hydroxy (Vit-D Deficiency, Fractures)  Vitamin B 12 deficiency -     Vitamin B12  Insulin resistance  Hyperglycemia -     Hemoglobin A1c  Anemia, unspecified type -     Ferritin  Screening for lipid disorders -     Lipid panel    . Reviewed expectations re: course of current medical issues. . Discussed self-management of symptoms. . Outlined signs and symptoms indicating need for more acute intervention. . Patient verbalized understanding and all questions were answered. [name] Health Maintenance issues including appropriate  healthy diet, exercise, and smoking avoidance were discussed with patient. . See orders for this visit as documented in the electronic medical record. . Patient received an After Visit Summary.  Briscoe Deutscher, DO [place] 10/10/2017  Future Appointments  Date Time Provider [place]  10/10/2017 11:00 AM Briscoe Deutscher, DO [facility]  11/16/2017 10:00 AM [facility] LAB [facility]  11/17/2017  3:30 PM Pieter Partridge, DO [facility] None  11/22/2017 10:00 AM Williemae Area, RN [facility]  11/22/2017 11:00 AM Briscoe Deutscher, DO [facility]    [name] served as scribe during this visit. History, Physical, and Plan performed by medical provider. The above documentation has been reviewed and is accurate and complete. Briscoe Deutscher, D.O.

## 2017-10-05 LAB — URINE CULTURE
MICRO NUMBER:: 90646613
SPECIMEN QUALITY:: ADEQUATE

## 2017-10-05 LAB — PTH, INTACT AND CALCIUM
Calcium: 9.7 mg/dL (ref 8.6–10.4)
PTH: 41 pg/mL (ref 14–64)

## 2017-10-06 ENCOUNTER — Other Ambulatory Visit: Payer: Medicare Other

## 2017-10-06 LAB — THYROID PEROXIDASE ANTIBODY: Thyroperoxidase Ab SerPl-aCnc: <1 IU/mL (ref <9)

## 2017-10-06 LAB — ANA: Anti Nuclear Antibody(ANA): NEGATIVE

## 2017-10-06 LAB — RHEUMATOID FACTOR: Rhuematoid fact SerPl-aCnc: <14 IU/mL (ref <14)

## 2017-10-09 NOTE — Progress Notes (Signed)
Amanda [name] is a 67 y.o. female is here for follow up.  History of Present Illness:   HPI: Patient presents to discuss labs.  Results for orders placed or performed in visit on 10/04/17  Urine Culture  Result Value Ref Range   MICRO NUMBER: [id number]    SPECIMEN QUALITY: ADEQUATE    Sample Source NOT GIVEN    STATUS: FINAL    Result:      Multiple organisms present, each less than 10,000 CFU/mL. These organisms, commonly found on external and internal genitalia, are considered to be colonizers. No further testing performed.  Hemoglobin A1c  Result Value Ref Range   Hgb A1c MFr Bld 5.7 4.6 - 6.5 %  PTH, Intact and Calcium  Result Value Ref Range   PTH 41 14 - 64 pg/mL   Calcium 9.7 8.6 - 10.4 mg/dL  Vitamin B12  Result Value Ref Range   Vitamin B-12 >1500 (H) 211 - 911 pg/mL  VITAMIN D 25 Hydroxy (Vit-D Deficiency, Fractures)  Result Value Ref Range   VITD 30.81 30.00 - 100.00 ng/mL  TSH  Result Value Ref Range   TSH 2.51 0.35 - 4.50 uIU/mL  C-reactive protein  Result Value Ref Range   CRP 0.1 (L) 0.5 - 20.0 mg/dL  Sedimentation rate  Result Value Ref Range   Sed Rate 28 0 - 30 mm/hr  ANA  Result Value Ref Range   Anti Nuclear Antibody(ANA) NEGATIVE NEGATIVE  Rheumatoid factor  Result Value Ref Range   Rhuematoid fact SerPl-aCnc <14 <14 IU/mL  Thyroid peroxidase antibody  Result Value Ref Range   Thyroperoxidase Ab SerPl-aCnc <1 <9 IU/mL  Ferritin  Result Value Ref Range   Ferritin 26.2 10.0 - 291.0 ng/mL  Lipid panel  Result Value Ref Range   Cholesterol 238 (H) 0 - 200 mg/dL   Triglycerides 91.0 0.0 - 149.0 mg/dL   HDL 78.40 >39.00 mg/dL   VLDL 18.2 0.0 - 40.0 mg/dL   LDL Cholesterol 141 (H) 0 - 99 mg/dL   Total CHOL/HDL Ratio 3    NonHDL 159.55   Comprehensive metabolic panel  Result Value Ref Range   Sodium 142 135 - 145 mEq/L   Potassium 4.5 3.5 - 5.1 mEq/L   Chloride 102 96 - 112 mEq/L   CO2 30 19 - 32 mEq/L   Glucose, Bld 110 (H) 70 - 99  mg/dL   BUN 19 6 - 23 mg/dL   Creatinine, Ser 0.80 0.40 - 1.20 mg/dL   Total Bilirubin 0.6 0.2 - 1.2 mg/dL   Alkaline Phosphatase 76 39 - 117 U/L   AST 20 0 - 37 U/L   ALT 16 0 - 35 U/L   Total Protein 8.1 6.0 - 8.3 g/dL   Albumin 5.0 3.5 - 5.2 g/dL   Calcium 10.1 8.4 - 10.5 mg/dL   GFR 75.93 >60.00 mL/min  CBC with Differential/Platelet  Result Value Ref Range   WBC 5.2 4.0 - 10.5 K/uL   RBC 4.34 3.87 - 5.11 Mil/uL   Hemoglobin 13.4 12.0 - 15.0 g/dL   HCT 39.7 36.0 - 46.0 %   MCV 91.4 78.0 - 100.0 fl   MCHC 33.7 30.0 - 36.0 g/dL   RDW 13.0 11.5 - 15.5 %   Platelets 237.0 150.0 - 400.0 K/uL   Neutrophils Relative % 67.3 43.0 - 77.0 %   Lymphocytes Relative 23.4 12.0 - 46.0 %   Monocytes Relative 6.6 3.0 - 12.0 %   Eosinophils Relative 2.0  0.0 - 5.0 %   Basophils Relative 0.7 0.0 - 3.0 %   Neutro Abs 3.5 1.4 - 7.7 K/uL   Lymphs Abs 1.2 0.7 - 4.0 K/uL   Monocytes Absolute 0.3 0.1 - 1.0 K/uL   Eosinophils Absolute 0.1 0.0 - 0.7 K/uL   Basophils Absolute 0.0 0.0 - 0.1 K/uL  POCT urinalysis dipstick  Result Value Ref Range   Color, UA Yellow    Clarity, UA Clear    Glucose, UA Negative Negative   Bilirubin, UA Negative    Ketones, UA Negative    Spec Grav, UA 1.015 1.010 - 1.025   Blood, UA Negative    pH, UA 7.0 5.0 - 8.0   Protein, UA Negative Negative   Urobilinogen, UA 0.2 0.2 or 1.0 E.U./dL   Nitrite, UA Negative    Leukocytes, UA Trace (A) Negative   Appearance     Odor     There are no preventive care reminders to display for this patient. Depression screen [facility] 2/9 10/04/2017 09/26/2016 02/19/2016  Decreased Interest 2 0 0  Down, Depressed, Hopeless 1 0 0  PHQ - 2 Score 3 0 0  Altered sleeping 1 0 -  Tired, decreased energy 2 0 -  Change in appetite 2 0 -  Feeling bad or failure about yourself  0 0 -  Trouble concentrating 2 0 -  Moving slowly or fidgety/restless 0 0 -  Suicidal thoughts 0 0 -  PHQ-9 Score 10 0 -  Difficult doing work/chores Very difficult  Not difficult at all -  Some recent data might be hidden   PMHx, SurgHx, SocialHx, FamHx, Medications, and Allergies were reviewed in the Visit Navigator and updated as appropriate.   Patient Active Problem List   Diagnosis Date Noted  . Hair loss 09/15/2017  . Fatigue 09/15/2017  . Cold intolerance 09/15/2017  . Presbycusis of both ears 03/10/2017  . Dysthymia 09/26/2016  . PVC (premature ventricular contraction) 08/27/2016  . Anxiety 08/03/2016  . Laryngopharyngeal reflux (LPR) 07/26/2016  . Thyroid cancer ([place])   . GERD (gastroesophageal reflux disease)   . Myalgia and myositis, unspecified   . Restless leg syndrome   . Allergic rhinitis 06/25/2009  . Diaphragmatic hernia 02/16/2009  . IBS (irritable bowel syndrome) 02/16/2009  . Hyperlipidemia with target LDL less than 130 09/26/2008  . Celiac disease 11/27/2007  . Osteoarthritis 07/05/2007  . Osteopenia 10/03/2006  . Left carotid bruit 10/03/2006  . History of thyroid cancer 10/03/2006   Social History   Tobacco Use  . Smoking status: Never Smoker  . Smokeless tobacco: Never Used  Substance Use Topics  . Alcohol use: Yes    Alcohol/week: 1.2 - 1.8 oz    Types: 1 - 2 Cans of beer, 1 Standard drinks or equivalent per week  . Drug use: No   Current Medications and Allergies:   Current Outpatient Medications:  .  APPLE CIDER VINEGAR PO, Take 1 capsule by mouth 2 (two) times daily. Reported on 06/09/2015, Disp: , Rfl:  .  atorvastatin (LIPITOR) 20 MG tablet, Take 1 tablet (20 mg total) every other day by mouth., Disp: 45 tablet, Rfl: 2 .  bisacodyl (DULCOLAX) 5 MG EC tablet, Take 5 mg by mouth daily as needed for moderate constipation., Disp: , Rfl:  .  CALCIUM-VITAMIN D PO, Take 2 tablets by mouth daily., Disp: , Rfl:  .  CO ENZYME Q-10 PO, Take by mouth daily. , Disp: , Rfl:  .  escitalopram (LEXAPRO) 20  MG tablet, Take 1 tablet by mouth daily., Disp: , Rfl:  .  magnesium citrate SOLN, Take 1 Bottle by mouth as  needed. , Disp: , Rfl:  .  metoprolol succinate (TOPROL-XL) 25 MG 24 hr tablet, TAKE 1 TABLET BY MOUTH IN THE EVENING MAY TAKE AN ADDITIONAL DOSE WITH ONSET OF PALPITATIONS, Disp: 60 tablet, Rfl: 6 .  Multiple Vitamin (MULTIVITAMIN) tablet, Take 1 tablet by mouth daily.  , Disp: , Rfl:  .  polyethylene glycol (MIRALAX / GLYCOLAX) packet, Take 17 g by mouth daily., Disp: , Rfl:  .  Propylene Glycol (SYSTANE BALANCE) 0.6 % SOLN, Apply 1 drop to eye 2 (two) times daily. For dry eyes, takes along with restasis, Disp: , Rfl:  .  rOPINIRole (REQUIP) 0.25 MG tablet, Take 1 tablet (0.25 mg total) by mouth at bedtime., Disp: 90 tablet, Rfl: 2 .  clonazePAM (KLONOPIN) 1 MG tablet, Take 1 tablet (1 mg total) by mouth daily as needed for anxiety., Disp: 30 tablet, Rfl: 2 .  valACYclovir (VALTREX) 500 MG tablet, Take 1 tablet (500 mg total) by mouth 2 (two) times daily., Disp: 10 tablet, Rfl: 0   Allergies  Allergen Reactions  . Ambien [Zolpidem Tartrate] Other (See Comments)    Causes Neurological problems with very bad dizziness, pains, disorientation  . Cortisone Other (See Comments)    REACTION: "mania" post ESI  . Eszopiclone And Related Other (See Comments)    Causes Neurological problems with very bad dizziness, pains, disorientation  . Levofloxacin Other (See Comments)    Muscle spasms and jerking movements  . Dicloxacillin Other (See Comments)    Stomach problems, swelling of tongue  . Dilantin [Phenytoin Sodium Extended] Hives and Itching  . Erythromycin Other (See Comments)    Tears stomach up  . Avelox [Moxifloxacin Hcl In Nacl]     Pt unsure of reaction.  . Other     Celiac disease  . Nitrofuran Derivatives Other (See Comments)    unknown  . Phenytoin Sodium Extended Rash  . Trazodone And Nefazodone Other (See Comments)    unknown   Review of Systems   Pertinent items are noted in the HPI. Otherwise, ROS is negative.  Vitals:   Vitals:   10/10/17 1104  BP: 118/78  Pulse:  65  Temp: 98.3 F (36.8 C)  TempSrc: Oral  SpO2: 97%  Weight: 158 lb (71.7 kg)  Height: 5' 6"  (1.676 m)     Body mass index is 25.5 kg/m.  Physical Exam:   Physical Exam  Constitutional: She is oriented to person, place, and time. She appears well-developed and well-nourished. No distress.  HENT:  Head: Normocephalic and atraumatic.  Right Ear: External ear normal.  Left Ear: External ear normal.  Nose: Nose normal.  Mouth/Throat: Oropharynx is clear and moist.  Eyes: Pupils are equal, round, and reactive to light. Conjunctivae and EOM are normal.  Neck: Normal range of motion. Neck supple. No thyromegaly present.  Cardiovascular: Normal rate, regular rhythm, normal heart sounds and intact distal pulses.  Pulmonary/Chest: Effort normal and breath sounds normal.  Abdominal: Soft. Bowel sounds are normal.  Musculoskeletal: Normal range of motion.  Lymphadenopathy:    She has no cervical adenopathy.  Neurological: She is alert and oriented to person, place, and time.  Skin: Skin is warm and dry. Capillary refill takes less than 2 seconds.  Psychiatric: She has a normal mood and affect. Her behavior is normal.  Nursing note and vitals reviewed.   Assessment and  Plan:   Diagnoses and all orders for this visit:  Chronic fatigue Comments: Working on sleep.  Fibromyalgia Comments: New diagnosis. Working on exercise, anti-inflammatory diet.   Restless leg syndrome -     rOPINIRole (REQUIP) 0.25 MG tablet; Take 1 tablet (0.25 mg total) by mouth at bedtime.  Other orders -     clonazePAM (KLONOPIN) 1 MG tablet; Take 1 tablet (1 mg total) by mouth daily as needed for anxiety. -     valACYclovir (VALTREX) 500 MG tablet; Take 1 tablet (500 mg total) by mouth 2 (two) times daily.    . Reviewed expectations re: course of current medical issues. . Discussed self-management of symptoms. . Outlined signs and symptoms indicating need for more acute intervention. . Patient  verbalized understanding and all questions were answered. [name] Health Maintenance issues including appropriate healthy diet, exercise, and smoking avoidance were discussed with patient. . See orders for this visit as documented in the electronic medical record. . Patient received an After Visit Summary.  Briscoe Deutscher, DO [place] 10/17/2017  Future Appointments  Date Time Provider [place]  11/16/2017 10:00 AM [facility] LAB [facility]  11/17/2017  3:30 PM Metta Clines R, DO [facility] None  11/22/2017 10:00 AM Williemae Area, RN [facility]  11/22/2017 11:00 AM Briscoe Deutscher, DO [facility]

## 2017-10-10 ENCOUNTER — Encounter: Payer: Self-pay | Admitting: Family Medicine

## 2017-10-10 ENCOUNTER — Ambulatory Visit (INDEPENDENT_AMBULATORY_CARE_PROVIDER_SITE_OTHER): Payer: Medicare Other | Admitting: Family Medicine

## 2017-10-10 VITALS — BP 118/78 | HR 65 | Temp 98.3°F | Ht 66.0 in | Wt 158.0 lb

## 2017-10-10 DIAGNOSIS — M797 Fibromyalgia: Secondary | ICD-10-CM | POA: Diagnosis not present

## 2017-10-10 DIAGNOSIS — G2581 Restless legs syndrome: Secondary | ICD-10-CM | POA: Diagnosis not present

## 2017-10-10 DIAGNOSIS — R5382 Chronic fatigue, unspecified: Secondary | ICD-10-CM | POA: Diagnosis not present

## 2017-10-10 MED ORDER — ROPINIROLE HCL 0.25 MG PO TABS: 0.2500 mg | ORAL_TABLET | Freq: Every day | ORAL | 2 refills | Status: DC

## 2017-10-10 MED ORDER — CLONAZEPAM 1 MG PO TABS: 1.0000 mg | ORAL_TABLET | Freq: Every day | ORAL | 2 refills | Status: DC | PRN

## 2017-10-10 MED ORDER — VALACYCLOVIR HCL 500 MG PO TABS: 500.0000 mg | ORAL_TABLET | Freq: Two times a day (BID) | ORAL | 0 refills | Status: DC

## 2017-10-10 NOTE — Patient Instructions (Signed)
It looks like we have already added the sleep study referral. I'll try to make it more urgent.

## 2017-10-11 ENCOUNTER — Encounter: Payer: Medicare Other | Admitting: Family Medicine

## 2017-10-11 ENCOUNTER — Ambulatory Visit: Payer: Medicare Other | Admitting: *Deleted

## 2017-10-13 ENCOUNTER — Encounter: Payer: Medicare Other | Admitting: Family Medicine

## 2017-10-17 ENCOUNTER — Encounter: Payer: Self-pay | Admitting: Family Medicine

## 2017-10-20 DIAGNOSIS — D2271 Melanocytic nevi of right lower limb, including hip: Secondary | ICD-10-CM | POA: Diagnosis not present

## 2017-10-20 DIAGNOSIS — D225 Melanocytic nevi of trunk: Secondary | ICD-10-CM | POA: Diagnosis not present

## 2017-10-20 DIAGNOSIS — L812 Freckles: Secondary | ICD-10-CM | POA: Diagnosis not present

## 2017-10-20 DIAGNOSIS — D2272 Melanocytic nevi of left lower limb, including hip: Secondary | ICD-10-CM | POA: Diagnosis not present

## 2017-10-20 DIAGNOSIS — D2262 Melanocytic nevi of left upper limb, including shoulder: Secondary | ICD-10-CM | POA: Diagnosis not present

## 2017-10-20 DIAGNOSIS — D2261 Melanocytic nevi of right upper limb, including shoulder: Secondary | ICD-10-CM | POA: Diagnosis not present

## 2017-10-20 DIAGNOSIS — L821 Other seborrheic keratosis: Secondary | ICD-10-CM | POA: Diagnosis not present

## 2017-10-20 DIAGNOSIS — D1801 Hemangioma of skin and subcutaneous tissue: Secondary | ICD-10-CM | POA: Diagnosis not present

## 2017-10-20 DIAGNOSIS — L650 Telogen effluvium: Secondary | ICD-10-CM | POA: Diagnosis not present

## 2017-10-20 DIAGNOSIS — Z85828 Personal history of other malignant neoplasm of skin: Secondary | ICD-10-CM | POA: Diagnosis not present

## 2017-10-25 DIAGNOSIS — M25551 Pain in right hip: Secondary | ICD-10-CM | POA: Diagnosis not present

## 2017-10-25 DIAGNOSIS — M79671 Pain in right foot: Secondary | ICD-10-CM | POA: Diagnosis not present

## 2017-10-25 DIAGNOSIS — M7671 Peroneal tendinitis, right leg: Secondary | ICD-10-CM | POA: Diagnosis not present

## 2017-11-15 DIAGNOSIS — Z961 Presence of intraocular lens: Secondary | ICD-10-CM | POA: Diagnosis not present

## 2017-11-15 DIAGNOSIS — H26492 Other secondary cataract, left eye: Secondary | ICD-10-CM | POA: Diagnosis not present

## 2017-11-15 DIAGNOSIS — M797 Fibromyalgia: Secondary | ICD-10-CM | POA: Diagnosis not present

## 2017-11-15 DIAGNOSIS — H04123 Dry eye syndrome of bilateral lacrimal glands: Secondary | ICD-10-CM | POA: Diagnosis not present

## 2017-11-15 DIAGNOSIS — H1851 Endothelial corneal dystrophy: Secondary | ICD-10-CM | POA: Diagnosis not present

## 2017-11-15 DIAGNOSIS — H1859 Other hereditary corneal dystrophies: Secondary | ICD-10-CM | POA: Diagnosis not present

## 2017-11-16 ENCOUNTER — Other Ambulatory Visit: Payer: Medicare Other

## 2017-11-17 ENCOUNTER — Ambulatory Visit: Payer: Medicare Other | Admitting: Neurology

## 2017-11-17 ENCOUNTER — Encounter

## 2017-11-22 ENCOUNTER — Ambulatory Visit: Payer: Medicare Other | Admitting: *Deleted

## 2017-11-22 ENCOUNTER — Encounter: Payer: Medicare Other | Admitting: Family Medicine

## 2017-12-13 ENCOUNTER — Ambulatory Visit: Payer: Medicare Other | Admitting: *Deleted

## 2017-12-13 ENCOUNTER — Encounter: Payer: Medicare Other | Admitting: Family Medicine

## 2017-12-14 ENCOUNTER — Encounter: Payer: Self-pay | Admitting: Gynecology

## 2017-12-14 ENCOUNTER — Ambulatory Visit (INDEPENDENT_AMBULATORY_CARE_PROVIDER_SITE_OTHER): Payer: Medicare Other | Admitting: Gynecology

## 2017-12-14 VITALS — BP 118/74

## 2017-12-14 DIAGNOSIS — N644 Mastodynia: Secondary | ICD-10-CM | POA: Diagnosis not present

## 2017-12-14 NOTE — Patient Instructions (Signed)
Call if the breast pain continues

## 2017-12-14 NOTE — Progress Notes (Signed)
    Amanda [name] [birth year] [id number]        67 y.o.  G2P2002 presents with a one-week history of left breast pain.  Is primarily around her left nipple sharp stabbing and fleeting comes and goes.  No masses palpated on self-exam.  No nipple discharge.  Recent mammography 08/2017 negative.  Past medical history,surgical history, problem list, medications, allergies, family history and social history were all reviewed and documented in the EPIC chart.  Directed ROS with pertinent positives and negatives documented in the history of present illness/assessment and plan.  Exam: [name] assistant Vitals:   12/14/17 1504  BP: 118/74   General appearance:  Normal Both breasts examined lying and sitting without masses, retractions, discharge, adenopathy.  Well-healed reduction scars bilaterally.  Assessment/Plan:  66 y.o. [id number] with new onset fleeting left breast pain.  Negative exam.  Discussed possibilities with the patient.  Recommended heat over this area for now and to monitor for the next week or 2.  If it continues she is going to call me and we will pursue a more involved evaluation to include ultrasound and diagnostic mammography.  If resolves then will follow expectantly with self breast exams.    [name], 3:19 PM 12/14/2017

## 2017-12-18 DIAGNOSIS — M79671 Pain in right foot: Secondary | ICD-10-CM | POA: Diagnosis not present

## 2017-12-18 DIAGNOSIS — M25551 Pain in right hip: Secondary | ICD-10-CM | POA: Diagnosis not present

## 2017-12-27 ENCOUNTER — Institutional Professional Consult (permissible substitution): Payer: Medicare Other | Admitting: Neurology

## 2018-01-04 ENCOUNTER — Telehealth: Payer: Self-pay | Admitting: *Deleted

## 2018-01-04 DIAGNOSIS — N644 Mastodynia: Secondary | ICD-10-CM

## 2018-01-04 NOTE — Telephone Encounter (Signed)
Patient called back and said still having issue with left breast, per note on 12/14/17 "If it continues she is going to call me and we will pursue a more involved evaluation to include ultrasound and diagnostic mammography.   Orders placed at breast center,number given to schedule.

## 2018-01-04 NOTE — Telephone Encounter (Signed)
appointment on 01/09/18 @ 12:50pm

## 2018-01-09 ENCOUNTER — Ambulatory Visit
Admission: RE | Admit: 2018-01-09 | Discharge: 2018-01-09 | Disposition: A | Discharge status: Home / Self Care | Payer: Medicare Other | Source: Ambulatory Visit | Attending: Gynecology | Admitting: Gynecology

## 2018-01-09 DIAGNOSIS — N644 Mastodynia: Secondary | ICD-10-CM

## 2018-01-09 DIAGNOSIS — N6489 Other specified disorders of breast: Secondary | ICD-10-CM | POA: Diagnosis not present

## 2018-01-09 DIAGNOSIS — R922 Inconclusive mammogram: Secondary | ICD-10-CM | POA: Diagnosis not present

## 2018-01-15 ENCOUNTER — Ambulatory Visit (INDEPENDENT_AMBULATORY_CARE_PROVIDER_SITE_OTHER): Payer: Medicare Other | Admitting: Family Medicine

## 2018-01-15 ENCOUNTER — Encounter: Payer: Self-pay | Admitting: Family Medicine

## 2018-01-15 VITALS — BP 118/86 | HR 72 | Temp 97.9°F | Ht 66.0 in | Wt 154.6 lb

## 2018-01-15 DIAGNOSIS — J40 Bronchitis, not specified as acute or chronic: Secondary | ICD-10-CM

## 2018-01-15 DIAGNOSIS — J0100 Acute maxillary sinusitis, unspecified: Secondary | ICD-10-CM | POA: Diagnosis not present

## 2018-01-15 MED ORDER — ALBUTEROL SULFATE HFA 108 (90 BASE) MCG/ACT IN AERS: 1.0000 | INHALATION_SPRAY | Freq: Four times a day (QID) | RESPIRATORY_TRACT | 0 refills | Status: DC | PRN

## 2018-01-15 MED ORDER — BENZONATATE 100 MG PO CAPS: 100.0000 mg | ORAL_CAPSULE | Freq: Three times a day (TID) | ORAL | 0 refills | Status: DC | PRN

## 2018-01-15 MED ORDER — GUAIFENESIN-CODEINE 100-10 MG/5ML PO SYRP: 10.0000 mL | ORAL_SOLUTION | Freq: Three times a day (TID) | ORAL | 0 refills | Status: DC | PRN

## 2018-01-15 MED ORDER — AZITHROMYCIN 250 MG PO TABS: ORAL_TABLET | ORAL | 0 refills | Status: DC

## 2018-01-15 NOTE — Patient Instructions (Signed)

## 2018-01-15 NOTE — Progress Notes (Signed)
Patient: Amanda [name] MRN: [id number] DOB: 03-04-1951 PCP: Briscoe Deutscher, DO     Subjective:  Chief Complaint  Patient presents with  . Cough    symptoms x 2 wks  . URI    HPI: The patient is a 66 y.o. female who presents today for sinus congestion and cough for two weeks. She has had extreme pain in her sinuses, "clogged ears," and a lot of drainage. She states the pain has improved. The drainage is causing her to cough all night long. She has had no fever/chills. She can't really taste anything, but no pain in her teeth. She has used black seed oil, doterra essential oils, netty pot, cool mist humidifier. She denies any SOB, but does have some tightness/possibly wheezing. No chest pain and pain with exertion, she states this is with her breathing only. She has also tried sudafed and claritin, delsym for cough. No smoking, no asthma and not around second hand smoke. Denies any N/V/D.   Review of Systems  Constitutional: Positive for fatigue. Negative for chills and fever.  HENT: Positive for congestion, ear pain, postnasal drip, rhinorrhea and sinus pain. Negative for sinus pressure, sore throat, trouble swallowing and voice change.   Eyes: Negative for discharge, redness and itching.  Respiratory: Positive for cough and chest tightness. Negative for shortness of breath.   Cardiovascular: Negative for chest pain and palpitations.  Gastrointestinal: Negative for abdominal pain, diarrhea, nausea and vomiting.  Musculoskeletal:       Pt has fibromyalgia  Neurological: Positive for dizziness and headaches.  Psychiatric/Behavioral: Positive for sleep disturbance.    Allergies Patient is allergic to [employer] tartrate]; cortisone; eszopiclone and related; levofloxacin; dicloxacillin; dilantin [phenytoin sodium extended]; erythromycin; avelox [moxifloxacin hcl in nacl]; other; nitrofuran derivatives; phenytoin sodium extended; and trazodone and nefazodone.  Past Medical  History Patient  has a past medical history of Anxiety, Bruises easily, Celiac disease, Change in voice, Elevated cholesterol, GERD (gastroesophageal reflux disease), Hiatal hernia, Hyperlipidemia, Insomnia, Myalgia and myositis, unspecified, Osteoarthrosis, unspecified whether generalized or localized, unspecified site, Osteopenia (06/2015), Other abnormal blood chemistry, Other symptoms involving cardiovascular system, Reflux, Restless leg syndrome, STD (sexually transmitted disease), Subdural hematoma ([place]) (2010), Thyroid cancer ([place]), and Vertigo.  Surgical History Patient  has a past surgical history that includes Knee surgery (2002); Thyroid surgery; Abdominal hysterectomy (1986); Breast surgery (1999); Brain surgery (2010); NM MYOVIEW [facility]TD (November 2012); transthoracic echocardiogram ( April 2013; 01/18/2013); Colonoscopy; Esophagogastroduodenoscopy; Breast biopsy; and Reduction mammaplasty (Bilateral).  Family History Pateint's family history includes Breast cancer in her maternal aunt; Diabetes in her maternal aunt; Heart disease in her maternal grandfather; Hyperlipidemia in her maternal grandfather; Hypertension in her paternal grandfather; Liver cancer in her father.  Social History Patient  reports that she has never smoked. She has never used smokeless tobacco. She reports that she drinks about 2.0 - 3.0 standard drinks of alcohol per week. She reports that she does not use drugs.    Objective: Vitals:   01/15/18 1402  BP: 118/86  Pulse: 72  Temp: 97.9 F (36.6 C)  TempSrc: Oral  SpO2: 96%  Weight: 154 lb 9.6 oz (70.1 kg)  Height: 5' 6"  (1.676 m)    Body mass index is 24.95 kg/m.  Physical Exam  Constitutional: She is oriented to person, place, and time. She appears well-developed and well-nourished.  HENT:  Right Ear: External ear normal.  Left Ear: External ear normal.  Mouth/Throat: Oropharynx is clear and moist. No oropharyngeal exudate.  Right TM pearly  with light  reflex with some fluid that is clear behind it.  Left TM normal Nasal turbinate with mild to moderate edema. R>L No sinus TTP   Eyes: Pupils are equal, round, and reactive to light. EOM are normal.  Neck: Normal range of motion. Neck supple.  Cardiovascular: Normal rate, regular rhythm and normal heart sounds.  Pulmonary/Chest: Effort normal and breath sounds normal. No respiratory distress. She has no wheezes. She has no rales.  Abdominal: Soft. Bowel sounds are normal.  Lymphadenopathy:    She has no cervical adenopathy.  Neurological: She is alert and oriented to person, place, and time.  Skin: Skin is warm and dry. No rash noted.  Vitals reviewed.      Assessment/plan: 1. Acute non-recurrent maxillary sinusitis Improving and likely viral in nature. Continue her cool mist humidifier. Advised flonase which she has tolerated in the past. Giving her a zpack as well which should give her coverage of her sinuses since still hasn't "kicked this." allergy to PCN  2. Bronchitis Discussed cough can last for 6 weeks and to continue with conservative therapy with rest, fluids. Advised honey and continue with her cool mist humidifier. Adding on flonase. Already getting zpack so discussed lung pathogens are covered. Inhaler since she feels tight, but no wheezing heard on exam. Will also send in tessalon pearls for her to try to see if helps with cough. She has never had this before, so cautioned her since has so many adverse effects with drugs. Also sending in codeine cough syrup for her since she has tolerated well in past and works for her. Drowsy precautions given. Worsening symptoms please let us know.    Return if symptoms worsen or fail to improve.   Orma Flaming, MD [place]   01/15/2018

## 2018-01-17 ENCOUNTER — Encounter: Payer: Medicare Other | Admitting: Family Medicine

## 2018-01-17 ENCOUNTER — Ambulatory Visit: Payer: Medicare Other

## 2018-02-22 ENCOUNTER — Other Ambulatory Visit: Payer: Self-pay | Admitting: Cardiology

## 2018-03-06 ENCOUNTER — Ambulatory Visit: Payer: Medicare Other

## 2018-03-27 DIAGNOSIS — E041 Nontoxic single thyroid nodule: Secondary | ICD-10-CM | POA: Diagnosis not present

## 2018-03-29 ENCOUNTER — Other Ambulatory Visit: Payer: Self-pay | Admitting: Otolaryngology

## 2018-03-29 ENCOUNTER — Encounter: Payer: Self-pay | Admitting: Family Medicine

## 2018-03-29 DIAGNOSIS — I5189 Other ill-defined heart diseases: Secondary | ICD-10-CM | POA: Insufficient documentation

## 2018-03-29 DIAGNOSIS — E041 Nontoxic single thyroid nodule: Secondary | ICD-10-CM

## 2018-03-29 DIAGNOSIS — E7800 Pure hypercholesterolemia, unspecified: Secondary | ICD-10-CM | POA: Insufficient documentation

## 2018-03-29 DIAGNOSIS — N644 Mastodynia: Secondary | ICD-10-CM | POA: Insufficient documentation

## 2018-03-29 DIAGNOSIS — H1851 Endothelial corneal dystrophy: Secondary | ICD-10-CM

## 2018-03-29 DIAGNOSIS — E88819 Insulin resistance, unspecified: Secondary | ICD-10-CM | POA: Insufficient documentation

## 2018-03-29 DIAGNOSIS — E8881 Metabolic syndrome: Secondary | ICD-10-CM | POA: Insufficient documentation

## 2018-03-29 DIAGNOSIS — Z90710 Acquired absence of both cervix and uterus: Secondary | ICD-10-CM | POA: Insufficient documentation

## 2018-03-29 DIAGNOSIS — I519 Heart disease, unspecified: Secondary | ICD-10-CM | POA: Insufficient documentation

## 2018-03-29 DIAGNOSIS — H18519 Endothelial corneal dystrophy, unspecified eye: Secondary | ICD-10-CM | POA: Insufficient documentation

## 2018-03-29 DIAGNOSIS — M797 Fibromyalgia: Secondary | ICD-10-CM | POA: Insufficient documentation

## 2018-03-29 DIAGNOSIS — E538 Deficiency of other specified B group vitamins: Secondary | ICD-10-CM | POA: Insufficient documentation

## 2018-03-29 DIAGNOSIS — D649 Anemia, unspecified: Secondary | ICD-10-CM | POA: Insufficient documentation

## 2018-03-29 DIAGNOSIS — E559 Vitamin D deficiency, unspecified: Secondary | ICD-10-CM | POA: Insufficient documentation

## 2018-03-29 NOTE — Progress Notes (Signed)
Amanda [name] is a 67 y.o. female is here for follow up.  History of Present Illness:   Lonell Grandchild, CMA acting as scribe for Dr. Briscoe Deutscher.   HPI:   She has history of [place]ain [place] syndrome and vaccinations. She would like to get flu if she is able.   Chronic fatigue Working on sleep. She has only been sleeping about 5hrs a night. She takes the klonopin helps her go to sleep but she is having trouble staying asleep.   Fibromyalgia New diagnosis. Working on exercise, anti-inflammatory diet.   Restless leg syndrome REQUIP 0.25 MG tablet; Take 1 tablet (0.25 mg total) by mouth at bedtime. Started at last visit. She has noticed a big improvement with that.   There are no preventive care reminders to display for this patient. Depression screen [facility] 2/9 03/30/2018 03/30/2018 10/04/2017  Decreased Interest 0 0 2  Down, Depressed, Hopeless 0 0 1  PHQ - 2 Score 0 0 3  Altered sleeping 3 0 1  Tired, decreased energy 3 0 2  Change in appetite 0 0 2  Feeling bad or failure about yourself  0 0 0  Trouble concentrating 0 0 2  Moving slowly or fidgety/restless 0 0 0  Suicidal thoughts 0 0 0  PHQ-9 Score 6 0 10  Difficult doing work/chores Not difficult at all Not difficult at all Very difficult  Some recent data might be hidden   PMHx, SurgHx, SocialHx, FamHx, Medications, and Allergies were reviewed in the Visit Navigator and updated as appropriate.   Patient Active Problem List   Diagnosis Date Noted  . History of myasthenia gravis 04/01/2018  . Risk of myocardial infarction or stroke 7.5% or greater in next 10 years 03/30/2018  . Mastalgia, left, followed by GYN 03/29/2018  . Fibromyalgia 03/29/2018  . Fuchs' corneal dystrophy, 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  . Insulin resistance 03/29/2018  . History of hysterectomy 03/29/2018  . Grade I diastolic dysfunction 93/57/0177  . Thyroid nodule 03/27/2018  . Presbycusis of both ears 03/10/2017  . Dysthymia 09/26/2016   . Anxiety, prn Klonopin 08/03/2016  . Laryngopharyngeal reflux (LPR) 07/26/2016  . Thyroid cancer ([place])   . GERD (gastroesophageal reflux disease)   . Restless leg syndrome, on Requip   . Allergic rhinitis 06/25/2009  . Diaphragmatic hernia 02/16/2009  . IBS (irritable bowel syndrome) 02/16/2009  . Hyperlipidemia with target LDL less than 130, on Lipitor 09/26/2008  . Celiac disease 11/27/2007  . Sleep disorder 07/05/2007  . Osteoarthritis 07/05/2007  . Osteopenia 10/03/2006  . Left carotid bruit 10/03/2006  . History of thyroid cancer 10/03/2006   Social History   Tobacco Use  . Smoking status: Never Smoker  . Smokeless tobacco: Never Used  Substance Use Topics  . Alcohol use: Yes    Alcohol/week: 2.0 - 3.0 standard drinks    Types: 1 - 2 Cans of beer, 1 Standard drinks or equivalent per week  . Drug use: No   Current Medications and Allergies:   .  albuterol (PROAIR HFA) 108 (90 Base) MCG/ACT inhaler, Inhale 1 puff into the lungs every 6 (six) hours as needed for wheezing or shortness of breath., Disp: 1 Inhaler, Rfl: 0 .  APPLE CIDER VINEGAR PO, Take 1 capsule by mouth 2 (two) times daily. Reported on 06/09/2015, Disp: , Rfl:  .  atorvastatin (LIPITOR) 20 MG tablet, Take 1 tablet (20 mg total) by mouth every other day. NEED OV., Disp: 45 tablet, Rfl: 0 .  azithromycin (  ZITHROMAX) 250 MG tablet, 2 pills today and then 1 pill days 2-5. Hold cholesterol drug while on this., Disp: 6 tablet, Rfl: 0 .  benzonatate (TESSALON) 100 MG capsule, Take 1 capsule (100 mg total) by mouth 3 (three) times daily as needed for cough., Disp: 30 capsule, Rfl: 0 .  bisacodyl (DULCOLAX) 5 MG EC tablet, Take 5 mg by mouth daily as needed for moderate constipation. [name]  CALCIUM-VITAMIN D PO, Take 2 tablets by mouth daily., Disp: , Rfl:  .  clonazePAM (KLONOPIN) 1 MG tablet, Take 1 tablet (1 mg total) by mouth daily as needed for anxiety. .  CO ENZYME Q-10 PO, Take by mouth daily. , Disp: , Rfl:  .   escitalopram (LEXAPRO) 20 MG tablet, Take 1 tablet by mouth daily., Disp: , Rfl:  .  magnesium citrate SOLN, Take 1 Bottle by mouth as needed. , Disp: , Rfl:  .  metoprolol succinate (TOPROL-XL) 25 MG 24 hr tablet, TAKE 1 TABLET BY MOUTH IN THE EVENING MAY TAKE AN ADDITIONAL DOSE WITH ONSET OF PALPITATIONS, Disp: 60 tablet, Rfl: 6 .  Multiple Vitamin (MULTIVITAMIN) tablet, Take 1 tablet by mouth daily.  , Disp: , Rfl:  .  polyethylene glycol (MIRALAX / GLYCOLAX) packet, Take 17 g by mouth daily., Disp: , Rfl:  .  Propylene Glycol (SYSTANE BALANCE) 0.6 % SOLN, Apply 1 drop to eye 2 (two) times daily.  [name]  rOPINIRole (REQUIP) 0.25 MG tablet, Take 1 tablet (0.25 mg total) by mouth at bedtime., Disp: 90 tablet, Rfl:  .  valACYclovir (VALTREX) 500 MG tablet, Take 1 tablet (500 mg total) by mouth 2 (two) times daily., Disp: 10    Allergies  Allergen Reactions  . Ambien [Zolpidem Tartrate] Other (See Comments)    Causes Neurological problems with very bad dizziness, pains, disorientation  . Cortisone Other (See Comments)    REACTION: "mania" post ESI  . Eszopiclone And Related Other (See Comments)    Causes Neurological problems with very bad dizziness, pains, disorientation  . Levofloxacin Other (See Comments)    Muscle spasms and jerking movements  . Zolpidem Other (See Comments)    Causes Neurological problems with very bad dizziness, pains, disorientation  . Dicloxacillin Other (See Comments)    Stomach problems, swelling of tongue  . Dilantin [Phenytoin Sodium Extended] Hives and Itching  . Erythromycin Other (See Comments)    Tears stomach up  . Avelox [Moxifloxacin Hcl In Nacl]     Pt unsure of reaction.  . Other     Celiac disease  . Nitrofuran Derivatives Other (See Comments)    unknown  . Phenytoin Sodium Extended Rash  . Trazodone And Nefazodone Other (See Comments)    unknown   Review of Systems   Pertinent items are noted in the HPI. Otherwise, ROS is negative.  Vitals:     Vitals:   03/30/18 0751  BP: 128/70  Pulse: (!) 59  Temp: 98.2 F (36.8 C)  TempSrc: Oral  SpO2: 97%  Weight: 158 lb 3.2 oz (71.8 kg)  Height: 5' 6"  (1.676 m)     Body mass index is 25.53 kg/m.  Physical Exam:   Physical Exam  Constitutional: She appears well-nourished.  HENT:  Head: Normocephalic and atraumatic.  Eyes: Pupils are equal, round, and reactive to light. EOM are normal.  Neck: Normal range of motion. Neck supple.  Cardiovascular: Normal rate, regular rhythm, normal heart sounds and intact distal pulses.  Pulmonary/Chest: Effort normal.  Abdominal: Soft.  Musculoskeletal:  Legs: Skin: Skin is warm.  Psychiatric: She has a normal mood and affect. Her behavior is normal.  Nursing note and vitals reviewed.  Assessment and Plan:   Lalah was seen today for annual exam.  Diagnoses and all orders for this visit:  Routine physical examination  Hyperlipidemia with target LDL less than 130 -     Lipid panel -     Comp Met (CMET)  Fibromyalgia -     baclofen (LIORESAL) 20 MG tablet; Take 1 tablet (20 mg total) by mouth at bedtime as needed for muscle spasms.  Insulin resistance  Restless leg syndrome -     rOPINIRole (REQUIP) 0.25 MG tablet; Take 1 tablet (0.25 mg total) by mouth at bedtime.  Irritable bowel syndrome with constipation Comments: See AVS. Offered Linzess or Amitiza if not improving.   Primary insomnia -     clonazePAM (KLONOPIN) 1 MG tablet; Take 1 tablet (1 mg total) by mouth daily as needed for anxiety.  It band syndrome, right -     Ambulatory referral to Sports Medicine  History of thyroid cancer  Thyroid nodule  History of myasthenia gravis Comments: Discussed Hx of MG re: vaccinations. Recommended flu and PCV23. She will let me know later if she wants them.  HSV infection -     valACYclovir (VALTREX) 500 MG tablet; Take 1 tablet (500 mg total) by mouth 2 (two) times daily.  Other orders -     albuterol (PROAIR  HFA) 108 (90 Base) MCG/ACT inhaler; Inhale 1 puff into the lungs every 6 (six) hours as needed for wheezing or shortness of breath.  Patient Counseling: [x]    Nutrition: Stressed importance of moderation in sodium/caffeine intake, saturated fat and cholesterol, caloric balance, sufficient intake of fresh fruits, vegetables, fiber, calcium, iron, and 1 mg of folate supplement per day (for females capable of pregnancy).  [x]    Stressed the importance of regular exercise.   [x]    Substance Abuse: Discussed cessation/primary prevention of tobacco, alcohol, or other drug use; driving or other dangerous activities under the influence; availability of treatment for abuse.   [x]    Injury prevention: Discussed safety belts, safety helmets, smoke detector, smoking near bedding or upholstery.   [x]    Sexuality: Discussed sexually transmitted diseases, partner selection, use of condoms, avoidance of unintended pregnancy  and contraceptive alternatives.  [x]   Dental health: Discussed importance of regular tooth brushing, flossing, and dental visits.  [x]    Health maintenance and immunizations reviewed. Please refer to Health maintenance section.    . Reviewed expectations re: course of current medical issues. . Discussed self-management of symptoms. . Outlined signs and symptoms indicating need for more acute intervention. . Patient verbalized understanding and all questions were answered. [name] Health Maintenance issues including appropriate healthy diet, exercise, and smoking avoidance were discussed with patient. . See orders for this visit as documented in the electronic medical record. . Patient received an After Visit Summary.   Briscoe Deutscher, DO [place], [facility] 04/03/2018

## 2018-03-30 ENCOUNTER — Ambulatory Visit (INDEPENDENT_AMBULATORY_CARE_PROVIDER_SITE_OTHER): Payer: Medicare Other | Admitting: Family Medicine

## 2018-03-30 ENCOUNTER — Ambulatory Visit
Admission: RE | Admit: 2018-03-30 | Discharge: 2018-03-30 | Disposition: A | Discharge status: Home / Self Care | Payer: Medicare Other | Source: Ambulatory Visit | Attending: Otolaryngology | Admitting: Otolaryngology

## 2018-03-30 ENCOUNTER — Ambulatory Visit (INDEPENDENT_AMBULATORY_CARE_PROVIDER_SITE_OTHER): Payer: Medicare Other | Admitting: Sports Medicine

## 2018-03-30 ENCOUNTER — Encounter: Payer: Self-pay | Admitting: Family Medicine

## 2018-03-30 VITALS — BP 128/70 | HR 59 | Ht 66.0 in | Wt 158.0 lb

## 2018-03-30 VITALS — BP 128/70 | HR 59 | Temp 98.2°F | Ht 66.0 in | Wt 158.2 lb

## 2018-03-30 DIAGNOSIS — M9906 Segmental and somatic dysfunction of lower extremity: Secondary | ICD-10-CM

## 2018-03-30 DIAGNOSIS — M9902 Segmental and somatic dysfunction of thoracic region: Secondary | ICD-10-CM

## 2018-03-30 DIAGNOSIS — Z8669 Personal history of other diseases of the nervous system and sense organs: Secondary | ICD-10-CM | POA: Diagnosis not present

## 2018-03-30 DIAGNOSIS — E88819 Insulin resistance, unspecified: Secondary | ICD-10-CM

## 2018-03-30 DIAGNOSIS — M9908 Segmental and somatic dysfunction of rib cage: Secondary | ICD-10-CM | POA: Diagnosis not present

## 2018-03-30 DIAGNOSIS — E049 Nontoxic goiter, unspecified: Secondary | ICD-10-CM | POA: Diagnosis not present

## 2018-03-30 DIAGNOSIS — E041 Nontoxic single thyroid nodule: Secondary | ICD-10-CM

## 2018-03-30 DIAGNOSIS — M797 Fibromyalgia: Secondary | ICD-10-CM | POA: Diagnosis not present

## 2018-03-30 DIAGNOSIS — M9904 Segmental and somatic dysfunction of sacral region: Secondary | ICD-10-CM

## 2018-03-30 DIAGNOSIS — K581 Irritable bowel syndrome with constipation: Secondary | ICD-10-CM | POA: Diagnosis not present

## 2018-03-30 DIAGNOSIS — F5101 Primary insomnia: Secondary | ICD-10-CM

## 2018-03-30 DIAGNOSIS — E8881 Metabolic syndrome: Secondary | ICD-10-CM | POA: Diagnosis not present

## 2018-03-30 DIAGNOSIS — M7631 Iliotibial band syndrome, right leg: Secondary | ICD-10-CM

## 2018-03-30 DIAGNOSIS — M9903 Segmental and somatic dysfunction of lumbar region: Secondary | ICD-10-CM | POA: Diagnosis not present

## 2018-03-30 DIAGNOSIS — M9905 Segmental and somatic dysfunction of pelvic region: Secondary | ICD-10-CM

## 2018-03-30 DIAGNOSIS — E785 Hyperlipidemia, unspecified: Secondary | ICD-10-CM

## 2018-03-30 DIAGNOSIS — B009 Herpesviral infection, unspecified: Secondary | ICD-10-CM | POA: Diagnosis not present

## 2018-03-30 DIAGNOSIS — Z85850 Personal history of malignant neoplasm of thyroid: Secondary | ICD-10-CM

## 2018-03-30 DIAGNOSIS — G2581 Restless legs syndrome: Secondary | ICD-10-CM | POA: Diagnosis not present

## 2018-03-30 DIAGNOSIS — Z0000 Encounter for general adult medical examination without abnormal findings: Secondary | ICD-10-CM | POA: Diagnosis not present

## 2018-03-30 DIAGNOSIS — Z9189 Other specified personal risk factors, not elsewhere classified: Secondary | ICD-10-CM | POA: Insufficient documentation

## 2018-03-30 DIAGNOSIS — E7800 Pure hypercholesterolemia, unspecified: Secondary | ICD-10-CM | POA: Insufficient documentation

## 2018-03-30 LAB — COMPREHENSIVE METABOLIC PANEL
ALT: 20 U/L (ref 0–35)
AST: 26 U/L (ref 0–37)
Albumin: 4.6 g/dL (ref 3.5–5.2)
Alkaline Phosphatase: 76 U/L (ref 39–117)
BUN: 15 mg/dL (ref 6–23)
CO2: 29 mEq/L (ref 19–32)
Calcium: 9.5 mg/dL (ref 8.4–10.5)
Chloride: 105 mEq/L (ref 96–112)
Creatinine, Ser: 0.89 mg/dL (ref 0.40–1.20)
GFR: 67.05 mL/min (ref >60.00)
Glucose, Bld: 101 mg/dL — ABNORMAL HIGH (ref 70–99)
Potassium: 4.6 mEq/L (ref 3.5–5.1)
Sodium: 141 mEq/L (ref 135–145)
Total Bilirubin: 0.6 mg/dL (ref 0.2–1.2)
Total Protein: 7.4 g/dL (ref 6.0–8.3)

## 2018-03-30 LAB — LIPID PANEL
Cholesterol: 219 mg/dL — ABNORMAL HIGH (ref 0–200)
HDL: 73.2 mg/dL (ref >39.00)
LDL Cholesterol: 131 mg/dL — ABNORMAL HIGH (ref 0–99)
NonHDL: 146.03
Total CHOL/HDL Ratio: 3
Triglycerides: 76 mg/dL (ref 0.0–149.0)
VLDL: 15.2 mg/dL (ref 0.0–40.0)

## 2018-03-30 MED ORDER — CLONAZEPAM 1 MG PO TABS: 1.0000 mg | ORAL_TABLET | Freq: Every day | ORAL | 2 refills | Status: DC | PRN

## 2018-03-30 MED ORDER — DICLOFENAC SODIUM 1 % TD GEL
TRANSDERMAL | 1 refills | Status: DC
Start: 2018-03-30 — End: 2018-05-28

## 2018-03-30 MED ORDER — VALACYCLOVIR HCL 500 MG PO TABS: 500.0000 mg | ORAL_TABLET | Freq: Two times a day (BID) | ORAL | 0 refills | Status: DC

## 2018-03-30 MED ORDER — BACLOFEN 20 MG PO TABS: 20.0000 mg | ORAL_TABLET | Freq: Every evening | ORAL | 0 refills | Status: DC | PRN

## 2018-03-30 MED ORDER — ALBUTEROL SULFATE HFA 108 (90 BASE) MCG/ACT IN AERS: 1.0000 | INHALATION_SPRAY | Freq: Four times a day (QID) | RESPIRATORY_TRACT | 0 refills | Status: DC | PRN

## 2018-03-30 MED ORDER — ROPINIROLE HCL 0.25 MG PO TABS: 0.2500 mg | ORAL_TABLET | Freq: Every day | ORAL | 2 refills | Status: DC

## 2018-03-30 NOTE — Progress Notes (Signed)
PROCEDURE NOTE : OSTEOPATHIC MANIPULATION The decision today to treat with Osteopathic Manipulative Therapy (OMT) was based on physical exam findings. Verbal consent was obtained following a discussion with the patient regarding the of risks, benefits and potential side effects, including an acute pain flare,post manipulation soreness and need for repeat treatments.     Contraindications to OMT: NONE  Manipulation was performed as below: Regions Treated OMT Techniques Used  1. Thoracic spine 2. Ribs 3. Lumbar spine 4. Pelvis 5. Sacrum 6. Lower extremities . HVLA . muscle energy . myofascial release   The patient tolerated the treatment well and reported Improved symptoms following treatment today. Patient was given medications, exercises, stretches and lifestyle modifications per AVS and verbally.   OSTEOPATHIC/STRUCTURAL EXAM:   T2 extended side bent right T4 through T6 rotated left L3 FRS right Left on left sacral torsion Right anterior innominate Posterior ribs 7 right Externally rotated right hip

## 2018-03-30 NOTE — Patient Instructions (Signed)
MANAGEMENT OF CHRONIC CONSTIPATION   Push fluids, all day, everyday  Eat lots of high fiber foods-fruits, veggies, bran and whole grain instead of white bread  Be active everyday. Inactivity makes constipation worse.  Add psyllium daily (Metamucil) which comes in capsules now. Start very low dose and work up to recommended dose on bottle daily.  If that is not working, I would start Miralax, which you can buy in generic 17 gms daily. It's a powder and not an "addictive laxative". Take it every day and titrate the dose up or down to get the daily BM.  You may also try "SMOOTH MOVE TEA."

## 2018-03-30 NOTE — Progress Notes (Signed)
PROCEDURE NOTE: THERAPEUTIC EXERCISES (97110) 15 minutes spent for Therapeutic exercises as below and as referenced in the AVS.  This included exercises focusing on stretching, strengthening, with significant focus on eccentric aspects.   Proper technique shown and discussed handout in great detail with ATC.  All questions were discussed and answered.   Long term goals include an improvement in range of motion, strength, endurance as well as avoiding reinjury. Frequency of visits is one time as determined during today's  office visit. Frequency of exercises to be performed is as per handout.  EXERCISES REVIEWED:  Amanda [name] Exercises  Pelvic recruitment/tilting  Hip ABduction strengthening with focus on Glute Medius Recruitment  Hip flexor stretching

## 2018-03-30 NOTE — Patient Instructions (Addendum)
Also check out [employer]" which is a program developed by Dr. Minerva Ends.   There are links to a couple of his YouTube Videos below and I would like to see performing one of his videos 5-6 days per week.    A good intro video is: "Independence from Pain 7-minute Video" - [email or web address]   Exercises that focus more on the neck are as below: Dr. Archie Balboa with [place] teaching neck and shoulder details Part 1 - [email or web address] Part 2 Dr. Archie Balboa with [facility] quick routine to practice daily - [email or web address].[email or web address]  Do not try to attempt the entire video when first beginning.    Try breaking of each exercise that he goes into shorter segments.  Otherwise if they perform an exercise for 45 seconds, start with 15 seconds and rest and then resume when they begin the new activity.  If you work your way up to being able to do these videos without having to stop, I expect you will see significant improvements in your pain.  If you enjoy his videos and would like to find out more you can look on his website: [email or web address]  He has a workout streaming option as well as a DVD set available for purchase.  Amazon has the best price for his DVDs.     Please perform the exercise program that we have prepared for you and gone over in detail on a daily basis.  In addition to the handout you were provided you can access your program through: [email or web address]   Your unique program code is: [email or web address]

## 2018-03-30 NOTE — Progress Notes (Signed)
Amanda [name]. , [place] [facility] [place]  Amanda [name] - 67 y.o. female MRN [id number]  Date of birth: Sep 01, 1950  Visit Date: 03/30/2018  PCP: Briscoe Deutscher, DO   Referred by: Briscoe Deutscher, DO   Scribe(s) for today's visit: Wendy Poet, LAT, ATC  SUBJECTIVE:  Amanda [name] is here for New Patient (Initial Visit) (R ITB)  Referred by: Dr. [name]  HPI: Her R greater trochanter and ITB symptoms INITIALLY: Began about 4-5 months ago w/ no known MOI.  She has already been seen at [facility] and has been to PT.  Her pain is localized to the R greater trochanter. Described as moderate-severe aching pain, nonradiating but does note TTP along the R ITB. Worsened with transitions from sit-to-stand; walking Improved with rest Additional associated symptoms include: no N/T noted in her R LE; reports intermittent popping in her R ant hip w/ ecc SLR; reports intermittent low back pain    At this time symptoms are worsening compared to onset. She has been doing a HEP as prescribed by PT at [facility].  REVIEW OF SYSTEMS: Reports night time disturbances. Denies fevers, chills, or night sweats. Denies unexplained weight loss. Reports personal history of cancer.  L thyroid gland in 2007. Denies changes in bowel or bladder habits. Denies recent unreported falls. Denies new or worsening dyspnea or wheezing. Reports headaches or dizziness. Intermittent dizziness due to some issues w/ her occipital nerve. Denies numbness, tingling or weakness  In the extremities.  Denies dizziness or presyncopal episodes Denies lower extremity edema    HISTORY:  Prior history reviewed and updated per electronic medical record.  Social History   Occupational History  . Not on file  Tobacco Use  . Smoking status: Never Smoker  . Smokeless tobacco: Never Used  Substance and Sexual Activity  . Alcohol use: Yes    Alcohol/week: 2.0  - 3.0 standard drinks    Types: 1 - 2 Cans of beer, 1 Standard drinks or equivalent per week  . Drug use: No  . Sexual activity: Yes    Partners: Male    Birth control/protection: Surgical    Comment: HYST-1st intercourse 67 yo-Fewer than 5 partners   Social History   Social History Narrative   Married mother of 2 with no grandchildren. Never smoked. Lives with husband in a three story home, no issue with stairs. Highest level of education is an associates. She exercises regularly, leads an active lifestyle, has a healthy diet, and is very socially engaged. Self-described "HSP: highly sensitive person" and therefore tends to worry more about symptoms than others might.    DATA OBTAINED & REVIEWED:   Recent Labs    10/04/17 0922 03/30/18 0901  HGBA1C 5.7  --   CALCIUM 9.7  10.1 9.5  AST 20 26  ALT 16 20  TSH 2.51  --    No problems updated.  OBJECTIVE:  VS:  HT:5' 6"  (167.6 cm)   WT:158 lb (71.7 kg)  BMI:25.51    BP:128/70  HR:(!) 59bpm  TEMP: ( )  RESP:97 %   PHYSICAL EXAM: Adult female.  No acute distress.  Alert and appropriate. Good lumbar range of motion although there are functional limitations. Negative straight leg raises bilaterally Lower extremity strength is 5/5 in all myotomes. Normal sensation Significant anterior chain dominant posture. IT band is tender to palpation.   ASSESSMENT   1. It band syndrome, right   2. Fibromyalgia  3. Somatic dysfunction of lower extremity   4. Somatic dysfunction of thoracic region   5. Somatic dysfunction of lumbar region   6. Somatic dysfunction of rib cage region   7. Somatic dysfunction of pelvis region   8. Somatic dysfunction of sacral region      PROCEDURES:  Osteopathic manipulation was performed today based on physical exam findings.  Please see procedure note for further information including Osteopathic Exam findings   Home Therapeutic exercises prescribed per procedure note.      PLAN:    Pertinent additional documentation may be included in corresponding procedure notes, imaging studies, problem based documentation and patient instructions.  No problem-specific Assessment & Plan notes found for this encounter.  Fairly classic IT band symptoms.  Should respond well to conservative measures  Links to [employer] provided today per Patient Instructions.  These exercises were developed by Minerva Ends, DC with a strong emphasis on core neuromuscular reducation and postural realignment through body-weight exercises.  and Home Therapeutic exercises prescribed today per procedure note.  Discussed the underlying features of tight hip flexors leading to crouched, fetal like position that results in spinal column compression.  Including lumbar hyperflexion with hypermobility, thoracic flexion with restrictive rotation and cervical lordosis reversal.   Osteopathic manipulation was performed today based on physical exam findings.  Patient was counseled on the purpose and expected outcome of osteopathic manipulation and understands that a single treatment may not provide permanent long lasting relief.  They understand that home therapeutic exercises are critical part of the healing/treatment process and will continue with self treatment between now and their next visit as outlined.  The patient understands that the frequency of visits is meant to provide a stimulus to promote the body's own ability to heal and is not meant to be the sole means for improvement in their symptoms. Activity modifications and the importance of avoiding exacerbating activities (limiting pain to no more than a 4 / 10 during or following activity) recommended and discussed.  Discussed red flag symptoms that warrant earlier emergent evaluation and patient voices understanding.  Trial of topical diclofenac for anti-inflammatory component over hip.  Return in about 3 weeks (around 04/20/2018).      CMA/ATC served as Education administrator during this visit. History, Physical, and Plan performed by medical provider. Documentation and orders reviewed and attested to.      Gerda Diss, [facility] Physician

## 2018-03-31 ENCOUNTER — Other Ambulatory Visit: Payer: Self-pay | Admitting: Cardiology

## 2018-04-01 DIAGNOSIS — Z8669 Personal history of other diseases of the nervous system and sense organs: Secondary | ICD-10-CM | POA: Insufficient documentation

## 2018-04-03 ENCOUNTER — Other Ambulatory Visit: Payer: Medicare Other

## 2018-04-10 ENCOUNTER — Encounter: Payer: Self-pay | Admitting: Physician Assistant

## 2018-04-10 ENCOUNTER — Ambulatory Visit (INDEPENDENT_AMBULATORY_CARE_PROVIDER_SITE_OTHER): Payer: Medicare Other | Admitting: Physician Assistant

## 2018-04-10 ENCOUNTER — Ambulatory Visit (INDEPENDENT_AMBULATORY_CARE_PROVIDER_SITE_OTHER): Payer: Medicare Other

## 2018-04-10 VITALS — BP 140/80 | HR 75 | Temp 98.9°F | Ht 66.0 in | Wt 158.2 lb

## 2018-04-10 DIAGNOSIS — R05 Cough: Secondary | ICD-10-CM | POA: Diagnosis not present

## 2018-04-10 DIAGNOSIS — R059 Cough, unspecified: Secondary | ICD-10-CM

## 2018-04-10 MED ORDER — DOXYCYCLINE HYCLATE 100 MG PO TABS: 100.0000 mg | ORAL_TABLET | Freq: Two times a day (BID) | ORAL | 0 refills | Status: DC

## 2018-04-10 MED ORDER — HYDROCOD POLST-CPM POLST ER 10-8 MG/5ML PO SUER: 5.0000 mL | Freq: Every evening | ORAL | 0 refills | Status: DC | PRN

## 2018-04-10 NOTE — Progress Notes (Signed)
Amanda [name] is a 67 y.o. female here for a new problem.  I acted as a Education administrator for [employer], PA-C Anselmo Pickler, LPN   History of Present Illness:   Chief Complaint  Patient presents with  . Cough    Cough  This is a new problem. Episode onset: Started 4-5 days ago and her husband recently had Pneumonia and pt would like chest x-ray. The cough is productive of sputum (Thick sputum). Associated symptoms include nasal congestion, postnasal drip and wheezing. Pertinent negatives include no chills, ear congestion, ear pain, fever, headaches, sore throat or shortness of breath. The symptoms are aggravated by lying down. She has tried prescription cough suppressant (Sudafed) for the symptoms. The treatment provided mild relief. Her past medical history is significant for asthma (Hx of exercise induced) and bronchitis. There is no history of pneumonia.      Past Medical History:  Diagnosis Date  . Anxiety   . Celiac disease   . GERD (gastroesophageal reflux disease)   . Hiatal hernia   . Hyperlipidemia   . Insomnia   . Osteoarthrosis   . Osteopenia, T score -2.1 FRAX 9.4%/1.3% stable from prior DEXA 06/2015  . Restless leg syndrome   . STD (sexually transmitted disease), HSV   . 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 Thyroid cancer ([place])   . Vertigo      Social History   Socioeconomic History  . Marital status: Married    Spouse name: Not on file  . Number of children: 2  . Years of education: Not on file  . Highest education level: Not on file  Occupational History  . Not on file  Social Needs  . Financial resource strain: Not on file  . Food insecurity:    Worry: Not on file    Inability: Not on file  . Transportation needs:    Medical: Not on file    Non-medical: Not on file  Tobacco Use  . Smoking status: Never Smoker  . Smokeless tobacco: Never Used  Substance and Sexual Activity  . Alcohol use: Yes    Alcohol/week:  2.0 - 3.0 standard drinks    Types: 1 - 2 Cans of beer, 1 Standard drinks or equivalent per week  . Drug use: No  . Sexual activity: Yes    Partners: Male    Birth control/protection: Surgical    Comment: HYST-1st intercourse 67 yo-Fewer than 5 partners  Lifestyle  . Physical activity:    Days per week: Not on file    Minutes per session: Not on file  . Stress: Not on file  Relationships  . Social connections:    Talks on phone: Not on file    Gets together: Not on file    Attends religious service: Not on file    Active member of club or organization: Not on file    Attends meetings of clubs or organizations: Not on file    Relationship status: Not on file  . Intimate partner violence:    Fear of current or ex partner: Not on file    Emotionally abused: Not on file    Physically abused: Not on file    Forced sexual activity: Not on file  Other Topics Concern  . Not on file  Social History Narrative   Married mother of 2 with no grandchildren. Never smoked. Lives with husband in a three story home, no issue with stairs. Highest level of education is an  associates. She exercises regularly, leads an active lifestyle, has a healthy diet, and is very socially engaged. Self-described "HSP: highly sensitive person" and therefore tends to worry more about symptoms than others might.    Past Surgical History:  Procedure Laterality Date  . ABDOMINAL HYSTERECTOMY  1986  . BREAST BIOPSY    . BREAST REDUCTION SURGERY  1999  . COLONOSCOPY    . ESOPHAGOGASTRODUODENOSCOPY    . KNEE SURGERY Bilateral 2002  . REDUCTION MAMMAPLASTY Bilateral   . SUBDURAL HEMATOMA EVACUATION VIA CRANIOTOMY  2010  . THYROIDECTOMY, PARTIAL      Family History  Problem Relation Age of Onset  . Diabetes Maternal Aunt   . Breast cancer Maternal Aunt 60  . Liver cancer Father   . Heart disease Maternal Grandfather   . Hyperlipidemia Maternal Grandfather   . Hypertension Paternal Grandfather   . Colon  cancer Neg Hx     Allergies  Allergen Reactions  . Ambien [Zolpidem Tartrate] Other (See Comments)    Causes Neurological problems with very bad dizziness, pains, disorientation  . Cortisone Other (See Comments)    REACTION: "mania" post ESI  . Eszopiclone And Related Other (See Comments)    Causes Neurological problems with very bad dizziness, pains, disorientation  . Levofloxacin Other (See Comments)    Muscle spasms and jerking movements  . Zolpidem Other (See Comments)    Causes Neurological problems with very bad dizziness, pains, disorientation  . Dicloxacillin Other (See Comments)    Stomach problems, swelling of tongue  . Dilantin [Phenytoin Sodium Extended] Hives and Itching  . Erythromycin Other (See Comments)    Tears stomach up  . Avelox [Moxifloxacin Hcl In Nacl]     Pt unsure of reaction.  . Other     Celiac disease  . Nitrofuran Derivatives Other (See Comments)    unknown  . Phenytoin Sodium Extended Rash  . Trazodone And Nefazodone Other (See Comments)    unknown    Current Medications:   Current Outpatient Medications:  .  APPLE CIDER VINEGAR PO, Take 1 capsule by mouth 2 (two) times daily. Reported on 06/09/2015, Disp: , Rfl:  .  atorvastatin (LIPITOR) 20 MG tablet, Take 1 tablet (20 mg total) by mouth every other day. NEED OV., Disp: 45 tablet, Rfl: 0 .  baclofen (LIORESAL) 20 MG tablet, Take 1 tablet (20 mg total) by mouth at bedtime as needed for muscle spasms., Disp: 30 each, Rfl: 0 .  bisacodyl (DULCOLAX) 5 MG EC tablet, Take 5 mg by mouth daily as needed for moderate constipation., Disp: , Rfl:  .  CALCIUM-VITAMIN D PO, Take 2 tablets by mouth daily., Disp: , Rfl:  .  clonazePAM (KLONOPIN) 1 MG tablet, Take 1 tablet (1 mg total) by mouth daily as needed for anxiety., Disp: 30 tablet, Rfl: 2 .  CO ENZYME Q-10 PO, Take by mouth daily. , Disp: , Rfl:  .  diclofenac sodium (VOLTAREN) 1 % GEL, Apply topically to affected area qid, Disp: 100 g, Rfl: 1 .   escitalopram (LEXAPRO) 20 MG tablet, Take 1 tablet by mouth daily., Disp: , Rfl:  .  magnesium citrate SOLN, Take 1 Bottle by mouth as needed. , Disp: , Rfl:  .  metoprolol succinate (TOPROL-XL) 25 MG 24 hr tablet, Take 1-2 tablets (25-50 mg total) by mouth daily. NEED OV., Disp: 180 tablet, Rfl: 0 .  Multiple Vitamin (MULTIVITAMIN) tablet, Take 1 tablet by mouth daily.  , Disp: , Rfl:  .  polyethylene  glycol (MIRALAX / GLYCOLAX) packet, Take 17 g by mouth daily., Disp: , Rfl:  .  Propylene Glycol (SYSTANE BALANCE) 0.6 % SOLN, Apply 1 drop to eye 2 (two) times daily. For dry eyes, takes along with restasis, Disp: , Rfl:  .  rOPINIRole (REQUIP) 0.25 MG tablet, Take 1 tablet (0.25 mg total) by mouth at bedtime., Disp: 90 tablet, Rfl: 2 .  valACYclovir (VALTREX) 500 MG tablet, Take 1 tablet (500 mg total) by mouth 2 (two) times daily., Disp: 10 tablet, Rfl: 0 .  albuterol (PROAIR HFA) 108 (90 Base) MCG/ACT inhaler, Inhale 1 puff into the lungs every 6 (six) hours as needed for wheezing or shortness of breath. (Patient not taking: Reported on 04/10/2018), Disp: 1 Inhaler, Rfl: 0 .  chlorpheniramine-HYDROcodone (TUSSIONEX PENNKINETIC ER) 10-8 MG/5ML SUER, Take 5 mLs by mouth at bedtime as needed for cough., Disp: 140 mL, Rfl: 0 .  doxycycline (VIBRA-TABS) 100 MG tablet, Take 1 tablet (100 mg total) by mouth 2 (two) times daily., Disp: 20 tablet, Rfl: 0   Review of Systems:   Review of Systems  Constitutional: Negative for chills and fever.  HENT: Positive for postnasal drip. Negative for ear pain and sore throat.   Respiratory: Positive for cough and wheezing. Negative for shortness of breath.   Neurological: Negative for headaches.    Vitals:   Vitals:   04/10/18 1250  BP: 140/80  Pulse: 75  Temp: 98.9 F (37.2 C)  TempSrc: Oral  SpO2: 98%  Weight: 158 lb 4 oz (71.8 kg)  Height: 5' 6"  (1.676 m)     Body mass index is 25.54 kg/m.  Physical Exam:   Physical Exam  Constitutional: She  appears well-developed. She is cooperative.  Non-toxic appearance. She does not have a sickly appearance. She does not appear ill. No distress.  HENT:  Head: Normocephalic and atraumatic.  Right Ear: Tympanic membrane, external ear and ear canal normal. Tympanic membrane is not erythematous, not retracted and not bulging.  Left Ear: Tympanic membrane, external ear and ear canal normal. Tympanic membrane is not erythematous, not retracted and not bulging.  Nose: Mucosal edema and rhinorrhea present. Right sinus exhibits no maxillary sinus tenderness and no frontal sinus tenderness. Left sinus exhibits no maxillary sinus tenderness and no frontal sinus tenderness.  Mouth/Throat: Uvula is midline. Posterior oropharyngeal erythema present. No posterior oropharyngeal edema. Tonsils are 0 on the right. Tonsils are 0 on the left.  Eyes: Conjunctivae and lids are normal.  Neck: Trachea normal.  Cardiovascular: Normal rate, regular rhythm, S1 normal, S2 normal and normal heart sounds.  Pulmonary/Chest: Effort normal and breath sounds normal. She has no decreased breath sounds. She has no wheezes. She has no rhonchi. She has no rales.  Lungs CTA bilaterally  Lymphadenopathy:    She has no cervical adenopathy.  Neurological: She is alert.  Skin: Skin is warm, dry and intact.  Psychiatric: She has a normal mood and affect. Her speech is normal and behavior is normal.  Nursing note and vitals reviewed.  CXR -- official read pending, no infiltrate based on my read  Assessment and Plan:   Blaize was seen today for cough.  Diagnoses and all orders for this visit:  Cough -     DG Chest 2 View; Future  Other orders -     doxycycline (VIBRA-TABS) 100 MG tablet; Take 1 tablet (100 mg total) by mouth 2 (two) times daily. -     chlorpheniramine-HYDROcodone (TUSSIONEX PENNKINETIC ER) 10-8 MG/5ML SUER;  Take 5 mLs by mouth at bedtime as needed for cough.   No red flags on exam.  Awaiting official CXR  read. Tussionex given for cough. Safety net of doxycycline given. Discussed taking medications as prescribed. Reviewed return precautions including worsening fever, SOB, worsening cough or other concerns. Push fluids and rest. I recommend that patient follow-up if symptoms worsen or persist despite treatment x 7-10 days, sooner if needed.  . Reviewed expectations re: course of current medical issues. . Discussed self-management of symptoms. . Outlined signs and symptoms indicating need for more acute intervention. . Patient verbalized understanding and all questions were answered. . See orders for this visit as documented in the electronic medical record. . Patient received an After-Visit Summary.  CMA or LPN served as scribe during this visit. History, Physical, and Plan performed by medical provider. The above documentation has been reviewed and is accurate and complete.   Inda Coke, PA-C

## 2018-04-10 NOTE — Patient Instructions (Signed)
It was great to see you!  We will be in touch regarding your xray.  Push fluids and get plenty of rest. Please return if you are not improving as expected, or if you have high fevers (>101.5) or difficulty swallowing or worsening productive cough.  Call clinic with questions.  I hope you start feeling better soon!

## 2018-04-17 ENCOUNTER — Ambulatory Visit (INDEPENDENT_AMBULATORY_CARE_PROVIDER_SITE_OTHER): Payer: Medicare Other | Admitting: Family Medicine

## 2018-04-17 ENCOUNTER — Encounter: Payer: Self-pay | Admitting: Family Medicine

## 2018-04-17 VITALS — BP 112/64 | HR 67 | Temp 98.1°F | Ht 66.0 in | Wt 162.0 lb

## 2018-04-17 DIAGNOSIS — R05 Cough: Secondary | ICD-10-CM

## 2018-04-17 DIAGNOSIS — R058 Other specified cough: Secondary | ICD-10-CM

## 2018-04-17 DIAGNOSIS — Z23 Encounter for immunization: Secondary | ICD-10-CM | POA: Diagnosis not present

## 2018-04-17 NOTE — Progress Notes (Signed)
Amanda [name] is a 67 y.o. female is here for follow up.  History of Present Illness:   HPI: Recent URI and cough. Has newborn grandchild that she would like to visit and needs to make sure that she is safe to be around. No fevers, chills, bodyaches, headaches, sinus pain, productive cough, wheeze, CP, SOB.   There are no preventive care reminders to display for this patient.   Depression screen [facility] 2/9 03/30/2018 03/30/2018 10/04/2017  Decreased Interest 0 0 2  Down, Depressed, Hopeless 0 0 1  PHQ - 2 Score 0 0 3  Altered sleeping 3 0 1  Tired, decreased energy 3 0 2  Change in appetite 0 0 2  Feeling bad or failure about yourself  0 0 0  Trouble concentrating 0 0 2  Moving slowly or fidgety/restless 0 0 0  Suicidal thoughts 0 0 0  PHQ-9 Score 6 0 10  Difficult doing work/chores Not difficult at all Not difficult at all Very difficult  Some recent data might be hidden   PMHx, SurgHx, SocialHx, FamHx, Medications, and Allergies were reviewed in the Visit Navigator and updated as appropriate.   Patient Active Problem List   Diagnosis Date Noted  . History of myasthenia gravis 04/01/2018  . Risk of myocardial infarction or stroke 7.5% or greater in next 10 years 03/30/2018  . Mastalgia, left, followed by GYN 03/29/2018  . Fibromyalgia 03/29/2018  . Fuchs' corneal dystrophy, 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  . Insulin resistance 03/29/2018  . History of hysterectomy 03/29/2018  . Grade I diastolic dysfunction 05/11/7251  . Thyroid nodule 03/27/2018  . Presbycusis of both ears 03/10/2017  . Dysthymia 09/26/2016  . Anxiety, prn Klonopin 08/03/2016  . Laryngopharyngeal reflux (LPR) 07/26/2016  . Thyroid cancer ([place])   . GERD (gastroesophageal reflux disease)   . Restless leg syndrome, on Requip   . Allergic rhinitis 06/25/2009  . Diaphragmatic hernia 02/16/2009  . IBS (irritable bowel syndrome) 02/16/2009  . Hyperlipidemia with target LDL less than 130, on Lipitor  09/26/2008  . Celiac disease 11/27/2007  . Sleep disorder 07/05/2007  . Osteoarthritis 07/05/2007  . Osteopenia 10/03/2006  . Left carotid bruit 10/03/2006  . History of thyroid cancer 10/03/2006   Social History   Tobacco Use  . Smoking status: Never Smoker  . Smokeless tobacco: Never Used  Substance Use Topics  . Alcohol use: Yes    Alcohol/week: 2.0 - 3.0 standard drinks    Types: 1 - 2 Cans of beer, 1 Standard drinks or equivalent per week  . Drug use: No   Current Medications and Allergies:   .  albuterol (PROAIR HFA) 108 (90 Base) MCG/ACT inhaler, Inhale 1 puff into the lungs every 6 (six) hours as needed for wheezing or shortness of breath. (Patient not taking: Reported on 04/10/2018), Disp: 1 Inhaler, Rfl: 0 .  APPLE CIDER VINEGAR PO, Take 1 capsule by mouth 2 (two) times daily. Reported on 06/09/2015, Disp: , Rfl:  .  atorvastatin (LIPITOR) 20 MG tablet, Take 1 tablet (20 mg total) by mouth every other day. NEED OV., Disp: 45 tablet, Rfl: 0 .  baclofen (LIORESAL) 20 MG tablet, Take 1 tablet (20 mg total) by mouth at bedtime as needed for muscle spasms., Disp: 30 each, Rfl: 0 .  bisacodyl (DULCOLAX) 5 MG EC tablet, Take 5 mg by mouth daily as needed for moderate constipation., Disp: , Rfl:  .  CALCIUM-VITAMIN D PO, Take 2 tablets by mouth daily., Disp: , Rfl:  .  chlorpheniramine-HYDROcodone (TUSSIONEX PENNKINETIC ER) 10-8 MG/5ML SUER, Take 5 mLs by mouth at bedtime as needed for cough., Disp: 140 mL, Rfl: 0 .  clonazePAM (KLONOPIN) 1 MG tablet, Take 1 tablet (1 mg total) by mouth daily as needed for anxiety., Disp: 30 tablet, Rfl: 2 .  CO ENZYME Q-10 PO, Take by mouth daily. , Disp: , Rfl:  .  diclofenac sodium (VOLTAREN) 1 % GEL, Apply topically to affected area qid, Disp: 100 g, Rfl: 1 .  doxycycline (VIBRA-TABS) 100 MG tablet, Take 1 tablet (100 mg total) by mouth 2 (two) times daily., Disp: 20 tablet, Rfl: 0 .  escitalopram (LEXAPRO) 20 MG tablet, Take 1 tablet by mouth  daily., Disp: , Rfl:  .  magnesium citrate SOLN, Take 1 Bottle by mouth as needed. , Disp: , Rfl:  .  metoprolol succinate (TOPROL-XL) 25 MG 24 hr tablet, Take 1-2 tablets (25-50 mg total) by mouth daily. NEED OV., Disp: 180 tablet, Rfl: 0 .  Multiple Vitamin (MULTIVITAMIN) tablet, Take 1 tablet by mouth daily.  , Disp: , Rfl:  .  polyethylene glycol (MIRALAX / GLYCOLAX) packet, Take 17 g by mouth daily., Disp: , Rfl:  .  Propylene Glycol (SYSTANE BALANCE) 0.6 % SOLN, Apply 1 drop to eye 2 (two) times daily. For dry eyes, takes along with restasis, Disp: , Rfl:  .  rOPINIRole (REQUIP) 0.25 MG tablet, Take 1 tablet (0.25 mg total) by mouth at bedtime., Disp: 90 tablet, Rfl: 2 .  valACYclovir (VALTREX) 500 MG tablet, Take 1 tablet (500 mg total) by mouth 2 (two) times daily., Disp: 10 tablet, Rfl: 0   Allergies  Allergen Reactions  . Ambien [Zolpidem Tartrate] Other (See Comments)    Causes Neurological problems with very bad dizziness, pains, disorientation  . Cortisone Other (See Comments)    REACTION: "mania" post ESI  . Eszopiclone And Related Other (See Comments)    Causes Neurological problems with very bad dizziness, pains, disorientation  . Levofloxacin Other (See Comments)    Muscle spasms and jerking movements  . Zolpidem Other (See Comments)    Causes Neurological problems with very bad dizziness, pains, disorientation  . Dicloxacillin Other (See Comments)    Stomach problems, swelling of tongue  . Dilantin [Phenytoin Sodium Extended] Hives and Itching  . Erythromycin Other (See Comments)    Tears stomach up  . Avelox [Moxifloxacin Hcl In Nacl]     Pt unsure of reaction.  . Other     Celiac disease  . Nitrofuran Derivatives Other (See Comments)    unknown  . Phenytoin Sodium Extended Rash  . Trazodone And Nefazodone Other (See Comments)    unknown   Review of Systems   Pertinent items are noted in the HPI. Otherwise, a complete ROS is negative.  Vitals:   Vitals:     04/17/18 1507  BP: 112/64  Pulse: 67  Temp: 98.1 F (36.7 C)  TempSrc: Oral  Weight: 162 lb (73.5 kg)  Height: 5' 6"  (1.676 m)     Body mass index is 26.15 kg/m.  Physical Exam:   Physical Exam Vitals signs and nursing note reviewed.  HENT:     Head: Normocephalic and atraumatic.  Eyes:     Pupils: Pupils are equal, round, and reactive to light.  Neck:     Musculoskeletal: Normal range of motion and neck supple.  Cardiovascular:     Rate and Rhythm: Normal rate and regular rhythm.     Heart sounds: Normal heart sounds.  Pulmonary:     Effort: Pulmonary effort is normal.  Abdominal:     Palpations: Abdomen is soft.  Skin:    General: Skin is warm.  Psychiatric:        Behavior: Behavior normal.    Assessment and Plan:   Shell was seen today for cough.  Diagnoses and all orders for this visit:  Post-viral cough syndrome Comments: No evidence of current infection. Discussed expectations, precuations, and symptomatic care.   Need for pneumococcal vaccination -     Pneumococcal polysaccharide vaccine 23-valent greater than or equal to 2yo subcutaneous/IM   . Orders and follow up as documented in [place], reviewed diet, exercise and weight control, cardiovascular risk and specific lipid/LDL goals reviewed, reviewed medications and side effects in detail.  . Reviewed expectations re: course of current medical issues. . Outlined signs and symptoms indicating need for more acute intervention. . Patient verbalized understanding and all questions were answered.  Briscoe Deutscher, DO [place], [facility] 04/22/2018

## 2018-04-21 ENCOUNTER — Other Ambulatory Visit: Payer: Self-pay | Admitting: Family Medicine

## 2018-04-21 DIAGNOSIS — M797 Fibromyalgia: Secondary | ICD-10-CM

## 2018-04-22 ENCOUNTER — Encounter: Payer: Self-pay | Admitting: Family Medicine

## 2018-05-21 ENCOUNTER — Other Ambulatory Visit: Payer: Self-pay | Admitting: Cardiology

## 2018-05-24 ENCOUNTER — Ambulatory Visit (INDEPENDENT_AMBULATORY_CARE_PROVIDER_SITE_OTHER): Payer: Medicare Other | Admitting: Family Medicine

## 2018-05-24 ENCOUNTER — Encounter: Payer: Self-pay | Admitting: Family Medicine

## 2018-05-24 VITALS — BP 122/74 | HR 78 | Temp 98.2°F | Ht 66.0 in | Wt 157.2 lb

## 2018-05-24 DIAGNOSIS — R51 Headache: Secondary | ICD-10-CM | POA: Diagnosis not present

## 2018-05-24 DIAGNOSIS — R519 Headache, unspecified: Secondary | ICD-10-CM

## 2018-05-24 MED ORDER — GABAPENTIN 300 MG PO CAPS: 300.0000 mg | ORAL_CAPSULE | Freq: Three times a day (TID) | ORAL | 0 refills | Status: DC

## 2018-05-24 MED ORDER — VALACYCLOVIR HCL 1 G PO TABS: 1000.0000 mg | ORAL_TABLET | Freq: Three times a day (TID) | ORAL | 0 refills | Status: DC

## 2018-05-24 NOTE — Progress Notes (Signed)
Patient: Amanda [name] MRN: [id number] DOB: 09-23-1950 PCP: Briscoe Deutscher, DO     Subjective:  Chief Complaint  Patient presents with  . pain R side of face    HPI: The patient is a 68 y.o. female who presents today for possible shingles. She has had chicken pox in the past. She has never had shingles in the past. 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 She states she has had tingling and tenderness on the left side of her face for a few weeks. Can be a sharp pain and hard to sleep on that left side. Symptoms are actually along all 3 distributions of her trigeminal nerve. She denies any dental work or headaches. If she touches the area it does not trigger the pain. She denies any recent illness, fever/chills. She did hit or head so not sure if related. advil does help. She denies any pain with chewing. Her left ear does hurt.   Review of Systems  Constitutional: Negative for chills and fever.  HENT: Positive for ear pain (left ear ). Negative for congestion, dental problem and rhinorrhea.   Eyes: Negative for pain and visual disturbance.  Respiratory: Negative for cough and shortness of breath.   Musculoskeletal: Negative for arthralgias and neck pain.  Neurological: Negative for dizziness, facial asymmetry, numbness and headaches.       Pt c/o tingling and tenderness on left side of face    Allergies Patient is allergic to [employer] tartrate]; cortisone; eszopiclone and related; levofloxacin; zolpidem; dicloxacillin; dilantin [phenytoin sodium extended]; erythromycin; avelox [moxifloxacin hcl in nacl]; other; nitrofuran derivatives; phenytoin sodium extended; and trazodone and nefazodone.  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  Surgical History Patient  has a past surgical history that includes Knee surgery (Bilateral, 2002); Abdominal hysterectomy (1986); Colonoscopy; Esophagogastroduodenoscopy; Breast biopsy; Reduction mammaplasty (Bilateral); Breast reduction surgery (1999); Subdural hematoma evacuation via craniotomy (2010); and Thyroidectomy, partial.  Family History Pateint's family history includes Breast cancer (age of onset: 58) in her maternal aunt; Diabetes in her maternal aunt; Heart disease in her maternal grandfather; Hyperlipidemia in her maternal grandfather; Hypertension in her paternal grandfather; Liver cancer in her father.  Social History Patient  reports that she has never smoked. She has never used smokeless tobacco. She reports current alcohol use of about 2.0 - 3.0 standard drinks of alcohol per week. She reports that she does not use drugs.    Objective: Vitals:   05/24/18 1128  BP: 122/74  Pulse: 78  Temp: 98.2 F (36.8 C)  TempSrc: Oral  SpO2: 96%  Weight: 157 lb 3.2 oz (71.3 kg)  Height: 5' 6"  (1.676 m)    Body mass index is 25.37 kg/m.  Physical Exam Vitals signs reviewed.  Constitutional:      Appearance: Normal appearance.  HENT:     Head: Normocephalic and atraumatic.     Right Ear: Tympanic membrane, ear canal and external ear normal.     Left Ear: Tympanic membrane and ear canal normal.     Nose: Nose normal. No congestion.     Mouth/Throat:     Mouth: Mucous membranes are moist.     Comments: Teeth normal  Eyes:     Extraocular Movements: Extraocular movements  intact.     Pupils: Pupils are equal, round, and reactive to light.  Neck:     Musculoskeletal: Normal range of motion and neck supple. No muscular tenderness.  Cardiovascular:     Rate and Rhythm: Normal rate and regular rhythm.     Heart sounds: Normal heart sounds.  Pulmonary:      Effort: Pulmonary effort is normal.     Breath sounds: Normal breath sounds.  Abdominal:     General: Abdomen is flat. Bowel sounds are normal.     Palpations: Abdomen is soft.  Lymphadenopathy:     Cervical: No cervical adenopathy.  Neurological:     General: No focal deficit present.     Mental Status: She is alert and oriented to person, place, and time.     Cranial Nerves: No cranial nerve deficit.     Comments: No pain with palpation over distribution of trigeminal nerve on left side of face. No swelling or erythema, no rash on face. No pain in TMJ or with opening or closing jaw.         Assessment/plan: 1. Left facial pain Differential of trigeminal neuralgia much higher than shingles. She has had for a few weeks. Will treat with 7 day course of valacyclovir, but discussed im concerned for TN. Continue with NSAIDs and can try capsaicin cream prn as well. Also sending in gabapentin to try prn to see if helps as it can help with pain in shingles, but limited evidence if helps with TN. Side effects discussed. If not better, f/u with pcp.    Return if symptoms worsen or fail to improve.   Orma Flaming, MD [place]   05/24/2018

## 2018-05-24 NOTE — Patient Instructions (Signed)
7 day course of valacyclovir to treat shingles, even though im not convinced you have this. Symptoms seem more consistent with trigeminal neuralgia or irritation of this nerve.  Keep up the advil and do heating pad. Would do trial of capsician cream (tiger balm) over the counter. Just don't get near eye.    Also sending in gabapentin for nerve pain that helps in shingles. Not well studied for trigeminal neuralgia, but may have some benefit. If not better, please come back as further work up will need to be done or send to neurology per Dr. [name] recommendations.

## 2018-05-27 ENCOUNTER — Other Ambulatory Visit: Payer: Self-pay | Admitting: Sports Medicine

## 2018-06-04 ENCOUNTER — Telehealth: Payer: Self-pay | Admitting: Family Medicine

## 2018-06-04 DIAGNOSIS — B009 Herpesviral infection, unspecified: Secondary | ICD-10-CM

## 2018-06-04 MED ORDER — VALACYCLOVIR HCL 500 MG PO TABS: 500.0000 mg | ORAL_TABLET | Freq: Two times a day (BID) | ORAL | 0 refills | Status: DC

## 2018-06-04 NOTE — Addendum Note (Signed)
Addended by: Briscoe Deutscher R on: 06/04/2018 01:37 PM   Modules accepted: Orders

## 2018-06-04 NOTE — Telephone Encounter (Signed)
See note  Copied from [place] [id number]. Topic: General - Other >> Jun 04, 2018 10:21 AM Leward Quan A wrote: Reason for CRM: Patient called to say that valACYclovir (VALTREX) 1000 MG tablet are too big and she cant swallow them asking for the valACYclovir (VALTREX) 500 MG tablet. Please advise Cb# [phone]

## 2018-06-10 ENCOUNTER — Encounter: Payer: Self-pay | Admitting: Sports Medicine

## 2018-06-14 ENCOUNTER — Other Ambulatory Visit: Payer: Self-pay | Admitting: Family Medicine

## 2018-06-30 ENCOUNTER — Other Ambulatory Visit: Payer: Self-pay | Admitting: Cardiology

## 2018-07-04 ENCOUNTER — Other Ambulatory Visit: Payer: Self-pay | Admitting: Family Medicine

## 2018-07-06 DIAGNOSIS — M797 Fibromyalgia: Secondary | ICD-10-CM | POA: Diagnosis not present

## 2018-07-06 DIAGNOSIS — K900 Celiac disease: Secondary | ICD-10-CM | POA: Diagnosis not present

## 2018-07-06 DIAGNOSIS — H26492 Other secondary cataract, left eye: Secondary | ICD-10-CM | POA: Diagnosis not present

## 2018-07-06 DIAGNOSIS — H04123 Dry eye syndrome of bilateral lacrimal glands: Secondary | ICD-10-CM | POA: Diagnosis not present

## 2018-07-06 DIAGNOSIS — H1859 Other hereditary corneal dystrophies: Secondary | ICD-10-CM | POA: Diagnosis not present

## 2018-07-06 DIAGNOSIS — Z961 Presence of intraocular lens: Secondary | ICD-10-CM | POA: Diagnosis not present

## 2018-07-06 DIAGNOSIS — H1851 Endothelial corneal dystrophy: Secondary | ICD-10-CM | POA: Diagnosis not present

## 2018-07-07 ENCOUNTER — Encounter: Payer: Self-pay | Admitting: Gastroenterology

## 2018-07-13 DIAGNOSIS — M25561 Pain in right knee: Secondary | ICD-10-CM | POA: Diagnosis not present

## 2018-07-13 DIAGNOSIS — M25562 Pain in left knee: Secondary | ICD-10-CM | POA: Diagnosis not present

## 2018-07-13 DIAGNOSIS — M1711 Unilateral primary osteoarthritis, right knee: Secondary | ICD-10-CM | POA: Insufficient documentation

## 2018-07-13 DIAGNOSIS — M1712 Unilateral primary osteoarthritis, left knee: Secondary | ICD-10-CM | POA: Insufficient documentation

## 2018-07-21 ENCOUNTER — Other Ambulatory Visit: Payer: Self-pay | Admitting: Cardiology

## 2018-07-23 ENCOUNTER — Ambulatory Visit: Payer: Self-pay

## 2018-07-23 ENCOUNTER — Other Ambulatory Visit: Payer: Self-pay

## 2018-07-23 MED ORDER — METOPROLOL SUCCINATE ER 25 MG PO TB24: 25.0000 mg | ORAL_TABLET | Freq: Every day | ORAL | 0 refills | Status: DC

## 2018-07-23 NOTE — Telephone Encounter (Signed)
Out going call to Patient who reports that she has been having. Diarrhea stools.  Approximately  4 to 7.  Watery snake like stools.  Onset was today.  Denies vomiting.   Reports abdominal Pain.  Denies signs and Sx of dehydration. On  February 22 Returned home from [place].  Denies knowinly being in contact with someone Dx with Covid 19.  Denies use of antibiotic.  Reviewed protocol with Patient.  Recommended office visit within 3 days.  Patient states that she will monitor her symptoms.  Diarrhea stools are decreasing.  Patient states that if Sx. [place].  Patient will call back.    Reason for Disposition . [1] MILD diarrhea (e.g., 1-3 or more stools than normal in past 24 hours) without known cause AND [2] present >  7 days  Answer Assessment - Initial Assessment Questions 1. DIARRHEA SEVERITY: "How bad is the diarrhea?" "How many extra stools have you had in the past 24 hours than normal?"    - NO DIARRHEA (SCALE 0)   - MILD (SCALE 1-3): Few loose or mushy BMs; increase of 1-3 stools over normal daily number of stools; mild increase in ostomy output.   -  MODERATE (SCALE 4-7): Increase of 4-6 stools daily over normal; moderate increase in ostomy output. * SEVERE (SCALE 8-10; OR 'WORST POSSIBLE'): Increase of 7 or more stools daily over normal; moderate increase in ostomy output; incontinence.     Moderate watery snake like 2. ONSET: "When did the diarrhea begin?"      today 3. BM CONSISTENCY: "How loose or watery is the diarrhea?"      See # 1 4. VOMITING: "Are you also vomiting?" If so, ask: "How many times in the past 24 hours?"      denies 5. ABDOMINAL PAIN: "Are you having any abdominal pain?" If yes: "What does it feel like?" (e.g., crampy, dull, intermittent, constant)      yes6. ABDOMINAL PAIN SEVERITY: If present, ask: "How bad is the pain?"  (e.g., Scale 1-10; mild, moderate, or severe)   - MILD (1-3): doesn't interfere with normal activities, abdomen soft and not tender  to touch    - MODERATE (4-7): interferes with normal activities or awakens from sleep, tender to touch    - SEVERE (8-10): excruciating pain, doubled over, unable to do any normal activities      mild 7. ORAL INTAKE: If vomiting, "Have you been able to drink liquids?" "How much fluids have you had in the past 24 hours?"     denies 8. HYDRATION: "Any signs of dehydration?" (e.g., dry mouth [not just dry lips], too weak to stand, dizziness, new weight loss) "When did you last urinate?"     denies 9. EXPOSURE: "Have you traveled to a foreign country recently?" "Have you been exposed to anyone with diarrhea?" "Could you have eaten any food that was spoiled?"     22 Feb home [place] 10. ANTIBIOTIC USE: "Are you taking antibiotics now or have you taken antibiotics in the past 2 months?"       Denies  11. OTHER SYMPTOMS: "Do you have any other symptoms?" (e.g., fever, blood in stool)       denies 12. PREGNANCY: "Is there any chance you are pregnant?" "When was your last menstrual period?"       denies  Protocols used: DIARRHEA-A-AH

## 2018-08-16 ENCOUNTER — Other Ambulatory Visit: Payer: Self-pay | Admitting: Cardiology

## 2018-09-12 ENCOUNTER — Telehealth: Payer: Self-pay | Admitting: *Deleted

## 2018-09-12 NOTE — Telephone Encounter (Signed)
Left detailed message on personal voicemail calling to schedule 6 month follow up with Dr. [name] please call the office to schedule.

## 2018-09-16 ENCOUNTER — Other Ambulatory Visit: Payer: Self-pay | Admitting: Cardiology

## 2018-09-19 ENCOUNTER — Other Ambulatory Visit (INDEPENDENT_AMBULATORY_CARE_PROVIDER_SITE_OTHER): Payer: Medicare Other

## 2018-09-19 ENCOUNTER — Ambulatory Visit (INDEPENDENT_AMBULATORY_CARE_PROVIDER_SITE_OTHER): Payer: Medicare Other | Admitting: Family Medicine

## 2018-09-19 ENCOUNTER — Encounter: Payer: Self-pay | Admitting: Family Medicine

## 2018-09-19 VITALS — Ht 66.0 in

## 2018-09-19 DIAGNOSIS — Z8669 Personal history of other diseases of the nervous system and sense organs: Secondary | ICD-10-CM

## 2018-09-19 DIAGNOSIS — Z9189 Other specified personal risk factors, not elsewhere classified: Secondary | ICD-10-CM | POA: Diagnosis not present

## 2018-09-19 DIAGNOSIS — R739 Hyperglycemia, unspecified: Secondary | ICD-10-CM

## 2018-09-19 DIAGNOSIS — I5189 Other ill-defined heart diseases: Secondary | ICD-10-CM

## 2018-09-19 DIAGNOSIS — C73 Malignant neoplasm of thyroid gland: Secondary | ICD-10-CM | POA: Diagnosis not present

## 2018-09-19 DIAGNOSIS — E88819 Insulin resistance, unspecified: Secondary | ICD-10-CM

## 2018-09-19 DIAGNOSIS — I519 Heart disease, unspecified: Secondary | ICD-10-CM | POA: Diagnosis not present

## 2018-09-19 DIAGNOSIS — E559 Vitamin D deficiency, unspecified: Secondary | ICD-10-CM

## 2018-09-19 DIAGNOSIS — E8881 Metabolic syndrome: Secondary | ICD-10-CM

## 2018-09-19 DIAGNOSIS — E785 Hyperlipidemia, unspecified: Secondary | ICD-10-CM

## 2018-09-19 DIAGNOSIS — K900 Celiac disease: Secondary | ICD-10-CM | POA: Diagnosis not present

## 2018-09-19 NOTE — Progress Notes (Signed)
Virtual Visit via Video   Due to the COVID-19 pandemic, this visit was completed with telemedicine (audio/video) technology to reduce patient and provider exposure as well as to preserve personal protective equipment.   I connected with [name] by a video enabled telemedicine application and verified that I am speaking with the correct person using two identifiers. Location patient: Home Location provider: [facility], Office Persons participating in the virtual visit: Leahanna, Buser, DO   I discussed the limitations of evaluation and management by telemedicine and the availability of in person appointments. The patient expressed understanding and agreed to proceed.  Care Team   Patient Care Team: Briscoe Deutscher, DO as PCP - General (Family Medicine) Jarome Matin, MD as Consulting Physician (Dermatology)  Subjective:   HPI:  She has history of Gillain Barre syndrome.  Chronic fatigue Doing well. Walking daily. Fresh air and sunshine have helped immensely. Thinks that prayer has made her completely better.   Fibromyalgia Working on exercise, anti-inflammatory diet. Has been walking or riding bike every day. Was able to go out on the beach at [facility] this weekend. Last August she went to see a friend that prays for people around the world, her friend prayed over her and she hasn't had any pain since then. Has been eating well overall.   Hyperlipidemia Is the patient taking medications without problems? Yes. Does the patient complain of muscle aches?  No. Trying to exercise on a regular basis? Yes. Compliant with diet? Yes.  Lab Results  Component Value Date   CHOL 219 (H) 03/30/2018   HDL 73.20 03/30/2018   LDLCALC 131 (H) 03/30/2018   LDLDIRECT 139.2 03/10/2008   TRIG 76.0 03/30/2018   CHOLHDL 3 03/30/2018   Lab Results  Component Value Date   ALT 20 03/30/2018   AST 26 03/30/2018   ALKPHOS 76 03/30/2018   BILITOT 0.6 03/30/2018      Review of Systems  Constitutional: Negative.   Eyes: Negative.   Respiratory: Negative.   Cardiovascular: Negative.   Gastrointestinal: Negative.   Genitourinary: Negative.   Musculoskeletal: Positive for joint pain.  Skin: Negative.   Neurological: Negative.   Endo/Heme/Allergies: Negative.   Psychiatric/Behavioral: Negative.      Patient Active Problem List   Diagnosis Date Noted  . History of myasthenia gravis 04/01/2018  . Risk of myocardial infarction or stroke 7.5% or greater in next 10 years 03/30/2018  . Mastalgia, left, followed by GYN 03/29/2018  . Fibromyalgia 03/29/2018  . Fuchs' corneal dystrophy, 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  . Insulin resistance 03/29/2018  . History of hysterectomy 03/29/2018  . Grade I diastolic dysfunction 93/81/0175  . Thyroid nodule 03/27/2018  . Presbycusis of both ears 03/10/2017  . Dysthymia 09/26/2016  . Anxiety, prn Klonopin 08/03/2016  . Laryngopharyngeal reflux (LPR) 07/26/2016  . Thyroid cancer ([place])   . GERD (gastroesophageal reflux disease)   . Restless leg syndrome, on Requip   . Allergic rhinitis 06/25/2009  . Diaphragmatic hernia 02/16/2009  . IBS (irritable bowel syndrome) 02/16/2009  . Hyperlipidemia with target LDL less than 130, on Lipitor 09/26/2008  . Celiac disease 11/27/2007  . Sleep disorder 07/05/2007  . Osteoarthritis 07/05/2007  . Osteopenia 10/03/2006  . Left carotid bruit 10/03/2006  . History of thyroid cancer 10/03/2006    Social History   Tobacco Use  . Smoking status: Never Smoker  . Smokeless tobacco: Never Used  Substance Use Topics  . Alcohol use: Yes    Alcohol/week:  2.0 - 3.0 standard drinks    Types: 1 - 2 Cans of beer, 1 Standard drinks or equivalent per week    Current Outpatient Medications:  .  APPLE CIDER VINEGAR PO, Take 1 capsule by mouth 2 (two) times daily. Reported on 06/09/2015, Disp: , Rfl:  .  atorvastatin (LIPITOR) 20 MG tablet, TAKE 1 TABLET BY MOUTH EVERY OTHER DAY,  Disp: 15 tablet, Rfl: 0 .  baclofen (LIORESAL) 20 MG tablet, TAKE 1 TABLET (20 MG TOTAL) BY MOUTH AT BEDTIME AS NEEDED FOR MUSCLE SPASMS., Disp: 30 tablet, Rfl: 0 .  bisacodyl (DULCOLAX) 5 MG EC tablet, Take 5 mg by mouth daily as needed for moderate constipation., Disp: , Rfl:  .  CALCIUM-VITAMIN D PO, Take 2 tablets by mouth daily., Disp: , Rfl:  .  clonazePAM (KLONOPIN) 1 MG tablet, Take 1 tablet (1 mg total) by mouth daily as needed for anxiety., Disp: 30 tablet, Rfl: 2 .  CO ENZYME Q-10 PO, Take by mouth daily. , Disp: , Rfl:  .  diclofenac sodium (VOLTAREN) 1 % GEL, APPLY TOPICALLY TO AFFECTED AREA 4 TIMES A DAY, Disp: 100 g, Rfl: 1 .  escitalopram (LEXAPRO) 10 MG tablet, Take 10 mg by mouth daily., Disp: , Rfl:  .  gabapentin (NEURONTIN) 300 MG capsule, TAKE 1 CAPSULE BY MOUTH THREE TIMES A DAY, Disp: 270 capsule, Rfl: 1 .  magnesium citrate SOLN, Take 1 Bottle by mouth as needed. , Disp: , Rfl:  .  metoprolol succinate (TOPROL-XL) 25 MG 24 hr tablet, Take 1-2 tablets (25-50 mg total) by mouth daily. Please call and schedule an appt for furhter refills. 1st attempt, Disp: 60 tablet, Rfl: 0 .  Multiple Vitamin (MULTIVITAMIN) tablet, Take 1 tablet by mouth daily.  , Disp: , Rfl:  .  polyethylene glycol (MIRALAX / GLYCOLAX) packet, Take 17 g by mouth daily., Disp: , Rfl:  .  PROAIR HFA 108 (90 Base) MCG/ACT inhaler, INHALE 1 PUFF INTO THE LUNGS EVERY 6 (SIX) HOURS AS NEEDED FOR WHEEZING OR SHORTNESS OF BREATH., Disp: 8.5 Inhaler, Rfl: 0 .  Propylene Glycol (SYSTANE BALANCE) 0.6 % SOLN, Apply 1 drop to eye 2 (two) times daily. For dry eyes, takes along with restasis, Disp: , Rfl:  .  rOPINIRole (REQUIP) 0.25 MG tablet, Take 1 tablet (0.25 mg total) by mouth at bedtime., Disp: 90 tablet, Rfl: 2  Allergies  Allergen Reactions  . Ambien [Zolpidem Tartrate] Other (See Comments)    Causes Neurological problems with very bad dizziness, pains, disorientation  . Cortisone Other (See Comments)     REACTION: "mania" post ESI  . Eszopiclone And Related Other (See Comments)    Causes Neurological problems with very bad dizziness, pains, disorientation  . Levofloxacin Other (See Comments)    Muscle spasms and jerking movements  . Zolpidem Other (See Comments)    Causes Neurological problems with very bad dizziness, pains, disorientation  . Dicloxacillin Other (See Comments)    Stomach problems, swelling of tongue  . Dilantin [Phenytoin Sodium Extended] Hives and Itching  . Erythromycin Other (See Comments)    Tears stomach up  . Avelox [Moxifloxacin Hcl In Nacl]     Pt unsure of reaction.  . Other     Celiac disease  . Nitrofuran Derivatives Other (See Comments)    unknown  . Phenytoin Sodium Extended Rash  . Trazodone And Nefazodone Other (See Comments)    unknown    Objective:   VITALS: Per patient if applicable, see vitals.  GENERAL: Alert, appears well and in no acute distress. HEENT: Atraumatic, conjunctiva clear, no obvious abnormalities on inspection of external nose and ears. NECK: Normal movements of the head and neck. CARDIOPULMONARY: No increased WOB. Speaking in clear sentences. I:E ratio WNL.  MS: Moves all visible extremities without noticeable abnormality. PSYCH: Pleasant and cooperative, well-groomed. Speech normal rate and rhythm. Affect is appropriate. Insight and judgement are appropriate. Attention is focused, linear, and appropriate.  NEURO: CN grossly intact. Oriented as arrived to appointment on time with no prompting. Moves both UE equally.  SKIN: No obvious lesions, wounds, erythema, or cyanosis noted on face or hands.  Depression screen [facility] 2/9 03/30/2018 03/30/2018 10/04/2017  Decreased Interest 0 0 2  Down, Depressed, Hopeless 0 0 1  PHQ - 2 Score 0 0 3  Altered sleeping 3 0 1  Tired, decreased energy 3 0 2  Change in appetite 0 0 2  Feeling bad or failure about yourself  0 0 0  Trouble concentrating 0 0 2  Moving slowly or fidgety/restless 0 0  0  Suicidal thoughts 0 0 0  PHQ-9 Score 6 0 10  Difficult doing work/chores Not difficult at all Not difficult at all Very difficult  Some recent data might be hidden    Assessment and Plan:   Amanda [name] was seen today for follow-up.  Diagnoses and all orders for this visit:  Hyperlipidemia with target LDL less than 130, on Lipitor -     Comprehensive metabolic panel; Future -     Lipid panel; Future  History of myasthenia gravis  Celiac disease  Grade I diastolic dysfunction  Risk of myocardial infarction or stroke 7.5% or greater in next 10 years  Vitamin D deficiency -     VITAMIN D 25 Hydroxy (Vit-D Deficiency, Fractures); Future  Insulin resistance  Thyroid cancer ([place]) -     CBC with Differential/Platelet; Future -     TSH; Future  Hyperglycemia -     Hemoglobin A1c; Future  Well controlled.  No signs of complications, medication side effects, or red flags.  Continue current regimen.    [name] COVID-19 Education: The signs and symptoms of COVID-19 were discussed with the patient and how to seek care for testing if needed. The importance of social distancing was discussed today. . Reviewed expectations re: course of current medical issues. . Discussed self-management of symptoms. . Outlined signs and symptoms indicating need for more acute intervention. . Patient verbalized understanding and all questions were answered. [name] Health Maintenance issues including appropriate healthy diet, exercise, and smoking avoidance were discussed with patient. . See orders for this visit as documented in the electronic medical record.  Briscoe Deutscher, DO  Records requested if needed. Time spent: 25 minutes, of which >50% was spent in obtaining information about her symptoms, reviewing her previous labs, evaluations, and treatments, counseling her about her condition (please see the discussed topics above), and developing a plan to further investigate it; she had a number of questions which  I addressed.

## 2018-09-20 LAB — CBC WITH DIFFERENTIAL/PLATELET
Basophils Absolute: 0 10*3/uL (ref 0.0–0.1)
Basophils Relative: 1 % (ref 0.0–3.0)
Eosinophils Absolute: 0.1 10*3/uL (ref 0.0–0.7)
Eosinophils Relative: 1.4 % (ref 0.0–5.0)
HCT: 38.5 % (ref 36.0–46.0)
Hemoglobin: 13.2 g/dL (ref 12.0–15.0)
Lymphocytes Relative: 22.3 % (ref 12.0–46.0)
Lymphs Abs: 1.1 10*3/uL (ref 0.7–4.0)
MCHC: 34.2 g/dL (ref 30.0–36.0)
MCV: 91.8 fl (ref 78.0–100.0)
Monocytes Absolute: 0.3 10*3/uL (ref 0.1–1.0)
Monocytes Relative: 6.1 % (ref 3.0–12.0)
Neutro Abs: 3.3 10*3/uL (ref 1.4–7.7)
Neutrophils Relative %: 69.2 % (ref 43.0–77.0)
Platelets: 221 10*3/uL (ref 150.0–400.0)
RBC: 4.19 Mil/uL (ref 3.87–5.11)
RDW: 13 % (ref 11.5–15.5)
WBC: 4.8 10*3/uL (ref 4.0–10.5)

## 2018-09-20 LAB — COMPREHENSIVE METABOLIC PANEL
ALT: 22 U/L (ref 0–35)
AST: 26 U/L (ref 0–37)
Albumin: 4.6 g/dL (ref 3.5–5.2)
Alkaline Phosphatase: 84 U/L (ref 39–117)
BUN: 14 mg/dL (ref 6–23)
CO2: 26 mEq/L (ref 19–32)
Calcium: 9.6 mg/dL (ref 8.4–10.5)
Chloride: 102 mEq/L (ref 96–112)
Creatinine, Ser: 0.84 mg/dL (ref 0.40–1.20)
GFR: 67.34 mL/min (ref >60.00)
Glucose, Bld: 116 mg/dL — ABNORMAL HIGH (ref 70–99)
Potassium: 4.1 mEq/L (ref 3.5–5.1)
Sodium: 137 mEq/L (ref 135–145)
Total Bilirubin: 0.6 mg/dL (ref 0.2–1.2)
Total Protein: 7.1 g/dL (ref 6.0–8.3)

## 2018-09-20 LAB — LIPID PANEL
Cholesterol: 243 mg/dL — ABNORMAL HIGH (ref 0–200)
HDL: 71 mg/dL (ref >39.00)
LDL Cholesterol: 139 mg/dL — ABNORMAL HIGH (ref 0–99)
NonHDL: 172.15
Total CHOL/HDL Ratio: 3
Triglycerides: 166 mg/dL — ABNORMAL HIGH (ref 0.0–149.0)
VLDL: 33.2 mg/dL (ref 0.0–40.0)

## 2018-09-20 LAB — VITAMIN D 25 HYDROXY (VIT D DEFICIENCY, FRACTURES): VITD: 31.1 ng/mL (ref 30.00–100.00)

## 2018-09-20 LAB — HEMOGLOBIN A1C: Hgb A1c MFr Bld: 5.8 % (ref 4.6–6.5)

## 2018-09-20 LAB — TSH: TSH: 2 u[IU]/mL (ref 0.35–4.50)

## 2018-09-27 ENCOUNTER — Other Ambulatory Visit: Payer: Self-pay | Admitting: Family Medicine

## 2018-09-27 DIAGNOSIS — B009 Herpesviral infection, unspecified: Secondary | ICD-10-CM

## 2018-10-12 ENCOUNTER — Other Ambulatory Visit: Payer: Self-pay | Admitting: Family Medicine

## 2018-10-12 DIAGNOSIS — F5101 Primary insomnia: Secondary | ICD-10-CM

## 2018-10-13 ENCOUNTER — Other Ambulatory Visit: Payer: Self-pay | Admitting: Family Medicine

## 2018-10-15 ENCOUNTER — Other Ambulatory Visit: Payer: Self-pay | Admitting: Gynecology

## 2018-10-15 DIAGNOSIS — Z1231 Encounter for screening mammogram for malignant neoplasm of breast: Secondary | ICD-10-CM

## 2018-10-23 DIAGNOSIS — D2262 Melanocytic nevi of left upper limb, including shoulder: Secondary | ICD-10-CM | POA: Diagnosis not present

## 2018-10-23 DIAGNOSIS — D2271 Melanocytic nevi of right lower limb, including hip: Secondary | ICD-10-CM | POA: Diagnosis not present

## 2018-10-23 DIAGNOSIS — L812 Freckles: Secondary | ICD-10-CM | POA: Diagnosis not present

## 2018-10-23 DIAGNOSIS — D1801 Hemangioma of skin and subcutaneous tissue: Secondary | ICD-10-CM | POA: Diagnosis not present

## 2018-10-23 DIAGNOSIS — Z85828 Personal history of other malignant neoplasm of skin: Secondary | ICD-10-CM | POA: Diagnosis not present

## 2018-10-23 DIAGNOSIS — D2272 Melanocytic nevi of left lower limb, including hip: Secondary | ICD-10-CM | POA: Diagnosis not present

## 2018-10-23 DIAGNOSIS — L308 Other specified dermatitis: Secondary | ICD-10-CM | POA: Diagnosis not present

## 2018-10-23 DIAGNOSIS — D2261 Melanocytic nevi of right upper limb, including shoulder: Secondary | ICD-10-CM | POA: Diagnosis not present

## 2018-10-23 DIAGNOSIS — D225 Melanocytic nevi of trunk: Secondary | ICD-10-CM | POA: Diagnosis not present

## 2018-10-23 DIAGNOSIS — L821 Other seborrheic keratosis: Secondary | ICD-10-CM | POA: Diagnosis not present

## 2018-10-31 ENCOUNTER — Telehealth: Payer: Self-pay

## 2018-10-31 NOTE — Telephone Encounter (Signed)
-----   Message from Micheline Chapman sent at 10/31/2018  3:21 PM EDT ----- Regarding: Pt would like to have Cholesterol Checked Ms. Somero called to make an appt to have her cholesterol checked, she would like to have another car visit. Would Dr. [name] put an order in for her to have this done without having an appt? Please let me know so I can call patient back, thanks!

## 2018-10-31 NOTE — Telephone Encounter (Signed)
LDM for patient to call office.  Result Notes for Lipid panel  Notes recorded by Briscoe Deutscher, DO on 09/20/2018 at 12:31 PM EDT  Labs continue to hold steady! However, mostly due to your cholesterol panel (even with the great HDL), I want to offer to get a Cardiac CT Score. I do this for anyone with an ASCVD over 7 (see yours below). Basically, it is a low-dose CT that will look at your lungs and your coronary arteries to look for plaque. This gives [place] another calculation that tells [place] your risk for upcoming heart issues. Insurance does not cover - cost is $150. Let me know what you think.

## 2018-11-01 ENCOUNTER — Other Ambulatory Visit: Payer: Self-pay | Admitting: Family Medicine

## 2018-11-01 ENCOUNTER — Encounter: Payer: Self-pay | Admitting: *Deleted

## 2018-11-01 DIAGNOSIS — B009 Herpesviral infection, unspecified: Secondary | ICD-10-CM

## 2018-11-02 NOTE — Telephone Encounter (Signed)
I spoke with patient and she informed me that she has a scheduled appointment to be seen with Dr. [name].

## 2018-11-08 ENCOUNTER — Ambulatory Visit: Payer: Medicare Other

## 2018-11-08 VITALS — Ht 65.0 in | Wt 155.0 lb

## 2018-11-08 DIAGNOSIS — Z86010 Personal history of colonic polyps: Secondary | ICD-10-CM

## 2018-11-08 MED ORDER — SUPREP BOWEL PREP KIT 17.5-3.13-1.6 GM/177ML PO SOLN: 1.0000 | Freq: Once | ORAL | 0 refills | Status: AC

## 2018-11-08 NOTE — Progress Notes (Signed)
Per pt, no allergies to soy or egg products.Pt not taking any weight loss meds or using  O2 at home. Pt denies sedation problem.  Emmi video info mailed to pt.  The PV was done over the phone due to COVID-19. Verified pt's address and insurance. Reviewed medical hx and prep instructions and will mail paperwork to the pt. today .Pt will call if she has any questions or changes prior to her procedure.

## 2018-11-11 NOTE — Progress Notes (Signed)
Virtual Visit via Video   Due to the COVID-19 pandemic, this visit was completed with telemedicine (audio/video) technology to reduce patient and provider exposure as well as to preserve personal protective equipment.   I connected with Amanda [name] by a video enabled telemedicine application and verified that I am speaking with the correct person using two identifiers. Location patient: Home Location provider: [facility], Office Persons participating in the virtual visit: DIANELYS SCINTO, Briscoe Deutscher, DO Lonell Grandchild, CMA acting as scribe for Dr. Briscoe Deutscher.   I discussed the limitations of evaluation and management by telemedicine and the availability of in person appointments. The patient expressed understanding and agreed to proceed.  Care Team   Patient Care Team: Briscoe Deutscher, DO as PCP - General (Family Medicine) Jarome Matin, MD as Consulting Physician (Dermatology)  Subjective:   HPI: Patient would like to talk about cholesterol mediation. She wanted to know if she can have her labs rechecked. She has worked hard on making sure that she takes her medications correctly and working on improving diet and exercise.  Walking daily. Does sometimes get leg cramps. Taking statin qod. Wants to hold on cardiac ct until new labs back.  Review of Systems  Constitutional: Negative for chills and fever.  HENT: Negative for hearing loss and tinnitus.   Eyes: Negative for blurred vision and double vision.  Respiratory: Negative for cough and wheezing.   Cardiovascular: Positive for leg swelling. Negative for chest pain and palpitations.       B/L Ankle swelling when on feet.   Gastrointestinal: Negative for nausea and vomiting.  Genitourinary: Negative for dysuria and urgency.  Musculoskeletal: Negative for myalgias and neck pain.  Neurological: Negative for dizziness and headaches.  Psychiatric/Behavioral: Negative for depression and suicidal ideas.    Patient  Active Problem List   Diagnosis Date Noted  . 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 Osteoarthritis of left knee 07/13/2018  . Osteoarthritis of right knee 07/13/2018  . History of myasthenia gravis 04/01/2018  . Risk of myocardial infarction or stroke 7.5% or greater in next 10 years 03/30/2018  . Mastalgia, left, followed by GYN 03/29/2018  . Fibromyalgia 03/29/2018  . Fuchs' corneal dystrophy, 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  . Insulin resistance 03/29/2018  . History of hysterectomy 03/29/2018  . Grade I diastolic dysfunction 32/67/1245  . Thyroid nodule 03/27/2018  . Presbycusis of both ears 03/10/2017  . Dysthymia 09/26/2016  . Anxiety, prn Klonopin 08/03/2016  . Laryngopharyngeal reflux (LPR) 07/26/2016  . Thyroid cancer ([place])   . GERD (gastroesophageal reflux disease)   . Restless leg syndrome, on Requip   . Allergic rhinitis 06/25/2009  . Diaphragmatic hernia 02/16/2009  . IBS (irritable bowel syndrome) 02/16/2009  . Hyperlipidemia with target LDL less than 130, on Lipitor 09/26/2008  . Celiac disease 11/27/2007  . Sleep disorder 07/05/2007  . Osteoarthritis 07/05/2007  . Osteopenia 10/03/2006  . Left carotid bruit 10/03/2006  . History of thyroid cancer 10/03/2006    Social History   Tobacco Use  . Smoking status: Never Smoker  . Smokeless tobacco: Never Used  Substance Use Topics  . Alcohol use: Yes    Alcohol/week: 8.0 standard drinks    Types: 7 Cans of beer, 1 Standard drinks or equivalent per week   Current Outpatient Medications:  .  APPLE CIDER VINEGAR PO, Take 1 capsule by mouth 2 (two) times daily. Reported on 06/09/2015, Disp: , Rfl:  .  aspirin EC 81 MG tablet, Take  81 mg by mouth daily., Disp: , Rfl:  .  atorvastatin (LIPITOR) 20 MG tablet, TAKE 1 TABLET BY MOUTH EVERY OTHER DAY, Disp: 45 tablet, Rfl: 1 .  bisacodyl (DULCOLAX) 5 MG EC tablet, Take 5 mg by mouth daily as needed for moderate constipation., Disp: , Rfl:  .  Black Currant Seed Oil  500 MG CAPS, Take by mouth daily., Disp: , Rfl:  .  CALCIUM-VITAMIN D PO, Take 1 tablet by mouth daily. , Disp: , Rfl:  .  cholecalciferol (VITAMIN D3) 25 MCG (1000 UT) tablet, Take 1,000 Units by mouth daily. Take 2 in the am, Disp: , Rfl:  .  clonazePAM (KLONOPIN) 1 MG tablet, TAKE 1 TABLET BY MOUTH DAILY AS NEEDED FOR ANXIETY (Patient taking differently: Take 1/2 tablet nightly for sleep), Disp: 30 tablet, Rfl: 1 .  CO ENZYME Q-10 PO, Take by mouth daily. , Disp: , Rfl:  .  Cyanocobalamin (VITAMIN B-12 ER PO), Take 5,000 mcg by mouth daily., Disp: , Rfl:  .  diclofenac sodium (VOLTAREN) 1 % GEL, APPLY TOPICALLY TO AFFECTED AREA 4 TIMES A DAY, Disp: 100 g, Rfl: 1 .  ELDERBERRY PO, Take by mouth. Take one daily, Disp: , Rfl:  .  escitalopram (LEXAPRO) 10 MG tablet, Take 10 mg by mouth daily., Disp: , Rfl:  .  magnesium citrate SOLN, Take 1 Bottle by mouth as needed. , Disp: , Rfl:  .  Melatonin 3 MG CAPS, Take by mouth at bedtime., Disp: , Rfl:  .  metoprolol succinate (TOPROL-XL) 25 MG 24 hr tablet, Take 1-2 tablets (25-50 mg total) by mouth daily. Please call and schedule an appt for furhter refills. 1st attempt (Patient taking differently: Take 25 mg by mouth daily. ), Disp: 60 tablet, Rfl: 0 .  Multiple Vitamin (MULTIVITAMIN) tablet, Take 1 tablet by mouth daily.  , Disp: , Rfl:  .  NON FORMULARY, Bausch and lomb ointment-Muro-//128, 5%ointment- Each eye nightly, Disp: , Rfl:  .  OIL OF [place] PO, Take by mouth. Take one daily, Disp: , Rfl:  .  polyethylene glycol (MIRALAX / GLYCOLAX) packet, Take 17 g by mouth daily., Disp: , Rfl:  .  Propylene Glycol (SYSTANE BALANCE) 0.6 % SOLN, Apply 1 drop to eye 2 (two) times daily. For dry eyes, takes along with restasis, Disp: , Rfl:  .  rOPINIRole (REQUIP) 0.25 MG tablet, Take 1 tablet (0.25 mg total) by mouth at bedtime., Disp: 90 tablet, Rfl: 2 .  valACYclovir (VALTREX) 500 MG tablet, TAKE ONE TABLET TWICE A DAY AS NEEDED FOR OUTBREAKS., Disp: 90  tablet, Rfl: 0  Allergies  Allergen Reactions  . Ambien [Zolpidem Tartrate] Other (See Comments)    Causes Neurological problems with very bad dizziness, pains, disorientation  . Cortisone Other (See Comments)    REACTION: "mania" post ESI  . Eszopiclone And Related Other (See Comments)    Causes Neurological problems with very bad dizziness, pains, disorientation  . Levofloxacin Other (See Comments)    Muscle spasms and jerking movements  . Zolpidem Other (See Comments)    Causes Neurological problems with very bad dizziness, pains, disorientation  . Dicloxacillin Other (See Comments)    Stomach problems, swelling of tongue  . Dilantin [Phenytoin Sodium Extended] Hives and Itching  . Erythromycin Other (See Comments)    Tears stomach up  . Avelox [Moxifloxacin Hcl In Nacl]     Pt unsure of reaction.  . Other     Celiac disease  . Nitrofuran Derivatives Other (  See Comments)    unknown  . Phenytoin Sodium Extended Rash  . Trazodone And Nefazodone Other (See Comments)    unknown   Objective:   VITALS: Per patient if applicable, see vitals. GENERAL: Alert, appears well and in no acute distress. HEENT: Atraumatic, conjunctiva clear, no obvious abnormalities on inspection of external nose and ears. NECK: Normal movements of the head and neck. CARDIOPULMONARY: No increased WOB. Speaking in clear sentences. I:E ratio WNL.  MS: Moves all visible extremities without noticeable abnormality. PSYCH: Pleasant and cooperative, well-groomed. Speech normal rate and rhythm. Affect is appropriate. Insight and judgement are appropriate. Attention is focused, linear, and appropriate.  NEURO: CN grossly intact. Oriented as arrived to appointment on time with no prompting. Moves both UE equally.  SKIN: No obvious lesions, wounds, erythema, or cyanosis noted on face or hands.  Depression screen [facility] 2/9 11/12/2018 03/30/2018 03/30/2018  Decreased Interest 0 0 0  Down, Depressed, Hopeless 0 0 0  PHQ  - 2 Score 0 0 0  Altered sleeping 0 3 0  Tired, decreased energy 0 3 0  Change in appetite 0 0 0  Feeling bad or failure about yourself  0 0 0  Trouble concentrating 0 0 0  Moving slowly or fidgety/restless 0 0 0  Suicidal thoughts 0 0 0  PHQ-9 Score 0 6 0  Difficult doing work/chores Not difficult at all Not difficult at all Not difficult at all  Some recent data might be hidden   Assessment and Plan:   Marshall was seen today for follow-up.  Diagnoses and all orders for this visit:  Hyperlipidemia with target LDL less than 130, on Lipitor -     Comprehensive metabolic panel; Future -     Lipid panel; Future  Insulin resistance  Risk of myocardial infarction or stroke 7.5% or greater in next 10 years  Myalgia -     CK; Future   . COVID-19 Education: The signs and symptoms of COVID-19 were discussed with the patient and how to seek care for testing if needed. The importance of social distancing was discussed today. . Reviewed expectations re: course of current medical issues. . Discussed self-management of symptoms. . Outlined signs and symptoms indicating need for more acute intervention. . Patient verbalized understanding and all questions were answered. [name] Health Maintenance issues including appropriate healthy diet, exercise, and smoking avoidance were discussed with patient. . See orders for this visit as documented in the electronic medical record.  Briscoe Deutscher, DO  Records requested if needed. Time spent: 25 minutes, of which >50% was spent in obtaining information about her symptoms, reviewing her previous labs, evaluations, and treatments, counseling her about her condition (please see the discussed topics above), and developing a plan to further investigate it; she had a number of questions which I addressed.

## 2018-11-12 ENCOUNTER — Encounter: Payer: Self-pay | Admitting: Family Medicine

## 2018-11-12 ENCOUNTER — Other Ambulatory Visit: Payer: Self-pay

## 2018-11-12 ENCOUNTER — Ambulatory Visit (INDEPENDENT_AMBULATORY_CARE_PROVIDER_SITE_OTHER): Payer: Medicare Other | Admitting: Family Medicine

## 2018-11-12 VITALS — HR 60 | Temp 97.3°F | Ht 65.0 in | Wt 155.0 lb

## 2018-11-12 DIAGNOSIS — Z9189 Other specified personal risk factors, not elsewhere classified: Secondary | ICD-10-CM | POA: Diagnosis not present

## 2018-11-12 DIAGNOSIS — E785 Hyperlipidemia, unspecified: Secondary | ICD-10-CM

## 2018-11-12 DIAGNOSIS — E8881 Metabolic syndrome: Secondary | ICD-10-CM | POA: Diagnosis not present

## 2018-11-12 DIAGNOSIS — E88819 Insulin resistance, unspecified: Secondary | ICD-10-CM

## 2018-11-12 DIAGNOSIS — M791 Myalgia, unspecified site: Secondary | ICD-10-CM

## 2018-11-21 ENCOUNTER — Telehealth: Payer: Self-pay | Admitting: Gastroenterology

## 2018-11-21 NOTE — Telephone Encounter (Signed)
Spoke with patient regarding Covid-19 screening questions. Covid-19 Screening Questions:  Do you now or have you had a fever in the last 14 days? no  Do you have any respiratory symptoms of shortness of breath or cough now or in the last 14 days? no  Do you have any family members or close contacts with diagnosed or suspected Covid-19 in the past 14 days? no  Have you been tested for Covid-19 and found to be positive? no  Pt made aware of that care partner may wait in the car or come up to the lobby during the procedure but will need to provide their own mask.

## 2018-11-22 ENCOUNTER — Other Ambulatory Visit: Payer: Self-pay

## 2018-11-22 ENCOUNTER — Encounter: Payer: Self-pay | Admitting: Gastroenterology

## 2018-11-22 ENCOUNTER — Ambulatory Visit (AMBULATORY_SURGERY_CENTER): Payer: Medicare Other | Admitting: Gastroenterology

## 2018-11-22 VITALS — BP 114/78 | HR 54 | Temp 98.5°F | Resp 14 | Ht 65.0 in | Wt 155.0 lb

## 2018-11-22 DIAGNOSIS — D120 Benign neoplasm of cecum: Secondary | ICD-10-CM | POA: Diagnosis not present

## 2018-11-22 DIAGNOSIS — Z86010 Personal history of colonic polyps: Secondary | ICD-10-CM

## 2018-11-22 DIAGNOSIS — D128 Benign neoplasm of rectum: Secondary | ICD-10-CM

## 2018-11-22 DIAGNOSIS — D175 Benign lipomatous neoplasm of intra-abdominal organs: Secondary | ICD-10-CM | POA: Diagnosis not present

## 2018-11-22 MED ORDER — SODIUM CHLORIDE 0.9 % IV SOLN: 500.0000 mL | Freq: Once | INTRAVENOUS | Status: DC

## 2018-11-22 NOTE — Patient Instructions (Signed)
Please read handouts provided. Miralax twice daily and titrate as needed for constipation. Await pathology results. No ibuprofen, naproxen, or other non-steriodal anti-inflammatory drugs for 2 weeks. Continue present medications.      YOU HAD AN ENDOSCOPIC PROCEDURE TODAY AT [facility]:   Refer to the procedure report that was given to you for any specific questions about what was found during the examination.  If the procedure report does not answer your questions, please call your gastroenterologist to clarify.  If you requested that your care partner not be given the details of your procedure findings, then the procedure report has been included in a sealed envelope for you to review at your convenience later.  YOU SHOULD EXPECT: Some feelings of bloating in the abdomen. Passage of more gas than usual.  Walking can help get rid of the air that was put into your GI tract during the procedure and reduce the bloating. If you had a lower endoscopy (such as a colonoscopy or flexible sigmoidoscopy) you may notice spotting of blood in your stool or on the toilet paper. If you underwent a bowel prep for your procedure, you may not have a normal bowel movement for a few days.  Please Note:  You might notice some irritation and congestion in your nose or some drainage.  This is from the oxygen used during your procedure.  There is no need for concern and it should clear up in a day or so.  SYMPTOMS TO REPORT IMMEDIATELY:   Following lower endoscopy (colonoscopy or flexible sigmoidoscopy):  Excessive amounts of blood in the stool  Significant tenderness or worsening of abdominal pains  Swelling of the abdomen that is new, acute  Fever of 100F or higher   For urgent or emergent issues, a gastroenterologist can be reached at any hour by calling [phone].   DIET:  We do recommend a small meal at first, but then you may proceed to your regular diet.  Drink plenty of fluids  but you should avoid alcoholic beverages for 24 hours.  ACTIVITY:  You should plan to take it easy for the rest of today and you should NOT DRIVE or use heavy machinery until tomorrow (because of the sedation medicines used during the test).    FOLLOW UP: Our staff will call the number listed on your records 48-72 hours following your procedure to check on you and address any questions or concerns that you may have regarding the information given to you following your procedure. If we do not reach you, we will leave a message.  We will attempt to reach you two times.  During this call, we will ask if you have developed any symptoms of COVID 19. If you develop any symptoms (ie: fever, flu-like symptoms, shortness of breath, cough etc.) before then, please call [phone].  If you test positive for Covid 19 in the 2 weeks post procedure, please call and report this information to [place].    If any biopsies were taken you will be contacted by phone or by letter within the next 1-3 weeks.  Please call us at [phone] if you have not heard about the biopsies in 3 weeks.    SIGNATURES/CONFIDENTIALITY: You and/or your care partner have signed paperwork which will be entered into your electronic medical record.  These signatures attest to the fact that that the information above on your After Visit Summary has been reviewed and is understood.  Full responsibility of the confidentiality of this  discharge information lies with you and/or your care-partner.

## 2018-11-22 NOTE — Op Note (Signed)
[facility] Patient Name: Amanda [name] Procedure Date: 11/22/2018 8:04 AM MRN: [id number] Endoscopist: Remo Lipps P. Havery Moros , MD Age: 68 Referring MD:  Date of Birth: Sep 30, 1950 Gender: Female Account #: [id number] Procedure:                Colonoscopy Indications:              High risk colon cancer surveillance: Personal                            history of sessile serrated colon polyp (10 mm or                            greater in size) 3 years ago Medicines:                Monitored Anesthesia Care Procedure:                Pre-Anesthesia Assessment:                           - Prior to the procedure, a History and Physical                            was performed, and patient medications and                            allergies were reviewed. The patient's tolerance of                            previous anesthesia was also reviewed. The risks                            and benefits of the procedure and the sedation                            options and risks were discussed with the patient.                            All questions were answered, and informed consent                            was obtained. Prior Anticoagulants: The patient has                            taken no previous anticoagulant or antiplatelet                            agents. ASA Grade Assessment: II - A patient with                            mild systemic disease. After reviewing the risks                            and benefits, the patient was deemed in  satisfactory condition to undergo the procedure.                           After obtaining informed consent, the colonoscope                            was passed under direct vision. Throughout the                            procedure, the patient's blood pressure, pulse, and                            oxygen saturations were monitored continuously. The                            Colonoscope was introduced  through the anus and                            advanced to the the cecum, identified by                            appendiceal orifice and ileocecal valve. The                            colonoscopy was performed without difficulty. The                            patient tolerated the procedure well. The quality                            of the bowel preparation was good. The ileocecal                            valve, appendiceal orifice, and rectum were                            photographed. Scope In: 8:10:03 AM Scope Out: 8:37:29 AM Scope Withdrawal Time: 0 hours 22 minutes 51 seconds  Total Procedure Duration: 0 hours 27 minutes 26 seconds  Findings:                 The perianal and digital rectal examinations were                            normal.                           A 5 mm polyp was found in the cecum. The polyp was                            flat. The polyp was removed with a cold snare.                            Resection and retrieval were complete.  A 4 to 5 mm polyp was found in the rectum. The                            polyp had a yellowish hue with a suspected                            subepithelial component and hard to palpation,                            possible carcinoid. The polyp was easily removed                            with a hot snare. Resection and retrieval were                            complete. Area inferior to the polyp was tattooed                            with an injection of Spot (carbon black).                           The exam was otherwise without abnormality. Complications:            No immediate complications. Estimated blood loss:                            Minimal. Estimated Blood Loss:     Estimated blood loss was minimal. Impression:               - One 5 mm polyp in the cecum, removed with a cold                            snare. Resected and retrieved.                           - One 4 to 5 mm  polyp in the rectum, removed with a                            hot snare. Resected and retrieved. Tattoo placed.                           - The examination was otherwise normal. Recommendation:           - Patient has a contact number available for                            emergencies. The signs and symptoms of potential                            delayed complications were discussed with the                            patient. Return to normal activities tomorrow.  Written discharge instructions were provided to the                            patient.                           - Resume previous diet.                           - Continue present medications.                           - Miralax twice daily and titrate as needed for                            constipation                           - Await pathology results.                           - No ibuprofen, naproxen, or other non-steroidal                            anti-inflammatory drugs for 2 weeks after polyp                            removal. Remo Lipps P. Armbruster, MD 11/22/2018 8:43:51 AM This report has been signed electronically.

## 2018-11-22 NOTE — Progress Notes (Signed)
Report given to PACU, vss 

## 2018-11-22 NOTE — Progress Notes (Signed)
Pt's states no medical or surgical changes since previsit or office visit. 

## 2018-11-22 NOTE — Progress Notes (Signed)
Called to room to assist during endoscopic procedure.  Patient ID and intended procedure confirmed with present staff. Received instructions for my participation in the procedure from the performing physician.  

## 2018-11-22 NOTE — Progress Notes (Signed)
Pena

## 2018-11-26 ENCOUNTER — Telehealth: Payer: Self-pay

## 2018-11-26 ENCOUNTER — Telehealth: Payer: Self-pay | Admitting: *Deleted

## 2018-11-26 NOTE — Telephone Encounter (Signed)
NO ANSWER, MESSAGE LEFT FOR PATIENT.

## 2018-11-26 NOTE — Telephone Encounter (Signed)
  Follow up Call-  Call back number 11/22/2018  Post procedure Call Back phone  # [phone]  Permission to leave phone message Yes  Some recent data might be hidden     Patient questions:  Do you have a fever, pain , or abdominal swelling? No. Pain Score  0 *  Have you tolerated food without any problems? Yes.    Have you been able to return to your normal activities? Yes.    Do you have any questions about your discharge instructions: Diet   No. Medications  No. Follow up visit  No.  Do you have questions or concerns about your Care? No.  Actions: * If pain score is 4 or above: No action needed, pain <4.  1. Have you developed a fever since your procedure? no  2.   Have you had an respiratory symptoms (SOB or cough) since your procedure? no  3.   Have you tested positive for COVID 19 since your procedure no  4.   Have you had any family members/close contacts diagnosed with the COVID 19 since your procedure?  no   If yes to any of these questions please route to Joylene John, RN and Alphonsa Gin, Therapist, sports.

## 2018-12-03 ENCOUNTER — Other Ambulatory Visit: Payer: Self-pay | Admitting: Gynecology

## 2018-12-03 ENCOUNTER — Ambulatory Visit
Admission: RE | Admit: 2018-12-03 | Discharge: 2018-12-03 | Disposition: A | Discharge status: Home / Self Care | Payer: Medicare Other | Source: Ambulatory Visit | Attending: Gynecology | Admitting: Gynecology

## 2018-12-03 ENCOUNTER — Other Ambulatory Visit: Payer: Self-pay

## 2018-12-03 ENCOUNTER — Telehealth: Payer: Self-pay

## 2018-12-03 DIAGNOSIS — Z872 Personal history of diseases of the skin and subcutaneous tissue: Secondary | ICD-10-CM

## 2018-12-03 DIAGNOSIS — N644 Mastodynia: Secondary | ICD-10-CM

## 2018-12-03 DIAGNOSIS — Z1231 Encounter for screening mammogram for malignant neoplasm of breast: Secondary | ICD-10-CM

## 2018-12-03 NOTE — Telephone Encounter (Signed)
Patient called because she went to [facility] to have screening mammogram and told them about some indentation at armpit on right side. They would not do her screening mammo and told her to cal her doctor. I explained when there is a problem that it becomes a diagnostic evaluation and the physician needs to order the tests and he needs to do an exam before doing that. She was fine with that. Her last CE was 2018.  Appt desk had a cancellation tomorrow at noon for CE so patient was very happy and will come for that appointment.

## 2018-12-04 ENCOUNTER — Encounter: Payer: Self-pay | Admitting: Gynecology

## 2018-12-04 ENCOUNTER — Ambulatory Visit (INDEPENDENT_AMBULATORY_CARE_PROVIDER_SITE_OTHER): Payer: Medicare Other | Admitting: Gynecology

## 2018-12-04 VITALS — BP 122/80 | Ht 65.0 in | Wt 159.0 lb

## 2018-12-04 DIAGNOSIS — Z9289 Personal history of other medical treatment: Secondary | ICD-10-CM

## 2018-12-04 DIAGNOSIS — N644 Mastodynia: Secondary | ICD-10-CM | POA: Diagnosis not present

## 2018-12-04 DIAGNOSIS — N952 Postmenopausal atrophic vaginitis: Secondary | ICD-10-CM

## 2018-12-04 DIAGNOSIS — M8580 Other specified disorders of bone density and structure, unspecified site: Secondary | ICD-10-CM

## 2018-12-04 DIAGNOSIS — N9089 Other specified noninflammatory disorders of vulva and perineum: Secondary | ICD-10-CM | POA: Diagnosis not present

## 2018-12-04 DIAGNOSIS — Z01419 Encounter for gynecological examination (general) (routine) without abnormal findings: Secondary | ICD-10-CM | POA: Diagnosis not present

## 2018-12-04 NOTE — Patient Instructions (Signed)
Follow up for the Bone density and the Colposcopy appointments as scheduled

## 2018-12-04 NOTE — Progress Notes (Signed)
    Amanda [name] 1950-07-16 [id number]        67 y.o.  G2P2002 for breast and pelvic exam.  Complaining of left breast pain in the tail of Spence region.  Similar complaints last year.  Mammogram and ultrasound were negative at that time.  She notes that her pain seemed to get better but now has returned.  No palpable abnormalities on self breast exam.  Also notes an indentation in her right axilla area over the last several months.  No palpable masses but more of an indentation.  Lastly notes a bump on the vulvar skin she wants me to look at.  Past medical history,surgical history, problem list, medications, allergies, family history and social history were all reviewed and documented as reviewed in the EPIC chart.  ROS:  Performed with pertinent positives and negatives included in the history, assessment and plan.   Additional significant findings : None   Exam: Wandra Scot assistant Vitals:   12/04/18 1154  BP: 122/80  Weight: 159 lb (72.1 kg)  Height: 5' 5"  (1.651 m)   Body mass index is 26.46 kg/m.  General appearance:  Normal affect, orientation and appearance. Skin: Grossly normal HEENT: Without gross lesions.  No cervical or supraclavicular adenopathy. Thyroid normal.  Lungs:  Clear without wheezing, rales or rhonchi Cardiac: RR, without RMG Abdominal:  Soft, nontender, without masses, guarding, rebound, organomegaly or hernia Breasts:  Examined lying and sitting without masses, retractions, discharge or axillary adenopathy.  The area in the left breast with the patient's pointing is in the lateral aspect along the junction of the breast tissue with rib cage.  No palpable abnormalities noted.  It is in the scar line of her reduction surgery.  Her right axillary region is normal with no palpable abnormalities. Pelvic:  Ext, BUS, Vagina: With atrophic changes.  Area of raised white skin change as diagrammed.  Adnexa: Without masses or tenderness    Anus and perineum: Normal    Rectovaginal: Normal sphincter tone without palpated masses or tenderness.    Assessment/Plan:  68 y.o. G28P2002 female for breast and pelvic exam.  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 Raised white area left lower labia minora.  Discussed differential to include sebaceous, warty growth, VIN.  Need for further evaluation discussed.  Will schedule colposcopy appointment for excisional. 2. Mastalgia left breast.  Indentation right axilla.  No palpable abnormalities.  Similar complaints last year in the left breast which seem to have resolved but returned.  We will pursue diagnostic mammography and ultrasound of the left breast and ultrasound of the right axilla for completeness.  Patient will continue with self breast exams and report any palpable abnormalities. 3. Osteopenia.  DEXA 2017 T score -2.1 FRAX 9% / 1%.  Schedule DEXA now at 3-year interval and she will make arrangements for this. 4. Colonoscopy this month. 5. Pap smear 2018.  Patient uncomfortable with the stop screening recommendations based on hysterectomy and age.  Pap smear was done today. 6. Health maintenance.  No routine lab work done as patient does this elsewhere.  Follow-up for the DEXA and colposcopy appointments and the breast studies as arranged.   [name], 12:29 PM 12/04/2018

## 2018-12-04 NOTE — Addendum Note (Signed)
Addended by: Lorine Bears on: 12/04/2018 02:09 PM   Modules accepted: Orders

## 2018-12-05 ENCOUNTER — Telehealth: Payer: Self-pay | Admitting: *Deleted

## 2018-12-05 DIAGNOSIS — N644 Mastodynia: Secondary | ICD-10-CM

## 2018-12-05 LAB — PAP IG W/ RFLX HPV ASCU

## 2018-12-05 NOTE — Telephone Encounter (Signed)
-----   Message from Anastasio Auerbach, MD sent at 12/04/2018 12:34 PM EDT ----- Schedule diagnostic mammography and ultrasound left breast reference left breast pain tail of Spence.  Also schedule right axillary ultrasound.  Patient notes indentation in the right axilla

## 2018-12-05 NOTE — Telephone Encounter (Signed)
Patient scheduled at breast center on 12/07/18 1:30pm. Patient informed.

## 2018-12-07 ENCOUNTER — Ambulatory Visit
Admission: RE | Admit: 2018-12-07 | Discharge: 2018-12-07 | Disposition: A | Discharge status: Home / Self Care | Payer: Medicare Other | Source: Ambulatory Visit | Attending: Gynecology | Admitting: Gynecology

## 2018-12-07 ENCOUNTER — Other Ambulatory Visit: Payer: Self-pay

## 2018-12-07 DIAGNOSIS — R922 Inconclusive mammogram: Secondary | ICD-10-CM | POA: Diagnosis not present

## 2018-12-07 DIAGNOSIS — N644 Mastodynia: Secondary | ICD-10-CM

## 2018-12-08 DIAGNOSIS — D071 Carcinoma in situ of vulva: Secondary | ICD-10-CM

## 2018-12-08 HISTORY — DX: Carcinoma in situ of vulva: D07.1

## 2018-12-09 ENCOUNTER — Other Ambulatory Visit: Payer: Self-pay | Admitting: Family Medicine

## 2018-12-09 DIAGNOSIS — B009 Herpesviral infection, unspecified: Secondary | ICD-10-CM

## 2018-12-10 NOTE — Telephone Encounter (Signed)
Last fill 11/01/18  #90/0 Last OV 11/12/18 Next OV 01/22/19

## 2018-12-14 ENCOUNTER — Other Ambulatory Visit: Payer: Self-pay | Admitting: Family Medicine

## 2018-12-14 ENCOUNTER — Other Ambulatory Visit: Payer: Self-pay

## 2018-12-14 DIAGNOSIS — B009 Herpesviral infection, unspecified: Secondary | ICD-10-CM

## 2018-12-16 ENCOUNTER — Other Ambulatory Visit: Payer: Self-pay | Admitting: Family Medicine

## 2018-12-16 DIAGNOSIS — G2581 Restless legs syndrome: Secondary | ICD-10-CM

## 2018-12-17 ENCOUNTER — Encounter: Payer: Self-pay | Admitting: Gynecology

## 2018-12-17 ENCOUNTER — Ambulatory Visit (INDEPENDENT_AMBULATORY_CARE_PROVIDER_SITE_OTHER): Payer: Medicare Other | Admitting: Gynecology

## 2018-12-17 ENCOUNTER — Other Ambulatory Visit: Payer: Self-pay

## 2018-12-17 VITALS — BP 122/76

## 2018-12-17 DIAGNOSIS — D071 Carcinoma in situ of vulva: Secondary | ICD-10-CM

## 2018-12-17 DIAGNOSIS — N9089 Other specified noninflammatory disorders of vulva and perineum: Secondary | ICD-10-CM

## 2018-12-17 NOTE — Patient Instructions (Signed)
Office will call you with biopsy results 

## 2018-12-17 NOTE — Progress Notes (Signed)
    AUDERY WASSENAAR 07-Oct-1950 [id number]        68 y.o.  B0S1115 presents for colposcopy.  Recent annual exam showed a raised white skin lesion on the vulva.  No history of same before.  Pap smear 2020 normal  Past medical history,surgical history, problem list, medications, allergies, family history and social history were all reviewed and documented in the EPIC chart.  Directed ROS with pertinent positives and negatives documented in the history of present illness/assessment and plan.  Exam: [name] assistant Vitals:   12/17/18 0857  BP: 122/76   General appearance:  Normal External BUS vagina with raised white lesion lower left labia minora lateral to posterior fourchette  Colposcopy performed of the vulva from the mons to the perianal region after acetic acid cleanse shows isolated white raised lesion left lower labia minora lateral to posterior fourchette.  Physical Exam  Genitourinary:     Procedure: The skin overlying and surrounding the raised white lesion was cleansed with Betadine, infiltrated with 1% lidocaine and the lesion was grossly excised in its entirety.  Three 4-0 Vicryl interrupted cutaneous sutures placed to reapproximate the skin and for hemostasis.  The patient tolerated well.  Specimen sent to pathology.   Assessment/Plan:  68 y.o. 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  Excised in its entirety grossly.  Discussed various pathology possibilities to include high-grade VIN as well as vulvar carcinoma.  The issues of involved margins also discussed.  Various follow-ups to include repeat colposcopy in several months after healing to assess for any residual disease up to and including GYN oncology referral discussed.  Patient will follow-up for pathology results.   [name], 9:21 AM 12/17/2018

## 2018-12-19 ENCOUNTER — Encounter: Payer: Self-pay | Admitting: Gynecology

## 2018-12-19 LAB — PATHOLOGY REPORT

## 2018-12-19 LAB — TISSUE SPECIMEN

## 2018-12-31 DIAGNOSIS — F4322 Adjustment disorder with anxiety: Secondary | ICD-10-CM | POA: Diagnosis not present

## 2019-01-15 ENCOUNTER — Ambulatory Visit: Payer: Self-pay

## 2019-01-15 ENCOUNTER — Ambulatory Visit: Payer: Medicare Other | Admitting: Physician Assistant

## 2019-01-15 DIAGNOSIS — S70362A Insect bite (nonvenomous), left thigh, initial encounter: Secondary | ICD-10-CM | POA: Diagnosis not present

## 2019-01-15 DIAGNOSIS — L03116 Cellulitis of left lower limb: Secondary | ICD-10-CM | POA: Diagnosis not present

## 2019-01-15 NOTE — Telephone Encounter (Signed)
Noted  

## 2019-01-15 NOTE — Telephone Encounter (Signed)
Pt. Reports she was outside working in the yard this weekend. Noticed 2 bites on her left inner thigh. 2 "pustule areas with 3-4 inches of redness around the sites." Warm to touch, appears to have a red streak running up her leg. Temp. 97. Mild pain, very itchy. Warm transfer to [facility] in the practice for a virtual visit.  Answer Assessment - Initial Assessment Questions 1. TYPE of INSECT: "What type of insect was it?"      Unsure 2. ONSET: "When did you get bitten?"      The weekend 3. LOCATION: "Where is the insect bite located?"      Left leg, inside aspect of the thigh 4. REDNESS: "Is the area red or pink?" If so, ask "What size is area of redness?" (inches or cm). "When did the redness start?"     2 areas of redness around a pustule - 3-4 inches 5. PAIN: "Is there any pain?" If so, ask: "How bad is it?"  (Scale 1-10; or mild, moderate, severe)     Mild pain 6. ITCHING: "Does it itch?" If so, ask: "How bad is the itch?"    - MILD: doesn't interfere with normal activities   - MODERATE-SEVERE: interferes with work, school, sleep, or other activities      Moderate 7. SWELLING: "How big is the swelling?" (inches, cm, or compare to coins)     Mild swelling 8. OTHER SYMPTOMS: "Do you have any other symptoms?"  (e.g., difficulty breathing, hives)     Red streak 9. PREGNANCY: "Is there any chance you are pregnant?" "When was your last menstrual period?"     No  Protocols used: INSECT BITE-A-AH

## 2019-01-15 NOTE — Telephone Encounter (Signed)
See note

## 2019-01-21 ENCOUNTER — Other Ambulatory Visit: Payer: Self-pay

## 2019-01-22 ENCOUNTER — Other Ambulatory Visit (INDEPENDENT_AMBULATORY_CARE_PROVIDER_SITE_OTHER): Payer: Medicare Other

## 2019-01-22 ENCOUNTER — Ambulatory Visit: Payer: Medicare Other

## 2019-01-22 DIAGNOSIS — E785 Hyperlipidemia, unspecified: Secondary | ICD-10-CM

## 2019-01-22 DIAGNOSIS — M791 Myalgia, unspecified site: Secondary | ICD-10-CM | POA: Diagnosis not present

## 2019-01-22 LAB — COMPREHENSIVE METABOLIC PANEL
ALT: 21 U/L (ref 0–35)
AST: 25 U/L (ref 0–37)
Albumin: 4.6 g/dL (ref 3.5–5.2)
Alkaline Phosphatase: 71 U/L (ref 39–117)
BUN: 15 mg/dL (ref 6–23)
CO2: 29 mEq/L (ref 19–32)
Calcium: 9.5 mg/dL (ref 8.4–10.5)
Chloride: 104 mEq/L (ref 96–112)
Creatinine, Ser: 0.82 mg/dL (ref 0.40–1.20)
GFR: 69.17 mL/min (ref >60.00)
Glucose, Bld: 102 mg/dL — ABNORMAL HIGH (ref 70–99)
Potassium: 3.9 mEq/L (ref 3.5–5.1)
Sodium: 141 mEq/L (ref 135–145)
Total Bilirubin: 0.7 mg/dL (ref 0.2–1.2)
Total Protein: 6.9 g/dL (ref 6.0–8.3)

## 2019-01-22 LAB — LIPID PANEL
Cholesterol: 178 mg/dL (ref 0–200)
HDL: 70.7 mg/dL (ref >39.00)
LDL Cholesterol: 92 mg/dL (ref 0–99)
NonHDL: 107.39
Total CHOL/HDL Ratio: 3
Triglycerides: 78 mg/dL (ref 0.0–149.0)
VLDL: 15.6 mg/dL (ref 0.0–40.0)

## 2019-01-22 LAB — CK: Total CK: 48 U/L (ref 7–177)

## 2019-01-22 NOTE — Progress Notes (Signed)
Virtual Visit via Video   Due to the COVID-19 pandemic, this visit was completed with telemedicine (audio/video) technology to reduce patient and provider exposure as well as to preserve personal protective equipment.   I connected with Amanda [name] by a video enabled telemedicine application and verified that I am speaking with the correct person using two identifiers. Location patient: Home Location provider: [facility], Office Persons participating in the virtual visit: Shenice, Dolder, DO   I discussed the limitations of evaluation and management by telemedicine and the availability of in person appointments. The patient expressed understanding and agreed to proceed.  Care Team   Patient Care Team: Briscoe Deutscher, DO as PCP - General (Family Medicine) Jarome Matin, MD as Consulting Physician (Dermatology)  Subjective:   HPI:  Is the patient taking medications without problems? Yes. Does the patient complain of muscle aches?  No. Trying to exercise on a regular basis? Yes. Compliant with diet? Yes.  Lab Results  Component Value Date   CHOL 178 01/22/2019   HDL 70.70 01/22/2019   LDLCALC 92 01/22/2019   LDLDIRECT 139.2 03/10/2008   TRIG 78.0 01/22/2019   CHOLHDL 3 01/22/2019   Lab Results  Component Value Date   ALT 21 01/22/2019   AST 25 01/22/2019   ALKPHOS 71 01/22/2019   BILITOT 0.7 01/22/2019     Review of Systems  Constitutional: Negative for chills, fever, malaise/fatigue and weight loss.  Respiratory: Negative for cough, shortness of breath and wheezing.   Cardiovascular: Negative for chest pain, palpitations and leg swelling.  Gastrointestinal: Negative for abdominal pain, constipation, diarrhea, nausea and vomiting.  Genitourinary: Negative for dysuria and urgency.  Musculoskeletal: Negative for joint pain and myalgias.  Skin: Negative for rash.  Neurological: Negative for dizziness and headaches.  Psychiatric/Behavioral:  Negative for depression, substance abuse and suicidal ideas. The patient is not nervous/anxious.     Patient Active Problem List   Diagnosis Date Noted  . 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 Osteoarthritis of left knee 07/13/2018  . Osteoarthritis of right knee 07/13/2018  . History of myasthenia gravis 04/01/2018  . Risk of myocardial infarction or stroke 7.5% or greater in next 10 years 03/30/2018  . Mastalgia, left, followed by GYN 03/29/2018  . Fibromyalgia 03/29/2018  . Fuchs' corneal dystrophy, 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  . Insulin resistance 03/29/2018  . History of hysterectomy 03/29/2018  . Grade I diastolic dysfunction 54/65/0354  . Thyroid nodule 03/27/2018  . Presbycusis of both ears 03/10/2017  . Dysthymia 09/26/2016  . Anxiety, prn Klonopin 08/03/2016  . Laryngopharyngeal reflux (LPR) 07/26/2016  . Thyroid cancer ([place])   . GERD (gastroesophageal reflux disease)   . Restless leg syndrome, on Requip   . Allergic rhinitis 06/25/2009  . Diaphragmatic hernia 02/16/2009  . IBS (irritable bowel syndrome) 02/16/2009  . Hyperlipidemia with target LDL less than 130, on Lipitor 09/26/2008  . Celiac disease 11/27/2007  . Sleep disorder 07/05/2007  . Osteoarthritis 07/05/2007  . Osteopenia 10/03/2006  . Left carotid bruit 10/03/2006  . History of thyroid cancer 10/03/2006    Social History   Tobacco Use  . Smoking status: Never Smoker  . Smokeless tobacco: Never Used  Substance Use Topics  . Alcohol use: Yes    Alcohol/week: 8.0 standard drinks    Types: 7 Cans of beer, 1 Standard drinks or equivalent per week    Current Outpatient Medications:  .  APPLE CIDER VINEGAR PO, Take 1 capsule by mouth  2 (two) times daily. Reported on 06/09/2015, Disp: , Rfl:  .  aspirin EC 81 MG tablet, Take 81 mg by mouth daily., Disp: , Rfl:  .  atorvastatin (LIPITOR) 20 MG tablet, TAKE 1 TABLET BY MOUTH EVERY OTHER DAY, Disp: 45 tablet, Rfl: 1 .  bisacodyl (DULCOLAX) 5 MG EC  tablet, Take 5 mg by mouth daily as needed for moderate constipation., Disp: , Rfl:  .  Black Currant Seed Oil 500 MG CAPS, Take by mouth daily., Disp: , Rfl:  .  CALCIUM-VITAMIN D PO, Take 1 tablet by mouth daily. , Disp: , Rfl:  .  cholecalciferol (VITAMIN D3) 25 MCG (1000 UT) tablet, Take 1,000 Units by mouth daily. Take 2 in the am, Disp: , Rfl:  .  clonazePAM (KLONOPIN) 1 MG tablet, TAKE 1 TABLET BY MOUTH DAILY AS NEEDED FOR ANXIETY (Patient taking differently: Take 1/2 tablet nightly for sleep), Disp: 30 tablet, Rfl: 1 .  CO ENZYME Q-10 PO, Take by mouth daily. , Disp: , Rfl:  .  Cyanocobalamin (VITAMIN B-12 ER PO), Take 5,000 mcg by mouth daily., Disp: , Rfl:  .  diclofenac sodium (VOLTAREN) 1 % GEL, APPLY TOPICALLY TO AFFECTED AREA 4 TIMES A DAY, Disp: 100 g, Rfl: 1 .  ELDERBERRY PO, Take by mouth. Take one daily, Disp: , Rfl:  .  escitalopram (LEXAPRO) 10 MG tablet, Take 10 mg by mouth daily., Disp: , Rfl:  .  magnesium citrate SOLN, Take 1 Bottle by mouth as needed. , Disp: , Rfl:  .  Melatonin 3 MG CAPS, Take by mouth at bedtime., Disp: , Rfl:  .  metoprolol succinate (TOPROL-XL) 25 MG 24 hr tablet, Take 1-2 tablets (25-50 mg total) by mouth daily. Please call and schedule an appt for furhter refills. 1st attempt (Patient taking differently: Take 25 mg by mouth daily. ), Disp: 60 tablet, Rfl: 0 .  Multiple Vitamin (MULTIVITAMIN) tablet, Take 1 tablet by mouth daily.  , Disp: , Rfl:  .  NON FORMULARY, Bausch and lomb ointment-Muro-//128, 5%ointment- Each eye nightly, Disp: , Rfl:  .  OIL OF OREGANO PO, Take by mouth. Take one daily, Disp: , Rfl:  .  polyethylene glycol (MIRALAX / GLYCOLAX) packet, Take 17 g by mouth daily., Disp: , Rfl:  .  Propylene Glycol (SYSTANE BALANCE) 0.6 % SOLN, Apply 1 drop to eye 2 (two) times daily. For dry eyes, takes along with restasis, Disp: , Rfl:  .  rOPINIRole (REQUIP) 0.25 MG tablet, TAKE 1 TABLET (0.25 MG TOTAL) BY MOUTH AT BEDTIME., Disp: 90 tablet,  Rfl: 2 .  valACYclovir (VALTREX) 500 MG tablet, TAKE ONE TABLET TWICE A DAY AS NEEDED FOR OUTBREAKS., Disp: 180 tablet, Rfl: 1  Allergies  Allergen Reactions  . Ambien [Zolpidem Tartrate] Other (See Comments)    Causes Neurological problems with very bad dizziness, pains, disorientation  . Cortisone Other (See Comments)    REACTION: "mania" post ESI  . Eszopiclone And Related Other (See Comments)    Causes Neurological problems with very bad dizziness, pains, disorientation  . Levofloxacin Other (See Comments)    Muscle spasms and jerking movements  . Zolpidem Other (See Comments)    Causes Neurological problems with very bad dizziness, pains, disorientation  . Dicloxacillin Other (See Comments)    Stomach problems, swelling of tongue  . Dilantin [Phenytoin Sodium Extended] Hives and Itching  . Erythromycin Other (See Comments)    Tears stomach up  . Avelox [Moxifloxacin Hcl In Nacl]  Pt unsure of reaction.  . Other     Celiac disease  . Nitrofuran Derivatives Other (See Comments)    unknown  . Phenytoin Sodium Extended Rash  . Trazodone And Nefazodone Other (See Comments)    unknown    Objective:   VITALS: Per patient if applicable, see vitals. GENERAL: Alert, appears well and in no acute distress. HEENT: Atraumatic, conjunctiva clear, no obvious abnormalities on inspection of external nose and ears. NECK: Normal movements of the head and neck. CARDIOPULMONARY: No increased WOB. Speaking in clear sentences. I:E ratio WNL.  MS: Moves all visible extremities without noticeable abnormality. PSYCH: Pleasant and cooperative, well-groomed. Speech normal rate and rhythm. Affect is appropriate. Insight and judgement are appropriate. Attention is focused, linear, and appropriate.  NEURO: CN grossly intact. Oriented as arrived to appointment on time with no prompting. Moves both UE equally.  SKIN: No obvious lesions, wounds, erythema, or cyanosis noted on face or  hands.  Depression screen [facility] 2/9 11/12/2018 03/30/2018 03/30/2018  Decreased Interest 0 0 0  Down, Depressed, Hopeless 0 0 0  PHQ - 2 Score 0 0 0  Altered sleeping 0 3 0  Tired, decreased energy 0 3 0  Change in appetite 0 0 0  Feeling bad or failure about yourself  0 0 0  Trouble concentrating 0 0 0  Moving slowly or fidgety/restless 0 0 0  Suicidal thoughts 0 0 0  PHQ-9 Score 0 6 0  Difficult doing work/chores Not difficult at all Not difficult at all Not difficult at all  Some recent data might be hidden    Assessment and Plan:   There are no diagnoses linked to this encounter.  [name] COVID-19 Education: The signs and symptoms of COVID-19 were discussed with the patient and how to seek care for testing if needed. The importance of social distancing was discussed today. . Reviewed expectations re: course of current medical issues. . Discussed self-management of symptoms. . Outlined signs and symptoms indicating need for more acute intervention. . Patient verbalized understanding and all questions were answered. [name] Health Maintenance issues including appropriate healthy diet, exercise, and smoking avoidance were discussed with patient. . See orders for this visit as documented in the electronic medical record.  Briscoe Deutscher, DO  Records requested if needed. Time spent: 15 minutes, of which >50% was spent in obtaining information about her symptoms, reviewing her previous labs, evaluations, and treatments, counseling her about her condition (please see the discussed topics above), and developing a plan to further investigate it; she had a number of questions which I addressed.

## 2019-01-23 ENCOUNTER — Ambulatory Visit (INDEPENDENT_AMBULATORY_CARE_PROVIDER_SITE_OTHER): Payer: Medicare Other | Admitting: Family Medicine

## 2019-01-23 ENCOUNTER — Encounter: Payer: Self-pay | Admitting: Family Medicine

## 2019-01-23 VITALS — Temp 97.3°F | Ht 65.0 in | Wt 155.0 lb

## 2019-01-23 DIAGNOSIS — E785 Hyperlipidemia, unspecified: Secondary | ICD-10-CM | POA: Diagnosis not present

## 2019-02-05 ENCOUNTER — Other Ambulatory Visit: Payer: Self-pay

## 2019-02-05 ENCOUNTER — Encounter: Payer: Self-pay | Admitting: Family Medicine

## 2019-02-05 ENCOUNTER — Ambulatory Visit (INDEPENDENT_AMBULATORY_CARE_PROVIDER_SITE_OTHER): Payer: Medicare Other | Admitting: Family Medicine

## 2019-02-05 VITALS — Temp 97.3°F | Ht 65.0 in | Wt 155.0 lb

## 2019-02-05 DIAGNOSIS — G2581 Restless legs syndrome: Secondary | ICD-10-CM | POA: Diagnosis not present

## 2019-02-05 DIAGNOSIS — F5101 Primary insomnia: Secondary | ICD-10-CM

## 2019-02-05 DIAGNOSIS — B009 Herpesviral infection, unspecified: Secondary | ICD-10-CM

## 2019-02-05 DIAGNOSIS — E785 Hyperlipidemia, unspecified: Secondary | ICD-10-CM

## 2019-02-05 MED ORDER — VALACYCLOVIR HCL 500 MG PO TABS: ORAL_TABLET | ORAL | 1 refills | Status: DC

## 2019-02-05 MED ORDER — ROPINIROLE HCL 0.25 MG PO TABS: 0.2500 mg | ORAL_TABLET | Freq: Every day | ORAL | 2 refills | Status: DC

## 2019-02-05 MED ORDER — CLONAZEPAM 1 MG PO TABS: 1.0000 mg | ORAL_TABLET | Freq: Three times a day (TID) | ORAL | 1 refills | Status: DC | PRN

## 2019-02-05 MED ORDER — ATORVASTATIN CALCIUM 20 MG PO TABS: 20.0000 mg | ORAL_TABLET | ORAL | 1 refills | Status: DC

## 2019-02-05 NOTE — Progress Notes (Signed)
Virtual Visit via Video   Due to the COVID-19 pandemic, this visit was completed with telemedicine (audio/video) technology to reduce patient and provider exposure as well as to preserve personal protective equipment.   I connected with Mitra Duling Cullens by a video enabled telemedicine application and verified that I am speaking with the correct person using two identifiers. Location patient: Home Location provider: [facility], Office Persons participating in the virtual visit: MARYIAH OLVEY, Briscoe Deutscher, DO Lonell Grandchild, CMA acting as scribe for Dr. Briscoe Deutscher.   I discussed the limitations of evaluation and management by telemedicine and the availability of in person appointments. The patient expressed understanding and agreed to proceed.  Care Team   Patient Care Team: Briscoe Deutscher, DO as PCP - General (Family Medicine) Jarome Matin, MD as Consulting Physician (Dermatology)  Subjective:   HPI: Patient in for follow up and refills. She has had several life changes and had increased issues with sleeping. She would like to have Klonopin changed so that she can have 1/2 tab at bedtime and 1/2 tab if she wakes up in night.   Review of Systems  Constitutional: Negative for chills and fever.  HENT: Negative for hearing loss and tinnitus.   Eyes: Negative for blurred vision and double vision.  Respiratory: Negative for cough and wheezing.   Cardiovascular: Negative for chest pain and palpitations.  Gastrointestinal: Negative for nausea and vomiting.  Genitourinary: Negative for dysuria and urgency.  Neurological: Negative for dizziness and headaches.  Psychiatric/Behavioral: Negative for depression and suicidal ideas.    Patient Active Problem List   Diagnosis Date Noted  . 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 Osteoarthritis of left knee 07/13/2018  . Osteoarthritis of right knee 07/13/2018  . History of myasthenia gravis 04/01/2018  . Risk of  myocardial infarction or stroke 7.5% or greater in next 10 years 03/30/2018  . Mastalgia, left, followed by GYN 03/29/2018  . Fibromyalgia 03/29/2018  . Fuchs' corneal dystrophy, 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  . Insulin resistance 03/29/2018  . History of hysterectomy 03/29/2018  . Grade I diastolic dysfunction 35/70/1779  . Thyroid nodule 03/27/2018  . Presbycusis of both ears 03/10/2017  . Dysthymia 09/26/2016  . Anxiety, prn Klonopin 08/03/2016  . Laryngopharyngeal reflux (LPR) 07/26/2016  . Thyroid cancer ([place])   . GERD (gastroesophageal reflux disease)   . Restless leg syndrome, on Requip   . Allergic rhinitis 06/25/2009  . Diaphragmatic hernia 02/16/2009  . IBS (irritable bowel syndrome) 02/16/2009  . Hyperlipidemia with target LDL less than 130, on Lipitor 09/26/2008  . Celiac disease 11/27/2007  . Sleep disorder 07/05/2007  . Osteoarthritis 07/05/2007  . Osteopenia 10/03/2006  . Left carotid bruit 10/03/2006  . History of thyroid cancer 10/03/2006    Social History   Tobacco Use  . Smoking status: Never Smoker  . Smokeless tobacco: Never Used  Substance Use Topics  . Alcohol use: Yes    Alcohol/week: 8.0 standard drinks    Types: 7 Cans of beer, 1 Standard drinks or equivalent per week    Current Outpatient Medications:  .  APPLE CIDER VINEGAR PO, Take 1 capsule by mouth 2 (two) times daily. Reported on 06/09/2015, Disp: , Rfl:  .  aspirin EC 81 MG tablet, Take 81 mg by mouth daily., Disp: , Rfl:  .  atorvastatin (LIPITOR) 20 MG tablet, TAKE 1 TABLET BY MOUTH EVERY OTHER DAY, Disp: 45 tablet, Rfl: 1 .  bisacodyl (DULCOLAX) 5 MG EC tablet, Take 5  mg by mouth daily as needed for moderate constipation., Disp: , Rfl:  .  Black Currant Seed Oil 500 MG CAPS, Take by mouth daily., Disp: , Rfl:  .  CALCIUM-VITAMIN D PO, Take 1 tablet by mouth daily. , Disp: , Rfl:  .  cholecalciferol (VITAMIN D3) 25 MCG (1000 UT) tablet, Take 1,000 Units by mouth daily. Take 2 in the am,  Disp: , Rfl:  .  clonazePAM (KLONOPIN) 1 MG tablet, TAKE 1 TABLET BY MOUTH DAILY AS NEEDED FOR ANXIETY (Patient taking differently: Take 1/2 tablet nightly for sleep), Disp: 30 tablet, Rfl: 1 .  CO ENZYME Q-10 PO, Take by mouth daily. , Disp: , Rfl:  .  Cyanocobalamin (VITAMIN B-12 ER PO), Take 5,000 mcg by mouth daily., Disp: , Rfl:  .  diclofenac sodium (VOLTAREN) 1 % GEL, APPLY TOPICALLY TO AFFECTED AREA 4 TIMES A DAY, Disp: 100 g, Rfl: 1 .  ELDERBERRY PO, Take by mouth. Take one daily, Disp: , Rfl:  .  escitalopram (LEXAPRO) 10 MG tablet, Take 10 mg by mouth daily., Disp: , Rfl:  .  magnesium citrate SOLN, Take 1 Bottle by mouth as needed. , Disp: , Rfl:  .  Melatonin 3 MG CAPS, Take by mouth at bedtime., Disp: , Rfl:  .  metoprolol succinate (TOPROL-XL) 25 MG 24 hr tablet, Take 1-2 tablets (25-50 mg total) by mouth daily. Please call and schedule an appt for furhter refills. 1st attempt (Patient taking differently: Take 25 mg by mouth daily. ), Disp: 60 tablet, Rfl: 0 .  Multiple Vitamin (MULTIVITAMIN) tablet, Take 1 tablet by mouth daily.  , Disp: , Rfl:  .  NON FORMULARY, Bausch and lomb ointment-Muro-//128, 5%ointment- Each eye nightly, Disp: , Rfl:  .  OIL OF OREGANO PO, Take by mouth. Take one daily, Disp: , Rfl:  .  polyethylene glycol (MIRALAX / GLYCOLAX) packet, Take 17 g by mouth daily., Disp: , Rfl:  .  Propylene Glycol (SYSTANE BALANCE) 0.6 % SOLN, Apply 1 drop to eye 2 (two) times daily. For dry eyes, takes along with restasis, Disp: , Rfl:  .  rOPINIRole (REQUIP) 0.25 MG tablet, TAKE 1 TABLET (0.25 MG TOTAL) BY MOUTH AT BEDTIME., Disp: 90 tablet, Rfl: 2 .  valACYclovir (VALTREX) 500 MG tablet, TAKE ONE TABLET TWICE A DAY AS NEEDED FOR OUTBREAKS., Disp: 180 tablet, Rfl: 1  Allergies  Allergen Reactions  . Ambien [Zolpidem Tartrate] Other (See Comments)    Causes Neurological problems with very bad dizziness, pains, disorientation  . Cortisone Other (See Comments)    REACTION:  "mania" post ESI  . Eszopiclone And Related Other (See Comments)    Causes Neurological problems with very bad dizziness, pains, disorientation  . Levofloxacin Other (See Comments)    Muscle spasms and jerking movements  . Zolpidem Other (See Comments)    Causes Neurological problems with very bad dizziness, pains, disorientation  . Dicloxacillin Other (See Comments)    Stomach problems, swelling of tongue  . Dilantin [Phenytoin Sodium Extended] Hives and Itching  . Erythromycin Other (See Comments)    Tears stomach up  . Avelox [Moxifloxacin Hcl In Nacl]     Pt unsure of reaction.  . Other     Celiac disease  . Nitrofuran Derivatives Other (See Comments)    unknown  . Phenytoin Sodium Extended Rash  . Trazodone And Nefazodone Other (See Comments)    unknown    Objective:   VITALS: Per patient if applicable, see vitals. GENERAL: Alert,  appears well and in no acute distress. HEENT: Atraumatic, conjunctiva clear, no obvious abnormalities on inspection of external nose and ears. NECK: Normal movements of the head and neck. CARDIOPULMONARY: No increased WOB. Speaking in clear sentences. I:E ratio WNL.  MS: Moves all visible extremities without noticeable abnormality. PSYCH: Pleasant and cooperative, well-groomed. Speech normal rate and rhythm. Affect is appropriate. Insight and judgement are appropriate. Attention is focused, linear, and appropriate.  NEURO: CN grossly intact. Oriented as arrived to appointment on time with no prompting. Moves both UE equally.  SKIN: No obvious lesions, wounds, erythema, or cyanosis noted on face or hands.  Depression screen [facility] 2/9 11/12/2018 03/30/2018 03/30/2018  Decreased Interest 0 0 0  Down, Depressed, Hopeless 0 0 0  PHQ - 2 Score 0 0 0  Altered sleeping 0 3 0  Tired, decreased energy 0 3 0  Change in appetite 0 0 0  Feeling bad or failure about yourself  0 0 0  Trouble concentrating 0 0 0  Moving slowly or fidgety/restless 0 0 0    Suicidal thoughts 0 0 0  PHQ-9 Score 0 6 0  Difficult doing work/chores Not difficult at all Not difficult at all Not difficult at all  Some recent data might be hidden   Assessment and Plan:   Rhodie was seen today for follow-up.  Diagnoses and all orders for this visit:  Hyperlipidemia with target LDL less than 130, on Lipitor -     atorvastatin (LIPITOR) 20 MG tablet; Take 1 tablet (20 mg total) by mouth every other day.  Primary insomnia -     clonazePAM (KLONOPIN) 1 MG tablet; Take 1 tablet (1 mg total) by mouth 3 (three) times daily as needed. for anxiety  Restless leg syndrome -     rOPINIRole (REQUIP) 0.25 MG tablet; Take 1 tablet (0.25 mg total) by mouth at bedtime.  HSV infection -     valACYclovir (VALTREX) 500 MG tablet; One tab bid as needed for outbreak    . COVID-19 Education: The signs and symptoms of COVID-19 were discussed with the patient and how to seek care for testing if needed. The importance of social distancing was discussed today. . Reviewed expectations re: course of current medical issues. . Discussed self-management of symptoms. . Outlined signs and symptoms indicating need for more acute intervention. . Patient verbalized understanding and all questions were answered. [name] Health Maintenance issues including appropriate healthy diet, exercise, and smoking avoidance were discussed with patient. . See orders for this visit as documented in the electronic medical record.  Briscoe Deutscher, DO  Records requested if needed. Time spent: 15 minutes, of which >50% was spent in obtaining information about her symptoms, reviewing her previous labs, evaluations, and treatments, counseling her about her condition (please see the discussed topics above), and developing a plan to further investigate it; she had a number of questions which I addressed.

## 2019-02-06 ENCOUNTER — Encounter: Payer: Self-pay | Admitting: Family Medicine

## 2019-02-13 ENCOUNTER — Encounter: Payer: Self-pay | Admitting: Gynecology

## 2019-02-15 ENCOUNTER — Other Ambulatory Visit: Payer: Self-pay

## 2019-02-18 ENCOUNTER — Encounter: Payer: Self-pay | Admitting: Gynecology

## 2019-02-18 ENCOUNTER — Ambulatory Visit (INDEPENDENT_AMBULATORY_CARE_PROVIDER_SITE_OTHER): Payer: Medicare Other | Admitting: Gynecology

## 2019-02-18 ENCOUNTER — Other Ambulatory Visit: Payer: Self-pay

## 2019-02-18 ENCOUNTER — Ambulatory Visit: Payer: Medicare Other | Admitting: Gynecology

## 2019-02-18 VITALS — BP 118/76

## 2019-02-18 DIAGNOSIS — N644 Mastodynia: Secondary | ICD-10-CM

## 2019-02-18 DIAGNOSIS — D071 Carcinoma in situ of vulva: Secondary | ICD-10-CM

## 2019-02-18 NOTE — Progress Notes (Signed)
    Amanda [name] 1950/11/03 [id number]        68 y.o.  [id number] presents for follow-up colposcopy.  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  Patient also notes ongoing mastalgia primarily on the left although some on the right.  Also notes nipple tingling almost in a letdown type sensation but no galactorrhea.  Past medical history,surgical history, problem list, medications, allergies, family history and social history were all reviewed and documented in the EPIC chart.  Directed ROS with pertinent positives and negatives documented in the history of present illness/assessment and plan.  Exam: [name] assistant Vitals:   02/18/19 1402  BP: 118/76   General appearance:  Normal Both breasts examined lying and sitting without masses, retractions, discharge, adenopathy Pelvic external BUS vagina with atrophic changes.  No gross visible vulvar lesions.  Bimanual without masses or tenderness.  Colposcopy performed of the entire vulva after acetic acid cleanse with subtle white change in the lower left labia minora region of the prior biopsy.  Questionable scar tissue versus VIN.  Representative biopsy was taken after Betadine cleanse and 1% lidocaine infiltration.  Silver nitrate applied afterwards.  Physical Exam  Genitourinary:      Assessment/Plan:  68 y.o. [id number] with:  1. Persistent breast pain left greater than right with a tingling in the peri-nipple region on the left.  Recent mammography and ultrasound in July negative.  Exam today is negative.  Discussed various options for further evaluation and she would prefer to move towards MRI for complete evaluation.  We will go ahead and schedule this at her convenience. 2. 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  Colposcopy today shows subtle white change questionable VIN versus scar.  Representative biopsy taken.  It does involve a more expansive area and if excision is  required then I would recommend surgical center location as this will require more sutures.  Patient will follow-up on biopsy results.    [name], 2:29 PM 02/18/2019

## 2019-02-18 NOTE — Patient Instructions (Signed)
Office will call you to arrange the MRI of the breasts.  Office will call with biopsy results

## 2019-02-18 NOTE — Addendum Note (Signed)
Addended by: Anastasio Auerbach on: 02/18/2019 02:40 PM   Modules accepted: Orders

## 2019-02-19 ENCOUNTER — Ambulatory Visit: Payer: Medicare Other | Admitting: Adult Health

## 2019-02-20 ENCOUNTER — Other Ambulatory Visit: Payer: Self-pay

## 2019-02-20 ENCOUNTER — Telehealth: Payer: Self-pay | Admitting: *Deleted

## 2019-02-20 ENCOUNTER — Ambulatory Visit (INDEPENDENT_AMBULATORY_CARE_PROVIDER_SITE_OTHER): Payer: Medicare Other

## 2019-02-20 ENCOUNTER — Encounter: Payer: Self-pay | Admitting: Physician Assistant

## 2019-02-20 ENCOUNTER — Ambulatory Visit (INDEPENDENT_AMBULATORY_CARE_PROVIDER_SITE_OTHER): Payer: Medicare Other | Admitting: Physician Assistant

## 2019-02-20 VITALS — BP 118/90 | HR 62 | Temp 98.1°F | Ht 65.0 in | Wt 164.8 lb

## 2019-02-20 DIAGNOSIS — R0781 Pleurodynia: Secondary | ICD-10-CM | POA: Diagnosis not present

## 2019-02-20 DIAGNOSIS — N644 Mastodynia: Secondary | ICD-10-CM

## 2019-02-20 DIAGNOSIS — M79661 Pain in right lower leg: Secondary | ICD-10-CM | POA: Diagnosis not present

## 2019-02-20 LAB — PATHOLOGY REPORT

## 2019-02-20 LAB — TISSUE SPECIMEN

## 2019-02-20 NOTE — Progress Notes (Signed)
Amanda [name] is a 68 y.o. female here for a new problem.  History of Present Illness:   Chief Complaint  Patient presents with  . stabbing pain in right calf    x2 weeks  . pain in ribs and back    x1 month (aleve)    HPI  Right calf pain Patient reports that she has ongoing right calf pain, that is worsening with time.  She has a friend that she knows with history of DVT, so she has this in the back of her mind.  She denies: Chest pain, shortness of breath, lower leg swelling, numbness or tingling in leg, erythema, recent bedbound status, history of blood clot, lightheadedness.  She is very active, and was actually walking at the park prior to her appointment with [place] today.  Bilateral rib pain Has had tenderness to palpation to bilateral lateral lower ribs for about a month now.  She states that at times it is point tender.  When it is tender she describes as a sharp pain.  Denies: Painful inspiration, shortness of breath, chest pain.  She has a history of osteopenia and she is overdue for DEXA.  Past Medical History:  Diagnosis Date  . Anxiety   . Cataract    bil/had lens replaced  . Celiac disease   . GERD (gastroesophageal reflux disease)   . Hiatal hernia   . Hyperlipidemia   . Insomnia   . Irregular heartbeat   . Osteoarthrosis   . Osteopenia, T score -2.1 FRAX 9.4%/1.3% stable from prior DEXA 06/2015  . Restless leg syndrome   . STD (sexually transmitted disease), HSV   . 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 Thyroid cancer ([place])    parathyroid cancer/ left lobe removed  . Vertigo   . 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 Marital status: Married    Spouse name: Not on file  . Number of children: 2  . Years of education: Not on file  . Highest education level: Not on file  Occupational History  . Not on file  Social Needs  . Financial resource strain: Not on  file  . Food insecurity    Worry: Not on file    Inability: Not on file  . Transportation needs    Medical: Not on file    Non-medical: Not on file  Tobacco Use  . Smoking status: Never Smoker  . Smokeless tobacco: Never Used  Substance and Sexual Activity  . Alcohol use: Yes    Alcohol/week: 8.0 standard drinks    Types: 7 Cans of beer, 1 Standard drinks or equivalent per week  . Drug use: No  . Sexual activity: Yes    Partners: Male    Birth control/protection: Surgical, None    Comment: HYST-1st intercourse 68 yo-Fewer than 5 partners  Lifestyle  . Physical activity    Days per week: Not on file    Minutes per session: Not on file  . Stress: Not on file  Relationships  . Social Herbalist on phone: Not on file    Gets together: Not on file    Attends religious service: Not on file    Active member of club or organization: Not on file    Attends meetings of clubs or organizations: Not on file    Relationship status: Not on file  . Intimate partner violence  Fear of current or ex partner: Not on file    Emotionally abused: Not on file    Physically abused: Not on file    Forced sexual activity: Not on file  Other Topics Concern  . Not on file  Social History Narrative   Married mother of 2 with no grandchildren. Never smoked. Lives with husband in a three story home, no issue with stairs. Highest level of education is an associates. She exercises regularly, leads an active lifestyle, has a healthy diet, and is very socially engaged. Self-described "HSP: highly sensitive person" and therefore tends to worry more about symptoms than others might.    Past Surgical History:  Procedure Laterality Date  . ABDOMINAL HYSTERECTOMY  1986  . BREAST BIOPSY    . BREAST REDUCTION SURGERY  1999  . COLONOSCOPY    . ESOPHAGOGASTRODUODENOSCOPY    . KNEE SURGERY Bilateral 2002  . REDUCTION MAMMAPLASTY Bilateral   . SUBDURAL HEMATOMA EVACUATION VIA CRANIOTOMY  2010  .  THYROIDECTOMY, PARTIAL      Family History  Problem Relation Age of Onset  . Diabetes Maternal Aunt   . Breast cancer Maternal Aunt 60  . Liver cancer Father   . Heart disease Maternal Grandfather   . Hyperlipidemia Maternal Grandfather   . Hypertension Paternal Grandfather   . Colon cancer Neg Hx     Allergies  Allergen Reactions  . Ambien [Zolpidem Tartrate] Other (See Comments)    Causes Neurological problems with very bad dizziness, pains, disorientation  . Cortisone Other (See Comments)    REACTION: "mania" post ESI  . Eszopiclone And Related Other (See Comments)    Causes Neurological problems with very bad dizziness, pains, disorientation  . Levofloxacin Other (See Comments)    Muscle spasms and jerking movements  . Zolpidem Other (See Comments)    Causes Neurological problems with very bad dizziness, pains, disorientation  . Dicloxacillin Other (See Comments)    Stomach problems, swelling of tongue  . Dilantin [Phenytoin Sodium Extended] Hives and Itching  . Erythromycin Other (See Comments)    Tears stomach up  . Avelox [Moxifloxacin Hcl In Nacl]     Pt unsure of reaction.  . Other     Celiac disease  . Nitrofuran Derivatives Other (See Comments)    unknown  . Phenytoin Sodium Extended Rash  . Trazodone And Nefazodone Other (See Comments)    unknown    Current Medications:   Current Outpatient Medications:  .  APPLE CIDER VINEGAR PO, Take 1 capsule by mouth 2 (two) times daily. Reported on 06/09/2015, Disp: , Rfl:  .  aspirin EC 81 MG tablet, Take 81 mg by mouth daily., Disp: , Rfl:  .  atorvastatin (LIPITOR) 20 MG tablet, Take 1 tablet (20 mg total) by mouth every other day., Disp: 45 tablet, Rfl: 1 .  bisacodyl (DULCOLAX) 5 MG EC tablet, Take 5 mg by mouth daily as needed for moderate constipation., Disp: , Rfl:  .  Black Currant Seed Oil 500 MG CAPS, Take by mouth daily., Disp: , Rfl:  .  CALCIUM-VITAMIN D PO, Take 1 tablet by mouth daily. , Disp: , Rfl:   .  cholecalciferol (VITAMIN D3) 25 MCG (1000 UT) tablet, Take 1,000 Units by mouth daily. Take 2 in the am, Disp: , Rfl:  .  clonazePAM (KLONOPIN) 1 MG tablet, Take 1 tablet (1 mg total) by mouth 3 (three) times daily as needed. for anxiety, Disp: 90 tablet, Rfl: 1 .  CO ENZYME Q-10  PO, Take by mouth daily. , Disp: , Rfl:  .  Cyanocobalamin (VITAMIN B-12 ER PO), Take 5,000 mcg by mouth daily., Disp: , Rfl:  .  diclofenac sodium (VOLTAREN) 1 % GEL, APPLY TOPICALLY TO AFFECTED AREA 4 TIMES A DAY, Disp: 100 g, Rfl: 1 .  ELDERBERRY PO, Take by mouth. Take one daily, Disp: , Rfl:  .  escitalopram (LEXAPRO) 10 MG tablet, Take 10 mg by mouth daily., Disp: , Rfl:  .  magnesium citrate SOLN, Take 1 Bottle by mouth as needed. , Disp: , Rfl:  .  Melatonin 3 MG CAPS, Take by mouth at bedtime., Disp: , Rfl:  .  metoprolol succinate (TOPROL-XL) 25 MG 24 hr tablet, Take 1-2 tablets (25-50 mg total) by mouth daily. Please call and schedule an appt for furhter refills. 1st attempt (Patient taking differently: Take 25 mg by mouth daily. ), Disp: 60 tablet, Rfl: 0 .  Multiple Vitamin (MULTIVITAMIN) tablet, Take 1 tablet by mouth daily.  , Disp: , Rfl:  .  NON FORMULARY, Bausch and lomb ointment-Muro-//128, 5%ointment- Each eye nightly, Disp: , Rfl:  .  OIL OF OREGANO PO, Take by mouth. Take one daily, Disp: , Rfl:  .  polyethylene glycol (MIRALAX / GLYCOLAX) packet, Take 17 g by mouth daily., Disp: , Rfl:  .  Propylene Glycol (SYSTANE BALANCE) 0.6 % SOLN, Apply 1 drop to eye 2 (two) times daily. For dry eyes, takes along with restasis, Disp: , Rfl:  .  rOPINIRole (REQUIP) 0.25 MG tablet, Take 1 tablet (0.25 mg total) by mouth at bedtime., Disp: 90 tablet, Rfl: 2 .  valACYclovir (VALTREX) 500 MG tablet, One tab bid as needed for outbreak, Disp: 180 tablet, Rfl: 1   Review of Systems:   ROS  Negative unless otherwise specified per HPI.  Vitals:   Vitals:   02/20/19 1459  BP: 118/90  Pulse: 62  Temp: 98.1  F (36.7 C)  SpO2: 96%  Weight: 164 lb 12.8 oz (74.8 kg)  Height: 5' 5"  (1.651 m)     Body mass index is 27.42 kg/m.  Physical Exam:   Physical Exam Vitals signs and nursing note reviewed.  Constitutional:      General: She is not in acute distress.    Appearance: She is well-developed. She is not ill-appearing or toxic-appearing.  Cardiovascular:     Rate and Rhythm: Normal rate and regular rhythm.     Pulses: Normal pulses.          Dorsalis pedis pulses are 2+ on the right side and 2+ on the left side.     Heart sounds: Normal heart sounds, S1 normal and S2 normal.  Pulmonary:     Effort: Pulmonary effort is normal.     Breath sounds: Normal breath sounds.  Musculoskeletal:     Comments: Tenderness to palpation of right medial calf.  Negative Homans' sign.  No swelling, warmth or redness appreciated.  Tenderness to palpation of bilateral lateral lower ribs.  No skin changes.  Skin:    General: Skin is warm and dry.  Neurological:     Mental Status: She is alert.     GCS: GCS eye subscore is 4. GCS verbal subscore is 5. GCS motor subscore is 6.  Psychiatric:        Attention and Perception: Attention normal.        Mood and Affect: Mood is anxious.        Speech: Speech normal.  Behavior: Behavior normal. Behavior is cooperative.      Assessment and Plan:   Emanie was seen today for stabbing pain in right calf and pain in ribs and back.  Diagnoses and all orders for this visit:  Right calf pain Vitals reassuring. Low risk for DVT, however patient reassurance we will go ahead and pursue ultrasound imaging.  Discussed need for ER evaluation if any of her symptoms change overnight, including but not limited to: Chest pain, shortness of breath, palpitations.  Patient verbalized understanding to plan. -     VAS [place] LOWER EXTREMITY VENOUS (DVT); Future  Rib pain Unclear etiology at this time.  X-ray pending.  She is reluctant to take any anti-inflammatories  other than Aleve at this time.  If x-ray is normal, will consider sports medicine.  I did recommend that she get her DEXA updated, as she does have a history of osteopenia. -     DG Ribs Bilateral W/Chest; Future  . Reviewed expectations re: course of current medical issues. . Discussed self-management of symptoms. . Outlined signs and symptoms indicating need for more acute intervention. . Patient verbalized understanding and all questions were answered. . See orders for this visit as documented in the electronic medical record. . Patient received an After-Visit Summary.  Inda Coke, PA-C

## 2019-02-20 NOTE — Telephone Encounter (Signed)
-----   Message from Anastasio Auerbach, MD sent at 02/18/2019  3:37 PM EDT ----- Schedule bilateral breast MRI reference persistent bilateral breast pain left greater than right.  Left nipple discomfort and irritation.  Mammography and ultrasound negative.  Physical exam normal

## 2019-02-20 NOTE — Telephone Encounter (Addendum)
Order placed at [facility] for MRI breast, they will call patient to schedule. Also gave patient the number to call and schedule.

## 2019-02-20 NOTE — Patient Instructions (Signed)
It was great to see you!  Please proceed with ultrasound as scheduled.  If in the interim you develop any chest pain, shortness of breath, or other severe symptoms, please do not delay care and go to the ER.  I will be in touch with your rib x-ray as soon as the radiologist reads it.    Take care,  Inda Coke PA-C

## 2019-02-21 ENCOUNTER — Ambulatory Visit (HOSPITAL_COMMUNITY)
Admission: RE | Admit: 2019-02-21 | Discharge: 2019-02-21 | Disposition: A | Discharge status: Home / Self Care | Payer: Medicare Other | Source: Ambulatory Visit | Attending: Physician Assistant | Admitting: Physician Assistant

## 2019-02-21 ENCOUNTER — Encounter: Payer: Self-pay | Admitting: Gynecology

## 2019-02-21 ENCOUNTER — Ambulatory Visit (INDEPENDENT_AMBULATORY_CARE_PROVIDER_SITE_OTHER): Payer: Medicare Other | Admitting: Gynecology

## 2019-02-21 ENCOUNTER — Ambulatory Visit: Payer: Medicare Other | Admitting: Gynecology

## 2019-02-21 VITALS — BP 118/76

## 2019-02-21 DIAGNOSIS — M79661 Pain in right lower leg: Secondary | ICD-10-CM | POA: Diagnosis not present

## 2019-02-21 DIAGNOSIS — D071 Carcinoma in situ of vulva: Secondary | ICD-10-CM

## 2019-02-21 NOTE — Progress Notes (Addendum)
VASCULAR LAB PRELIMINARY  PRELIMINARY  PRELIMINARY  PRELIMINARY  Right lower extremity venous duplex completed.    Preliminary report:  See CV proc for preliminary results.  Gave report to Lawerance Sabal, Elizaveta Mattice, RVT 02/21/2019, 9:14 AM

## 2019-02-21 NOTE — Progress Notes (Signed)
    Amanda [name] [birth year] [id number]        68 y.o.  G2P2002 presents for reexamination.  Recent biopsy again shows VIN 3 with involved margins.  We had discussed at the time of the biopsy whether we would proceed with a wide local excision if the margins were involved and I wanted to relook to make sure there was a visible area to excise.  Past medical history,surgical history, problem list, medications, allergies, family history and social history were all reviewed and documented in the EPIC chart.  Directed ROS with pertinent positives and negatives documented in the history of present illness/assessment and plan.  Exam: [name] assistant Vitals:   02/21/19 1426  BP: 118/76   General appearance:  Normal External BUS vagina with faint white well-circumscribed area medial to the recent biopsy area.  Skin was subsequently cleansed with acetic acid and colposcopy confirmed and acetowhite area as outlined.  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  Remains with a clearly visible acetowhite area.  I reviewed dysplasia, high-grade/low-grade, progression/regression and the HPV association.  My recommendation would be to proceed with excision of this area.  We discussed possible involved margins and no guarantees that this will excise all of the dysplasia.  We also discussed the probable HPV association and risk of recurrence in the future.  I discussed with involved with the excision to include placement of sutures and need for healing.  Complications to include infection, wound breakdown, scarring with pain to include constant pain versus dyspareunia also discussed.  Patient wants to go ahead and proceed with the procedure and we will schedule her at her convenience.  She will read present right before for reinspection to make sure there is a definable area to excise.    [name], 3:09 PM 02/21/2019

## 2019-02-21 NOTE — Patient Instructions (Signed)
Office will call to schedule surgery

## 2019-02-25 ENCOUNTER — Ambulatory Visit: Payer: Medicare Other | Admitting: Family Medicine

## 2019-02-25 ENCOUNTER — Other Ambulatory Visit: Payer: Self-pay

## 2019-02-25 NOTE — Progress Notes (Signed)
Cardiology Office Note   Date:  02/27/2019   ID:  Amanda [name], DOB Jan 15, 1951, MRN [id number]  PCP:  Briscoe Deutscher, DO  Cardiologist:  Dr. Ellyn Hack  No chief complaint on file.    History of Present Illness: Amanda [name] is a 68 y.o. female who presents for ongoing assessment and management of palpitations with a history of hyperlipidemia.  She has not been seen by Dr. Ellyn Hack since 2018.  At that time the patient was doing well on low-dose metoprolol for palpitations.  She was going to the gym and staying hydrated.  No further testing was planned at the time of the last office visit.  Since last visit the patient has had issues with vulvar carcinoma and also with right lower quadrant abdominal pain.  She is due to have a transvaginal ultrasound and a local excision of carcinoma on March 12, 2019.  This will be completed by Dr. Phineas Real OB/GYN.  She has been noticing worsening palpitations since diagnosis which she believes is related to stress concerning the procedures and unknown results from testing.  She is very active walking at [employer] and [employer] with her husband daily.  She denies any chest pain, dyspnea on exertion, or significant fatigue.  Past Medical History:  Diagnosis Date  . Anxiety   . Cataract    bil/had lens replaced  . Celiac disease   . GERD (gastroesophageal reflux disease)   . Hiatal hernia   . Hyperlipidemia   . Insomnia   . Irregular heartbeat   . Osteoarthrosis   . Osteopenia, T score -2.1 FRAX 9.4%/1.3% stable from prior DEXA 06/2015  . Restless leg syndrome   . STD (sexually transmitted disease), HSV   . 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 Thyroid cancer ([place])    parathyroid cancer/ left lobe removed  . Vertigo   . 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 ABDOMINAL HYSTERECTOMY  1986  . BREAST BIOPSY    . BREAST  REDUCTION SURGERY  1999  . COLONOSCOPY    . ESOPHAGOGASTRODUODENOSCOPY    . KNEE SURGERY Bilateral 2002  . REDUCTION MAMMAPLASTY Bilateral   . SUBDURAL HEMATOMA EVACUATION VIA CRANIOTOMY  2010  . THYROIDECTOMY, PARTIAL       Current Outpatient Medications  Medication Sig Dispense Refill  . APPLE CIDER VINEGAR PO Take 1 capsule by mouth 2 (two) times daily. Reported on 06/09/2015    . aspirin EC 81 MG tablet Take 81 mg by mouth daily.    [name] atorvastatin (LIPITOR) 20 MG tablet Take 1 tablet (20 mg total) by mouth every other day. 45 tablet 1  . bisacodyl (DULCOLAX) 5 MG EC tablet Take 5 mg by mouth daily as needed for moderate constipation.    . Black Currant Seed Oil 500 MG CAPS Take by mouth daily.    [name] CALCIUM-VITAMIN D PO Take 1 tablet by mouth daily.     . cholecalciferol (VITAMIN D3) 25 MCG (1000 UT) tablet Take 1,000 Units by mouth daily. Take 2 in the am    . clonazePAM (KLONOPIN) 1 MG tablet Take 1 tablet (1 mg total) by mouth 3 (three) times daily as needed. for anxiety 90 tablet 1  . CO ENZYME Q-10 PO Take by mouth daily.     . Cyanocobalamin (VITAMIN B-12 ER PO) Take 5,000 mcg by mouth daily.    . diclofenac sodium (VOLTAREN) 1 %  GEL APPLY TOPICALLY TO AFFECTED AREA 4 TIMES A DAY 100 g 1  . ELDERBERRY PO Take by mouth. Take one daily    . escitalopram (LEXAPRO) 10 MG tablet Take 10 mg by mouth daily.    . magnesium citrate SOLN Take 1 Bottle by mouth as needed.     . Melatonin 3 MG CAPS Take by mouth at bedtime.    . metoprolol succinate (TOPROL-XL) 25 MG 24 hr tablet Take 1 tablet (25 mg total) by mouth daily. 90 tablet 3  . Multiple Vitamin (MULTIVITAMIN) tablet Take 1 tablet by mouth daily.      . NON FORMULARY Bausch and lomb ointment-Muro-//128, 5%ointment- Each eye nightly    . OIL OF OREGANO PO Take by mouth. Take one daily    . polyethylene glycol (MIRALAX / GLYCOLAX) packet Take 17 g by mouth daily.    [name] Propylene Glycol (SYSTANE BALANCE) 0.6 % SOLN Apply 1 drop to  eye 2 (two) times daily. For dry eyes, takes along with restasis    . rOPINIRole (REQUIP) 0.25 MG tablet Take 1 tablet (0.25 mg total) by mouth at bedtime. 90 tablet 2  . valACYclovir (VALTREX) 500 MG tablet One tab bid as needed for outbreak 180 tablet 1   No current facility-administered medications for this visit.     Allergies:   Ambien [zolpidem tartrate], Cortisone, Eszopiclone and related, Levofloxacin, Zolpidem, Dicloxacillin, Dilantin [phenytoin sodium extended], Erythromycin, Avelox [moxifloxacin hcl in nacl], Other, Nitrofuran derivatives, Phenytoin sodium extended, and Trazodone and nefazodone    Social History:  The patient  reports that she has never smoked. She has never used smokeless tobacco. She reports current alcohol use of about 8.0 standard drinks of alcohol per week. She reports that she does not use drugs.   Family History:  The patient's family history includes Breast cancer (age of onset: 72) in her maternal aunt; Diabetes in her maternal aunt; Heart disease in her maternal grandfather; Hyperlipidemia in her maternal grandfather; Hypertension in her paternal grandfather; Liver cancer in her father.    ROS: All other systems are reviewed and negative. Unless otherwise mentioned in H&P    PHYSICAL EXAM: VS:  BP (!) 142/70   Pulse 68   Temp 97.7 F (36.5 C)   Ht 5' 5"  (1.651 m)   Wt 161 lb (73 kg)   LMP  (LMP Unknown)   SpO2 95%   BMI 26.79 kg/m  , BMI Body mass index is 26.79 kg/m. GEN: Well nourished, well developed, in no acute distress HEENT: normal Neck: no JVD, carotid bruits, or masses Cardiac: RRR; no murmurs, rubs, or gallops,no edema  Respiratory:  Clear to auscultation bilaterally, normal work of breathing GI: soft, nontender, nondistended, + BS MS: no deformity or atrophy Skin: warm and dry, no rash Neuro:  Strength and sensation are intact Psych: euthymic mood, full affect    Recent Labs: 02/26/2019: ALT 20; BUN 18; Creat 0.82;  Hemoglobin 12.6; Platelets 227; Potassium 4.1; Sodium 140; TSH 1.88    Lipid Panel    Component Value Date/Time   CHOL 178 01/22/2019 1012   CHOL 315 (H) 05/15/2015 1136   TRIG 78.0 01/22/2019 1012   TRIG 92 05/15/2015 1136   TRIG 70 04/12/2006 1129   HDL 70.70 01/22/2019 1012   HDL 84 05/15/2015 1136   CHOLHDL 3 01/22/2019 1012   VLDL 15.6 01/22/2019 1012   LDLCALC 92 01/22/2019 1012   LDLCALC 213 (H) 05/15/2015 1136   LDLDIRECT 139.2 03/10/2008 1036  Wt Readings from Last 3 Encounters:  02/27/19 161 lb (73 kg)  02/20/19 164 lb 12.8 oz (74.8 kg)  02/05/19 155 lb (70.3 kg)      ASSESSMENT AND PLAN:  1.  Frequent palpitations: Multifactorial.  She has been having these for years but has noticed worsening symptoms due to drinking caffeine and some stress concerning upcoming procedures.  Will give her refills on her metoprolol which she is to take as directed 25 to 50 mg daily.  She is also on Lexapro, SSRI which can also contribute to cardiac arrhythmias.  These appear to be benign.  No further cardiac testing at this time.  2.  Recently diagnosed vulva cancer.  Scheduled for biopsy on March 12, 2019 by Dr. Phineas Real.  No cardiac restrictions to proceed with the surgery.   Current medicines are reviewed at length with the patient today.    Labs/ tests ordered today include: None  Phill Myron. [place] Pugh, ANP, AACC   02/27/2019 3:02 PM    [facility] [address] Office [phone] Fax [phone]  Notice: This dictation was prepared with Dragon dictation along with smaller phrase technology. Any transcriptional errors that result from this process are unintentional and may not be corrected upon review.

## 2019-02-25 NOTE — Telephone Encounter (Signed)
Patient scheduled on 03/13/19 @ 8:20am at [facility].

## 2019-02-26 ENCOUNTER — Encounter: Payer: Self-pay | Admitting: Gynecology

## 2019-02-26 ENCOUNTER — Ambulatory Visit (INDEPENDENT_AMBULATORY_CARE_PROVIDER_SITE_OTHER): Payer: Medicare Other | Admitting: Gynecology

## 2019-02-26 VITALS — BP 124/78

## 2019-02-26 DIAGNOSIS — R8290 Unspecified abnormal findings in urine: Secondary | ICD-10-CM

## 2019-02-26 DIAGNOSIS — R1031 Right lower quadrant pain: Secondary | ICD-10-CM | POA: Diagnosis not present

## 2019-02-26 DIAGNOSIS — G8929 Other chronic pain: Secondary | ICD-10-CM | POA: Diagnosis not present

## 2019-02-26 DIAGNOSIS — R5383 Other fatigue: Secondary | ICD-10-CM

## 2019-02-26 LAB — CULTURE INDICATED

## 2019-02-26 LAB — URINALYSIS, COMPLETE W/RFL CULTURE
Bacteria, UA: NONE SEEN /HPF
Bilirubin Urine: NEGATIVE
Glucose, UA: NEGATIVE
Hgb urine dipstick: NEGATIVE
Hyaline Cast: NONE SEEN /LPF
Ketones, ur: NEGATIVE
Leukocyte Esterase: NEGATIVE
Nitrites, Initial: NEGATIVE
Protein, ur: NEGATIVE
RBC / HPF: NONE SEEN /HPF (ref 0–2)
Specific Gravity, Urine: 1.02 (ref 1.001–1.03)
pH: 7.5 (ref 5.0–8.0)

## 2019-02-26 NOTE — Progress Notes (Signed)
    Amanda [name] June 23, 1950 [id number]        68 y.o.  G2P2002 presents with a several week history of foul odor to her urine.  No frequency dysuria urgency.  Having some mild low back pain.  No fever or chills.  Also notes over the last several days extreme fatigue.  She does have a history of chronic fatigue syndrome in the past.  Lastly notes some right pelvic/vaginal pain.  Did note that she went paddle boating several days ago.  Past medical history,surgical history, problem list, medications, allergies, family history and social history were all reviewed and documented in the EPIC chart.  Directed ROS with pertinent positives and negatives documented in the history of present illness/assessment and plan.  Exam: [name] assistant Vitals:   02/26/19 0913  BP: 124/78   General appearance:  Normal HEENT normal Lungs clear Cardiac regular rate no rubs murmurs or gallops Abdomen soft with mild tenderness right lower quadrant.  No masses guarding rebound or hernia. Pelvic external BUS vagina with atrophic changes.  Bimanual without masses or significant tenderness. Rectal exam normal  Assessment/Plan:  68 y.o. G2P2002 with:  1. Odor in her urine with no other UTI symptoms.  Urine analysis today is negative.  We will follow-up with culture for completeness. 2. Right lower quadrant vaginal discomfort.  Exam is negative although she does have some deep pressure tenderness on bimanual.  Recommend ultrasound to rule out ovarian process.  She is status post hysterectomy in the past but has retained her ovaries.  She will follow-up for this appointment. 3. Fatigue.  No localizing symptoms.  She does have a history of chronic fatigue.  Will check baseline CBC comprehensive metabolic panel and TSH.  Will follow up with her primary provider if continues. 4. Upcoming wide local excision of VIN 3/CIS.  I reviewed what involved with the procedure to include the expected intraoperative and  postoperative courses.  We discussed the risks to include damage to surrounding tissues, incomplete excision with residual VIN 3 requiring future treatments, chronic pain in the area to include constantly or with dyspareunia and wound breakdown and infection requiring prolonged antibiotics and possible retreatment/surgery for drainage or closure.  Patient's questions were answered to her satisfaction and she will be ready to proceed with surgery in the next several weeks as scheduled.    [name], 9:32 AM 02/26/2019

## 2019-02-26 NOTE — Patient Instructions (Signed)
Follow-up for the ultrasound as scheduled. 

## 2019-02-26 NOTE — H&P (Signed)
Amanda [name] 1950/05/18 [id number]   History and Physical  Chief complaint: VIN 3/CIS of the vulva  History of present illness: 68 y.o. G2P2002 with exam at recent annual showed raised white area left vulva.  Follow-up biopsy showed VIN 3/CIS.  Reexamination 2 months later showed a persistent area that was rebiopsied again showing VIN 3/CIS.  The patient is admitted for wide local excision of this area.  Past Medical History:  Diagnosis Date  . Anxiety   . Cataract    bil/had lens replaced  . Celiac disease   . GERD (gastroesophageal reflux disease)   . Hiatal hernia   . Hyperlipidemia   . Insomnia   . Irregular heartbeat   . Osteoarthrosis   . Osteopenia, T score -2.1 FRAX 9.4%/1.3% stable from prior DEXA 06/2015  . Restless leg syndrome   . STD (sexually transmitted disease), HSV   . 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 Vertigo   . 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 ABDOMINAL HYSTERECTOMY  1986  . BREAST BIOPSY    . BREAST REDUCTION SURGERY  1999  . COLONOSCOPY    . ESOPHAGOGASTRODUODENOSCOPY    . KNEE SURGERY Bilateral 2002  . REDUCTION MAMMAPLASTY Bilateral   . SUBDURAL HEMATOMA EVACUATION VIA CRANIOTOMY  2010  . THYROIDECTOMY, PARTIAL      Family History  Problem Relation Age of Onset  . Diabetes Maternal Aunt   . Breast cancer Maternal Aunt 60  . Liver cancer Father   . Heart disease Maternal Grandfather   . Hyperlipidemia Maternal Grandfather   . Hypertension Paternal Grandfather   . Colon cancer Neg Hx     Social History:  reports that she has never smoked. She has never used smokeless tobacco. She reports current alcohol use of about 8.0 standard drinks of alcohol per week. She reports that she does not use drugs.  Allergies:  Allergies  Allergen Reactions  . Ambien [Zolpidem  Tartrate] Other (See Comments)    Causes Neurological problems with very bad dizziness, pains, disorientation  . Cortisone Other (See Comments)    REACTION: "mania" post ESI  . Eszopiclone And Related Other (See Comments)    Causes Neurological problems with very bad dizziness, pains, disorientation  . Levofloxacin Other (See Comments)    Muscle spasms and jerking movements  . Zolpidem Other (See Comments)    Causes Neurological problems with very bad dizziness, pains, disorientation  . Dicloxacillin Other (See Comments)    Stomach problems, swelling of tongue  . Dilantin [Phenytoin Sodium Extended] Hives and Itching  . Erythromycin Other (See Comments)    Tears stomach up  . Avelox [Moxifloxacin Hcl In Nacl]     Pt unsure of reaction.  . Other     Celiac disease  . Nitrofuran Derivatives Other (See Comments)    unknown  . Phenytoin Sodium Extended Rash  . Trazodone And Nefazodone Other (See Comments)    unknown    Medications: See Epic for the most current list of medications.  ROS:  Was performed and pertinent positives and negatives are included in the history of present illness.  Exam: [name] assistant Vitals:   02/26/19 0913  BP: 124/78   General appearance:  Normal HEENT normal Lungs clear Cardiac regular rate no rubs murmurs or gallops Abdomen soft with mild tenderness right lower  quadrant.  No masses guarding rebound or hernia. Pelvic external BUS vagina with atrophic changes.  Bimanual without masses or significant tenderness. Rectal exam normal    Assessment/Plan:  68 y.o. G2P2002 with persistent VIN 3/CIS of the vulva.  Visible lesion remains left lower labia minora.  For wide local excision of this area.  I reviewed what is involved with the procedure to include the expected intraoperative and postoperative courses.  We discussed the risks to include damage to surrounding tissues, incomplete excision with residual VIN 3 requiring future treatments,  chronic pain in the area to include constantly or with dyspareunia and wound breakdown and infection requiring prolonged antibiotics and possible retreatment/surgery for drainage or closure.  Patient's questions were answered to her satisfaction    [name], 9:40 AM 02/26/2019

## 2019-02-27 ENCOUNTER — Other Ambulatory Visit: Payer: Self-pay

## 2019-02-27 ENCOUNTER — Ambulatory Visit (INDEPENDENT_AMBULATORY_CARE_PROVIDER_SITE_OTHER): Payer: Medicare Other | Admitting: Adult Health

## 2019-02-27 ENCOUNTER — Encounter: Payer: Self-pay | Admitting: Adult Health

## 2019-02-27 VITALS — BP 142/70 | HR 68 | Temp 97.7°F | Ht 65.0 in | Wt 161.0 lb

## 2019-02-27 DIAGNOSIS — C519 Malignant neoplasm of vulva, unspecified: Secondary | ICD-10-CM | POA: Diagnosis not present

## 2019-02-27 DIAGNOSIS — R002 Palpitations: Secondary | ICD-10-CM | POA: Diagnosis not present

## 2019-02-27 LAB — COMPREHENSIVE METABOLIC PANEL
AG Ratio: 2 (calc) (ref 1.0–2.5)
ALT: 20 U/L (ref 6–29)
AST: 20 U/L (ref 10–35)
Albumin: 4.7 g/dL (ref 3.6–5.1)
Alkaline phosphatase (APISO): 77 U/L (ref 37–153)
BUN: 18 mg/dL (ref 7–25)
CO2: 27 mmol/L (ref 20–32)
Calcium: 9.2 mg/dL (ref 8.6–10.4)
Chloride: 105 mmol/L (ref 98–110)
Creat: 0.82 mg/dL (ref 0.50–0.99)
Globulin: 2.4 g/dL (calc) (ref 1.9–3.7)
Glucose, Bld: 106 mg/dL — ABNORMAL HIGH (ref 65–99)
Potassium: 4.1 mmol/L (ref 3.5–5.3)
Sodium: 140 mmol/L (ref 135–146)
Total Bilirubin: 0.5 mg/dL (ref 0.2–1.2)
Total Protein: 7.1 g/dL (ref 6.1–8.1)

## 2019-02-27 LAB — CBC WITH DIFFERENTIAL/PLATELET
Absolute Monocytes: 348 cells/uL (ref 200–950)
Basophils Absolute: 42 cells/uL (ref 0–200)
Basophils Relative: 0.9 %
Eosinophils Absolute: 221 cells/uL (ref 15–500)
Eosinophils Relative: 4.7 %
HCT: 37.5 % (ref 35.0–45.0)
Hemoglobin: 12.6 g/dL (ref 11.7–15.5)
Lymphs Abs: 1152 cells/uL (ref 850–3900)
MCH: 30.7 pg (ref 27.0–33.0)
MCHC: 33.6 g/dL (ref 32.0–36.0)
MCV: 91.5 fL (ref 80.0–100.0)
MPV: 11.3 fL (ref 7.5–12.5)
Monocytes Relative: 7.4 %
Neutro Abs: 2938 cells/uL (ref 1500–7800)
Neutrophils Relative %: 62.5 %
Platelets: 227 10*3/uL (ref 140–400)
RBC: 4.1 10*6/uL (ref 3.80–5.10)
RDW: 11.8 % (ref 11.0–15.0)
Total Lymphocyte: 24.5 %
WBC: 4.7 10*3/uL (ref 3.8–10.8)

## 2019-02-27 LAB — TSH: TSH: 1.88 mIU/L (ref 0.40–4.50)

## 2019-02-27 MED ORDER — METOPROLOL SUCCINATE ER 25 MG PO TB24: 25.0000 mg | ORAL_TABLET | Freq: Every day | ORAL | 3 refills | Status: DC

## 2019-02-27 NOTE — Patient Instructions (Signed)
Medication Instructions:  Continue current medications  If you need a refill on your cardiac medications before your next appointment, please call your pharmacy.  Labwork: None Ordered   Testing/Procedures: None Ordered  Follow-Up: IN 12 months Please call our office 2 months in advance,  to schedule this 1 Year appointment. In Person Glenetta Hew, MD.    At [facility], you and your health needs are our priority.  As part of our continuing mission to provide you with exceptional heart care, we have created designated Provider Care Teams.  These Care Teams include your primary Cardiologist (physician) and Advanced Practice Providers (APPs -  Physician Assistants and Nurse Practitioners) who all work together to provide you with the care you need, when you need it.  Thank you for choosing [facility] at [facility]!!

## 2019-02-28 ENCOUNTER — Other Ambulatory Visit: Payer: Self-pay | Admitting: Family Medicine

## 2019-02-28 DIAGNOSIS — Z85828 Personal history of other malignant neoplasm of skin: Secondary | ICD-10-CM | POA: Diagnosis not present

## 2019-02-28 DIAGNOSIS — E785 Hyperlipidemia, unspecified: Secondary | ICD-10-CM

## 2019-02-28 DIAGNOSIS — L821 Other seborrheic keratosis: Secondary | ICD-10-CM | POA: Diagnosis not present

## 2019-02-28 DIAGNOSIS — L820 Inflamed seborrheic keratosis: Secondary | ICD-10-CM | POA: Diagnosis not present

## 2019-03-01 ENCOUNTER — Other Ambulatory Visit: Payer: Self-pay

## 2019-03-04 ENCOUNTER — Ambulatory Visit (INDEPENDENT_AMBULATORY_CARE_PROVIDER_SITE_OTHER): Payer: Medicare Other

## 2019-03-04 ENCOUNTER — Ambulatory Visit (INDEPENDENT_AMBULATORY_CARE_PROVIDER_SITE_OTHER): Payer: Medicare Other | Admitting: Gynecology

## 2019-03-04 ENCOUNTER — Other Ambulatory Visit: Payer: Self-pay

## 2019-03-04 ENCOUNTER — Encounter: Payer: Self-pay | Admitting: Gynecology

## 2019-03-04 VITALS — BP 118/74

## 2019-03-04 DIAGNOSIS — G8929 Other chronic pain: Secondary | ICD-10-CM | POA: Diagnosis not present

## 2019-03-04 DIAGNOSIS — N83201 Unspecified ovarian cyst, right side: Secondary | ICD-10-CM

## 2019-03-04 DIAGNOSIS — R1031 Right lower quadrant pain: Secondary | ICD-10-CM | POA: Diagnosis not present

## 2019-03-04 DIAGNOSIS — R102 Pelvic and perineal pain: Secondary | ICD-10-CM

## 2019-03-04 NOTE — Patient Instructions (Signed)
Follow-up for your surgery as scheduled

## 2019-03-04 NOTE — Progress Notes (Signed)
    Amanda [name] 05/24/1950 [id number]        67 y.o.  G2P2002 presents for ultrasound due to some right lower quadrant/vaginal discomfort.  Past medical history,surgical history, problem list, medications, allergies, family history and social history were all reviewed and documented in the EPIC chart.  Directed ROS with pertinent positives and negatives documented in the history of present illness/assessment and plan.  Exam: Vitals:   03/04/19 1111  BP: 118/74   General appearance:  Normal  Ultrasound transvaginal status post hysterectomy.  Vaginal cuff appears normal.  Left ovary small and atrophic in appearance.  Right ovary with 13 x 12 mm thin-walled avascular cyst essentially unchanged from her prior studies.  No free fluid noted in the pelvis.  Assessment/Plan:  68 y.o. G5O0370 with negative vaginal ultrasound.  I suspect her right lower quadrant/pelvic pain is GI.  She does note issues with sluggish motility.  She is going to get on a daily fiber supplement and see if this does not help.  Will ultimately follow-up with GI if her discomfort continues.  She will follow-up for her planned wide local excision of her VIN 3.    [name], 11:31 AM 03/04/2019

## 2019-03-06 ENCOUNTER — Ambulatory Visit: Payer: Medicare Other | Admitting: Gynecology

## 2019-03-08 ENCOUNTER — Other Ambulatory Visit (HOSPITAL_COMMUNITY)
Admission: RE | Admit: 2019-03-08 | Discharge: 2019-03-08 | Disposition: A | Discharge status: Home / Self Care | Payer: Medicare Other | Source: Ambulatory Visit | Attending: Gynecology | Admitting: Gynecology

## 2019-03-08 DIAGNOSIS — Z01812 Encounter for preprocedural laboratory examination: Secondary | ICD-10-CM | POA: Diagnosis not present

## 2019-03-08 DIAGNOSIS — Z20828 Contact with and (suspected) exposure to other viral communicable diseases: Secondary | ICD-10-CM | POA: Insufficient documentation

## 2019-03-11 ENCOUNTER — Other Ambulatory Visit: Payer: Self-pay

## 2019-03-11 ENCOUNTER — Encounter (HOSPITAL_BASED_OUTPATIENT_CLINIC_OR_DEPARTMENT_OTHER): Payer: Self-pay | Admitting: *Deleted

## 2019-03-11 LAB — NOVEL CORONAVIRUS, NAA (HOSP ORDER, SEND-OUT TO REF LAB; TAT 18-24 HRS): SARS-CoV-2, NAA: NOT DETECTED

## 2019-03-11 NOTE — Progress Notes (Signed)
Spoke w/ via phone for pre-op interview--- PT Lab results------ CBCdiff, CMP done 02-26-2019 and EKG current in chart/ epic COVID test ------ 03-08-2019 Arrive at ------- 0830 NPO after ------ MN Medications to take morning of surgery ----- NONE Diabetic medication ----- n/a Patient Special Instructions ----- n/a Pre-Op special Istructions ----- n/a Patient verbalized understanding of instructions that were given at this phone interview. Patient denies shortness of breath, chest pain, fever, cough a this phone interview.   Anesthesia:  Per pt very anxious about surgery and thinks it's causing extra palpitations. Per cardiology note 02-27-2019 pt is stable, ekg normal.  PCP:  Dr Briscoe Deutscher Cardiologist :  Dr Ellyn Hack for palpitations (lov 02-27-2019 epic) Chest x-ray :  04-10-2018 epic EKG :  02-27-2019 epic Echo :  09-12-204  Epic Stress test:  Nuclear 03-16-2011  epic Cardiac Cath :  NO Sleep Study/ CPAP :  NO  Blood Thinner/ Instructions Maryjane Hurter Dose: NO ASA / Instructions/ Last Dose :  ASA 2m/ per pt told by office to stop/  Last dose past week

## 2019-03-12 ENCOUNTER — Encounter (HOSPITAL_BASED_OUTPATIENT_CLINIC_OR_DEPARTMENT_OTHER)
Admission: RE | Disposition: A | Discharge status: Home / Self Care | Payer: Self-pay | Source: Home / Self Care | Attending: Gynecology

## 2019-03-12 ENCOUNTER — Ambulatory Visit (HOSPITAL_BASED_OUTPATIENT_CLINIC_OR_DEPARTMENT_OTHER): Payer: Medicare Other | Admitting: Anesthesiology

## 2019-03-12 ENCOUNTER — Ambulatory Visit (HOSPITAL_BASED_OUTPATIENT_CLINIC_OR_DEPARTMENT_OTHER)
Admission: RE | Admit: 2019-03-12 | Discharge: 2019-03-12 | Disposition: A | Discharge status: Home / Self Care | Payer: Medicare Other | Attending: Gynecology | Admitting: Gynecology

## 2019-03-12 ENCOUNTER — Encounter (HOSPITAL_BASED_OUTPATIENT_CLINIC_OR_DEPARTMENT_OTHER): Payer: Self-pay

## 2019-03-12 DIAGNOSIS — Z85850 Personal history of malignant neoplasm of thyroid: Secondary | ICD-10-CM | POA: Insufficient documentation

## 2019-03-12 DIAGNOSIS — D071 Carcinoma in situ of vulva: Secondary | ICD-10-CM | POA: Diagnosis not present

## 2019-03-12 DIAGNOSIS — M797 Fibromyalgia: Secondary | ICD-10-CM | POA: Insufficient documentation

## 2019-03-12 DIAGNOSIS — Z881 Allergy status to other antibiotic agents status: Secondary | ICD-10-CM | POA: Diagnosis not present

## 2019-03-12 DIAGNOSIS — Z880 Allergy status to penicillin: Secondary | ICD-10-CM | POA: Insufficient documentation

## 2019-03-12 DIAGNOSIS — Z888 Allergy status to other drugs, medicaments and biological substances status: Secondary | ICD-10-CM | POA: Insufficient documentation

## 2019-03-12 DIAGNOSIS — E890 Postprocedural hypothyroidism: Secondary | ICD-10-CM | POA: Diagnosis not present

## 2019-03-12 DIAGNOSIS — C511 Malignant neoplasm of labium minus: Secondary | ICD-10-CM | POA: Diagnosis not present

## 2019-03-12 DIAGNOSIS — R102 Pelvic and perineal pain: Secondary | ICD-10-CM | POA: Diagnosis not present

## 2019-03-12 DIAGNOSIS — N905 Atrophy of vulva: Secondary | ICD-10-CM | POA: Diagnosis not present

## 2019-03-12 DIAGNOSIS — K589 Irritable bowel syndrome without diarrhea: Secondary | ICD-10-CM | POA: Diagnosis not present

## 2019-03-12 DIAGNOSIS — E041 Nontoxic single thyroid nodule: Secondary | ICD-10-CM | POA: Diagnosis not present

## 2019-03-12 DIAGNOSIS — E785 Hyperlipidemia, unspecified: Secondary | ICD-10-CM | POA: Diagnosis not present

## 2019-03-12 HISTORY — DX: Other constipation: K59.09

## 2019-03-12 HISTORY — DX: Personal history of other diseases of the circulatory system: Z86.79

## 2019-03-12 HISTORY — DX: Presence of spectacles and contact lenses: Z97.3

## 2019-03-12 HISTORY — DX: Nontoxic single thyroid nodule: E04.1

## 2019-03-12 HISTORY — PX: VULVECTOMY: SHX1086

## 2019-03-12 HISTORY — DX: Personal history of malignant neoplasm of thyroid: Z85.850

## 2019-03-12 HISTORY — DX: Unspecified osteoarthritis, unspecified site: M19.90

## 2019-03-12 HISTORY — DX: Palpitations: R00.2

## 2019-03-12 SURGERY — WIDE EXCISION VULVECTOMY
Anesthesia: General | Site: Vulva

## 2019-03-12 MED ORDER — FENTANYL CITRATE (PF) 100 MCG/2ML IJ SOLN
INTRAMUSCULAR | Status: AC
  Filled 2019-03-12: qty 2

## 2019-03-12 MED ORDER — FENTANYL CITRATE (PF) 100 MCG/2ML IJ SOLN
INTRAMUSCULAR | Status: DC | PRN
  Administered 2019-03-12 (×2): 25 ug via INTRAVENOUS

## 2019-03-12 MED ORDER — ACETIC ACID 5 % SOLN
Status: DC | PRN
  Administered 2019-03-12: 1 via TOPICAL

## 2019-03-12 MED ORDER — ONDANSETRON HCL 4 MG/2ML IJ SOLN
INTRAMUSCULAR | Status: DC | PRN
  Administered 2019-03-12: 4 mg via INTRAVENOUS

## 2019-03-12 MED ORDER — LIDOCAINE 2% (20 MG/ML) 5 ML SYRINGE
INTRAMUSCULAR | Status: AC
  Filled 2019-03-12: qty 5

## 2019-03-12 MED ORDER — LIDOCAINE HCL (CARDIAC) PF 100 MG/5ML IV SOSY
PREFILLED_SYRINGE | INTRAVENOUS | Status: DC | PRN
  Administered 2019-03-12: 40 mg via INTRAVENOUS

## 2019-03-12 MED ORDER — METRONIDAZOLE IN NACL 5-0.79 MG/ML-% IV SOLN
500.0000 mg | INTRAVENOUS | Status: DC
  Filled 2019-03-12: qty 100

## 2019-03-12 MED ORDER — ACETAMINOPHEN 500 MG PO TABS
1000.0000 mg | ORAL_TABLET | Freq: Once | ORAL | Status: AC
  Administered 2019-03-12: 1000 mg via ORAL
  Filled 2019-03-12: qty 2

## 2019-03-12 MED ORDER — FENTANYL CITRATE (PF) 100 MCG/2ML IJ SOLN
25.0000 ug | INTRAMUSCULAR | Status: DC | PRN
  Filled 2019-03-12: qty 1

## 2019-03-12 MED ORDER — PROPOFOL 10 MG/ML IV BOLUS
INTRAVENOUS | Status: AC
  Filled 2019-03-12: qty 40

## 2019-03-12 MED ORDER — METRONIDAZOLE IN NACL 5-0.79 MG/ML-% IV SOLN
500.0000 mg | INTRAVENOUS | Status: AC
  Administered 2019-03-12: 10:00:00 500 mg via INTRAVENOUS
  Filled 2019-03-12: qty 100

## 2019-03-12 MED ORDER — FAMOTIDINE IN NACL 20-0.9 MG/50ML-% IV SOLN
INTRAVENOUS | Status: AC
  Filled 2019-03-12: qty 50

## 2019-03-12 MED ORDER — PROPOFOL 10 MG/ML IV BOLUS
INTRAVENOUS | Status: DC | PRN
  Administered 2019-03-12: 100 mg via INTRAVENOUS
  Administered 2019-03-12: 40 mg via INTRAVENOUS

## 2019-03-12 MED ORDER — LACTATED RINGERS IV SOLN
INTRAVENOUS | Status: DC
  Administered 2019-03-12: 09:00:00 via INTRAVENOUS
  Filled 2019-03-12: qty 1000

## 2019-03-12 MED ORDER — METRONIDAZOLE IN NACL 5-0.79 MG/ML-% IV SOLN
INTRAVENOUS | Status: AC
  Filled 2019-03-12: qty 100

## 2019-03-12 MED ORDER — GENTAMICIN SULFATE 40 MG/ML IJ SOLN
5.0000 mg/kg | INTRAVENOUS | Status: DC
  Filled 2019-03-12: qty 9.25

## 2019-03-12 MED ORDER — GENTAMICIN SULFATE 40 MG/ML IJ SOLN
5.0000 mg/kg | INTRAVENOUS | Status: AC
  Administered 2019-03-12: 351.6 mg via INTRAVENOUS
  Filled 2019-03-12: qty 8.75

## 2019-03-12 MED ORDER — FENTANYL CITRATE (PF) 250 MCG/5ML IJ SOLN
INTRAMUSCULAR | Status: AC
  Filled 2019-03-12: qty 5

## 2019-03-12 MED ORDER — FAMOTIDINE IN NACL 20-0.9 MG/50ML-% IV SOLN
20.0000 mg | Freq: Once | INTRAVENOUS | Status: AC
  Administered 2019-03-12: 10:00:00 20 mg via INTRAVENOUS
  Filled 2019-03-12: qty 50

## 2019-03-12 MED ORDER — BUPIVACAINE HCL (PF) 0.25 % IJ SOLN
INTRAMUSCULAR | Status: DC | PRN
  Administered 2019-03-12: 1 mL

## 2019-03-12 MED ORDER — FAMOTIDINE 20 MG PO TABS
ORAL_TABLET | ORAL | Status: AC
  Filled 2019-03-12: qty 1

## 2019-03-12 MED ORDER — ONDANSETRON HCL 4 MG/2ML IJ SOLN
INTRAMUSCULAR | Status: AC
  Filled 2019-03-12: qty 2

## 2019-03-12 MED ORDER — ACETAMINOPHEN 500 MG PO TABS
ORAL_TABLET | ORAL | Status: AC
  Filled 2019-03-12: qty 2

## 2019-03-12 SURGICAL SUPPLY — 38 items
BENZOIN TINCTURE PRP APPL 2/3 (GAUZE/BANDAGES/DRESSINGS) IMPLANT
BLADE SURG 15 STRL LF DISP TIS (BLADE) ×1 IMPLANT
BLADE SURG 15 STRL SS (BLADE) ×1
CANISTER SUCT 3000ML PPV (MISCELLANEOUS) IMPLANT
CANISTER SUCTION 1200CC (MISCELLANEOUS) IMPLANT
CLOTH BEACON ORANGE TIMEOUT ST (SAFETY) ×2 IMPLANT
COVER BACK TABLE 60X90IN (DRAPES) ×2 IMPLANT
COVER WAND RF STERILE (DRAPES) ×4 IMPLANT
DRAPE HYSTEROSCOPY (DRAPE) ×2 IMPLANT
DRAPE SHEET LG 3/4 BI-LAMINATE (DRAPES) ×2 IMPLANT
ELECT NDL TIP 2.8 STRL (NEEDLE) IMPLANT
ELECT NEEDLE TIP 2.8 STRL (NEEDLE) IMPLANT
ELECT REM PT RETURN 9FT ADLT (ELECTROSURGICAL) ×2
ELECTRODE REM PT RTRN 9FT ADLT (ELECTROSURGICAL) ×1 IMPLANT
GLOVE BIO SURGEON STRL SZ7.5 (GLOVE) ×2 IMPLANT
GOWN W/COTTON TOWEL STD LRG (GOWNS) ×2 IMPLANT
GOWN XL W/COTTON TOWEL STD (GOWNS) ×2 IMPLANT
KIT TURNOVER CYSTO (KITS) ×2 IMPLANT
LEGGING LITHOTOMY PAIR STRL (DRAPES) ×1 IMPLANT
MANIFOLD NEPTUNE II (INSTRUMENTS) IMPLANT
NDL HYPO 25X1 1.5 SAFETY (NEEDLE) IMPLANT
NEEDLE HYPO 25X1 1.5 SAFETY (NEEDLE) IMPLANT
NS IRRIG 500ML POUR BTL (IV SOLUTION) ×1 IMPLANT
PACK BASIN DAY SURGERY FS (CUSTOM PROCEDURE TRAY) ×1 IMPLANT
PAD OB MATERNITY 4.3X12.25 (PERSONAL CARE ITEMS) ×2 IMPLANT
PAD PREP 24X48 CUFFED NSTRL (MISCELLANEOUS) ×2 IMPLANT
PENCIL BUTTON HOLSTER BLD 10FT (ELECTRODE) ×2 IMPLANT
STRIP CLOSURE SKIN 1/4X4 (GAUZE/BANDAGES/DRESSINGS) IMPLANT
SUT MNCRL AB 3-0 PS2 18 (SUTURE) IMPLANT
SUT PLAIN 3 0 FS 2 27 (SUTURE) IMPLANT
SWAB COLLECTION DEVICE MRSA (MISCELLANEOUS) IMPLANT
SYR BULB IRRIGATION 50ML (SYRINGE) IMPLANT
TOWEL OR 17X26 10 PK STRL BLUE (TOWEL DISPOSABLE) ×4 IMPLANT
TRAY DSU PREP LF (CUSTOM PROCEDURE TRAY) ×2 IMPLANT
TUBE ANAEROBIC SPECIMEN COL (MISCELLANEOUS) IMPLANT
TUBE CONNECTING 12X1/4 (SUCTIONS) IMPLANT
WATER STERILE IRR 500ML POUR (IV SOLUTION) ×1 IMPLANT
YANKAUER SUCT BULB TIP NO VENT (SUCTIONS) IMPLANT

## 2019-03-12 NOTE — Anesthesia Postprocedure Evaluation (Signed)
Anesthesia Post Note  Patient: Amanda [name]  Procedure(s) Performed: WIDE LOCAL EXCISION OF CARCINOMA IS SITU OF VULVA (N/A Vulva)     Patient location during evaluation: PACU Anesthesia Type: General Level of consciousness: awake and alert Pain management: pain level controlled Vital Signs Assessment: post-procedure vital signs reviewed and stable Respiratory status: spontaneous breathing, nonlabored ventilation, respiratory function stable and patient connected to nasal cannula oxygen Cardiovascular status: blood pressure returned to baseline and stable Postop Assessment: no apparent nausea or vomiting Anesthetic complications: no    Last Vitals:  Vitals:   03/12/19 1100 03/12/19 1200  BP: 135/76 134/89  Pulse: 71 62  Resp: 11 12  Temp: 36.6 C 36.4 C  SpO2: 97%     Last Pain:  Vitals:   03/12/19 1200  TempSrc:   PainSc: 0-No pain                 [name] L Neaveh Belanger

## 2019-03-12 NOTE — H&P (Signed)
The patient was examined.  I reviewed the proposed surgery and consent form with the patient.  The dictated history and physical is current and accurate and all questions were answered. The patient is ready to proceed with surgery and has a realistic understanding and expectation for the outcome.   [name], 9:25 AM 03/12/2019

## 2019-03-12 NOTE — Discharge Instructions (Signed)
°  Post Anesthesia Home Care Instructions  Activity: Get plenty of rest for the remainder of the day. A responsible individual must stay with you for 24 hours following the procedure.  For the next 24 hours, DO NOT: -Drive a car -Paediatric nurse -Drink alcoholic beverages -Take any medication unless instructed by your physician -Make any legal decisions or sign important papers.  Meals: Start with liquid foods such as gelatin or soup. Progress to regular foods as tolerated. Avoid greasy, spicy, heavy foods. If nausea and/or vomiting occur, drink only clear liquids until the nausea and/or vomiting subsides. Call your physician if vomiting continues.  Special Instructions/Symptoms: Your throat may feel dry or sore from the anesthesia or the breathing tube placed in your throat during surgery. If this causes discomfort, gargle with warm salt water. The discomfort should disappear within 24 hours.  If you had a scopolamine patch placed behind your ear for the management of post- operative nausea and/or vomiting:  1. The medication in the patch is effective for 72 hours, after which it should be removed.  Wrap patch in a tissue and discard in the trash. Wash hands thoroughly with soap and water. 2. You may remove the patch earlier than 72 hours if you experience unpleasant side effects which may include dry mouth, dizziness or visual disturbances. 3. Avoid touching the patch. Wash your hands with soap and water after contact with the patch.      Postoperative Instructions   Dr. Phineas Real and the nursing staff have discussed postoperative instructions with you.  If you have any questions please ask them before you leave the hospital, or call Dr Elisabeth Most office at [phone].    We would like to emphasize the following instructions:   ? You may eat a regular diet, but slowly to make sure that you do not have nausea or vomiting  ? Drink plenty of water daily.  ? Nothing in the vagina  (intercourse, douching, objects of any kind) for two weeks.  When reinitiating intercourse, if it is uncomfortable, stop and make an appointment with Dr Phineas Real to be evaluated.  ? No driving for one to two days until the effects of anesthesia has worn off.  No traveling out of town for several days.  ? Rest frequently, listen to your body and do not push yourself and overdo it.  ? Call if:  o Your pain medication does not seem strong enough. o Persistent nausea or vomiting o Difficulty with urination or bowel movements. o Temperature of 101 degrees or higher. o You have any questions or concerns

## 2019-03-12 NOTE — Anesthesia Procedure Notes (Signed)
Procedure Name: LMA Insertion Date/Time: 03/12/2019 9:48 AM Performed by: Georgeanne Nim, CRNA Pre-anesthesia Checklist: Patient identified, Emergency Drugs available, Suction available, Patient being monitored and Timeout performed Patient Re-evaluated:Patient Re-evaluated prior to induction Oxygen Delivery Method: Circle system utilized Preoxygenation: Pre-oxygenation with 100% oxygen Induction Type: IV induction Ventilation: Mask ventilation without difficulty LMA: LMA inserted LMA Size: 4.0 Number of attempts: 1 Placement Confirmation: positive ETCO2,  CO2 detector and breath sounds checked- equal and bilateral Tube secured with: Tape Dental Injury: Teeth and Oropharynx as per pre-operative assessment

## 2019-03-12 NOTE — Anesthesia Preprocedure Evaluation (Signed)
Anesthesia Evaluation  Patient identified by MRN, date of birth, ID band Patient awake    Reviewed: Allergy & Precautions, NPO status , Patient's Chart, lab work & pertinent test results, reviewed documented beta blocker date and time   Airway Mallampati: II  TM Distance: >3 FB Neck ROM: Full    Dental no notable dental hx.    Pulmonary neg pulmonary ROS,    Pulmonary exam normal breath sounds clear to auscultation       Cardiovascular Normal cardiovascular exam Rhythm:Regular Rate:Normal  HLD  TTE 2014 Normal LVEF, valves ok  Stress Test 2012 normal   Neuro/Psych PSYCHIATRIC DISORDERS Anxiety Depression negative neurological ROS     GI/Hepatic Neg liver ROS, hiatal hernia, GERD  Medicated and Controlled,  Endo/Other  negative endocrine ROS  Renal/GU negative Renal ROS  negative genitourinary   Musculoskeletal  (+) Arthritis , Fibromyalgia -  Abdominal   Peds  Hematology negative hematology ROS (+)   Anesthesia Other Findings   Reproductive/Obstetrics                             Anesthesia Physical Anesthesia Plan  ASA: II  Anesthesia Plan: General   Post-op Pain Management:    Induction: Intravenous  PONV Risk Score and Plan: Ondansetron, Dexamethasone and Midazolam  Airway Management Planned: LMA  Additional Equipment:   Intra-op Plan:   Post-operative Plan: Extubation in OR  Informed Consent: I have reviewed the patients History and Physical, chart, labs and discussed the procedure including the risks, benefits and alternatives for the proposed anesthesia with the patient or authorized representative who has indicated his/her understanding and acceptance.     Dental advisory given  Plan Discussed with: CRNA  Anesthesia Plan Comments:         Anesthesia Quick Evaluation

## 2019-03-12 NOTE — Transfer of Care (Signed)
Immediate Anesthesia Transfer of Care Note  Patient: Amanda [name]  Procedure(s) Performed: WIDE LOCAL EXCISION OF CARCINOMA IS SITU OF VULVA (N/A Vulva)  Patient Location: PACU  Anesthesia Type:General  Level of Consciousness: drowsy and patient cooperative  Airway & Oxygen Therapy: Patient Spontanous Breathing  Post-op Assessment: Report given to RN and Post -op Vital signs reviewed and stable  Post vital signs: Reviewed and stable  Last Vitals:  Vitals Value Taken Time  BP    Temp    Pulse 81 03/12/19 1021  Resp 18 03/12/19 1021  SpO2 97 % 03/12/19 1021  Vitals shown include unvalidated device data.  Last Pain:  Vitals:   03/12/19 0850  TempSrc: Oral  PainSc: 0-No pain      Patients Stated Pain Goal: 5 (46/27/03 5009)  Complications: No apparent anesthesia complications

## 2019-03-12 NOTE — Op Note (Signed)
Amanda [name] 1950-08-30 [id number]   Post Operative Note   Date of surgery:  03/12/2019  Pre Op Dx: Vulvar intraepithelial neoplasia grade 3  Post Op Dx: Vulvar epithelial neoplasia grade 3  Procedure: Wide local excision vulvar epithelial neoplasia grade 3  Surgeon:  Belinda Block Satoshi Kalas  Anesthesia:  General  EBL: 3 cc  Complications:  None  Specimen: Vulvar excisional specimen to pathology  Findings: EUA: External BUS vagina with atrophic changes.  Raised acetowhite plaque lower left labia minora.  Vagina with atrophic changes.  Bimanual without masses.  Physical Exam  Genitourinary:         Procedure: Patient was taken to the operating room, placed in low dorsolithotomy position and underwent general anesthesia without difficulty.  The timeout was performed by the surgical team.  The patient received a Betadine vulvar and vaginal preparation by nursing personnel and the bladder was emptied with an in and out Foley catheterization.  The patient was draped in the usual fashion.  Acetic acid was applied to the vulva and the lesion that was previously identified in the office under colposcopy was clearly visible.  Extending past the visible lesion the entire lesion was excised in an elliptical incision, pinned to cork and sent to pathology.  Electrocautery was used for hemostasis and the skin was reapproximated using 3-0 Vicryl in interrupted cutaneous stitches.  0.25% Marcaine was injected subcutaneously around the incision site and adequate hemostasis was visualized.  The sponge, needle and instrument count were verified correct and the patient was wanded per protocol.  The specimen was identified for pathology.  The patient was subsequently awakened without difficulty and taken to recovery in good condition having tolerated procedure well.     [name], 10:26 AM 03/12/2019

## 2019-03-13 ENCOUNTER — Encounter (HOSPITAL_BASED_OUTPATIENT_CLINIC_OR_DEPARTMENT_OTHER): Payer: Self-pay | Admitting: Gynecology

## 2019-03-13 ENCOUNTER — Other Ambulatory Visit: Payer: Medicare Other

## 2019-03-13 LAB — SURGICAL PATHOLOGY

## 2019-03-18 ENCOUNTER — Other Ambulatory Visit: Payer: Self-pay

## 2019-03-18 ENCOUNTER — Ambulatory Visit
Admission: RE | Admit: 2019-03-18 | Discharge: 2019-03-18 | Disposition: A | Discharge status: Home / Self Care | Payer: Medicare Other | Source: Ambulatory Visit | Attending: Gynecology | Admitting: Gynecology

## 2019-03-18 DIAGNOSIS — N644 Mastodynia: Secondary | ICD-10-CM

## 2019-03-18 MED ORDER — GADOBUTROL 1 MMOL/ML IV SOLN
6.0000 mL | Freq: Once | INTRAVENOUS | Status: AC | PRN
  Administered 2019-03-18: 6 mL via INTRAVENOUS

## 2019-03-19 ENCOUNTER — Encounter: Payer: Self-pay | Admitting: Gynecology

## 2019-03-20 ENCOUNTER — Emergency Department (HOSPITAL_BASED_OUTPATIENT_CLINIC_OR_DEPARTMENT_OTHER): Payer: Medicare Other

## 2019-03-20 ENCOUNTER — Other Ambulatory Visit: Payer: Self-pay

## 2019-03-20 ENCOUNTER — Emergency Department (HOSPITAL_BASED_OUTPATIENT_CLINIC_OR_DEPARTMENT_OTHER)
Admission: EM | Admit: 2019-03-20 | Discharge: 2019-03-20 | Disposition: A | Discharge status: Home / Self Care | Payer: Medicare Other | Attending: Emergency Medicine | Admitting: Emergency Medicine

## 2019-03-20 ENCOUNTER — Encounter (HOSPITAL_BASED_OUTPATIENT_CLINIC_OR_DEPARTMENT_OTHER): Payer: Self-pay | Admitting: Emergency Medicine

## 2019-03-20 ENCOUNTER — Ambulatory Visit: Payer: Self-pay | Admitting: *Deleted

## 2019-03-20 ENCOUNTER — Telehealth: Payer: Self-pay | Admitting: Physician Assistant

## 2019-03-20 DIAGNOSIS — M79661 Pain in right lower leg: Secondary | ICD-10-CM

## 2019-03-20 DIAGNOSIS — Z881 Allergy status to other antibiotic agents status: Secondary | ICD-10-CM | POA: Insufficient documentation

## 2019-03-20 DIAGNOSIS — M79604 Pain in right leg: Secondary | ICD-10-CM | POA: Diagnosis not present

## 2019-03-20 DIAGNOSIS — M797 Fibromyalgia: Secondary | ICD-10-CM | POA: Insufficient documentation

## 2019-03-20 DIAGNOSIS — Z888 Allergy status to other drugs, medicaments and biological substances status: Secondary | ICD-10-CM | POA: Insufficient documentation

## 2019-03-20 DIAGNOSIS — Z7982 Long term (current) use of aspirin: Secondary | ICD-10-CM | POA: Insufficient documentation

## 2019-03-20 DIAGNOSIS — Z79899 Other long term (current) drug therapy: Secondary | ICD-10-CM | POA: Insufficient documentation

## 2019-03-20 DIAGNOSIS — E785 Hyperlipidemia, unspecified: Secondary | ICD-10-CM | POA: Insufficient documentation

## 2019-03-20 DIAGNOSIS — R600 Localized edema: Secondary | ICD-10-CM | POA: Diagnosis not present

## 2019-03-20 NOTE — Telephone Encounter (Signed)
Pt called in c/o right calf pain that is waking her up during the night.   "I wonder if I have a blood clot".    No history of blood clots. Right leg/calf is not warmer or swollen than left.    Feet color is the same.   No visible signs on the outside just the severe pain inside the calf. See triage notes.  She has decided to go to the [facility] ED.   Husband is going to take her.  Protocol is to be seen within 4 hours.    They are going now to the ED.  (She is a former Dr. [name] pt who has an appt to transfer care to Dr. Yong Channel on 03/26/2019 per pt).  Reason for Disposition . [1] Thigh or calf pain AND [2] only 1 side AND [3] present > 1 hour  Answer Assessment - Initial Assessment Questions 1. ONSET: "When did the pain start?"      Right calf hurts.    I saw Dr. Morene Rankins 3-4 wks ago for this. She measured my calves to compare.   No difference.  It hurt on the inside of my calf. 2. LOCATION: "Where is the pain located?"      Right calf.   The back of my calf. 3. PAIN: "How bad is the pain?"    (Scale 1-10; or mild, moderate, severe)   -  MILD (1-3): doesn't interfere with normal activities    -  MODERATE (4-7): interferes with normal activities (e.g., work or school) or awakens from sleep, limping    -  SEVERE (8-10): excruciating pain, unable to do any normal activities, unable to walk     My right foot started tingling and hurting.   It lasted 20 sec. Right calf pain is an 8 on scale. 4. WORK OR EXERCISE: "Has there been any recent work or exercise that involved this part of the body?"      No injuries 5. CAUSE: "What do you think is causing the leg pain?"     Maybe a blood clot.    6. OTHER SYMPTOMS: "Do you have any other symptoms?" (e.g., chest pain, back pain, breathing difficulty, swelling, rash, fever, numbness, weakness)     My right calf is not warmer than left.   Right calf is not look swollen.  Both feet look the same.   No history of blood clots.   Take  81 mg aspirin daily. 7. PREGNANCY: "Is there any chance you are pregnant?" "When was your last menstrual period?"     N/A due to age  Protocols used: LEG PAIN-A-AH

## 2019-03-20 NOTE — Telephone Encounter (Signed)
Left detailed message on personal voicemail. Aldona Bar is out of the office so she can not order ultrasound for you, you would need to be seen. Also I saw Triage spoke to you and said you would go to ED I would suggest you go to the ED. Any questions please call office.

## 2019-03-20 NOTE — ED Provider Notes (Signed)
[facility] EMERGENCY DEPARTMENT Provider Note   CSN: [id number] Arrival date & time: 03/20/19  1227     History   Chief Complaint Chief Complaint  Patient presents with  . Leg Pain    HPI Amanda [name] is a 68 y.o. female who presents with right calf pain.  Patient states that on 10/22 she had acute onset of pain over the medial aspect of her right calf which felt like a "hot poker".  It was well localized in this area.  She denies any trauma, redness, or swelling to the calf.  She saw her primary care provider who ordered a DVT study which was negative.  She states that this pain has resolved however over the past 3 days she has been having severe pain in her right calf has been waking her up from sleep.  Seems to be worse in the morning and better throughout the day as she moves around. Today the pain was in the back of the calf and she had pain in the sole of her foot.  She denies fever, back pain, thigh pain, leg weakness.  She called her primary care doctor's office today and told them she was worried about a blood clot. Since the provider was not in the office they advised her to come to the ED.  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 She denies any injury.     HPI  Past Medical History:  Diagnosis Date  . Anxiety   . Celiac disease   . Chronic constipation   . GERD (gastroesophageal reflux disease)    occasionally (per pt takes apple cider vinager)  . Heart palpitations    cardiologist-- dr Ellyn Hack--- event monitor 08-24-2011 epic ;  nuclear stress test 03-16-2011 (epic) normal w/ no ischemia, ef 65%;  echo 01-18-2013  ef 60-65%, G1DD  . Hiatal hernia   . History of subdural hematoma    12/ 23/ 2010  s/p  craniotomy w/ hematoma evacuation (pt had a fall w/ concussion)  per pt no residual  . History of thyroid cancer followed by dr Constance Holster---   s/p  left thyroidectomy --- per pt no radiation and no recurrence  . Hyperlipidemia    . Insomnia   . OA (osteoarthritis)   . Osteopenia, T score -2.1 FRAX 9.4%/1.3% stable from prior DEXA 06/2015  . Restless leg syndrome   . Right thyroid nodule    followed by dr Constance Holster--- last ultrasound in epic 03-30-2018 stable , to bx  . STD (sexually transmitted disease), HSV   . 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 Wears glasses     Patient Active Problem List   Diagnosis Date Noted  . 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 Osteoarthritis of left knee 07/13/2018  . Osteoarthritis of right knee 07/13/2018  . History of myasthenia gravis 04/01/2018  . Risk of myocardial infarction or stroke 7.5% or greater in next 10 years 03/30/2018  . Mastalgia, left, followed by GYN 03/29/2018  . Fibromyalgia 03/29/2018  . Fuchs' corneal dystrophy, 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  . Insulin resistance 03/29/2018  . History of hysterectomy 03/29/2018  . Grade I diastolic dysfunction 15/17/6160  . Thyroid nodule 03/27/2018  . Presbycusis of both ears 03/10/2017  . Dysthymia 09/26/2016  . Anxiety, prn Klonopin 08/03/2016  . Laryngopharyngeal reflux (LPR) 07/26/2016  . Thyroid cancer ([place])   . GERD (gastroesophageal reflux disease)   . Restless leg syndrome, on  Requip   . Allergic rhinitis 06/25/2009  . Diaphragmatic hernia 02/16/2009  . IBS (irritable bowel syndrome) 02/16/2009  . Hyperlipidemia with target LDL less than 130, on Lipitor 09/26/2008  . Celiac disease 11/27/2007  . Sleep disorder 07/05/2007  . Osteoarthritis 07/05/2007  . Osteopenia 10/03/2006  . Left carotid bruit 10/03/2006  . History of thyroid cancer 10/03/2006    Past Surgical History:  Procedure Laterality Date  . ABDOMINAL HYSTERECTOMY  1986   ovaries remain  . BREAST REDUCTION SURGERY Bilateral 1999  . CATARACT EXTRACTION W/ INTRAOCULAR LENS  IMPLANT, BILATERAL  2019  . COLONOSCOPY  last one 11-22-2018  . ESOPHAGOGASTRODUODENOSCOPY    . KNEE ARTHROSCOPY Bilateral 2002  . SUBDURAL  HEMATOMA EVACUATION VIA CRANIOTOMY  04-30-2009  @[facility]    left frontotemporapartietal   . THYROID LOBECTOMY Left 05-04-2001  @[facility]   dr Constance Holster  . THYROIDECTOMY, PARTIAL    . VULVECTOMY N/A 03/12/2019   Procedure: WIDE LOCAL EXCISION OF CARCINOMA IS SITU OF VULVA;  Surgeon: Anastasio Auerbach, MD;  Location: [facility];  Service: Gynecology;  Laterality: N/A;  request to follow in [place] block time requests one hour colposcope available in OR with acetic acid.     OB History    Gravida  2   Para  2   Term  2   Preterm      AB      Living  2     SAB      TAB      Ectopic      Multiple      Live Births               Home Medications    Prior to Admission medications   Medication Sig Start Date End Date Taking? Authorizing Provider  APPLE CIDER VINEGAR PO Take 1 capsule by mouth 2 (two) times daily. Reported on 06/09/2015    [provider]  aspirin EC 81 MG tablet Take 81 mg by mouth daily.    [provider]  atorvastatin (LIPITOR) 20 MG tablet TAKE 1 TABLET BY MOUTH EVERY OTHER DAY Patient taking differently: Take 10 mg by mouth at bedtime.  02/28/19   Briscoe Deutscher, DO  bisacodyl (DULCOLAX) 5 MG EC tablet Take 5 mg by mouth daily as needed for moderate constipation.    [provider]  Black Currant Seed Oil 500 MG CAPS Take by mouth daily.    [provider]  CALCIUM-VITAMIN D PO Take 1 tablet by mouth daily.     [provider]  cholecalciferol (VITAMIN D3) 25 MCG (1000 UT) tablet Take 1,000 Units by mouth daily. Take 2 in the am    [provider]  clonazePAM (KLONOPIN) 1 MG tablet Take 1 tablet (1 mg total) by mouth 3 (three) times daily as needed. for anxiety 02/05/19   Briscoe Deutscher, DO  CO ENZYME Q-10 PO Take by mouth daily.     [provider]  Cyanocobalamin (VITAMIN B-12 ER PO) Take 5,000 mcg by mouth daily.    [provider]  diclofenac sodium (VOLTAREN) 1 % GEL  APPLY TOPICALLY TO AFFECTED AREA 4 TIMES A DAY 05/28/18   Teresa Coombs D, DO  ELDERBERRY PO Take by mouth. Take one daily    [provider]  escitalopram (LEXAPRO) 10 MG tablet Take 10 mg by mouth at bedtime.  07/21/18   [provider]  magnesium citrate SOLN Take 1 Bottle by mouth as needed.  [provider]  Melatonin 3 MG CAPS Take by mouth at bedtime as needed.     [provider]  metoprolol succinate (TOPROL-XL) 25 MG 24 hr tablet Take 1 tablet (25 mg total) by mouth daily. Patient taking differently: Take 25 mg by mouth at bedtime.  02/27/19   Lendon Colonel, NP  Multiple Vitamin (MULTIVITAMIN) tablet Take 1 tablet by mouth daily.      [provider]  NON FORMULARY [place] and lomb ointment-Muro-//128, 5%ointment- Each eye nightly    [provider]  OIL OF [place] PO Take by mouth. Take one daily    [provider]  polyethylene glycol (MIRALAX / GLYCOLAX) packet Take 17 g by mouth daily as needed.     [provider]  Propylene Glycol (SYSTANE BALANCE) 0.6 % SOLN Apply 1 drop to eye 2 (two) times daily. For dry eyes, takes along with restasis    [provider]  rOPINIRole (REQUIP) 0.25 MG tablet Take 1 tablet (0.25 mg total) by mouth at bedtime. 02/05/19   Briscoe Deutscher, DO  valACYclovir (VALTREX) 500 MG tablet One tab bid as needed for outbreak 02/05/19   Briscoe Deutscher, DO    Family History Family History  Problem Relation Age of Onset  . Diabetes Maternal Aunt   . Breast cancer Maternal Aunt 60  . Liver cancer Father   . Heart disease Maternal Grandfather   . Hyperlipidemia Maternal Grandfather   . Hypertension Paternal Grandfather   . Colon cancer Neg Hx     Social History Social History   Tobacco Use  . Smoking status: Never Smoker  . Smokeless tobacco: Never Used  Substance Use Topics  . Alcohol use: Yes    Alcohol/week: 7.0 standard drinks    Types: 7 Cans of beer per week     Comment: 1 beer nightly  . Drug use: Never     Allergies   Ambien [zolpidem tartrate], Cortisone, Eszopiclone and related, Levofloxacin, Dicloxacillin, Dilantin [phenytoin sodium extended], Erythromycin, Avelox [moxifloxacin hcl in nacl], Other, Nitrofuran derivatives, Phenytoin sodium extended, and Trazodone and nefazodone   Review of Systems Review of Systems  Constitutional: Negative for fever.  Musculoskeletal: Positive for arthralgias and myalgias.  Neurological: Negative for weakness and numbness.  All other systems reviewed and are negative.    Physical Exam Updated Vital Signs BP (!) 155/91 (BP Location: Right Arm)   Pulse 80   Temp 98.6 F (37 C) (Oral)   Resp 16   Ht 5' 5"  (1.651 m)   Wt 70.3 kg   LMP  (LMP Unknown)   SpO2 98%   BMI 25.79 kg/m   Physical Exam Vitals signs and nursing note reviewed.  Constitutional:      General: She is not in acute distress.    Appearance: Normal appearance. She is well-developed. She is not ill-appearing.  HENT:     Head: Normocephalic and atraumatic.  Eyes:     General: No scleral icterus.       Right eye: No discharge.        Left eye: No discharge.     Conjunctiva/sclera: Conjunctivae normal.     Pupils: Pupils are equal, round, and reactive to light.  Neck:     Musculoskeletal: Normal range of motion.  Cardiovascular:     Rate and Rhythm: Normal rate.  Pulmonary:     Effort: Pulmonary effort is normal. No respiratory distress.  Abdominal:     General: There is no distension.  Musculoskeletal:  Comments: No back tenderness  RLE: No obvious swelling, deformity, or warmth. Tenderness to palpation of the back of the calf. FROM of knee and ankle. 5/5 strength. Cap refill <2. N/V intact.   Skin:    General: Skin is warm and dry.  Neurological:     Mental Status: She is alert and oriented to person, place, and time.  Psychiatric:        Behavior: Behavior normal.      ED Treatments / Results  Labs  (all labs ordered are listed, but only abnormal results are displayed) Labs Reviewed - No data to display  EKG None  Radiology Dg Tibia/fibula Right  Result Date: 03/20/2019 CLINICAL DATA:  Lower leg pain for the past month. EXAM: RIGHT TIBIA AND FIBULA - 2 VIEW COMPARISON:  Right knee x-rays dated August 01, 2014. FINDINGS: There is no evidence of fracture or other focal bone lesions. Tricompartmental knee osteoarthritis, similar to 2016. Mild talonavicular osteoarthritis. Soft tissues are unremarkable. IMPRESSION: No acute osseous abnormality. Electronically Signed   By: Titus Dubin M.D.   On: 03/20/2019 13:54   US Venous Img Lower Right (dvt Study)  Result Date: 03/20/2019 CLINICAL DATA:  Right lower extremity pain and edema. History of thyroid cancer. Evaluate for DVT. EXAM: RIGHT LOWER EXTREMITY VENOUS DOPPLER ULTRASOUND TECHNIQUE: Gray-scale sonography with graded compression, as well as color Doppler and duplex ultrasound were performed to evaluate the lower extremity deep venous systems from the level of the common femoral vein and including the common femoral, femoral, profunda femoral, popliteal and calf veins including the posterior tibial, peroneal and gastrocnemius veins when visible. The superficial great saphenous vein was also interrogated. Spectral Doppler was utilized to evaluate flow at rest and with distal augmentation maneuvers in the common femoral, femoral and popliteal veins. COMPARISON:  None. FINDINGS: Contralateral Common Femoral Vein: Respiratory phasicity is normal and symmetric with the symptomatic side. No evidence of thrombus. Normal compressibility. Common Femoral Vein: No evidence of thrombus. Normal compressibility, respiratory phasicity and response to augmentation. Saphenofemoral Junction: No evidence of thrombus. Normal compressibility and flow on color Doppler imaging. Profunda Femoral Vein: No evidence of thrombus. Normal compressibility and flow on color  Doppler imaging. Femoral Vein: No evidence of thrombus. Normal compressibility, respiratory phasicity and response to augmentation. Popliteal Vein: No evidence of thrombus. Normal compressibility, respiratory phasicity and response to augmentation. Calf Veins: No evidence of thrombus. Normal compressibility and flow on color Doppler imaging. Superficial Great Saphenous Vein: No evidence of thrombus. Normal compressibility. Venous Reflux:  None. Other Findings:  None. IMPRESSION: No evidence of DVT within the right lower extremity. Electronically Signed   By: Sandi Mariscal M.D.   On: 03/20/2019 14:17    Procedures Procedures (including critical care time)  Medications Ordered in ED Medications - No data to display   Initial Impression / Assessment and Plan / ED Course  I have reviewed the triage vital signs and the nursing notes.  Pertinent labs & imaging results that were available during my care of the patient were reviewed by me and considered in my medical decision making (see chart for details).  Clinical Course as of Mar 19 1508  Wed Nov 11, 418  2918 68 year old female recent pelvic surgery here with pain in her right calf and the bottom of her right foot that is been going on for 3 to 4 days.  No swelling.  No injury.  Calf is soft somewhat tender no obvious cords.  X-ray and duplex negative.  Will  refer back to PCP for continued management of this.   [MB]    Clinical Course User Index [MB] Hayden Rasmussen, MD    68 year old female presents with right calf pain which intermittently radiates to the foot for the past several days.  She is mildly hypertensive but otherwise vital signs are normal.  Suspect nerve irritation versus muscle pain as pain is reproducible with palpation.  DVT study was ordered since patient is having pain in a new area and recently had surgery although I have a low suspicion for acute DVT.    X-ray of the leg was ordered which was negative.  DVT study was  negative.  Shared visit with Dr. Melina Copa.  Advised conservative management  Final Clinical Impressions(s) / ED Diagnoses   Final diagnoses:  Right calf pain    ED Discharge Orders    None       Recardo Evangelist, PA-C 03/20/19 1518    Hayden Rasmussen, MD 03/20/19 [phone]

## 2019-03-20 NOTE — ED Triage Notes (Signed)
Pt here with right calf pain. States it feels like "hot poker". States this has happened before about a month ago. Nothing was found then, but MD told her to come today.

## 2019-03-20 NOTE — Telephone Encounter (Signed)
See note  Copied from [place] [id number]. Topic: General - Other >> Mar 20, 2019 11:26 AM Celene Kras A wrote: Reason for CRM: Pt called stating saw Samantha on 02/20/2019. Pt states she is having pain in her right calf. Pt is requesting to have an ultrasound ordered so she can find out why she is having this pain. Please advise.

## 2019-03-20 NOTE — Discharge Instructions (Signed)
Try Ibuprofen or Tylenol for pain Do stretches and strengthening exercises for the lower leg Please follow up with your doctor

## 2019-03-26 ENCOUNTER — Ambulatory Visit (INDEPENDENT_AMBULATORY_CARE_PROVIDER_SITE_OTHER): Payer: Medicare Other | Admitting: Family Medicine

## 2019-03-26 ENCOUNTER — Encounter: Payer: Self-pay | Admitting: Family Medicine

## 2019-03-26 VITALS — Ht 65.0 in | Wt 157.0 lb

## 2019-03-26 DIAGNOSIS — Z8669 Personal history of other diseases of the nervous system and sense organs: Secondary | ICD-10-CM

## 2019-03-26 DIAGNOSIS — B009 Herpesviral infection, unspecified: Secondary | ICD-10-CM

## 2019-03-26 DIAGNOSIS — K219 Gastro-esophageal reflux disease without esophagitis: Secondary | ICD-10-CM

## 2019-03-26 DIAGNOSIS — E041 Nontoxic single thyroid nodule: Secondary | ICD-10-CM

## 2019-03-26 DIAGNOSIS — G479 Sleep disorder, unspecified: Secondary | ICD-10-CM | POA: Diagnosis not present

## 2019-03-26 DIAGNOSIS — M79661 Pain in right lower leg: Secondary | ICD-10-CM

## 2019-03-26 DIAGNOSIS — K581 Irritable bowel syndrome with constipation: Secondary | ICD-10-CM

## 2019-03-26 DIAGNOSIS — R002 Palpitations: Secondary | ICD-10-CM

## 2019-03-26 DIAGNOSIS — E785 Hyperlipidemia, unspecified: Secondary | ICD-10-CM

## 2019-03-26 DIAGNOSIS — M797 Fibromyalgia: Secondary | ICD-10-CM

## 2019-03-26 DIAGNOSIS — E8881 Metabolic syndrome: Secondary | ICD-10-CM | POA: Diagnosis not present

## 2019-03-26 DIAGNOSIS — F419 Anxiety disorder, unspecified: Secondary | ICD-10-CM

## 2019-03-26 DIAGNOSIS — G2581 Restless legs syndrome: Secondary | ICD-10-CM

## 2019-03-26 DIAGNOSIS — E88819 Insulin resistance, unspecified: Secondary | ICD-10-CM

## 2019-03-26 DIAGNOSIS — F341 Dysthymic disorder: Secondary | ICD-10-CM | POA: Diagnosis not present

## 2019-03-26 DIAGNOSIS — Z8679 Personal history of other diseases of the circulatory system: Secondary | ICD-10-CM | POA: Insufficient documentation

## 2019-03-26 DIAGNOSIS — F5101 Primary insomnia: Secondary | ICD-10-CM

## 2019-03-26 MED ORDER — CLONAZEPAM 1 MG PO TABS: 1.0000 mg | ORAL_TABLET | Freq: Every evening | ORAL | 0 refills | Status: DC | PRN

## 2019-03-26 MED ORDER — ATORVASTATIN CALCIUM 10 MG PO TABS: 10.0000 mg | ORAL_TABLET | Freq: Every day | ORAL | 3 refills | Status: DC

## 2019-03-26 MED ORDER — ROPINIROLE HCL 0.25 MG PO TABS: 0.2500 mg | ORAL_TABLET | Freq: Every day | ORAL | 3 refills | Status: DC

## 2019-03-26 MED ORDER — VALACYCLOVIR HCL 500 MG PO TABS: ORAL_TABLET | ORAL | 1 refills | Status: DC

## 2019-03-26 NOTE — Assessment & Plan Note (Signed)
S: Reports very well controlled on Requip Lab Results  Component Value Date   FERRITIN 26.2 10/04/2017  A/P: well controlled despite low ferritin-could take ferrous sulfate once a week over-the-counter if she would like.  We definitely would do this if she started to have symptoms despite the Requip.

## 2019-03-26 NOTE — Assessment & Plan Note (Signed)
2010- fell with large jar but did not hit head. Had surgery for this. Later restarted on aspirin- if any future bleeding issues would stop asa. Does have slight carotid stenosis 1-39% so leaving on for now.

## 2019-03-26 NOTE — Assessment & Plan Note (Signed)
S: Patient has never had issues with driving.Clonazepam 0.5 tablet before bed and then repeats if wakes up in AM.  A/P: stable- continue current meds. Advised do not drive for minimum of 8 hours after taking clonazepam

## 2019-03-26 NOTE — Assessment & Plan Note (Signed)
Patient does not palpate at home- we will examine next visit.

## 2019-03-26 NOTE — Assessment & Plan Note (Signed)
Follows with Dr. Ellyn Hack.  On metoprolol 25 mg extended release

## 2019-03-26 NOTE — Assessment & Plan Note (Signed)
S:Atorvastatin 34m- takes half tablet. On full tablet- some mental fog.  A/P: Reasonable control on last visit-update lipid panel next year

## 2019-03-26 NOTE — Assessment & Plan Note (Signed)
S: Lexapro 66m. Sleep issues- clonazepam before bed. Self diagnosed as Highly Sensitive Person (see book by Dr. EChristeen Douglas. [facility] 03/26/2019  Decreased Interest 0  Down, Depressed, Hopeless 0  PHQ - 2 Score 0  Altered sleeping 0  Tired, decreased energy 0  Change in appetite 0  Feeling bad or failure about yourself  0  Trouble concentrating 0  Moving slowly or fidgety/restless 0  Suicidal thoughts 0  PHQ-9 Score 0  Difficult doing work/chores Not difficult at all  Some recent data might be hidden  A/P: well controlled- continue current medicines

## 2019-03-26 NOTE — Patient Instructions (Signed)
There are no preventive care reminders to display for this patient. Depression screen [facility] 2/9 11/12/2018 03/30/2018 03/30/2018  Decreased Interest 0 0 0  Down, Depressed, Hopeless 0 0 0  PHQ - 2 Score 0 0 0  Altered sleeping 0 3 0  Tired, decreased energy 0 3 0  Change in appetite 0 0 0  Feeling bad or failure about yourself  0 0 0  Trouble concentrating 0 0 0  Moving slowly or fidgety/restless 0 0 0  Suicidal thoughts 0 0 0  PHQ-9 Score 0 6 0  Difficult doing work/chores Not difficult at all Not difficult at all Not difficult at all  Some recent data might be hidden

## 2019-03-26 NOTE — Progress Notes (Signed)
Phone [phone] Virtual visit via Video note   Subjective:  Chief complaint: Chief Complaint  Patient presents with  . TOC- hyperlipidemia, insomnia, etc- see problem oriented visit   This visit type was conducted due to national recommendations for restrictions regarding the COVID-19 Pandemic (e.g. social distancing).  This format is felt to be most appropriate for this patient at this time balancing risks to patient and risks to population by having him in for in person visit.  No physical exam was performed (except for noted visual exam or audio findings with Telehealth visits).    Our team/I connected with [name] at  1:00 PM EST by a video enabled telemedicine application (doxy.me or caregility through epic) and verified that I am speaking with the correct person using two identifiers.  Location patient: [place] Location provider: [facility], office Persons participating in the virtual visit:  patient  Our team/I discussed the limitations of evaluation and management by telemedicine and the availability of in person appointments. In light of current covid-19 pandemic, patient also understands that we are trying to protect them by minimizing in office contact if at all possible.  The patient expressed consent for telemedicine visit and agreed to proceed. Patient understands insurance will be billed.   ROS- No chest pain or shortness of breath. No headache or blurry vision.    Past Medical History-  Patient Active Problem List   Diagnosis Date Noted  . History of Guillain-Barre syndrome 04/01/2018    Priority: High  . Thyroid nodule 03/27/2018    Priority: High  . Celiac disease 11/27/2007    Priority: High  . Sleep disorder 07/05/2007    Priority: High  . History of thyroid cancer 10/03/2006    Priority: High  . Herpes simplex type 1 infection 11/01/2018    Priority: Medium  . Hyperlipidemia, unspecified 03/30/2018    Priority: Medium  . Fibromyalgia  03/29/2018    Priority: Medium  . Fuchs' corneal dystrophy, 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    Priority: Medium  . Insulin resistance 03/29/2018    Priority: Medium  . Grade I diastolic dysfunction 63/84/5364    Priority: Medium  . Dysthymia 09/26/2016    Priority: Medium  . Anxiety, prn Klonopin 08/03/2016    Priority: Medium  . Palpitations 01/17/2013    Priority: Medium  . GERD (gastroesophageal reflux disease)     Priority: Medium  . Restless leg syndrome, on Requip     Priority: Medium  . IBS (irritable bowel syndrome) 02/16/2009    Priority: Medium  . Osteoarthritis 07/05/2007    Priority: Medium  . Osteopenia 10/03/2006    Priority: Medium  . Mastalgia, left, followed by GYN 03/29/2018    Priority: Low  . History of hysterectomy 03/29/2018    Priority: Low  . Presbycusis of both ears 03/10/2017    Priority: Low  . Allergic rhinitis 06/25/2009    Priority: Low  . Diaphragmatic hernia 02/16/2009    Priority: Low  . Left carotid bruit 10/03/2006    Priority: Low  . History of subdural hematoma 03/26/2019   Medications- reviewed and updated Current Outpatient Medications  Medication Sig Dispense Refill  . APPLE CIDER VINEGAR PO Take 1 capsule by mouth 2 (two) times daily. Reported on 06/09/2015    . aspirin EC 81 MG tablet Take 81 mg by mouth daily.    [name] atorvastatin (LIPITOR) 20 MG tablet TAKE 1 TABLET BY MOUTH EVERY OTHER DAY (Patient taking differently: Take 10 mg by mouth at  bedtime. ) 45 tablet 0  . bisacodyl (DULCOLAX) 5 MG EC tablet Take 5 mg by mouth daily as needed for moderate constipation.    . Black Currant Seed Oil 500 MG CAPS Take by mouth daily.    [name] CALCIUM-VITAMIN D PO Take 1 tablet by mouth daily.     . cholecalciferol (VITAMIN D3) 25 MCG (1000 UT) tablet Take 1,000 Units by mouth daily. Take 2 in the am    . clonazePAM (KLONOPIN) 1 MG tablet Take 1 tablet (1 mg total) by mouth 3 (three) times daily as needed. for anxiety 90 tablet 1  . CO ENZYME  Q-10 PO Take by mouth daily.     . Cyanocobalamin (VITAMIN B-12 ER PO) Take 5,000 mcg by mouth daily.    . diclofenac sodium (VOLTAREN) 1 % GEL APPLY TOPICALLY TO AFFECTED AREA 4 TIMES A DAY 100 g 1  . ELDERBERRY PO Take by mouth. Take one daily    . escitalopram (LEXAPRO) 10 MG tablet Take 10 mg by mouth at bedtime.     . magnesium citrate SOLN Take 1 Bottle by mouth as needed.     . Melatonin 3 MG CAPS Take by mouth at bedtime as needed.     . metoprolol succinate (TOPROL-XL) 25 MG 24 hr tablet Take 1 tablet (25 mg total) by mouth daily. (Patient taking differently: Take 25 mg by mouth at bedtime. ) 90 tablet 3  . Multiple Vitamin (MULTIVITAMIN) tablet Take 1 tablet by mouth daily.      . NON FORMULARY Bausch and lomb ointment-Muro-//128, 5%ointment- Each eye nightly    . OIL OF OREGANO PO Take by mouth. Take one daily    . polyethylene glycol (MIRALAX / GLYCOLAX) packet Take 17 g by mouth daily as needed.     [name] Propylene Glycol (SYSTANE BALANCE) 0.6 % SOLN Apply 1 drop to eye 2 (two) times daily. For dry eyes, takes along with restasis    . rOPINIRole (REQUIP) 0.25 MG tablet Take 1 tablet (0.25 mg total) by mouth at bedtime. 90 tablet 2  . valACYclovir (VALTREX) 500 MG tablet One tab bid as needed for outbreak 180 tablet 1   No current facility-administered medications for this visit.      Objective:  Ht 5' 5"  (1.651 m)   Wt 157 lb (71.2 kg)   LMP  (LMP Unknown)   BMI 26.13 kg/m  self reported vitals Gen: NAD, resting comfortably Lungs: nonlabored, normal respiratory rate  Skin: appears dry, no obvious rash    Assessment and Plan   #recent gyn surgery vulvectomy for dysplasia- goes back shortly for colposcopy for recheck  # Right calf pain S:03/20/2019  Right lower extremity venous duplex was negative for DVT- had been scanned a month prior. . She started with arnacare- OTC gel to help with muscular options . Has also done calf stretches. Can wake her up from sleep if doesn't  take arnacare. Is on statin but we dont strongly suspect statin is primary cause especially with relief with arnacare.  A/P: sounds like muscular strain/spasm- ok to continue conservative care   Thyroid nodule Patient does not palpate at home- we will examine next visit.   Sleep disorder S: Patient has never had issues with driving.Clonazepam 0.5 tablet before bed and then repeats if wakes up in AM.  A/P: stable- continue current meds. Advised do not drive for minimum of 8 hours after taking clonazepam   Hyperlipidemia, unspecified S:Atorvastatin 45m- takes half tablet. On full  tablet- some mental fog.  A/P: Reasonable control on last visit-update lipid panel next year   Restless leg syndrome, on Requip S: Reports very well controlled on Requip Lab Results  Component Value Date   FERRITIN 26.2 10/04/2017  A/P: well controlled despite low ferritin-could take ferrous sulfate once a week over-the-counter if she would like.  We definitely would do this if she started to have symptoms despite the Requip.  Palpitations Follows with Dr. Ellyn Hack.  On metoprolol 25 mg extended release  Dysthymia S: Lexapro 74m. Sleep issues- clonazepam before bed. Self diagnosed as Highly Sensitive Person (see book by Dr. EChristeen Douglas. [facility]  Decreased Interest 0  Down, Depressed, Hopeless 0  PHQ - 2 Score 0  Altered sleeping 0  Tired, decreased energy 0  Change in appetite 0  Feeling bad or failure about yourself  0  Trouble concentrating 0  Moving slowly or fidgety/restless 0  Suicidal thoughts 0  PHQ-9 Score 0  Difficult doing work/chores Not difficult at all  Some recent data might be hidden  A/P: well controlled- continue current medicines    History of subdural hematoma 2010- fell with large jar but did not hit head. Had surgery for this. Later restarted on aspirin- if any future bleeding issues would stop asa. Does have slight carotid stenosis 1-39% so  leaving on for now.   #a1c next labs   Recommended follow up: 6 month follow up and update bloodwork. May refill any meds until that visit.  Future Appointments  Date Time Provider [place] 05/15/2019  3:00 PM LPrincess Bruins MD [facility]    Lab/Order associations:   ICD-10-CM   1. Right calf pain  M79.661   2. Hyperlipidemia, unspecified hyperlipidemia type  E78.5   3. Restless leg syndrome, on Requip  G25.81   4. Dysthymia  F34.1   5. Sleep disorder  G47.9   6. Thyroid nodule  E04.1   7. Insulin resistance  E88.81   8. Irritable bowel syndrome with constipation  K58.1   9. History of Guillain-Barre syndrome  Z86.69   10. Herpes simplex type 1 infection  B00.9   11. Gastroesophageal reflux disease without esophagitis  K21.9   12. Palpitations  R00.2   13. Fibromyalgia  M79.7   14. Anxiety, prn Klonopin  F41.9   15. History of subdural hematoma  Z86.79     Time Stamp The duration of face-to-face time during this visit was greater than 42 minutes. Greater than 50% of this time was spent in counseling, explanation of diagnosis, planning of further management, and/or coordination of care including discussion of long term health conditions, their meaning, significance and plan moving forwards also clarifying prior medical records and discussing no flu shot with guillain barre history.    Return precautions advised.  SGarret Reddish MD

## 2019-05-14 ENCOUNTER — Other Ambulatory Visit: Payer: Self-pay

## 2019-05-15 ENCOUNTER — Encounter: Payer: Self-pay | Admitting: Obstetrics & Gynecology

## 2019-05-15 ENCOUNTER — Ambulatory Visit (INDEPENDENT_AMBULATORY_CARE_PROVIDER_SITE_OTHER): Payer: Medicare Other | Admitting: Obstetrics & Gynecology

## 2019-05-15 ENCOUNTER — Other Ambulatory Visit: Payer: Self-pay | Admitting: Obstetrics & Gynecology

## 2019-05-15 VITALS — BP 118/80

## 2019-05-15 DIAGNOSIS — D071 Carcinoma in situ of vulva: Secondary | ICD-10-CM

## 2019-05-15 DIAGNOSIS — N9089 Other specified noninflammatory disorders of vulva and perineum: Secondary | ICD-10-CM

## 2019-05-15 NOTE — Progress Notes (Signed)
    Amanda [name] 1950/12/21 [id number]        69 y.o.  G2P2002   RP: Vulvar Colposcopy VIN 3 Wide Excision margin positive 03/12/2019  HPI: H/O VIN 3 post Wide Excision on 03/12/2019.  Patho confirmed CIN 3/CIS with one lateral margin positive.     OB History  Gravida Para Term Preterm AB Living  2 2 2     2   SAB TAB Ectopic Multiple Live Births               # Outcome Date GA Lbr Len/2nd Weight Sex Delivery Anes PTL Lv  2 Term           1 Term             Past medical history,surgical history, problem list, medications, allergies, family history and social history were all reviewed and documented in the EPIC chart.   Directed ROS with pertinent positives and negatives documented in the history of present illness/assessment and plan.  Exam:  Vitals:   05/15/19 1505  BP: 118/80   General appearance:  Normal  Colposcopy Procedure Note Amanda [name] 05/15/2019  Indications:  VIN 3 at Lt inferior vulva, post Wide Excision with lateral margin positive  Procedure Details  The risks and benefits of the procedure and Verbal informed consent obtained.  Speculum placed in vagina and excellent visualization of vulva achieved, cervix swabbed x 3 with acetic acid solution.  Findings:  Cervix colposcopy: Not indicated, not performed.  Pap negative 11/2018.  Vaginal colposcopy:  Not indicated, not performed  Vulvar colposcopy: Physical Exam Genitourinary:      Perirectal colposcopy:  Negative   Specimens:  Vulvar punch Bx at left lower labia majora.  Complications:  None.  Good hemostasis with 2 separate stitches of Vicryl 4-0. [name] Plan:  Management per Vulvar Bx results.  Patho 03/12/2019: FINAL MICROSCOPIC DIAGNOSIS:   A. 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 G2P2002   1. 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  Wide excision performed at that level by Dr. Phineas Real on March 12, 2019.  Pathology confirmed CIN-3/CIS with one lateral margin positive.  Vulvar colposcopy done today revealing the persistent of a vulvar lesion at the same site at the left inferior aspect of the labia majora.  A punch biopsy was performed.  The specimen was sent to pathology.  Good hemostasis was obtained with 2 separate stitches of Vicryl 4-0.  Postprocedure precautions were discussed.  Management per results.  2. Vulvar lesion As above.  Counseling on above issues and coordination of care more than 50% for 15 minutes.  [name] MD, 3:42 PM 05/15/2019

## 2019-05-16 DIAGNOSIS — M1712 Unilateral primary osteoarthritis, left knee: Secondary | ICD-10-CM | POA: Diagnosis not present

## 2019-05-20 ENCOUNTER — Telehealth: Payer: Self-pay | Admitting: *Deleted

## 2019-05-20 ENCOUNTER — Encounter: Payer: Self-pay | Admitting: Obstetrics & Gynecology

## 2019-05-20 NOTE — Patient Instructions (Signed)
1. 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  Wide excision performed at that level by Dr. Phineas Real on March 12, 2019.  Pathology confirmed CIN-3/CIS with one lateral margin positive.  Vulvar colposcopy done today revealing the persistent of a vulvar lesion at the same site at the left inferior aspect of the labia majora.  A punch biopsy was performed.  The specimen was sent to pathology.  Good hemostasis was obtained with 2 separate stitches of Vicryl 4-0.  Postprocedure precautions were discussed.  Management per results.  2. Vulvar lesion As above.  Miyo, it was a pleasure meeting you today!  I will inform you of your results as soon as they are available.

## 2019-05-20 NOTE — Telephone Encounter (Signed)
Message sent to Gyn-oncology for referral, they will call and schedule.

## 2019-05-20 NOTE — Telephone Encounter (Signed)
-----   Message from Ramond Craver, [place] sent at 05/20/2019 11:26 AM EST ----- Regarding: GYN ONC referral Per Dr. 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 "  Please arrange referral. Thanks!!!!

## 2019-05-22 ENCOUNTER — Telehealth: Payer: Self-pay | Admitting: *Deleted

## 2019-05-22 NOTE — Telephone Encounter (Signed)
Patient called back. Scheduled the patient for a new patient appt for 1/19. Gave address and policy for visitors, masks, and parking

## 2019-05-22 NOTE — Telephone Encounter (Signed)
Patient scheduled on 05/28/19 with Dr. Denman George.

## 2019-05-23 DIAGNOSIS — M1712 Unilateral primary osteoarthritis, left knee: Secondary | ICD-10-CM | POA: Diagnosis not present

## 2019-05-27 ENCOUNTER — Other Ambulatory Visit: Payer: Self-pay

## 2019-05-28 ENCOUNTER — Inpatient Hospital Stay: Payer: Medicare Other | Attending: Gynecologic Oncology | Admitting: Gynecologic Oncology

## 2019-05-28 ENCOUNTER — Other Ambulatory Visit: Payer: Self-pay

## 2019-05-28 ENCOUNTER — Encounter: Payer: Self-pay | Admitting: Gynecologic Oncology

## 2019-05-28 VITALS — BP 133/69 | HR 73 | Temp 98.3°F | Resp 16 | Ht 65.0 in | Wt 164.2 lb

## 2019-05-28 DIAGNOSIS — E785 Hyperlipidemia, unspecified: Secondary | ICD-10-CM | POA: Diagnosis not present

## 2019-05-28 DIAGNOSIS — Z79899 Other long term (current) drug therapy: Secondary | ICD-10-CM | POA: Insufficient documentation

## 2019-05-28 DIAGNOSIS — A630 Anogenital (venereal) warts: Secondary | ICD-10-CM | POA: Insufficient documentation

## 2019-05-28 DIAGNOSIS — F419 Anxiety disorder, unspecified: Secondary | ICD-10-CM | POA: Diagnosis not present

## 2019-05-28 DIAGNOSIS — M1990 Unspecified osteoarthritis, unspecified site: Secondary | ICD-10-CM | POA: Insufficient documentation

## 2019-05-28 DIAGNOSIS — Z7982 Long term (current) use of aspirin: Secondary | ICD-10-CM | POA: Insufficient documentation

## 2019-05-28 DIAGNOSIS — K900 Celiac disease: Secondary | ICD-10-CM | POA: Diagnosis not present

## 2019-05-28 DIAGNOSIS — K219 Gastro-esophageal reflux disease without esophagitis: Secondary | ICD-10-CM | POA: Insufficient documentation

## 2019-05-28 DIAGNOSIS — D071 Carcinoma in situ of vulva: Secondary | ICD-10-CM

## 2019-05-28 DIAGNOSIS — Z90710 Acquired absence of both cervix and uterus: Secondary | ICD-10-CM | POA: Diagnosis not present

## 2019-05-28 DIAGNOSIS — M8580 Other specified disorders of bone density and structure, unspecified site: Secondary | ICD-10-CM | POA: Insufficient documentation

## 2019-05-28 MED ORDER — OXYCODONE HCL 5 MG PO TABS: 5.0000 mg | ORAL_TABLET | ORAL | 0 refills | Status: DC | PRN

## 2019-05-28 MED ORDER — SENNOSIDES-DOCUSATE SODIUM 8.6-50 MG PO TABS: 2.0000 | ORAL_TABLET | Freq: Every day | ORAL | 1 refills | Status: DC

## 2019-05-28 MED ORDER — IBUPROFEN 600 MG PO TABS: 600.0000 mg | ORAL_TABLET | Freq: Four times a day (QID) | ORAL | 1 refills | Status: DC | PRN

## 2019-05-28 NOTE — H&P (View-Only) (Signed)
Consult Note: Gyn-Onc  Consult was requested by Dr. Dellis Filbert for the evaluation of Amanda [name] 69 y.o. female  CC:  Chief Complaint  Patient presents with  . VIN III (vulvar intraepithelial neoplasia III)    Assessment/Plan:  [name]  is a 69 y.o.  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  I am recommending excision wire a partial simple vulvectomy of the posterior left vulva.  I explained that depending on the results of this procedure she may require additional surgery in the future.  I explained that even with negative margins there is a 25% risk of recurrence of VIN, and with positive margins this likelihood exceeds 50%.  I explained that surgery is recommended as some of these cases can progress to malignancy and they typically symptomatic.  I explained the procedure in detail and postoperative care including pericare and postoperative analgesia.  I explained potential risk of the procedure including wound separation, infection, damage to adjacent structures, sexual dysfunction, anal sphincter stricture.   HPI: [name] is a [age] year old P2 who was seen in consultation at the request of Dr Dellis Filbert for evaluation of VIN 3.   The patient reported a history of VIN 3 in November 2020.  This was removed by Dr. Phineas Real with a wide local excision on March 12, 2019.  Pathology from that excision revealed VIN 3 with positive margins.  The patient was monitored in the postoperative period and developed symptomatic irritation and a raised area on the posterior left vulvar at the site of the prior excision.  This was biopsied on May 14, 2018 and revealed VIN 2-3.  The patient's medical history is remarkable for subdural hematoma in 2010.  She was recommended to take baby aspirin off that occurrence.  She has had a prior abdominal hysterectomy for fibroids.  She has known HPV related disease.  She is a non-smoker.  She has no  immunosuppressive illnesses and takes no immunosuppressive medications.  Current Meds:  Outpatient Encounter Medications as of 05/28/2019  Medication Sig  . APPLE CIDER VINEGAR PO Take 1 capsule by mouth 2 (two) times daily. Reported on 06/09/2015  . aspirin EC 81 MG tablet Take 81 mg by mouth daily.  [name] atorvastatin (LIPITOR) 20 MG tablet Take 20 mg by mouth at bedtime.  . bisacodyl (DULCOLAX) 5 MG EC tablet Take 5 mg by mouth daily as needed for moderate constipation.  . Black Currant Seed Oil 500 MG CAPS Take by mouth daily.  [name] CALCIUM-VITAMIN D PO Take 1 tablet by mouth daily.   . cholecalciferol (VITAMIN D3) 25 MCG (1000 UT) tablet Take 1,000 Units by mouth daily. Take 2 in the am  . clonazePAM (KLONOPIN) 1 MG tablet Take 1 tablet (1 mg total) by mouth at bedtime as needed (sleep). for anxiety  . CO ENZYME Q-10 PO Take by mouth daily.   . Cyanocobalamin (VITAMIN B-12 ER PO) Take 5,000 mcg by mouth daily.  . cyclobenzaprine (FLEXERIL) 10 MG tablet Take 1 tablet by mouth at bedtime.  . diclofenac sodium (VOLTAREN) 1 % GEL APPLY TOPICALLY TO AFFECTED AREA 4 TIMES A DAY  . ELDERBERRY PO Take by mouth. Take one daily  . escitalopram (LEXAPRO) 10 MG tablet Take 10 mg by mouth at bedtime.   . magnesium citrate SOLN Take 1 Bottle by mouth as needed.   . Melatonin 3 MG CAPS Take by mouth at bedtime as needed.   [name]  metoprolol succinate (TOPROL-XL) 25 MG 24 hr tablet Take 1 tablet (25 mg total) by mouth daily. (Patient taking differently: Take 25 mg by mouth at bedtime. )  . Multiple Vitamin (MULTIVITAMIN) tablet Take 1 tablet by mouth daily.    . NON FORMULARY Bausch and lomb ointment-Muro-//128, 5%ointment- Each eye nightly  . OIL OF OREGANO PO Take by mouth. Take one daily  . polyethylene glycol (MIRALAX / GLYCOLAX) packet Take 17 g by mouth daily as needed.   [name] Propylene Glycol (SYSTANE BALANCE) 0.6 % SOLN Apply 1 drop to eye 2 (two) times daily. For dry eyes, takes along with restasis  .  rOPINIRole (REQUIP) 0.25 MG tablet Take 1 tablet (0.25 mg total) by mouth at bedtime.  . valACYclovir (VALTREX) 500 MG tablet One tab bid as needed for outbreak   No facility-administered encounter medications on file as of 05/28/2019.    Allergy:  Allergies  Allergen Reactions  . Ambien [Zolpidem Tartrate] Other (See Comments)    Causes Neurological problems with very bad dizziness, pains, disorientation  . Cortisone Other (See Comments)    REACTION: "mania" post ESI  . Eszopiclone And Related Other (See Comments)    Causes Neurological problems with very bad dizziness, pains, disorientation  . Levofloxacin Other (See Comments)    Muscle spasms and jerking movements  . Dicloxacillin Other (See Comments)    Stomach problems, swelling of tongue  . Dilantin [Phenytoin Sodium Extended] Hives and Itching  . Erythromycin Other (See Comments)    Tears stomach up  . Avelox [Moxifloxacin Hcl In Nacl]     Pt unsure of reaction.  . Influenza Vaccines     Guillain Barre in the past- 20 years ago  . Other     Celiac disease  . Nitrofuran Derivatives Other (See Comments)    unknown  . Phenytoin Sodium Extended Rash  . Trazodone And Nefazodone Other (See Comments)    unknown    Social Hx:   Social History   Socioeconomic History  . Marital status: Married    Spouse name: Not on file  . Number of children: 2  . Years of education: Not on file  . Highest education level: Not on file  Occupational History  . Not on file  Tobacco Use  . Smoking status: Never Smoker  . Smokeless tobacco: Never Used  Substance and Sexual Activity  . Alcohol use: Yes    Alcohol/week: 7.0 standard drinks    Types: 7 Cans of beer per week    Comment: 1 beer nightly  . Drug use: Never  . Sexual activity: Yes    Partners: Male    Birth control/protection: Surgical, None    Comment: HYST-1st intercourse 69 yo-Fewer than 5 partners  Other Topics Concern  . Not on file  Social History Narrative    Married mother of 66 with 45 year old granddaughter (born in 2019)       Lives with husband in a three story home, no issue with stairs.    Highest level of education is an associates.       She exercises regularly, leads an active lifestyle, has a healthy diet, and is very socially engaged. Self-described "HSP: highly sensitive person" and therefore tends to worry more about symptoms than others might.      Hobbies: enjoys time with friends, time on front porch with friends during covid, park daily, family time   Social Determinants of Health   Financial Resource Strain:   . Difficulty  of Paying Living Expenses: Not on file  Food Insecurity:   . Worried About Charity fundraiser in the Last Year: Not on file  . Ran Out of Food in the Last Year: Not on file  Transportation Needs:   . Lack of Transportation (Medical): Not on file  . Lack of Transportation (Non-Medical): Not on file  Physical Activity:   . Days of Exercise per Week: Not on file  . Minutes of Exercise per Session: Not on file  Stress:   . Feeling of Stress : Not on file  Social Connections:   . Frequency of Communication with Friends and Family: Not on file  . Frequency of Social Gatherings with Friends and Family: Not on file  . Attends Religious Services: Not on file  . Active Member of Clubs or Organizations: Not on file  . Attends Archivist Meetings: Not on file  . Marital Status: Not on file  Intimate Partner Violence:   . Fear of Current or Ex-Partner: Not on file  . Emotionally Abused: Not on file  . Physically Abused: Not on file  . Sexually Abused: Not on file    Past Surgical Hx:  Past Surgical History:  Procedure Laterality Date  . ABDOMINAL HYSTERECTOMY  1986   ovaries remain  . BREAST REDUCTION SURGERY Bilateral 1999  . CATARACT EXTRACTION W/ INTRAOCULAR LENS  IMPLANT, BILATERAL  2019  . COLONOSCOPY  last one 11-22-2018  . ESOPHAGOGASTRODUODENOSCOPY    . KNEE ARTHROSCOPY Bilateral  2002  . SUBDURAL HEMATOMA EVACUATION VIA CRANIOTOMY  04-30-2009  @[facility]    left frontotemporapartietal   . THYROID LOBECTOMY Left 05-04-2001  @[facility]   dr Constance Holster  . VULVECTOMY N/A 03/12/2019   Procedure: WIDE LOCAL EXCISION OF CARCINOMA IS SITU OF VULVA;  Surgeon: Anastasio Auerbach, MD;  Location: [facility];  Service: Gynecology;  Laterality: N/A;  request to follow in [place] block time requests one hour colposcope available in OR with acetic acid.    Past Medical Hx:  Past Medical History:  Diagnosis Date  . Anxiety   . Celiac disease   . Chronic constipation   . GERD (gastroesophageal reflux disease)    occasionally (per pt takes apple cider vinager)  . Heart palpitations    cardiologist-- dr Ellyn Hack--- event monitor 08-24-2011 epic ;  nuclear stress test 03-16-2011 (epic) normal w/ no ischemia, ef 65%;  echo 01-18-2013  ef 60-65%, G1DD  . Hiatal hernia   . History of subdural hematoma    12/ 23/ 2010  s/p  craniotomy w/ hematoma evacuation (pt had a fall w/ concussion)  per pt no residual  . History of thyroid cancer followed by dr Constance Holster---   s/p  left thyroidectomy --- per pt no radiation and no recurrence  . Hyperlipidemia   . Insomnia   . OA (osteoarthritis)   . Osteopenia, T score -2.1 FRAX 9.4%/1.3% stable from prior DEXA 06/2015  . Restless leg syndrome   . Right thyroid nodule    followed by dr Constance Holster--- last ultrasound in epic 03-30-2018 stable , to bx  . STD (sexually transmitted disease), HSV   . 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 Wears glasses     Past Gynecological History:  See HPI No LMP recorded (lmp unknown). Patient has had a hysterectomy.  Family Hx:  Family History  Problem Relation Age of Onset  . Diabetes Maternal Aunt   . Breast cancer Maternal Aunt 60  . Liver cancer  Father        alcohol related likely  . Failure to thrive Mother        10  . Heart disease Maternal Grandfather   . Hyperlipidemia Maternal  Grandfather   . Hypertension Paternal Grandfather   . Colon cancer Neg Hx     Review of Systems:  Constitutional  Feels well,    ENT Normal appearing ears and nares bilaterally Skin/Breast  No rash, sores, jaundice, itching, dryness Cardiovascular  No chest pain, shortness of breath, or edema  Pulmonary  No cough or wheeze.  Gastro Intestinal  No nausea, vomitting, or diarrhoea. No bright red blood per rectum, no abdominal pain, change in bowel movement, or constipation.  Genito Urinary  No frequency, urgency, dysuria, + vulvar pruritis Musculo Skeletal  No myalgia, arthralgia, joint swelling or pain  Neurologic  No weakness, numbness, change in gait,  Psychology  No depression, anxiety, insomnia.   Vitals:  Blood pressure 133/69, pulse 73, temperature 98.3 F (36.8 C), temperature source Temporal, resp. rate 16, height 5' 5"  (1.651 m), weight 164 lb 4 oz (74.5 kg), SpO2 99 %.  Physical Exam: WD in NAD Neck  Supple NROM, without any enlargements.  Lymph Node Survey No cervical supraclavicular or inguinal adenopathy Cardiovascular  Pulse normal rate, regularity and rhythm. S1 and S2 normal.  Lungs  Clear to auscultation bilateraly, without wheezes/crackles/rhonchi. Good air movement.  Skin  No rash/lesions/breakdown  Psychiatry  Alert and oriented to person, place, and time  Abdomen  Normoactive bowel sounds, abdomen soft, non-tender and nonobese without evidence of hernia. Back No CVA tenderness Genito Urinary  Vulva/vagina: Normal external female genitalia.  No lesions. No discharge or bleeding. Rectal  Good tone, no masses no cul de sac nodularity.  Extremities  No bilateral cyanosis, clubbing or edema.  >45 mins was spent with this case, reviewing prior records, pathology notes, the patient's history and referring doctor notes. >50% of the time was spent in direct face to face counseling.   Thereasa Solo, MD  05/28/2019, 10:41 AM

## 2019-05-28 NOTE — Patient Instructions (Addendum)
Plan to have a wide local excision of the vulva at the [facility] on June 17, 2019.  You will receive a phone call from the pre-surgical RN to discuss instructions.  You will also have a COVID test 3 days prior to your procedure and they ask that you self quarantine yourself after the test and before your procedure so there is no chance of exposure. Please call for any questions or concerns.  Your after surgery medications have been prescribed so you can pick these up anytime prior to your procedure and keep them in a safe place so you will have them ready after surgery.  Plan to take tylenol and ibuprofen first for pain. You have the stronger narcotic medications if needed but you do not need to take them if you do not need them.  Also, plan to take the senna-kot S prescribed every night after surgery to prevent constipation. Do not take if having diarrhea.  You can stop taking this when you are back on your normal routine and not using pain medication.  Vulvectomy, Care After This sheet gives you information about how to care for yourself after your procedure. Your health care provider may also give you more specific instructions. If you have problems or questions, contact your health care provider. What can I expect after the procedure? After the procedure, it is common to have:  Vaginal pain.  Vaginal numbness.  Vaginal swelling.  Bloody vaginal discharge. Follow these instructions at home: Activity   Rest as told by your health care provider.  Do not lift, push, or pull more than 5 lb (2.3 kg), or the limit that you are told, until your health care provider says that it is safe.  Avoid activities that take a lot of effort for as long as told by your health care provider. This includes any exercise.  Raise (elevate) your legs while sitting or lying down.  Avoid standing or sitting in one place for long periods of time.  Do not Elmer Merwin your legs,  especially when sitting, until your health care provider approves.  Return to your normal activities as told by your health care provider. Ask your health care provider what activities are safe for you. Bathing   Do not take baths, swim, or use a hot tub until your health care provider approves. Ask your health care provider if you can take showers. You may only be allowed to take sponge baths.  After passing urine or a bowel movement, wipe yourself from front to back and clean your vaginal area using a spray bottle.  If told by your health care provider, take a sitz bath to help with discomfort. This is a warm water bath you take while sitting down. ? Do this 3-4 times a day, or as often as told by your health care provider. ? The water should only come up to your hips and cover your buttocks. ? You may pat the area dry with a soft, clean towel. ? If needed, you may then gently dry the area with a hair dryer on a cool setting for 5-10 minutes. An enclosed box fan may also be used to gently dry the area. Incision care   Follow instructions from your health care provider about how to take care of your incision areas. Make sure you: ? Leave stitches (sutures), skin glue, adhesive strips, or surgical clips in place. These skin closures may need to stay in place for 2 weeks or longer.  If adhesive strip edges start to loosen and curl up, you may trim the loose edges. Do not remove adhesive strips completely unless your health care provider tells you to do that.  Check your incision areas every day for signs of infection. It may be helpful to use a handheld mirror to do this. Check for: ? Redness, swelling, or pain that has gotten worse. ? More fluid or blood. ? Warmth. ? Pus or a bad smell.  If you were sent home with a drain, take care of it as told by your health care provider. Lifestyle  Do not douche or use tampons until your health care provider approves.  Do not have sex until your  health care provider approves. Tell your health care provider if you have pain or numbness when you return to sexual activity.  Wear cotton underwear and comfortable, loose-fitting clothing. Medicines  Take over-the-counter and prescription medicines only as told by your health care provider.  Ask your health care provider if the medicine prescribed to you: ? Requires you to avoid driving or using heavy machinery. ? Can cause constipation. You may need to take these actions to prevent or treat constipation:  Take over-the-counter or prescription medicines.  Eat foods that are high in fiber, such as beans, whole grains, and fresh fruits and vegetables.  Limit foods that are high in fat and processed sugars, such as fried or sweet foods. General instructions  Do not drive until your health care provider says that it is safe.  Drink enough fluid to keep your urine pale yellow.  Wear compression stockings as told by your health care provider. These stockings help to prevent blood clots and reduce swelling in your legs.  Keep all follow-up visits as told by your health care provider. This is important. Contact a health care provider if:  You have any of these problems in the incision area: ? More redness, swelling, or pain around the incision. ? More fluid or blood coming from the incision. ? Pus or a bad smell coming from the incision. ? The incision feels warm to the touch. ? The incision breaks open.  You have a fever.  You have painful or bloody urination.  You feel nauseous or you vomit.  You have diarrhea.  You develop constipation.  You develop a rash.  You feel dizzy or light-headed.  You have pain that does not get better with medicine. Get help right away if you:  Faint.  Have leg or chest pain.  Have abdominal pain.  Have shortness of breath. Summary  After the procedure, it is common to have some pain, numbness, or swelling in the vaginal area. It is  also common to have some bloody vaginal discharge.  Do not lift, push, or pull more than 5 lb (2.3 kg), or the limit that you are told, or engage in activities that take a lot of effort until your health care provider approves.  Follow instructions from your health care provider about how to take care of your incision.  Keep all follow-up visits as told by your health care provider. This is important. This information is not intended to replace advice given to you by your health care provider. Make sure you discuss any questions you have with your health care provider. Document Revised: 05/22/2018 Document Reviewed: 05/23/2018 Elsevier Patient Education  2020 [employer].

## 2019-05-28 NOTE — Progress Notes (Signed)
Consult Note: Gyn-Onc  Consult was requested by Dr. Dellis Filbert for the evaluation of Amanda [name] 69 y.o. female  CC:  Chief Complaint  Patient presents with  . VIN III (vulvar intraepithelial neoplasia III)    Assessment/Plan:  Amanda [name]  is a 69 y.o.  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  I am recommending excision wire a partial simple vulvectomy of the posterior left vulva.  I explained that depending on the results of this procedure she may require additional surgery in the future.  I explained that even with negative margins there is a 25% risk of recurrence of VIN, and with positive margins this likelihood exceeds 50%.  I explained that surgery is recommended as some of these cases can progress to malignancy and they typically symptomatic.  I explained the procedure in detail and postoperative care including pericare and postoperative analgesia.  I explained potential risk of the procedure including wound separation, infection, damage to adjacent structures, sexual dysfunction, anal sphincter stricture.   HPI: [name] is a 69 year old P2 who was seen in consultation at the request of Dr Dellis Filbert for evaluation of VIN 3.   The patient reported a history of VIN 3 in November 2020.  This was removed by Dr. Phineas Real with a wide local excision on March 12, 2019.  Pathology from that excision revealed VIN 3 with positive margins.  The patient was monitored in the postoperative period and developed symptomatic irritation and a raised area on the posterior left vulvar at the site of the prior excision.  This was biopsied on May 14, 2018 and revealed VIN 2-3.  The patient's medical history is remarkable for subdural hematoma in 2010.  She was recommended to take baby aspirin off that occurrence.  She has had a prior abdominal hysterectomy for fibroids.  She has known HPV related disease.  She is a non-smoker.  She has no  immunosuppressive illnesses and takes no immunosuppressive medications.  Current Meds:  Outpatient Encounter Medications as of 05/28/2019  Medication Sig  . APPLE CIDER VINEGAR PO Take 1 capsule by mouth 2 (two) times daily. Reported on 06/09/2015  . aspirin EC 81 MG tablet Take 81 mg by mouth daily.  [name] atorvastatin (LIPITOR) 20 MG tablet Take 20 mg by mouth at bedtime.  . bisacodyl (DULCOLAX) 5 MG EC tablet Take 5 mg by mouth daily as needed for moderate constipation.  . Black Currant Seed Oil 500 MG CAPS Take by mouth daily.  [name] CALCIUM-VITAMIN D PO Take 1 tablet by mouth daily.   . cholecalciferol (VITAMIN D3) 25 MCG (1000 UT) tablet Take 1,000 Units by mouth daily. Take 2 in the am  . clonazePAM (KLONOPIN) 1 MG tablet Take 1 tablet (1 mg total) by mouth at bedtime as needed (sleep). for anxiety  . CO ENZYME Q-10 PO Take by mouth daily.   . Cyanocobalamin (VITAMIN B-12 ER PO) Take 5,000 mcg by mouth daily.  . cyclobenzaprine (FLEXERIL) 10 MG tablet Take 1 tablet by mouth at bedtime.  . diclofenac sodium (VOLTAREN) 1 % GEL APPLY TOPICALLY TO AFFECTED AREA 4 TIMES A DAY  . ELDERBERRY PO Take by mouth. Take one daily  . escitalopram (LEXAPRO) 10 MG tablet Take 10 mg by mouth at bedtime.   . magnesium citrate SOLN Take 1 Bottle by mouth as needed.   . Melatonin 3 MG CAPS Take by mouth at bedtime as needed.   [name]  metoprolol succinate (TOPROL-XL) 25 MG 24 hr tablet Take 1 tablet (25 mg total) by mouth daily. (Patient taking differently: Take 25 mg by mouth at bedtime. )  . Multiple Vitamin (MULTIVITAMIN) tablet Take 1 tablet by mouth daily.    . NON FORMULARY Bausch and lomb ointment-Muro-//128, 5%ointment- Each eye nightly  . OIL OF OREGANO PO Take by mouth. Take one daily  . polyethylene glycol (MIRALAX / GLYCOLAX) packet Take 17 g by mouth daily as needed.   [name] Propylene Glycol (SYSTANE BALANCE) 0.6 % SOLN Apply 1 drop to eye 2 (two) times daily. For dry eyes, takes along with restasis  .  rOPINIRole (REQUIP) 0.25 MG tablet Take 1 tablet (0.25 mg total) by mouth at bedtime.  . valACYclovir (VALTREX) 500 MG tablet One tab bid as needed for outbreak   No facility-administered encounter medications on file as of 05/28/2019.    Allergy:  Allergies  Allergen Reactions  . Ambien [Zolpidem Tartrate] Other (See Comments)    Causes Neurological problems with very bad dizziness, pains, disorientation  . Cortisone Other (See Comments)    REACTION: "mania" post ESI  . Eszopiclone And Related Other (See Comments)    Causes Neurological problems with very bad dizziness, pains, disorientation  . Levofloxacin Other (See Comments)    Muscle spasms and jerking movements  . Dicloxacillin Other (See Comments)    Stomach problems, swelling of tongue  . Dilantin [Phenytoin Sodium Extended] Hives and Itching  . Erythromycin Other (See Comments)    Tears stomach up  . Avelox [Moxifloxacin Hcl In Nacl]     Pt unsure of reaction.  . Influenza Vaccines     Guillain Barre in the past- 20 years ago  . Other     Celiac disease  . Nitrofuran Derivatives Other (See Comments)    unknown  . Phenytoin Sodium Extended Rash  . Trazodone And Nefazodone Other (See Comments)    unknown    Social Hx:   Social History   Socioeconomic History  . Marital status: Married    Spouse name: Not on file  . Number of children: 2  . Years of education: Not on file  . Highest education level: Not on file  Occupational History  . Not on file  Tobacco Use  . Smoking status: Never Smoker  . Smokeless tobacco: Never Used  Substance and Sexual Activity  . Alcohol use: Yes    Alcohol/week: 7.0 standard drinks    Types: 7 Cans of beer per week    Comment: 1 beer nightly  . Drug use: Never  . Sexual activity: Yes    Partners: Male    Birth control/protection: Surgical, None    Comment: HYST-1st intercourse 69 yo-Fewer than 5 partners  Other Topics Concern  . Not on file  Social History Narrative    Married mother of 30 with 44 year old granddaughter (born in 2019)       Lives with husband in a three story home, no issue with stairs.    Highest level of education is an associates.       She exercises regularly, leads an active lifestyle, has a healthy diet, and is very socially engaged. Self-described "HSP: highly sensitive person" and therefore tends to worry more about symptoms than others might.      Hobbies: enjoys time with friends, time on front porch with friends during covid, park daily, family time   Social Determinants of Health   Financial Resource Strain:   . Difficulty  of Paying Living Expenses: Not on file  Food Insecurity:   . Worried About Charity fundraiser in the Last Year: Not on file  . Ran Out of Food in the Last Year: Not on file  Transportation Needs:   . Lack of Transportation (Medical): Not on file  . Lack of Transportation (Non-Medical): Not on file  Physical Activity:   . Days of Exercise per Week: Not on file  . Minutes of Exercise per Session: Not on file  Stress:   . Feeling of Stress : Not on file  Social Connections:   . Frequency of Communication with Friends and Family: Not on file  . Frequency of Social Gatherings with Friends and Family: Not on file  . Attends Religious Services: Not on file  . Active Member of Clubs or Organizations: Not on file  . Attends Archivist Meetings: Not on file  . Marital Status: Not on file  Intimate Partner Violence:   . Fear of Current or Ex-Partner: Not on file  . Emotionally Abused: Not on file  . Physically Abused: Not on file  . Sexually Abused: Not on file    Past Surgical Hx:  Past Surgical History:  Procedure Laterality Date  . ABDOMINAL HYSTERECTOMY  1986   ovaries remain  . BREAST REDUCTION SURGERY Bilateral 1999  . CATARACT EXTRACTION W/ INTRAOCULAR LENS  IMPLANT, BILATERAL  2019  . COLONOSCOPY  last one 11-22-2018  . ESOPHAGOGASTRODUODENOSCOPY    . KNEE ARTHROSCOPY Bilateral  2002  . SUBDURAL HEMATOMA EVACUATION VIA CRANIOTOMY  04-30-2009  @[facility]    left frontotemporapartietal   . THYROID LOBECTOMY Left 05-04-2001  @[facility]   dr Constance Holster  . VULVECTOMY N/A 03/12/2019   Procedure: WIDE LOCAL EXCISION OF CARCINOMA IS SITU OF VULVA;  Surgeon: Anastasio Auerbach, MD;  Location: [facility];  Service: Gynecology;  Laterality: N/A;  request to follow in [place] block time requests one hour colposcope available in OR with acetic acid.    Past Medical Hx:  Past Medical History:  Diagnosis Date  . Anxiety   . Celiac disease   . Chronic constipation   . GERD (gastroesophageal reflux disease)    occasionally (per pt takes apple cider vinager)  . Heart palpitations    cardiologist-- dr Ellyn Hack--- event monitor 08-24-2011 epic ;  nuclear stress test 03-16-2011 (epic) normal w/ no ischemia, ef 65%;  echo 01-18-2013  ef 60-65%, G1DD  . Hiatal hernia   . History of subdural hematoma    12/ 23/ 2010  s/p  craniotomy w/ hematoma evacuation (pt had a fall w/ concussion)  per pt no residual  . History of thyroid cancer followed by dr Constance Holster---   s/p  left thyroidectomy --- per pt no radiation and no recurrence  . Hyperlipidemia   . Insomnia   . OA (osteoarthritis)   . Osteopenia, T score -2.1 FRAX 9.4%/1.3% stable from prior DEXA 06/2015  . Restless leg syndrome   . Right thyroid nodule    followed by dr Constance Holster--- last ultrasound in epic 03-30-2018 stable , to bx  . STD (sexually transmitted disease), HSV   . 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 Wears glasses     Past Gynecological History:  See HPI No LMP recorded (lmp unknown). Patient has had a hysterectomy.  Family Hx:  Family History  Problem Relation Age of Onset  . Diabetes Maternal Aunt   . Breast cancer Maternal Aunt 60  . Liver cancer  Father        alcohol related likely  . Failure to thrive Mother        87  . Heart disease Maternal Grandfather   . Hyperlipidemia Maternal  Grandfather   . Hypertension Paternal Grandfather   . Colon cancer Neg Hx     Review of Systems:  Constitutional  Feels well,    ENT Normal appearing ears and nares bilaterally Skin/Breast  No rash, sores, jaundice, itching, dryness Cardiovascular  No chest pain, shortness of breath, or edema  Pulmonary  No cough or wheeze.  Gastro Intestinal  No nausea, vomitting, or diarrhoea. No bright red blood per rectum, no abdominal pain, change in bowel movement, or constipation.  Genito Urinary  No frequency, urgency, dysuria, + vulvar pruritis Musculo Skeletal  No myalgia, arthralgia, joint swelling or pain  Neurologic  No weakness, numbness, change in gait,  Psychology  No depression, anxiety, insomnia.   Vitals:  Blood pressure 133/69, pulse 73, temperature 98.3 F (36.8 C), temperature source Temporal, resp. rate 16, height 5' 5"  (1.651 m), weight 164 lb 4 oz (74.5 kg), SpO2 99 %.  Physical Exam: WD in NAD Neck  Supple NROM, without any enlargements.  Lymph Node Survey No cervical supraclavicular or inguinal adenopathy Cardiovascular  Pulse normal rate, regularity and rhythm. S1 and S2 normal.  Lungs  Clear to auscultation bilateraly, without wheezes/crackles/rhonchi. Good air movement.  Skin  No rash/lesions/breakdown  Psychiatry  Alert and oriented to person, place, and time  Abdomen  Normoactive bowel sounds, abdomen soft, non-tender and nonobese without evidence of hernia. Back No CVA tenderness Genito Urinary  Vulva/vagina: Normal external female genitalia.  No lesions. No discharge or bleeding. Rectal  Good tone, no masses no cul de sac nodularity.  Extremities  No bilateral cyanosis, clubbing or edema.  >45 mins was spent with this case, reviewing prior records, pathology notes, the patient's history and referring doctor notes. >50% of the time was spent in direct face to face counseling.   Thereasa Solo, MD  05/28/2019, 10:41 AM

## 2019-06-04 ENCOUNTER — Ambulatory Visit (INDEPENDENT_AMBULATORY_CARE_PROVIDER_SITE_OTHER): Payer: Medicare Other | Admitting: Family Medicine

## 2019-06-04 ENCOUNTER — Ambulatory Visit: Payer: Medicare Other | Admitting: Physician Assistant

## 2019-06-04 ENCOUNTER — Encounter: Payer: Self-pay | Admitting: Family Medicine

## 2019-06-04 ENCOUNTER — Other Ambulatory Visit: Payer: Self-pay

## 2019-06-04 VITALS — BP 120/82 | HR 67 | Temp 97.7°F | Ht 65.0 in | Wt 165.0 lb

## 2019-06-04 DIAGNOSIS — E785 Hyperlipidemia, unspecified: Secondary | ICD-10-CM

## 2019-06-04 DIAGNOSIS — R8290 Unspecified abnormal findings in urine: Secondary | ICD-10-CM

## 2019-06-04 DIAGNOSIS — R5383 Other fatigue: Secondary | ICD-10-CM

## 2019-06-04 DIAGNOSIS — R42 Dizziness and giddiness: Secondary | ICD-10-CM

## 2019-06-04 DIAGNOSIS — E162 Hypoglycemia, unspecified: Secondary | ICD-10-CM | POA: Diagnosis not present

## 2019-06-04 DIAGNOSIS — E8881 Metabolic syndrome: Secondary | ICD-10-CM | POA: Diagnosis not present

## 2019-06-04 LAB — POC URINALSYSI DIPSTICK (AUTOMATED)
Bilirubin, UA: NEGATIVE
Blood, UA: NEGATIVE
Glucose, UA: NEGATIVE
Ketones, UA: NEGATIVE
Leukocytes, UA: NEGATIVE
Nitrite, UA: NEGATIVE
Protein, UA: NEGATIVE
Spec Grav, UA: 1.01 (ref 1.010–1.025)
Urobilinogen, UA: 0.2 E.U./dL
pH, UA: 7.5 (ref 5.0–8.0)

## 2019-06-04 LAB — CBC WITH DIFFERENTIAL/PLATELET
Basophils Absolute: 0 10*3/uL (ref 0.0–0.1)
Basophils Relative: 0.7 % (ref 0.0–3.0)
Eosinophils Absolute: 0.6 10*3/uL (ref 0.0–0.7)
Eosinophils Relative: 10.9 % — ABNORMAL HIGH (ref 0.0–5.0)
HCT: 37.2 % (ref 36.0–46.0)
Hemoglobin: 12.5 g/dL (ref 12.0–15.0)
Lymphocytes Relative: 21.7 % (ref 12.0–46.0)
Lymphs Abs: 1.2 10*3/uL (ref 0.7–4.0)
MCHC: 33.6 g/dL (ref 30.0–36.0)
MCV: 91.7 fl (ref 78.0–100.0)
Monocytes Absolute: 0.3 10*3/uL (ref 0.1–1.0)
Monocytes Relative: 6.3 % (ref 3.0–12.0)
Neutro Abs: 3.2 10*3/uL (ref 1.4–7.7)
Neutrophils Relative %: 60.4 % (ref 43.0–77.0)
Platelets: 225 10*3/uL (ref 150.0–400.0)
RBC: 4.05 Mil/uL (ref 3.87–5.11)
RDW: 13 % (ref 11.5–15.5)
WBC: 5.4 10*3/uL (ref 4.0–10.5)

## 2019-06-04 LAB — COMPREHENSIVE METABOLIC PANEL
ALT: 15 U/L (ref 0–35)
AST: 21 U/L (ref 0–37)
Albumin: 4.7 g/dL (ref 3.5–5.2)
Alkaline Phosphatase: 78 U/L (ref 39–117)
BUN: 22 mg/dL (ref 6–23)
CO2: 31 mEq/L (ref 19–32)
Calcium: 9.8 mg/dL (ref 8.4–10.5)
Chloride: 103 mEq/L (ref 96–112)
Creatinine, Ser: 0.86 mg/dL (ref 0.40–1.20)
GFR: 65.4 mL/min (ref >60.00)
Glucose, Bld: 92 mg/dL (ref 70–99)
Potassium: 4.3 mEq/L (ref 3.5–5.1)
Sodium: 140 mEq/L (ref 135–145)
Total Bilirubin: 0.6 mg/dL (ref 0.2–1.2)
Total Protein: 7.2 g/dL (ref 6.0–8.3)

## 2019-06-04 LAB — T3, FREE: T3, Free: 3 pg/mL (ref 2.3–4.2)

## 2019-06-04 LAB — HEMOGLOBIN A1C: Hgb A1c MFr Bld: 5.7 % (ref 4.6–6.5)

## 2019-06-04 LAB — TSH: TSH: 1.57 u[IU]/mL (ref 0.35–4.50)

## 2019-06-04 LAB — T4, FREE: Free T4: 0.78 ng/dL (ref 0.60–1.60)

## 2019-06-04 NOTE — Patient Instructions (Addendum)
Look into potential causes of fatigue as well as episode from last week  Please stop by lab before you go If you do not have mychart- we will call you about results within 5 business days of [place] receiving them.  If you have mychart- we will send your results within 3 business days of [place] receiving them.  If abnormal or we want to clarify a result, we will call or mychart you to make sure you receive the message.  If you have questions or concerns or don't hear within 5-7 days, please send [place] a message or call us.   I want you to keep a journal with how often this happens and bring to next visit. Also try to check blood sugar (team please give her a monitor) and blood pressure and heart rate/pulse when this occurs. If you have new or worsening symptoms please see [place] back ASAP  Truly low blood sugar is under 70 but some people will feel sensitive under 80 or so.   Ferrous sulfate 325 mg once a week

## 2019-06-04 NOTE — Addendum Note (Signed)
Addended by: Francis Dowse T on: 06/04/2019 09:05 AM   Modules accepted: Orders

## 2019-06-04 NOTE — Progress Notes (Signed)
Phone [phone] In person visit   Subjective:   Amanda [name] is a 69 y.o. year old very pleasant female patient who presents for/with See problem oriented charting Chief Complaint  Patient presents with  . Follow-up  . Dizziness  . shakiness    This visit occurred during the SARS-CoV-2 public health emergency.  Safety protocols were in place, including screening questions prior to the visit, additional usage of staff PPE, and extensive cleaning of exam room while observing appropriate contact time as indicated for disinfecting solutions.   Past Medical History-  Patient Active Problem List   Diagnosis Date Noted  . History of Guillain-Barre syndrome 04/01/2018    Priority: High  . Thyroid nodule 03/27/2018    Priority: High  . Celiac disease 11/27/2007    Priority: High  . Sleep disorder 07/05/2007    Priority: High  . History of thyroid cancer 10/03/2006    Priority: High  . History of subdural hematoma 03/26/2019    Priority: Medium  . Herpes simplex type 1 infection 11/01/2018    Priority: Medium  . Hyperlipidemia, unspecified 03/30/2018    Priority: Medium  . Fibromyalgia 03/29/2018    Priority: Medium  . Fuchs' corneal dystrophy, 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    Priority: Medium  . Insulin resistance 03/29/2018    Priority: Medium  . Grade I diastolic dysfunction 09/81/1914    Priority: Medium  . Dysthymia 09/26/2016    Priority: Medium  . Anxiety, prn Klonopin 08/03/2016    Priority: Medium  . Palpitations 01/17/2013    Priority: Medium  . GERD (gastroesophageal reflux disease)     Priority: Medium  . Restless leg syndrome, on Requip     Priority: Medium  . IBS (irritable bowel syndrome) 02/16/2009    Priority: Medium  . Osteoarthritis 07/05/2007    Priority: Medium  . Osteopenia 10/03/2006    Priority: Medium  . Mastalgia, left, followed by GYN 03/29/2018    Priority: Low  . History of hysterectomy 03/29/2018    Priority: Low  .  Presbycusis of both ears 03/10/2017    Priority: Low  . Allergic rhinitis 06/25/2009    Priority: Low  . Diaphragmatic hernia 02/16/2009    Priority: Low  . Left carotid bruit 10/03/2006    Priority: Low    Medications- reviewed and updated Current Outpatient Medications  Medication Sig Dispense Refill  . APPLE CIDER VINEGAR PO Take 1 capsule by mouth 2 (two) times daily. Reported on 06/09/2015    . aspirin EC 81 MG tablet Take 81 mg by mouth daily.    [name] atorvastatin (LIPITOR) 20 MG tablet Take 20 mg by mouth at bedtime.    . bisacodyl (DULCOLAX) 5 MG EC tablet Take 5 mg by mouth daily as needed for moderate constipation.    . Black Currant Seed Oil 500 MG CAPS Take by mouth daily.    [name] CALCIUM-VITAMIN D PO Take 1 tablet by mouth daily.     . cholecalciferol (VITAMIN D3) 25 MCG (1000 UT) tablet Take 1,000 Units by mouth daily. Take 2 in the am    . clonazePAM (KLONOPIN) 1 MG tablet Take 1 tablet (1 mg total) by mouth at bedtime as needed (sleep). for anxiety 90 tablet 0  . CO ENZYME Q-10 PO Take by mouth daily.     . Cyanocobalamin (VITAMIN B-12 ER PO) Take 5,000 mcg by mouth daily.    . diclofenac sodium (VOLTAREN) 1 % GEL APPLY TOPICALLY TO AFFECTED AREA 4 TIMES A  DAY 100 g 1  . ELDERBERRY PO Take by mouth. Take one daily    . ibuprofen (ADVIL) 600 MG tablet Take 1 tablet (600 mg total) by mouth every 6 (six) hours as needed for moderate pain. For AFTER surgery 30 tablet 1  . magnesium citrate SOLN Take 1 Bottle by mouth as needed.     . Melatonin 3 MG CAPS Take by mouth at bedtime as needed.     . metoprolol succinate (TOPROL-XL) 25 MG 24 hr tablet Take 1 tablet (25 mg total) by mouth daily. (Patient taking differently: Take 25 mg by mouth at bedtime. ) 90 tablet 3  . Multiple Vitamin (MULTIVITAMIN) tablet Take 1 tablet by mouth daily.      . NON FORMULARY Bausch and lomb ointment-Muro-//128, 5%ointment- Each eye nightly    . OIL OF OREGANO PO Take by mouth. Take one daily    .  oxyCODONE (OXY IR/ROXICODONE) 5 MG immediate release tablet Take 1 tablet (5 mg total) by mouth every 4 (four) hours as needed for severe pain. For AFTER surgery only, do not take and drive 30 tablet 0  . polyethylene glycol (MIRALAX / GLYCOLAX) packet Take 17 g by mouth daily as needed.     [name] Propylene Glycol (SYSTANE BALANCE) 0.6 % SOLN Apply 1 drop to eye 2 (two) times daily. For dry eyes, takes along with restasis    . rOPINIRole (REQUIP) 0.25 MG tablet Take 1 tablet (0.25 mg total) by mouth at bedtime. 90 tablet 3  . senna-docusate (SENOKOT-S) 8.6-50 MG tablet Take 2 tablets by mouth at bedtime. For AFTER surgery, do not take if having diarrhea 30 tablet 1  . valACYclovir (VALTREX) 500 MG tablet One tab bid as needed for outbreak 180 tablet 1   No current facility-administered medications for this visit.     Objective:  BP 120/82   Pulse 67   Temp 97.7 F (36.5 C)   Ht 5' 5"  (1.651 m)   Wt 165 lb (74.8 kg)   LMP  (LMP Unknown)   SpO2 97%   BMI 27.46 kg/m  Gen: NAD, resting comfortably No thyromegaly on the right-thyroid surgically absent on the left CV: RRR no murmurs rubs or gallops Lungs: CTAB no crackles, wheeze, rhonchi Ext: no edema Skin: warm, dry    Assessment and Plan  #Dizziness/Shakiness #Insulin resistance S:Pt states last week she was walking up the stairs and started feeling dizzy and nauseous and sweaty after got up the stairs - also  in the shower and when she got out. Did not go from chair to stairs so not clearly orthostatic.  She laid on the bed and put fan on and that helped.  She states she has had issues with this in the past and she thinks its sugar related- also felt fatigued.  She denies any recent episodes and she says her urine smells like "sugary popcorn". Denies issues since that time but does feel like overall has been more tired for several months. Tries to stay hydrated- drinks 5 cups of water but not sure about total volume or volume for each cup-  had dehydration issues in past. No palpitations or shortness of breath. No fever. Did have some slight chills with episode. Symptoms got better after rest then going downstairs and drinking almond milk. Did feel somewhat faint.  She had eaten pretty well that day but had not eaten in 3 hours or so.  No fall or injury.  Patient has had elevated A1c  in the past at 5.8 in May 2020  A/P: Patient with recurrent mild dizziness episodes over the years.  Could be dehydration related-encouraged her to make sure to remain well-hydrated.  She also has worried about being blood sugar related-we will get A1c and also give her a meter to monitor at home as below.  Also has some fatigue for several months-she requests full updated blood work and we also scheduled that as below -ekg largely reassuring in October.  I do not think further cardiac work-up is warranted as appears symptoms largely stable over the last few years in regards to dizziness - echocardiogram 2014 largely reassuring and has had these issues for years-diastolic dysfunction but I do not think this would be the cause of her symptoms - encouraged to keep a journal -will give meter to check blood sugar during future events. She can also use a home blodo pressure cuff to check heart rate and blood pressure during episodes.  -We discussed targeting at least 50 to 60 ounces of water a day.  She also deals with constipation and discussed this may help-if not she can try half capful of MiraLAX daily.  Has used enemas in the past  #hyperlipidemia S: compliant with atorvastatin-I believe she only takes 10 mg actually Lab Results  Component Value Date   CHOL 178 01/22/2019   HDL 70.70 01/22/2019   LDLCALC 92 01/22/2019   LDLDIRECT 139.2 03/10/2008   TRIG 78.0 01/22/2019   CHOLHDL 3 01/22/2019   A/P: Reasonable control-has had side effects with higher doses in the past so we will just target LDL under 100.  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  Did not feel nodule in the right today  Recommended follow up: She is going to cancel visit on February 12-recommended 6 months follow-up or sooner if needed verbally Future Appointments  Date Time Provider [place]  06/21/2019 10:40 AM Marin Olp, MD [facility]  07/09/2019  1:30 PM Everitt Amber, MD [facility] None    Lab/Order associations: Not fasting   ICD-10-CM   1. Fatigue, unspecified type  R53.83 CBC with Differential/Platelet    Comprehensive metabolic panel    TSH    T3, free    T4, free  2. Hyperlipidemia, unspecified hyperlipidemia type  E78.5   3. Insulin resistance  E88.81 Hemoglobin A1c  4. Abnormal urine odor  R82.90 POCT Urinalysis Dipstick (Automated)  5. Hypoglycemia  E16.2 Hemoglobin A1c  6. Dizziness  R42    Time Spent: 31 minutes of total time (8:24 AM- 8:55 AM) was spent on the date of the encounter performing the following actions: chart review prior to seeing the patient, obtaining history, performing a medically necessary exam, counseling on the treatment plan, placing orders, and documenting in our EHR.   Return precautions advised.  Garret Reddish, MD

## 2019-06-06 ENCOUNTER — Telehealth: Payer: Self-pay

## 2019-06-06 NOTE — Telephone Encounter (Signed)
Amanda [name] wanted to see if it were at all possible to go walking after her [place]. Told Amanda [name] that walking /exercise with in the first month post op can cause the wound to separate, increase pain, slow healing per Dr. Denman George. She needs to listen to her body. She can go outside to get fresh air and walk slowly near the house but not power walk. Pt verbalized understanding.

## 2019-06-07 ENCOUNTER — Ambulatory Visit: Payer: Medicare Other

## 2019-06-09 DIAGNOSIS — Z23 Encounter for immunization: Secondary | ICD-10-CM

## 2019-06-09 HISTORY — DX: Encounter for immunization: Z23

## 2019-06-11 ENCOUNTER — Encounter (HOSPITAL_BASED_OUTPATIENT_CLINIC_OR_DEPARTMENT_OTHER): Payer: Self-pay | Admitting: Gynecologic Oncology

## 2019-06-11 ENCOUNTER — Other Ambulatory Visit: Payer: Self-pay

## 2019-06-11 NOTE — Progress Notes (Addendum)
Spoke w/ via phone for pre-op interview---[facility] needs dos----    I stat 8        Lab results------ekg 02-27-2020, lov cardiology 02-27-2019 epic [facility] np chart/epic COVID test ------06-13-2019 Arrive at -------600 am 06-17-2019 NPO after ------midnight food, clear liquids until 500 am then npo. Medications to take morning of surgery -----none Diabetic medication -----n/a Patient Special Instructions ----- Pre-Op special Istructions ----- Patient verbalized understanding of instructions that were given at this phone interview. Patient denies shortness of breath, chest pain, fever, cough a this phone interview.

## 2019-06-12 DIAGNOSIS — M1712 Unilateral primary osteoarthritis, left knee: Secondary | ICD-10-CM | POA: Diagnosis not present

## 2019-06-12 DIAGNOSIS — M79605 Pain in left leg: Secondary | ICD-10-CM | POA: Diagnosis not present

## 2019-06-12 DIAGNOSIS — M25562 Pain in left knee: Secondary | ICD-10-CM | POA: Diagnosis not present

## 2019-06-12 DIAGNOSIS — M7652 Patellar tendinitis, left knee: Secondary | ICD-10-CM | POA: Diagnosis not present

## 2019-06-13 ENCOUNTER — Other Ambulatory Visit (HOSPITAL_COMMUNITY)
Admission: RE | Admit: 2019-06-13 | Discharge: 2019-06-13 | Disposition: A | Discharge status: Home / Self Care | Payer: Medicare Other | Source: Ambulatory Visit | Attending: Gynecologic Oncology | Admitting: Gynecologic Oncology

## 2019-06-13 DIAGNOSIS — Z20822 Contact with and (suspected) exposure to covid-19: Secondary | ICD-10-CM | POA: Insufficient documentation

## 2019-06-13 DIAGNOSIS — Z01812 Encounter for preprocedural laboratory examination: Secondary | ICD-10-CM | POA: Insufficient documentation

## 2019-06-13 LAB — SARS CORONAVIRUS 2 (TAT 6-24 HRS): SARS Coronavirus 2: NEGATIVE

## 2019-06-14 ENCOUNTER — Telehealth: Payer: Self-pay

## 2019-06-14 NOTE — Telephone Encounter (Signed)
LM for Ms Faxon to call the office at [phone] if she has any questions or concerns regarding her pre-op instructions for 06-17-19.

## 2019-06-15 ENCOUNTER — Ambulatory Visit: Payer: Medicare Other

## 2019-06-17 ENCOUNTER — Ambulatory Visit (HOSPITAL_BASED_OUTPATIENT_CLINIC_OR_DEPARTMENT_OTHER)
Admission: RE | Admit: 2019-06-17 | Discharge: 2019-06-17 | Disposition: A | Discharge status: Home / Self Care | Payer: Medicare Other | Attending: Gynecologic Oncology | Admitting: Gynecologic Oncology

## 2019-06-17 ENCOUNTER — Encounter (HOSPITAL_BASED_OUTPATIENT_CLINIC_OR_DEPARTMENT_OTHER)
Admission: RE | Disposition: A | Discharge status: Home / Self Care | Payer: Self-pay | Source: Home / Self Care | Attending: Gynecologic Oncology

## 2019-06-17 ENCOUNTER — Ambulatory Visit (HOSPITAL_BASED_OUTPATIENT_CLINIC_OR_DEPARTMENT_OTHER): Payer: Medicare Other | Admitting: Anesthesiology

## 2019-06-17 ENCOUNTER — Encounter (HOSPITAL_BASED_OUTPATIENT_CLINIC_OR_DEPARTMENT_OTHER): Payer: Self-pay | Admitting: Gynecologic Oncology

## 2019-06-17 DIAGNOSIS — Z7982 Long term (current) use of aspirin: Secondary | ICD-10-CM | POA: Insufficient documentation

## 2019-06-17 DIAGNOSIS — Z79899 Other long term (current) drug therapy: Secondary | ICD-10-CM | POA: Insufficient documentation

## 2019-06-17 DIAGNOSIS — G2581 Restless legs syndrome: Secondary | ICD-10-CM | POA: Diagnosis not present

## 2019-06-17 DIAGNOSIS — Z880 Allergy status to penicillin: Secondary | ICD-10-CM | POA: Diagnosis not present

## 2019-06-17 DIAGNOSIS — Z888 Allergy status to other drugs, medicaments and biological substances status: Secondary | ICD-10-CM | POA: Diagnosis not present

## 2019-06-17 DIAGNOSIS — D071 Carcinoma in situ of vulva: Secondary | ICD-10-CM | POA: Diagnosis not present

## 2019-06-17 DIAGNOSIS — E785 Hyperlipidemia, unspecified: Secondary | ICD-10-CM | POA: Insufficient documentation

## 2019-06-17 DIAGNOSIS — M797 Fibromyalgia: Secondary | ICD-10-CM | POA: Insufficient documentation

## 2019-06-17 DIAGNOSIS — F329 Major depressive disorder, single episode, unspecified: Secondary | ICD-10-CM | POA: Insufficient documentation

## 2019-06-17 DIAGNOSIS — K449 Diaphragmatic hernia without obstruction or gangrene: Secondary | ICD-10-CM | POA: Diagnosis not present

## 2019-06-17 DIAGNOSIS — Z881 Allergy status to other antibiotic agents status: Secondary | ICD-10-CM | POA: Diagnosis not present

## 2019-06-17 DIAGNOSIS — F419 Anxiety disorder, unspecified: Secondary | ICD-10-CM | POA: Insufficient documentation

## 2019-06-17 DIAGNOSIS — Z85850 Personal history of malignant neoplasm of thyroid: Secondary | ICD-10-CM | POA: Insufficient documentation

## 2019-06-17 DIAGNOSIS — N903 Dysplasia of vulva, unspecified: Secondary | ICD-10-CM

## 2019-06-17 DIAGNOSIS — K219 Gastro-esophageal reflux disease without esophagitis: Secondary | ICD-10-CM | POA: Diagnosis not present

## 2019-06-17 HISTORY — DX: Immunization not carried out due to unavailability of vaccine: Z28.83

## 2019-06-17 HISTORY — PX: VULVECTOMY: SHX1086

## 2019-06-17 LAB — POCT I-STAT, CHEM 8
BUN: 21 mg/dL (ref 8–23)
Calcium, Ion: 1.2 mmol/L (ref 1.15–1.40)
Chloride: 105 mmol/L (ref 98–111)
Creatinine, Ser: 0.9 mg/dL (ref 0.44–1.00)
Glucose, Bld: 108 mg/dL — ABNORMAL HIGH (ref 70–99)
HCT: 38 % (ref 36.0–46.0)
Hemoglobin: 12.9 g/dL (ref 12.0–15.0)
Potassium: 3.7 mmol/L (ref 3.5–5.1)
Sodium: 142 mmol/L (ref 135–145)
TCO2: 26 mmol/L (ref 22–32)

## 2019-06-17 SURGERY — WIDE EXCISION VULVECTOMY
Anesthesia: General

## 2019-06-17 MED ORDER — WHITE PETROLATUM EX OINT
TOPICAL_OINTMENT | CUTANEOUS | Status: AC
  Filled 2019-06-17: qty 5

## 2019-06-17 MED ORDER — TRAMADOL HCL 50 MG PO TABS
50.0000 mg | ORAL_TABLET | Freq: Four times a day (QID) | ORAL | Status: DC | PRN
  Filled 2019-06-17: qty 1

## 2019-06-17 MED ORDER — MIDAZOLAM HCL 2 MG/2ML IJ SOLN
INTRAMUSCULAR | Status: AC
  Filled 2019-06-17: qty 2

## 2019-06-17 MED ORDER — PROMETHAZINE HCL 25 MG/ML IJ SOLN
6.2500 mg | INTRAMUSCULAR | Status: DC | PRN
  Filled 2019-06-17: qty 1

## 2019-06-17 MED ORDER — ONDANSETRON HCL 4 MG PO TABS
4.0000 mg | ORAL_TABLET | Freq: Four times a day (QID) | ORAL | Status: DC | PRN
  Filled 2019-06-17: qty 1

## 2019-06-17 MED ORDER — OXYCODONE HCL 5 MG/5ML PO SOLN
5.0000 mg | Freq: Once | ORAL | Status: DC | PRN
  Filled 2019-06-17: qty 5

## 2019-06-17 MED ORDER — DEXAMETHASONE SODIUM PHOSPHATE 10 MG/ML IJ SOLN
INTRAMUSCULAR | Status: DC | PRN
  Administered 2019-06-17: 5 mg via INTRAVENOUS

## 2019-06-17 MED ORDER — PROPOFOL 10 MG/ML IV BOLUS
INTRAVENOUS | Status: AC
  Filled 2019-06-17: qty 20

## 2019-06-17 MED ORDER — DEXAMETHASONE SODIUM PHOSPHATE 10 MG/ML IJ SOLN
INTRAMUSCULAR | Status: AC
  Filled 2019-06-17: qty 1

## 2019-06-17 MED ORDER — BUPIVACAINE HCL 0.25 % IJ SOLN
INTRAMUSCULAR | Status: DC | PRN
  Administered 2019-06-17: 10 mL

## 2019-06-17 MED ORDER — LIDOCAINE 2% (20 MG/ML) 5 ML SYRINGE
INTRAMUSCULAR | Status: AC
  Filled 2019-06-17: qty 5

## 2019-06-17 MED ORDER — LIDOCAINE HCL 1 % IJ SOLN
INTRAMUSCULAR | Status: DC | PRN
  Administered 2019-06-17: 10 mL

## 2019-06-17 MED ORDER — LACTATED RINGERS IV SOLN
INTRAVENOUS | Status: DC
  Filled 2019-06-17: qty 1000

## 2019-06-17 MED ORDER — HYDROMORPHONE HCL 1 MG/ML IJ SOLN
0.2000 mg | INTRAMUSCULAR | Status: DC | PRN
  Filled 2019-06-17: qty 1

## 2019-06-17 MED ORDER — HYDROMORPHONE HCL 1 MG/ML IJ SOLN
0.2500 mg | INTRAMUSCULAR | Status: DC | PRN
  Filled 2019-06-17: qty 0.5

## 2019-06-17 MED ORDER — CEFAZOLIN SODIUM-DEXTROSE 2-4 GM/100ML-% IV SOLN
INTRAVENOUS | Status: AC
  Filled 2019-06-17: qty 100

## 2019-06-17 MED ORDER — MEPERIDINE HCL 25 MG/ML IJ SOLN
6.2500 mg | INTRAMUSCULAR | Status: DC | PRN
  Filled 2019-06-17: qty 1

## 2019-06-17 MED ORDER — ONDANSETRON HCL 4 MG/2ML IJ SOLN
INTRAMUSCULAR | Status: AC
  Filled 2019-06-17: qty 2

## 2019-06-17 MED ORDER — ACETIC ACID 5 % SOLN
Status: DC | PRN
  Administered 2019-06-17: 1 via TOPICAL

## 2019-06-17 MED ORDER — ONDANSETRON HCL 4 MG/2ML IJ SOLN
4.0000 mg | Freq: Four times a day (QID) | INTRAMUSCULAR | Status: DC | PRN
  Filled 2019-06-17: qty 2

## 2019-06-17 MED ORDER — MIDAZOLAM HCL 2 MG/2ML IJ SOLN
INTRAMUSCULAR | Status: DC | PRN
  Administered 2019-06-17 (×2): 25 mg via INTRAVENOUS

## 2019-06-17 MED ORDER — CEFAZOLIN SODIUM-DEXTROSE 2-4 GM/100ML-% IV SOLN
2.0000 g | INTRAVENOUS | Status: AC
  Administered 2019-06-17: 08:00:00 2 g via INTRAVENOUS
  Filled 2019-06-17: qty 100

## 2019-06-17 MED ORDER — FENTANYL CITRATE (PF) 100 MCG/2ML IJ SOLN
INTRAMUSCULAR | Status: AC
  Filled 2019-06-17: qty 2

## 2019-06-17 MED ORDER — GLYCOPYRROLATE PF 0.2 MG/ML IJ SOSY
PREFILLED_SYRINGE | INTRAMUSCULAR | Status: DC | PRN
  Administered 2019-06-17: .2 mg via INTRAVENOUS

## 2019-06-17 MED ORDER — OXYCODONE HCL 5 MG PO TABS
5.0000 mg | ORAL_TABLET | ORAL | Status: DC | PRN
  Filled 2019-06-17: qty 2

## 2019-06-17 MED ORDER — OXYCODONE HCL 5 MG PO TABS
5.0000 mg | ORAL_TABLET | Freq: Once | ORAL | Status: DC | PRN
  Filled 2019-06-17: qty 1

## 2019-06-17 MED ORDER — LIDOCAINE 2% (20 MG/ML) 5 ML SYRINGE
INTRAMUSCULAR | Status: DC | PRN
  Administered 2019-06-17: 60 mg via INTRAVENOUS

## 2019-06-17 MED ORDER — PROPOFOL 10 MG/ML IV BOLUS
INTRAVENOUS | Status: DC | PRN
  Administered 2019-06-17: 150 mg via INTRAVENOUS

## 2019-06-17 SURGICAL SUPPLY — 25 items
BLADE CLIPPER SENSICLIP SURGIC (BLADE) IMPLANT
BLADE SURG 15 STRL LF DISP TIS (BLADE) ×1 IMPLANT
BLADE SURG 15 STRL SS (BLADE) ×1
CANISTER SUCT 3000ML PPV (MISCELLANEOUS) ×2 IMPLANT
CATH ROBINSON RED A/P 14FR (CATHETERS) IMPLANT
COVER WAND RF STERILE (DRAPES) ×2 IMPLANT
GAUZE 4X4 16PLY RFD (DISPOSABLE) ×2 IMPLANT
GLOVE BIO SURGEON STRL SZ 6 (GLOVE) ×6 IMPLANT
GOWN STRL REUS W/TWL LRG LVL3 (GOWN DISPOSABLE) ×2 IMPLANT
KIT TURNOVER CYSTO (KITS) ×2 IMPLANT
NEEDLE HYPO 25X1 1.5 SAFETY (NEEDLE) ×2 IMPLANT
NS IRRIG 500ML POUR BTL (IV SOLUTION) ×2 IMPLANT
PACK PERINEAL COLD (PAD) ×2 IMPLANT
PACK VAGINAL WOMENS (CUSTOM PROCEDURE TRAY) ×2 IMPLANT
SUT VIC AB 0 SH 27 (SUTURE) ×2 IMPLANT
SUT VIC AB 2-0 CT2 27 (SUTURE) IMPLANT
SUT VIC AB 2-0 SH 27 (SUTURE)
SUT VIC AB 2-0 SH 27XBRD (SUTURE) IMPLANT
SUT VIC AB 3-0 SH 27 (SUTURE)
SUT VIC AB 3-0 SH 27X BRD (SUTURE) IMPLANT
SUT VICRYL 4-0 PS2 18IN ABS (SUTURE) ×6 IMPLANT
SWAB OB GYN 8IN STERILE 2PK (MISCELLANEOUS) ×2 IMPLANT
SYR BULB IRRIGATION 50ML (SYRINGE) ×2 IMPLANT
TOWEL OR 17X26 10 PK STRL BLUE (TOWEL DISPOSABLE) ×4 IMPLANT
WATER STERILE IRR 500ML POUR (IV SOLUTION) IMPLANT

## 2019-06-17 NOTE — Transfer of Care (Signed)
Immediate Anesthesia Transfer of Care Note  Patient: Amanda [name]  Procedure(s) Performed: Procedure(s) (LRB): WIDE LOCAL  EXCISION VULVECTOMY (N/A)  Patient Location: PACU  Anesthesia Type: General  Level of Consciousness: awake, oriented, sedated and patient cooperative  Airway & Oxygen Therapy: Patient Spontanous Breathing and Patient connected to face mask oxygen  Post-op Assessment: Report given to PACU RN and Post -op Vital signs reviewed and stable  Post vital signs: Reviewed and stable  Complications: No apparent anesthesia complications  Last Vitals:  Vitals Value Taken Time  BP    Temp    Pulse 76 06/17/19 0856  Resp 14 06/17/19 0856  SpO2 100 % 06/17/19 0856  Vitals shown include unvalidated device data.  Last Pain:  Vitals:   06/17/19 0648  TempSrc: Oral      Patients Stated Pain Goal: 6 (06/17/19 2620)

## 2019-06-17 NOTE — Discharge Instructions (Addendum)
Vulvectomy, Care After  This sheet gives you information about how to care for yourself after your procedure. Your health care provider may also give you more specific instructions. If you have problems or questions, contact your health care provider. What can I expect after the procedure? After the procedure, it is common to have:  Vaginal pain.  Vaginal numbness.  Vaginal swelling.  Bloody vaginal discharge. Follow these instructions at home: Activity   Rest as told by your health care provider.  Do not lift, push, or pull more than 5 lb (2.3 kg), or the limit that you are told, until your health care provider says that it is safe.  Avoid activities that take a lot of effort for as long as told by your health care provider. This includes any exercise.  Raise (elevate) your legs while sitting or lying down.  Avoid standing or sitting in one place for long periods of time.  Do not cross your legs, especially when sitting, until your health care provider approves.  Return to your normal activities as told by your health care provider. Ask your health care provider what activities are safe for you. Bathing   Do not take baths, swim, or use a hot tub until your health care provider approves. Ask your health care provider if you can take showers. You may only be allowed to take sponge baths.  After passing urine or a bowel movement, wipe yourself from front to back and clean your vaginal area using a spray bottle.  If told by your health care provider, take a sitz bath to help with discomfort. This is a warm water bath you take while sitting down. ? Do this 2-3 times a day, or as often as told by your health care provider. ? The water should only come up to your hips and cover your buttocks. ? You may pat the area dry with a soft, clean towel. ? If needed, you may then gently dry the area with a hair dryer on a cool setting for 5-10 minutes. An enclosed box fan may also be used  to gently dry the area. Incision care   Follow instructions from your health care provider about how to take care of your incision areas. Make sure you: ? Wash your hands with soap and water before and after you change your bandages (dressing). If soap and water are not available, use hand sanitizer. ? Change your dressing as told by your health care provider.  Leave stitches (sutures), skin glue, adhesive strips, or surgical clips in place. These skin closures may need to stay in place for 2 weeks or longer.   Check your incision areas every day for signs of infection. It may be helpful to use a handheld mirror to do this. Check for: ? Redness, swelling, or pain that has gotten worse. ? More fluid or blood. ? Warmth. ? Pus or a bad smell.  If you were sent home with a drain, take care of it as told by your health care provider. Lifestyle  Do not douche or use tampons until your health care provider approves.  Do not have sex until your health care provider approves. Tell your health care provider if you have pain or numbness when you return to sexual activity.  Wear cotton underwear and comfortable, loose-fitting clothing. Medicines  Take over-the-counter and prescription medicines only as told by your health care provider.  Ask your health care provider if the medicine prescribed to you: ? Requires you  to avoid driving or using heavy machinery. ? Can cause constipation. You may need to take these actions to prevent or treat constipation:  Take over-the-counter or prescription medicines.  Eat foods that are high in fiber, such as beans, whole grains, and fresh fruits and vegetables.  Limit foods that are high in fat and processed sugars, such as fried or sweet foods. General instructions  Do not drive until your health care provider says that it is safe.  Drink enough fluid to keep your urine pale yellow.  Keep all follow-up visits as told by your health care provider.  This is important. Contact a health care provider if:  You have any of these problems in the incision area: ? More redness, swelling, or pain around the incision. ? More fluid or blood coming from the incision. ? Pus or a bad smell coming from the incision. ? The incision feels warm to the touch. ? The incision breaks open.  You have a fever.  You have painful or bloody urination.  You feel nauseous or you vomit.  You have diarrhea.  You develop constipation.  You develop a rash.  You feel dizzy or light-headed.  You have pain that does not get better with medicine. Get help right away if you:  Faint.  Have leg or chest pain.  Have abdominal pain.  Have shortness of breath. Summary  After the procedure, it is common to have some pain, numbness, or swelling in the vaginal area. It is also common to have some bloody vaginal discharge.  Do not lift, push, or pull more than 5 lb (2.3 kg), or the limit that you are told, or engage in activities that take a lot of effort until your health care provider approves.  Follow instructions from your health care provider about how to take care of your incision.  Keep all follow-up visits as told by your health care provider. This is important. This information is not intended to replace advice given to you by your health care provider. Make sure you discuss any questions you have with your health care provider. Document Revised: 05/22/2018 Document Reviewed: 05/23/2018 Elsevier Patient Education  [place] Instructions  Activity: Get plenty of rest for the remainder of the day. A responsible individual must stay with you for 24 hours following the procedure.  For the next 24 hours, DO NOT: -Drive a car -Paediatric nurse -Drink alcoholic beverages -Take any medication unless instructed by your physician -Make any legal decisions or sign important papers.  Meals: Start with liquid foods  such as gelatin or soup. Progress to regular foods as tolerated. Avoid greasy, spicy, heavy foods. If nausea and/or vomiting occur, drink only clear liquids until the nausea and/or vomiting subsides. Call your physician if vomiting continues.  Special Instructions/Symptoms: Your throat may feel dry or sore from the anesthesia or the breathing tube placed in your throat during surgery. If this causes discomfort, gargle with warm salt water. The discomfort should disappear within 24 hours.  If you had a scopolamine patch placed behind your ear for the management of post- operative nausea and/or vomiting:  1. The medication in the patch is effective for 72 hours, after which it should be removed.  Wrap patch in a tissue and discard in the trash. Wash hands thoroughly with soap and water. 2. You may remove the patch earlier than 72 hours if you experience unpleasant side effects which may include dry mouth, dizziness or visual  disturbances. 3. Avoid touching the patch. Wash your hands with soap and water after contact with the patch.    Information for Discharge Teaching: EXPAREL (bupivacaine liposome injectable suspension)   Your surgeon or anesthesiologist gave you EXPAREL(bupivacaine) to help control your pain after surgery.   EXPAREL is a local anesthetic that provides pain relief by numbing the tissue around the surgical site.  EXPAREL is designed to release pain medication over time and can control pain for up to 72 hours.  Depending on how you respond to EXPAREL, you may require less pain medication during your recovery.  Possible side effects:  Temporary loss of sensation or ability to move in the area where bupivacaine was injected.  Nausea, vomiting, constipation  Rarely, numbness and tingling in your mouth or lips, lightheadedness, or anxiety may occur.  Call your doctor right away if you think you may be experiencing any of these sensations, or if you have other questions  regarding possible side effects.  Follow all other discharge instructions given to you by your surgeon or nurse. Eat a healthy diet and drink plenty of water or other fluids.  If you return to the hospital for any reason within 96 hours following the administration of EXPAREL, it is important for health care providers to know that you have received this anesthetic. A teal colored band has been placed on your arm with the date, time and amount of EXPAREL you have received in order to alert and inform your health care providers. Please leave this armband in place for the full 96 hours following administration, and then you may remove the band.

## 2019-06-17 NOTE — Op Note (Signed)
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   Procedure: The patient was identified in the preoperative holding area. Informed consent was signed on the chart. Patient was seen history was reviewed and exam was performed.   The patient was then taken to the operating room and placed in the supine position with SCD hose on. General anesthesia was then induced without difficulty. She was then placed in the dorsolithotomy position. The perineum was prepped with Betadine. The vagina was prepped with Betadine. The patient was then draped after the prep was dried.  Timeout was performed the patient, procedure, antibiotic, allergy, and length of procedure. 5% acetic acid solution was applied to the perineum. The vulvar tissues were inspected for areas of acetowhite changes or leukoplakia. The lesion was identified and the marking pen was used to circumscribe the area with appropriate surgical margins. The subcuticular tissues were infiltrated with 1% lidocaine. The 15 blade scalpel was used to make an incision through the skin circumferentially as marked. The skin elipse was grasped and was separated from the underlying deep dermal tissues with the bovie device. After the specimen had been completely resected, it was oriented and marked at 6 o'clock in the posterior (perianal) midline with a 0-vicryl suture. The bovie was used to  obtain hemostasis at the surgical bed. The subcutaneous tissues were irrigated and made hemostatic.   The deep dermal layer was approximated with 3-0vicryl mattress sutures to bring the skin edges into approximation and off tension. The wound was closed following langher's lines. The cutaneous layer was closed with interrupted 4-0 vicryl stitches and mattress sutures to ensure a tension free and hemostatic closure. The perineum was again irrigated. The foley was removed.  All instrument, suture, laparotomy, Ray-Tec, and needle counts were correct x2. The patient tolerated the procedure well and was taken recovery room in stable condition. This is EmEveritt Amberictating an operative note on MaItzia Cunliffe EmThereasa SoloMD

## 2019-06-17 NOTE — Anesthesia Procedure Notes (Signed)
Procedure Name: LMA Insertion Date/Time: 06/17/2019 8:11 AM Performed by: Suan Halter, CRNA Pre-anesthesia Checklist: Patient identified, Emergency Drugs available, Suction available and Patient being monitored Patient Re-evaluated:Patient Re-evaluated prior to induction Oxygen Delivery Method: Circle system utilized Preoxygenation: Pre-oxygenation with 100% oxygen Induction Type: IV induction Ventilation: Mask ventilation without difficulty LMA: LMA inserted LMA Size: 4.0 Number of attempts: 1 Airway Equipment and Method: Bite block Placement Confirmation: positive ETCO2 Tube secured with: Tape Dental Injury: Teeth and Oropharynx as per pre-operative assessment

## 2019-06-17 NOTE — Interval H&P Note (Signed)
History and Physical Interval Note:  06/17/2019 7:01 AM  Amanda [name]  has presented today for surgery, with the diagnosis of VULVAR DYSPLAISA.  The various methods of treatment have been discussed with the patient and family. After consideration of risks, benefits and other options for treatment, the patient has consented to  Procedure(s): WIDE LOCAL  EXCISION VULVECTOMY (N/A) as a surgical intervention.  The patient's history has been reviewed, patient examined, no change in status, stable for surgery.  I have reviewed the patient's chart and labs.  Questions were answered to the patient's satisfaction.     Thereasa Solo

## 2019-06-17 NOTE — Anesthesia Preprocedure Evaluation (Signed)
Anesthesia Evaluation  Patient identified by MRN, date of birth, ID band Patient awake    Reviewed: Allergy & Precautions, NPO status , Patient's Chart, lab work & pertinent test results, reviewed documented beta blocker date and time   Airway Mallampati: II  TM Distance: >3 FB Neck ROM: Full    Dental no notable dental hx.    Pulmonary neg pulmonary ROS,    Pulmonary exam normal breath sounds clear to auscultation       Cardiovascular Normal cardiovascular exam Rhythm:Regular Rate:Normal  HLD  TTE 2014 Normal LVEF, valves ok  Stress Test 2012 normal   Neuro/Psych PSYCHIATRIC DISORDERS Anxiety Depression negative neurological ROS     GI/Hepatic Neg liver ROS, hiatal hernia, GERD  Medicated and Controlled,  Endo/Other  negative endocrine ROS  Renal/GU negative Renal ROS  negative genitourinary   Musculoskeletal  (+) Arthritis , Fibromyalgia -  Abdominal   Peds  Hematology negative hematology ROS (+)   Anesthesia Other Findings   Reproductive/Obstetrics                             Anesthesia Physical  Anesthesia Plan  ASA: II  Anesthesia Plan: General   Post-op Pain Management:    Induction: Intravenous  PONV Risk Score and Plan: 3 and Ondansetron, Dexamethasone and Midazolam  Airway Management Planned: LMA  Additional Equipment:   Intra-op Plan:   Post-operative Plan: Extubation in OR  Informed Consent: I have reviewed the patients History and Physical, chart, labs and discussed the procedure including the risks, benefits and alternatives for the proposed anesthesia with the patient or authorized representative who has indicated his/her understanding and acceptance.     Dental advisory given  Plan Discussed with: CRNA  Anesthesia Plan Comments:         Anesthesia Quick Evaluation

## 2019-06-17 NOTE — Anesthesia Postprocedure Evaluation (Signed)
Anesthesia Post Note  Patient: [name]  Procedure(s) Performed: WIDE LOCAL  EXCISION VULVECTOMY (N/A )     Patient location during evaluation: PACU Anesthesia Type: General Level of consciousness: awake and alert Pain management: pain level controlled Vital Signs Assessment: post-procedure vital signs reviewed and stable Respiratory status: spontaneous breathing, nonlabored ventilation and respiratory function stable Cardiovascular status: blood pressure returned to baseline and stable Postop Assessment: no apparent nausea or vomiting Anesthetic complications: no    Last Vitals:  Vitals:   06/17/19 0930 06/17/19 0945  BP: 138/82 140/72  Pulse: 68 80  Resp: 15 17  Temp:    SpO2: 100% 96%    Last Pain:  Vitals:   06/17/19 1015  TempSrc:   PainSc: 0-No pain                 [name]

## 2019-06-18 ENCOUNTER — Telehealth: Payer: Self-pay

## 2019-06-18 ENCOUNTER — Other Ambulatory Visit: Payer: Self-pay

## 2019-06-18 LAB — SURGICAL PATHOLOGY

## 2019-06-18 MED ORDER — ATORVASTATIN CALCIUM 20 MG PO TABS: 20.0000 mg | ORAL_TABLET | Freq: Every day | ORAL | 3 refills | Status: DC

## 2019-06-18 NOTE — Telephone Encounter (Signed)
Amanda [name] stated that she is doing very well today. She is eating, drinking, and urinating well. Afebrile. She is not experiencing any discomfort at this time. Using Ibuprofen only at this time as well as icing the incision. Pt began senokot-S today bid. She will add Miralax tomorrow if no BM by tomorrow morning.  Pt normally has issues with constipation. Not passing gas at this time. Amanda Qualls aware of post op appointment and office number [phone] if she has any questions or concerns.

## 2019-06-18 NOTE — Telephone Encounter (Signed)
LM for [name] to call back to the office to see how she is doing today after her surgery yesterday 06-17-19.

## 2019-06-20 ENCOUNTER — Telehealth: Payer: Self-pay | Admitting: *Deleted

## 2019-06-20 NOTE — Telephone Encounter (Signed)
Pt notified that final pathology report show no cancer.

## 2019-06-21 ENCOUNTER — Ambulatory Visit: Payer: Medicare Other | Admitting: Family Medicine

## 2019-06-23 ENCOUNTER — Encounter (HOSPITAL_BASED_OUTPATIENT_CLINIC_OR_DEPARTMENT_OTHER): Payer: Self-pay | Admitting: Emergency Medicine

## 2019-06-23 ENCOUNTER — Emergency Department (HOSPITAL_BASED_OUTPATIENT_CLINIC_OR_DEPARTMENT_OTHER): Payer: Medicare Other

## 2019-06-23 ENCOUNTER — Emergency Department (HOSPITAL_BASED_OUTPATIENT_CLINIC_OR_DEPARTMENT_OTHER)
Admission: EM | Admit: 2019-06-23 | Discharge: 2019-06-23 | Disposition: A | Discharge status: Home / Self Care | Payer: Medicare Other | Attending: Emergency Medicine | Admitting: Emergency Medicine

## 2019-06-23 ENCOUNTER — Other Ambulatory Visit: Payer: Self-pay

## 2019-06-23 DIAGNOSIS — R531 Weakness: Secondary | ICD-10-CM | POA: Insufficient documentation

## 2019-06-23 DIAGNOSIS — Z881 Allergy status to other antibiotic agents status: Secondary | ICD-10-CM | POA: Diagnosis not present

## 2019-06-23 DIAGNOSIS — R4182 Altered mental status, unspecified: Secondary | ICD-10-CM | POA: Diagnosis present

## 2019-06-23 DIAGNOSIS — Z7982 Long term (current) use of aspirin: Secondary | ICD-10-CM | POA: Diagnosis not present

## 2019-06-23 DIAGNOSIS — E785 Hyperlipidemia, unspecified: Secondary | ICD-10-CM | POA: Insufficient documentation

## 2019-06-23 DIAGNOSIS — Z880 Allergy status to penicillin: Secondary | ICD-10-CM | POA: Insufficient documentation

## 2019-06-23 DIAGNOSIS — Z79899 Other long term (current) drug therapy: Secondary | ICD-10-CM | POA: Diagnosis not present

## 2019-06-23 DIAGNOSIS — E860 Dehydration: Secondary | ICD-10-CM | POA: Insufficient documentation

## 2019-06-23 DIAGNOSIS — M797 Fibromyalgia: Secondary | ICD-10-CM | POA: Insufficient documentation

## 2019-06-23 DIAGNOSIS — Z887 Allergy status to serum and vaccine status: Secondary | ICD-10-CM | POA: Insufficient documentation

## 2019-06-23 DIAGNOSIS — Z888 Allergy status to other drugs, medicaments and biological substances status: Secondary | ICD-10-CM | POA: Diagnosis not present

## 2019-06-23 LAB — CBC WITH DIFFERENTIAL/PLATELET
Abs Immature Granulocytes: 0.02 10*3/uL (ref 0.00–0.07)
Basophils Absolute: 0 10*3/uL (ref 0.0–0.1)
Basophils Relative: 1 %
Eosinophils Absolute: 0.2 10*3/uL (ref 0.0–0.5)
Eosinophils Relative: 3 %
HCT: 39.7 % (ref 36.0–46.0)
Hemoglobin: 13.2 g/dL (ref 12.0–15.0)
Immature Granulocytes: 0 %
Lymphocytes Relative: 19 %
Lymphs Abs: 1.2 10*3/uL (ref 0.7–4.0)
MCH: 30.8 pg (ref 26.0–34.0)
MCHC: 33.2 g/dL (ref 30.0–36.0)
MCV: 92.8 fL (ref 80.0–100.0)
Monocytes Absolute: 0.4 10*3/uL (ref 0.1–1.0)
Monocytes Relative: 6 %
Neutro Abs: 4.7 10*3/uL (ref 1.7–7.7)
Neutrophils Relative %: 71 %
Platelets: 224 10*3/uL (ref 150–400)
RBC: 4.28 MIL/uL (ref 3.87–5.11)
RDW: 12.2 % (ref 11.5–15.5)
WBC: 6.6 10*3/uL (ref 4.0–10.5)
nRBC: 0 % (ref 0.0–0.2)

## 2019-06-23 LAB — COMPREHENSIVE METABOLIC PANEL
ALT: 19 U/L (ref 0–44)
AST: 26 U/L (ref 15–41)
Albumin: 4.8 g/dL (ref 3.5–5.0)
Alkaline Phosphatase: 79 U/L (ref 38–126)
Anion gap: 11 (ref 5–15)
BUN: 18 mg/dL (ref 8–23)
CO2: 23 mmol/L (ref 22–32)
Calcium: 9.4 mg/dL (ref 8.9–10.3)
Chloride: 99 mmol/L (ref 98–111)
Creatinine, Ser: 0.72 mg/dL (ref 0.44–1.00)
GFR calc Af Amer: >60 mL/min (ref >60)
GFR calc non Af Amer: >60 mL/min (ref >60)
Glucose, Bld: 101 mg/dL — ABNORMAL HIGH (ref 70–99)
Potassium: 3.4 mmol/L — ABNORMAL LOW (ref 3.5–5.1)
Sodium: 133 mmol/L — ABNORMAL LOW (ref 135–145)
Total Bilirubin: 0.6 mg/dL (ref 0.3–1.2)
Total Protein: 8.3 g/dL — ABNORMAL HIGH (ref 6.5–8.1)

## 2019-06-23 LAB — ETHANOL: Alcohol, Ethyl (B): <10 mg/dL (ref <10)

## 2019-06-23 LAB — POCT I-STAT EG7
Acid-Base Excess: 1 mmol/L (ref 0.0–2.0)
Bicarbonate: 23.4 mmol/L (ref 20.0–28.0)
Calcium, Ion: 1.14 mmol/L — ABNORMAL LOW (ref 1.15–1.40)
HCT: 36 % (ref 36.0–46.0)
Hemoglobin: 12.2 g/dL (ref 12.0–15.0)
O2 Saturation: 74 %
Potassium: 3.3 mmol/L — ABNORMAL LOW (ref 3.5–5.1)
Sodium: 134 mmol/L — ABNORMAL LOW (ref 135–145)
TCO2: 24 mmol/L (ref 22–32)
pCO2, Ven: 30.5 mmHg — ABNORMAL LOW (ref 44.0–60.0)
pH, Ven: 7.492 — ABNORMAL HIGH (ref 7.250–7.430)
pO2, Ven: 35 mmHg (ref 32.0–45.0)

## 2019-06-23 LAB — MAGNESIUM: Magnesium: 2.2 mg/dL (ref 1.7–2.4)

## 2019-06-23 LAB — AMMONIA: Ammonia: 12 umol/L (ref 9–35)

## 2019-06-23 LAB — CBG MONITORING, ED: Glucose-Capillary: 104 mg/dL — ABNORMAL HIGH (ref 70–99)

## 2019-06-23 MED ORDER — PROCHLORPERAZINE EDISYLATE 10 MG/2ML IJ SOLN: 10.0000 mg | Freq: Once | INTRAMUSCULAR | Status: DC

## 2019-06-23 MED ORDER — SODIUM CHLORIDE 0.9 % IV BOLUS
1000.0000 mL | Freq: Once | INTRAVENOUS | Status: AC
  Administered 2019-06-23: 1000 mL via INTRAVENOUS

## 2019-06-23 MED ORDER — DIPHENHYDRAMINE HCL 50 MG/ML IJ SOLN: 25.0000 mg | Freq: Once | INTRAMUSCULAR | Status: DC

## 2019-06-23 NOTE — ED Provider Notes (Signed)
[facility] EMERGENCY DEPARTMENT Provider Note   CSN: [id number] Arrival date & time: 06/23/19  1536     History Chief Complaint  Patient presents with  . Altered Mental Status    Amanda [name] is a 69 y.o. female.  69 yo F with a chief complaints of altered mental status.  Per the husband the patient had a energy drink called Celsius after which she had some progressive weakness.  Generalized.  They went for a walk in the park and then on the way back to the car he felt he got much worse.  Denied any laterality.  Patient denies headache denies neck pain denies chest pain denies shortness of breath denies abdominal pain.  States that she feels generally weak and cold.  No new medications were started.  She had surgery about a week ago for a skin lesion on her left vulvar area.  No issues with that.  Has been doing well.  No redness or drainage per the family.  No fevers.  Eating and drinking normally.  Patient states she thinks she feels dehydrated.  Husband states that she has had an episode where she has been dehydrated and had to come to the hospital for IV fluids in the past and he thinks maybe that is what it is as well.  She was able to drink a couple bottles of Gatorade before she arrived.  Husband states that she has photophobia and feels cold essentially all the time and that is her baseline.  The history is provided by the patient.  Altered Mental Status Associated symptoms: weakness   Associated symptoms: no fever, no headaches, no nausea, no palpitations and no vomiting   Illness Severity:  Moderate Onset quality:  Gradual Duration:  4 hours Timing:  Constant Progression:  Worsening Associated symptoms: no chest pain, no congestion, no fever, no headaches, no myalgias, no nausea, no rhinorrhea, no shortness of breath, no vomiting and no wheezing        Past Medical History:  Diagnosis Date  . Anxiety   . Celiac disease   . Chronic constipation   .  COVID-19 virus vaccine not available   . GERD (gastroesophageal reflux disease)    occasionally (per pt takes apple cider vinager)  . Heart palpitations    cardiologist-- dr Ellyn Hack--- event monitor 08-24-2011 epic ;  nuclear stress test 03-16-2011 (epic) normal w/ no ischemia, ef 65%;  echo 01-18-2013  ef 60-65%, G1DD  . Hiatal hernia   . History of subdural hematoma    12/ 23/ 2010  s/p  craniotomy w/ hematoma evacuation (pt had a fall w/ concussion)  per pt no residual  . History of thyroid cancer followed by dr Constance Holster---   s/p  left thyroidectomy --- per pt no radiation and no recurrence  . Hyperlipidemia   . Immunization, single disease 06/09/2019   1st dose moderma vaccine administered  . Insomnia   . OA (osteoarthritis)   . Osteopenia, T score -2.1 FRAX 9.4%/1.3% stable from prior DEXA 06/2015  . Restless leg syndrome   . Right thyroid nodule    followed by dr Constance Holster--- last ultrasound in epic 03-30-2018 stable , to bx  . STD (sexually transmitted disease), HSV   . 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 Wears glasses     Patient Active Problem List   Diagnosis Date Noted  . 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 History of subdural hematoma 03/26/2019  . Herpes  simplex type 1 infection 11/01/2018  . History of Guillain-Barre syndrome 04/01/2018  . Hyperlipidemia, unspecified 03/30/2018  . Mastalgia, left, followed by GYN 03/29/2018  . Fibromyalgia 03/29/2018  . Fuchs' corneal dystrophy, 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  . Insulin resistance 03/29/2018  . History of hysterectomy 03/29/2018  . Grade I diastolic dysfunction 02/72/5366  . Thyroid nodule 03/27/2018  . Presbycusis of both ears 03/10/2017  . Dysthymia 09/26/2016  . Anxiety, prn Klonopin 08/03/2016  . Palpitations 01/17/2013  . GERD (gastroesophageal reflux disease)   . Restless leg syndrome, on Requip   . Allergic rhinitis 06/25/2009  . Diaphragmatic hernia 02/16/2009    . IBS (irritable bowel syndrome) 02/16/2009  . Celiac disease 11/27/2007  . Sleep disorder 07/05/2007  . Osteoarthritis 07/05/2007  . Osteopenia 10/03/2006  . Left carotid bruit 10/03/2006  . History of thyroid cancer 10/03/2006    Past Surgical History:  Procedure Laterality Date  . ABDOMINAL HYSTERECTOMY  1986   ovaries remain  . BREAST REDUCTION SURGERY Bilateral 1999  . CATARACT EXTRACTION W/ INTRAOCULAR LENS  IMPLANT, BILATERAL  2019  . COLONOSCOPY  last one 11-22-2018  . ESOPHAGOGASTRODUODENOSCOPY    . KNEE ARTHROSCOPY Bilateral 2002  . SUBDURAL HEMATOMA EVACUATION VIA CRANIOTOMY  04-30-2009  @[facility]    left frontotemporapartietal   . THYROID LOBECTOMY Left 05-04-2001  @[facility]   dr Constance Holster  . VULVECTOMY N/A 03/12/2019   Procedure: WIDE LOCAL EXCISION OF CARCINOMA IS SITU OF VULVA;  Surgeon: Anastasio Auerbach, MD;  Location: [facility];  Service: Gynecology;  Laterality: N/A;  request to follow in [place] block time requests one hour colposcope available in OR with acetic acid.  [name] N/A 06/17/2019   Procedure: WIDE LOCAL  EXCISION VULVECTOMY;  Surgeon: Everitt Amber, MD;  Location: [facility];  Service: Gynecology;  Laterality: N/A;     OB History    Gravida  2   Para  2   Term  2   Preterm      AB      Living  2     SAB      TAB      Ectopic      Multiple      Live Births              Family History  Problem Relation Age of Onset  . Diabetes Maternal Aunt   . Breast cancer Maternal Aunt 60  . Liver cancer Father        alcohol related likely  . Failure to thrive Mother        3  . Heart disease Maternal Grandfather   . Hyperlipidemia Maternal Grandfather   . Hypertension Paternal Grandfather   . Colon cancer Neg Hx     Social History   Tobacco Use  . Smoking status: Never Smoker  . Smokeless tobacco: Never Used  Substance Use Topics  . Alcohol use: Yes    Alcohol/week: 7.0 standard drinks     Types: 7 Cans of beer per week    Comment: 1 beer nightly  . Drug use: Never    Home Medications Prior to Admission medications   Medication Sig Start Date End Date Taking? Authorizing Provider  APPLE CIDER VINEGAR PO Take 1 capsule by mouth 2 (two) times daily. Reported on 06/09/2015    [provider]  aspirin EC 81 MG tablet Take 81 mg by mouth daily.    [provider]  atorvastatin (LIPITOR) 20 MG tablet  Take 1 tablet (20 mg total) by mouth at bedtime. 06/18/19   Marin Olp, MD  bisacodyl (DULCOLAX) 5 MG EC tablet Take 5 mg by mouth daily as needed for moderate constipation.    [provider]  Black Currant Seed Oil 500 MG CAPS Take by mouth daily.    [provider]  CALCIUM-VITAMIN D PO Take 1 tablet by mouth daily.     [provider]  cholecalciferol (VITAMIN D3) 25 MCG (1000 UT) tablet Take 1,000 Units by mouth daily. Take 2 in the am takes with magnesium    [provider]  clonazePAM (KLONOPIN) 1 MG tablet Take 1 tablet (1 mg total) by mouth at bedtime as needed (sleep). for anxiety 03/26/19   Marin Olp, MD  CO ENZYME Q-10 PO Take by mouth daily.     [provider]  Cyanocobalamin (VITAMIN B-12 ER PO) Take 5,000 mcg by mouth daily.    [provider]  diclofenac sodium (VOLTAREN) 1 % GEL APPLY TOPICALLY TO AFFECTED AREA 4 TIMES A DAY 05/28/18   Gerda Diss, DO  ibuprofen (ADVIL) 600 MG tablet Take 1 tablet (600 mg total) by mouth every 6 (six) hours as needed for moderate pain. For AFTER surgery 05/28/19   Joylene John D, NP  Melatonin 3 MG CAPS Take by mouth at bedtime as needed.     [provider]  metoprolol succinate (TOPROL-XL) 25 MG 24 hr tablet Take 1 tablet (25 mg total) by mouth daily. Patient taking differently: Take 25 mg by mouth at bedtime.  02/27/19   Lendon Colonel, NP  Multiple Vitamin (MULTIVITAMIN) tablet Take 1 tablet by mouth daily.      [provider]  NON FORMULARY Bausch and lomb ointment-Muro-//128, 5%ointment- Each eye nightly    [provider]  oxyCODONE (OXY IR/ROXICODONE) 5 MG immediate release tablet Take 1 tablet (5 mg total) by mouth every 4 (four) hours as needed for severe pain. For AFTER surgery only, do not take and drive 0/73/71   Cross, Lenna Sciara D, NP  polyethylene glycol (MIRALAX / GLYCOLAX) packet Take 17 g by mouth daily as needed.     [provider]  Propylene Glycol (SYSTANE BALANCE) 0.6 % SOLN Apply 1 drop to eye 2 (two) times daily. For dry eyes    [provider]  rOPINIRole (REQUIP) 0.25 MG tablet Take 1 tablet (0.25 mg total) by mouth at bedtime. 03/26/19   Marin Olp, MD  senna-docusate (SENOKOT-S) 8.6-50 MG tablet Take 2 tablets by mouth at bedtime. For AFTER surgery, do not take if having diarrhea 05/28/19   Joylene John D, NP  valACYclovir (VALTREX) 500 MG tablet One tab bid as needed for outbreak 03/26/19   Marin Olp, MD    Allergies    Ambien [zolpidem tartrate], Cortisone, Eszopiclone and related, Levofloxacin, Dicloxacillin, Dilantin [phenytoin sodium extended], Erythromycin, Avelox [moxifloxacin hcl in nacl], Influenza vaccines, Other, Nitrofuran derivatives, Phenytoin sodium extended, and Trazodone and nefazodone  Review of Systems   Review of Systems  Constitutional: Positive for chills (feels cold). Negative for fever.  HENT: Negative for congestion and rhinorrhea.   Eyes: Negative for redness and visual disturbance.  Respiratory: Negative for shortness of breath and wheezing.   Cardiovascular: Negative for chest pain and palpitations.  Gastrointestinal: Negative for nausea and vomiting.  Genitourinary: Negative for dysuria and urgency.  Musculoskeletal: Negative for arthralgias and myalgias.  Skin: Negative for pallor and wound.  Neurological: Positive for  weakness. Negative for dizziness and headaches.    Physical Exam Updated Vital  Signs BP (!) 141/77   Pulse 62   Temp 99 F (37.2 C) (Oral)   Resp 16   Ht 5' 9"  (1.753 m)   Wt 76 kg   LMP  (LMP Unknown)   SpO2 100%   BMI 24.74 kg/m   Physical Exam Vitals and nursing note reviewed.  Constitutional:      General: She is not in acute distress.    Appearance: She is well-developed. She is not diaphoretic.  HENT:     Head: Normocephalic and atraumatic.  Eyes:     Pupils: Pupils are equal, round, and reactive to light.     Comments: Photophobia  Cardiovascular:     Rate and Rhythm: Normal rate and regular rhythm.     Heart sounds: No murmur. No friction rub. No gallop.   Pulmonary:     Effort: Pulmonary effort is normal.     Breath sounds: No wheezing or rales.  Abdominal:     General: There is no distension.     Palpations: Abdomen is soft.     Tenderness: There is no abdominal tenderness.  Musculoskeletal:        General: No tenderness.     Cervical back: Normal range of motion and neck supple.  Skin:    General: Skin is warm and dry.  Neurological:     Mental Status: She is alert and oriented to person, place, and time.     Comments: Slow to respond to all commands.  Diffusely diminished effort.  Psychiatric:        Behavior: Behavior normal.     ED Results / Procedures / Treatments   Labs (all labs ordered are listed, but only abnormal results are displayed) Labs Reviewed  COMPREHENSIVE METABOLIC PANEL - Abnormal; Notable for the following components:      Result Value   Sodium 133 (*)    Potassium 3.4 (*)    Glucose, Bld 101 (*)    Total Protein 8.3 (*)    All other components within normal limits  CBG MONITORING, ED - Abnormal; Notable for the following components:   Glucose-Capillary 104 (*)    All other components within normal limits  POCT I-STAT EG7 - Abnormal; Notable for the following components:   pH, Ven 7.492 (*)    pCO2, Ven 30.5 (*)    Sodium 134 (*)    Potassium 3.3 (*)    Calcium, Ion 1.14 (*)    All other components  within normal limits  URINE CULTURE  CBC WITH DIFFERENTIAL/PLATELET  AMMONIA  ETHANOL  MAGNESIUM  BLOOD GAS, VENOUS  URINALYSIS, ROUTINE W REFLEX MICROSCOPIC  RAPID URINE DRUG SCREEN, HOSP PERFORMED  CBG MONITORING, ED    EKG EKG Interpretation  Date/Time:  Sunday June 23 2019 15:50:43 EST Ventricular Rate:  81 PR Interval:    QRS Duration: 114 QT Interval:  373 QTC Calculation: 433 R Axis:   38 Text Interpretation: Sinus rhythm Borderline intraventricular conduction delay Low voltage, precordial leads Borderline T abnormalities, anterior leads Baseline wander in lead(s) V2 No significant change since last tracing Confirmed by Deno Etienne [phone]) on 06/23/2019 5:05:46 PM   Radiology No results found.  Procedures Procedures (including critical care time)  Medications Ordered in ED Medications  prochlorperazine (COMPAZINE) injection 10 mg (10 mg Intravenous Not Given 06/23/19 1714)  diphenhydrAMINE (BENADRYL) injection 25 mg (25 mg Intravenous Not Given 06/23/19 1714)  sodium chloride 0.9 %  bolus 1,000 mL (1,000 mLs Intravenous New Bag/Given 06/23/19 1607)    ED Course  I have reviewed the triage vital signs and the nursing notes.  Pertinent labs & imaging results that were available during my care of the patient were reviewed by me and considered in my medical decision making (see chart for details).    MDM Rules/Calculators/A&P                      69 yo F with a chief complaint of generalized fatigue.  This started about lunchtime today after she had a new energy drink.  Husband thinks that she is dehydrated and she does as well.  We will perform an altered mental status work-up.  Give a bolus of IV fluids.  Offered a headache cocktail which she declined.  Reassess.  Patient is much more alert on reassessment states that she is back to normal.  Patient declining imaging.  States that she has had a similar presentation that was consistent with dehydration.  She would  like to go home at this time.  PCP follow-up.  5:41 PM:  I have discussed the diagnosis/risks/treatment options with the patient and family and believe the pt to be eligible for discharge home to follow-up with PCP. We also discussed returning to the ED immediately if new or worsening sx occur. We discussed the sx which are most concerning (e.g., sudden worsening pain, fever, inability to tolerate by mouth) that necessitate immediate return. Medications administered to the patient during their visit and any new prescriptions provided to the patient are listed below.  Medications given during this visit Medications  prochlorperazine (COMPAZINE) injection 10 mg (10 mg Intravenous Not Given 06/23/19 1714)  diphenhydrAMINE (BENADRYL) injection 25 mg (25 mg Intravenous Not Given 06/23/19 1714)  sodium chloride 0.9 % bolus 1,000 mL (1,000 mLs Intravenous New Bag/Given 06/23/19 1607)     The patient appears reasonably screen and/or stabilized for discharge and I doubt any other medical condition or other [facility] requiring further screening, evaluation, or treatment in the ED at this time prior to discharge.   Final Clinical Impression(s) / ED Diagnoses Final diagnoses:  Dehydration    Rx / DC Orders ED Discharge Orders    None       Deno Etienne, DO 06/23/19 1741

## 2019-06-23 NOTE — Discharge Instructions (Signed)
And drink well for the next 48 hours.  Please return for worsening symptoms confusion headache chest pain shortness of breath.

## 2019-06-23 NOTE — ED Notes (Signed)
Pt is able to tell this RN name and dob when asked.

## 2019-06-23 NOTE — ED Triage Notes (Signed)
Pts husband reports energy drink around 1pm today. Then reports went for a walk and approx 30 min after drinking became altered. Pt presents lethargic. Husband states pt also used fleet enema. Reports hx of dehydration with similar symptoms

## 2019-06-23 NOTE — ED Notes (Signed)
Pt answering EDP questions appropriately

## 2019-06-23 NOTE — ED Notes (Signed)
ED Provider at bedside. 

## 2019-06-23 NOTE — ED Notes (Signed)
Patient asked to postpone imaging, would prefer to see if IV fluids help 1st

## 2019-06-28 ENCOUNTER — Ambulatory Visit: Payer: Medicare Other

## 2019-07-07 DIAGNOSIS — Z23 Encounter for immunization: Secondary | ICD-10-CM | POA: Diagnosis not present

## 2019-07-09 ENCOUNTER — Inpatient Hospital Stay: Payer: Medicare Other | Admitting: Gynecologic Oncology

## 2019-07-09 ENCOUNTER — Telehealth: Payer: Self-pay | Admitting: *Deleted

## 2019-07-09 NOTE — Telephone Encounter (Signed)
Patient's husband called and canceled her appt for today. Patient isn't feeling well, had second COVID injection on  Sunday. Patient is doing fine, no post op issuses

## 2019-07-10 ENCOUNTER — Telehealth: Payer: Self-pay | Admitting: *Deleted

## 2019-07-10 NOTE — Telephone Encounter (Signed)
Return pt call to reschedule her appointment from yesterday.

## 2019-07-11 ENCOUNTER — Encounter: Payer: Self-pay | Admitting: Gynecologic Oncology

## 2019-07-11 ENCOUNTER — Inpatient Hospital Stay: Payer: Medicare Other | Attending: Gynecologic Oncology | Admitting: Gynecologic Oncology

## 2019-07-11 ENCOUNTER — Other Ambulatory Visit: Payer: Self-pay

## 2019-07-11 VITALS — BP 133/75 | HR 75 | Temp 97.8°F | Ht 62.5 in | Wt 159.2 lb

## 2019-07-11 DIAGNOSIS — A630 Anogenital (venereal) warts: Secondary | ICD-10-CM | POA: Insufficient documentation

## 2019-07-11 DIAGNOSIS — Z791 Long term (current) use of non-steroidal anti-inflammatories (NSAID): Secondary | ICD-10-CM | POA: Diagnosis not present

## 2019-07-11 DIAGNOSIS — F419 Anxiety disorder, unspecified: Secondary | ICD-10-CM | POA: Diagnosis not present

## 2019-07-11 DIAGNOSIS — M8580 Other specified disorders of bone density and structure, unspecified site: Secondary | ICD-10-CM | POA: Insufficient documentation

## 2019-07-11 DIAGNOSIS — D071 Carcinoma in situ of vulva: Secondary | ICD-10-CM | POA: Insufficient documentation

## 2019-07-11 DIAGNOSIS — K219 Gastro-esophageal reflux disease without esophagitis: Secondary | ICD-10-CM | POA: Insufficient documentation

## 2019-07-11 DIAGNOSIS — Z90710 Acquired absence of both cervix and uterus: Secondary | ICD-10-CM | POA: Insufficient documentation

## 2019-07-11 DIAGNOSIS — M1990 Unspecified osteoarthritis, unspecified site: Secondary | ICD-10-CM | POA: Diagnosis not present

## 2019-07-11 DIAGNOSIS — E785 Hyperlipidemia, unspecified: Secondary | ICD-10-CM | POA: Diagnosis not present

## 2019-07-11 DIAGNOSIS — Z9079 Acquired absence of other genital organ(s): Secondary | ICD-10-CM

## 2019-07-11 DIAGNOSIS — Z7982 Long term (current) use of aspirin: Secondary | ICD-10-CM | POA: Diagnosis not present

## 2019-07-11 DIAGNOSIS — Z79899 Other long term (current) drug therapy: Secondary | ICD-10-CM | POA: Diagnosis not present

## 2019-07-11 NOTE — Patient Instructions (Signed)
Please contact Dr Serita Grit office (at [phone]) in April to request an appointment with her for September, 2021.  You can gradually resume normal activity as tolerated.

## 2019-07-11 NOTE — Progress Notes (Signed)
Follow-up Note: Gyn-Onc  Consult was requested by Dr. Dellis Filbert for Amanda Amanda [name] evaluation of Amanda Amanda [name] Amanda [name] 69 y.o. female  CC:  Chief Complaint  Amanda [name] presents with  . VIN III (vulvar intraepithelial neoplasia III)    Follow up    Assessment/Plan:  Amanda Amanda [name] Amanda [name]  is a 69 y.o.  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   She is doing well postop we discussed Amanda Amanda [name] increased risk for recurrence based on her 2 prior episodes of VIN 3.  I will see her back in 6 months time for vulvar inspection.  HPI: Amanda Amanda [name] Amanda [name] is a 69 year old P2 who was seen in consultation at Amanda Amanda [name] request of Dr Dellis Filbert for evaluation of VIN 3.   Amanda Amanda [name] Amanda [name] reported a history of VIN 3 in November 2020.  This was removed by Dr. Phineas Real with a wide local excision on March 12, 2019.  Pathology from that excision revealed VIN 3 with positive margins.  Amanda Amanda [name] Amanda [name] was monitored in Amanda Amanda [name] postoperative period and developed symptomatic irritation and a raised area on Amanda Amanda [name] posterior left vulvar at Amanda Amanda [name] site of Amanda Amanda [name] prior excision.  This was biopsied on May 14, 2018 and revealed VIN 2-3.  Amanda Amanda [name] Amanda Amanda [name] Amanda [name] medical history is remarkable for subdural hematoma in 2010.  She was recommended to take baby aspirin off that occurrence.  She has had a prior abdominal hysterectomy for fibroids.  She has known HPV related disease.  She is a non-smoker.  She has no immunosuppressive illnesses and takes no immunosuppressive medications.  Interval Hx:  On June 17, 2019 she underwent wide local excision with partial simple left posterior vulvectomy.  Intraoperative findings were significant for left posterior vulvar leukoplakia and acetowhite changes measuring approximately 2 cm in crossing Amanda Amanda [name] midline at Amanda Amanda [name] posterior fourchette.  Surgery was uncomplicated.  Final pathology revealed VIN 2-3 with dysplasia involving Amanda Amanda [name] 10 through 3:00 margins towards Amanda Amanda [name] medial side  Since surgery she is  done well with no complications or concerns.  Current Meds:  Outpatient Encounter Medications as of 07/11/2019  Medication Sig  . APPLE CIDER VINEGAR PO Take 1 capsule by mouth 2 (two) times daily. Reported on 06/09/2015  . aspirin EC 81 MG tablet Take 81 mg by mouth daily.  Amanda Amanda [name] Amanda [name] Kitchen atorvastatin (LIPITOR) 20 MG tablet Take 1 tablet (20 mg total) by mouth at bedtime.  . bisacodyl (DULCOLAX) 5 MG EC tablet Take 5 mg by mouth daily as needed for moderate constipation.  . Black Currant Seed Oil 500 MG CAPS Take by mouth daily.  Amanda Amanda [name] Amanda [name] Kitchen CALCIUM-VITAMIN D PO Take 1 tablet by mouth daily.   . cholecalciferol (VITAMIN D3) 25 MCG (1000 UT) tablet Take 1,000 Units by mouth daily. Take 2 in Amanda Amanda [name] am takes with magnesium  . clonazePAM (KLONOPIN) 1 MG tablet Take 1 tablet (1 mg total) by mouth at bedtime as needed (sleep). for anxiety  . CO ENZYME Q-10 PO Take by mouth daily.   . Cyanocobalamin (VITAMIN B-12 ER PO) Take 5,000 mcg by mouth daily.  . diclofenac sodium (VOLTAREN) 1 % GEL APPLY TOPICALLY TO AFFECTED AREA 4 TIMES A DAY  . ibuprofen (ADVIL) 600 MG tablet Take 1 tablet (600 mg total) by mouth every 6 (six) hours as needed for moderate pain. For AFTER surgery  . Melatonin 3 MG CAPS Take by mouth at bedtime as needed.   . metoprolol succinate (TOPROL-XL) 25 MG 24 hr tablet Take 1 tablet (25 mg total)  by mouth daily. (Amanda [name] taking differently: Take 25 mg by mouth at bedtime. )  . Multiple Vitamin (MULTIVITAMIN) tablet Take 1 tablet by mouth daily.    . NON FORMULARY Bausch and lomb ointment-Muro-//128, 5%ointment- Each eye nightly  . oxyCODONE (OXY IR/ROXICODONE) 5 MG immediate release tablet Take 1 tablet (5 mg total) by mouth every 4 (four) hours as needed for severe pain. For AFTER surgery only, do not take and drive  . polyethylene glycol (MIRALAX / GLYCOLAX) packet Take 17 g by mouth daily as needed.   Amanda Amanda [name] Amanda [name] Kitchen Propylene Glycol (SYSTANE BALANCE) 0.6 % SOLN Apply 1 drop to eye 2 (two) times daily. For dry eyes  .  rOPINIRole (REQUIP) 0.25 MG tablet Take 1 tablet (0.25 mg total) by mouth at bedtime.  . senna-docusate (SENOKOT-S) 8.6-50 MG tablet Take 2 tablets by mouth at bedtime. For AFTER surgery, do not take if having diarrhea  . valACYclovir (VALTREX) 500 MG tablet One tab bid as needed for outbreak   No facility-administered encounter medications on file as of 07/11/2019.    Allergy:  Allergies  Allergen Reactions  . Ambien [Zolpidem Tartrate] Other (See Comments)    Causes Neurological problems with very bad dizziness, pains, disorientation  . Cortisone Other (See Comments)    REACTION: "mania" post ESI  . Eszopiclone And Related Other (See Comments)    Causes Neurological problems with very bad dizziness, pains, disorientation  . Levofloxacin Other (See Comments)    Muscle spasms and jerking movements  . Dicloxacillin Other (See Comments)    Stomach problems, swelling of tongue  . Dilantin [Phenytoin Sodium Extended] Hives and Itching  . Erythromycin Other (See Comments)    Tears stomach up  . Avelox [Moxifloxacin Hcl In Nacl]     Pt unsure of reaction.  . Amanda Amanda [name] Amanda [name]     Guillain Barre in Amanda Amanda [name] past- 20 years ago  . Other     Celiac disease  . Nitrofuran Derivatives Other (See Comments)    unknown  . Phenytoin Sodium Extended Rash  . Trazodone And Nefazodone Other (See Comments)    dizzy    Social Hx:   Social History   Socioeconomic History  . Marital status: Married    Spouse name: Not on file  . Number of children: 2  . Years of education: Not on file  . Highest education level: Not on file  Occupational History  . Not on file  Tobacco Use  . Smoking status: Never Smoker  . Smokeless tobacco: Never Used  Substance and Sexual Activity  . Alcohol use: Yes    Alcohol/week: 7.0 standard drinks    Types: 7 Cans of beer per week    Comment: 1 beer nightly  . Drug use: Never  . Sexual activity: Yes    Partners: Male    Birth control/protection: Surgical, None     Comment: HYST-1st intercourse 69 yo-Fewer than 5 partners  Other Topics Concern  . Not on file  Social History Narrative   Married mother of 99 with 63 year old granddaughter (born in 2019)       Lives with husband in a three story home, no issue with stairs.    Highest level of education is an associates.       She exercises regularly, leads an active lifestyle, has a healthy diet, and is very socially engaged. Self-described "HSP: highly sensitive person" and therefore tends to worry more about symptoms than others might.      Hobbies: enjoys time  with friends, time on front porch with friends during covid, park daily, family time   Social Determinants of Health   Financial Resource Strain:   . Difficulty of Paying Living Expenses: Not on file  Food Insecurity:   . Worried About Charity fundraiser in Amanda Amanda [name] Last Year: Not on file  . Ran Out of Food in Amanda Amanda [name] Last Year: Not on file  Transportation Needs:   . Lack of Transportation (Medical): Not on file  . Lack of Transportation (Non-Medical): Not on file  Physical Activity:   . Days of Exercise per Week: Not on file  . Minutes of Exercise per Session: Not on file  Stress:   . Feeling of Stress : Not on file  Social Connections:   . Frequency of Communication with Friends and Family: Not on file  . Frequency of Social Gatherings with Friends and Family: Not on file  . Attends Religious Services: Not on file  . Active Member of Clubs or Organizations: Not on file  . Attends Archivist Meetings: Not on file  . Marital Status: Not on file  Intimate Partner Violence:   . Fear of Current or Ex-Partner: Not on file  . Emotionally Abused: Not on file  . Physically Abused: Not on file  . Sexually Abused: Not on file    Past Surgical Hx:  Past Surgical History:  Procedure Laterality Date  . ABDOMINAL HYSTERECTOMY  1986   ovaries remain  . BREAST REDUCTION SURGERY Bilateral 1999  . CATARACT EXTRACTION W/ INTRAOCULAR LENS   IMPLANT, BILATERAL  2019  . COLONOSCOPY  last one 11-22-2018  . ESOPHAGOGASTRODUODENOSCOPY    . KNEE ARTHROSCOPY Bilateral 2002  . SUBDURAL HEMATOMA EVACUATION VIA CRANIOTOMY  04-30-2009  @[facility]    left frontotemporapartietal   . THYROID LOBECTOMY Left 05-04-2001  @[facility]   dr Constance Holster  . VULVECTOMY N/A 03/12/2019   Procedure: WIDE LOCAL EXCISION OF CARCINOMA IS SITU OF VULVA;  Surgeon: Anastasio Auerbach, MD;  Location: [place];  Service: Gynecology;  Laterality: N/A;  request to follow in [place] block time requests one hour colposcope available in OR with acetic acid.  [name] N/A 06/17/2019   Procedure: WIDE LOCAL  EXCISION VULVECTOMY;  Surgeon: Everitt Amber, MD;  Location: [facility];  Service: Gynecology;  Laterality: N/A;    Past Medical Hx:  Past Medical History:  Diagnosis Date  . Anxiety   . Celiac disease   . Chronic constipation   . COVID-19 virus vaccine not available   . GERD (gastroesophageal reflux disease)    occasionally (per pt takes apple cider vinager)  . Heart palpitations    cardiologist-- dr Ellyn Hack--- event monitor 08-24-2011 epic ;  nuclear stress test 03-16-2011 (epic) normal w/ no ischemia, ef 65%;  echo 01-18-2013  ef 60-65%, G1DD  . Hiatal hernia   . History of subdural hematoma    12/ 23/ 2010  s/p  craniotomy w/ hematoma evacuation (pt had a fall w/ concussion)  per pt no residual  . History of thyroid cancer followed by dr Constance Holster---   s/p  left thyroidectomy --- per pt no radiation and no recurrence  . Hyperlipidemia   . Immunization, single disease 06/09/2019   1st dose moderma vaccine administered  . Insomnia   . OA (osteoarthritis)   . Osteopenia, T score -2.1 FRAX 9.4%/1.3% stable from prior DEXA 06/2015  . Restless leg syndrome   . Right thyroid nodule    followed by dr  rosen--- last ultrasound in epic 03-30-2018 stable , to bx  . STD (sexually transmitted disease), HSV   . Vulvar intraepithelial neoplasia  (VIN) grade 3 12/2018  . Wears glasses     Past Gynecological History:  See HPI No LMP recorded (lmp unknown). Amanda [name] has had a hysterectomy.  Family Hx:  Family History  Problem Relation Age of Onset  . Diabetes Maternal Aunt   . Breast cancer Maternal Aunt 60  . Liver cancer Father        alcohol related likely  . Failure to thrive Mother        8  . Heart disease Maternal Grandfather   . Hyperlipidemia Maternal Grandfather   . Hypertension Paternal Grandfather   . Colon cancer Neg Hx     Review of Systems:  Constitutional  Feels well,    ENT Normal appearing ears and nares bilaterally Skin/Breast  No rash, sores, jaundice, itching, dryness Cardiovascular  No chest pain, shortness of breath, or edema  Pulmonary  No cough or wheeze.  Gastro Intestinal  No nausea, vomitting, or diarrhoea. No bright red blood per rectum, no abdominal pain, change in bowel movement, or constipation.  Genito Urinary  No frequency, urgency, dysuria, no vulvar pruritis.  Musculo Skeletal  No myalgia, arthralgia, joint swelling or pain  Neurologic  No weakness, numbness, change in gait,  Psychology  No depression, anxiety, insomnia.   Vitals:  Blood pressure 133/75, pulse 75, temperature 97.8 F (36.6 C), temperature source Temporal, height 5' 2.5" (1.588 m), weight 159 lb 4 oz (72.2 kg), SpO2 99 %.  Physical Exam: WD in NAD Neck  Supple NROM, without any enlargements.  Lymph Node Survey No cervical supraclavicular or inguinal adenopathy Cardiovascular  Pulse normal rate, regularity and rhythm. S1 and S2 normal.  Lungs  Clear to auscultation bilateraly, without wheezes/crackles/rhonchi. Good air movement.  Skin  No rash/lesions/breakdown  Psychiatry  Alert and oriented to person, place, and time  Abdomen  Normoactive bowel sounds, abdomen soft, non-tender and nonobese without evidence of hernia. Back No CVA tenderness Genito Urinary  Vulva/vagina: incision healing  normally, suture material present, no cellulitis or infection. No lesions.  Rectal  Good tone, no masses no cul de sac nodularity.  Extremities  No bilateral cyanosis, clubbing or edema.   Thereasa Solo, MD  07/11/2019, 2:23 PM

## 2019-07-25 DIAGNOSIS — M1712 Unilateral primary osteoarthritis, left knee: Secondary | ICD-10-CM | POA: Diagnosis not present

## 2019-08-15 ENCOUNTER — Telehealth: Payer: Self-pay | Admitting: Family Medicine

## 2019-08-15 NOTE — Telephone Encounter (Signed)
Left message for patient to call back and schedule Medicare Annual Wellness Visit (AWV) either virtually/audio only OR in office. Whatever the patients preference is.  Last AWV 09/26/16; please schedule at anytime with [facility]Nurse Health Advisor at [facility].

## 2019-08-27 DIAGNOSIS — Z20828 Contact with and (suspected) exposure to other viral communicable diseases: Secondary | ICD-10-CM | POA: Diagnosis not present

## 2019-08-27 DIAGNOSIS — Z03818 Encounter for observation for suspected exposure to other biological agents ruled out: Secondary | ICD-10-CM | POA: Diagnosis not present

## 2019-09-02 DIAGNOSIS — M1712 Unilateral primary osteoarthritis, left knee: Secondary | ICD-10-CM | POA: Diagnosis not present

## 2019-09-10 DIAGNOSIS — M1712 Unilateral primary osteoarthritis, left knee: Secondary | ICD-10-CM | POA: Diagnosis not present

## 2019-09-12 DIAGNOSIS — M1712 Unilateral primary osteoarthritis, left knee: Secondary | ICD-10-CM | POA: Diagnosis not present

## 2019-09-17 DIAGNOSIS — M1712 Unilateral primary osteoarthritis, left knee: Secondary | ICD-10-CM | POA: Diagnosis not present

## 2019-09-19 DIAGNOSIS — M1712 Unilateral primary osteoarthritis, left knee: Secondary | ICD-10-CM | POA: Diagnosis not present

## 2019-09-23 ENCOUNTER — Telehealth: Payer: Self-pay | Admitting: *Deleted

## 2019-09-23 NOTE — Telephone Encounter (Signed)
Patient called and scheduled a follow up appt for September

## 2019-09-24 DIAGNOSIS — M25562 Pain in left knee: Secondary | ICD-10-CM | POA: Insufficient documentation

## 2019-09-24 DIAGNOSIS — M1712 Unilateral primary osteoarthritis, left knee: Secondary | ICD-10-CM | POA: Diagnosis not present

## 2019-09-27 ENCOUNTER — Ambulatory Visit (HOSPITAL_COMMUNITY)
Admission: RE | Admit: 2019-09-27 | Discharge: 2019-09-27 | Disposition: A | Discharge status: Home / Self Care | Payer: Medicare Other | Source: Ambulatory Visit | Attending: Physician Assistant | Admitting: Physician Assistant

## 2019-09-27 ENCOUNTER — Other Ambulatory Visit (HOSPITAL_COMMUNITY): Payer: Self-pay | Admitting: Physician Assistant

## 2019-09-27 ENCOUNTER — Other Ambulatory Visit: Payer: Self-pay

## 2019-09-27 DIAGNOSIS — M7989 Other specified soft tissue disorders: Secondary | ICD-10-CM | POA: Insufficient documentation

## 2019-09-27 DIAGNOSIS — M79662 Pain in left lower leg: Secondary | ICD-10-CM | POA: Insufficient documentation

## 2019-09-27 DIAGNOSIS — M79605 Pain in left leg: Secondary | ICD-10-CM | POA: Insufficient documentation

## 2019-09-27 DIAGNOSIS — M79604 Pain in right leg: Secondary | ICD-10-CM | POA: Diagnosis not present

## 2019-09-27 DIAGNOSIS — M79661 Pain in right lower leg: Secondary | ICD-10-CM | POA: Diagnosis not present

## 2019-09-27 NOTE — Progress Notes (Signed)
Left lower extremity venous duplex has been completed. Preliminary results can be found in CV Proc through chart review.  Results were given to [facility] PA.  09/27/19 3:55 PM Carlos Levering RVT

## 2019-10-01 DIAGNOSIS — M79662 Pain in left lower leg: Secondary | ICD-10-CM | POA: Diagnosis not present

## 2019-10-01 DIAGNOSIS — M79605 Pain in left leg: Secondary | ICD-10-CM | POA: Diagnosis not present

## 2019-10-02 DIAGNOSIS — D225 Melanocytic nevi of trunk: Secondary | ICD-10-CM | POA: Diagnosis not present

## 2019-10-02 DIAGNOSIS — D1801 Hemangioma of skin and subcutaneous tissue: Secondary | ICD-10-CM | POA: Diagnosis not present

## 2019-10-02 DIAGNOSIS — L821 Other seborrheic keratosis: Secondary | ICD-10-CM | POA: Diagnosis not present

## 2019-10-02 DIAGNOSIS — L814 Other melanin hyperpigmentation: Secondary | ICD-10-CM | POA: Diagnosis not present

## 2019-10-02 DIAGNOSIS — D2262 Melanocytic nevi of left upper limb, including shoulder: Secondary | ICD-10-CM | POA: Diagnosis not present

## 2019-10-02 DIAGNOSIS — Z85828 Personal history of other malignant neoplasm of skin: Secondary | ICD-10-CM | POA: Diagnosis not present

## 2019-10-04 DIAGNOSIS — M1712 Unilateral primary osteoarthritis, left knee: Secondary | ICD-10-CM | POA: Diagnosis not present

## 2019-10-04 DIAGNOSIS — M79662 Pain in left lower leg: Secondary | ICD-10-CM | POA: Diagnosis not present

## 2019-10-04 DIAGNOSIS — M170 Bilateral primary osteoarthritis of knee: Secondary | ICD-10-CM | POA: Diagnosis not present

## 2019-10-04 DIAGNOSIS — M1711 Unilateral primary osteoarthritis, right knee: Secondary | ICD-10-CM | POA: Diagnosis not present

## 2019-10-08 DIAGNOSIS — Z6827 Body mass index (BMI) 27.0-27.9, adult: Secondary | ICD-10-CM | POA: Diagnosis not present

## 2019-10-08 DIAGNOSIS — E663 Overweight: Secondary | ICD-10-CM | POA: Diagnosis not present

## 2019-10-08 DIAGNOSIS — M2550 Pain in unspecified joint: Secondary | ICD-10-CM | POA: Diagnosis not present

## 2019-10-10 ENCOUNTER — Telehealth: Payer: Self-pay

## 2019-10-10 NOTE — Telephone Encounter (Signed)
Amanda [name] received a letter from her regular gyn to schedule her pap smear.  Told her that Joylene John said that she needed to go ahead and schedule that with gyn as Dr. Denman George will f/u with her for vulvar check in September as scheduled.   Last Pap done 12-04-18. With GYN.

## 2019-10-17 ENCOUNTER — Encounter: Payer: Self-pay | Admitting: Family Medicine

## 2019-10-17 ENCOUNTER — Other Ambulatory Visit: Payer: Self-pay

## 2019-10-17 ENCOUNTER — Telehealth (INDEPENDENT_AMBULATORY_CARE_PROVIDER_SITE_OTHER): Payer: Medicare Other | Admitting: Family Medicine

## 2019-10-17 VITALS — Ht 62.5 in | Wt 161.0 lb

## 2019-10-17 DIAGNOSIS — G2581 Restless legs syndrome: Secondary | ICD-10-CM | POA: Diagnosis not present

## 2019-10-17 DIAGNOSIS — F5101 Primary insomnia: Secondary | ICD-10-CM

## 2019-10-17 DIAGNOSIS — E041 Nontoxic single thyroid nodule: Secondary | ICD-10-CM

## 2019-10-17 DIAGNOSIS — G4700 Insomnia, unspecified: Secondary | ICD-10-CM

## 2019-10-17 DIAGNOSIS — E785 Hyperlipidemia, unspecified: Secondary | ICD-10-CM | POA: Diagnosis not present

## 2019-10-17 MED ORDER — ROSUVASTATIN CALCIUM 5 MG PO TABS: 5.0000 mg | ORAL_TABLET | Freq: Every day | ORAL | 3 refills | Status: DC

## 2019-10-17 MED ORDER — CLONAZEPAM 1 MG PO TABS: 1.0000 mg | ORAL_TABLET | Freq: Every evening | ORAL | 1 refills | Status: DC | PRN

## 2019-10-17 NOTE — Patient Instructions (Addendum)
  Depression screen [facility] 2/9 06/04/2019 03/26/2019 11/12/2018  Decreased Interest 0 0 0  Down, Depressed, Hopeless 0 0 0  PHQ - 2 Score 0 0 0  Altered sleeping 0 0 0  Tired, decreased energy 0 0 0  Change in appetite 0 0 0  Feeling bad or failure about yourself  0 0 0  Trouble concentrating 0 0 0  Moving slowly or fidgety/restless 0 0 0  Suicidal thoughts 0 0 0  PHQ-9 Score 0 0 0  Difficult doing work/chores Not difficult at all Not difficult at all Not difficult at all  Some recent data might be hidden

## 2019-10-17 NOTE — Progress Notes (Signed)
Phone [phone] Virtual visit via Video note   Subjective:  Chief complaint: Chief Complaint  Patient presents with  . virtual  . Follow-up    This visit type was conducted due to national recommendations for restrictions regarding the COVID-19 Pandemic (e.g. social distancing).  This format is felt to be most appropriate for this patient at this time balancing risks to patient and risks to population by having him in for in person visit.  No physical exam was performed (except for noted visual exam or audio findings with Telehealth visits).    Our team/I connected with Amanda [name] at  3:00 PM EDT by a video enabled telemedicine application (doxy.me or caregility through epic) and verified that I am speaking with the correct person using two identifiers.  Location patient: Home-[place] Location provider: [facility], office Persons participating in the virtual visit:  patient  Our team/I discussed the limitations of evaluation and management by telemedicine and the availability of in person appointments. In light of current covid-19 pandemic, patient also understands that we are trying to protect them by minimizing in office contact if at all possible.  The patient expressed consent for telemedicine visit and agreed to proceed. Patient understands insurance will be billed.   Past Medical History-  Patient Active Problem List   Diagnosis Date Noted  . History of Guillain-Barre syndrome 04/01/2018    Priority: High  . Thyroid nodule 03/27/2018    Priority: High  . Celiac disease 11/27/2007    Priority: High  . Insomnia 07/05/2007    Priority: High  . History of thyroid cancer 10/03/2006    Priority: High  . History of subdural hematoma 03/26/2019    Priority: Medium  . Herpes simplex type 1 infection 11/01/2018    Priority: Medium  . Hyperlipidemia, unspecified 03/30/2018    Priority: Medium  . Fibromyalgia 03/29/2018    Priority: Medium  . Fuchs' corneal dystrophy,  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    Priority: Medium  . Insulin resistance 03/29/2018    Priority: Medium  . Grade I diastolic dysfunction 78/29/5621    Priority: Medium  . Dysthymia 09/26/2016    Priority: Medium  . Palpitations 01/17/2013    Priority: Medium  . GERD (gastroesophageal reflux disease)     Priority: Medium  . Restless leg syndrome, on Requip     Priority: Medium  . IBS (irritable bowel syndrome) 02/16/2009    Priority: Medium  . Osteoarthritis 07/05/2007    Priority: Medium  . Osteopenia 10/03/2006    Priority: Medium  . Mastalgia, left, followed by GYN 03/29/2018    Priority: Low  . History of hysterectomy 03/29/2018    Priority: Low  . Presbycusis of both ears 03/10/2017    Priority: Low  . Allergic rhinitis 06/25/2009    Priority: Low  . Diaphragmatic hernia 02/16/2009    Priority: Low  . Left carotid bruit 10/03/2006    Priority: Low  . 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    Medications- reviewed and updated Current Outpatient Medications  Medication Sig Dispense Refill  . APPLE CIDER VINEGAR PO Take 1 capsule by mouth 2 (two) times daily. Reported on 06/09/2015    . aspirin EC 81 MG tablet Take 81 mg by mouth daily.    . bisacodyl (DULCOLAX) 5 MG EC tablet Take 5 mg by mouth daily as needed for moderate constipation.    . Black Currant Seed Oil 500 MG CAPS Take by mouth daily.    [name] CALCIUM-VITAMIN D  PO Take 1 tablet by mouth daily.     . cholecalciferol (VITAMIN D3) 25 MCG (1000 UT) tablet Take 1,000 Units by mouth daily. Take 2 in the am takes with magnesium    . clonazePAM (KLONOPIN) 1 MG tablet Take 1 tablet (1 mg total) by mouth at bedtime as needed (sleep). for anxiety 90 tablet 1  . CO ENZYME Q-10 PO Take by mouth daily.     . Cyanocobalamin (VITAMIN B-12 ER PO) Take 5,000 mcg by mouth daily.    . diclofenac sodium (VOLTAREN) 1 % GEL APPLY TOPICALLY TO AFFECTED AREA 4 TIMES A DAY 100 g 1  . ibuprofen (ADVIL) 600 MG tablet  Take 1 tablet (600 mg total) by mouth every 6 (six) hours as needed for moderate pain. For AFTER surgery 30 tablet 1  . Melatonin 3 MG CAPS Take by mouth at bedtime as needed.     . metoprolol succinate (TOPROL-XL) 25 MG 24 hr tablet Take 1 tablet (25 mg total) by mouth daily. (Patient taking differently: Take 25 mg by mouth at bedtime. ) 90 tablet 3  . Multiple Vitamin (MULTIVITAMIN) tablet Take 1 tablet by mouth daily.      . NON FORMULARY Bausch and lomb ointment-Muro-//128, 5%ointment- Each eye nightly    . Propylene Glycol (SYSTANE BALANCE) 0.6 % SOLN Apply 1 drop to eye 2 (two) times daily. For dry eyes    . rOPINIRole (REQUIP) 0.25 MG tablet Take 1 tablet (0.25 mg total) by mouth at bedtime. 90 tablet 3  . rosuvastatin (CRESTOR) 5 MG tablet Take 1 tablet (5 mg total) by mouth daily. 90 tablet 3  . senna-docusate (SENOKOT-S) 8.6-50 MG tablet Take 2 tablets by mouth at bedtime. For AFTER surgery, do not take if having diarrhea 30 tablet 1  . valACYclovir (VALTREX) 500 MG tablet One tab bid as needed for outbreak 180 tablet 1   No current facility-administered medications for this visit.     Objective:  Ht 5' 2.5" (1.588 m)   Wt 161 lb (73 kg)   LMP  (LMP Unknown)   BMI 28.98 kg/m  self reported vitals Gen: NAD, resting comfortably Lungs: nonlabored, normal respiratory rate  Skin: appears dry, no obvious rash     Assessment and Plan     # Vulva excision- 2nd one due to high grade dysplasia. Has follow up in September and hoping for a good check and not having to have another removal with Dr. Everitt Amber.   #hyperlipidemia S: Medication:Atorvastatin 62m 1/2 tab. Has mental fog on full tablet.  Lab Results  Component Value Date   CHOL 178 01/22/2019   HDL 70.70 01/22/2019   LDLCALC 92 01/22/2019   LDLDIRECT 139.2 03/10/2008   TRIG 78.0 01/22/2019   CHOLHDL 3 01/22/2019   A/P: We discussed atorvastatin has a higher probability of crossing the blood-brain barrier-given  insofar we opted to change to rosuvastatin 5 mg daily #90 with 3 refills-she will finish what she has of atorvastatin and then switch over  # Restless leg syndrome, on Requip S: Patient reports good control on Requip Lab Results  Component Value Date   FERRITIN 26.2 10/04/2017  A/P: Reasonable control-continue current medications-if recurrent issues would be reasonable to try to boost iron levels-can discuss at next visit   #Insomnia/sleep disorder S:Medication: Clonazepam 0.5 mg before bed A/P: Reasonable control-continue current medication  #Thyroid nodule-1.5 cm right middle lobe.  Last imaged March 30, 2018.  Thyroid levels have been normal.  Can consider  repeat thyroid ultrasound at annual wellness visit  #Palpitations-remains on metoprolol through cardiology  #Osteopenia-follows with transfer gynecology.  #Hyperglycemia/insulin resistance-will be due for repeat A1c at follow-up Lab Results  Component Value Date   HGBA1C 5.7 06/04/2019   HGBA1C 5.8 09/19/2018   HGBA1C 5.7 10/04/2017     Recommended follow up: Annual wellness visit scheduled August 24 Future Appointments  Date Time Provider [place]  12/31/2019  2:40 PM Marin Olp, MD [facility]  01/24/2020  1:15 PM Everitt Amber, MD [facility] None    Lab/Order associations:   ICD-10-CM   1. Hyperlipidemia, unspecified hyperlipidemia type  E78.5   2. Primary insomnia  F51.01 clonazePAM (KLONOPIN) 1 MG tablet  3. Insomnia, unspecified type  G47.00   4. Restless leg syndrome, on Requip  G25.81   5. Thyroid nodule  E04.1     Meds ordered this encounter  Medications  . clonazePAM (KLONOPIN) 1 MG tablet    Sig: Take 1 tablet (1 mg total) by mouth at bedtime as needed (sleep). for anxiety    Dispense:  90 tablet    Refill:  1    This request is for a new prescription for a controlled substance as required by Federal/State law. DX Code Needed  .  . rosuvastatin (CRESTOR) 5 MG tablet    Sig: Take 1  tablet (5 mg total) by mouth daily.    Dispense:  90 tablet    Refill:  3   Return precautions advised.  Garret Reddish, MD

## 2019-11-05 ENCOUNTER — Other Ambulatory Visit: Payer: Self-pay | Admitting: Obstetrics and Gynecology

## 2019-11-05 DIAGNOSIS — Z1231 Encounter for screening mammogram for malignant neoplasm of breast: Secondary | ICD-10-CM

## 2019-11-21 ENCOUNTER — Telehealth (INDEPENDENT_AMBULATORY_CARE_PROVIDER_SITE_OTHER): Payer: Medicare Other | Admitting: Family Medicine

## 2019-11-21 ENCOUNTER — Encounter: Payer: Self-pay | Admitting: Family Medicine

## 2019-11-21 VITALS — Temp 97.3°F

## 2019-11-21 DIAGNOSIS — M1712 Unilateral primary osteoarthritis, left knee: Secondary | ICD-10-CM | POA: Diagnosis not present

## 2019-11-21 DIAGNOSIS — Z7189 Other specified counseling: Secondary | ICD-10-CM | POA: Diagnosis not present

## 2019-11-21 DIAGNOSIS — R05 Cough: Secondary | ICD-10-CM | POA: Diagnosis not present

## 2019-11-21 DIAGNOSIS — R059 Cough, unspecified: Secondary | ICD-10-CM

## 2019-11-21 DIAGNOSIS — R0981 Nasal congestion: Secondary | ICD-10-CM

## 2019-11-21 DIAGNOSIS — Z7185 Encounter for immunization safety counseling: Secondary | ICD-10-CM

## 2019-11-21 MED ORDER — BENZONATATE 100 MG PO CAPS: 100.0000 mg | ORAL_CAPSULE | Freq: Two times a day (BID) | ORAL | 0 refills | Status: DC | PRN

## 2019-11-21 NOTE — Progress Notes (Signed)
Virtual Visit via Video Note  I connected with Amanda [name]  on 11/21/19 at 12:20 PM EDT by a video enabled telemedicine application and verified that I am speaking with the correct person using two identifiers.  Location patient: home, [place] Location provider:work or home office Persons participating in the virtual visit: patient, provider  I discussed the limitations of evaluation and management by telemedicine and the availability of in person appointments. The patient expressed understanding and agreed to proceed.   HPI:  Acute visit for sinus issues: -started 4 days ago -symptoms include sinus congestion, PND, drainage, not as much energy, sneezing -has tried claritin, essential oils, sudafed -she did go on a vacation right before she got sick - went a lot of different places and was around a lot of people -denies fevers, body aches, excessive cough, SOB, NVD -she has doxycycline on hand and wondered if she should take it - fully vaccinated for COVID19 with Moderna   ROS: See pertinent positives and negatives per HPI.  Past Medical History:  Diagnosis Date  . Anxiety   . Celiac disease   . Chronic constipation   . COVID-19 virus vaccine not available   . GERD (gastroesophageal reflux disease)    occasionally (per pt takes apple cider vinager)  . Heart palpitations    cardiologist-- dr Ellyn Hack--- event monitor 08-24-2011 epic ;  nuclear stress test 03-16-2011 (epic) normal w/ no ischemia, ef 65%;  echo 01-18-2013  ef 60-65%, G1DD  . Hiatal hernia   . History of subdural hematoma    12/ 23/ 2010  s/p  craniotomy w/ hematoma evacuation (pt had a fall w/ concussion)  per pt no residual  . History of thyroid cancer followed by dr Constance Holster---   s/p  left thyroidectomy --- per pt no radiation and no recurrence  . Hyperlipidemia   . Immunization, single disease 06/09/2019   1st dose moderma vaccine administered  . Insomnia   . OA (osteoarthritis)   . Osteopenia, T score -2.1 FRAX  9.4%/1.3% stable from prior DEXA 06/2015  . Restless leg syndrome   . Right thyroid nodule    followed by dr Constance Holster--- last ultrasound in epic 03-30-2018 stable , to bx  . STD (sexually transmitted disease), HSV   . 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 Wears glasses     Past Surgical History:  Procedure Laterality Date  . ABDOMINAL HYSTERECTOMY  1986   ovaries remain  . BREAST REDUCTION SURGERY Bilateral 1999  . CATARACT EXTRACTION W/ INTRAOCULAR LENS  IMPLANT, BILATERAL  2019  . COLONOSCOPY  last one 11-22-2018  . ESOPHAGOGASTRODUODENOSCOPY    . KNEE ARTHROSCOPY Bilateral 2002  . SUBDURAL HEMATOMA EVACUATION VIA CRANIOTOMY  04-30-2009  @[facility]    left frontotemporapartietal   . THYROID LOBECTOMY Left 05-04-2001  @[facility]   dr Constance Holster  . VULVECTOMY N/A 03/12/2019   Procedure: WIDE LOCAL EXCISION OF CARCINOMA IS SITU OF VULVA;  Surgeon: Anastasio Auerbach, MD;  Location: [facility];  Service: Gynecology;  Laterality: N/A;  request to follow in [place] block time requests one hour colposcope available in OR with acetic acid.  [name] N/A 06/17/2019   Procedure: WIDE LOCAL  EXCISION VULVECTOMY;  Surgeon: Everitt Amber, MD;  Location: [facility];  Service: Gynecology;  Laterality: N/A;    Family History  Problem Relation Age of Onset  . Diabetes Maternal Aunt   . Breast cancer Maternal Aunt 60  . Liver cancer Father  alcohol related likely  . Failure to thrive Mother        82  . Heart disease Maternal Grandfather   . Hyperlipidemia Maternal Grandfather   . Hypertension Paternal Grandfather   . Colon cancer Neg Hx     SOCIAL HX: see hpi   Current Outpatient Medications:  .  APPLE CIDER VINEGAR PO, Take 1 capsule by mouth 2 (two) times daily. Reported on 06/09/2015, Disp: , Rfl:  .  aspirin EC 81 MG tablet, Take 81 mg by mouth daily., Disp: , Rfl:  .  bisacodyl (DULCOLAX) 5 MG EC tablet, Take 5 mg by mouth daily as needed  for moderate constipation., Disp: , Rfl:  .  Black Currant Seed Oil 500 MG CAPS, Take by mouth daily., Disp: , Rfl:  .  CALCIUM-VITAMIN D PO, Take 1 tablet by mouth daily. , Disp: , Rfl:  .  cholecalciferol (VITAMIN D3) 25 MCG (1000 UT) tablet, Take 1,000 Units by mouth daily. Take 2 in the am takes with magnesium, Disp: , Rfl:  .  clonazePAM (KLONOPIN) 1 MG tablet, Take 1 tablet (1 mg total) by mouth at bedtime as needed (sleep). for anxiety, Disp: 90 tablet, Rfl: 1 .  CO ENZYME Q-10 PO, Take by mouth daily. , Disp: , Rfl:  .  Cyanocobalamin (VITAMIN B-12 ER PO), Take 5,000 mcg by mouth daily., Disp: , Rfl:  .  diclofenac sodium (VOLTAREN) 1 % GEL, APPLY TOPICALLY TO AFFECTED AREA 4 TIMES A DAY, Disp: 100 g, Rfl: 1 .  escitalopram (LEXAPRO) 10 MG tablet, Take 10 mg by mouth daily., Disp: , Rfl:  .  ibuprofen (ADVIL) 600 MG tablet, Take 1 tablet (600 mg total) by mouth every 6 (six) hours as needed for moderate pain. For AFTER surgery, Disp: 30 tablet, Rfl: 1 .  Melatonin 3 MG CAPS, Take by mouth at bedtime as needed. , Disp: , Rfl:  .  metoprolol succinate (TOPROL-XL) 25 MG 24 hr tablet, Take 1 tablet (25 mg total) by mouth daily. (Patient taking differently: Take 25 mg by mouth at bedtime. ), Disp: 90 tablet, Rfl: 3 .  Multiple Vitamin (MULTIVITAMIN) tablet, Take 1 tablet by mouth daily.  , Disp: , Rfl:  .  NON FORMULARY, Bausch and lomb ointment-Muro-//128, 5%ointment- Each eye nightly, Disp: , Rfl:  .  Propylene Glycol (SYSTANE BALANCE) 0.6 % SOLN, Apply 1 drop to eye 2 (two) times daily. For dry eyes, Disp: , Rfl:  .  rOPINIRole (REQUIP) 0.25 MG tablet, Take 1 tablet (0.25 mg total) by mouth at bedtime., Disp: 90 tablet, Rfl: 3 .  rosuvastatin (CRESTOR) 5 MG tablet, Take 1 tablet (5 mg total) by mouth daily., Disp: 90 tablet, Rfl: 3 .  senna-docusate (SENOKOT-S) 8.6-50 MG tablet, Take 2 tablets by mouth at bedtime. For AFTER surgery, do not take if having diarrhea, Disp: 30 tablet, Rfl: 1 .   valACYclovir (VALTREX) 500 MG tablet, One tab bid as needed for outbreak, Disp: 180 tablet, Rfl: 1 .  benzonatate (TESSALON PERLES) 100 MG capsule, Take 1 capsule (100 mg total) by mouth 2 (two) times daily as needed., Disp: 20 capsule, Rfl: 0  EXAM:  VITALS per patient if applicable:  GENERAL: alert, oriented, appears well and in no acute distress  HEENT: atraumatic, conjunttiva clear, no obvious abnormalities on inspection of external nose and ears  NECK: normal movements of the head and neck  LUNGS: on inspection no signs of respiratory distress, breathing rate appears normal, no obvious gross SOB,  gasping or wheezing  CV: no obvious cyanosis  MS: moves all visible extremities without noticeable abnormality  PSYCH/NEURO: pleasant and cooperative, no obvious depression or anxiety, speech and thought processing grossly intact  ASSESSMENT AND PLAN:  Discussed the following assessment and plan:  Nasal congestion  Cough  Vaccine counseling  -we discussed possible serious and likely etiologies, options for evaluation and workup, limitations of telemedicine visit vs in person visit, treatment, treatment risks and precautions. Pt prefers to treat via telemedicine empirically rather then risking or undertaking an in person visit at this moment. Possible VURI versus allergic rhinitis vs other. Discussed is less likely a bacterial sinus infection given the duration and symptoms. Opted for tx with nasal saline, afrin x 3 days and tessalon rx for cough. Discussed typical course for VURI and return precautions. She asked about COVID19 testing or risk. Discussed that thought the COVID19 vaccine is very good, it does not eliminate the risk of COVID 100%. Discussed testing options and limitations, steps to take if positive test, etc. Patient agrees to stay home while sick, is considering testing. She agrees to seek prompt in person care if worsening, new symptoms arise, or if is not improving with  treatment.   I discussed the assessment and treatment plan with the patient. The patient was provided an opportunity to ask questions and all were answered. The patient agreed with the plan and demonstrated an understanding of the instructions.   The patient was advised to call back or seek an in-person evaluation if the symptoms worsen or if the condition fails to improve as anticipated.   Lucretia Kern, DO

## 2019-11-22 DIAGNOSIS — Z20822 Contact with and (suspected) exposure to covid-19: Secondary | ICD-10-CM | POA: Diagnosis not present

## 2019-11-22 DIAGNOSIS — Z03818 Encounter for observation for suspected exposure to other biological agents ruled out: Secondary | ICD-10-CM | POA: Diagnosis not present

## 2019-11-28 DIAGNOSIS — M1712 Unilateral primary osteoarthritis, left knee: Secondary | ICD-10-CM | POA: Diagnosis not present

## 2019-12-05 DIAGNOSIS — M1712 Unilateral primary osteoarthritis, left knee: Secondary | ICD-10-CM | POA: Diagnosis not present

## 2019-12-05 DIAGNOSIS — M25562 Pain in left knee: Secondary | ICD-10-CM | POA: Diagnosis not present

## 2019-12-06 ENCOUNTER — Ambulatory Visit (INDEPENDENT_AMBULATORY_CARE_PROVIDER_SITE_OTHER): Payer: Medicare Other | Admitting: Obstetrics and Gynecology

## 2019-12-06 ENCOUNTER — Encounter: Payer: Self-pay | Admitting: Obstetrics and Gynecology

## 2019-12-06 ENCOUNTER — Other Ambulatory Visit: Payer: Self-pay

## 2019-12-06 VITALS — BP 120/76 | Ht 66.0 in | Wt 163.0 lb

## 2019-12-06 DIAGNOSIS — Z01419 Encounter for gynecological examination (general) (routine) without abnormal findings: Secondary | ICD-10-CM

## 2019-12-06 DIAGNOSIS — Z1272 Encounter for screening for malignant neoplasm of vagina: Secondary | ICD-10-CM | POA: Diagnosis not present

## 2019-12-06 DIAGNOSIS — D071 Carcinoma in situ of vulva: Secondary | ICD-10-CM

## 2019-12-06 DIAGNOSIS — M8588 Other specified disorders of bone density and structure, other site: Secondary | ICD-10-CM

## 2019-12-06 DIAGNOSIS — Z9189 Other specified personal risk factors, not elsewhere classified: Secondary | ICD-10-CM

## 2019-12-06 DIAGNOSIS — M8580 Other specified disorders of bone density and structure, unspecified site: Secondary | ICD-10-CM

## 2019-12-06 DIAGNOSIS — Z86002 Personal history of in-situ neoplasm of other and unspecified genital organs: Secondary | ICD-10-CM

## 2019-12-06 DIAGNOSIS — Z9289 Personal history of other medical treatment: Secondary | ICD-10-CM

## 2019-12-06 NOTE — Addendum Note (Signed)
Addended by: Nelva Nay on: 12/06/2019 04:26 PM   Modules accepted: Orders

## 2019-12-06 NOTE — Progress Notes (Signed)
Amanda [name] [birth year] [id number]  SUBJECTIVE:  69 y.o. G2P2002 female here for an annual routine gynecologic exam and Pap smear. She has no gynecologic concerns.  Current Outpatient Medications  Medication Sig Dispense Refill  . APPLE CIDER VINEGAR PO Take 1 capsule by mouth 2 (two) times daily. Reported on 06/09/2015    . aspirin EC 81 MG tablet Take 81 mg by mouth daily.    . benzonatate (TESSALON PERLES) 100 MG capsule Take 1 capsule (100 mg total) by mouth 2 (two) times daily as needed. 20 capsule 0  . bisacodyl (DULCOLAX) 5 MG EC tablet Take 5 mg by mouth daily as needed for moderate constipation.    [name] CALCIUM-VITAMIN D PO Take 1 tablet by mouth daily.     . cholecalciferol (VITAMIN D3) 25 MCG (1000 UT) tablet Take 1,000 Units by mouth daily. Take 2 in the am takes with magnesium    . clonazePAM (KLONOPIN) 1 MG tablet Take 1 tablet (1 mg total) by mouth at bedtime as needed (sleep). for anxiety 90 tablet 1  . CO ENZYME Q-10 PO Take by mouth daily.     . Cyanocobalamin (VITAMIN B-12 ER PO) Take 5,000 mcg by mouth daily.    . diclofenac sodium (VOLTAREN) 1 % GEL APPLY TOPICALLY TO AFFECTED AREA 4 TIMES A DAY 100 g 1  . escitalopram (LEXAPRO) 10 MG tablet Take 10 mg by mouth daily.    [name] ibuprofen (ADVIL) 600 MG tablet Take 1 tablet (600 mg total) by mouth every 6 (six) hours as needed for moderate pain. For AFTER surgery 30 tablet 1  . Melatonin 3 MG CAPS Take by mouth at bedtime as needed.     . metoprolol succinate (TOPROL-XL) 25 MG 24 hr tablet Take 1 tablet (25 mg total) by mouth daily. (Patient taking differently: Take 25 mg by mouth at bedtime. ) 90 tablet 3  . Multiple Vitamin (MULTIVITAMIN) tablet Take 1 tablet by mouth daily.      . NON FORMULARY Bausch and lomb ointment-Muro-//128, 5%ointment- Each eye nightly    . Propylene Glycol (SYSTANE BALANCE) 0.6 % SOLN Apply 1 drop to eye 2 (two) times daily. For dry eyes    . rOPINIRole (REQUIP) 0.25 MG tablet Take 1 tablet  (0.25 mg total) by mouth at bedtime. 90 tablet 3  . rosuvastatin (CRESTOR) 5 MG tablet Take 1 tablet (5 mg total) by mouth daily. 90 tablet 3  . senna-docusate (SENOKOT-S) 8.6-50 MG tablet Take 2 tablets by mouth at bedtime. For AFTER surgery, do not take if having diarrhea 30 tablet 1  . valACYclovir (VALTREX) 500 MG tablet One tab bid as needed for outbreak 180 tablet 1  . Black Currant Seed Oil 500 MG CAPS Take by mouth daily. (Patient not taking: Reported on 12/06/2019)     No current facility-administered medications for this visit.   Allergies: Ambien [zolpidem tartrate], Cortisone, Eszopiclone and related, Levofloxacin, Dicloxacillin, Dilantin [phenytoin sodium extended], Erythromycin, Avelox [moxifloxacin hcl in nacl], Influenza vaccines, Other, Nitrofuran derivatives, Phenytoin sodium extended, and Trazodone and nefazodone  No LMP recorded (lmp unknown). Patient has had a hysterectomy.  Past medical history,surgical history, problem list, medications, allergies, family history and social history were all reviewed and documented as reviewed in the EPIC chart.  ROS:  Feeling well. No dyspnea or chest pain on exertion.  No abdominal pain, change in bowel habits, black or bloody stools.  No urinary tract symptoms. GYN ROS: no abnormal bleeding, pelvic pain or discharge, no  breast pain or new or enlarging lumps on self exam.  No neurological complaints.   OBJECTIVE:  BP 120/76   Ht 5' 6"  (1.676 m)   Wt 163 lb (73.9 kg)   LMP  (LMP Unknown)   BMI 26.31 kg/m  The patient appears well, alert, oriented x 3, in no distress. ENT normal.  Neck supple. No cervical or supraclavicular adenopathy or thyromegaly.  Lungs are clear, good air entry, no wheezes, rhonchi or rales. S1 and S2 normal, no murmurs, regular rate and rhythm.  Abdomen soft without tenderness, guarding, mass or organomegaly.  Neurological is normal, no focal findings.  BREAST EXAM: breasts appear normal, no suspicious masses,  no skin or nipple changes or axillary nodes, prior scar line from reduction surgery.  PELVIC EXAM: VULVA: normal appearing vulva with no masses, tenderness or lesions, atrophic changes, no abnormal findings, VAGINA: normal appearing atrophic vagina with normal color and discharge, no lesions, CERVIX: surgically absent, UTERUS: surgically absent, vaginal cuff normal, ADNEXA: no masses, nontender, PAP: Pap smear done today from vaginal cuff, thin-prep method  Chaperone: [name] present during the examination  ASSESSMENT:  69 y.o. [id number] here for annual gynecologic exam  PLAN:   1. Postmenopausal. 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  No vaginal bleeding.  No hot flashes or night sweats..   2. Pap smear 11/2018.  Had a wide local excision for VIN 3 and had a positive margin 03/12/2019.  Follow-up vulvar colposcopy and biopsy on 05/15/2019 at left lower labia majora showed VIN 2-3.  She was referred to gynecologic oncology and says she had a repeat excision at that time with residual high-grade dysplasia/VIN 2-3 with a positive margin.  She says she will be following up again on 01/24/2020 with Dr. Denman George. 3. Mammogram 11/2018.  Normal breast exam today.  She has an upcoming mammogram on 12/19/2019 4. Colonoscopy 2020.  Recommended that she follow up at the recommended interval.   5.  History of osteopenia.  Last DEXA was in 06/2015.  She staying active and supplementing with calcium and vitamin D.  Next DEXA recommended now so she plans to schedule this at checkout today. 6. Health maintenance.  No labs today as she normally has these completed with her primary care provider.  Return annually or sooner, prn.  Joseph Pierini MD 12/06/19

## 2019-12-09 LAB — PAP IG W/ RFLX HPV ASCU

## 2019-12-19 ENCOUNTER — Other Ambulatory Visit: Payer: Self-pay

## 2019-12-19 ENCOUNTER — Ambulatory Visit
Admission: RE | Admit: 2019-12-19 | Discharge: 2019-12-19 | Disposition: A | Discharge status: Home / Self Care | Payer: Medicare Other | Source: Ambulatory Visit | Attending: Obstetrics and Gynecology | Admitting: Obstetrics and Gynecology

## 2019-12-19 DIAGNOSIS — Z1231 Encounter for screening mammogram for malignant neoplasm of breast: Secondary | ICD-10-CM

## 2019-12-20 DIAGNOSIS — Z20822 Contact with and (suspected) exposure to covid-19: Secondary | ICD-10-CM | POA: Diagnosis not present

## 2019-12-20 DIAGNOSIS — Z03818 Encounter for observation for suspected exposure to other biological agents ruled out: Secondary | ICD-10-CM | POA: Diagnosis not present

## 2019-12-23 DIAGNOSIS — R1310 Dysphagia, unspecified: Secondary | ICD-10-CM | POA: Insufficient documentation

## 2019-12-23 DIAGNOSIS — R1314 Dysphagia, pharyngoesophageal phase: Secondary | ICD-10-CM | POA: Diagnosis not present

## 2019-12-23 DIAGNOSIS — K219 Gastro-esophageal reflux disease without esophagitis: Secondary | ICD-10-CM | POA: Diagnosis not present

## 2019-12-23 DIAGNOSIS — M26629 Arthralgia of temporomandibular joint, unspecified side: Secondary | ICD-10-CM | POA: Insufficient documentation

## 2019-12-24 ENCOUNTER — Other Ambulatory Visit: Payer: Self-pay

## 2019-12-24 ENCOUNTER — Ambulatory Visit (INDEPENDENT_AMBULATORY_CARE_PROVIDER_SITE_OTHER): Payer: Medicare Other

## 2019-12-24 ENCOUNTER — Other Ambulatory Visit: Payer: Self-pay | Admitting: Obstetrics and Gynecology

## 2019-12-24 DIAGNOSIS — M8580 Other specified disorders of bone density and structure, unspecified site: Secondary | ICD-10-CM

## 2019-12-24 DIAGNOSIS — Z780 Asymptomatic menopausal state: Secondary | ICD-10-CM

## 2019-12-24 DIAGNOSIS — Z01419 Encounter for gynecological examination (general) (routine) without abnormal findings: Secondary | ICD-10-CM

## 2019-12-24 DIAGNOSIS — M8589 Other specified disorders of bone density and structure, multiple sites: Secondary | ICD-10-CM | POA: Diagnosis not present

## 2019-12-24 DIAGNOSIS — M8588 Other specified disorders of bone density and structure, other site: Secondary | ICD-10-CM

## 2019-12-24 DIAGNOSIS — D071 Carcinoma in situ of vulva: Secondary | ICD-10-CM

## 2019-12-31 ENCOUNTER — Encounter: Payer: Medicare Other | Admitting: Family Medicine

## 2020-01-03 ENCOUNTER — Other Ambulatory Visit: Payer: Self-pay | Admitting: Otolaryngology

## 2020-01-03 DIAGNOSIS — M26629 Arthralgia of temporomandibular joint, unspecified side: Secondary | ICD-10-CM

## 2020-01-03 DIAGNOSIS — R1314 Dysphagia, pharyngoesophageal phase: Secondary | ICD-10-CM

## 2020-01-03 DIAGNOSIS — K219 Gastro-esophageal reflux disease without esophagitis: Secondary | ICD-10-CM

## 2020-01-06 ENCOUNTER — Ambulatory Visit
Admission: RE | Admit: 2020-01-06 | Discharge: 2020-01-06 | Disposition: A | Discharge status: Home / Self Care | Payer: Medicare Other | Source: Ambulatory Visit | Attending: Otolaryngology | Admitting: Otolaryngology

## 2020-01-06 DIAGNOSIS — R1314 Dysphagia, pharyngoesophageal phase: Secondary | ICD-10-CM

## 2020-01-06 DIAGNOSIS — M26629 Arthralgia of temporomandibular joint, unspecified side: Secondary | ICD-10-CM

## 2020-01-06 DIAGNOSIS — K224 Dyskinesia of esophagus: Secondary | ICD-10-CM | POA: Diagnosis not present

## 2020-01-06 DIAGNOSIS — K219 Gastro-esophageal reflux disease without esophagitis: Secondary | ICD-10-CM

## 2020-01-06 DIAGNOSIS — K2100 Gastro-esophageal reflux disease with esophagitis, without bleeding: Secondary | ICD-10-CM | POA: Diagnosis not present

## 2020-01-08 ENCOUNTER — Telehealth: Payer: Self-pay | Admitting: *Deleted

## 2020-01-08 ENCOUNTER — Other Ambulatory Visit: Payer: Self-pay

## 2020-01-08 ENCOUNTER — Ambulatory Visit (INDEPENDENT_AMBULATORY_CARE_PROVIDER_SITE_OTHER): Payer: Medicare Other | Admitting: Obstetrics and Gynecology

## 2020-01-08 ENCOUNTER — Encounter: Payer: Self-pay | Admitting: Obstetrics and Gynecology

## 2020-01-08 VITALS — BP 118/74

## 2020-01-08 DIAGNOSIS — M8588 Other specified disorders of bone density and structure, other site: Secondary | ICD-10-CM | POA: Diagnosis not present

## 2020-01-08 DIAGNOSIS — M8589 Other specified disorders of bone density and structure, multiple sites: Secondary | ICD-10-CM

## 2020-01-08 NOTE — Telephone Encounter (Signed)
-----   Message from Joseph Pierini, MD sent at 01/08/2020  1:45 PM EDT ----- Regarding: Prolia Hello, this patient indicated she may be interested in starting Prolia.  She will think about it a little more and then will let us know.  I did have her check calcium and creatinine level today in anticipation that she will move forward with that.  Thank you

## 2020-01-08 NOTE — Progress Notes (Signed)
   Amanda [name] January 28, 1951 [id number]  SUBJECTIVE:  69 y.o. G2P2002 female presents for discussion of recent DEXA from 12/24/2019.  T score -2.4 in AP spine, osteopenia also noted in bilateral hips.  FRAX 12% / 2.4%.  She does note feeling some achiness in the hips and low back in the last year.  She has previously been on Fosamax and then discontinued several years ago.  She says she does get some calcium in her diet with daily yogurt and almond milk.  She does stay physically active and walks 2 miles most days in the park.   Allergies: Ambien [zolpidem tartrate], Cortisone, Eszopiclone and related, Levofloxacin, Dicloxacillin, Dilantin [phenytoin sodium extended], Erythromycin, Avelox [moxifloxacin hcl in nacl], Influenza vaccines, Other, Nitrofuran derivatives, Phenytoin sodium extended, and Trazodone and nefazodone  No LMP recorded (lmp unknown). Patient has had a hysterectomy.  Past medical history,surgical history, problem list, medications, allergies, family history and social history were all reviewed and documented as reviewed in the EPIC chart.  ROS: Positives as discussed in HPI, remainder negative.  OBJECTIVE:  BP 118/74   LMP  (LMP Unknown)  The patient appears well, alert, oriented x 3, in no distress.  Exam deferred due to consultative nature of visit   ASSESSMENT:  69 y.o. [id number] with osteopenia here for discussion of recent DEXA results  PLAN:  We discussed IV bisphosphonates and Prolia. Would not do oral bisphosphonate due to reported esophageal stricture.  Her DEXA indicates borderline high major hip fracture risk of 2.4% just below the threshold of 3% for treatment, in addition to declining BMD in her spine, so I would recommend considering pharmacologic therapy. Risks of osteoporosis treatment including atypical fractures, osteonecrosis of jaw, calcium mineral imbalance, infection/immune system impairment, renal dysfunction, skin reaction, malignancy and  potential side effects are reviewed.  Does bring up issues with gluten intolerance and GI issues which make her little hesitant towards trying Prolia. She would like to further consider her options.  We will check calcium, vitamin D, and creatinine level today.  She will let us know if she would like to pursue Prolia treatment.   Joseph Pierini MD 01/08/20

## 2020-01-09 ENCOUNTER — Telehealth: Payer: Self-pay | Admitting: Family Medicine

## 2020-01-09 LAB — VITAMIN D 25 HYDROXY (VIT D DEFICIENCY, FRACTURES): Vit D, 25-Hydroxy: 52 ng/mL (ref 30–100)

## 2020-01-09 LAB — CREATININE, SERUM: Creat: 0.86 mg/dL (ref 0.50–0.99)

## 2020-01-09 LAB — CALCIUM: Calcium: 10.3 mg/dL (ref 8.6–10.4)

## 2020-01-09 NOTE — Telephone Encounter (Signed)
Please advise 

## 2020-01-09 NOTE — Telephone Encounter (Signed)
  You can write the following letter for patient. 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    To whom it may concern,  Amanda [name] is a primary care patient of mine. Patient and I have discussed with the delta variant and how far out she is from her initial COVID-19 vaccinations as well as her overall health she would potentially be at risk for COVID-19 related illness. 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 If at all possible, I would greatly appreciate your consideration for allowing her not to travel at this time.  Thanks, Garret Reddish

## 2020-01-09 NOTE — Telephone Encounter (Signed)
Patient called in and stated she had a trip planned to go to [place] but she doesn't feel comfortable going now. Patient wants to know if Dr. Yong Channel can write a note so she can get her money back for her Plane ticket. Patient stated with her age and the Delta Variant going around she would rather just not go but with her insurance she needs a note from her PCP.

## 2020-01-10 ENCOUNTER — Encounter: Payer: Self-pay | Admitting: Family Medicine

## 2020-01-10 NOTE — Telephone Encounter (Signed)
Spoke to pt told her Dr. 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 Pt verbalized understanding. Told pt she can access the letter from her My Chart or we can print it out and she can pick it up. Pt verbalized understanding and said she will access her My chart. Told her if you have any trouble let us know and we will gladly print it for you. Pt verbalized understanding.

## 2020-01-11 ENCOUNTER — Telehealth: Payer: Medicare Other | Admitting: Family Medicine

## 2020-01-11 ENCOUNTER — Encounter: Payer: Self-pay | Admitting: Family Medicine

## 2020-01-14 NOTE — Progress Notes (Signed)
Cancelled.  

## 2020-01-14 NOTE — Progress Notes (Signed)
   Subjective:    Patient ID: Amanda [name], female    DOB: June 21, 1950, 69 y.o.   MRN: [id number]  HPI Cancelled    Review of Systems     Objective:   Physical Exam        Assessment & Plan:

## 2020-01-24 ENCOUNTER — Inpatient Hospital Stay: Payer: Medicare Other | Attending: Gynecologic Oncology | Admitting: Gynecologic Oncology

## 2020-01-24 ENCOUNTER — Other Ambulatory Visit: Payer: Self-pay

## 2020-01-24 ENCOUNTER — Encounter: Payer: Self-pay | Admitting: Gynecologic Oncology

## 2020-01-24 DIAGNOSIS — Z85850 Personal history of malignant neoplasm of thyroid: Secondary | ICD-10-CM | POA: Insufficient documentation

## 2020-01-24 DIAGNOSIS — G2581 Restless legs syndrome: Secondary | ICD-10-CM | POA: Diagnosis not present

## 2020-01-24 DIAGNOSIS — Z79899 Other long term (current) drug therapy: Secondary | ICD-10-CM | POA: Diagnosis not present

## 2020-01-24 DIAGNOSIS — Z8349 Family history of other endocrine, nutritional and metabolic diseases: Secondary | ICD-10-CM | POA: Diagnosis not present

## 2020-01-24 DIAGNOSIS — K219 Gastro-esophageal reflux disease without esophagitis: Secondary | ICD-10-CM | POA: Insufficient documentation

## 2020-01-24 DIAGNOSIS — Z833 Family history of diabetes mellitus: Secondary | ICD-10-CM | POA: Insufficient documentation

## 2020-01-24 DIAGNOSIS — Z8249 Family history of ischemic heart disease and other diseases of the circulatory system: Secondary | ICD-10-CM | POA: Insufficient documentation

## 2020-01-24 DIAGNOSIS — Z7982 Long term (current) use of aspirin: Secondary | ICD-10-CM | POA: Diagnosis not present

## 2020-01-24 DIAGNOSIS — F419 Anxiety disorder, unspecified: Secondary | ICD-10-CM | POA: Insufficient documentation

## 2020-01-24 DIAGNOSIS — D071 Carcinoma in situ of vulva: Secondary | ICD-10-CM | POA: Insufficient documentation

## 2020-01-24 DIAGNOSIS — Z791 Long term (current) use of non-steroidal anti-inflammatories (NSAID): Secondary | ICD-10-CM | POA: Insufficient documentation

## 2020-01-24 DIAGNOSIS — Z803 Family history of malignant neoplasm of breast: Secondary | ICD-10-CM | POA: Diagnosis not present

## 2020-01-24 DIAGNOSIS — M8580 Other specified disorders of bone density and structure, unspecified site: Secondary | ICD-10-CM | POA: Diagnosis not present

## 2020-01-24 DIAGNOSIS — E785 Hyperlipidemia, unspecified: Secondary | ICD-10-CM | POA: Insufficient documentation

## 2020-01-24 DIAGNOSIS — Z90710 Acquired absence of both cervix and uterus: Secondary | ICD-10-CM | POA: Insufficient documentation

## 2020-01-24 DIAGNOSIS — Z800 Family history of malignant neoplasm of digestive organs: Secondary | ICD-10-CM | POA: Insufficient documentation

## 2020-01-24 NOTE — Patient Instructions (Signed)
Dr Denman George recommends monthly inspections of the vulva with a hand mirror or phone image (part the lips of the vulva so that you can see the opening). You will see a red area at the back of the left side (this is normal) but if you notice any other new changes, let her know.  Please contact Dr Serita Grit office (at [phone]) in or after April, 2022 to request an appointment with her for September, 2022.

## 2020-01-24 NOTE — Progress Notes (Signed)
Follow-up Note: Gyn-Onc  Consult was initially requested by Dr. Dellis Filbert for the evaluation of Amanda [name] 69 y.o. female  CC:  Chief Complaint  Patient presents with  . VIN III    Assessment/Plan:  Amanda [name]  is a 69 y.o.  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   She is doing well postop we discussed the increased risk for recurrence based on her 2 prior episodes of VIN 3.  I will see her back in 12 months time for vulvar inspection.  HPI: [name] is a 69 year old P2 who was seen in consultation at the request of Dr Dellis Filbert for evaluation of VIN 3.   The patient reported a history of VIN 3 in November 2020.  This was removed by Dr. Phineas Real with a wide local excision on March 12, 2019.  Pathology from that excision revealed VIN 3 with positive margins.  The patient was monitored in the postoperative period and developed symptomatic irritation and a raised area on the posterior left vulvar at the site of the prior excision.  This was biopsied on May 14, 2018 and revealed VIN 2-3.  Interval Hx:  On June 17, 2019 she underwent wide local excision with partial simple left posterior vulvectomy.  Intraoperative findings were significant for left posterior vulvar leukoplakia and acetowhite changes measuring approximately 2 cm in crossing the midline at the posterior fourchette.  Surgery was uncomplicated.  Final pathology revealed VIN 2-3 with dysplasia involving the 10 through 3:00 margins towards the medial side  Since surgery she is done well with no complications or concerns. She denied symptoms of recurrence.   Current Meds:  Outpatient Encounter Medications as of 01/24/2020  Medication Sig  . Alum Hydroxide-Mag Carbonate (GAVISCON PO) Take by mouth.  . APPLE CIDER VINEGAR PO Take 1 capsule by mouth 2 (two) times daily. Reported on 06/09/2015  . aspirin EC 81 MG tablet Take 81 mg by mouth daily.  .  bisacodyl (DULCOLAX) 5 MG EC tablet Take 5 mg by mouth daily as needed for moderate constipation.  . Black Currant Seed Oil 500 MG CAPS Take by mouth daily.   [name] CALCIUM-VITAMIN D PO Take 1 tablet by mouth daily.   . cholecalciferol (VITAMIN D3) 25 MCG (1000 UT) tablet Take 1,000 Units by mouth daily. Take 2 in the am takes with magnesium  . clonazePAM (KLONOPIN) 1 MG tablet Take 1 tablet (1 mg total) by mouth at bedtime as needed (sleep). for anxiety  . CO ENZYME Q-10 PO Take by mouth daily.   . Cyanocobalamin (VITAMIN B-12 ER PO) Take 5,000 mcg by mouth daily.  . diclofenac sodium (VOLTAREN) 1 % GEL APPLY TOPICALLY TO AFFECTED AREA 4 TIMES A DAY  . escitalopram (LEXAPRO) 10 MG tablet Take 10 mg by mouth daily.  . famotidine (PEPCID) 20 MG tablet Take 20 mg by mouth 2 (two) times daily.  [name] ibuprofen (ADVIL) 600 MG tablet Take 1 tablet (600 mg total) by mouth every 6 (six) hours as needed for moderate pain. For AFTER surgery  . Melatonin 3 MG CAPS Take by mouth at bedtime as needed.   . metoprolol succinate (TOPROL-XL) 25 MG 24 hr tablet Take 1 tablet (25 mg total) by mouth daily. (Patient taking differently: Take 25 mg by mouth at bedtime. )  . Multiple Vitamin (MULTIVITAMIN) tablet Take 1 tablet by mouth daily.    . NON FORMULARY Bausch and lomb  ointment-Muro-//128, 5%ointment- Each eye nightly  . Propylene Glycol (SYSTANE BALANCE) 0.6 % SOLN Apply 1 drop to eye 2 (two) times daily. For dry eyes  . rOPINIRole (REQUIP) 0.25 MG tablet Take 1 tablet (0.25 mg total) by mouth at bedtime.  . rosuvastatin (CRESTOR) 5 MG tablet Take 1 tablet (5 mg total) by mouth daily.  [name] senna-docusate (SENOKOT-S) 8.6-50 MG tablet Take 2 tablets by mouth at bedtime. For AFTER surgery, do not take if having diarrhea  . valACYclovir (VALTREX) 500 MG tablet One tab bid as needed for outbreak   No facility-administered encounter medications on file as of 01/24/2020.    Allergy:  Allergies  Allergen Reactions  .  Ambien [Zolpidem Tartrate] Other (See Comments)    Causes Neurological problems with very bad dizziness, pains, disorientation  . Cortisone Other (See Comments)    REACTION: "mania" post ESI  . Eszopiclone And Related Other (See Comments)    Causes Neurological problems with very bad dizziness, pains, disorientation  . Levofloxacin Other (See Comments)    Muscle spasms and jerking movements  . Dicloxacillin Other (See Comments)    Stomach problems, swelling of tongue  . Dilantin [Phenytoin Sodium Extended] Hives and Itching  . Erythromycin Other (See Comments)    Tears stomach up  . Avelox [Moxifloxacin Hcl In Nacl]     Pt unsure of reaction.  . Influenza Vaccines     Guillain Barre in the past- 20 years ago  . Other     Celiac disease  . Nitrofuran Derivatives Other (See Comments)    unknown  . Phenytoin Sodium Extended Rash  . Trazodone And Nefazodone Other (See Comments)    dizzy    Social Hx:   Social History   Socioeconomic History  . Marital status: Married    Spouse name: Not on file  . Number of children: 2  . Years of education: Not on file  . Highest education level: Not on file  Occupational History  . Not on file  Tobacco Use  . Smoking status: Never Smoker  . Smokeless tobacco: Never Used  Vaping Use  . Vaping Use: Never used  Substance and Sexual Activity  . Alcohol use: Yes    Alcohol/week: 7.0 standard drinks    Types: 7 Cans of beer per week    Comment: 1 beer nightly  . Drug use: Never  . Sexual activity: Yes    Partners: Male    Birth control/protection: Surgical, None    Comment: HYST-1st intercourse 69 yo-Fewer than 5 partners  Other Topics Concern  . Not on file  Social History Narrative   Married mother of 15 with 13 year old granddaughter (born in 2019). Her son Thurmond Butts is patient of Dr. Yong Channel       Lives with husband in a three story home, no issue with stairs.    Highest level of education is an associates.       She exercises  regularly, leads an active lifestyle, has a healthy diet, and is very socially engaged. Self-described "HSP: highly sensitive person" and therefore tends to worry more about symptoms than others might.      Hobbies: enjoys time with friends, time on front porch with friends during covid, park daily, family time   Social Determinants of Health   Financial Resource Strain:   . Difficulty of Paying Living Expenses: Not on file  Food Insecurity:   . Worried About Charity fundraiser in the Last Year: Not on file  .  Ran Out of Food in the Last Year: Not on file  Transportation Needs:   . Lack of Transportation (Medical): Not on file  . Lack of Transportation (Non-Medical): Not on file  Physical Activity:   . Days of Exercise per Week: Not on file  . Minutes of Exercise per Session: Not on file  Stress:   . Feeling of Stress : Not on file  Social Connections:   . Frequency of Communication with Friends and Family: Not on file  . Frequency of Social Gatherings with Friends and Family: Not on file  . Attends Religious Services: Not on file  . Active Member of Clubs or Organizations: Not on file  . Attends Archivist Meetings: Not on file  . Marital Status: Not on file  Intimate Partner Violence:   . Fear of Current or Ex-Partner: Not on file  . Emotionally Abused: Not on file  . Physically Abused: Not on file  . Sexually Abused: Not on file    Past Surgical Hx:  Past Surgical History:  Procedure Laterality Date  . ABDOMINAL HYSTERECTOMY  1986   ovaries remain  . BREAST REDUCTION SURGERY Bilateral 1999  . CATARACT EXTRACTION W/ INTRAOCULAR LENS  IMPLANT, BILATERAL  2019  . COLONOSCOPY  last one 11-22-2018  . ESOPHAGOGASTRODUODENOSCOPY    . KNEE ARTHROSCOPY Bilateral 2002  . SUBDURAL HEMATOMA EVACUATION VIA CRANIOTOMY  04-30-2009  @[facility]    left frontotemporapartietal   . THYROID LOBECTOMY Left 05-04-2001  @[facility]   dr Constance Holster  . VULVECTOMY N/A 03/12/2019   Procedure: WIDE LOCAL  EXCISION OF CARCINOMA IS SITU OF VULVA;  Surgeon: Anastasio Auerbach, MD;  Location: [facility];  Service: Gynecology;  Laterality: N/A;  request to follow in [place] block time requests one hour colposcope available in OR with acetic acid.  [name] N/A 06/17/2019   Procedure: WIDE LOCAL  EXCISION VULVECTOMY;  Surgeon: Everitt Amber, MD;  Location: [facility];  Service: Gynecology;  Laterality: N/A;    Past Medical Hx:  Past Medical History:  Diagnosis Date  . Anxiety   . Celiac disease   . Chronic constipation   . COVID-19 virus vaccine not available   . GERD (gastroesophageal reflux disease)    occasionally (per pt takes apple cider vinager)  . Heart palpitations    cardiologist-- dr Ellyn Hack--- event monitor 08-24-2011 epic ;  nuclear stress test 03-16-2011 (epic) normal w/ no ischemia, ef 65%;  echo 01-18-2013  ef 60-65%, G1DD  . Hiatal hernia   . History of subdural hematoma    12/ 23/ 2010  s/p  craniotomy w/ hematoma evacuation (pt had a fall w/ concussion)  per pt no residual  . History of thyroid cancer followed by dr Constance Holster---   s/p  left thyroidectomy --- per pt no radiation and no recurrence  . Hyperlipidemia   . Immunization, single disease 06/09/2019   1st dose moderma vaccine administered  . Insomnia   . OA (osteoarthritis)   . Osteopenia, T score -2.1 FRAX 9.4%/1.3% stable from prior DEXA 06/2015  . Restless leg syndrome   . Right thyroid nodule    followed by dr Constance Holster--- last ultrasound in epic 03-30-2018 stable , to bx  . STD (sexually transmitted disease), HSV   . 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 Wears glasses     Past Gynecological History:  See HPI No LMP recorded (lmp unknown). Patient has had a hysterectomy.  Family Hx:  Family History  Problem Relation Age of Onset  . Diabetes Maternal Aunt   . Breast cancer Maternal Aunt 60  . Liver cancer Father        alcohol related likely  .  Failure to thrive Mother        15  . Heart disease Maternal Grandfather   . Hyperlipidemia Maternal Grandfather   . Hypertension Paternal Grandfather   . Colon cancer Neg Hx     Review of Systems:  Constitutional  Feels well,    ENT Normal appearing ears and nares bilaterally Skin/Breast  No rash, sores, jaundice, itching, dryness Cardiovascular  No chest pain, shortness of breath, or edema  Pulmonary  No cough or wheeze.  Gastro Intestinal  No nausea, vomitting, or diarrhoea. No bright red blood per rectum, no abdominal pain, change in bowel movement, or constipation.  Genito Urinary  No frequency, urgency, dysuria, no vulvar pruritis.  Musculo Skeletal  No myalgia, arthralgia, joint swelling or pain  Neurologic  No weakness, numbness, change in gait,  Psychology  No depression, anxiety, insomnia.   Vitals:  There were no vitals taken for this visit.  Physical Exam: WD in NAD Neck  Supple NROM, without any enlargements.  Lymph Node Survey No cervical supraclavicular or inguinal adenopathy Cardiovascular  Pulse normal rate, regularity and rhythm. S1 and S2 normal.  Lungs  Clear to auscultation bilateraly, without wheezes/crackles/rhonchi. Good air movement.  Skin  No rash/lesions/breakdown  Psychiatry  Alert and oriented to person, place, and time  Abdomen  Normoactive bowel sounds, abdomen soft, non-tender and nonobese without evidence of hernia. Back No CVA tenderness Genito Urinary  Vulva/vagina: incision and slightly erythematous. Acetic acid applied to vulva. No lesions identified.  Rectal  Good tone, no masses no cul de sac nodularity.  Extremities  No bilateral cyanosis, clubbing or edema.   Thereasa Solo, MD  01/24/2020, 2:23 PM

## 2020-01-28 DIAGNOSIS — K449 Diaphragmatic hernia without obstruction or gangrene: Secondary | ICD-10-CM | POA: Diagnosis not present

## 2020-01-28 DIAGNOSIS — Z85850 Personal history of malignant neoplasm of thyroid: Secondary | ICD-10-CM | POA: Diagnosis not present

## 2020-01-28 DIAGNOSIS — K219 Gastro-esophageal reflux disease without esophagitis: Secondary | ICD-10-CM | POA: Diagnosis not present

## 2020-01-28 DIAGNOSIS — K5900 Constipation, unspecified: Secondary | ICD-10-CM | POA: Diagnosis not present

## 2020-01-28 DIAGNOSIS — R5383 Other fatigue: Secondary | ICD-10-CM | POA: Diagnosis not present

## 2020-01-30 NOTE — Telephone Encounter (Signed)
Insurance will not pay for Prolia due to not having osteoporosis. Pt has osteopenia. Is there another medication you recommend.

## 2020-01-30 NOTE — Telephone Encounter (Signed)
Can we do reclast IV?

## 2020-01-31 DIAGNOSIS — R1314 Dysphagia, pharyngoesophageal phase: Secondary | ICD-10-CM | POA: Diagnosis not present

## 2020-01-31 DIAGNOSIS — K2960 Other gastritis without bleeding: Secondary | ICD-10-CM | POA: Diagnosis not present

## 2020-01-31 DIAGNOSIS — K2980 Duodenitis without bleeding: Secondary | ICD-10-CM | POA: Diagnosis not present

## 2020-01-31 DIAGNOSIS — K259 Gastric ulcer, unspecified as acute or chronic, without hemorrhage or perforation: Secondary | ICD-10-CM | POA: Diagnosis not present

## 2020-01-31 DIAGNOSIS — K228 Other specified diseases of esophagus: Secondary | ICD-10-CM | POA: Diagnosis not present

## 2020-01-31 DIAGNOSIS — R1310 Dysphagia, unspecified: Secondary | ICD-10-CM | POA: Diagnosis not present

## 2020-01-31 DIAGNOSIS — K2950 Unspecified chronic gastritis without bleeding: Secondary | ICD-10-CM | POA: Diagnosis not present

## 2020-01-31 DIAGNOSIS — J320 Chronic maxillary sinusitis: Secondary | ICD-10-CM | POA: Diagnosis not present

## 2020-01-31 DIAGNOSIS — K219 Gastro-esophageal reflux disease without esophagitis: Secondary | ICD-10-CM | POA: Diagnosis not present

## 2020-02-13 ENCOUNTER — Telehealth: Payer: Self-pay

## 2020-02-13 NOTE — Telephone Encounter (Signed)
Amanda [name] states that she notice on the left labia 4 red dots. They are smaller then a the top of a pin.  The area is sore to touch,burns with urination, under garments irritate the area with ambulation. There is a dark raised area below the red dots. This area is not sore to the touch. Onset 3 days ago. Pt sent pictures  for Dr. Denman George to review via E-mail. Fowarded to [employer], NP to review. Pt has ML:PXOJNN simples type 1. She has taken a few doses of Valtrex 500 mg bid.  It has not relieved the pain in the area.

## 2020-02-13 NOTE — Telephone Encounter (Signed)
Told [name] that Amanda [name] reviewed the pictures of labia. Melissa states that the red dot area looks like an abrasion.  She can apply a swipe of neosporin ointment after toileting. The dark area looks like a mole. She is to call us if the area does not resolve or gets worse. Pt verbalized understanding.

## 2020-02-14 ENCOUNTER — Encounter: Payer: Self-pay | Admitting: Family Medicine

## 2020-02-14 ENCOUNTER — Other Ambulatory Visit: Payer: Self-pay

## 2020-02-14 ENCOUNTER — Ambulatory Visit (INDEPENDENT_AMBULATORY_CARE_PROVIDER_SITE_OTHER): Payer: Medicare Other | Admitting: Family Medicine

## 2020-02-14 VITALS — BP 118/84 | HR 73 | Temp 98.4°F | Resp 18 | Ht 66.0 in | Wt 161.6 lb

## 2020-02-14 DIAGNOSIS — R739 Hyperglycemia, unspecified: Secondary | ICD-10-CM

## 2020-02-14 DIAGNOSIS — G2581 Restless legs syndrome: Secondary | ICD-10-CM

## 2020-02-14 DIAGNOSIS — E8881 Metabolic syndrome: Secondary | ICD-10-CM | POA: Diagnosis not present

## 2020-02-14 DIAGNOSIS — Z1152 Encounter for screening for COVID-19: Secondary | ICD-10-CM

## 2020-02-14 DIAGNOSIS — E785 Hyperlipidemia, unspecified: Secondary | ICD-10-CM | POA: Diagnosis not present

## 2020-02-14 DIAGNOSIS — G4700 Insomnia, unspecified: Secondary | ICD-10-CM

## 2020-02-14 DIAGNOSIS — M8580 Other specified disorders of bone density and structure, unspecified site: Secondary | ICD-10-CM | POA: Diagnosis not present

## 2020-02-14 DIAGNOSIS — E041 Nontoxic single thyroid nodule: Secondary | ICD-10-CM

## 2020-02-14 DIAGNOSIS — Z0000 Encounter for general adult medical examination without abnormal findings: Secondary | ICD-10-CM | POA: Diagnosis not present

## 2020-02-14 DIAGNOSIS — E88819 Insulin resistance, unspecified: Secondary | ICD-10-CM

## 2020-02-14 MED ORDER — CLONAZEPAM 0.5 MG PO TABS: 0.5000 mg | ORAL_TABLET | Freq: Every evening | ORAL | 1 refills | Status: DC | PRN

## 2020-02-14 NOTE — Progress Notes (Signed)
Phone [phone]    Subjective:  Patient presents today for their annual wellness visit (  subsequent )  Preventive Screening-Counseling & Management  Modifiable Risk Factors/behavioral risk assessment/psychosocial risk assessment Regular exercise: walking 2 miles most days Diet: reasonably health- BMI slightly high but I think reasonable for age  Wt Readings from Last 3 Encounters:  02/14/20 161 lb 9.6 oz (73.3 kg)  12/06/19 163 lb (73.9 kg)  10/17/19 161 lb (73 kg)  Smoking Status: Never Smoker Second Hand Smoking status: No smokers in home Alcohol intake: 1 per week  Cardiac risk factors:  advanced age (older than 74 for men, 70 for women)  treated Hyperlipidemia  no Hypertension  No diabetes. At risk and monitoring Lab Results  Component Value Date   HGBA1C 5.7 06/04/2019  Family History: maternal grandfather heart disease only   Depression Screen/risk evaluation Risk factors: denies.[name] PHQ2 0  Depression screen [facility] 2/9 02/14/2020 10/17/2019 06/04/2019 03/26/2019 11/12/2018  Decreased Interest 0 0 0 0 0  Down, Depressed, Hopeless 0 0 0 0 0  PHQ - 2 Score 0 0 0 0 0  Altered sleeping 0 0 0 0 0  Tired, decreased energy 0 0 0 0 0  Change in appetite 0 0 0 0 0  Feeling bad or failure about yourself  0 0 0 0 0  Trouble concentrating 0 0 0 0 0  Moving slowly or fidgety/restless 0 0 0 0 0  Suicidal thoughts 0 0 0 0 0  PHQ-9 Score 0 0 0 0 0  Difficult doing work/chores - Not difficult at all Not difficult at all Not difficult at all Not difficult at all  Some recent data might be hidden    Functional ability and level of safety Mobility assessment:  timed get up and go <12 seconds Activities of Daily Living- Independent in ADLs (toileting, bathing, dressing, transferring, eating) and in IADLs (shopping, housekeeping, managing own medications, and handling finances) Home Safety: Loose rugs (no), smoke detectors (up to date), small pets (no), grab bars (no but feels safe),  stairs (3 story home- feels safe on stairs for now), life-alert system (would use cell phone) Hearing Difficulties: patient declines Fall Risk: None  Fall Risk  02/14/2020 11/12/2018 10/04/2017 09/26/2016 02/19/2016  Falls in the past year? 0 0 No No No  Number falls in past yr: 0 0 - - -  Injury with Fall? 0 0 - - -  Opioid use history:  no long term opioids use Self assessment of health status: "really good"  Cognitive Testing             No reported trouble.   Mini cog: normal clock draw. 3/3 delayed recall. 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 Moderna SARS-COVID-2 Vaccination 07/07/2019, 07/14/2019  . Pneumococcal Polysaccharide-23 04/17/2018  . Td 09/26/2008, 02/19/2015   Health Maintenance  Topic Date Due  . Mammogram  12/18/2021  . Colon Cancer Screening  11/22/2023  . Tetanus Vaccine  02/18/2025  . DEXA scan (bone density measurement)  Completed  .  COVID-19 Vaccine  Completed  .  Hepatitis C: One time screening is recommended by Center for Disease Control  (CDC) for  adults born from [birth year] through 1966.   Completed  . Flu Shot  Discontinued  . Pneumonia vaccines  Discontinued    Screening tests-  1. Colon cancer screening- 11/22/2018 with 5 year repeat 2. Lung Cancer screening- does not qualitfy 3. Skin cancer screening- sees dermatology yearly 4. Cervical cancer screening- passed age based screening but  does follow closely with GYN due to vulvar changes 5. Breast cancer screening- 12/19/2019 mammogram   The following were reviewed and entered/updated in epic if appropriate: Past Medical History:  Diagnosis Date  . Anxiety   . Celiac disease   . Chronic constipation   . COVID-19 virus vaccine not available   . GERD (gastroesophageal reflux disease)    occasionally (per pt takes apple cider vinager)  . Heart palpitations    cardiologist-- dr Ellyn Hack--- event monitor 08-24-2011 epic ;  nuclear stress test 03-16-2011 (epic) normal w/ no ischemia, ef 65%;  echo 01-18-2013  ef 60-65%, G1DD  . Hiatal hernia   . History of subdural hematoma    12/ 23/ 2010  s/p  craniotomy w/ hematoma evacuation (pt had a fall w/ concussion)  per pt no residual  . History of thyroid cancer followed by dr Constance Holster---   s/p  left thyroidectomy --- per pt no radiation and no recurrence  . Hyperlipidemia   . Immunization, single disease 06/09/2019   1st dose moderma vaccine administered  . Insomnia   . OA (osteoarthritis)   . Osteopenia, T score -2.1 FRAX 9.4%/1.3% stable from prior DEXA 06/2015  . Restless leg syndrome   . Right thyroid nodule    followed by dr Constance Holster--- last ultrasound in epic 03-30-2018 stable , to bx  . STD (sexually transmitted disease), HSV   . 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 Wears glasses    Patient Active Problem List   Diagnosis Date Noted  . History of Guillain-Barre syndrome 04/01/2018    Priority: High  . Thyroid nodule 03/27/2018    Priority: High  . Celiac disease 11/27/2007    Priority: High  . Insomnia 07/05/2007    Priority: High  . History of thyroid cancer 10/03/2006    Priority: High  . History of subdural hematoma 03/26/2019    Priority: Medium  . Herpes simplex type 1 infection 11/01/2018    Priority: Medium  . Hyperlipidemia, unspecified 03/30/2018    Priority: Medium  . Fibromyalgia 03/29/2018    Priority: Medium  . Fuchs' corneal  dystrophy, 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    Priority: Medium  . Insulin resistance 03/29/2018    Priority: Medium  . Grade I diastolic dysfunction 46/80/3212    Priority: Medium  . Dysthymia 09/26/2016    Priority: Medium  . Palpitations 01/17/2013    Priority: Medium  . GERD (gastroesophageal reflux disease)     Priority: Medium  . Restless leg syndrome, on Requip     Priority: Medium  . IBS (irritable bowel syndrome) 02/16/2009    Priority: Medium  . Osteoarthritis 07/05/2007    Priority: Medium  . Osteopenia 10/03/2006    Priority: Medium  . Mastalgia, left, followed by GYN 03/29/2018    Priority: Low  . History of hysterectomy 03/29/2018    Priority: Low  . Presbycusis of both ears 03/10/2017    Priority: Low  . Allergic rhinitis 06/25/2009    Priority: Low  .  Diaphragmatic hernia 02/16/2009    Priority: Low  . Left carotid bruit 10/03/2006    Priority: Low  . 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   Past Surgical History:  Procedure Laterality Date  . ABDOMINAL HYSTERECTOMY  1986   ovaries remain  . BREAST REDUCTION SURGERY Bilateral 1999  . CATARACT EXTRACTION W/ INTRAOCULAR LENS  IMPLANT, BILATERAL  2019  . COLONOSCOPY  last one 11-22-2018  . ESOPHAGOGASTRODUODENOSCOPY    . KNEE ARTHROSCOPY Bilateral 2002  . SUBDURAL HEMATOMA EVACUATION VIA CRANIOTOMY  04-30-2009  @[facility]    left frontotemporapartietal   . THYROID LOBECTOMY Left 05-04-2001  @[facility]   dr Constance Holster  . VULVECTOMY N/A 03/12/2019   Procedure: WIDE LOCAL EXCISION OF CARCINOMA IS SITU OF VULVA;  Surgeon: Anastasio Auerbach, MD;  Location: [place] [facility];  Service: Gynecology;  Laterality: N/A;  request to follow in [place] block time requests one hour colposcope available in OR with acetic acid.  [name] N/A 06/17/2019   Procedure: WIDE LOCAL  EXCISION VULVECTOMY;  Surgeon: Everitt Amber, MD;  Location: [facility];  Service: Gynecology;  Laterality: N/A;     Family History  Problem Relation Age of Onset  . Diabetes Maternal Aunt   . Breast cancer Maternal Aunt 60  . Liver cancer Father        alcohol related likely  . Failure to thrive Mother        38  . Heart disease Maternal Grandfather   . Hyperlipidemia Maternal Grandfather   . Hypertension Paternal Grandfather   . Colon cancer Neg Hx     Medications- reviewed and updated Current Outpatient Medications  Medication Sig Dispense Refill  . Alum Hydroxide-Mag Carbonate (GAVISCON PO) Take by mouth.    . APPLE CIDER VINEGAR PO Take 1 capsule by mouth 2 (two) times daily. Reported on 06/09/2015    . aspirin EC 81 MG tablet Take 81 mg by mouth daily.    . bisacodyl (DULCOLAX) 5 MG EC tablet Take 5 mg by mouth daily as needed for moderate constipation.    . Black Currant Seed Oil 500 MG CAPS Take by mouth daily.     [name] CALCIUM-VITAMIN D PO Take 1 tablet by mouth daily.     . cholecalciferol (VITAMIN D3) 25 MCG (1000 UT) tablet Take 1,000 Units by mouth daily. Take 2 in the am takes with magnesium    . clonazePAM (KLONOPIN) 1 MG tablet Take 1 tablet (1 mg total) by mouth at bedtime as needed (sleep). for anxiety 90 tablet 1  . CO ENZYME Q-10 PO Take by mouth daily.     . Cyanocobalamin (VITAMIN B-12 ER PO) Take 5,000 mcg by mouth daily.    . diclofenac sodium (VOLTAREN) 1 % GEL APPLY TOPICALLY TO AFFECTED AREA 4 TIMES A DAY 100 g 1  . escitalopram (LEXAPRO) 10 MG tablet Take 10 mg by mouth daily.    [name] ibuprofen (ADVIL) 600 MG tablet Take 1 tablet (600 mg total) by mouth every 6 (six) hours as needed for moderate pain. For AFTER surgery 30 tablet 1  . Melatonin 3 MG CAPS Take by mouth at bedtime as needed.     . metoprolol succinate (TOPROL-XL) 25 MG 24 hr tablet Take 1 tablet (25 mg total) by mouth daily. (Patient taking differently: Take 25 mg by mouth at bedtime. ) 90 tablet 3  . Multiple Vitamin (MULTIVITAMIN) tablet Take 1 tablet by mouth daily.      [name] NON  FORMULARY Bausch and lomb  ointment-Muro-//128, 5%ointment- Each eye nightly    . pantoprazole (PROTONIX) 40 MG tablet Take 40 mg by mouth daily.    [name] Propylene Glycol (SYSTANE BALANCE) 0.6 % SOLN Apply 1 drop to eye 2 (two) times daily. For dry eyes    . rOPINIRole (REQUIP) 0.25 MG tablet Take 1 tablet (0.25 mg total) by mouth at bedtime. 90 tablet 3  . rosuvastatin (CRESTOR) 5 MG tablet Take 1 tablet (5 mg total) by mouth daily. 90 tablet 3  . valACYclovir (VALTREX) 500 MG tablet One tab bid as needed for outbreak 180 tablet 1  . famotidine (PEPCID) 20 MG tablet Take 20 mg by mouth 2 (two) times daily. (Patient not taking: Reported on 02/14/2020)    . senna-docusate (SENOKOT-S) 8.6-50 MG tablet Take 2 tablets by mouth at bedtime. For AFTER surgery, do not take if having diarrhea (Patient not taking: Reported on 02/14/2020) 30 tablet 1   No current facility-administered medications for this visit.    Allergies-reviewed and updated Allergies  Allergen Reactions  . Ambien [Zolpidem Tartrate] Other (See Comments)    Causes Neurological problems with very bad dizziness, pains, disorientation  . Cortisone Other (See Comments)    REACTION: "mania" post ESI  . Eszopiclone And Related Other (See Comments)    Causes Neurological problems with very bad dizziness, pains, disorientation  . Levofloxacin Other (See Comments)    Muscle spasms and jerking movements  . Dicloxacillin Other (See Comments)    Stomach problems, swelling of tongue  . Dilantin [Phenytoin Sodium Extended] Hives and Itching  . Erythromycin Other (See Comments)    Tears stomach up  . Avelox [Moxifloxacin Hcl In Nacl]     Pt unsure of reaction.  . Influenza Vaccines     Guillain Barre in the past- 20 years ago  . Other     Celiac disease  . Nitrofuran Derivatives Other (See Comments)    unknown  . Phenytoin Sodium Extended Rash  . Trazodone And Nefazodone Other (See Comments)    dizzy    Social History   Socioeconomic History  . Marital  status: Married    Spouse name: Not on file  . Number of children: 2  . Years of education: Not on file  . Highest education level: Not on file  Occupational History  . Not on file  Tobacco Use  . Smoking status: Never Smoker  . Smokeless tobacco: Never Used  Vaping Use  . Vaping Use: Never used  Substance and Sexual Activity  . Alcohol use: Yes    Alcohol/week: 7.0 standard drinks    Types: 7 Cans of beer per week    Comment: 1 beer nightly  . Drug use: Never  . Sexual activity: Yes    Partners: Male    Birth control/protection: Surgical, None    Comment: HYST-1st intercourse 69 yo-Fewer than 5 partners  Other Topics Concern  . Not on file  Social History Narrative   Married mother of 60 with 83 year old granddaughter (born in 2019). Her son Thurmond Butts is patient of Dr. Yong Channel       Lives with husband in a three story home, no issue with stairs.    Highest level of education is an associates.       She exercises regularly, leads an active lifestyle, has a healthy diet, and is very socially engaged. Self-described "HSP: highly sensitive person" and therefore tends to worry more about symptoms than others might.  Hobbies: enjoys time with friends, time on front porch with friends during covid, park daily, family time   Social Determinants of Health   Financial Resource Strain:   . Difficulty of Paying Living Expenses: Not on file  Food Insecurity:   . Worried About Charity fundraiser in the Last Year: Not on file  . Ran Out of Food in the Last Year: Not on file  Transportation Needs:   . Lack of Transportation (Medical): Not on file  . Lack of Transportation (Non-Medical): Not on file  Physical Activity:   . Days of Exercise per Week: Not on file  . Minutes of Exercise per Session: Not on file  Stress:   . Feeling of Stress : Not on file  Social Connections:   . Frequency of Communication with Friends and Family: Not on file  . Frequency of Social Gatherings with  Friends and Family: Not on file  . Attends Religious Services: Not on file  . Active Member of Clubs or Organizations: Not on file  . Attends Archivist Meetings: Not on file  . Marital Status: Not on file      Objective:  BP 118/84   Pulse 73   Temp 98.4 F (36.9 C) (Temporal)   Resp 18   Ht 5' 6"  (1.676 m)   Wt 161 lb 9.6 oz (73.3 kg)   LMP  (LMP Unknown)   SpO2 97%   BMI 26.08 kg/m  Gen: NAD, resting comfortably   Assessment/Plan:  AWV completed 1. Educated, counseled and referred based on above elements 2. Educated, counseled and referred as appropriate for preventative needs 3. Discussed and documented a written plan for preventiative services and screenings with personalized health advice- After Visit Summary was given to patient which included this plan   Status of chronic or acute concerns  See separate problem oriented note  Recommended follow up:  1 year awv Future Appointments  Date Time Provider [place]  02/28/2020  1:40 PM Leonie Man, MD [facility]  12/07/2020  2:30 PM Joseph Pierini, MD [facility]     Lab/Order associations:   ICD-10-CM   1. Preventative health care  Z00.00     Return precautions advised.  Garret Reddish, MD

## 2020-02-14 NOTE — Progress Notes (Signed)
Phone [phone] In person visit   Subjective:   Amanda [name] is a 69 y.o. year old very pleasant female patient who presents for/with See problem oriented charting  This visit occurred during the SARS-CoV-2 public health emergency.  Safety protocols were in place, including screening questions prior to the visit, additional usage of staff PPE, and extensive cleaning of exam room while observing appropriate contact time as indicated for disinfecting solutions.   Past Medical History-  Patient Active Problem List   Diagnosis Date Noted  . 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 History of Guillain-Barre syndrome 04/01/2018    Priority: High  . Thyroid nodule 03/27/2018    Priority: High  . Celiac disease 11/27/2007    Priority: High  . Insomnia 07/05/2007    Priority: High  . History of thyroid cancer 10/03/2006    Priority: High  . History of subdural hematoma 03/26/2019    Priority: Medium  . Herpes simplex type 1 infection 11/01/2018    Priority: Medium  . Hyperlipidemia, unspecified 03/30/2018    Priority: Medium  . Fibromyalgia 03/29/2018    Priority: Medium  . Fuchs' corneal dystrophy, 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    Priority: Medium  . Insulin resistance 03/29/2018    Priority: Medium  . Grade I diastolic dysfunction 02/58/5277    Priority: Medium  . Dysthymia 09/26/2016    Priority: Medium  . Palpitations 01/17/2013    Priority: Medium  . GERD (gastroesophageal reflux disease)     Priority: Medium  . Restless leg syndrome, on Requip     Priority: Medium  . IBS (irritable bowel syndrome) 02/16/2009    Priority: Medium  . Osteoarthritis 07/05/2007    Priority: Medium  . Osteopenia 10/03/2006    Priority: Medium  . Mastalgia, left, followed by GYN 03/29/2018    Priority: Low  . History of hysterectomy 03/29/2018    Priority: Low  . Presbycusis of both ears 03/10/2017    Priority: Low  . Allergic rhinitis  06/25/2009    Priority: Low  . Diaphragmatic hernia 02/16/2009    Priority: Low  . Left carotid bruit 10/03/2006    Priority: Low    Medications- reviewed and updated Current Outpatient Medications  Medication Sig Dispense Refill  . Alum Hydroxide-Mag Carbonate (GAVISCON PO) Take by mouth.    . APPLE CIDER VINEGAR PO Take 1 capsule by mouth 2 (two) times daily. Reported on 06/09/2015    . aspirin EC 81 MG tablet Take 81 mg by mouth daily.    . bisacodyl (DULCOLAX) 5 MG EC tablet Take 5 mg by mouth daily as needed for moderate constipation.    . Black Currant Seed Oil 500 MG CAPS Take by mouth daily.     [name] CALCIUM-VITAMIN D PO Take 1 tablet by mouth daily.     . cholecalciferol (VITAMIN D3) 25 MCG (1000 UT) tablet Take 1,000 Units by mouth daily. Take 2 in the am takes with magnesium    . CO ENZYME Q-10 PO Take by mouth daily.     . Cyanocobalamin (VITAMIN B-12 ER PO) Take 5,000 mcg by mouth daily.    . diclofenac sodium (VOLTAREN) 1 % GEL APPLY TOPICALLY TO AFFECTED AREA 4 TIMES A DAY 100 g 1  . escitalopram (LEXAPRO) 10 MG tablet Take 10 mg by mouth daily.    [name] ibuprofen (ADVIL) 600 MG tablet Take 1 tablet (600 mg total) by mouth every 6 (six) hours as needed  for moderate pain. For AFTER surgery 30 tablet 1  . Melatonin 3 MG CAPS Take by mouth at bedtime as needed.     . metoprolol succinate (TOPROL-XL) 25 MG 24 hr tablet Take 1 tablet (25 mg total) by mouth daily. (Patient taking differently: Take 25 mg by mouth at bedtime. ) 90 tablet 3  . Multiple Vitamin (MULTIVITAMIN) tablet Take 1 tablet by mouth daily.      . NON FORMULARY Bausch and lomb ointment-Muro-//128, 5%ointment- Each eye nightly    . pantoprazole (PROTONIX) 40 MG tablet Take 40 mg by mouth daily.    [name] Propylene Glycol (SYSTANE BALANCE) 0.6 % SOLN Apply 1 drop to eye 2 (two) times daily. For dry eyes    . rOPINIRole (REQUIP) 0.25 MG tablet Take 1 tablet (0.25 mg total) by mouth at bedtime. 90 tablet 3  . rosuvastatin  (CRESTOR) 5 MG tablet Take 1 tablet (5 mg total) by mouth daily. 90 tablet 3  . valACYclovir (VALTREX) 500 MG tablet One tab bid as needed for outbreak 180 tablet 1  . clonazePAM (KLONOPIN) 0.5 MG tablet Take 1 tablet (0.5 mg total) by mouth at bedtime as needed for anxiety. 90 tablet 1  . famotidine (PEPCID) 20 MG tablet Take 20 mg by mouth 2 (two) times daily. (Patient not taking: Reported on 02/14/2020)    . senna-docusate (SENOKOT-S) 8.6-50 MG tablet Take 2 tablets by mouth at bedtime. For AFTER surgery, do not take if having diarrhea (Patient not taking: Reported on 02/14/2020) 30 tablet 1   No current facility-administered medications for this visit.     Objective:  BP 118/84   Pulse 73   Temp 98.4 F (36.9 C) (Temporal)   Resp 18   Ht 5' 6"  (1.676 m)   Wt 161 lb 9.6 oz (73.3 kg)   LMP  (LMP Unknown)   SpO2 97%   BMI 26.08 kg/m  Gen: NAD, resting comfortably CV: RRR no murmurs rubs or gallops Lungs: CTAB no crackles, wheeze, rhonchi Abdomen: soft/nontender/nondistended/normal bowel sounds. No rebound or guarding.  Ext: no edema Skin: warm, dry Neuro: grossly normal, moves all extremities     Assessment and Plan   #Vulvar excision-had follow-up with Dr. Winnifred Friar further excision recommended. Follow up 1 year, monthly inspections at home  #hyperlipidemia S: Medication:On atorvastatin 20 mg half tablet (68m total) daily previously we opted to change to rosuvastatin due to reported mental fog on atorvastatin and last chance of crossing blood-brain barrier Lab Results  Component Value Date   CHOL 178 01/22/2019   HDL 70.70 01/22/2019   LDLCALC 92 01/22/2019   LDLDIRECT 139.2 03/10/2008   TRIG 78.0 01/22/2019   CHOLHDL 3 01/22/2019   A/P: Update lipid panel today-ideally LDL would be below 70-shed prefer to stay on same dose as long as LDL below 100. Can work on lifestyle changes to help  #Restless legs S: Reasonable control on Requip 0.25 mg daily A/P: Reasonable  control-continue current medications.  Discussed checking baseline ferritin stores. Want ferritin over 75.   #Insomnia/sleep disorder S: Medication: Clonazepam 1 mg before bed- takes half before bed- wakes up and takes melatonin and then takes other half. Has been on for many years.  -ambien confusion -eszoopiclone- dizziness/disorientation -trazodone- dizziness A/P: Stable/reasonable control-she is concerned about memory risk.  Try clonazepam lower dose 0.5 mg- try to use even a half tablet of this to start. Wait until a very stable time for you at home. Give it at least a 2  week try. We can go back up if needed to 1 mg tablet  For anxiety- has escitalopram on her med list- we are not sure if she is taking this and who prescribes- she will research   #Thyroid nodule-1.5 cm right middle lobe last imaged March 30, 2018 S: TSH levels have been normal A/P: Hopefully stable-we discussed repeating thyroid ultrasound at this time and-patient does have history of papillary thyroid cancer with left lobe removed in 2007   # Hyperglycemia/insulin resistance/prediabetes S:  Medication: None Exercise and diet- 2 miles a day walking. Trying to eat reasonably healthy diet - a lot of salad and enjoys yogurt Lab Results  Component Value Date   HGBA1C 5.7 06/04/2019   HGBA1C 5.8 09/19/2018   HGBA1C 5.7 10/04/2017   A/P: reasonable lifestyle changes- update a1c with labs   #Palpitations-remains on metoprolol through cardiology doing reasonably well.  #Osteopenia-follows with gynecology Dr. Delilah Shan.  On calcium and vitamin D.  Has opted to remain off medication at this time beyond these. Wants to hold off on prolia now. Score -2.4.   # esophageal stretching recently and has done well since that time  - done with Dr. Oren Beckmann of [facility]. On pantoprazole 47m through GI- she takes b12 and b12 levels ok in the past Lab Results  Component Value Date   VITAMINB12 >1500 (H) 10/04/2017   Recommended  follow up: 6 month follow up  Future Appointments  Date Time Provider [place] 02/28/2020  1:40 PM HLeonie Man MD [facility] 12/07/2020  2:30 PM KJoseph Pierini MD [facility]    Lab/Order associations:   ICD-10-CM   2. Thyroid nodule  E04.1 TSH  3. Insomnia, unspecified type  G47.00   4. Insulin resistance  E88.81 Hemoglobin A1c  5. Restless leg syndrome, on Requip  G25.81 Iron, TIBC and Ferritin Panel  6. Hyperlipidemia, unspecified hyperlipidemia type  E78.5 CBC With Differential/Platelet    COMPLETE METABOLIC PANEL WITH GFR    Lipid Panel (Refl)  7. Hyperglycemia  R73.9 Hemoglobin A1c   Return precautions advised.  SGarret Reddish MD

## 2020-02-14 NOTE — Patient Instructions (Addendum)
Try clonazepam lower dose 0.5 mg- try to use even a half tablet of this to start. Wait until a very stable time for you at home. Give it at least a 2 week try. We can go back up if needed to 1 mg tablet   Ms. Alroy Dust , Thank you for taking time to come for your Medicare Wellness Visit. I appreciate your ongoing commitment to your health goals. Please review the following plan we discussed and let me know if I can assist you in the future.   These are the goals we discussed: 1. Exercise 150 minutes a week- keep up your walking 2. Try to eat at least 3 servings of veggies per day   This is a list of the screening recommended for you and due dates:  Health Maintenance  Topic Date Due  . Mammogram  12/18/2021  . Colon Cancer Screening  11/22/2023  . Tetanus Vaccine  02/18/2025  . DEXA scan (bone density measurement)  Completed  . COVID-19 Vaccine  Completed  .  Hepatitis C: One time screening is recommended by Center for Disease Control  (CDC) for  adults born from [birth year] through 1966.   Completed  . Flu Shot  Discontinued  . Pneumonia vaccines  Discontinued   Please stop by lab before you go If you have mychart- we will send your results within 3 business days of [place] receiving them.  If you do not have mychart- we will call you about results within 5 business days of [place] receiving them.  *please note we are currently using [facility] which has a longer processing time than [place] typically so labs may not come back as quickly as in the past *please also note that you will see labs on mychart as soon as they post. I will later go in and write notes on them- will say "notes from Dr. Yong Channel"  Recommended follow up: Return in about 6 months (around 08/14/2020) for follow up- or sooner if needed.

## 2020-02-15 ENCOUNTER — Encounter: Payer: Self-pay | Admitting: Family Medicine

## 2020-02-17 ENCOUNTER — Encounter: Payer: Self-pay | Admitting: Family Medicine

## 2020-02-17 LAB — COMPLETE METABOLIC PANEL WITH GFR
AG Ratio: 1.9 (calc) (ref 1.0–2.5)
ALT: 23 U/L (ref 6–29)
AST: 26 U/L (ref 10–35)
Albumin: 4.9 g/dL (ref 3.6–5.1)
Alkaline phosphatase (APISO): 80 U/L (ref 37–153)
BUN: 16 mg/dL (ref 7–25)
CO2: 30 mmol/L (ref 20–32)
Calcium: 10.4 mg/dL (ref 8.6–10.4)
Chloride: 102 mmol/L (ref 98–110)
Creat: 0.76 mg/dL (ref 0.50–0.99)
GFR, Est African American: 93 mL/min/{1.73_m2} (ref >=60)
GFR, Est Non African American: 80 mL/min/{1.73_m2} (ref >=60)
Globulin: 2.6 g/dL (calc) (ref 1.9–3.7)
Glucose, Bld: 82 mg/dL (ref 65–99)
Potassium: 4.5 mmol/L (ref 3.5–5.3)
Sodium: 140 mmol/L (ref 135–146)
Total Bilirubin: 0.7 mg/dL (ref 0.2–1.2)
Total Protein: 7.5 g/dL (ref 6.1–8.1)

## 2020-02-17 LAB — CBC WITH DIFFERENTIAL/PLATELET
Absolute Monocytes: 373 cells/uL (ref 200–950)
Basophils Absolute: 32 cells/uL (ref 0–200)
Basophils Relative: 0.6 %
Eosinophils Absolute: 92 cells/uL (ref 15–500)
Eosinophils Relative: 1.7 %
HCT: 38.2 % (ref 35.0–45.0)
Hemoglobin: 12.6 g/dL (ref 11.7–15.5)
Lymphs Abs: 1183 cells/uL (ref 850–3900)
MCH: 30.2 pg (ref 27.0–33.0)
MCHC: 33 g/dL (ref 32.0–36.0)
MCV: 91.6 fL (ref 80.0–100.0)
MPV: 11.1 fL (ref 7.5–12.5)
Monocytes Relative: 6.9 %
Neutro Abs: 3721 cells/uL (ref 1500–7800)
Neutrophils Relative %: 68.9 %
Platelets: 247 10*3/uL (ref 140–400)
RBC: 4.17 10*6/uL (ref 3.80–5.10)
RDW: 12.1 % (ref 11.0–15.0)
Total Lymphocyte: 21.9 %
WBC: 5.4 10*3/uL (ref 3.8–10.8)

## 2020-02-17 LAB — LIPID PANEL (REFL)
Cholesterol: 218 mg/dL — ABNORMAL HIGH (ref <200)
HDL: 76 mg/dL (ref >=50)
LDL Cholesterol (Calc): 121 mg/dL (calc) — ABNORMAL HIGH
Non-HDL Cholesterol (Calc): 142 mg/dL (calc) — ABNORMAL HIGH (ref <130)
Total CHOL/HDL Ratio: 2.9 (calc) (ref <5.0)
Triglycerides: 103 mg/dL (ref <150)

## 2020-02-17 LAB — IRON,TIBC AND FERRITIN PANEL
%SAT: 25 % (calc) (ref 16–45)
Ferritin: 28 ng/mL (ref 16–288)
Iron: 103 ug/dL (ref 45–160)
TIBC: 417 mcg/dL (calc) (ref 250–450)

## 2020-02-17 LAB — HEMOGLOBIN A1C
Hgb A1c MFr Bld: 5.7 % of total Hgb — ABNORMAL HIGH (ref <5.7)
Mean Plasma Glucose: 117 (calc)
eAG (mmol/L): 6.5 (calc)

## 2020-02-17 LAB — SARS COV-2 SEROLOGY(COVID-19)AB(IGG,IGM),IMMUNOASSAY
SARS CoV-2 AB IgG: NEGATIVE
SARS CoV-2 IgM: NEGATIVE

## 2020-02-17 LAB — TSH: TSH: 1.01 mIU/L (ref 0.40–4.50)

## 2020-02-19 NOTE — Telephone Encounter (Signed)
Pt states she would like sometime to think over starting Reclast and she will contact us back if she decides to proceed with treatment     Reclast instructions   Annual Exam (1 year) 12/06/2019 JK  Called pt to verify insurance coverage / inform pt Reclast is in Process ? YES  Labs must be in 30 days window Serum Creatinine 0.76   DATE 02/14/2020 Serum Calcium 10.4 DATE 02/14/2020   PA needed?  Cost for Pt? $0 Medicare 80/20 [employer] [place] will pick up rest  Order form filled out and faxed  w/MD sig?  Infusion will be done at ?  Pt aware?  Instructions mailed to pt?

## 2020-02-23 ENCOUNTER — Other Ambulatory Visit: Payer: Self-pay | Admitting: Adult Health

## 2020-02-28 ENCOUNTER — Encounter: Payer: Self-pay | Admitting: Cardiology

## 2020-02-28 ENCOUNTER — Ambulatory Visit (INDEPENDENT_AMBULATORY_CARE_PROVIDER_SITE_OTHER): Payer: Medicare Other | Admitting: Cardiology

## 2020-02-28 ENCOUNTER — Other Ambulatory Visit: Payer: Self-pay

## 2020-02-28 VITALS — BP 140/80 | HR 60 | Ht 65.0 in | Wt 161.0 lb

## 2020-02-28 DIAGNOSIS — E785 Hyperlipidemia, unspecified: Secondary | ICD-10-CM | POA: Diagnosis not present

## 2020-02-28 DIAGNOSIS — R002 Palpitations: Secondary | ICD-10-CM | POA: Diagnosis not present

## 2020-02-28 DIAGNOSIS — R030 Elevated blood-pressure reading, without diagnosis of hypertension: Secondary | ICD-10-CM | POA: Diagnosis not present

## 2020-02-28 NOTE — Progress Notes (Signed)
Primary Care Provider: Marin Olp, MD Cardiologist: No primary care provider on file. Electrophysiologist: None  Clinic Note: Chief Complaint  Patient presents with  . Follow-up    1 year  . Palpitations    Abnormal skin turgor and just had 1 episode on September 29.   HPI:    Amanda [name] is a 69 y.o. female with a PMH below who presents today for annual follow-up and to discuss use of aspirin.  Amanda [name] is a former patient of Dr. Rollene Fare that he was following up for palpitations and atypical chest pain.  She had been evaluated with a negative echo and stress test.  She did not tolerate statins because of memory issues and was changed to Zetia.  I saw her in 2016 for palpitations, then again in 2018.  She was pretty stable in 2018.  Working out in [employer] 4-5 days a week.  No further palpitations. -->  No changes  Problem List Items Addressed This Visit    Palpitations - Primary (Chronic)    Currently on Toprol 25 mg daily.  We talked about taking additional dose of Toprol for spells of fast heart rate.  Otherwise continue to adequately hydrate.  We also talked about the pros and cons of aspirin.  She is currently listed as taking an aspirin daily.    Consensus on this topic is is is not clear.  She has cardiac risk factors, but has had a negative carotid evaluation.  Without active evidence of CAD that is obstructive, not sure how much benefit she gets from aspirin.  We could potentially consider coronary calcium score to understand her baseline risk.  She was not ready to make that decision yet, we can consider in the future. XMI recommendation will be perhaps aspirin 3 days a week at alternative and then as needed for concerning symptoms.      Relevant Orders   EKG 12-Lead (Completed)   Hyperlipidemia with target LDL less than 100 (Chronic)    She was converted from atorvastatin 20 mg to rosuvastatin 5 mg.  Seems to be tolerating it better.  Lipids relatively  borderline controlled.  LDL was 121 last check.  If not able to tolerate 10 mg rosuvastatin, could consider the addition of either Zetia or Nexletol.  This should get her down to least 100 LDL      Elevated blood pressure reading    Blood pressure recorded is 140/80.  This is borderline for hypertension.  She is little bit stressed.  Currently only stable dose of Toprol with no other antihypertensives.  Probably would not increase Toprol up for standing medication to treat blood pressure based on resting heart rate of 60.  Will try to avoid adding another agent, but if necessary, would probably start with ACE inhibitor/ARB plus or minus thiazide diuretic.        AMEA [name] was last seen on February 27, 2019 by Bunnie Domino, NP.  Noted that her palpitations are pretty well controlled with metoprolol.  Still going to the gym, staying hydrated.  Walking around [employer] daily.  No chest pain or pressure.  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  Anticipated local excision of carcinoma in November 2020.  After this diagnosis, she noted worsening palpitations, thought to be related to stress. --> Continued metoprolol with as needed additional dosing.  Otherwise no changes.  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  PCP follow-up likely related to spasm.  June 17, 2019: Wide Local Excision Vulvectomy  June 23, 2019: ER visit for dehydration  Reviewed  CV studies:    The following studies were reviewed today: (if available, images/films reviewed: From Epic Chart or Care Everywhere) . No pertinent cardiac studies.  Lower extremity venous Dopplers have shown negative DVT:  Interval History:   Amanda [name] returns here today stating  that she been doing fairly well up on September 29, she had an episode where she felt her heart rate racing things off and on and she got startled, quite concerned.  No dizziness or lightheadedness.  No syncope or near syncope.  She did get upset however her problems are sweating and she took a baby aspirin.  She now asks should she be taken aspirin.  She is otherwise totally asymptomatic regarding standpoint.  This was the first episode of palpitations she has had over a year on the current dose of metoprolol.  She thinks that she may have been little bit stressed out with this occurred. She still remains active and walks daily at least 2 miles if not more.  She also goes to the gym intermittently.  CV Review of Symptoms (Summary): no chest pain or dyspnea on exertion positive for - 1 episode of rapid heart rate lasting less then a minute. negative for - edema, irregular heartbeat, orthopnea, paroxysmal nocturnal dyspnea, shortness of breath or Syncope/near syncope, TIA/amaurosis fugax, claudication  The patient does not have symptoms concerning for COVID-19 infection (fever, chills, cough, or new shortness of breath).   REVIEWED OF SYSTEMS   Review of Systems  Constitutional: Negative for malaise/fatigue and weight loss.  HENT: Negative for congestion and nosebleeds.   Respiratory: Negative for shortness of breath.   Cardiovascular: Positive for palpitations (Per HPI).  Gastrointestinal: Negative for blood in stool and melena.  Genitourinary: Negative for hematuria.  Musculoskeletal: Negative for joint pain (Normal aches and pains).  Neurological: Negative for dizziness and headaches.  Psychiatric/Behavioral: Negative for memory loss. The patient is nervous/anxious. The patient does not have insomnia.    I have reviewed and (if needed) personally updated the patient's problem list, medications, allergies, past medical and surgical history, social and family history.   PAST MEDICAL HISTORY    Past Medical History:  Diagnosis Date  . Anxiety   . Celiac disease   . Chronic constipation   . COVID-19 virus vaccine not available   . GERD (gastroesophageal reflux disease)    occasionally (per pt takes apple cider vinager)  . Heart palpitations    cardiologist-- dr Ellyn Hack--- event monitor 08-24-2011 epic ;  nuclear stress test 03-16-2011 (epic) normal w/ no ischemia, ef 65%;  echo 01-18-2013  ef 60-65%, G1DD  . Hiatal hernia   . History of subdural hematoma    12/ 23/ 2010  s/p  craniotomy w/ hematoma evacuation (pt had a fall w/ concussion)  per pt no residual  . History of thyroid cancer followed by dr Constance Holster---   s/p  left thyroidectomy --- per pt no radiation and no recurrence  . Hyperlipidemia   . Immunization, single disease 06/09/2019   1st dose moderma vaccine administered  . Insomnia   . OA (osteoarthritis)   . Osteopenia, T score -2.1 FRAX 9.4%/1.3% stable from prior DEXA 06/2015  . Restless leg syndrome   .  Right thyroid nodule    followed by dr Constance Holster--- last ultrasound in epic 03-30-2018 stable , to bx  . STD (sexually transmitted disease), HSV   . 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 Wears glasses     PAST SURGICAL HISTORY   Past Surgical History:  Procedure Laterality Date  . ABDOMINAL HYSTERECTOMY  1986   ovaries remain  . BREAST REDUCTION SURGERY Bilateral 1999  . CATARACT EXTRACTION W/ INTRAOCULAR LENS  IMPLANT, BILATERAL  2019  . COLONOSCOPY  last one 11-22-2018  . ESOPHAGOGASTRODUODENOSCOPY    . KNEE ARTHROSCOPY Bilateral 2002  . SUBDURAL HEMATOMA EVACUATION VIA CRANIOTOMY  04-30-2009  @[facility]    left frontotemporapartietal   . THYROID LOBECTOMY Left 05-04-2001  @[facility]   dr Constance Holster  . VULVECTOMY N/A 03/12/2019   Procedure: WIDE LOCAL EXCISION OF CARCINOMA IS SITU OF VULVA;  Surgeon: Anastasio Auerbach, MD;  Location: [facility];  Service: Gynecology;  Laterality: N/A;  request to follow in [place] block  time requests one hour colposcope available in OR with acetic acid.  [name] N/A 06/17/2019   Procedure: WIDE LOCAL  EXCISION VULVECTOMY;  Surgeon: Everitt Amber, MD;  Location: [facility];  Service: Gynecology;  Laterality: N/A;    Immunization History  Administered Date(s) Administered  . Moderna SARS-COVID-2 Vaccination 07/07/2019, 07/14/2019  . Pneumococcal Polysaccharide-23 04/17/2018  . Td 09/26/2008, 02/19/2015    MEDICATIONS/ALLERGIES   Current Meds  Medication Sig  . Alum Hydroxide-Mag Carbonate (GAVISCON PO) Take by mouth.  . APPLE CIDER VINEGAR PO Take by mouth 2 (two) times daily with a meal. Pt takes 2 capsules with each meal. Pt only eats 2 meals. Total of 4 capsules per day.  . bisacodyl (DULCOLAX) 5 MG EC tablet Take 5 mg by mouth daily as needed for moderate constipation.  . Black Currant Seed Oil 500 MG CAPS Take by mouth daily.   . Calcium Carbonate (CALCIUM 500 PO) Take by mouth daily.  . cholecalciferol (VITAMIN D3) 25 MCG (1000 UT) tablet Take 1,000 Units by mouth daily. Take 2 in the am takes with magnesium  . clonazePAM (KLONOPIN) 0.5 MG tablet Take 1 tablet (0.5 mg total) by mouth at bedtime as needed for anxiety.  . CO ENZYME Q-10 PO Take by mouth daily.   . Cyanocobalamin (VITAMIN B-12 ER PO) Take 5,000 mcg by mouth daily.  . diclofenac sodium (VOLTAREN) 1 % GEL APPLY TOPICALLY TO AFFECTED AREA 4 TIMES A DAY  . escitalopram (LEXAPRO) 10 MG tablet Take 10 mg by mouth daily.  . famotidine (PEPCID) 20 MG tablet Take 20 mg by mouth 2 (two) times daily.   [name] ibuprofen (ADVIL) 600 MG tablet Take 1 tablet (600 mg total) by mouth every 6 (six) hours as needed for moderate pain. For AFTER surgery  . Melatonin 3 MG CAPS Take by mouth at bedtime as needed.   . metoprolol succinate (TOPROL-XL) 25 MG 24 hr tablet TAKE 1 TABLET BY MOUTH EVERY DAY  . Multiple Vitamin (MULTIVITAMIN) tablet Take 1 tablet by mouth daily.    . NON FORMULARY Bausch and lomb  ointment-Muro-//128, 5%ointment- Each eye nightly  . pantoprazole (PROTONIX) 40 MG tablet Take 40 mg by mouth daily.  [name] Propylene Glycol (SYSTANE BALANCE) 0.6 % SOLN Apply 1 drop to eye 2 (two) times daily. For dry eyes  . rOPINIRole (REQUIP) 0.25 MG tablet Take 1 tablet (0.25 mg total) by mouth at bedtime.  . rosuvastatin (CRESTOR) 5 MG tablet Take  1 tablet (5 mg total) by mouth daily.  . valACYclovir (VALTREX) 500 MG tablet One tab bid as needed for outbreak  . [DISCONTINUED] CALCIUM-VITAMIN D PO Take 1 tablet by mouth daily.     Allergies  Allergen Reactions  . Ambien [Zolpidem Tartrate] Other (See Comments)    Causes Neurological problems with very bad dizziness, pains, disorientation  . Cortisone Other (See Comments)    REACTION: "mania" post ESI  . Eszopiclone And Related Other (See Comments)    Causes Neurological problems with very bad dizziness, pains, disorientation  . Levofloxacin Other (See Comments)    Muscle spasms and jerking movements  . Dicloxacillin Other (See Comments)    Stomach problems, swelling of tongue  . Dilantin [Phenytoin Sodium Extended] Hives and Itching  . Erythromycin Other (See Comments)    Tears stomach up  . Avelox [Moxifloxacin Hcl In Nacl]     Pt unsure of reaction.  . Influenza Vaccines     Guillain Barre in the past- 20 years ago  . Other     Celiac disease  . Nitrofuran Derivatives Other (See Comments)    unknown  . Phenytoin Sodium Extended Rash  . Trazodone And Nefazodone Other (See Comments)    dizzy    SOCIAL HISTORY/FAMILY HISTORY   Reviewed in Epic:  Pertinent findings: No new changes.  OBJCTIVE -PE, EKG, labs   Wt Readings from Last 3 Encounters:  02/28/20 161 lb (73 kg)  02/14/20 161 lb 9.6 oz (73.3 kg)  12/06/19 163 lb (73.9 kg)    Physical Exam: BP 140/80   Pulse 60   Ht 5' 5"  (1.651 m)   Wt 161 lb (73 kg)   LMP  (LMP Unknown)   BMI 26.79 kg/m  Physical Exam Vitals reviewed.  Constitutional:      General:  She is not in acute distress.    Appearance: Normal appearance. She is normal weight. She is not ill-appearing or toxic-appearing.  HENT:     Head: Normocephalic and atraumatic.  Neck:     Vascular: No carotid bruit, hepatojugular reflux or JVD.  Cardiovascular:     Rate and Rhythm: Normal rate and regular rhythm.  No extrasystoles are present.    Chest Wall: PMI is not displaced.     Pulses: Intact distal pulses.     Heart sounds: Normal heart sounds. No murmur heard.  No friction rub. No gallop.   Pulmonary:     Effort: Pulmonary effort is normal. No respiratory distress.     Breath sounds: Normal breath sounds.  Chest:     Chest wall: No tenderness.  Musculoskeletal:        General: No swelling. Normal range of motion.     Cervical back: Normal range of motion and neck supple.  Skin:    General: Skin is warm.  Neurological:     General: No focal deficit present.     Mental Status: She is alert and oriented to person, place, and time.  Psychiatric:        Mood and Affect: Mood normal.        Behavior: Behavior normal.        Thought Content: Thought content normal.        Judgment: Judgment normal.      Adult ECG Report  Rate: 60 ;  Rhythm: normal sinus rhythm; borderline left atrial abnormality.  Otherwise normal axis, normal durations.  Narrative Interpretation: Essentially normal EKG.  Recent Labs:    Lab Results  Component Value Date   CHOL 218 (H) 02/14/2020   HDL 76 02/14/2020   LDLCALC 121 (H) 02/14/2020   LDLDIRECT 139.2 03/10/2008   TRIG 103 02/14/2020   CHOLHDL 2.9 02/14/2020   Lab Results  Component Value Date   CREATININE 0.76 02/14/2020   BUN 16 02/14/2020   NA 140 02/14/2020   K 4.5 02/14/2020   CL 102 02/14/2020   CO2 30 02/14/2020   Lab Results  Component Value Date   TSH 1.01 02/14/2020    ASSESSMENT/PLAN    Problem List Items Addressed This Visit    Palpitations - Primary (Chronic)    Currently on Toprol 25 mg daily.  We talked  about taking additional dose of Toprol for spells of fast heart rate.  Otherwise continue to adequately hydrate.  We also talked about the pros and cons of aspirin.  She is currently listed as taking an aspirin daily.    Consensus on this topic is is is not clear.  She has cardiac risk factors, but has had a negative carotid evaluation.  Without active evidence of CAD that is obstructive, not sure how much benefit she gets from aspirin.  We could potentially consider coronary calcium score to understand her baseline risk.  She was not ready to make that decision yet, we can consider in the future. XMI recommendation will be perhaps aspirin 3 days a week at alternative and then as needed for concerning symptoms.      Relevant Orders   EKG 12-Lead (Completed)   Hyperlipidemia with target LDL less than 100 (Chronic)    She was converted from atorvastatin 20 mg to rosuvastatin 5 mg.  Seems to be tolerating it better.  Lipids relatively borderline controlled.  LDL was 121 last check.  If not able to tolerate 10 mg rosuvastatin, could consider the addition of either Zetia or Nexletol.  This should get her down to least 100 LDL      Elevated blood pressure reading    Blood pressure recorded is 140/80.  This is borderline for hypertension.  She is little bit stressed.  Currently only stable dose of Toprol with no other antihypertensives.  Probably would not increase Toprol up for standing medication to treat blood pressure based on resting heart rate of 60.  Will try to avoid adding another agent, but if necessary, would probably start with ACE inhibitor/ARB plus or minus thiazide diuretic.         COVID-19 Education: The signs and symptoms of COVID-19 were discussed with the patient and how to seek care for testing (follow up with PCP or arrange E-visit).   The importance of social distancing and COVID-19 vaccination was discussed today.  The patient is practicing social distancing & Masking.    I spent a total of 8mnutes with the patient spent in direct patient consultation.  Additional time spent with chart review  / charting (studies, outside notes, etc): 10 Total Time: 31 min   Current medicines are reviewed at length with the patient today.  (+/- concerns) n/a  This visit occurred during the SARS-CoV-2 public health emergency.  Safety protocols were in place, including screening questions prior to the visit, additional usage of staff PPE, and extensive cleaning of exam room while observing appropriate contact time as indicated for disinfecting solutions.  Notice: This dictation was prepared with Dragon dictation along with smaller phrase technology. Any transcriptional errors that result from this process are unintentional and may not be corrected upon review.  Patient  Instructions / Medication Changes & Studies & Tests Ordered   Patient Instructions  Medication Instructions:  No changes   if you extra beats fast heartrate - you can take an extra 1/2 tablet of Toprol.  *If you need a refill on your cardiac medications before your next appointment, please call your pharmacy*   Lab Work: Not needed If you have labs (blood work) drawn today and your tests are completely normal, you will receive your results only by: [name] MyChart Message (if you have MyChart) OR . A paper copy in the mail If you have any lab test that is abnormal or we need to change your treatment, we will call you to review the results.   Testing/Procedures: Not needed   Follow-Up: At [facility], you and your health needs are our priority.  As part of our continuing mission to provide you with exceptional heart care, we have created designated Provider Care Teams.  These Care Teams include your primary Cardiologist (physician) and Advanced Practice Providers (APPs -  Physician Assistants and Nurse Practitioners) who all work together to provide you with the care you need, when you need it.  We  recommend signing up for the patient portal called "MyChart".  Sign up information is provided on this After Visit Summary.  MyChart is used to connect with patients for Virtual Visits (Telemedicine).  Patients are able to view lab/test results, encounter notes, upcoming appointments, etc.  Non-urgent messages can be sent to your provider as well.   To learn more about what you can do with MyChart, go to [email or web address].    Your next appointment:   12 month(s)  The format for your next appointment:   In Person  Provider:   Glenetta Hew, MD   Other Instructions   Studies Ordered:   Orders Placed This Encounter  Procedures  . EKG 12-Lead     Glenetta Hew, M.D., M.S. Interventional Cardiologist   Pager # [phone] Phone # [phone] [address] [place], [place] [address]   Thank you for choosing Heartcare at [facility]!!

## 2020-02-28 NOTE — Patient Instructions (Addendum)
Medication Instructions:  No changes   if you extra beats fast heartrate - you can take an extra 1/2 tablet of Toprol.  *If you need a refill on your cardiac medications before your next appointment, please call your pharmacy*   Lab Work: Not needed If you have labs (blood work) drawn today and your tests are completely normal, you will receive your results only by: [name] MyChart Message (if you have MyChart) OR . A paper copy in the mail If you have any lab test that is abnormal or we need to change your treatment, we will call you to review the results.   Testing/Procedures: Not needed   Follow-Up: At [facility], you and your health needs are our priority.  As part of our continuing mission to provide you with exceptional heart care, we have created designated Provider Care Teams.  These Care Teams include your primary Cardiologist (physician) and Advanced Practice Providers (APPs -  Physician Assistants and Nurse Practitioners) who all work together to provide you with the care you need, when you need it.  We recommend signing up for the patient portal called "MyChart".  Sign up information is provided on this After Visit Summary.  MyChart is used to connect with patients for Virtual Visits (Telemedicine).  Patients are able to view lab/test results, encounter notes, upcoming appointments, etc.  Non-urgent messages can be sent to your provider as well.   To learn more about what you can do with MyChart, go to [email or web address].    Your next appointment:   12 month(s)  The format for your next appointment:   In Person  Provider:   Glenetta Hew, MD   Other Instructions

## 2020-03-02 ENCOUNTER — Ambulatory Visit
Admission: RE | Admit: 2020-03-02 | Discharge: 2020-03-02 | Disposition: A | Discharge status: Home / Self Care | Payer: Medicare Other | Source: Ambulatory Visit | Attending: Family Medicine | Admitting: Family Medicine

## 2020-03-02 DIAGNOSIS — E041 Nontoxic single thyroid nodule: Secondary | ICD-10-CM | POA: Diagnosis not present

## 2020-03-03 ENCOUNTER — Other Ambulatory Visit: Payer: Self-pay

## 2020-03-03 DIAGNOSIS — E041 Nontoxic single thyroid nodule: Secondary | ICD-10-CM

## 2020-03-06 ENCOUNTER — Encounter: Payer: Self-pay | Admitting: Cardiology

## 2020-03-06 DIAGNOSIS — R030 Elevated blood-pressure reading, without diagnosis of hypertension: Secondary | ICD-10-CM | POA: Insufficient documentation

## 2020-03-06 NOTE — Assessment & Plan Note (Addendum)
Currently on Toprol 25 mg daily.  We talked about taking additional dose of Toprol for spells of fast heart rate.  Otherwise continue to adequately hydrate.  We also talked about the pros and cons of aspirin.  She is currently listed as taking an aspirin daily.    Consensus on this topic is is is not clear.  She has cardiac risk factors, but has had a negative carotid evaluation.  Without active evidence of CAD that is obstructive, not sure how much benefit she gets from aspirin.  We could potentially consider coronary calcium score to understand her baseline risk.  She was not ready to make that decision yet, we can consider in the future. XMI recommendation will be perhaps aspirin 3 days a week at alternative and then as needed for concerning symptoms.

## 2020-03-06 NOTE — Assessment & Plan Note (Addendum)
She was converted from atorvastatin 20 mg to rosuvastatin 5 mg.  Seems to be tolerating it better.  Lipids relatively borderline controlled.  LDL was 121 last check.  If not able to tolerate 10 mg rosuvastatin, could consider the addition of either Zetia or Nexletol.  This should get her down to least 100 LDL

## 2020-03-06 NOTE — Assessment & Plan Note (Signed)
Blood pressure recorded is 140/80.  This is borderline for hypertension.  She is little bit stressed.  Currently only stable dose of Toprol with no other antihypertensives.  Probably would not increase Toprol up for standing medication to treat blood pressure based on resting heart rate of 60.  Will try to avoid adding another agent, but if necessary, would probably start with ACE inhibitor/ARB plus or minus thiazide diuretic.

## 2020-03-30 DIAGNOSIS — M7741 Metatarsalgia, right foot: Secondary | ICD-10-CM | POA: Diagnosis not present

## 2020-04-10 DIAGNOSIS — Z23 Encounter for immunization: Secondary | ICD-10-CM | POA: Diagnosis not present

## 2020-04-16 ENCOUNTER — Encounter: Payer: Self-pay | Admitting: Endocrinology

## 2020-04-16 ENCOUNTER — Ambulatory Visit (INDEPENDENT_AMBULATORY_CARE_PROVIDER_SITE_OTHER): Payer: Medicare Other | Admitting: Endocrinology

## 2020-04-16 ENCOUNTER — Other Ambulatory Visit: Payer: Self-pay

## 2020-04-16 VITALS — HR 67 | Ht 65.0 in | Wt 158.0 lb

## 2020-04-16 DIAGNOSIS — Z85850 Personal history of malignant neoplasm of thyroid: Secondary | ICD-10-CM | POA: Diagnosis not present

## 2020-04-16 NOTE — Patient Instructions (Signed)
Let's check the biopsy, guided by ultrasound.  you will receive a phone call, about a day and time for an appointment. If no cancer is found, Please come back for a follow-up appointment in 1 year.

## 2020-04-16 NOTE — Progress Notes (Signed)
Subjective:    Patient ID: Amanda [name], female    DOB: 10/09/1950, 69 y.o.   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  She does not notice the goiter.    Past Medical History:  Diagnosis Date  . Anxiety   . Celiac disease   . Chronic constipation   . COVID-19 virus vaccine not available   . GERD (gastroesophageal reflux disease)    occasionally (per pt takes apple cider vinager)  . Heart palpitations    cardiologist-- dr Amanda [name]--- event monitor 08-24-2011 epic ;  nuclear stress test 03-16-2011 (epic) normal w/ no ischemia, ef 65%;  echo 01-18-2013  ef 60-65%, G1DD  . Hiatal hernia   . History of subdural hematoma    12/ 23/ 2010  s/p  craniotomy w/ hematoma evacuation (pt had a fall w/ concussion)  per pt no residual  . History of thyroid cancer followed by dr Amanda [name]---   s/p  left thyroidectomy --- per pt no radiation and no recurrence  . Hyperlipidemia   . Immunization, single disease 06/09/2019   1st dose moderma vaccine administered  . Insomnia   . OA (osteoarthritis)   . Osteopenia, T score -2.1 FRAX 9.4%/1.3% stable from prior DEXA 06/2015  . Restless leg syndrome   . Right thyroid nodule    followed by dr Amanda [name]--- last ultrasound in epic 03-30-2018 stable , to bx  . STD (sexually transmitted disease), HSV   . 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 Wears glasses     Past Surgical History:  Procedure Laterality Date  . ABDOMINAL HYSTERECTOMY  1986   ovaries remain  . BREAST REDUCTION SURGERY Bilateral 1999  . CATARACT EXTRACTION W/ INTRAOCULAR LENS  IMPLANT, BILATERAL  2019  . COLONOSCOPY  last one 11-22-2018  . ESOPHAGOGASTRODUODENOSCOPY    . KNEE ARTHROSCOPY Bilateral 2002  . SUBDURAL HEMATOMA EVACUATION VIA CRANIOTOMY  04-30-2009  _0    left frontotemporapartietal   . THYROID LOBECTOMY Left 05-04-2001  _1   dr Amanda [name]   . VULVECTOMY N/A 03/12/2019   Procedure: WIDE LOCAL EXCISION OF CARCINOMA IS SITU OF VULVA;  Surgeon: Amanda Auerbach, MD;  Location: [facility];  Service: Gynecology;  Laterality: N/A;  request to follow in [place] block time requests one hour colposcope available in OR with acetic acid.  Amanda [name] N/A 06/17/2019   Procedure: WIDE LOCAL  EXCISION VULVECTOMY;  Surgeon: Amanda Amber, MD;  Location: [facility];  Service: Gynecology;  Laterality: N/A;    Social History   Socioeconomic History  . Marital status: Married    Spouse name: Not on file  . Number of children: 2  . Years of education: Not on file  . Highest education level: Not on file  Occupational History  . Not on file  Tobacco Use  . Smoking status: Never Smoker  . Smokeless tobacco: Never Used  Vaping Use  . Vaping Use: Never used  Substance and Sexual Activity  . Alcohol use: Yes    Alcohol/week: 7.0 standard drinks    Types: 7 Cans of beer per week    Comment: 1 beer nightly  . Drug use: Never  . Sexual activity: Yes    Partners: Male    Birth control/protection: Surgical, None    Comment: HYST-1st intercourse 69 yo-Fewer than 5 partners  Other  Topics Concern  . Not on file  Social History Narrative   Married mother of 24 with 50 year old granddaughter (born in 2019). Her son Amanda [name] is patient of Dr. Yong [name]       Lives with husband in a three story home, no issue with stairs.    Highest level of education is an associates.       She exercises regularly, leads an active lifestyle, has a healthy diet, and is very socially engaged. Self-described "HSP: highly sensitive person" and therefore tends to worry more about symptoms than others might.      Hobbies: enjoys time with friends, time on front porch with friends during covid, park daily, family time   Social Determinants of Health   Financial Resource Strain: Not on file  Food Insecurity: Not on file  Transportation  Needs: Not on file  Physical Activity: Not on file  Stress: Not on file  Social Connections: Not on file  Intimate Partner Violence: Not on file    Current Outpatient Medications on File Prior to Visit  Medication Sig Dispense Refill  . Alum Hydroxide-Mag Carbonate (GAVISCON PO) Take by mouth.    . APPLE CIDER VINEGAR PO Take by mouth 2 (two) times daily with a meal. Pt takes 2 capsules with each meal. Pt only eats 2 meals. Total of 4 capsules per day.    [name] aspirin EC 81 MG tablet Take 81 mg by mouth daily. (Patient not taking: Reported on 02/28/2020)    . bisacodyl (DULCOLAX) 5 MG EC tablet Take 5 mg by mouth daily as needed for moderate constipation.    . Black Currant Seed Oil 500 MG CAPS Take by mouth daily.     . Calcium Carbonate (CALCIUM 500 PO) Take by mouth daily.    . cholecalciferol (VITAMIN D3) 25 MCG (1000 UT) tablet Take 1,000 Units by mouth daily. Take 2 in the am takes with magnesium    . clonazePAM (KLONOPIN) 0.5 MG tablet Take 1 tablet (0.5 mg total) by mouth at bedtime as needed for anxiety. 90 tablet 1  . CO ENZYME Q-10 PO Take by mouth daily.     . Cyanocobalamin (VITAMIN B-12 ER PO) Take 5,000 mcg by mouth daily.    . diclofenac sodium (VOLTAREN) 1 % GEL APPLY TOPICALLY TO AFFECTED AREA 4 TIMES A DAY 100 g 1  . escitalopram (LEXAPRO) 10 MG tablet Take 10 mg by mouth daily.    . famotidine (PEPCID) 20 MG tablet Take 20 mg by mouth 2 (two) times daily.     [name] ibuprofen (ADVIL) 600 MG tablet Take 1 tablet (600 mg total) by mouth every 6 (six) hours as needed for moderate pain. For AFTER surgery 30 tablet 1  . Melatonin 3 MG CAPS Take by mouth at bedtime as needed.     . metoprolol succinate (TOPROL-XL) 25 MG 24 hr tablet TAKE 1 TABLET BY MOUTH EVERY DAY 90 tablet 3  . Multiple Vitamin (MULTIVITAMIN) tablet Take 1 tablet by mouth daily.      . NON FORMULARY Bausch and lomb ointment-Muro-//128, 5%ointment- Each eye nightly    . pantoprazole (PROTONIX) 40 MG tablet Take 40  mg by mouth daily.    [name] Propylene Glycol (SYSTANE BALANCE) 0.6 % SOLN Apply 1 drop to eye 2 (two) times daily. For dry eyes    . rOPINIRole (REQUIP) 0.25 MG tablet Take 1 tablet (0.25 mg total) by mouth at bedtime. 90 tablet 3  . rosuvastatin (CRESTOR) 5 MG  tablet Take 1 tablet (5 mg total) by mouth daily. 90 tablet 3  . valACYclovir (VALTREX) 500 MG tablet One tab bid as needed for outbreak 180 tablet 1   No current facility-administered medications on file prior to visit.    Allergies  Allergen Reactions  . Ambien [Zolpidem Tartrate] Other (See Comments)    Causes Neurological problems with very bad dizziness, pains, disorientation  . Cortisone Other (See Comments)    REACTION: "mania" post ESI  . Eszopiclone And Related Other (See Comments)    Causes Neurological problems with very bad dizziness, pains, disorientation  . Levofloxacin Other (See Comments)    Muscle spasms and jerking movements  . Dicloxacillin Other (See Comments)    Stomach problems, swelling of tongue  . Dilantin [Phenytoin Sodium Extended] Hives and Itching  . Erythromycin Other (See Comments)    Tears stomach up  . Avelox [Moxifloxacin Hcl In Nacl]     Pt unsure of reaction.  . Influenza Vaccines     Guillain Barre in the past- 20 years ago  . Other     Celiac disease  . Nitrofuran Derivatives Other (See Comments)    unknown  . Phenytoin Sodium Extended Rash  . Trazodone And Nefazodone Other (See Comments)    dizzy    Family History  Problem Relation Age of Onset  . Diabetes Maternal Aunt   . Breast cancer Maternal Aunt 60  . Liver cancer Father        alcohol related likely  . Failure to thrive Mother        5  . Heart disease Maternal Grandfather   . Hyperlipidemia Maternal Grandfather   . Hypertension Paternal Grandfather   . Colon cancer Neg Hx   . Thyroid disease Neg Hx     Pulse 67   Ht _0  (1.651 m)   Wt 158 lb (71.7 kg)   LMP  (LMP Unknown)   SpO2 96%   BMI 26.29 kg/m     Review of Systems Denies hoarseness, neck pain, sob, cough, diarrhea, flushing, and cold intolerance.     Objective:   Physical Exam VITAL SIGNS:  See vs page GENERAL: no distress NECK: Neck: a healed scar is present.  I cannot appreciate the nodules.     Lab Results  Component Value Date   TSH 1.01 02/14/2020   pathol (2002): TWO FOCI OF INCIDENTAL PAPILLARY MICROCARCINOMA, 4.0 MM AND 1.0 MM.   I have reviewed outside records, and summarized:  Pt was noted to have MNG, and referred here.  She has had surveillance US of the thyroid.        Assessment & Plan:  MNG, new to me: uncertain etiology and prognosis.    Patient Instructions  Let's check the biopsy, guided by ultrasound.  you will receive a phone call, about a day and time for an appointment. If no cancer is found, Please come back for a follow-up appointment in 1 year.

## 2020-04-29 ENCOUNTER — Ambulatory Visit
Admission: RE | Admit: 2020-04-29 | Discharge: 2020-04-29 | Disposition: A | Discharge status: Home / Self Care | Payer: Medicare Other | Source: Ambulatory Visit | Attending: Endocrinology | Admitting: Endocrinology

## 2020-04-29 ENCOUNTER — Other Ambulatory Visit (HOSPITAL_COMMUNITY)
Admission: RE | Admit: 2020-04-29 | Discharge: 2020-04-29 | Disposition: A | Discharge status: Home / Self Care | Payer: Medicare Other | Source: Ambulatory Visit | Attending: Endocrinology | Admitting: Endocrinology

## 2020-04-29 DIAGNOSIS — Z85850 Personal history of malignant neoplasm of thyroid: Secondary | ICD-10-CM

## 2020-04-29 DIAGNOSIS — D34 Benign neoplasm of thyroid gland: Secondary | ICD-10-CM | POA: Insufficient documentation

## 2020-04-29 DIAGNOSIS — E041 Nontoxic single thyroid nodule: Secondary | ICD-10-CM | POA: Diagnosis not present

## 2020-04-30 LAB — CYTOLOGY - NON PAP

## 2020-05-03 ENCOUNTER — Other Ambulatory Visit: Payer: Self-pay | Admitting: Family Medicine

## 2020-05-03 DIAGNOSIS — G2581 Restless legs syndrome: Secondary | ICD-10-CM

## 2020-05-06 ENCOUNTER — Encounter: Payer: Self-pay | Admitting: Family Medicine

## 2020-05-06 ENCOUNTER — Telehealth (INDEPENDENT_AMBULATORY_CARE_PROVIDER_SITE_OTHER): Payer: Medicare Other | Admitting: Family Medicine

## 2020-05-06 VITALS — BP 132/82 | HR 80 | Temp 97.7°F | Ht 65.0 in | Wt 153.0 lb

## 2020-05-06 DIAGNOSIS — J029 Acute pharyngitis, unspecified: Secondary | ICD-10-CM

## 2020-05-06 DIAGNOSIS — R519 Headache, unspecified: Secondary | ICD-10-CM | POA: Diagnosis not present

## 2020-05-06 LAB — POCT RAPID STREP A (OFFICE): Rapid Strep A Screen: NEGATIVE

## 2020-05-06 LAB — POCT INFLUENZA A/B
Influenza A, POC: NEGATIVE
Influenza B, POC: NEGATIVE

## 2020-05-06 NOTE — Progress Notes (Signed)
VIRTUAL VISIT VIA VIDEO  I connected with Amanda [name] on 05/06/20 at 11:00 AM EST by elemedicine application and verified that I am speaking with the correct person using two identifiers. Location patient: Home Location provider: [facility], Office Persons participating in the virtual visit: Patient, Dr. Raoul Pitch and  J.Martinez- Rhae Lerner, [place] discussed the limitations of evaluation and management by telemedicine and the availability of in person appointments. The patient expressed understanding and agreed to proceed.   SUBJECTIVE Chief Complaint  Patient presents with  . Nasal Congestion    For 3 days took inhome covid test and it was negative   . Fatigue    Has no energy/ no known COVID exposure     HPI: Amanda [name] is  a 69 y.o. female present for acute illness of nasal congestion, PND drainage, sore throat, cough, headache and fatigue started 05/03/2020. She has had no sick contacts. She did a home covid test day 2 of illness and it was negative-she has concerns for accuracy. She denies ear pain, rash and GI sx.  Covid series w/ booster UTD. influenza not UTD. Tolerating p.o.  ROS: See pertinent positives and negatives per HPI.  Patient Active Problem List   Diagnosis Date Noted  . Elevated blood pressure reading 03/06/2020  . Pharyngoesophageal dysphagia 12/23/2019  . 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 Pain of left calf 09/27/2019  . Pain in left knee 09/24/2019  . 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 History of subdural hematoma 03/26/2019  . 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 History of Guillain-Barre syndrome 04/01/2018  . Hyperlipidemia with target LDL less than 100 03/30/2018  . Mastalgia, left, followed by GYN 03/29/2018  . Fibromyalgia 03/29/2018  . Fuchs' corneal dystrophy, 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  . Insulin resistance 03/29/2018  . History of hysterectomy 03/29/2018  . Grade I  diastolic dysfunction 95/28/4132  . Thyroid nodule 03/27/2018  . Presbycusis of both ears 03/10/2017  . Dysthymia 09/26/2016  . Palpitations 01/17/2013  . GERD (gastroesophageal reflux disease)   . Restless leg syndrome, on Requip   . Allergic rhinitis 06/25/2009  . Diaphragmatic hernia 02/16/2009  . IBS (irritable bowel syndrome) 02/16/2009  . Celiac disease 11/27/2007  . Insomnia 07/05/2007  . Osteoarthritis 07/05/2007  . Osteopenia 10/03/2006  . Left carotid bruit 10/03/2006  . History of thyroid cancer 10/03/2006    Social History   Tobacco Use  . Smoking status: Never Smoker  . Smokeless tobacco: Never Used  Substance Use Topics  . Alcohol use: Yes    Alcohol/week: 7.0 standard drinks    Types: 7 Cans of beer per week    Comment: 1 beer nightly    Current Outpatient Medications:  .  Alum Hydroxide-Mag Carbonate (GAVISCON PO), Take by mouth., Disp: , Rfl:  .  APPLE CIDER VINEGAR PO, Take by mouth 2 (two) times daily with a meal. Pt takes 2 capsules with each meal. Pt only eats 2 meals. Total of 4 capsules per day., Disp: , Rfl:  .  aspirin EC 81 MG tablet, Take 81 mg by mouth daily., Disp: , Rfl:  .  bisacodyl (DULCOLAX) 5 MG EC tablet, Take 5 mg by mouth daily as needed for moderate constipation., Disp: , Rfl:  .  Black Currant Seed Oil 500 MG CAPS, Take by mouth daily. , Disp: , Rfl:  .  Calcium Carbonate (CALCIUM 500 PO), Take by mouth daily., Disp: , Rfl:  .  cholecalciferol (VITAMIN D3) 25 MCG (1000 UT) tablet, Take 1,000 Units by mouth daily. Take 2 in the am takes with magnesium, Disp: , Rfl:  .  clonazePAM (KLONOPIN) 0.5 MG tablet, Take 1 tablet (0.5 mg total) by mouth at bedtime as needed for anxiety., Disp: 90 tablet, Rfl: 1 .  CO ENZYME Q-10 PO, Take by mouth daily. , Disp: , Rfl:  .  Cyanocobalamin (VITAMIN B-12 ER PO), Take 5,000 mcg by mouth daily., Disp: , Rfl:  .  diclofenac sodium (VOLTAREN) 1 % GEL, APPLY TOPICALLY TO AFFECTED AREA 4 TIMES A DAY, Disp:  100 g, Rfl: 1 .  escitalopram (LEXAPRO) 10 MG tablet, Take 10 mg by mouth daily., Disp: , Rfl:  .  ibuprofen (ADVIL) 600 MG tablet, Take 1 tablet (600 mg total) by mouth every 6 (six) hours as needed for moderate pain. For AFTER surgery, Disp: 30 tablet, Rfl: 1 .  Melatonin 3 MG CAPS, Take by mouth at bedtime as needed. , Disp: , Rfl:  .  metoprolol succinate (TOPROL-XL) 25 MG 24 hr tablet, TAKE 1 TABLET BY MOUTH EVERY DAY, Disp: 90 tablet, Rfl: 3 .  Multiple Vitamin (MULTIVITAMIN) tablet, Take 1 tablet by mouth daily., Disp: , Rfl:  .  NON FORMULARY, Bausch and lomb ointment-Muro-//128, 5%ointment- Each eye nightly, Disp: , Rfl:  .  Propylene Glycol 0.6 % SOLN, Apply 1 drop to eye 2 (two) times daily. For dry eyes, Disp: , Rfl:  .  rOPINIRole (REQUIP) 0.25 MG tablet, TAKE 1 TABLET BY MOUTH AT BEDTIME, Disp: 90 tablet, Rfl: 3 .  rosuvastatin (CRESTOR) 5 MG tablet, Take 1 tablet (5 mg total) by mouth daily., Disp: 90 tablet, Rfl: 3 .  valACYclovir (VALTREX) 500 MG tablet, One tab bid as needed for outbreak, Disp: 180 tablet, Rfl: 1  Allergies  Allergen Reactions  . Ambien [Zolpidem Tartrate] Other (See Comments)    Causes Neurological problems with very bad dizziness, pains, disorientation  . Cortisone Other (See Comments)    REACTION: "mania" post ESI  . Eszopiclone And Related Other (See Comments)    Causes Neurological problems with very bad dizziness, pains, disorientation  . Levofloxacin Other (See Comments)    Muscle spasms and jerking movements  . Dicloxacillin Other (See Comments)    Stomach problems, swelling of tongue  . Dilantin [Phenytoin Sodium Extended] Hives and Itching  . Erythromycin Other (See Comments)    Tears stomach up  . Avelox [Moxifloxacin Hcl In Nacl]     Pt unsure of reaction.  . Influenza Vaccines     Guillain Barre in the past- 20 years ago  . Other     Celiac disease  . Nitrofuran Derivatives Other (See Comments)    unknown  . Phenytoin Sodium Extended  Rash  . Trazodone And Nefazodone Other (See Comments)    dizzy    OBJECTIVE: BP 132/82 (BP Location: Left Arm, Patient Position: Sitting, Cuff Size: Large)   Pulse 80   Temp 97.7 F (36.5 C) (Temporal)   Ht 5' 5"  (1.651 m)   Wt 153 lb (69.4 kg)   LMP  (LMP Unknown)   SpO2 98%   BMI 25.46 kg/m  Gen: No acute distress. Nontoxic  Chest: no cough or shortness of breath  Neuro:  Alert. Oriented x3  Psych: Normal affect and demeanor. Normal speech. Normal thought content and judgment.  ASSESSMENT AND PLAN: Amanda [name] is a 69 y.o. female present for  Pharyngitis, unspecified etiology/headache Drive up testing being arranged -  POCT Influenza A/B - Novel Coronavirus, NAA ([facility]) - POCT rapid strep A - Upper Respiratory Culture Rest, hydrate.  +/- flonase, start mucinex (DM if cough) Over-the-counter supportive care encouraged and discussed. Patient will be called with point-of-care results today and if positive will treat appropriately with either Tamiflu or antibiotics. Patient is aware of Covid test will take approximately 24 hours to result.  She should remain in isolation.  If Covid test is negative may discontinue isolation. Patient aware that this may be a viral illness that is not related to a Covid infection.  In the event all testing today results negative-would encourage patient to monitor and treat as a viral illness.  Antibiotics and/or Tamiflu would not be helpful in that case.  Patient reports understanding. F/U 2 weeks of not improved, sooner if worsening.  28 minutes was spent with patient today during telephone visit.  Howard Pouch, DO 05/06/2020   Return in about 2 weeks (around 05/20/2020), or if symptoms worsen or fail to improve.  Orders Placed This Encounter  Procedures  . Novel Coronavirus, NAA ([facility])  . Upper Respiratory Culture  . POCT Influenza A/B  . POCT rapid strep A   No orders of the defined types were placed in this  encounter.  Referral Orders  No referral(s) requested today

## 2020-05-06 NOTE — Patient Instructions (Signed)
Viral Illness, Adult Viruses are tiny germs that can get into a person's body and cause illness. There are many different types of viruses, and they cause many types of illness. Viral illnesses can range from mild to severe. They can affect various parts of the body. Common illnesses that are caused by a virus include colds and the flu. Viral illnesses also include serious conditions such as HIV/AIDS (human immunodeficiency virus/acquired immunodeficiency syndrome). A few viruses have been linked to certain cancers. What are the causes? Many types of viruses can cause illness. Viruses invade cells in your body, multiply, and cause the infected cells to malfunction or die. When the cell dies, it releases more of the virus. When this happens, you develop symptoms of the illness, and the virus continues to spread to other cells. If the virus takes over the function of the cell, it can cause the cell to divide and grow out of control, as is the case when a virus causes cancer. Different viruses get into the body in different ways. You can get a virus by:  Swallowing food or water that is contaminated with the virus.  Breathing in droplets that have been coughed or sneezed into the air by an infected person.  Touching a surface that has been contaminated with the virus and then touching your eyes, nose, or mouth.  Being bitten by an insect or animal that carries the virus.  Having sexual contact with a person who is infected with the virus.  Being exposed to blood or fluids that contain the virus, either through an open cut or during a transfusion. If a virus enters your body, your body's defense system (immune system) will try to fight the virus. You may be at higher risk for a viral illness if your immune system is weak. What are the signs or symptoms? Symptoms vary depending on the type of virus and the location of the cells that it invades. Common symptoms of the main types of viral illnesses  include: Cold and flu viruses  Fever.  Headache.  Sore throat.  Muscle aches.  Nasal congestion.  Cough. Digestive system (gastrointestinal) viruses  Fever.  Abdominal pain.  Nausea.  Diarrhea. Liver viruses (hepatitis)  Loss of appetite.  Tiredness.  Yellowing of the skin (jaundice). Brain and spinal cord viruses  Fever.  Headache.  Stiff neck.  Nausea and vomiting.  Confusion or sleepiness. Skin viruses  Warts.  Itching.  Rash. Sexually transmitted viruses  Discharge.  Swelling.  Redness.  Rash. How is this treated? Viruses can be difficult to treat because they live within cells. Antibiotic medicines do not treat viruses because these drugs do not get inside cells. Treatment for a viral illness may include:  Resting and drinking plenty of fluids.  Medicines to relieve symptoms. These can include over-the-counter medicine for pain and fever, medicines for cough or congestion, and medicines to relieve diarrhea.  Antiviral medicines. These drugs are available only for certain types of viruses. They may help reduce flu symptoms if taken early. There are also many antiviral medicines for hepatitis and HIV/AIDS. Some viral illnesses can be prevented with vaccinations. A common example is the flu shot. Follow these instructions at home: Medicines   Take over-the-counter and prescription medicines only as told by your health care provider.  If you were prescribed an antiviral medicine, take it as told by your health care provider. Do not stop taking the medicine even if you start to feel better.  Be aware of when  antibiotics are needed and when they are not needed. Antibiotics do not treat viruses. If your health care provider thinks that you may have a bacterial infection as well as a viral infection, you may get an antibiotic. ? Do not ask for an antibiotic prescription if you have been diagnosed with a viral illness. That will not make your  illness go away faster. ? Frequently taking antibiotics when they are not needed can lead to antibiotic resistance. When this develops, the medicine no longer works against the bacteria that it normally fights. General instructions  Drink enough fluids to keep your urine clear or pale yellow.  Rest as much as possible.  Return to your normal activities as told by your health care provider. Ask your health care provider what activities are safe for you.  Keep all follow-up visits as told by your health care provider. This is important. How is this prevented? Take these actions to reduce your risk of viral infection:  Eat a healthy diet and get enough rest.  Wash your hands often with soap and water. This is especially important when you are in public places. If soap and water are not available, use hand sanitizer.  Avoid close contact with friends and family who have a viral illness.  If you travel to areas where viral gastrointestinal infection is common, avoid drinking water or eating raw food.  Keep your immunizations up to date. Get a flu shot every year as told by your health care provider.  Do not share toothbrushes, nail clippers, razors, or needles with other people.  Always practice safe sex.  Contact a health care provider if:  You have symptoms of a viral illness that do not go away.  Your symptoms come back after going away.  Your symptoms get worse. Get help right away if:  You have trouble breathing.  You have a severe headache or a stiff neck.  You have severe vomiting or abdominal pain. This information is not intended to replace advice given to you by your health care provider. Make sure you discuss any questions you have with your health care provider. Document Revised: 04/07/2017 Document Reviewed: 09/04/2015 Elsevier Patient Education  2020 [employer].

## 2020-05-08 LAB — SARS-COV-2, NAA 2 DAY TAT

## 2020-05-08 LAB — NOVEL CORONAVIRUS, NAA: SARS-CoV-2, NAA: NOT DETECTED

## 2020-05-09 LAB — CULTURE, UPPER RESPIRATORY
MICRO NUMBER:: 11366606
SPECIMEN QUALITY:: ADEQUATE

## 2020-05-19 ENCOUNTER — Telehealth: Payer: Self-pay

## 2020-05-19 NOTE — Telephone Encounter (Signed)
Patient called in saying she was experiencing for about a week a headache, dizziness, nausea and vertigo. Amanda [name] was triaged and the outcome was to go to the ED, but refused. Is it okay to schedule patient for an appointment tomorrow with an appointment?

## 2020-05-19 NOTE — Telephone Encounter (Signed)
Spoke with the pt, Amanda [name] to go to the ED she refuses to go and states that she wants to see dr. Yong Channel. Will schedule [name] for his next available.

## 2020-05-20 ENCOUNTER — Telehealth: Payer: Self-pay

## 2020-05-20 NOTE — Telephone Encounter (Signed)
Patient is scheduled for Friday.    Nurse Assessment Nurse: Mancel Bale, RN, Butch Penny Date/Time Eilene Ghazi Time): 05/19/2020 2:25:01 PM Confirm and document reason for call. If symptomatic, describe symptoms. ---Caller states she is having new symptoms of dizziness, headache and vertigo. States she woke up the headache and this was the worst symptom. States she had a subdural hematoma in 2010 and was seeing a neurologist and she would like a referral to go back for evaluation. Does the patient have any new or worsening symptoms? ---Yes Will a triage be completed? ---Yes Related visit to physician within the last 2 weeks? ---No Does the PT have any chronic conditions? (i.e. diabetes, asthma, this includes High risk factors for pregnancy, etc.) ---Yes List chronic conditions. ---Restless leg syndrome, Hx of subdural hematoma Is this a behavioral health or substance abuse call? ---No Guidelines Guideline Title Affirmed Question Affirmed Notes Nurse Date/Time (Eastern Time) Headache Severe pain in one eye Sheran Fava 05/19/2020 2:29:18 PM Disp. Time Eilene Ghazi Time) Disposition Final User 05/19/2020 2:20:53 PM Send to Urgent Ezra Sites 05/19/2020 2:38:17 PM Go to ED Now Yes Mancel Bale, RN, Annitta Jersey NOTE: All timestamps contained within this report are represented as [place] Standard Time. CONFIDENTIALTY NOTICE: This fax transmission is intended only for the addressee. It contains information that is legally privileged, confidential or otherwise protected from use or disclosure. If you are not the intended recipient, you are strictly prohibited from reviewing, disclosing, copying using or disseminating any of this information or taking any action in reliance on or regarding this information. If you have received this fax in error, please notify us immediately by telephone so that we can arrange for its return to [place]. Phone: [phone], Toll-Free: [phone], Fax: [phone] Page: 2  of 2 Call Id: [id number] [place] Disagree/Comply Disagree Caller Understands Yes PreDisposition Call Doctor Care Advice Given Per Guideline GO TO ED NOW: * You need to be seen in the Emergency Department. * Go to the ED at ___________ [place] now. Drive carefully. ANOTHER ADULT SHOULD DRIVE: * It is better and safer if another adult drives instead of you. Comments User: Marijo Conception, RN Date/Time (Eastern Time): 05/19/2020 2:35:02 PM Caller states she had a subdural hematoma many years ago and is concerned some of her symptoms may be related to the old hematoma. User: Marijo Conception, RN Date/Time Eilene Ghazi Time): 05/19/2020 2:38:57 PM Spoke with Andee Poles on the backline and she requested to be warm transferred to the patient. Referrals Warm transfer to backlin

## 2020-05-20 NOTE — Telephone Encounter (Signed)
There is a separate telephone message where to Rosanne Sack tried to convince the patient to go to the ED but she refused. See below.

## 2020-05-20 NOTE — Telephone Encounter (Signed)
I am fine with you going ahead and placing referral back to her prior provider under history of subdural hematoma.I appreciate ED recommendation- I think that is reasonable with concern to get stat imaging potentially. I am not hear this afternoon to be able to see her unfortunately.

## 2020-05-21 ENCOUNTER — Other Ambulatory Visit: Payer: Self-pay

## 2020-05-21 NOTE — Progress Notes (Signed)
Phone [phone] In person visit   Subjective:   Amanda [name] is a 70 y.o. year old very pleasant female patient who presents for/with See problem oriented charting Chief Complaint  Patient presents with  . Dizziness    Off and on for a couple months   . Headache    Off and on for a couple months   . Referral    Neuro     This visit occurred during the SARS-CoV-2 public health emergency.  Safety protocols were in place, including screening questions prior to the visit, additional usage of staff PPE, and extensive cleaning of exam room while observing appropriate contact time as indicated for disinfecting solutions.   Past Medical History-  Patient Active Problem List   Diagnosis Date Noted  . 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 History of Guillain-Barre syndrome 04/01/2018    Priority: High  . Thyroid nodule 03/27/2018    Priority: High  . Celiac disease 11/27/2007    Priority: High  . Insomnia 07/05/2007    Priority: High  . History of thyroid cancer 10/03/2006    Priority: High  . History of subdural hematoma 03/26/2019    Priority: Medium  . Herpes simplex type 1 infection 11/01/2018    Priority: Medium  . Hyperlipidemia with target LDL less than 100 03/30/2018    Priority: Medium  . Fibromyalgia 03/29/2018    Priority: Medium  . Fuchs' corneal dystrophy, 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    Priority: Medium  . Insulin resistance 03/29/2018    Priority: Medium  . Grade I diastolic dysfunction 91/47/8295    Priority: Medium  . Dysthymia 09/26/2016    Priority: Medium  . Palpitations 01/17/2013    Priority: Medium  . GERD (gastroesophageal reflux disease)     Priority: Medium  . Restless leg syndrome, on Requip     Priority: Medium  . IBS (irritable bowel syndrome) 02/16/2009    Priority: Medium  . Osteoarthritis 07/05/2007    Priority: Medium  . Osteopenia 10/03/2006    Priority: Medium  . Mastalgia, left,  followed by GYN 03/29/2018    Priority: Low  . History of hysterectomy 03/29/2018    Priority: Low  . Presbycusis of both ears 03/10/2017    Priority: Low  . Allergic rhinitis 06/25/2009    Priority: Low  . Diaphragmatic hernia 02/16/2009    Priority: Low  . Left carotid bruit 10/03/2006    Priority: Low  . Elevated blood pressure reading 03/06/2020  . Pharyngoesophageal dysphagia 12/23/2019  . 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 Pain of left calf 09/27/2019  . Pain in left knee 09/24/2019    Medications- reviewed and updated Current Outpatient Medications  Medication Sig Dispense Refill  . Alum Hydroxide-Mag Carbonate (GAVISCON PO) Take by mouth.    . APPLE CIDER VINEGAR PO Take by mouth 2 (two) times daily with a meal. Pt takes 2 capsules with each meal. Pt only eats 2 meals. Total of 4 capsules per day.    [name] aspirin EC 81 MG tablet Take 81 mg by mouth daily.    . bisacodyl (DULCOLAX) 5 MG EC tablet Take 5 mg by mouth daily as needed for moderate constipation.    . Black Currant Seed Oil 500 MG CAPS Take by mouth daily.     . Calcium Carbonate (CALCIUM 500 PO) Take by mouth daily.    . cholecalciferol (VITAMIN D3) 25 MCG (1000 UT) tablet Take 1,000 Units  by mouth daily. Take 2 in the am takes with magnesium    . clonazePAM (KLONOPIN) 0.5 MG tablet Take 1 tablet (0.5 mg total) by mouth at bedtime as needed for anxiety. 90 tablet 1  . CO ENZYME Q-10 PO Take by mouth daily.     . Cyanocobalamin (VITAMIN B-12 ER PO) Take 5,000 mcg by mouth daily.    . diclofenac sodium (VOLTAREN) 1 % GEL APPLY TOPICALLY TO AFFECTED AREA 4 TIMES A DAY 100 g 1  . escitalopram (LEXAPRO) 10 MG tablet Take 10 mg by mouth daily.    . meclizine (ANTIVERT) 25 MG tablet Take 1 tablet (25 mg total) by mouth 3 (three) times daily as needed for dizziness. 30 tablet 0  . Melatonin 3 MG CAPS Take by mouth at bedtime as needed.     . metoprolol succinate (TOPROL-XL) 25 MG 24 hr tablet TAKE 1 TABLET BY  MOUTH EVERY DAY 90 tablet 3  . Multiple Vitamin (MULTIVITAMIN) tablet Take 1 tablet by mouth daily.    . NON FORMULARY Bausch and lomb ointment-Muro-//128, 5%ointment- Each eye nightly    . rOPINIRole (REQUIP) 0.25 MG tablet TAKE 1 TABLET BY MOUTH AT BEDTIME 90 tablet 3  . rosuvastatin (CRESTOR) 5 MG tablet Take 1 tablet (5 mg total) by mouth daily. 90 tablet 3  . valACYclovir (VALTREX) 500 MG tablet One tab bid as needed for outbreak 180 tablet 1   No current facility-administered medications for this visit.     Objective:  BP 134/88   Pulse 74   Temp 97.9 F (36.6 C) (Temporal)   Ht 5' 5"  (1.651 m)   Wt 159 lb (72.1 kg)   LMP  (LMP Unknown)   SpO2 96%   BMI 26.46 kg/m  Gen: NAD, resting comfortably CV: RRR  Lungs: nonlabored, normal respiratory rate Abdomen: soft/nondistended Ext: no edema Skin: warm, dry Neuro: CN II-XII intact, sensation and reflexes normal throughout, 5/5 muscle strength in bilateral upper and lower extremities. Normal finger to nose. Normal rapid alternating movements. No pronator drift. Normal romberg. Normal gait.  Dix-Hallpike normal     Assessment and Plan  Dizzy/Vertigo in patient with history BPPV (prior saw ENT and neuro in 2017) as well as subdural hematoma in 2010- also with new headaches where prior subdural hematoma was located S:2-3 months patient has had some dizziness/nausea when she walks. Off and on for a few months. Walking seems to be the biggest trigger. Denies clear vertigo/room motion - feels dizzy and off balance like shes on a boat. She also feels like symptoms worsen with certain eye movements-has a visit with her eye doctor next week.  [facility] she has mentioned this there- and they recommended epley maneuvers which have helped some.   Was seen by ENT a few years ago for vertigo- diagnosed as BPPV.  Current episodes are more disorienting and does not remember nausea previously.   She called neurology  for a visit but was required to have a referral.   Other symptoms- perhaps mild decrease in appetite, perhaps slightly more forgetful. Has had some mild headaches off and on and taking more aleve/advil- not associated with the dizzy episodes.   She has had a subdural hematoma in 2010- had a couple sharp pains in that area recently but do not correlate with dizzy episodes  Prior saw neuro for memory concern, right sided occipital neuralgia- neck massage helps(she still does this). Also saw in 2016 for BPPV as above-  Was improving and no intervention at that time. She also saw ENT around that time as above- they showed her how to do epley maneuvers.  A/P: 70 year old female with history of subdural hematoma and BPPV presenting with intermittent dizziness/potential vertigo as well as new headaches located over prior site of subdural hematoma (no recent trauma). --dizziness episodes could be related to prior BPPV-has had improvement with Epley maneuvers.  Dix-Hallpike was not classically positive today. We will try meclizine -We will refer for vestibular rehab since Epley maneuver's have been helpful - In regards to headaches over prior subdural site-we will get an updated CT scan-this would also give [place] more information if any new gross abnormalities in the brain- though I do not strongly suspect something like a stroke with neurological exam being reassuring today-not starting with MRI and will defer to neurology -Patient specifically requests referral back to neurology and this was placed today  Recommended follow up: as needed for acute concerns- certainly contact us with new or worsening symptoms Future Appointments  Date Time Provider [facility]  08/24/2020  1:40 PM Marin Olp, MD [facility]  12/07/2020  2:30 PM Joseph Pierini, MD [facility] None   Lab/Order associations:   ICD-10-CM   1. Dizzy  [place] Ambulatory referral to Neurology    Ambulatory referral to Physical Therapy  2.  Left-sided headache  R51.9 CT Head Wo Contrast  3. History of subdural hematoma  Z86.79 Ambulatory referral to Neurology    CT Head Wo Contrast  4. Benign paroxysmal positional vertigo, unspecified laterality  H81.10 Ambulatory referral to Neurology    Ambulatory referral to Physical Therapy    Meds ordered this encounter  Medications  . meclizine (ANTIVERT) 25 MG tablet    Sig: Take 1 tablet (25 mg total) by mouth 3 (three) times daily as needed for dizziness.    Dispense:  30 tablet    Refill:  0    Time Spent: 31 minutes of total time (2:07 PM- 2:38 PM) was spent on the date of the encounter performing the following actions: chart review prior to seeing the patient, obtaining history, performing a medically necessary exam, counseling on the treatment plan including discussing role of vestibular rehab which she has not had in the past as well as reasoning for scans, placing orders, and documenting in our EHR.   Return precautions advised.  Garret Reddish, MD

## 2020-05-21 NOTE — Patient Instructions (Addendum)
We will call you within two weeks about your referral to neurology and for head CT and to vestibular rehab with physical therapy. If you do not hear within 2 weeks, give [place] a call.   Recommended follow up: as needed for acute concerns- certainly contact us with new or worsening symptoms

## 2020-05-22 ENCOUNTER — Encounter: Payer: Self-pay | Admitting: Family Medicine

## 2020-05-22 ENCOUNTER — Ambulatory Visit (INDEPENDENT_AMBULATORY_CARE_PROVIDER_SITE_OTHER): Payer: Medicare Other | Admitting: Family Medicine

## 2020-05-22 VITALS — BP 134/88 | HR 74 | Temp 97.9°F | Ht 65.0 in | Wt 159.0 lb

## 2020-05-22 DIAGNOSIS — R519 Headache, unspecified: Secondary | ICD-10-CM | POA: Diagnosis not present

## 2020-05-22 DIAGNOSIS — Z8679 Personal history of other diseases of the circulatory system: Secondary | ICD-10-CM | POA: Diagnosis not present

## 2020-05-22 DIAGNOSIS — H8110 Benign paroxysmal vertigo, unspecified ear: Secondary | ICD-10-CM | POA: Diagnosis not present

## 2020-05-22 DIAGNOSIS — R42 Dizziness and giddiness: Secondary | ICD-10-CM | POA: Diagnosis not present

## 2020-05-22 MED ORDER — MECLIZINE HCL 25 MG PO TABS: 25.0000 mg | ORAL_TABLET | Freq: Three times a day (TID) | ORAL | 0 refills | Status: DC | PRN

## 2020-05-27 ENCOUNTER — Encounter: Payer: Self-pay | Admitting: Neurology

## 2020-06-04 ENCOUNTER — Other Ambulatory Visit: Payer: Medicare Other

## 2020-06-08 ENCOUNTER — Other Ambulatory Visit: Payer: Medicare Other

## 2020-06-08 DIAGNOSIS — Z20822 Contact with and (suspected) exposure to covid-19: Secondary | ICD-10-CM

## 2020-06-09 LAB — SARS-COV-2, NAA 2 DAY TAT

## 2020-06-09 LAB — NOVEL CORONAVIRUS, NAA: SARS-CoV-2, NAA: NOT DETECTED

## 2020-06-10 ENCOUNTER — Telehealth (INDEPENDENT_AMBULATORY_CARE_PROVIDER_SITE_OTHER): Payer: Medicare Other | Admitting: Physician Assistant

## 2020-06-10 ENCOUNTER — Encounter: Payer: Self-pay | Admitting: Physician Assistant

## 2020-06-10 ENCOUNTER — Other Ambulatory Visit: Payer: Self-pay

## 2020-06-10 VITALS — HR 64 | Temp 97.7°F

## 2020-06-10 DIAGNOSIS — Z20822 Contact with and (suspected) exposure to covid-19: Secondary | ICD-10-CM

## 2020-06-10 DIAGNOSIS — R5383 Other fatigue: Secondary | ICD-10-CM | POA: Diagnosis not present

## 2020-06-10 DIAGNOSIS — R519 Headache, unspecified: Secondary | ICD-10-CM | POA: Diagnosis not present

## 2020-06-10 LAB — POCT INFLUENZA A/B
Influenza A, POC: NEGATIVE
Influenza B, POC: NEGATIVE

## 2020-06-10 NOTE — Patient Instructions (Signed)
Flu test was negative (CMA to call patient). Most likely has COVID-19, again, pending results. Please treat supportively at this time with fluids, rest, Tylenol, Vitamin C, D, and Zinc. ER if acutely worsening symptoms.

## 2020-06-10 NOTE — Progress Notes (Signed)
I have discussed the procedure for the virtual visit with the patient who has given consent to proceed with assessment and treatment.   Amanda [name] Amanda [name], CMA     

## 2020-06-10 NOTE — Progress Notes (Signed)
Virtual Visit via Telephone Note  I connected with Amanda [name] on 06/10/20 at  1:00 PM EST by telephone and verified that I am speaking with the correct person using two identifiers.  Location: Patient: home Provider: office   I discussed the limitations, risks, security and privacy concerns of performing an evaluation and management service by telephone and the availability of in person appointments. I also discussed with the patient that there may be a patient responsible charge related to this service. The patient expressed understanding and agreed to proceed.   History of Present Illness:  Pt is a 70 yo female presenting via phone visit for 5 days of "COVID-like" symptoms. States sx's started with chills, fatigue, and sinus issues. She came to be tested two days ago for COVID and had a negative test, but feels uncertain about the method of collection. Today she is very fatigued and has a headache. Still some aching. She has not had Tylenol. She has had all COVID shots plus booster, but not a flu shot. Son was here two weeks ago with sinus issues.  Observations/Objective: 97.7 degrees F  98% SpO2 64 bpm  All pt reported  Speaking clearly in full sentences without pauses or gasps.   Assessment and Plan:  Suspect COVID-19 infection, maybe influenza. Recommend testing for both. Patient is going to come to our clinic's parking lot today and test for both.  Follow Up Instructions:    I discussed the assessment and treatment plan with the patient. The patient was provided an opportunity to ask questions and all were answered. The patient agreed with the plan and demonstrated an understanding of the instructions.   The patient was advised to call back or seek an in-person evaluation if the symptoms worsen or if the condition fails to improve as anticipated.  I provided 10 minutes of non-face-to-face time during this encounter.   Kiora Hallberg M Tereza Gilham, PA-C

## 2020-06-12 LAB — SARS-COV-2, NAA 2 DAY TAT

## 2020-06-12 LAB — NOVEL CORONAVIRUS, NAA: SARS-CoV-2, NAA: NOT DETECTED

## 2020-06-15 ENCOUNTER — Telehealth: Payer: Self-pay

## 2020-06-15 DIAGNOSIS — R5383 Other fatigue: Secondary | ICD-10-CM | POA: Diagnosis not present

## 2020-06-15 DIAGNOSIS — E860 Dehydration: Secondary | ICD-10-CM | POA: Diagnosis not present

## 2020-06-15 NOTE — Telephone Encounter (Signed)
Nurse Assessment Nurse: Hardin Negus, RN, Mardene Celeste Date/Time Eilene Ghazi Time): 06/14/2020 4:15:59 PM Confirm and document reason for call. If symptomatic, describe symptoms. ---Caller states she has had a few COVID test and they were negative. She states she is dehydrated and weak. She was freezing, fatigued, headache that's why she thought it was covid. She is very light headed, having trouble walking. She does not want to go the ER is a 6 hour long wait Does the patient have any new or worsening symptoms? ---Yes Will a triage be completed? ---Yes Related visit to physician within the last 2 weeks? ---No Does the PT have any chronic conditions? (i.e. diabetes, asthma, this includes High risk factors for pregnancy, etc.) ---Yes List chronic conditions. ---RLS, celiac, Is this a behavioral health or substance abuse call? ---No Guidelines Guideline Title Affirmed Question Affirmed Notes Nurse Date/Time (Eastern Time) Headache [1] New headache AND [2] age > 68 Oren Bracket 06/14/2020 4:21:29 PM Disp. Time Eilene Ghazi Time) Disposition Final User 06/14/2020 4:28:18 PM See PCP within 24 Hours Yes Hardin Negus, RN, Mardene Celeste PLEASE NOTE: All timestamps contained within this report are represented as [place] Standard Time. CONFIDENTIALTY NOTICE: This fax transmission is intended only for the addressee. It contains information that is legally privileged, confidential or otherwise protected from use or disclosure. If you are not the intended recipient, you are strictly prohibited from reviewing, disclosing, copying using or disseminating any of this information or taking any action in reliance on or regarding this information. If you have received this fax in error, please notify us immediately by telephone so that we can arrange for its return to [place]. Phone: [phone], Toll-Free: [phone], Fax: [phone] Page: 2 of 2 Call Id: [id number] [place] Disagree/Comply Comply Caller Understands  Yes PreDisposition Call Doctor Care Advice Given Per Guideline SEE PCP WITHIN 24 HOURS: CARE ADVICE given per Headache (Adult) guideline. PAIN MEDICINES: REST: CALL BACK IF: * Blurred vision or double vision occurs * You become worse * Difficulty with speaking * Numbness or weakness of the face, arm or leg Comments User: Ledora Bottcher, RN Date/Time (Eastern Time): 06/14/2020 4:24:02 PM 97.1 , fh Referrals REFERRED TO PCP OFFIC

## 2020-06-15 NOTE — Telephone Encounter (Signed)
LVM asking patient to call back.

## 2020-06-15 NOTE — Telephone Encounter (Signed)
Schedule virtual with available provider since Dr. Yong Channel is out of office today.

## 2020-06-17 NOTE — Telephone Encounter (Signed)
Spoke with patient today to check on her current symptoms. Advised her of negative covid test. She states she went to [facility] on Monday 06/15/20 for IV fluids. She was dehydrated. She was tested again for Covid there and was negative. Her symptoms did mimic covid. She is feeling much better since getting the IV fluids.

## 2020-07-02 DIAGNOSIS — M1712 Unilateral primary osteoarthritis, left knee: Secondary | ICD-10-CM | POA: Diagnosis not present

## 2020-07-06 ENCOUNTER — Other Ambulatory Visit: Payer: Self-pay

## 2020-07-06 ENCOUNTER — Encounter (HOSPITAL_BASED_OUTPATIENT_CLINIC_OR_DEPARTMENT_OTHER): Payer: Self-pay | Admitting: Physical Therapy

## 2020-07-06 ENCOUNTER — Ambulatory Visit (HOSPITAL_BASED_OUTPATIENT_CLINIC_OR_DEPARTMENT_OTHER): Payer: Medicare Other | Attending: Family Medicine | Admitting: Physical Therapy

## 2020-07-06 DIAGNOSIS — R2681 Unsteadiness on feet: Secondary | ICD-10-CM

## 2020-07-06 DIAGNOSIS — R293 Abnormal posture: Secondary | ICD-10-CM | POA: Insufficient documentation

## 2020-07-06 DIAGNOSIS — R42 Dizziness and giddiness: Secondary | ICD-10-CM | POA: Diagnosis not present

## 2020-07-06 DIAGNOSIS — M542 Cervicalgia: Secondary | ICD-10-CM
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
## 2020-07-06 NOTE — Patient Instructions (Signed)
Access Code: [email or web address] URL: [email or web address] Date: 07/06/2020 Prepared by: Estill Bamberg April Thurnell Garbe  Exercises Upper Trap Stretch with Belt - 1 x daily - 7 x weekly - 1 sets - 5 reps - 5-10 sec hold hold Seated [place] Stretch with Towel - 1 x daily - 7 x weekly - 1 sets - 5 reps - 5-10 sec hold

## 2020-07-07 ENCOUNTER — Ambulatory Visit (INDEPENDENT_AMBULATORY_CARE_PROVIDER_SITE_OTHER): Payer: Medicare Other | Admitting: Obstetrics and Gynecology

## 2020-07-07 ENCOUNTER — Encounter: Payer: Self-pay | Admitting: Obstetrics and Gynecology

## 2020-07-07 ENCOUNTER — Other Ambulatory Visit: Payer: Self-pay

## 2020-07-07 VITALS — BP 120/80 | HR 72 | Wt 157.0 lb

## 2020-07-07 DIAGNOSIS — M545 Low back pain, unspecified: Secondary | ICD-10-CM

## 2020-07-07 DIAGNOSIS — R1031 Right lower quadrant pain: Secondary | ICD-10-CM

## 2020-07-07 LAB — URINALYSIS, COMPLETE W/RFL CULTURE
Bacteria, UA: NONE SEEN /HPF
Bilirubin Urine: NEGATIVE
Glucose, UA: NEGATIVE
Hgb urine dipstick: NEGATIVE
Hyaline Cast: NONE SEEN /LPF
Ketones, ur: NEGATIVE
Leukocyte Esterase: NEGATIVE
Nitrites, Initial: NEGATIVE
Protein, ur: NEGATIVE
RBC / HPF: NONE SEEN /HPF (ref 0–2)
Specific Gravity, Urine: 1.015 (ref 1.001–1.03)
WBC, UA: NONE SEEN /HPF (ref 0–5)
pH: 7 (ref 5.0–8.0)

## 2020-07-07 LAB — NO CULTURE INDICATED

## 2020-07-07 NOTE — Progress Notes (Signed)
Amanda [name] 03-27-1951 [id number]  SUBJECTIVE:  70 y.o. [phone] female presents for evaluation of right lower quadrant pain and back pain in the past 2 weeks.  Sounds to be 2 separate pain.  Right lower quadrant pain is isolated to the right lower abdomen/pelvis without radiation.  Has a low mid back pain, no flank pain.  Pain started 2 weeks ago with an achy and throbbing sensation fairly constant and can go from dull to more achy, intensity 6 out of 10 sporadically.  Does not appear to be related to movement, activity, or eating.  Has tried a heating pad over the back which helps that discomfort.  Has not tried any remedies for the abdominal/pelvic pain.  History of chronic constpation but daily bowel movements, but she does have to strain to pass a bowel movement.  Uses Gaviscon and Dulcolax.  No dark or bloody stool.  Colonoscopy in 2020. She had a prior hysterectomy and also a wide local excision for VIN 3 with positive margin in 03/2019, follow up biopsy of left vulva 05/2019 showed VIN 2-3.  Followed by Dr. Denman George.  Underwent resection of the vaginal lesion 06/2019.  Normal vaginal Pap smear 11/2019.  No vaginal bleeding or discharge at this time.  Somewhat chronic urinary urgency but no acute changes.  No dysuria/painful urination.    Current Outpatient Medications  Medication Sig Dispense Refill  . Alum Hydroxide-Mag Carbonate (GAVISCON PO) Take by mouth.    . APPLE CIDER VINEGAR PO Take by mouth 2 (two) times daily with a meal. Pt takes 2 capsules with each meal. Pt only eats 2 meals. Total of 4 capsules per day.    [name] aspirin EC 81 MG tablet Take 81 mg by mouth daily.    . bisacodyl (DULCOLAX) 5 MG EC tablet Take 5 mg by mouth daily as needed for moderate constipation.    . Black Currant Seed Oil 500 MG CAPS Take by mouth daily.     . Calcium Carbonate (CALCIUM 500 PO) Take by mouth daily.    . cholecalciferol (VITAMIN D3) 25 MCG (1000 UT) tablet Take 1,000 Units by mouth daily. Take 2  in the am takes with magnesium    . clonazePAM (KLONOPIN) 0.5 MG tablet Take 1 tablet (0.5 mg total) by mouth at bedtime as needed for anxiety. (Patient taking differently: Take 0.5 mg by mouth daily.) 90 tablet 1  . CO ENZYME Q-10 PO Take by mouth daily.     . Cyanocobalamin (VITAMIN B-12 ER PO) Take 5,000 mcg by mouth daily.    . diclofenac sodium (VOLTAREN) 1 % GEL APPLY TOPICALLY TO AFFECTED AREA 4 TIMES A DAY 100 g 1  . escitalopram (LEXAPRO) 10 MG tablet Take 10 mg by mouth daily.    . meclizine (ANTIVERT) 25 MG tablet Take 1 tablet (25 mg total) by mouth 3 (three) times daily as needed for dizziness. 30 tablet 0  . Melatonin 3 MG CAPS Take by mouth at bedtime as needed.     . metoprolol succinate (TOPROL-XL) 25 MG 24 hr tablet TAKE 1 TABLET BY MOUTH EVERY DAY 90 tablet 3  . Multiple Vitamin (MULTIVITAMIN) tablet Take 1 tablet by mouth daily.    . NON FORMULARY Bausch and lomb ointment-Muro-//128, 5%ointment- Each eye nightly    . rOPINIRole (REQUIP) 0.25 MG tablet TAKE 1 TABLET BY MOUTH AT BEDTIME 90 tablet 3  . rosuvastatin (CRESTOR) 5 MG tablet Take 1 tablet (5 mg total) by mouth daily. 90 tablet  3  . valACYclovir (VALTREX) 500 MG tablet One tab bid as needed for outbreak 180 tablet 1   No current facility-administered medications for this visit.   Allergies: Ambien [zolpidem tartrate], Cortisone, Eszopiclone and related, Levofloxacin, Dicloxacillin, Dilantin [phenytoin sodium extended], Erythromycin, Avelox [moxifloxacin hcl in nacl], Influenza vaccines, Other, Nitrofuran derivatives, Phenytoin sodium extended, and Trazodone and nefazodone  No LMP recorded (lmp unknown). Patient has had a hysterectomy.  Past medical history,surgical history, problem list, medications, allergies, family history and social history were all reviewed and documented as reviewed in the EPIC chart.  ROS: Pertinent positives and negatives as reviewed in HPI    OBJECTIVE:  BP 120/80   Pulse 72   Wt 157  lb (71.2 kg)   LMP  (LMP Unknown)   BMI 26.13 kg/m  The patient appears well, alert, oriented, in no distress. BACK: No right-sided CVA or flank tenderness ABDOMEN: Soft, nondistended, tenderness to deep palpation in the right lower quadrant PELVIC EXAM: VULVA: normal appearing vulva with no masses, tenderness or lesions, VAGINA: normal appearing vagina with normal color and discharge, no lesions, CERVIX: surgically absent, UTERUS: surgically absent, vaginal cuff normal, ADNEXA: normal adnexa in size, no palpable masses, pain with deep palpation in the right pelvis. Chaperone: Melena Hayes present during the examination   ASSESSMENT:  70 y.o. [phone] here for evaluation of right lower quadrant pain and low back pain  PLAN:  Differential diagnosis includes ovarian cyst, constipation-related bloating/IBS, urinary tract infection, musculoskeletal pain, unlikely to be appendicitis or kidney stone.  I feel her back pain is probably musculoskeletal pain. She was unable to leave enough urine initially to run a urinalysis so we will have her more water and try to leave a sample before leaving the office today.  Given the tenderness on her examination I would like her to get a pelvic ultrasound to more definitively rule out a right adnexal cysts although I cannot palpate any on the exam today.  She understands she will be following up with another provider in the group as I will no longer be at this practice after this week.   Joseph Pierini MD 07/07/20

## 2020-07-08 DIAGNOSIS — L821 Other seborrheic keratosis: Secondary | ICD-10-CM | POA: Diagnosis not present

## 2020-07-08 DIAGNOSIS — D220 Melanocytic nevi of lip: Secondary | ICD-10-CM | POA: Diagnosis not present

## 2020-07-08 DIAGNOSIS — D2272 Melanocytic nevi of left lower limb, including hip: Secondary | ICD-10-CM | POA: Diagnosis not present

## 2020-07-08 DIAGNOSIS — D2271 Melanocytic nevi of right lower limb, including hip: Secondary | ICD-10-CM | POA: Diagnosis not present

## 2020-07-08 DIAGNOSIS — D2261 Melanocytic nevi of right upper limb, including shoulder: Secondary | ICD-10-CM | POA: Diagnosis not present

## 2020-07-08 DIAGNOSIS — D2262 Melanocytic nevi of left upper limb, including shoulder: Secondary | ICD-10-CM | POA: Diagnosis not present

## 2020-07-08 DIAGNOSIS — D225 Melanocytic nevi of trunk: Secondary | ICD-10-CM | POA: Diagnosis not present

## 2020-07-08 DIAGNOSIS — L812 Freckles: Secondary | ICD-10-CM | POA: Diagnosis not present

## 2020-07-08 DIAGNOSIS — Z85828 Personal history of other malignant neoplasm of skin: Secondary | ICD-10-CM | POA: Diagnosis not present

## 2020-07-08 DIAGNOSIS — M1712 Unilateral primary osteoarthritis, left knee: Secondary | ICD-10-CM | POA: Diagnosis not present

## 2020-07-08 DIAGNOSIS — D1801 Hemangioma of skin and subcutaneous tissue: Secondary | ICD-10-CM | POA: Diagnosis not present

## 2020-07-09 ENCOUNTER — Ambulatory Visit (INDEPENDENT_AMBULATORY_CARE_PROVIDER_SITE_OTHER): Payer: Medicare Other | Admitting: Obstetrics and Gynecology

## 2020-07-09 ENCOUNTER — Encounter: Payer: Self-pay | Admitting: Obstetrics and Gynecology

## 2020-07-09 ENCOUNTER — Ambulatory Visit (INDEPENDENT_AMBULATORY_CARE_PROVIDER_SITE_OTHER): Payer: Medicare Other

## 2020-07-09 ENCOUNTER — Other Ambulatory Visit: Payer: Self-pay

## 2020-07-09 VITALS — BP 120/72 | HR 79 | Wt 157.0 lb

## 2020-07-09 DIAGNOSIS — R1031 Right lower quadrant pain: Secondary | ICD-10-CM

## 2020-07-09 DIAGNOSIS — R102 Pelvic and perineal pain: Secondary | ICD-10-CM | POA: Diagnosis not present

## 2020-07-09 DIAGNOSIS — N83201 Unspecified ovarian cyst, right side: Secondary | ICD-10-CM

## 2020-07-09 NOTE — Progress Notes (Signed)
   YEHUDIT FULGINITI [birth year] [id number]  SUBJECTIVE:  70 y.o. [id number] female presents for pelvic ultrasound to further evaluate intermittent right lower quadrant pain.  Past medical history,surgical history, problem list, medications, allergies, family history and social history were all reviewed and documented as reviewed in the EPIC chart.   OBJECTIVE:  BP 120/72   Pulse 79   Wt 157 lb (71.2 kg)   LMP  (LMP Unknown)   SpO2 97%   BMI 26.13 kg/m   Pelvic ultrasound Surgically absent uterus.  Normal vaginal cuff. Right ovary with simple thin-walled 16 x 13 mm avascular cyst. Left ovary not well visualized due to overlying bowel. No adnexal masses.  No free fluid.  ASSESSMENT:  70 y.o. [id number] with small persistent simple right ovarian cyst not likely the cause of right lower quadrant pain  PLAN:  Discussed that this is a persistent right ovarian cyst and was seen on multiple previous studies including most recently 02/2019.  Essentially the same size and has remained simple in appearance so could consider following it annually on ultrasound.  Given that the cyst is so small I do not think that this is the cause of the acute symptoms, especially since it has been there for quite some time. Previous ultrasound in 2018 had mentioned excessive intestinal activity in the right lower quadrant in the past.  If she has ongoing symptoms and especially if they worsen then she should see her gastroenterologist.   Joseph Pierini MD 07/09/20

## 2020-07-13 ENCOUNTER — Ambulatory Visit (HOSPITAL_BASED_OUTPATIENT_CLINIC_OR_DEPARTMENT_OTHER): Payer: Medicare Other | Admitting: Physical Therapy

## 2020-07-15 DIAGNOSIS — M1712 Unilateral primary osteoarthritis, left knee: Secondary | ICD-10-CM | POA: Diagnosis not present

## 2020-07-21 ENCOUNTER — Ambulatory Visit (HOSPITAL_BASED_OUTPATIENT_CLINIC_OR_DEPARTMENT_OTHER): Payer: Medicare Other | Attending: Family Medicine | Admitting: Physical Therapy

## 2020-07-21 ENCOUNTER — Other Ambulatory Visit: Payer: Self-pay

## 2020-07-21 DIAGNOSIS — R293 Abnormal posture: Secondary | ICD-10-CM

## 2020-07-21 DIAGNOSIS — R2681 Unsteadiness on feet: Secondary | ICD-10-CM | POA: Diagnosis not present

## 2020-07-21 DIAGNOSIS — R42 Dizziness and giddiness: Secondary | ICD-10-CM | POA: Diagnosis not present

## 2020-07-21 DIAGNOSIS — M542 Cervicalgia: Secondary | ICD-10-CM
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
## 2020-07-27 ENCOUNTER — Encounter: Payer: Self-pay | Admitting: Nurse Practitioner

## 2020-07-27 ENCOUNTER — Ambulatory Visit (INDEPENDENT_AMBULATORY_CARE_PROVIDER_SITE_OTHER): Payer: Medicare Other | Admitting: Nurse Practitioner

## 2020-07-27 ENCOUNTER — Ambulatory Visit (HOSPITAL_BASED_OUTPATIENT_CLINIC_OR_DEPARTMENT_OTHER): Payer: Medicare Other | Admitting: Physical Therapy

## 2020-07-27 ENCOUNTER — Other Ambulatory Visit: Payer: Self-pay

## 2020-07-27 VITALS — BP 116/62 | HR 68 | Temp 97.8°F | Resp 16 | Wt 157.0 lb

## 2020-07-27 DIAGNOSIS — M542 Cervicalgia: Secondary | ICD-10-CM

## 2020-07-27 DIAGNOSIS — R350 Frequency of micturition: Secondary | ICD-10-CM

## 2020-07-27 DIAGNOSIS — R2681 Unsteadiness on feet: Secondary | ICD-10-CM | POA: Diagnosis not present

## 2020-07-27 DIAGNOSIS — R42 Dizziness and giddiness: Secondary | ICD-10-CM

## 2020-07-27 DIAGNOSIS — N362 Urethral caruncle: Secondary | ICD-10-CM | POA: Diagnosis not present

## 2020-07-27 DIAGNOSIS — R293 Abnormal posture: Secondary | ICD-10-CM

## 2020-07-27 LAB — URINALYSIS, COMPLETE W/RFL CULTURE
Bacteria, UA: NONE SEEN /HPF
Bilirubin Urine: NEGATIVE
Glucose, UA: NEGATIVE
Hgb urine dipstick: NEGATIVE
Hyaline Cast: NONE SEEN /LPF
Ketones, ur: NEGATIVE
Leukocyte Esterase: NEGATIVE
Nitrites, Initial: NEGATIVE
Protein, ur: NEGATIVE
RBC / HPF: NONE SEEN /HPF (ref 0–2)
Specific Gravity, Urine: 1.015 (ref 1.001–1.03)
WBC, UA: NONE SEEN /HPF (ref 0–5)
pH: 7.5 (ref 5.0–8.0)

## 2020-07-27 LAB — NO CULTURE INDICATED

## 2020-07-27 LAB — WET PREP FOR TRICH, YEAST, CLUE

## 2020-07-27 MED ORDER — ESTRADIOL 0.1 MG/GM VA CREA: TOPICAL_CREAM | VAGINAL | 0 refills | Status: DC

## 2020-07-27 NOTE — Patient Instructions (Addendum)
Urethral caruncle  What are the symptoms of a urethral caruncle? Urethral caruncles in 32% of cases are asymptomatic. When present, the most common symptoms are dysuria, pain or discomfort, dyspareunia, and rarely bleeding. The mass may be large and bleeds easily.
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
## 2020-07-27 NOTE — Progress Notes (Signed)
[facility]  VISIT  CC:   Back hurts, frequent urination, feeling run down, pinkish brown area on toilet paper. Was evalauted by Dr. Delilah Shan on 07/09/2020 and nothing came of it. Now notices pinkish discharge.  HPI: 70 y.o. G16P2002 Married White or Caucasian female here for uti.   She is not sexually active at this time.   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 Patient has had a hysterectomy. Contraception: hysterectomy Menopausal hormone therapy: none  Patient Active Problem List   Diagnosis Date Noted  . Elevated blood pressure reading 03/06/2020  . Pharyngoesophageal dysphagia 12/23/2019  . 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 Pain of left calf 09/27/2019  . Pain in left knee 09/24/2019  . 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 History of subdural hematoma 03/26/2019  . 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 History of Guillain-Barre syndrome 04/01/2018  . Hyperlipidemia with target LDL less than 100 03/30/2018  . Mastalgia, left, followed by GYN 03/29/2018  . Fibromyalgia 03/29/2018  . Fuchs' corneal dystrophy, 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  . Insulin resistance 03/29/2018  . History of hysterectomy 03/29/2018  . Grade I diastolic dysfunction 74/94/4967  . Thyroid nodule 03/27/2018  . Presbycusis of both ears 03/10/2017  . Dysthymia 09/26/2016  . Palpitations 01/17/2013  . GERD (gastroesophageal reflux disease)   . Restless leg syndrome, on Requip   . Allergic rhinitis 06/25/2009  . Diaphragmatic hernia 02/16/2009  . IBS (irritable bowel syndrome) 02/16/2009  . Celiac disease 11/27/2007  . Insomnia 07/05/2007  . Osteoarthritis 07/05/2007  . Osteopenia 10/03/2006  . Left carotid bruit 10/03/2006  . History of thyroid cancer 10/03/2006    Past Medical History:  Diagnosis Date  . Anxiety   . Celiac disease   . Chronic constipation   . COVID-19 virus vaccine not  available   . GERD (gastroesophageal reflux disease)    occasionally (per pt takes apple cider vinager)  . Heart palpitations    cardiologist-- dr Ellyn Hack--- event monitor 08-24-2011 epic ;  nuclear stress test 03-16-2011 (epic) normal w/ no ischemia, ef 65%;  echo 01-18-2013  ef 60-65%, G1DD  . Hiatal hernia   . History of subdural hematoma    12/ 23/ 2010  s/p  craniotomy w/ hematoma evacuation (pt had a fall w/ concussion)  per pt no residual  . History of thyroid cancer followed by dr Constance Holster---   s/p  left thyroidectomy --- per pt no radiation and no recurrence  . Hyperlipidemia   . Immunization, single disease 06/09/2019   1st dose moderma vaccine administered  . Insomnia   . OA (osteoarthritis)   . Osteopenia, T score -2.1 FRAX 9.4%/1.3% stable from prior DEXA 06/2015  . Restless leg syndrome   . Right thyroid nodule    followed by dr Constance Holster--- last ultrasound in epic 03-30-2018 stable , to bx  . STD (sexually transmitted disease), HSV   . 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 Wears glasses     Past Surgical History:  Procedure Laterality Date  . ABDOMINAL HYSTERECTOMY  1986   ovaries remain  . BREAST REDUCTION SURGERY Bilateral 1999  . CATARACT EXTRACTION W/ INTRAOCULAR LENS  IMPLANT, BILATERAL  2019  . COLONOSCOPY  last one 11-22-2018  . ESOPHAGOGASTRODUODENOSCOPY    . KNEE ARTHROSCOPY Bilateral 2002  . SUBDURAL HEMATOMA EVACUATION VIA CRANIOTOMY  04-30-2009  @[facility]    left frontotemporapartietal   . THYROID  LOBECTOMY Left 05-04-2001  @[facility]   dr Constance Holster  . VULVECTOMY N/A 03/12/2019   Procedure: WIDE LOCAL EXCISION OF CARCINOMA IS SITU OF VULVA;  Surgeon: Anastasio Auerbach, MD;  Location: [place];  Service: Gynecology;  Laterality: N/A;  request to follow in [place] block time requests one hour colposcope available in OR with acetic acid.  [name] N/A 06/17/2019   Procedure: WIDE LOCAL  EXCISION VULVECTOMY;  Surgeon: Everitt Amber,  MD;  Location: [facility];  Service: Gynecology;  Laterality: N/A;    MEDS:   Current Outpatient Medications on File Prior to Visit  Medication Sig Dispense Refill  . Alum Hydroxide-Mag Carbonate (GAVISCON PO) Take by mouth.    . APPLE CIDER VINEGAR PO Take by mouth 2 (two) times daily with a meal. Pt takes 2 capsules with each meal. Pt only eats 2 meals. Total of 4 capsules per day.    [name] aspirin EC 81 MG tablet Take 81 mg by mouth daily.    . bisacodyl (DULCOLAX) 5 MG EC tablet Take 5 mg by mouth daily as needed for moderate constipation.    . Black Currant Seed Oil 500 MG CAPS Take by mouth daily.     . Calcium Carbonate (CALCIUM 500 PO) Take by mouth daily.    [name] CALCIUM PO Take by mouth. Takes 2    . cholecalciferol (VITAMIN D3) 25 MCG (1000 UT) tablet Take 1,000 Units by mouth daily.    . clonazePAM (KLONOPIN) 0.5 MG tablet Take 1 tablet (0.5 mg total) by mouth at bedtime as needed for anxiety. (Patient taking differently: Take 0.5 mg by mouth daily.) 90 tablet 1  . CO ENZYME Q-10 PO Take by mouth daily.     . Cyanocobalamin (VITAMIN B-12 ER PO) Take 5,000 mcg by mouth daily.    . diclofenac sodium (VOLTAREN) 1 % GEL APPLY TOPICALLY TO AFFECTED AREA 4 TIMES A DAY 100 g 1  . escitalopram (LEXAPRO) 10 MG tablet Take 10 mg by mouth daily.    . Melatonin 3 MG CAPS Take by mouth at bedtime as needed.     . metoprolol succinate (TOPROL-XL) 25 MG 24 hr tablet TAKE 1 TABLET BY MOUTH EVERY DAY 90 tablet 3  . Multiple Vitamin (MULTIVITAMIN) tablet Take 1 tablet by mouth daily.    . NON FORMULARY Bausch and lomb ointment-Muro-//128, 5%ointment- Each eye nightly    . rOPINIRole (REQUIP) 0.25 MG tablet TAKE 1 TABLET BY MOUTH AT BEDTIME 90 tablet 3  . rosuvastatin (CRESTOR) 5 MG tablet Take 1 tablet (5 mg total) by mouth daily. 90 tablet 3  . valACYclovir (VALTREX) 500 MG tablet One tab bid as needed for outbreak 180 tablet 1   No current facility-administered medications on file  prior to visit.    ALLERGIES: Ambien [zolpidem tartrate], Cortisone, Eszopiclone and related, Levofloxacin, Dicloxacillin, Dilantin [phenytoin sodium extended], Erythromycin, Avelox [moxifloxacin hcl in nacl], Influenza vaccines, Other, Nitrofuran derivatives, Phenytoin sodium extended, and Trazodone and nefazodone  Family History  Problem Relation Age of Onset  . Diabetes Maternal Aunt   . Breast cancer Maternal Aunt 60  . Liver cancer Father        alcohol related likely  . Failure to thrive Mother        102  . Heart disease Maternal Grandfather   . Hyperlipidemia Maternal Grandfather   . Hypertension Paternal Grandfather   . Colon cancer Neg Hx   . Thyroid disease Neg Hx  Review of Systems  Constitutional: Negative.   HENT: Negative.   Eyes: Negative.   Respiratory: Negative.   Cardiovascular: Negative.   Gastrointestinal: Negative.   Endocrine: Negative.   Genitourinary: Positive for frequency.       Fatigue, Back pain, pink on toilet tissue  Musculoskeletal: Negative.   Skin: Negative.   Allergic/Immunologic: Negative.   Neurological: Negative.   Hematological: Negative.   Psychiatric/Behavioral: Negative.     PHYSICAL EXAMINATION:    BP 116/62   Pulse 68   Temp 97.8 F (36.6 C) (Oral)   Resp 16   Wt 157 lb (71.2 kg)   LMP  (LMP Unknown)   BMI 26.13 kg/m     General appearance: alert, cooperative, no acute distress  Pelvic: External genitalia:  Hx: vulvectomy, no abnormal findings today              Urethra:  Urethral caruncle noted              Bartholins and Skenes: normal                 Vagina: normal appearing vagina               Cervix: absent              Bimanual Exam:  Uterus:  uterus absent              Adnexa: no mass, fullness, tenderness              Wet mount: Neg clue, neg yeast, neg trich UA: WNL, neg nitrites, neg protein, neg WBC  Assessment/Plan:  Urinary frequency - Plan: Urinalysis,Complete w/RFL Culture, WET PREP FOR  TRICH, YEAST, CLUE  Urethral caruncle - Plan: estradiol (ESTRACE) 0.1 MG/GM vaginal cream

## 2020-08-03 NOTE — Progress Notes (Addendum)
NEUROLOGY CONSULTATION NOTE  Amanda [name] MRN: [id number] DOB: 30-May-1950  Referring provider: Garret Reddish, MD Primary care provider: Garret Reddish, MD  Reason for consult:  dizziness  Assessment/Plan:   Vertigo - diagnosed with cervicogenic vertigo given history of chronic neck pain.  Responded well to PT.  However, given her history of SDH and concern for a secondary intracranial etiology, she would like further evaluation  1.  MRI of brain without contrast 2.  Further recommendation pending results.  08/20/2020 ADDENDUM:  MRI of brain stable compared to 2016.  Vertigo likely cervicogenic.  Other than physical therapy, no further recommendations. Amanda Clines, DO   Subjective:  Amanda [name] is a 70 year old female who presents for dizziness.  History supplemented by PT and referring provider's notes.  In 2010, she sustained a traumatic subdural hematoma.  I saw Ms. Orton in 2016.  She had an episode of vertigo/spinning lasting a few seconds upon bending forward.  Symptoms appeared consistent with BPPV.  ENT agreed and was instructed on the Epley maneuver.  She then experienced a left-sided pressure-like headache (on side of her subdural) in September 2016.  She went to the ED for further evaluation. MRI of the brain with and without contrast (personally reviewed) showed sequelae of prior left craniotomy but no acute abnormality.    In 2017, she reported episodes of electric shooting pain over her left occipital region (again, the side of her SDH), consistent with occipital neuralgia.  She reported at the time that it was intermittent and infrequent.  Neck massage helpful.  In October-November 2021, she began experiencing a different dizziness.  While walking, she found herself unable to walk straight, veering to either side.  She described the feeling as a spinning sensation with nausea.  She can feel it if she is laying in her [place].  She tried performing the  Epley maneuver which provided some benefit.  She has been going to physical therapy and was diagnosed cervicogenic vertigo.  Physical therapy has been very effective.  However, given her past history of SDH, she is concerned that something else, such as stroke, may have happened.   PAST MEDICAL HISTORY: Past Medical History:  Diagnosis Date  . Anxiety   . Celiac disease   . Chronic constipation   . COVID-19 virus vaccine not available   . GERD (gastroesophageal reflux disease)    occasionally (per pt takes apple cider vinager)  . Heart palpitations    cardiologist-- dr Ellyn Hack--- event monitor 08-24-2011 epic ;  nuclear stress test 03-16-2011 (epic) normal w/ no ischemia, ef 65%;  echo 01-18-2013  ef 60-65%, G1DD  . Hiatal hernia   . History of subdural hematoma    12/ 23/ 2010  s/p  craniotomy w/ hematoma evacuation (pt had a fall w/ concussion)  per pt no residual  . History of thyroid cancer followed by dr Constance Holster---   s/p  left thyroidectomy --- per pt no radiation and no recurrence  . Hyperlipidemia   . Immunization, single disease 06/09/2019   1st dose moderma vaccine administered  . Insomnia   . OA (osteoarthritis)   . Osteopenia, T score -2.1 FRAX 9.4%/1.3% stable from prior DEXA 06/2015  . Restless leg syndrome   . Right thyroid nodule    followed by dr Constance Holster--- last ultrasound in epic 03-30-2018 stable , to bx  . STD (sexually transmitted disease), HSV   . 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 Wears glasses  PAST SURGICAL HISTORY: Past Surgical History:  Procedure Laterality Date  . ABDOMINAL HYSTERECTOMY  1986   ovaries remain  . BREAST REDUCTION SURGERY Bilateral 1999  . CATARACT EXTRACTION W/ INTRAOCULAR LENS  IMPLANT, BILATERAL  2019  . COLONOSCOPY  last one 11-22-2018  . ESOPHAGOGASTRODUODENOSCOPY    . KNEE ARTHROSCOPY Bilateral 2002  . SUBDURAL HEMATOMA EVACUATION VIA CRANIOTOMY  04-30-2009  @[facility]    left frontotemporapartietal   .  THYROID LOBECTOMY Left 05-04-2001  @[facility]   dr Constance Holster  . VULVECTOMY N/A 03/12/2019   Procedure: WIDE LOCAL EXCISION OF CARCINOMA IS SITU OF VULVA;  Surgeon: Anastasio Auerbach, MD;  Location: [place];  Service: Gynecology;  Laterality: N/A;  request to follow in [place] block time requests one hour colposcope available in OR with acetic acid.  [name] N/A 06/17/2019   Procedure: WIDE LOCAL  EXCISION VULVECTOMY;  Surgeon: Everitt Amber, MD;  Location: [facility];  Service: Gynecology;  Laterality: N/A;    MEDICATIONS: Current Outpatient Medications on File Prior to Visit  Medication Sig Dispense Refill  . Alum Hydroxide-Mag Carbonate (GAVISCON PO) Take by mouth.    . APPLE CIDER VINEGAR PO Take by mouth 2 (two) times daily with a meal. Pt takes 2 capsules with each meal. Pt only eats 2 meals. Total of 4 capsules per day.    [name] aspirin EC 81 MG tablet Take 81 mg by mouth daily.    . bisacodyl (DULCOLAX) 5 MG EC tablet Take 5 mg by mouth daily as needed for moderate constipation.    . Black Currant Seed Oil 500 MG CAPS Take by mouth daily.     . Calcium Carbonate (CALCIUM 500 PO) Take by mouth daily.    [name] CALCIUM PO Take by mouth. Takes 2    . cholecalciferol (VITAMIN D3) 25 MCG (1000 UT) tablet Take 1,000 Units by mouth daily.    . clonazePAM (KLONOPIN) 0.5 MG tablet Take 1 tablet (0.5 mg total) by mouth at bedtime as needed for anxiety. (Patient taking differently: Take 0.5 mg by mouth daily.) 90 tablet 1  . CO ENZYME Q-10 PO Take by mouth daily.     . Cyanocobalamin (VITAMIN B-12 ER PO) Take 5,000 mcg by mouth daily.    . diclofenac sodium (VOLTAREN) 1 % GEL APPLY TOPICALLY TO AFFECTED AREA 4 TIMES A DAY 100 g 1  . escitalopram (LEXAPRO) 10 MG tablet Take 10 mg by mouth daily.    [name] estradiol (ESTRACE) 0.1 MG/GM vaginal cream Apply topically to the urethra daily x 14 days then twice weekly as needed 42.5 g 0  . Melatonin 3 MG CAPS Take by mouth at bedtime  as needed.     . metoprolol succinate (TOPROL-XL) 25 MG 24 hr tablet TAKE 1 TABLET BY MOUTH EVERY DAY 90 tablet 3  . Multiple Vitamin (MULTIVITAMIN) tablet Take 1 tablet by mouth daily.    . NON FORMULARY Bausch and lomb ointment-Muro-//128, 5%ointment- Each eye nightly    . rOPINIRole (REQUIP) 0.25 MG tablet TAKE 1 TABLET BY MOUTH AT BEDTIME 90 tablet 3  . rosuvastatin (CRESTOR) 5 MG tablet Take 1 tablet (5 mg total) by mouth daily. 90 tablet 3  . valACYclovir (VALTREX) 500 MG tablet One tab bid as needed for outbreak 180 tablet 1   No current facility-administered medications on file prior to visit.    ALLERGIES: Allergies  Allergen Reactions  . Ambien [Zolpidem Tartrate] Other (See Comments)    Causes Neurological problems  with very bad dizziness, pains, disorientation  . Cortisone Other (See Comments)    REACTION: "mania" post ESI  . Eszopiclone And Related Other (See Comments)    Causes Neurological problems with very bad dizziness, pains, disorientation  . Levofloxacin Other (See Comments)    Muscle spasms and jerking movements  . Dicloxacillin Other (See Comments)    Stomach problems, swelling of tongue  . Dilantin [Phenytoin Sodium Extended] Hives and Itching  . Erythromycin Other (See Comments)    Tears stomach up  . Avelox [Moxifloxacin Hcl In Nacl]     Pt unsure of reaction.  . Influenza Vaccines     Guillain Barre in the past- 20 years ago  . Other     Celiac disease  . Nitrofuran Derivatives Other (See Comments)    unknown  . Phenytoin Sodium Extended Rash  . Trazodone And Nefazodone Other (See Comments)    dizzy    FAMILY HISTORY: Family History  Problem Relation Age of Onset  . Diabetes Maternal Aunt   . Breast cancer Maternal Aunt 60  . Liver cancer Father        alcohol related likely  . Failure to thrive Mother        74  . Heart disease Maternal Grandfather   . Hyperlipidemia Maternal Grandfather   . Hypertension Paternal Grandfather   . Colon  cancer Neg Hx   . Thyroid disease Neg Hx     Objective:  Blood pressure (!) 156/80, pulse 78, height 5' 5"  (1.651 m), weight 158 lb (71.7 kg), SpO2 98 %. General: No acute distress.  Patient appears well-groomed.   Head:  Normocephalic/atraumatic Eyes:  fundi examined but not visualized Neck: supple, no paraspinal tenderness, full range of motion Back: No paraspinal tenderness Heart: regular rate and rhythm Lungs: Clear to auscultation bilaterally. Vascular: No carotid bruits. Neurological Exam: Mental status: alert and oriented to person, place, and time, recent and remote memory intact, fund of knowledge intact, attention and concentration intact, speech fluent and not dysarthric, language intact. Cranial nerves: CN I: not tested CN II: pupils equal, round and reactive to light, visual fields intact CN III, IV, VI:  full range of motion, no nystagmus, no ptosis CN V: facial sensation intact. CN VII: upper and lower face symmetric CN VIII: hearing intact CN IX, X: gag intact, uvula midline CN XI: sternocleidomastoid and trapezius muscles intact CN XII: tongue midline Bulk & Tone: normal, no fasciculations. Motor:  muscle strength 5/5 throughout Sensation:  Pinprick, temperature and vibratory sensation intact. Deep Tendon Reflexes:  2+ throughout,  toes downgoing.   Finger to nose testing:  Without dysmetria.   Heel to shin:  Without dysmetria.   Gait:  Normal station and stride.  Romberg negative.    Thank you for allowing me to take part in the care of this patient.  Amanda Clines, DO  CC: Garret Reddish, MD

## 2020-08-04 ENCOUNTER — Encounter: Payer: Self-pay | Admitting: Neurology

## 2020-08-04 ENCOUNTER — Telehealth: Payer: Self-pay | Admitting: *Deleted

## 2020-08-04 ENCOUNTER — Ambulatory Visit (HOSPITAL_BASED_OUTPATIENT_CLINIC_OR_DEPARTMENT_OTHER): Payer: Medicare Other | Admitting: Physical Therapy

## 2020-08-04 ENCOUNTER — Other Ambulatory Visit: Payer: Self-pay

## 2020-08-04 ENCOUNTER — Ambulatory Visit (INDEPENDENT_AMBULATORY_CARE_PROVIDER_SITE_OTHER): Payer: Medicare Other | Admitting: Neurology

## 2020-08-04 VITALS — BP 156/80 | HR 78 | Ht 65.0 in | Wt 158.0 lb

## 2020-08-04 DIAGNOSIS — R2681 Unsteadiness on feet: Secondary | ICD-10-CM

## 2020-08-04 DIAGNOSIS — R293 Abnormal posture: Secondary | ICD-10-CM | POA: Diagnosis not present

## 2020-08-04 DIAGNOSIS — R42 Dizziness and giddiness: Secondary | ICD-10-CM

## 2020-08-04 DIAGNOSIS — Z8679 Personal history of other diseases of the circulatory system: Secondary | ICD-10-CM | POA: Diagnosis not present

## 2020-08-04 DIAGNOSIS — M542 Cervicalgia: Secondary | ICD-10-CM | POA: Diagnosis not present

## 2020-08-04 NOTE — Patient Instructions (Signed)
1.  Will check MRI of brain 2.  Further recommendations pending results.
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
## 2020-08-04 NOTE — Telephone Encounter (Signed)
Patient called wanting to confirm she is using the estradiol vaginal cream correctly. I left message for patient to call.

## 2020-08-14 ENCOUNTER — Ambulatory Visit: Payer: Medicare Other | Admitting: Family Medicine

## 2020-08-17 ENCOUNTER — Ambulatory Visit (HOSPITAL_BASED_OUTPATIENT_CLINIC_OR_DEPARTMENT_OTHER): Payer: Medicare Other | Attending: Family Medicine | Admitting: Physical Therapy

## 2020-08-17 ENCOUNTER — Other Ambulatory Visit: Payer: Self-pay

## 2020-08-17 DIAGNOSIS — R293 Abnormal posture: Secondary | ICD-10-CM | POA: Insufficient documentation

## 2020-08-17 DIAGNOSIS — R42 Dizziness and giddiness: Secondary | ICD-10-CM

## 2020-08-17 DIAGNOSIS — R2681 Unsteadiness on feet: Secondary | ICD-10-CM | POA: Diagnosis not present

## 2020-08-17 DIAGNOSIS — M542 Cervicalgia: Secondary | ICD-10-CM
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
## 2020-08-19 ENCOUNTER — Ambulatory Visit
Admission: RE | Admit: 2020-08-19 | Discharge: 2020-08-19 | Disposition: A | Discharge status: Home / Self Care | Payer: Medicare Other | Source: Ambulatory Visit | Attending: Neurology | Admitting: Neurology

## 2020-08-19 ENCOUNTER — Other Ambulatory Visit: Payer: Self-pay

## 2020-08-19 DIAGNOSIS — Z87828 Personal history of other (healed) physical injury and trauma: Secondary | ICD-10-CM | POA: Diagnosis not present

## 2020-08-19 DIAGNOSIS — Z8679 Personal history of other diseases of the circulatory system: Secondary | ICD-10-CM | POA: Diagnosis not present

## 2020-08-19 DIAGNOSIS — R519 Headache, unspecified: Secondary | ICD-10-CM | POA: Diagnosis not present

## 2020-08-19 DIAGNOSIS — R42 Dizziness and giddiness: Secondary | ICD-10-CM | POA: Diagnosis not present

## 2020-08-19 NOTE — Progress Notes (Signed)
Phone [phone] In person visit   Subjective:   Amanda [name] is a 70 y.o. year old very pleasant female patient who presents for/with See problem oriented charting Chief Complaint  Patient presents with  . Hyperlipidemia   This visit occurred during the SARS-CoV-2 public health emergency.  Safety protocols were in place, including screening questions prior to the visit, additional usage of staff PPE, and extensive cleaning of exam room while observing appropriate contact time as indicated for disinfecting solutions.   Past Medical History-  Patient Active Problem List   Diagnosis Date Noted  . 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 History of Guillain-Barre syndrome 04/01/2018    Priority: High  . Thyroid nodule 03/27/2018    Priority: High  . Celiac disease 11/27/2007    Priority: High  . Insomnia 07/05/2007    Priority: High  . History of thyroid cancer 10/03/2006    Priority: High  . History of subdural hematoma 03/26/2019    Priority: Medium  . Herpes simplex type 1 infection 11/01/2018    Priority: Medium  . Hyperlipidemia with target LDL less than 100 03/30/2018    Priority: Medium  . Fibromyalgia 03/29/2018    Priority: Medium  . Fuchs' corneal dystrophy, 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    Priority: Medium  . Insulin resistance 03/29/2018    Priority: Medium  . Grade I diastolic dysfunction 51/88/4166    Priority: Medium  . Dysthymia 09/26/2016    Priority: Medium  . Palpitations 01/17/2013    Priority: Medium  . GERD (gastroesophageal reflux disease)     Priority: Medium  . Restless leg syndrome, on Requip     Priority: Medium  . IBS (irritable bowel syndrome) 02/16/2009    Priority: Medium  . Osteoarthritis 07/05/2007    Priority: Medium  . Osteopenia 10/03/2006    Priority: Medium  . Mastalgia, left, followed by GYN 03/29/2018    Priority: Low  . History of hysterectomy 03/29/2018    Priority: Low  .  Presbycusis of both ears 03/10/2017    Priority: Low  . Allergic rhinitis 06/25/2009    Priority: Low  . Diaphragmatic hernia 02/16/2009    Priority: Low  . Left carotid bruit 10/03/2006    Priority: Low  . Elevated blood pressure reading 03/06/2020  . Pharyngoesophageal dysphagia 12/23/2019  . 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 Pain of left calf 09/27/2019  . Pain in left knee 09/24/2019    Medications- reviewed and updated Current Outpatient Medications  Medication Sig Dispense Refill  . Alum Hydroxide-Mag Carbonate (GAVISCON PO) Take by mouth.    . APPLE CIDER VINEGAR PO Take by mouth 2 (two) times daily with a meal. Pt takes 2 capsules with each meal. Pt only eats 2 meals. Total of 4 capsules per day.    [name] aspirin EC 81 MG tablet Take 81 mg by mouth daily.    . bisacodyl (DULCOLAX) 5 MG EC tablet Take 5 mg by mouth daily as needed for moderate constipation.    . Black Currant Seed Oil 500 MG CAPS Take by mouth daily.     [name] CALCIUM PO Take by mouth. Takes 2    . cholecalciferol (VITAMIN D3) 25 MCG (1000 UT) tablet Take 1,000 Units by mouth daily.    . clonazePAM (KLONOPIN) 0.5 MG tablet Take 1 tablet (0.5 mg total) by mouth at bedtime as needed for anxiety. (Patient taking differently: Take 0.5 mg by mouth daily.) 90  tablet 1  . CO ENZYME Q-10 PO Take by mouth daily.     . Cyanocobalamin (VITAMIN B-12 ER PO) Take 5,000 mcg by mouth daily.    . diclofenac sodium (VOLTAREN) 1 % GEL APPLY TOPICALLY TO AFFECTED AREA 4 TIMES A DAY 100 g 1  . doxycycline (VIBRAMYCIN) 100 MG capsule Take 100 mg by mouth 2 (two) times daily.    [name] escitalopram (LEXAPRO) 10 MG tablet Take 10 mg by mouth daily.    [name] estradiol (ESTRACE) 0.1 MG/GM vaginal cream Apply topically to the urethra daily x 14 days then twice weekly as needed 42.5 g 0  . Melatonin 3 MG CAPS Take by mouth at bedtime as needed.     . metoprolol succinate (TOPROL-XL) 25 MG 24 hr tablet TAKE 1 TABLET BY MOUTH EVERY DAY 90 tablet  3  . Multiple Vitamin (MULTIVITAMIN) tablet Take 1 tablet by mouth daily.    . NON FORMULARY Bausch and lomb ointment-Muro-//128, 5%ointment- Each eye nightly    . rOPINIRole (REQUIP) 0.25 MG tablet TAKE 1 TABLET BY MOUTH AT BEDTIME 90 tablet 3  . rosuvastatin (CRESTOR) 5 MG tablet Take 1 tablet (5 mg total) by mouth daily. 90 tablet 3  . valACYclovir (VALTREX) 500 MG tablet One tab bid as needed for outbreak 180 tablet 1  . Calcium Carbonate (CALCIUM 500 PO) Take by mouth daily. (Patient not taking: Reported on 08/24/2020)     No current facility-administered medications for this visit.     Objective:  BP 132/84   Pulse 72   Temp 97.6 F (36.4 C) (Temporal)   Ht 5' 5"  (1.651 m)   Wt 156 lb (70.8 kg)   LMP  (LMP Unknown)   SpO2 97%   BMI 25.96 kg/m  Gen: NAD, resting comfortably No carotid bruits but we discussed this is not a very sensitive test CV: RRR no murmurs rubs or gallops Lungs: CTAB no crackles, wheeze, rhonchi-recently diagnosed with bronchitis but lungs are clear today Abdomen: soft/nontender/nondistended/normal bowel sounds. No rebound or guarding.  Ext: no edema Skin: warm, dry     Assessment and Plan   #Vulvar excision- patient was following up with Dr. Winnifred Friar further excision recommended.  She is to do monthly inspections at home with 1 year follow-up  #Vertigo- Dr. Tomi Likens thought this was cervicogenic in origin.  MRI stable 20 16-20 22.  Physical therapy was recommended and was very helpful- she has graduated and is doing home exercises.   #hyperlipidemia- peak LDL 187 S: Medication:Rosuvastatin 58m daily. No side effects.  -Prior mental fog on atorvastatin and change due to less potential to cross blood-brain barrier Lab Results  Component Value Date   CHOL 218 (H) 02/14/2020   HDL 76 02/14/2020   LDLCALC 121 (H) 02/14/2020   LDLDIRECT 139.2 03/10/2008   TRIG 103 02/14/2020   CHOLHDL 2.9 02/14/2020   A/P: Lipids slightly high patient has  preferred to remain on this dose instead of increasing medicine- LDL actually down over 30% from peak of 187- will use 130 as her goal with no history of heart attack or stroke - we will check next visit -did have a lifeline screen with possible moderate plaque on left carotid- offered to update this- but she will chat with cardiology first- if this is actually high then would lower the LDL goal to 70  #Restless legs S: Medication: Requip 0.25 mg daily.  Ferritin levels only at 28 last check and prefer over 75 and recommended iron  once or twice a week A/P: Requip is working reasonably well.  I will going to check another ferritin level but insurance does not appear to cover on the restless legs-since he is not having any symptoms at this time low yield to check and we opted to hold off-it certainly reasonable to still continue once or twice a week iron   #Insomnia S: Clonazepam 1 mg before bed and then takes another half tablet if she wakes up.  Last visit discussed trying 0.5 mg tablets instead and even still starting out with a half dose- she is taking half at bedtime and if she wakes up takes 6 mg melatonin and if needed takes other other hafl  -Confusion on Ambien in the past -On Lunesta had dizziness/disorientation -On trazodone had dizziness - Escitalopram still on this- she will let me know who is prescribing  A/P: Insomnia is reasonably well controlled-thrilled she has been able to tolerate the lower dose of clonazepam-continue current medication   #Thyroid nodule- recommended referral to interventional radiology or endocrinology-she chose endocrinology- had biopsy 04/29/20 which was low risk- plan for 1 year follow up with Dr. Loanne Drilling   # Hyperglycemia/insulin resistance/prediabetes S:  Medication: None Exercise and diet- 2 miles walking each day, reasonably healthy diet Lab Results  Component Value Date   HGBA1C 5.7 (H) 02/14/2020   HGBA1C 5.7 06/04/2019   HGBA1C 5.8 09/19/2018    A/P: hopefully stable - update a1c with labs   #Palpitations-remains on metoprolol through cardiology - slightly worse lately and plans to schedule follow up with Dr. Ellyn Hack  #Osteopenia-follows with Dr. Delilah Shan but [place] is [place].  She is on calcium and vitamin D.  She has opted to remain off of medication such as Prolia or Reclast- she wants to hold off for now   #Esophageal obstruction/stricture- patient has done well since the most recent stretching with Dr. Oren Beckmann of [place].  She is on B12 while on her pantoprazole 40 mg  #Health maintenance-patient plans to hold off on fourth COVID vaccination for now-has been without reaction to his immunization and apparently developed Sjogren's  Recommended follow up: Return in about 6 months (around 02/23/2021) for follow up- or sooner if needed. Future Appointments  Date Time Provider [place]  12/07/2020  2:30 PM Joseph Pierini, MD [facility] None   Lab/Order associations:   ICD-10-CM   1. Hyperlipidemia with target LDL less than 100  E78.5 Lipid panel    Comprehensive metabolic panel  2. Restless leg syndrome, on Requip  G25.81   3. Hyperglycemia  R73.9 Hemoglobin A1c  4. Insomnia, unspecified type  G47.00   5. Thyroid nodule  E04.1    Return precautions advised.  Garret Reddish, MD

## 2020-08-19 NOTE — Patient Instructions (Addendum)
Please stop by lab before you go If you have mychart- we will send your results within 3 business days of [place] receiving them.  If you do not have mychart- we will call you about results within 5 business days of [place] receiving them.  *please also note that you will see labs on mychart as soon as they post. I will later go in and write notes on them- will say "notes from Dr. Yong Channel"  No changes today unless labs lead [place] to make changes  Chat with Dr. Ellyn Hack but I think reasonable to do a formal carotid test- let me knwo if you want me to order or he can also order  Could also get his opinion on cholesterol goals- plaque on carotids would certainly cause [place] to want to be more aggressive. For now we will tolerate LDL under 130 which is down at least 30% from pre medicine

## 2020-08-20 ENCOUNTER — Telehealth: Payer: Medicare Other | Admitting: Family Medicine

## 2020-08-20 DIAGNOSIS — Z1152 Encounter for screening for COVID-19: Secondary | ICD-10-CM | POA: Diagnosis not present

## 2020-08-20 DIAGNOSIS — J209 Acute bronchitis, unspecified: Secondary | ICD-10-CM | POA: Diagnosis not present

## 2020-08-20 DIAGNOSIS — Z20822 Contact with and (suspected) exposure to covid-19: Secondary | ICD-10-CM | POA: Diagnosis not present

## 2020-08-20 DIAGNOSIS — J069 Acute upper respiratory infection, unspecified: Secondary | ICD-10-CM | POA: Diagnosis not present

## 2020-08-24 ENCOUNTER — Encounter: Payer: Self-pay | Admitting: Family Medicine

## 2020-08-24 ENCOUNTER — Other Ambulatory Visit: Payer: Self-pay

## 2020-08-24 ENCOUNTER — Encounter (HOSPITAL_BASED_OUTPATIENT_CLINIC_OR_DEPARTMENT_OTHER): Payer: Medicare Other | Admitting: Physical Therapy

## 2020-08-24 ENCOUNTER — Telehealth: Payer: Self-pay

## 2020-08-24 ENCOUNTER — Ambulatory Visit (INDEPENDENT_AMBULATORY_CARE_PROVIDER_SITE_OTHER): Payer: Medicare Other | Admitting: Family Medicine

## 2020-08-24 VITALS — BP 132/84 | HR 72 | Temp 97.6°F | Ht 65.0 in | Wt 156.0 lb

## 2020-08-24 DIAGNOSIS — E785 Hyperlipidemia, unspecified: Secondary | ICD-10-CM | POA: Diagnosis not present

## 2020-08-24 DIAGNOSIS — R739 Hyperglycemia, unspecified: Secondary | ICD-10-CM

## 2020-08-24 DIAGNOSIS — G4700 Insomnia, unspecified: Secondary | ICD-10-CM

## 2020-08-24 DIAGNOSIS — G2581 Restless legs syndrome: Secondary | ICD-10-CM | POA: Diagnosis not present

## 2020-08-24 DIAGNOSIS — E041 Nontoxic single thyroid nodule: Secondary | ICD-10-CM | POA: Diagnosis not present

## 2020-08-24 NOTE — Telephone Encounter (Signed)
Pt called back and was informed that MRI of brain looks stable compared to the one from 2016 - no new bleed or stroke. Dizziness likely related to neck. No further recommendations other than the physical therapy

## 2020-08-24 NOTE — Telephone Encounter (Signed)
-----   Message from Pieter Partridge, DO sent at 08/20/2020 11:57 AM EDT ----- MRI of brain looks stable compared to the one from 2016 - no new bleed or stroke.  Dizziness likely related to neck.  No further recommendations other than the physical therapy.

## 2020-08-25 LAB — COMPREHENSIVE METABOLIC PANEL
ALT: 24 U/L (ref 0–35)
AST: 32 U/L (ref 0–37)
Albumin: 4.6 g/dL (ref 3.5–5.2)
Alkaline Phosphatase: 75 U/L (ref 39–117)
BUN: 19 mg/dL (ref 6–23)
CO2: 28 mEq/L (ref 19–32)
Calcium: 9.9 mg/dL (ref 8.4–10.5)
Chloride: 101 mEq/L (ref 96–112)
Creatinine, Ser: 0.8 mg/dL (ref 0.40–1.20)
GFR: 74.68 mL/min (ref >60.00)
Glucose, Bld: 92 mg/dL (ref 70–99)
Potassium: 4.5 mEq/L (ref 3.5–5.1)
Sodium: 137 mEq/L (ref 135–145)
Total Bilirubin: 0.5 mg/dL (ref 0.2–1.2)
Total Protein: 7.3 g/dL (ref 6.0–8.3)

## 2020-08-25 LAB — LIPID PANEL
Cholesterol: 162 mg/dL (ref 0–200)
HDL: 64.7 mg/dL (ref >39.00)
LDL Cholesterol: 80 mg/dL (ref 0–99)
NonHDL: 97.68
Total CHOL/HDL Ratio: 3
Triglycerides: 89 mg/dL (ref 0.0–149.0)
VLDL: 17.8 mg/dL (ref 0.0–40.0)

## 2020-08-25 LAB — HEMOGLOBIN A1C: Hgb A1c MFr Bld: 5.8 % (ref 4.6–6.5)

## 2020-08-26 DIAGNOSIS — H04123 Dry eye syndrome of bilateral lacrimal glands: Secondary | ICD-10-CM | POA: Diagnosis not present

## 2020-08-26 DIAGNOSIS — H18513 Endothelial corneal dystrophy, bilateral: Secondary | ICD-10-CM | POA: Diagnosis not present

## 2020-08-26 DIAGNOSIS — Z961 Presence of intraocular lens: Secondary | ICD-10-CM | POA: Diagnosis not present

## 2020-08-26 DIAGNOSIS — H26493 Other secondary cataract, bilateral: Secondary | ICD-10-CM | POA: Diagnosis not present

## 2020-08-26 DIAGNOSIS — H18523 Epithelial (juvenile) corneal dystrophy, bilateral: Secondary | ICD-10-CM | POA: Diagnosis not present

## 2020-09-01 ENCOUNTER — Telehealth: Payer: Self-pay | Admitting: Physician Assistant

## 2020-09-01 NOTE — Telephone Encounter (Signed)
Spoke to patient she stated she has been having palpitations for the past 3 months off and on.Stated palpitations seem to be getting more frequent.No chest pain.Appointment scheduled with Almyra Deforest PA 09/11/20 at 9:15 am.

## 2020-09-01 NOTE — Telephone Encounter (Signed)
Patient c/o Palpitations:  High priority if patient c/o lightheadedness, shortness of breath, or chest pain  1) How long have you had palpitations/irregular HR/ Afib? Are you having the symptoms now? Off and on for 3 months, no symptoms now   2) Are you currently experiencing lightheadedness, SOB or CP? No   3) Do you have a history of afib (atrial fibrillation) or irregular heart rhythm? Yes   4) Have you checked your BP or HR? (document readings if available): No   5) Are you experiencing any other symptoms? No  Feels like heart is fluttering as if someone thumps her in her chest. Reports it is not chest pain and has been occurring for a few months. Appt has been made for 09/22/20. States it is only occurring when sitting or laying down, does not happen while exercising.

## 2020-09-11 ENCOUNTER — Other Ambulatory Visit: Payer: Self-pay

## 2020-09-11 ENCOUNTER — Ambulatory Visit (INDEPENDENT_AMBULATORY_CARE_PROVIDER_SITE_OTHER): Payer: Medicare Other | Admitting: Physician Assistant

## 2020-09-11 ENCOUNTER — Encounter: Payer: Self-pay | Admitting: Physician Assistant

## 2020-09-11 VITALS — BP 122/77 | HR 73 | Ht 65.0 in | Wt 159.0 lb

## 2020-09-11 DIAGNOSIS — R002 Palpitations: Secondary | ICD-10-CM

## 2020-09-11 DIAGNOSIS — E785 Hyperlipidemia, unspecified: Secondary | ICD-10-CM

## 2020-09-11 LAB — TSH+FREE T4
Free T4: 1.13 ng/dL (ref 0.82–1.77)
TSH: 1.57 u[IU]/mL (ref 0.450–4.500)

## 2020-09-11 MED ORDER — METOPROLOL SUCCINATE ER 50 MG PO TB24: 50.0000 mg | ORAL_TABLET | Freq: Every day | ORAL | 1 refills | Status: DC

## 2020-09-11 NOTE — Progress Notes (Signed)
Normal thyroid level

## 2020-09-11 NOTE — Patient Instructions (Signed)
Medication Instructions:  Take metoprolol succinate 53m once daily.   *If you need a refill on your cardiac medications before your next appointment, please call your pharmacy*   Lab Work: TSH and free T4 to be drawn today.   If you have labs (blood work) drawn today and your tests are completely normal, you will receive your results only by: .Marland KitchenMyChart Message (if you have MyChart) OR . A paper copy in the mail If you have any lab test that is abnormal or we need to change your treatment, we will call you to review the results.   Testing/Procedures: None ordered   Follow-Up: [facility] and your [facility] needs are our priority.  As part of our continuing mission to provide you with exceptional heart care, we have created designated Provider Care Teams.  These Care Teams include your primary Cardiologist (physician) and Advanced Practice Providers (APPs -  Physician Assistants and Nurse Practitioners) who all work together to provide you with the care you need, when you need it.  We recommend signing up for the patient portal called "MyChart".  Sign up information is provided on this After Visit Summary.  MyChart is used to connect with patients for Virtual Visits (Telemedicine).  Patients are able to view lab/test results, encounter notes, upcoming appointments, etc.  Non-urgent messages can be sent to your provider as well.   To learn more about what you can do with MyChart, go to [email or web address]    Your next appointment:   4-6 week(s)  The format for your next appointment:   In Person  Provider:   DGlenetta Hew MD or HAlmyra Deforest PUtah

## 2020-09-11 NOTE — Progress Notes (Signed)
Cardiology Office Note:    Date:  09/12/2020   ID:  Amanda [name], DOB 14-Aug-1950, MRN [id number]  PCP:  Marin Olp, MD   [facility] Providers Cardiologist:  Glenetta Hew, MD {  Referring MD: Marin Olp, MD   No chief complaint on file.   History of Present Illness:    Amanda [name] is a 70 y.o. female with a hx of celiac disease, GERD, palpitation, hyperlipidemia, restless leg syndrome, and a history of atypical chest pain.  She is a former patient of Dr. Rollene Fare. She has been unable to tolerate statins because of memory issues and this was changed to Zetia.  Previous treadmill nuclear stress test obtained in November 2012 showed EF 65%, patient was able to achieve 11 METS of activity, there was no significant ischemia demonstrated, overall low risk study.  Echocardiogram obtained on 01/18/2013 showed EF 60 to 65%, grade 1 DD.  She was last seen by Dr. Ellyn Hack in October 2021, given her risk factors, coronary calcium scoring was recommended, however she was not ready to proceed at the time.  She was able to tolerate a very low-dose of Crestor 5 mg.  Based on the most recent visit with her PCP, it appears her palpitation has worsened lately.  Patient presents today for evaluation of palpitation.  She previously wear a heart monitor in 2013 for evaluation of palpitation.  In the past 3 months, her palpitation has increased.  She says although her previous palpitation in the past has always been a skipped heartbeat, she noticed recently that she has been having short bursts of tachypalpitations as well, the longest duration was 3 seconds.  Symptom does not occur every day and never occurs during physical exertion either.  She is able to walk 2 miles each day.  Her TSH has been slowly decreasing over the past few months based on the previous blood work, I would recommend a TSH and free T4 again.  She does not drink any caffeinated drinks.  I recommended increase the  metoprolol succinate to 50 mg daily.  I plan to bring her back for reassessment in 4 to 6 weeks, if her palpitation is still fairly frequent, I would consider a heart monitor.  If we can reduce her palpitation to less than one occurrence each month, then she will not need further work-up.  Past Medical History:  Diagnosis Date  . Anxiety   . Celiac disease   . Chronic constipation   . COVID-19 virus vaccine not available   . GERD (gastroesophageal reflux disease)    occasionally (per pt takes apple cider vinager)  . Heart palpitations    cardiologist-- dr Ellyn Hack--- event monitor 08-24-2011 epic ;  nuclear stress test 03-16-2011 (epic) normal w/ no ischemia, ef 65%;  echo 01-18-2013  ef 60-65%, G1DD  . Hiatal hernia   . History of subdural hematoma    12/ 23/ 2010  s/p  craniotomy w/ hematoma evacuation (pt had a fall w/ concussion)  per pt no residual  . History of thyroid cancer followed by dr Constance Holster---   s/p  left thyroidectomy --- per pt no radiation and no recurrence  . Hyperlipidemia   . Immunization, single disease 06/09/2019   1st dose moderma vaccine administered  . Insomnia   . OA (osteoarthritis)   . Osteopenia, T score -2.1 FRAX 9.4%/1.3% stable from prior DEXA 06/2015  . Restless leg syndrome   . Right thyroid nodule    followed by dr Constance Holster---  last ultrasound in epic 03-30-2018 stable , to bx  . STD (sexually transmitted disease), HSV   . 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 Wears glasses     Past Surgical History:  Procedure Laterality Date  . ABDOMINAL HYSTERECTOMY  1986   ovaries remain  . BREAST REDUCTION SURGERY Bilateral 1999  . CATARACT EXTRACTION W/ INTRAOCULAR LENS  IMPLANT, BILATERAL  2019  . COLONOSCOPY  last one 11-22-2018  . ESOPHAGOGASTRODUODENOSCOPY    . KNEE ARTHROSCOPY Bilateral 2002  . SUBDURAL HEMATOMA EVACUATION VIA CRANIOTOMY  04-30-2009  @[facility]    left frontotemporapartietal   . THYROID LOBECTOMY Left 05-04-2001  @[facility]   dr  Constance Holster  . VULVECTOMY N/A 03/12/2019   Procedure: WIDE LOCAL EXCISION OF CARCINOMA IS SITU OF VULVA;  Surgeon: Anastasio Auerbach, MD;  Location: [place];  Service: Gynecology;  Laterality: N/A;  request to follow in [employer] block time requests one hour colposcope available in OR with acetic acid.  [name] N/A 06/17/2019   Procedure: WIDE LOCAL  EXCISION VULVECTOMY;  Surgeon: Everitt Amber, MD;  Location: [facility];  Service: Gynecology;  Laterality: N/A;    Current Medications: Current Meds  Medication Sig  . Alum Hydroxide-Mag Carbonate (GAVISCON PO) Take by mouth.  . APPLE CIDER VINEGAR PO Take by mouth 2 (two) times daily with a meal. Pt takes 2 capsules with each meal. Pt only eats 2 meals. Total of 4 capsules per day.  [name] aspirin EC 81 MG tablet Take 81 mg by mouth daily.  . bisacodyl (DULCOLAX) 5 MG EC tablet Take 5 mg by mouth daily as needed for moderate constipation.  [name] CALCIUM PO Take by mouth. Takes 2  . cholecalciferol (VITAMIN D3) 25 MCG (1000 UT) tablet Take 1,000 Units by mouth daily.  . clonazePAM (KLONOPIN) 0.5 MG tablet Take 1 tablet (0.5 mg total) by mouth at bedtime as needed for anxiety. (Patient taking differently: Take 0.5 mg by mouth daily.)  . CO ENZYME Q-10 PO Take by mouth daily.   . Cyanocobalamin (VITAMIN B-12 ER PO) Take 5,000 mcg by mouth daily.  . diclofenac sodium (VOLTAREN) 1 % GEL APPLY TOPICALLY TO AFFECTED AREA 4 TIMES A DAY  . escitalopram (LEXAPRO) 10 MG tablet Take 10 mg by mouth daily.  . Melatonin 3 MG CAPS Take by mouth at bedtime as needed.   . metoprolol succinate (TOPROL-XL) 50 MG 24 hr tablet Take 1 tablet (50 mg total) by mouth daily. Take with or immediately following a meal.  . Multiple Vitamin (MULTIVITAMIN) tablet Take 1 tablet by mouth daily.  . NON FORMULARY Bausch and lomb ointment-Muro-//128, 5%ointment- Each eye nightly  . rOPINIRole (REQUIP) 0.25 MG tablet TAKE 1 TABLET BY MOUTH AT BEDTIME   . rosuvastatin (CRESTOR) 5 MG tablet Take 1 tablet (5 mg total) by mouth daily.  . valACYclovir (VALTREX) 500 MG tablet One tab bid as needed for outbreak  . [DISCONTINUED] Black Currant Seed Oil 500 MG CAPS Take by mouth daily.   . [DISCONTINUED] Calcium Carbonate (CALCIUM 500 PO) Take by mouth daily.  . [DISCONTINUED] doxycycline (VIBRAMYCIN) 100 MG capsule Take 100 mg by mouth 2 (two) times daily.  . [DISCONTINUED] estradiol (ESTRACE) 0.1 MG/GM vaginal cream Apply topically to the urethra daily x 14 days then twice weekly as needed  . [DISCONTINUED] metoprolol succinate (TOPROL-XL) 25 MG 24 hr tablet TAKE 1 TABLET BY MOUTH EVERY DAY     Allergies:   Ambien [zolpidem tartrate], Cortisone,  Eszopiclone and related, Levofloxacin, Dicloxacillin, Dilantin [phenytoin sodium extended], Erythromycin, Avelox [moxifloxacin hcl in nacl], Influenza vaccines, Other, Nitrofuran derivatives, Phenytoin sodium extended, and Trazodone and nefazodone   Social History   Socioeconomic History  . Marital status: Married    Spouse name: Not on file  . Number of children: 2  . Years of education: Not on file  . Highest education level: Not on file  Occupational History  . Not on file  Tobacco Use  . Smoking status: Never Smoker  . Smokeless tobacco: Never Used  Vaping Use  . Vaping Use: Never used  Substance and Sexual Activity  . Alcohol use: Yes    Comment: 2-3 times weekly  . Drug use: Never  . Sexual activity: Not Currently    Partners: Male    Birth control/protection: Surgical    Comment: HYST-1st intercourse 70 yo-Fewer than 5 partners  Other Topics Concern  . Not on file  Social History Narrative   Married mother of 68 with 74 year old granddaughter (born in 2019) and another due in 2022. Her son Thurmond Butts is patient of Dr. Yong Channel       Lives with husband in a three story home, no issue with stairs.    Highest level of education is an associates.       She exercises regularly, leads an active  lifestyle, has a healthy diet, and is very socially engaged. Self-described "HSP: highly sensitive person" and therefore tends to worry more about symptoms than others might.      Hobbies: enjoys time with friends, time on front porch with friends during covid, park daily, family time   Social Determinants of Health   Financial Resource Strain: Not on file  Food Insecurity: Not on file  Transportation Needs: Not on file  Physical Activity: Not on file  Stress: Not on file  Social Connections: Not on file     Family History: The patient's family history includes Breast cancer (age of onset: 66) in her maternal aunt; Diabetes in her maternal aunt; Failure to thrive in her mother; Heart disease in her maternal grandfather; Hyperlipidemia in her maternal grandfather; Hypertension in her paternal grandfather; Liver cancer in her father. There is no history of Colon cancer or Thyroid disease.  ROS:   Please see the history of present illness.     All other systems reviewed and are negative.  EKGs/Labs/Other Studies Reviewed:    The following studies were reviewed today:  Echo 01/18/2013 LV EF: 60% -  65%   ------------------------------------------------------------  Indications:   CVA 436.   ------------------------------------------------------------  History:  PMH: Right arm weakness. Hyperglycemia. History  of thyroid cancer. Restless leg syndrome. Headache. Chronic  palpitations. Risk factors: Dyslipidemia.   ------------------------------------------------------------  Study Conclusions   - Left ventricle: The cavity size was normal. Wall thickness  was normal. Systolic function was normal. The estimated  ejection fraction was in the range of 60% to 65%. Wall  motion was normal; there were no regional wall motion  abnormalities. Doppler parameters are consistent with  abnormal left ventricular relaxation (grade 1 diastolic  dysfunction). The E/e' ratio  is <10, suggesting normal LV  filling pressure.  - Left atrium: The atrium was normal in size.  - Inferior vena cava: The vessel was normal in size; the  respirophasic diameter changes were in the normal range (=  50%); findings are consistent with normal central venous  pressure.    EKG:  EKG is ordered today.  The ekg ordered  today demonstrates normal sinus rhythm, no significant ST-T wave changes.  Recent Labs: 02/14/2020: Hemoglobin 12.6; Platelets 247 08/24/2020: ALT 24; BUN 19; Creatinine, Ser 0.80; Potassium 4.5; Sodium 137 09/11/2020: TSH 1.570  Recent Lipid Panel    Component Value Date/Time   CHOL 162 08/24/2020 1426   CHOL 315 (H) 05/15/2015 1136   TRIG 89.0 08/24/2020 1426   TRIG 92 05/15/2015 1136   TRIG 70 04/12/2006 1129   HDL 64.70 08/24/2020 1426   HDL 84 05/15/2015 1136   CHOLHDL 3 08/24/2020 1426   VLDL 17.8 08/24/2020 1426   LDLCALC 80 08/24/2020 1426   LDLCALC 121 (H) 02/14/2020 1603   LDLCALC 213 (H) 05/15/2015 1136   LDLDIRECT 139.2 03/10/2008 1036     Risk Assessment/Calculations:       Physical Exam:    VS:  BP 122/77   Pulse 73   Ht 5' 5"  (1.651 m)   Wt 159 lb (72.1 kg)   LMP  (LMP Unknown)   SpO2 97%   BMI 26.46 kg/m     Wt Readings from Last 3 Encounters:  09/11/20 159 lb (72.1 kg)  08/24/20 156 lb (70.8 kg)  08/04/20 158 lb (71.7 kg)     GEN:  Well nourished, well developed in no acute distress HEENT: Normal NECK: No JVD; No carotid bruits LYMPHATICS: No lymphadenopathy CARDIAC: RRR, no murmurs, rubs, gallops RESPIRATORY:  Clear to auscultation without rales, wheezing or rhonchi  ABDOMEN: Soft, non-tender, non-distended MUSCULOSKELETAL:  No edema; No deformity  SKIN: Warm and dry NEUROLOGIC:  Alert and oriented x 3 PSYCHIATRIC:  Normal affect   ASSESSMENT:    1. Palpitations   2. Hyperlipidemia with target LDL less than 100    PLAN:    In order of problems listed above:  1. Palpitation: I will increase her  metoprolol succinate to 50 mg daily.  I plan to bring the patient back in a few weeks for reassessment, if palpitations still persist, will consider heart monitor.  Obtain TSH and free T4  2. Hyperlipidemia: On Crestor        Medication Adjustments/Labs and Tests Ordered: Current medicines are reviewed at length with the patient today.  Concerns regarding medicines are outlined above.  Orders Placed This Encounter  Procedures  . TSH + free T4  . EKG 12-Lead   Meds ordered this encounter  Medications  . metoprolol succinate (TOPROL-XL) 50 MG 24 hr tablet    Sig: Take 1 tablet (50 mg total) by mouth daily. Take with or immediately following a meal.    Dispense:  90 tablet    Refill:  1    Patient Instructions  Medication Instructions:  Take metoprolol succinate 7m once daily.   *If you need a refill on your cardiac medications before your next appointment, please call your pharmacy*   Lab Work: TSH and free T4 to be drawn today.   If you have labs (blood work) drawn today and your tests are completely normal, you will receive your results only by: .Marland KitchenMyChart Message (if you have MyChart) OR . A paper copy in the mail If you have any lab test that is abnormal or we need to change your treatment, we will call you to review the results.   Testing/Procedures: None ordered   Follow-Up: [facility] and your health needs are our priority.  As part of our continuing mission to provide you with exceptional heart care, we have created designated Provider Care Teams.  These Care  Teams include your primary Cardiologist (physician) and Advanced Practice Providers (APPs -  Physician Assistants and Nurse Practitioners) who all work together to provide you with the care you need, when you need it.  We recommend signing up for the patient portal called "MyChart".  Sign up information is provided on this After Visit Summary.  MyChart is used to connect with patients for Virtual  Visits (Telemedicine).  Patients are able to view lab/test results, encounter notes, upcoming appointments, etc.  Non-urgent messages can be sent to your provider as well.   To learn more about what you can do with MyChart, go to [email or web address].    Your next appointment:   4-6 week(s)  The format for your next appointment:   In Person  Provider:   Glenetta Hew, MD or Almyra Deforest, PA       Signed, [place], [place]  09/12/2020 11:40 PM    Index

## 2020-09-12 ENCOUNTER — Encounter: Payer: Self-pay | Admitting: Physician Assistant

## 2020-09-22 ENCOUNTER — Ambulatory Visit: Payer: Medicare Other | Admitting: Physician Assistant

## 2020-09-30 DIAGNOSIS — M1712 Unilateral primary osteoarthritis, left knee: Secondary | ICD-10-CM | POA: Diagnosis not present

## 2020-10-18 ENCOUNTER — Other Ambulatory Visit: Payer: Self-pay | Admitting: Family Medicine

## 2020-10-18 DIAGNOSIS — R059 Cough, unspecified: Secondary | ICD-10-CM | POA: Diagnosis not present

## 2020-10-18 DIAGNOSIS — Z20828 Contact with and (suspected) exposure to other viral communicable diseases: Secondary | ICD-10-CM | POA: Diagnosis not present

## 2020-10-18 DIAGNOSIS — J069 Acute upper respiratory infection, unspecified: Secondary | ICD-10-CM | POA: Diagnosis not present

## 2020-10-20 ENCOUNTER — Telehealth (INDEPENDENT_AMBULATORY_CARE_PROVIDER_SITE_OTHER): Payer: Medicare Other | Admitting: Family Medicine

## 2020-10-20 ENCOUNTER — Encounter: Payer: Self-pay | Admitting: Family Medicine

## 2020-10-20 ENCOUNTER — Ambulatory Visit: Payer: Medicare Other | Admitting: Physician Assistant

## 2020-10-20 VITALS — Temp 97.7°F

## 2020-10-20 DIAGNOSIS — R059 Cough, unspecified: Secondary | ICD-10-CM | POA: Diagnosis not present

## 2020-10-20 MED ORDER — BENZONATATE 100 MG PO CAPS: 100.0000 mg | ORAL_CAPSULE | Freq: Three times a day (TID) | ORAL | 0 refills | Status: DC | PRN

## 2020-10-20 NOTE — Patient Instructions (Signed)
-  I sent the medication(s) we discussed to your pharmacy: Meds ordered this encounter  Medications   benzonatate (TESSALON PERLES) 100 MG capsule    Sig: Take 1 capsule (100 mg total) by mouth 3 (three) times daily as needed.    Dispense:  20 capsule    Refill:  0     I hope you are feeling better soon!  Seek in person care promptly if your symptoms worsen, new concerns arise or you are not improving with treatment.  It was nice to meet you today. I help [facility] out with telemedicine visits on Tuesdays and Thursdays and am available for visits on those days. If you have any concerns or questions following this visit please schedule a follow up visit with your Primary Care doctor or seek care at a local urgent care clinic to avoid delays in care.

## 2020-10-20 NOTE — Progress Notes (Signed)
Virtual Visit via Video Note  I connected with Amanda [name]  on 10/20/20 at  1:20 PM EDT by a video enabled telemedicine application and verified that I am speaking with the correct person using two identifiers.  Location patient: home, [place] Location provider:work or home office Persons participating in the virtual visit: patient, provider  I discussed the limitations of evaluation and management by telemedicine and the availability of in person appointments. The patient expressed understanding and agreed to proceed.   HPI:  Acute telemedicine visit for a cough: -Onset: 3 days ago -exposed to grandson and daughter with cold symptoms and a bad cough - he tested negative for covid -she went to urgent care 2 days ago and the test was negative for covid- she was treated with codeine cough syrup and azithromycin (was told her lungs are clear) -Symptoms include: cough, HA initially, sore throat, nasal congestion, lots of discolored sputum, wheezing, fever initially - now resolved, had body aches, loss of taste -Denies: CP, SOB, hemoptysis, NVD, inability to eat/drink/get out of bed -Has tried:advil, essential oils -Pertinent past medical history: see below -Pertinent medication allergies:  Allergies  Allergen Reactions   Ambien [Zolpidem Tartrate] Other (See Comments)    Causes Neurological problems with very bad dizziness, pains, disorientation   Cortisone Other (See Comments)    REACTION: "mania" post ESI   Eszopiclone And Related Other (See Comments)    Causes Neurological problems with very bad dizziness, pains, disorientation   Levofloxacin Other (See Comments)    Muscle spasms and jerking movements   Dicloxacillin Other (See Comments)    Stomach problems, swelling of tongue   Dilantin [Phenytoin Sodium Extended] Hives and Itching   Erythromycin Other (See Comments)    Tears stomach up   Avelox [Moxifloxacin Hcl In Nacl]     Pt unsure of reaction.   Influenza Vaccines     Guillain  Barre in the past- 20 years ago   Other     Celiac disease   Nitrofuran Derivatives Other (See Comments)    unknown   Phenytoin Sodium Extended Rash   Trazodone And Nefazodone Other (See Comments)    dizzy  -COVID-19 vaccine status: full vaccinated and had booster  ROS: See pertinent positives and negatives per HPI.  Past Medical History:  Diagnosis Date   Anxiety    Celiac disease    Chronic constipation    COVID-19 virus vaccine not available    GERD (gastroesophageal reflux disease)    occasionally (per pt takes apple cider vinager)   Heart palpitations    cardiologist-- dr Ellyn Hack--- event monitor 08-24-2011 epic ;  nuclear stress test 03-16-2011 (epic) normal w/ no ischemia, ef 65%;  echo 01-18-2013  ef 60-65%, G1DD   Hiatal hernia    History of subdural hematoma    12/ 23/ 2010  s/p  craniotomy w/ hematoma evacuation (pt had a fall w/ concussion)  per pt no residual   History of thyroid cancer followed by dr Constance Holster---   s/p  left thyroidectomy --- per pt no radiation and no recurrence   Hyperlipidemia    Immunization, single disease 06/09/2019   1st dose moderma vaccine administered   Insomnia    OA (osteoarthritis)    Osteopenia, T score -2.1 FRAX 9.4%/1.3% stable from prior DEXA 06/2015   Restless leg syndrome    Right thyroid nodule    followed by dr Constance Holster--- last ultrasound in epic 03-30-2018 stable , to bx   STD (sexually transmitted disease), HSV  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   Wears glasses     Past Surgical History:  Procedure Laterality Date   ABDOMINAL HYSTERECTOMY  1986   ovaries remain   BREAST REDUCTION SURGERY Bilateral 1999   CATARACT EXTRACTION W/ INTRAOCULAR LENS  IMPLANT, BILATERAL  2019   COLONOSCOPY  last one 11-22-2018   ESOPHAGOGASTRODUODENOSCOPY     KNEE ARTHROSCOPY Bilateral 2002   SUBDURAL HEMATOMA EVACUATION VIA CRANIOTOMY  04-30-2009  @[facility]    left frontotemporapartietal    THYROID LOBECTOMY Left 05-04-2001   @[facility]   dr Constance Holster   VULVECTOMY N/A 03/12/2019   Procedure: WIDE LOCAL EXCISION OF CARCINOMA IS SITU OF VULVA;  Surgeon: Anastasio Auerbach, MD;  Location: [place];  Service: Gynecology;  Laterality: N/A;  request to follow in [place] block time requests one hour colposcope available in OR with acetic acid.   VULVECTOMY N/A 06/17/2019   Procedure: WIDE LOCAL  EXCISION VULVECTOMY;  Surgeon: Everitt Amber, MD;  Location: [facility];  Service: Gynecology;  Laterality: N/A;     Current Outpatient Medications:    Alum Hydroxide-Mag Carbonate (GAVISCON PO), Take by mouth., Disp: , Rfl:    aspirin EC 81 MG tablet, Take 81 mg by mouth daily., Disp: , Rfl:    azithromycin (ZITHROMAX) 250 MG tablet, Take 250 mg by mouth as directed., Disp: , Rfl:    benzonatate (TESSALON PERLES) 100 MG capsule, Take 1 capsule (100 mg total) by mouth 3 (three) times daily as needed., Disp: 20 capsule, Rfl: 0   bisacodyl (DULCOLAX) 5 MG EC tablet, Take 5 mg by mouth daily as needed for moderate constipation., Disp: , Rfl:    CALCIUM PO, Take by mouth. Takes 2, Disp: , Rfl:    cholecalciferol (VITAMIN D3) 25 MCG (1000 UT) tablet, Take 1,000 Units by mouth daily., Disp: , Rfl:    clonazePAM (KLONOPIN) 0.5 MG tablet, Take 1 tablet (0.5 mg total) by mouth at bedtime as needed for anxiety. (Patient taking differently: Take 0.5 mg by mouth daily.), Disp: 90 tablet, Rfl: 1   CO ENZYME Q-10 PO, Take by mouth daily. , Disp: , Rfl:    Cyanocobalamin (VITAMIN B-12 ER PO), Take 5,000 mcg by mouth daily., Disp: , Rfl:    diclofenac sodium (VOLTAREN) 1 % GEL, APPLY TOPICALLY TO AFFECTED AREA 4 TIMES A DAY (Patient taking differently: as needed. APPLY TOPICALLY TO AFFECTED AREA 4 TIMES A DAY), Disp: 100 g, Rfl: 1   escitalopram (LEXAPRO) 10 MG tablet, Take 10 mg by mouth daily., Disp: , Rfl:    guaiFENesin-codeine (ROBITUSSIN AC) 100-10 MG/5ML syrup, Take 5 mLs by mouth 3 (three) times daily as needed  for cough., Disp: , Rfl:    Melatonin 3 MG CAPS, Take by mouth at bedtime as needed. , Disp: , Rfl:    metoprolol succinate (TOPROL-XL) 50 MG 24 hr tablet, Take 1 tablet (50 mg total) by mouth daily. Take with or immediately following a meal., Disp: 90 tablet, Rfl: 1   Multiple Vitamin (MULTIVITAMIN) tablet, Take 1 tablet by mouth daily., Disp: , Rfl:    NON FORMULARY, Bausch and lomb ointment-Muro-//128, 5%ointment- Each eye nightly, Disp: , Rfl:    rOPINIRole (REQUIP) 0.25 MG tablet, TAKE 1 TABLET BY MOUTH AT BEDTIME, Disp: 90 tablet, Rfl: 3   rosuvastatin (CRESTOR) 5 MG tablet, TAKE 1 TABLET BY MOUTH EVERY DAY, Disp: 90 tablet, Rfl: 3   valACYclovir (VALTREX) 500 MG tablet, One tab bid as needed for outbreak,  Disp: 180 tablet, Rfl: 1  EXAM:  VITALS per patient if applicable:  GENERAL: alert, oriented, appears well and in no acute distress  HEENT: atraumatic, conjunttiva clear, no obvious abnormalities on inspection of external nose and ears  NECK: normal movements of the head and neck  LUNGS: on inspection no signs of respiratory distress, breathing rate appears normal, no obvious gross SOB, gasping or wheezing  CV: no obvious cyanosis  MS: moves all visible extremities without noticeable abnormality  PSYCH/NEURO: pleasant and cooperative, no obvious depression or anxiety, speech and thought processing grossly intact  ASSESSMENT AND PLAN:  Discussed the following assessment and plan:  Cough  -we discussed possible serious and likely etiologies, options for evaluation and workup, limitations of telemedicine visit vs in person visit, treatment, treatment risks and precautions. Pt prefers to treat via telemedicine empirically rather than in person at this moment.  Query influenza, viral upper respiratory illness with bronchitis, COVID-19 with false negative testing versus other.  Opted for continuation of Z-Pak (fevers are trending down), albuterol as needed every 6 hours as she has  not tried this yet, but has required it with other illnesses, Tessalon for cough if needed and she plans to repeat COVID test on day 4.  Discussed options for video visits if COVID test is positive and she desires to pursue treatment.  She would need a follow-up video visit with her primary care office, [place] or an online group. Advised to seek prompt in person care if worsening, new symptoms arise, or if is not improving with treatment. Discussed options for inperson care if PCP office not available. Did let this patient know that I only do telemedicine on Tuesdays and Thursdays for [place]. Advised to schedule follow up visit with PCP or [facility] if any further questions or concerns to avoid delays in care.   I discussed the assessment and treatment plan with the patient. The patient was provided an opportunity to ask questions and all were answered. The patient agreed with the plan and demonstrated an understanding of the instructions.     Lucretia Kern, DO

## 2020-10-21 DIAGNOSIS — Z20822 Contact with and (suspected) exposure to covid-19: Secondary | ICD-10-CM | POA: Diagnosis not present

## 2020-10-27 DIAGNOSIS — R059 Cough, unspecified: Secondary | ICD-10-CM | POA: Diagnosis not present

## 2020-10-27 DIAGNOSIS — S2096XA Insect bite (nonvenomous) of unspecified parts of thorax, initial encounter: Secondary | ICD-10-CM | POA: Diagnosis not present

## 2020-10-27 DIAGNOSIS — R062 Wheezing: Secondary | ICD-10-CM | POA: Diagnosis not present

## 2020-10-27 DIAGNOSIS — H6503 Acute serous otitis media, bilateral: Secondary | ICD-10-CM | POA: Diagnosis not present

## 2020-10-29 ENCOUNTER — Other Ambulatory Visit: Payer: Self-pay

## 2020-10-29 ENCOUNTER — Encounter: Payer: Self-pay | Admitting: Family Medicine

## 2020-10-29 ENCOUNTER — Ambulatory Visit (INDEPENDENT_AMBULATORY_CARE_PROVIDER_SITE_OTHER): Payer: Medicare Other | Admitting: Family Medicine

## 2020-10-29 VITALS — BP 125/78 | HR 80 | Temp 98.0°F | Ht 65.0 in | Wt 154.4 lb

## 2020-10-29 DIAGNOSIS — J209 Acute bronchitis, unspecified: Secondary | ICD-10-CM

## 2020-10-29 NOTE — Progress Notes (Signed)
Subjective  CC:  Chief Complaint  Patient presents with   Cough    Coughing, green mucus. Having symptoms for 2 days. Covid negative   Same day acute visit; PCP not available. New pt to me. Chart reviewed.    HPI: Amanda [name] is a 70 y.o. female who presents to the office today to address the problems listed above in the chief complaint. Very pleasant 70 year old female who is here for persistent cough.  I reviewed chart.  Her illness started on June 12 after being visited by a young boy who had a URI with significant coughing.  She describes a burning in her upper chest and severe coughing at that time.  She had an urgent care visit on the 13th, and was started on azithromycin for possible bronchitis.  COVID testing has been negative.  She then had a telehealth visit for follow-up.  Medications were continued.  Symptoms are improving however she had persistent cough and because she could not get into our office, went back to the urgent care for recheck.  There, a chest x-ray showed normal lungs.  Vital signs were stable.  However the provider was concerned about possible pneumonia and started Augmentin, albuterol and Flonase.  Patient reports that overall she feels much better.  No longer having burning in the chest.  She has had no fevers, no pleuritic chest pain, no chest pain, and cough has lessened significantly.  However she remains fatigued.  She is stressed because of what she was told yesterday.  She would like a recheck.  Her testing was negative for COVID again 2 days ago.  She is vaccinated against COVID with 1 booster.  She is not immunocompromised.  Appetite is good.  She denies GI symptoms.  Assessment  1. Acute bronchitis, unspecified organism      Plan  Bronchitis versus treated community-acquired pneumonia: Patient clinically looks well.  There is no indication of active pneumonia on today's exam.  Her symptoms are improving.  Her chest x-ray was negative.  Counseling  and education given.  I would not recommend taking the Augmentin.  Treat supportively and give more time for cough to resolve.  Continue rest, good nutrition and hydration.  She is not actively wheezing and does not feel like she has been wheezing.  She may use albuterol if that develops.  Over-the-counter cough medicine can be used to support symptoms. Today's visit was 30 minutes long. Greater than 50% of this time was devoted to face to face counseling with the patient and coordination of care. We discussed her diagnosis, prognosis, treatment options and treatment plan is documented above.  The remainder of the time was used for chart review, chest x-ray review, review of medications and previous chart and documentation   Follow up: As needed Visit date not found  No orders of the defined types were placed in this encounter.  No orders of the defined types were placed in this encounter.     I reviewed the patients updated PMH, FH, and SocHx.    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   History of subdural hematoma 03/26/2019   Herpes simplex type 1 infection 11/01/2018   History of Guillain-Barre syndrome 04/01/2018   Hyperlipidemia  with target LDL less than 100 03/30/2018   Mastalgia, left, followed by GYN 03/29/2018   Fibromyalgia 03/29/2018   Fuchs' corneal dystrophy, 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   Insulin resistance 03/29/2018   History of hysterectomy 03/29/2018   Grade I diastolic dysfunction 60/60/0459   Thyroid nodule 03/27/2018   Presbycusis of both ears 03/10/2017   Dysthymia 09/26/2016   Palpitations 01/17/2013   GERD (gastroesophageal reflux disease)    Restless leg syndrome, on Requip    Allergic  rhinitis 06/25/2009   Diaphragmatic hernia 02/16/2009   IBS (irritable bowel syndrome) 02/16/2009   Celiac disease 11/27/2007   Insomnia 07/05/2007   Osteoarthritis 07/05/2007   Osteopenia 10/03/2006   Left carotid bruit 10/03/2006   History of thyroid cancer 10/03/2006   Current Meds  Medication Sig   Alum Hydroxide-Mag Carbonate (GAVISCON PO) Take by mouth.   aspirin EC 81 MG tablet Take 81 mg by mouth daily.   benzonatate (TESSALON PERLES) 100 MG capsule Take 1 capsule (100 mg total) by mouth 3 (three) times daily as needed.   bisacodyl (DULCOLAX) 5 MG EC tablet Take 5 mg by mouth daily as needed for moderate constipation.   CALCIUM PO Take by mouth. Takes 2   cholecalciferol (VITAMIN D3) 25 MCG (1000 UT) tablet Take 1,000 Units by mouth daily.   clonazePAM (KLONOPIN) 0.5 MG tablet Take 1 tablet (0.5 mg total) by mouth at bedtime as needed for anxiety. (Patient taking differently: Take 0.5 mg by mouth daily.)   CO ENZYME Q-10 PO Take by mouth daily.    Cyanocobalamin (VITAMIN B-12 ER PO) Take 5,000 mcg by mouth daily.   diclofenac sodium (VOLTAREN) 1 % GEL APPLY TOPICALLY TO AFFECTED AREA 4 TIMES A DAY (Patient taking differently: as needed. APPLY TOPICALLY TO AFFECTED AREA 4 TIMES A DAY)   escitalopram (LEXAPRO) 10 MG tablet Take 10 mg by mouth daily.   guaiFENesin-codeine (ROBITUSSIN AC) 100-10 MG/5ML syrup Take 5 mLs by mouth 3 (three) times daily as needed for cough.   Melatonin 3 MG CAPS Take by mouth at bedtime as needed.    metoprolol succinate (TOPROL-XL) 50 MG 24 hr tablet Take 1 tablet (50 mg total) by mouth daily. Take with or immediately following a meal.   Multiple Vitamin (MULTIVITAMIN) tablet Take 1 tablet by mouth daily.   NON FORMULARY Bausch and lomb ointment-Muro-//128, 5%ointment- Each eye nightly   rOPINIRole (REQUIP) 0.25 MG tablet TAKE 1 TABLET BY MOUTH AT BEDTIME   rosuvastatin (CRESTOR) 5 MG tablet TAKE 1 TABLET BY MOUTH EVERY DAY   valACYclovir (VALTREX) 500  MG tablet One tab bid as needed for outbreak    Allergies: Patient is allergic to [employer] tartrate], cortisone, eszopiclone and related, levofloxacin, dicloxacillin, dilantin [phenytoin sodium extended], erythromycin, avelox [moxifloxacin hcl in nacl], influenza vaccines, other, nitrofuran derivatives, phenytoin sodium extended, and trazodone and nefazodone. Family History: Patient family history includes Breast cancer (age of onset: 25) in her maternal aunt; Diabetes in her maternal aunt; Failure to thrive in her mother; Heart disease in her maternal grandfather; Hyperlipidemia in her maternal grandfather; Hypertension in her paternal grandfather; Liver cancer in her father. Social History:  Patient  reports that she has never smoked. She has never used smokeless tobacco. She reports current alcohol use. She reports that she does not use drugs.  Review of Systems: Constitutional: Negative for fever malaise or anorexia Cardiovascular: negative for chest pain Respiratory: negative for SOB positive for hacking cough Gastrointestinal: negative for abdominal pain  Objective  Vitals: BP 125/78   Pulse 80   Temp 98 F (36.7 C) (Temporal)   Ht 5' 5"  (1.651 m)   Wt 154 lb 6.4 oz (70 kg)   LMP  (LMP Unknown)   SpO2 97%   BMI 25.69 kg/m  General: no acute distress , A&Ox3, normal speech HEENT: PEERL, conjunctiva normal, neck is supple, no lymphadenopathy Cardiovascular:  RRR without murmur or gallop.  Respiratory:  Good breath sounds bilaterally, CTAB with normal respiratory effort, no rales, rhonchi or wheezing present Skin:  Warm, no rashes  Chest x-ray from urgent care: No active cardiopulmonary disease.  Normal lung fields.   Commons side effects, risks, benefits, and alternatives for medications and treatment plan prescribed today were discussed, and the patient expressed understanding of the given instructions. Patient is instructed to call or message via MyChart if he/she  has any questions or concerns regarding our treatment plan. No barriers to understanding were identified. We discussed Red Flag symptoms and signs in detail. Patient expressed understanding regarding what to do in case of urgent or emergency type symptoms.  Medication list was reconciled, printed and provided to the patient in AVS. Patient instructions and summary information was reviewed with the patient as documented in the AVS. This note was prepared with assistance of Dragon voice recognition software. Occasional wrong-word or sound-a-like substitutions may have occurred due to the inherent limitations of voice recognition software  This visit occurred during the SARS-CoV-2 public health emergency.  Safety protocols were in place, including screening questions prior to the visit, additional usage of staff PPE, and extensive cleaning of exam room while observing appropriate contact time as indicated for disinfecting solutions.

## 2020-10-29 NOTE — Patient Instructions (Signed)
Please follow up if symptoms do not improve or as needed.    Give time for your symptoms to completely resolve. Rest, hydrate and eat well.  Use cough medications if needed; I like Mucinex DM.   I would hold off on anymore antibiotics. I do not see current evidence of active pneumonia.  It was a pleasure meeting you!

## 2020-11-06 ENCOUNTER — Other Ambulatory Visit: Payer: Self-pay | Admitting: Family Medicine

## 2020-11-06 DIAGNOSIS — Z1231 Encounter for screening mammogram for malignant neoplasm of breast: Secondary | ICD-10-CM

## 2020-11-23 ENCOUNTER — Other Ambulatory Visit: Payer: Self-pay | Admitting: Family Medicine

## 2020-11-23 NOTE — Telephone Encounter (Signed)
Last refill 02/14/20 #90, 1 Last OV: 08/24/20 dx 6 month f/u

## 2020-11-30 DIAGNOSIS — H18523 Epithelial (juvenile) corneal dystrophy, bilateral: Secondary | ICD-10-CM | POA: Diagnosis not present

## 2020-11-30 DIAGNOSIS — Z961 Presence of intraocular lens: Secondary | ICD-10-CM | POA: Diagnosis not present

## 2020-11-30 DIAGNOSIS — H18513 Endothelial corneal dystrophy, bilateral: Secondary | ICD-10-CM | POA: Diagnosis not present

## 2020-11-30 DIAGNOSIS — H04123 Dry eye syndrome of bilateral lacrimal glands: Secondary | ICD-10-CM | POA: Diagnosis not present

## 2020-11-30 DIAGNOSIS — H26493 Other secondary cataract, bilateral: Secondary | ICD-10-CM | POA: Diagnosis not present

## 2020-12-07 ENCOUNTER — Encounter: Payer: Medicare Other | Admitting: Obstetrics and Gynecology

## 2020-12-22 ENCOUNTER — Ambulatory Visit (INDEPENDENT_AMBULATORY_CARE_PROVIDER_SITE_OTHER): Payer: Medicare Other | Admitting: Obstetrics & Gynecology

## 2020-12-22 ENCOUNTER — Encounter: Payer: Self-pay | Admitting: Obstetrics & Gynecology

## 2020-12-22 ENCOUNTER — Other Ambulatory Visit (HOSPITAL_COMMUNITY)
Admission: RE | Admit: 2020-12-22 | Discharge: 2020-12-22 | Disposition: A | Discharge status: Home / Self Care | Payer: Medicare Other | Source: Ambulatory Visit | Attending: Obstetrics & Gynecology | Admitting: Obstetrics & Gynecology

## 2020-12-22 ENCOUNTER — Other Ambulatory Visit: Payer: Self-pay

## 2020-12-22 VITALS — BP 114/72 | HR 72 | Resp 16 | Ht 65.25 in | Wt 153.0 lb

## 2020-12-22 DIAGNOSIS — Z01419 Encounter for gynecological examination (general) (routine) without abnormal findings: Secondary | ICD-10-CM | POA: Diagnosis not present

## 2020-12-22 DIAGNOSIS — M8589 Other specified disorders of bone density and structure, multiple sites: Secondary | ICD-10-CM

## 2020-12-22 DIAGNOSIS — D071 Carcinoma in situ of vulva: Secondary | ICD-10-CM

## 2020-12-22 DIAGNOSIS — Z1272 Encounter for screening for malignant neoplasm of vagina: Secondary | ICD-10-CM

## 2020-12-22 DIAGNOSIS — Z90710 Acquired absence of both cervix and uterus: Secondary | ICD-10-CM

## 2020-12-22 DIAGNOSIS — Z780 Asymptomatic menopausal state: Secondary | ICD-10-CM

## 2020-12-22 NOTE — Progress Notes (Signed)
Amanda [name] August 26, 1950 [id number]   History:    70 y.o. G2P2L2 Married  RP:  Established patient presenting for annual gyn exam   HPI: Postmenopause, well on no HRT.  S/P Total Hysterectomy.  No pelvic pain.  H/O VIN 3.  Wide vulvar excision 03/2019 VIN 3/CIS with 1 margin positive.  Dr Denman George Left Post Vulva and Perianal Excision in 06/2019.  VIN 2-3 with margin positive at 10-3 o'clock.  Missed her f/u with Dr Denman George, will schedule now.  Breasts normal.  BMI 25.27.  Health labs with Fam MD.   Past medical history,surgical history, family history and social history were all reviewed and documented in the EPIC chart.  Gynecologic History No LMP recorded (lmp unknown). Patient has had a hysterectomy.  Obstetric History OB History  Gravida Para Term Preterm AB Living  2 2 2     2   SAB IAB Ectopic Multiple Live Births               # Outcome Date GA Lbr Len/2nd Weight Sex Delivery Anes PTL Lv  2 Term           1 Term              ROS: A ROS was performed and pertinent positives and negatives are included in the history.  GENERAL: No fevers or chills. HEENT: No change in vision, no earache, sore throat or sinus congestion. NECK: No pain or stiffness. CARDIOVASCULAR: No chest pain or pressure. No palpitations. PULMONARY: No shortness of breath, cough or wheeze. GASTROINTESTINAL: No abdominal pain, nausea, vomiting or diarrhea, melena or bright red blood per rectum. GENITOURINARY: No urinary frequency, urgency, hesitancy or dysuria. MUSCULOSKELETAL: No joint or muscle pain, no back pain, no recent trauma. DERMATOLOGIC: No rash, no itching, no lesions. ENDOCRINE: No polyuria, polydipsia, no heat or cold intolerance. No recent change in weight. HEMATOLOGICAL: No anemia or easy bruising or bleeding. NEUROLOGIC: No headache, seizures, numbness, tingling or weakness. PSYCHIATRIC: No depression, no loss of interest in normal activity or change in sleep pattern.     Exam:   BP 114/72    Pulse 72   Resp 16   Ht 5' 5.25" (1.657 m)   Wt 153 lb (69.4 kg)   LMP  (LMP Unknown)   BMI 25.27 kg/m   Body mass index is 25.27 kg/m.  General appearance : Well developed well nourished female. No acute distress HEENT: Eyes: no retinal hemorrhage or exudates,  Neck supple, trachea midline, no carotid bruits, no thyroidmegaly Lungs: Clear to auscultation, no rhonchi or wheezes, or rib retractions  Heart: Regular rate and rhythm, no murmurs or gallops Breast:Examined in sitting and supine position were symmetrical in appearance, no palpable masses or tenderness,  no skin retraction, no nipple inversion, no nipple discharge, no skin discoloration, no axillary or supraclavicular lymphadenopathy Abdomen: no palpable masses or tenderness, no rebound or guarding Extremities: no edema or skin discoloration or tenderness  Pelvic: Vulva: Left vulvar scar             Vagina: No gross lesions or discharge.  Pap reflex done.  Cervix/Uterus absent  Adnexa  Without masses or tenderness  Anus: Normal  Patho 06/2019:  FINAL MICROSCOPIC DIAGNOSIS:   A. VULVA, LEFT POSTERIOR, EXCISION:  - High-grade squamous intraepithelial lesion (VIN2-3, high grade  dysplasia)  - High-grade dysplasia involves 10-3 o'clock margin towards the medial  side  - Tips at 3 o'clock and 9 o'clock and the 4-9 o'clock  margin are  negative for high-grade dysplasia  - No evidence of invasive carcinoma    Assessment/Plan:  70 y.o. female for annual exam   1. Encounter for Papanicolaou smear of vagina as part of routine gynecological examination Normal gynecologic exam except for scarring on the left vulva from previous excisions.  Pap reflex done on the vaginal vault.  Breast exam normal.  Last screening mammogram August 2021 was negative, will schedule again.  Colonoscopy in 2020.  Health labs with family physician.  Good body mass index at 25.27. - Cytology - PAP( [facility])  2. S/P total hysterectomy  3.  Postmenopause Well on no hormone replacement therapy.  4. Osteopenia of multiple sites Osteopenia on BD 12/2019.  Vit D supplements, Ca++ 1.5 g/d total, regular weight bearing physical activities.  5. VIN III (vulvar intraepithelial neoplasia III)  VIN 2-3 with one margin positive 06/2019.  Needs to F/U with Dr Denman George now.  [name] MD, 12:33 PM 12/22/2020

## 2020-12-23 ENCOUNTER — Encounter: Payer: Self-pay | Admitting: Obstetrics & Gynecology

## 2020-12-25 LAB — CYTOLOGY - PAP: Diagnosis: NEGATIVE

## 2020-12-28 ENCOUNTER — Encounter: Payer: Self-pay | Admitting: *Deleted

## 2020-12-31 ENCOUNTER — Other Ambulatory Visit: Payer: Self-pay

## 2020-12-31 ENCOUNTER — Ambulatory Visit
Admission: RE | Admit: 2020-12-31 | Discharge: 2020-12-31 | Disposition: A | Discharge status: Home / Self Care | Payer: Medicare Other | Source: Ambulatory Visit | Attending: Family Medicine | Admitting: Family Medicine

## 2020-12-31 DIAGNOSIS — Z1231 Encounter for screening mammogram for malignant neoplasm of breast: Secondary | ICD-10-CM | POA: Diagnosis not present

## 2021-01-12 ENCOUNTER — Encounter: Payer: Self-pay | Admitting: Gynecologic Oncology

## 2021-01-12 ENCOUNTER — Other Ambulatory Visit: Payer: Self-pay

## 2021-01-12 ENCOUNTER — Inpatient Hospital Stay: Payer: Medicare Other | Attending: Gynecologic Oncology | Admitting: Gynecologic Oncology

## 2021-01-12 VITALS — BP 137/78 | HR 66 | Temp 98.5°F | Resp 20 | Wt 148.4 lb

## 2021-01-12 DIAGNOSIS — Z9079 Acquired absence of other genital organ(s): Secondary | ICD-10-CM | POA: Diagnosis not present

## 2021-01-12 DIAGNOSIS — Z87412 Personal history of vulvar dysplasia: Secondary | ICD-10-CM | POA: Insufficient documentation

## 2021-01-12 DIAGNOSIS — D071 Carcinoma in situ of vulva: Secondary | ICD-10-CM

## 2021-01-12 DIAGNOSIS — Z8544 Personal history of malignant neoplasm of other female genital organs: Secondary | ICD-10-CM | POA: Diagnosis not present

## 2021-01-12 NOTE — Progress Notes (Signed)
Follow-up Note: Gyn-Onc  Consult was initially requested by Dr. Dellis Filbert for the evaluation of Amanda [name] 70 y.o. female  CC:  Chief Complaint  Patient presents with   VIN III (vulvar intraepithelial neoplasia III)    Assessment/Plan:  [name]  is a 70 y.o.  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  No evidence of recurrence.   She will return to see Dr Dellis Filbert for annual surveillance.   HPI: [name] is a 70 year old P2 who was seen in consultation at the request of Dr Dellis Filbert for evaluation of VIN 3.   The patient reported a history of VIN 3 in November 2020.  This was removed by Dr. Phineas Real with a wide local excision on March 12, 2019.  Pathology from that excision revealed VIN 3 with positive margins.  The patient was monitored in the postoperative period and developed symptomatic irritation and a raised area on the posterior left vulvar at the site of the prior excision.  This was biopsied on 71, 2020 and revealed VIN 2-3.  Interval Hx:  On June 17, 2019 she underwent wide local excision with partial simple left posterior vulvectomy.  Intraoperative findings were significant for left posterior vulvar leukoplakia and acetowhite changes measuring approximately 2 cm in crossing the midline at the posterior fourchette.  Surgery was uncomplicated.  Final pathology revealed VIN 2-3 with dysplasia involving the 10 through 3:00 margins towards the medial side  Since surgery she is done well with no complications or concerns. She denied symptoms of recurrence.   Current Meds:  Outpatient Encounter Medications as of 01/12/2021  Medication Sig   Alum Hydroxide-Mag Carbonate (GAVISCON PO) Take by mouth.   aspirin EC 81 MG tablet Take 81 mg by mouth daily.   bisacodyl (DULCOLAX) 5 MG EC tablet Take 5 mg by mouth daily as needed for moderate constipation.   CALCIUM PO Take by mouth. Takes 2    cholecalciferol (VITAMIN D3) 25 MCG (1000 UT) tablet Take 1,000 Units by mouth daily.   clonazePAM (KLONOPIN) 0.5 MG tablet TAKE 1 TABLET BY MOUTH AT BEDTIME AS NEEDED FOR ANXIETY. (Patient taking differently: at bedtime.)   CO ENZYME Q-10 PO Take by mouth daily.    Cyanocobalamin (VITAMIN B-12 ER PO) Take 5,000 mcg by mouth daily.   diclofenac sodium (VOLTAREN) 1 % GEL APPLY TOPICALLY TO AFFECTED AREA 4 TIMES A DAY (Patient taking differently: as needed. APPLY TOPICALLY TO AFFECTED AREA 4 TIMES A DAY)   escitalopram (LEXAPRO) 10 MG tablet Take 10 mg by mouth daily.   Melatonin 3 MG CAPS Take by mouth at bedtime as needed.    metoprolol succinate (TOPROL-XL) 50 MG 24 hr tablet Take 1 tablet (50 mg total) by mouth daily. Take with or immediately following a meal.   Multiple Vitamin (MULTIVITAMIN) tablet Take 1 tablet by mouth daily.   NON FORMULARY Bausch and lomb ointment-Muro-//128, 5%ointment- Each eye nightly   rOPINIRole (REQUIP) 0.25 MG tablet TAKE 1 TABLET BY MOUTH AT BEDTIME   rosuvastatin (CRESTOR) 5 MG tablet TAKE 1 TABLET BY MOUTH EVERY DAY   valACYclovir (VALTREX) 500 MG tablet One tab bid as needed for outbreak   No facility-administered encounter medications on file as of 01/12/2021.    Allergy:  Allergies  Allergen Reactions   Ambien [Zolpidem Tartrate] Other (See Comments)    Causes Neurological problems with very bad dizziness, pains, disorientation   Cortisone Other (  See Comments)    REACTION: "mania" post ESI   Eszopiclone And Related Other (See Comments)    Causes Neurological problems with very bad dizziness, pains, disorientation   Levofloxacin Other (See Comments)    Muscle spasms and jerking movements   Dicloxacillin Other (See Comments)    Stomach problems, swelling of tongue   Dilantin [Phenytoin Sodium Extended] Hives and Itching   Erythromycin Other (See Comments)    Tears stomach up   Avelox [Moxifloxacin Hcl In Nacl]     Pt unsure of reaction.   Influenza  Vaccines     Guillain Barre in the past- 20 years ago   Other     Celiac disease   Nitrofuran Derivatives Other (See Comments)    unknown   Phenytoin Sodium Extended Rash   Trazodone And Nefazodone Other (See Comments)    dizzy    Social Hx:   Social History   Socioeconomic History   Marital status: Married    Spouse name: Not on file   Number of children: 2   Years of education: Not on file   Highest education level: Not on file  Occupational History   Not on file  Tobacco Use   Smoking status: Never   Smokeless tobacco: Never  Vaping Use   Vaping Use: Never used  Substance and Sexual Activity   Alcohol use: Yes    Comment: 2-3 times weekly   Drug use: Never   Sexual activity: Not Currently    Partners: Male    Birth control/protection: Surgical    Comment: HYST-1st intercourse 70 yo-Fewer than 5 partners  Other Topics Concern   Not on file  Social History Narrative   Married mother of 15 with 39 year old granddaughter (born in 2019) and another due in 2022. Her son Thurmond Butts is patient of Dr. Yong Channel       Lives with husband in a three story home, no issue with stairs.    Highest level of education is an associates.       She exercises regularly, leads an active lifestyle, has a healthy diet, and is very socially engaged. Self-described "HSP: highly sensitive person" and therefore tends to worry more about symptoms than others might.      Hobbies: enjoys time with friends, time on front porch with friends during covid, park daily, family time   Social Determinants of Health   Financial Resource Strain: Not on file  Food Insecurity: Not on file  Transportation Needs: Not on file  Physical Activity: Not on file  Stress: Not on file  Social Connections: Not on file  Intimate Partner Violence: Not on file    Past Surgical Hx:  Past Surgical History:  Procedure Laterality Date   ABDOMINAL HYSTERECTOMY  1986   ovaries remain   BREAST REDUCTION SURGERY Bilateral 1999    CATARACT EXTRACTION W/ [place], BILATERAL  2019   COLONOSCOPY  last one 11-22-2018   ESOPHAGOGASTRODUODENOSCOPY     KNEE ARTHROSCOPY Bilateral 2002   SUBDURAL HEMATOMA EVACUATION VIA CRANIOTOMY  04-30-2009  @[facility]    left frontotemporapartietal    THYROID LOBECTOMY Left 05-04-2001  @[facility]   dr Constance Holster   VULVECTOMY N/A 03/12/2019   Procedure: WIDE LOCAL EXCISION OF CARCINOMA IS SITU OF VULVA;  Surgeon: Anastasio Auerbach, MD;  Location: [facility];  Service: Gynecology;  Laterality: N/A;  request to follow in [place] block time requests one hour colposcope available in OR with acetic acid.   VULVECTOMY  N/A 06/17/2019   Procedure: WIDE LOCAL  EXCISION VULVECTOMY;  Surgeon: Everitt Amber, MD;  Location: [facility];  Service: Gynecology;  Laterality: N/A;    Past Medical Hx:  Past Medical History:  Diagnosis Date   Anxiety    Celiac disease    Chronic constipation    COVID-19 virus vaccine not available    GERD (gastroesophageal reflux disease)    occasionally (per pt takes apple cider vinager)   Heart palpitations    cardiologist-- dr Ellyn Hack--- event monitor 08-24-2011 epic ;  nuclear stress test 03-16-2011 (epic) normal w/ no ischemia, ef 65%;  echo 01-18-2013  ef 60-65%, G1DD   Hiatal hernia    History of subdural hematoma    12/ 23/ 2010  s/p  craniotomy w/ hematoma evacuation (pt had a fall w/ concussion)  per pt no residual   History of thyroid cancer followed by dr Constance Holster---   s/p  left thyroidectomy --- per pt no radiation and no recurrence   Hyperlipidemia    Immunization, single disease 06/09/2019   1st dose moderma vaccine administered   Insomnia    OA (osteoarthritis)    Osteopenia, T score -2.1 FRAX 9.4%/1.3% stable from prior DEXA 06/2015   Restless leg syndrome    Right thyroid nodule    followed by dr Constance Holster--- last ultrasound in epic 03-30-2018 stable , to bx   STD (sexually transmitted disease), HSV    Vulvar  intraepithelial neoplasia (VIN) grade 3 12/2018   Wears glasses     Past Gynecological History:  See HPI No LMP recorded (lmp unknown). Patient has had a hysterectomy.  Family Hx:  Family History  Problem Relation Age of Onset   Diabetes Maternal Aunt    Breast cancer Maternal Aunt 60   Liver cancer Father        alcohol related likely   Failure to thrive Mother        6   Heart disease Maternal Grandfather    Hyperlipidemia Maternal Grandfather    Hypertension Paternal Grandfather    Colon cancer Neg Hx    Thyroid disease Neg Hx     Review of Systems:  Constitutional  Feels well,    ENT Normal appearing ears and nares bilaterally Skin/Breast  No rash, sores, jaundice, itching, dryness Cardiovascular  No chest pain, shortness of breath, or edema  Pulmonary  No cough or wheeze.  Gastro Intestinal  No nausea, vomitting, or diarrhoea. No bright red blood per rectum, no abdominal pain, change in bowel movement, or constipation.  Genito Urinary  No frequency, urgency, dysuria, no vulvar pruritis.  Musculo Skeletal  No myalgia, arthralgia, joint swelling or pain  Neurologic  No weakness, numbness, change in gait,  Psychology  No depression, anxiety, insomnia.   Vitals:  Blood pressure 137/78, pulse 66, temperature 98.5 F (36.9 C), resp. rate 20, weight 148 lb 6.4 oz (67.3 kg), SpO2 99 %.  Physical Exam: WD in NAD Neck  Supple NROM, without any enlargements.  Lymph Node Survey No cervical supraclavicular or inguinal adenopathy Cardiovascular  Pulse normal rate, regularity and rhythm. S1 and S2 normal.  Lungs  Clear to auscultation bilateraly, without wheezes/crackles/rhonchi. Good air movement.  Skin  No rash/lesions/breakdown  Psychiatry  Alert and oriented to person, place, and time  Abdomen  Normoactive bowel sounds, abdomen soft, non-tender and nonobese without evidence of hernia. Back No CVA tenderness Genito Urinary  Vulva/vagina: incision and  slightly erythematous. Acetic acid applied to vulva. No lesions identified.  Rectal  Good tone, no masses no cul de sac nodularity.  Extremities  No bilateral cyanosis, clubbing or edema.   Thereasa Solo, MD  01/12/2021, 3:41 PM

## 2021-01-12 NOTE — Patient Instructions (Signed)
Dr Denman George is recommending that you follow-up with Dr Dellis Filbert in 1 year's time.  Please notify Dr Dellis Filbert if you notice vulvar itch, a painless lump or bump, vulvar skin discoloration.

## 2021-01-14 ENCOUNTER — Ambulatory Visit (INDEPENDENT_AMBULATORY_CARE_PROVIDER_SITE_OTHER): Payer: Medicare Other | Admitting: Physician Assistant

## 2021-01-14 ENCOUNTER — Encounter: Payer: Self-pay | Admitting: Physician Assistant

## 2021-01-14 ENCOUNTER — Other Ambulatory Visit: Payer: Self-pay

## 2021-01-14 VITALS — BP 124/72 | HR 66 | Ht 65.0 in | Wt 152.6 lb

## 2021-01-14 DIAGNOSIS — E785 Hyperlipidemia, unspecified: Secondary | ICD-10-CM

## 2021-01-14 DIAGNOSIS — R002 Palpitations: Secondary | ICD-10-CM

## 2021-01-14 MED ORDER — METOPROLOL SUCCINATE ER 25 MG PO TB24: 25.0000 mg | ORAL_TABLET | Freq: Every day | ORAL | 3 refills | Status: DC

## 2021-01-14 NOTE — Patient Instructions (Signed)
Medication Instructions:  DECREASE Metoprolol Succinate (Toprol-XL) to 25 mg daily  *If you need a refill on your cardiac medications before your next appointment, please call your pharmacy*  Lab Work: NONE ordered at this time of appointment   If you have labs (blood work) drawn today and your tests are completely normal, you will receive your results only by: [place] (if you have MyChart) OR A paper copy in the mail If you have any lab test that is abnormal or we need to change your treatment, we will call you to review the results.  Testing/Procedures: NONE ordered at this time of appointment   Follow-Up: At [facility], you and your health needs are our priority.  As part of our continuing mission to provide you with exceptional heart care, we have created designated Provider Care Teams.  These Care Teams include your primary Cardiologist (physician) and Advanced Practice Providers (APPs -  Physician Assistants and Nurse Practitioners) who all work together to provide you with the care you need, when you need it.  Your next appointment:   6 month(s)  The format for your next appointment:   In Person  Provider:   Glenetta Hew, MD  Other Instructions

## 2021-01-14 NOTE — Progress Notes (Signed)
Cardiology Office Note:    Date:  01/16/2021   ID:  Amanda [name], DOB 1950/07/01, MRN [id number]  PCP:  Marin Olp, MD   [facility] Providers Cardiologist:  Glenetta Hew, MD     Referring MD: Marin Olp, MD   Chief Complaint  Patient presents with   Follow-up    Seen for Dr. Ellyn Hack    History of Present Illness:    [name] is a 70 y.o. female with a hx of celiac disease, GERD, palpitation, hyperlipidemia, restless leg syndrome, and a history of atypical chest pain.  She is a former patient of Dr. Rollene Fare. She has been unable to tolerate statins because of memory issues and this was changed to Zetia.  Previous treadmill nuclear stress test obtained in November 2012 showed EF 65%, patient was able to achieve 11 METS of activity, there was no significant ischemia demonstrated, overall low risk study.  Echocardiogram obtained on 01/18/2013 showed EF 60 to 65%, grade 1 DD.  She was last seen by Dr. Ellyn Hack in October 2021, given her risk factors, coronary calcium scoring was recommended, however she was not ready to proceed at the time.  She was able to tolerate a very low-dose of Crestor 5 mg.    I last saw the patient on 09/11/2020 at which time she complained of worsening palpitation since February.  She wore a heart monitor in 2013 for evaluation of palpitation.  She mentioned that although her previous palpitation in the past has always been skipped heartbeat, she has started noticing short bursts of tachycardia palpitation recently as well.  The duration is still only a few seconds at a time.  I recommend he increase metoprolol succinate to 50 mg daily. TSH and free T4 were normal.   Patient presents today for follow-up.  She continues to have palpitation, however the persistent palpitation continues to be fairly rare and very transient.  Average duration of palpitations still around 2 to 3 seconds.  She typically notices it after drinking 5 hour energy or  sitting down resting.  She tried the higher dose of metoprolol succinate 50 mg however this dose was making her too fatigued.  She eventually went back down to the metoprolol succinate 25 mg daily.  I will keep her on the lower dose for now.  Last lipid panel obtained in April showed very well-controlled cholesterol.  Given the infrequency and transient duration of her palpitation, we recommended continue monitoring.  Her symptom is too transient to consider outpatient rhythm monitor such as apple watch or Kardia mobile device.  If her symptoms become more prolonged or more frequent, we can consider running another heart monitor.  Otherwise she can follow-up with Dr. Ellyn Hack in 6 months.   Past Medical History:  Diagnosis Date   Anxiety    Celiac disease    Chronic constipation    COVID-19 virus vaccine not available    GERD (gastroesophageal reflux disease)    occasionally (per pt takes apple cider vinager)   Heart palpitations    cardiologist-- dr Ellyn Hack--- event monitor 08-24-2011 epic ;  nuclear stress test 03-16-2011 (epic) normal w/ no ischemia, ef 65%;  echo 01-18-2013  ef 60-65%, G1DD   Hiatal hernia    History of subdural hematoma    12/ 23/ 2010  s/p  craniotomy w/ hematoma evacuation (pt had a fall w/ concussion)  per pt no residual   History of thyroid cancer followed by dr Constance Holster---   s/p  left thyroidectomy --- per pt no radiation and no recurrence   Hyperlipidemia    Immunization, single disease 06/09/2019   1st dose moderma vaccine administered   Insomnia    OA (osteoarthritis)    Osteopenia, T score -2.1 FRAX 9.4%/1.3% stable from prior DEXA 06/2015   Restless leg syndrome    Right thyroid nodule    followed by dr Constance Holster--- last ultrasound in epic 03-30-2018 stable , to bx   STD (sexually transmitted disease), HSV    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   Wears glasses     Past Surgical History:  Procedure Laterality Date   ABDOMINAL HYSTERECTOMY   1986   ovaries remain   BREAST REDUCTION SURGERY Bilateral 1999   CATARACT EXTRACTION W/ INTRAOCULAR LENS  IMPLANT, BILATERAL  2019   COLONOSCOPY  last one 11-22-2018   ESOPHAGOGASTRODUODENOSCOPY     KNEE ARTHROSCOPY Bilateral 2002   SUBDURAL HEMATOMA EVACUATION VIA CRANIOTOMY  04-30-2009  @[facility]    left frontotemporapartietal    THYROID LOBECTOMY Left 05-04-2001  @[facility]   dr Constance Holster   VULVECTOMY N/A 03/12/2019   Procedure: WIDE LOCAL EXCISION OF CARCINOMA IS SITU OF VULVA;  Surgeon: Anastasio Auerbach, MD;  Location: [place];  Service: Gynecology;  Laterality: N/A;  request to follow in [place] block time requests one hour colposcope available in OR with acetic acid.   VULVECTOMY N/A 06/17/2019   Procedure: WIDE LOCAL  EXCISION VULVECTOMY;  Surgeon: Everitt Amber, MD;  Location: [facility];  Service: Gynecology;  Laterality: N/A;    Current Medications: Current Meds  Medication Sig   Alum Hydroxide-Mag Carbonate (GAVISCON PO) Take by mouth.   aspirin EC 81 MG tablet Take 81 mg by mouth daily.   bisacodyl (DULCOLAX) 5 MG EC tablet Take 5 mg by mouth daily as needed for moderate constipation.   CALCIUM PO Take by mouth. Takes 2   cholecalciferol (VITAMIN D3) 25 MCG (1000 UT) tablet Take 1,000 Units by mouth daily.   clonazePAM (KLONOPIN) 0.5 MG tablet TAKE 1 TABLET BY MOUTH AT BEDTIME AS NEEDED FOR ANXIETY. (Patient taking differently: at bedtime.)   CO ENZYME Q-10 PO Take by mouth daily.    Cyanocobalamin (VITAMIN B-12 ER PO) Take 5,000 mcg by mouth daily.   diclofenac sodium (VOLTAREN) 1 % GEL APPLY TOPICALLY TO AFFECTED AREA 4 TIMES A DAY   escitalopram (LEXAPRO) 10 MG tablet Take 10 mg by mouth daily.   Melatonin 3 MG CAPS Take by mouth at bedtime as needed.    Multiple Vitamin (MULTIVITAMIN) tablet Take 1 tablet by mouth daily.   NON FORMULARY Bausch and lomb ointment-Muro-//128, 5%ointment- Each eye nightly   rOPINIRole (REQUIP) 0.25 MG tablet TAKE 1  TABLET BY MOUTH AT BEDTIME   rosuvastatin (CRESTOR) 5 MG tablet TAKE 1 TABLET BY MOUTH EVERY DAY   valACYclovir (VALTREX) 500 MG tablet One tab bid as needed for outbreak   [DISCONTINUED] metoprolol succinate (TOPROL-XL) 50 MG 24 hr tablet Take 1 tablet (50 mg total) by mouth daily. Take with or immediately following a meal.     Allergies:   Ambien [zolpidem tartrate], Cortisone, Eszopiclone and related, Levofloxacin, Dicloxacillin, Dilantin [phenytoin sodium extended], Erythromycin, Avelox [moxifloxacin hcl in nacl], Influenza vaccines, Other, Nitrofuran derivatives, Phenytoin sodium extended, and Trazodone and nefazodone   Social History   Socioeconomic History   Marital status: Married    Spouse name: Not on file   Number of children: 2   Years of education: Not  on file   Highest education level: Not on file  Occupational History   Not on file  Tobacco Use   Smoking status: Never   Smokeless tobacco: Never  Vaping Use   Vaping Use: Never used  Substance and Sexual Activity   Alcohol use: Yes    Comment: 2-3 times weekly   Drug use: Never   Sexual activity: Not Currently    Partners: Male    Birth control/protection: Surgical    Comment: HYST-1st intercourse 70 yo-Fewer than 5 partners  Other Topics Concern   Not on file  Social History Narrative   Married mother of 63 with 24 year old granddaughter (born in 2019) and another due in 2022. Her son Thurmond Butts is patient of Dr. Yong Channel       Lives with husband in a three story home, no issue with stairs.    Highest level of education is an associates.       She exercises regularly, leads an active lifestyle, has a healthy diet, and is very socially engaged. Self-described "HSP: highly sensitive person" and therefore tends to worry more about symptoms than others might.      Hobbies: enjoys time with friends, time on front porch with friends during covid, park daily, family time   Social Determinants of Health   Financial Resource  Strain: Not on file  Food Insecurity: Not on file  Transportation Needs: Not on file  Physical Activity: Not on file  Stress: Not on file  Social Connections: Not on file     Family History: The patient's family history includes Breast cancer (age of onset: 84) in her maternal aunt; Diabetes in her maternal aunt; Failure to thrive in her mother; Heart disease in her maternal grandfather; Hyperlipidemia in her maternal grandfather; Hypertension in her paternal grandfather; Liver cancer in her father. There is no history of Colon cancer or Thyroid disease.  ROS:   Please see the history of present illness.     All other systems reviewed and are negative.  EKGs/Labs/Other Studies Reviewed:    The following studies were reviewed today:  Echo 01/18/2013 LV EF: 60% -   65%  Study Conclusions   - Left ventricle: The cavity size was normal. Wall thickness    was normal. Systolic function was normal. The estimated    ejection fraction was in the range of 60% to 65%. Wall    motion was normal; there were no regional wall motion    abnormalities. Doppler parameters are consistent with    abnormal left ventricular relaxation (grade 1 diastolic    dysfunction). The E/e' ratio is <10, suggesting normal LV    filling pressure.  - Left atrium: The atrium was normal in size.  - Inferior vena cava: The vessel was normal in size; the    respirophasic diameter changes were in the normal range (=    50%); findings are consistent with normal central venous    pressure.   EKG:  EKG is not ordered today.    Recent Labs: 02/14/2020: Hemoglobin 12.6; Platelets 247 08/24/2020: ALT 24; BUN 19; Creatinine, Ser 0.80; Potassium 4.5; Sodium 137 09/11/2020: TSH 1.570  Recent Lipid Panel    Component Value Date/Time   CHOL 162 08/24/2020 1426   CHOL 315 (H) 05/15/2015 1136   TRIG 89.0 08/24/2020 1426   TRIG 92 05/15/2015 1136   TRIG 70 04/12/2006 1129   HDL 64.70 08/24/2020 1426   HDL 84 05/15/2015 1136    CHOLHDL 3 08/24/2020  1426   VLDL 17.8 08/24/2020 1426   LDLCALC 80 08/24/2020 1426   LDLCALC 121 (H) 02/14/2020 1603   LDLCALC 213 (H) 05/15/2015 1136   LDLDIRECT 139.2 03/10/2008 1036     Risk Assessment/Calculations:           Physical Exam:    VS:  BP 124/72   Pulse 66   Ht 5' 5"  (1.651 m)   Wt 152 lb 9.6 oz (69.2 kg)   LMP  (LMP Unknown)   SpO2 95%   BMI 25.39 kg/m     Wt Readings from Last 3 Encounters:  01/14/21 152 lb 9.6 oz (69.2 kg)  01/12/21 148 lb 6.4 oz (67.3 kg)  12/22/20 153 lb (69.4 kg)     GEN:  Well nourished, well developed in no acute distress HEENT: Normal NECK: No JVD; No carotid bruits LYMPHATICS: No lymphadenopathy CARDIAC: RRR, no murmurs, rubs, gallops RESPIRATORY:  Clear to auscultation without rales, wheezing or rhonchi  ABDOMEN: Soft, non-tender, non-distended MUSCULOSKELETAL:  No edema; No deformity  SKIN: Warm and dry NEUROLOGIC:  Alert and oriented x 3 PSYCHIATRIC:  Normal affect   ASSESSMENT:    1. Palpitations   2. Hyperlipidemia LDL goal <100    PLAN:    In order of problems listed above:  Palpitation: I previously recommended she increase the metoprolol succinate to 50 mg daily, however she could not tolerate it due to significant fatigue.  Given the rarity and the transient nature of her palpitation, I decided to keep her metoprolol succinate at 25 mg daily.  Hyperlipidemia: Continue Crestor        Medication Adjustments/Labs and Tests Ordered: Current medicines are reviewed at length with the patient today.  Concerns regarding medicines are outlined above.  No orders of the defined types were placed in this encounter.  Meds ordered this encounter  Medications   metoprolol succinate (TOPROL-XL) 25 MG 24 hr tablet    Sig: Take 1 tablet (25 mg total) by mouth daily. Take with or immediately following a meal.    Dispense:  90 tablet    Refill:  3    Patient Instructions  Medication Instructions:  DECREASE  Metoprolol Succinate (Toprol-XL) to 25 mg daily  *If you need a refill on your cardiac medications before your next appointment, please call your pharmacy*  Lab Work: NONE ordered at this time of appointment   If you have labs (blood work) drawn today and your tests are completely normal, you will receive your results only by: [place] (if you have MyChart) OR A paper copy in the mail If you have any lab test that is abnormal or we need to change your treatment, we will call you to review the results.  Testing/Procedures: NONE ordered at this time of appointment   Follow-Up: At [facility], you and your health needs are our priority.  As part of our continuing mission to provide you with exceptional heart care, we have created designated Provider Care Teams.  These Care Teams include your primary Cardiologist (physician) and Advanced Practice Providers (APPs -  Physician Assistants and Nurse Practitioners) who all work together to provide you with the care you need, when you need it.  Your next appointment:   6 month(s)  The format for your next appointment:   In Person  Provider:   Glenetta Hew, MD  Other Instructions    Signed, Almyra Deforest, [place]  01/16/2021 9:38 PM    [place]

## 2021-01-16 ENCOUNTER — Encounter: Payer: Self-pay | Admitting: Physician Assistant

## 2021-01-20 ENCOUNTER — Ambulatory Visit (INDEPENDENT_AMBULATORY_CARE_PROVIDER_SITE_OTHER): Payer: Medicare Other | Admitting: Family Medicine

## 2021-01-20 ENCOUNTER — Other Ambulatory Visit: Payer: Self-pay

## 2021-01-20 ENCOUNTER — Ambulatory Visit (INDEPENDENT_AMBULATORY_CARE_PROVIDER_SITE_OTHER)
Admission: RE | Admit: 2021-01-20 | Discharge: 2021-01-20 | Disposition: A | Discharge status: Home / Self Care | Payer: Medicare Other | Source: Ambulatory Visit | Attending: Family Medicine | Admitting: Family Medicine

## 2021-01-20 ENCOUNTER — Encounter: Payer: Self-pay | Admitting: Family Medicine

## 2021-01-20 VITALS — BP 136/82 | HR 72 | Temp 98.0°F | Ht 65.0 in | Wt 152.6 lb

## 2021-01-20 DIAGNOSIS — R3589 Other polyuria: Secondary | ICD-10-CM | POA: Diagnosis not present

## 2021-01-20 DIAGNOSIS — R0789 Other chest pain: Secondary | ICD-10-CM

## 2021-01-20 DIAGNOSIS — R7301 Impaired fasting glucose: Secondary | ICD-10-CM | POA: Diagnosis not present

## 2021-01-20 DIAGNOSIS — M940 Chondrocostal junction syndrome [Tietze]: Secondary | ICD-10-CM

## 2021-01-20 DIAGNOSIS — M797 Fibromyalgia: Secondary | ICD-10-CM | POA: Diagnosis not present

## 2021-01-20 LAB — POCT GLYCOSYLATED HEMOGLOBIN (HGB A1C): Hemoglobin A1C: 5.3 % (ref 4.0–5.6)

## 2021-01-20 MED ORDER — DICLOFENAC SODIUM 75 MG PO TBEC
75.0000 mg | DELAYED_RELEASE_TABLET | Freq: Two times a day (BID) | ORAL | 0 refills | Status: DC
Start: 2021-01-20 — End: 2021-03-16

## 2021-01-20 NOTE — Patient Instructions (Signed)
Please follow up if symptoms do not improve or as needed.    Please go to our [facility] office to get your xrays done. You can walk in M-F between 8:30am- noon or 1pm - 5pm. Tell them you are there for xrays ordered by me. They will send me the results, then I will let you know the results with instructions.   Address: [address].  The Xray department is located in the basement.   Take the voltaren twice a day with food to help your pain; you can continue for 1-2 weeks.   I will get your chest xray results to you soon via Mychart.

## 2021-01-20 NOTE — Progress Notes (Signed)
Subjective  CC:  Chief Complaint  Patient presents with   Fatigue   Chest Pain    Rib pain   Same day / acute care visit. PCP not available. Chart reviewed.   HPI: Amanda [name] is a 70 y.o. female who presents to the office today to address the problems listed above in the chief complaint. Very pleasant 70 yo female c/o 2 week h/o right sided lower chest wall pain. Noted abruptly while in the shower and reached upwards with left arm: felt sharp pain that started in her later upper abdomen and tracked upward toward her lower left chest wall/ribs. Since, she has had daily pain, mostly with movements. No pleuritic chest pain, sob, or GI sxs. Nof/c/s. No rash. No injury or overuse activities/straining. Has h/o fibromyalgia but this has not been a recent active problem. She has used advil and topical voltaren with some relief. Does not affect sleep. Has h/o costochondritis and this feels somewhat similar. Non smoker. No cough.  C/o polyuria w/o dysuria and a sweet smell to urine: has h/o mildly elevated a1c: 5.8 in April and wants this to be checked again. No polydipsia or blurred vision. No other irritative bladder sxs.                                                                                       Assessment  1. Acute chest wall pain   2. Costochondritis   3. Polyuria   4. IFG (impaired fasting glucose)   5. Fibromyalgia      Plan  Left chest wall pain:  suspect costochondritis but recommend imaging studies given localized bony ttp and age to r/o bony lesion. Start voltaren bid and monitor for resolution. No red flag sxs now. Benign abdomen  IFG: resolved. Nl a1c today. Monitor urinary sxs: return for further evaluation if persists or if new sxs develop.  Fibromyalgia; discussed this pain could be a flare.   Follow up: as needed  Visit date not found  Orders Placed This Encounter  Procedures   DG Ribs Unilateral W/Chest Right   POCT glycosylated hemoglobin (Hb A1C)    Meds ordered this encounter  Medications   diclofenac (VOLTAREN) 75 MG EC tablet    Sig: Take 1 tablet (75 mg total) by mouth 2 (two) times daily.    Dispense:  30 tablet    Refill:  0      I reviewed the patients updated PMH, FH, and SocHx.    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   History of subdural hematoma 03/26/2019   Herpes simplex type 1 infection 11/01/2018   History of Guillain-Barre syndrome 04/01/2018   Hyperlipidemia with target LDL less than 100 03/30/2018   Mastalgia, left, followed by GYN 03/29/2018   Fibromyalgia 03/29/2018   Fuchs' corneal dystrophy, 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   Insulin resistance 03/29/2018   History of hysterectomy  03/29/2018   Grade I diastolic dysfunction 02/58/5277   Thyroid nodule 03/27/2018   Presbycusis of both ears 03/10/2017   Dysthymia 09/26/2016   Palpitations 01/17/2013   GERD (gastroesophageal reflux disease)    Restless leg syndrome, on Requip    Allergic rhinitis 06/25/2009   Diaphragmatic hernia 02/16/2009   IBS (irritable bowel syndrome) 02/16/2009   Celiac disease 11/27/2007   Insomnia 07/05/2007   Osteoarthritis 07/05/2007   Osteopenia 10/03/2006   Left carotid bruit 10/03/2006   History of thyroid cancer 10/03/2006   Current Meds  Medication Sig   Alum Hydroxide-Mag Carbonate (GAVISCON PO) Take by mouth.   aspirin EC 81 MG tablet Take 81 mg by mouth daily.   bisacodyl (DULCOLAX) 5 MG EC tablet Take 5 mg by mouth daily as needed for moderate constipation.   CALCIUM PO Take by mouth. Takes 2   cholecalciferol (VITAMIN D3) 25 MCG (1000 UT) tablet Take 1,000 Units by mouth daily.   clonazePAM (KLONOPIN) 0.5 MG tablet TAKE 1 TABLET BY MOUTH AT  BEDTIME AS NEEDED FOR ANXIETY. (Patient taking differently: at bedtime.)   CO ENZYME Q-10 PO Take by mouth daily.    Cyanocobalamin (VITAMIN B-12 ER PO) Take 5,000 mcg by mouth daily.   diclofenac (VOLTAREN) 75 MG EC tablet Take 1 tablet (75 mg total) by mouth 2 (two) times daily.   diclofenac sodium (VOLTAREN) 1 % GEL APPLY TOPICALLY TO AFFECTED AREA 4 TIMES A DAY   escitalopram (LEXAPRO) 10 MG tablet Take 10 mg by mouth daily.   Melatonin 3 MG CAPS Take by mouth at bedtime as needed.    metoprolol succinate (TOPROL-XL) 25 MG 24 hr tablet Take 1 tablet (25 mg total) by mouth daily. Take with or immediately following a meal.   Multiple Vitamin (MULTIVITAMIN) tablet Take 1 tablet by mouth daily.   NON FORMULARY Bausch and lomb ointment-Muro-//128, 5%ointment- Each eye nightly   rOPINIRole (REQUIP) 0.25 MG tablet TAKE 1 TABLET BY MOUTH AT BEDTIME   rosuvastatin (CRESTOR) 5 MG tablet TAKE 1 TABLET BY MOUTH EVERY DAY   valACYclovir (VALTREX) 500 MG tablet One tab bid as needed for outbreak    Allergies: Patient is allergic to [employer] tartrate], cortisone, eszopiclone and related, levofloxacin, dicloxacillin, dilantin [phenytoin sodium extended], erythromycin, avelox [moxifloxacin hcl in nacl], influenza vaccines, other, nitrofuran derivatives, phenytoin sodium extended, and trazodone and nefazodone. Family History: Patient family history includes Breast cancer (age of onset: 25) in her maternal aunt; Diabetes in her maternal aunt; Failure to thrive in her mother; Heart disease in her maternal grandfather; Hyperlipidemia in her maternal grandfather; Hypertension in her paternal grandfather; Liver cancer in her father. Social History:  Patient  reports that she has never smoked. She has never used smokeless tobacco. She reports current alcohol use. She reports that she does not use drugs.  Review of Systems: Constitutional: Negative for fever malaise or anorexia Cardiovascular: negative  for chest pain Respiratory: negative for SOB or persistent cough Gastrointestinal: negative for abdominal pain  Objective  Vitals: BP 136/82   Pulse 72   Temp 98 F (36.7 C) (Temporal)   Ht 5' 5"  (1.651 m)   Wt 152 lb 9.6 oz (69.2 kg)   LMP  (LMP Unknown)   SpO2 96%   BMI 25.39 kg/m  General: no acute distress , A&Ox3, moves well/easily HEENT: PEERL, conjunctiva normal, neck is supple Cardiovascular:  RRR without murmur or gallop.  Respiratory:  Good breath sounds bilaterally, CTAB with normal respiratory effort Right lower  anterior ribs with ttp w/o nodules, masses or crepitus. No rash or ecchymosis. + bilateraly costochondral junction ttp bilaterally Gastrointestinal: soft, flat abdomen, normal active bowel sounds, no palpable masses, no hepatosplenomegaly, no appreciated hernias, no ttp. Skin:  Warm, no rashes  Office Visit on 01/20/2021  Component Date Value Ref Range Status   Hemoglobin A1C 01/20/2021 5.3  4.0 - 5.6 % Final   Lab Results  Component Value Date   WBC 5.4 02/14/2020   HGB 12.6 02/14/2020   HCT 38.2 02/14/2020   MCV 91.6 02/14/2020   PLT 247 02/14/2020   Lab Results  Component Value Date   CREATININE 0.80 08/24/2020   BUN 19 08/24/2020   NA 137 08/24/2020   K 4.5 08/24/2020   CL 101 08/24/2020   CO2 28 08/24/2020     Commons side effects, risks, benefits, and alternatives for medications and treatment plan prescribed today were discussed, and the patient expressed understanding of the given instructions. Patient is instructed to call or message via MyChart if he/she has any questions or concerns regarding our treatment plan. No barriers to understanding were identified. We discussed Red Flag symptoms and signs in detail. Patient expressed understanding regarding what to do in case of urgent or emergency type symptoms.  Medication list was reconciled, printed and provided to the patient in AVS. Patient instructions and summary information was reviewed  with the patient as documented in the AVS. This note was prepared with assistance of Dragon voice recognition software. Occasional wrong-word or sound-a-like substitutions may have occurred due to the inherent limitations of voice recognition software  This visit occurred during the SARS-CoV-2 public health emergency.  Safety protocols were in place, including screening questions prior to the visit, additional usage of staff PPE, and extensive cleaning of exam room while observing appropriate contact time as indicated for disinfecting solutions.

## 2021-01-25 DIAGNOSIS — L820 Inflamed seborrheic keratosis: Secondary | ICD-10-CM | POA: Diagnosis not present

## 2021-01-25 DIAGNOSIS — D1801 Hemangioma of skin and subcutaneous tissue: Secondary | ICD-10-CM | POA: Diagnosis not present

## 2021-01-25 DIAGNOSIS — Z85828 Personal history of other malignant neoplasm of skin: Secondary | ICD-10-CM | POA: Diagnosis not present

## 2021-01-27 DIAGNOSIS — M25511 Pain in right shoulder: Secondary | ICD-10-CM | POA: Diagnosis not present

## 2021-01-27 DIAGNOSIS — M19019 Primary osteoarthritis, unspecified shoulder: Secondary | ICD-10-CM | POA: Diagnosis not present

## 2021-01-27 DIAGNOSIS — M25812 Other specified joint disorders, left shoulder: Secondary | ICD-10-CM | POA: Diagnosis not present

## 2021-01-27 DIAGNOSIS — M25512 Pain in left shoulder: Secondary | ICD-10-CM | POA: Diagnosis not present

## 2021-01-28 DIAGNOSIS — M1712 Unilateral primary osteoarthritis, left knee: Secondary | ICD-10-CM | POA: Diagnosis not present

## 2021-02-04 ENCOUNTER — Telehealth: Payer: Self-pay | Admitting: Family Medicine

## 2021-02-04 DIAGNOSIS — M1712 Unilateral primary osteoarthritis, left knee: Secondary | ICD-10-CM | POA: Diagnosis not present

## 2021-02-04 NOTE — Telephone Encounter (Signed)
Copied from [place] [id number]. Topic: Medicare AWV >> Feb 04, 2021  9:59 AM Harris-Coley, Hannah Beat wrote: Reason for CRM: Left message for patient to schedule Annual Wellness Visit.  Please schedule with Nurse Health Advisor Charlott Rakes, RN at [facility].  Please call [phone] ask for [facility]

## 2021-02-11 DIAGNOSIS — M25512 Pain in left shoulder: Secondary | ICD-10-CM | POA: Diagnosis not present

## 2021-02-11 DIAGNOSIS — M25511 Pain in right shoulder: Secondary | ICD-10-CM | POA: Diagnosis not present

## 2021-02-11 DIAGNOSIS — M1712 Unilateral primary osteoarthritis, left knee: Secondary | ICD-10-CM | POA: Diagnosis not present

## 2021-02-12 DIAGNOSIS — M25812 Other specified joint disorders, left shoulder: Secondary | ICD-10-CM | POA: Diagnosis not present

## 2021-02-12 DIAGNOSIS — U071 COVID-19: Secondary | ICD-10-CM

## 2021-02-12 DIAGNOSIS — M25512 Pain in left shoulder: Secondary | ICD-10-CM | POA: Diagnosis not present

## 2021-02-12 HISTORY — DX: COVID-19: U07.1

## 2021-03-01 DIAGNOSIS — M25512 Pain in left shoulder: Secondary | ICD-10-CM | POA: Diagnosis not present

## 2021-03-03 ENCOUNTER — Ambulatory Visit: Payer: Medicare Other | Admitting: Physician Assistant

## 2021-03-09 ENCOUNTER — Other Ambulatory Visit: Payer: Self-pay | Admitting: Physician Assistant

## 2021-03-09 ENCOUNTER — Ambulatory Visit: Payer: Medicare Other | Admitting: Obstetrics & Gynecology

## 2021-03-09 DIAGNOSIS — Z0289 Encounter for other administrative examinations: Secondary | ICD-10-CM

## 2021-03-10 DIAGNOSIS — L821 Other seborrheic keratosis: Secondary | ICD-10-CM | POA: Diagnosis not present

## 2021-03-10 DIAGNOSIS — L820 Inflamed seborrheic keratosis: Secondary | ICD-10-CM | POA: Diagnosis not present

## 2021-03-10 DIAGNOSIS — Z85828 Personal history of other malignant neoplasm of skin: Secondary | ICD-10-CM | POA: Diagnosis not present

## 2021-03-10 DIAGNOSIS — D485 Neoplasm of uncertain behavior of skin: Secondary | ICD-10-CM | POA: Diagnosis not present

## 2021-03-16 ENCOUNTER — Telehealth: Payer: Self-pay | Admitting: *Deleted

## 2021-03-16 ENCOUNTER — Encounter: Payer: Self-pay | Admitting: Obstetrics & Gynecology

## 2021-03-16 ENCOUNTER — Ambulatory Visit (INDEPENDENT_AMBULATORY_CARE_PROVIDER_SITE_OTHER): Payer: Medicare Other | Admitting: Obstetrics & Gynecology

## 2021-03-16 ENCOUNTER — Other Ambulatory Visit: Payer: Self-pay

## 2021-03-16 ENCOUNTER — Other Ambulatory Visit: Payer: Self-pay | Admitting: Obstetrics & Gynecology

## 2021-03-16 VITALS — BP 110/64 | Temp 98.7°F

## 2021-03-16 DIAGNOSIS — R102 Pelvic and perineal pain: Secondary | ICD-10-CM | POA: Diagnosis not present

## 2021-03-16 DIAGNOSIS — M79621 Pain in right upper arm: Secondary | ICD-10-CM

## 2021-03-16 DIAGNOSIS — R1031 Right lower quadrant pain: Secondary | ICD-10-CM | POA: Diagnosis not present

## 2021-03-16 DIAGNOSIS — R599 Enlarged lymph nodes, unspecified: Secondary | ICD-10-CM

## 2021-03-16 LAB — URINALYSIS, COMPLETE W/RFL CULTURE
Bacteria, UA: NONE SEEN /HPF
Bilirubin Urine: NEGATIVE
Casts: NONE SEEN /LPF
Crystals: NONE SEEN /HPF
Glucose, UA: NEGATIVE
Hgb urine dipstick: NEGATIVE
Hyaline Cast: NONE SEEN /LPF
Ketones, ur: NEGATIVE
Leukocyte Esterase: NEGATIVE
Nitrites, Initial: NEGATIVE
Protein, ur: NEGATIVE
RBC / HPF: NONE SEEN /HPF (ref 0–2)
Specific Gravity, Urine: 1.015 (ref 1.001–1.035)
WBC, UA: NONE SEEN /HPF (ref 0–5)
Yeast: NONE SEEN /HPF
pH: 7 (ref 5.0–8.0)

## 2021-03-16 LAB — NO CULTURE INDICATED

## 2021-03-16 NOTE — Telephone Encounter (Signed)
Appointment scheduled 03/17/21 at 11am.

## 2021-03-16 NOTE — Progress Notes (Addendum)
    Amanda [name] July 12, 1950 [id number]        70 y.o.  G2P2002   RP: Shooting pains at the Rt axilla and towards the Rt inguinal area  HPI: Mostly having shooting pains at the Rt axilla.  No lump felt. Screening Mammo 12/31/2020 Neg.  Had a shooting pain once recently at the Rt inguinal area.  No other Sx.  No skin lesion seen.  No fever.   OB History  Gravida Para Term Preterm AB Living  2 2 2     2   SAB IAB Ectopic Multiple Live Births               # Outcome Date GA Lbr Len/2nd Weight Sex Delivery Anes PTL Lv  2 Term           1 Term             Past medical history,surgical history, problem list, medications, allergies, family history and social history were all reviewed and documented in the EPIC chart.   Directed ROS with pertinent positives and negatives documented in the history of present illness/assessment and plan.  Exam:  Vitals:   03/16/21 1137  BP: 110/64  Temp: 98.7 F (37.1 C)  TempSrc: Oral   General appearance:  Normal  Breast exam:  Bilateral normal breast exam.  No axillary LN felt bilaterally.  Tender at the ribs towards the Rt axilla.  Abdomen: Soft, NT.  No mass felt.                   Lt inguinal area normal                   Rt inguinal area: 2-3 mildly enlarged, tender LNs  Gynecologic exam: Vulva normal  U/A Neg   Assessment/Plan:  70 y.o. G2P2002   1. Right inguinal pain Mildly enlarged and tender Rt inguinal LNs.  Will further investigate with a Rt inguinal area [place]. U/A Neg - Urinalysis,Complete w/RFL Culture  2. Pain in right axilla  No nodule, mass or enlarged LN felt.  Tender when palpating the ribs close to the Rt axilla.  Probable chondritis type of pain.  Will use NSAIDs and rest.  If no improvement, further investigate with [name] MD.   [name] MD, 12:14 PM 03/16/2021

## 2021-03-16 NOTE — Telephone Encounter (Signed)
U/s order placed with [facility]. I called patient and informed her and provided phoen # so that she can call and schedule her u/s appt.

## 2021-03-16 NOTE — Telephone Encounter (Signed)
-----   Message from [name], MD sent at 03/16/2021 12:19 PM EST ----- Regarding: Rt inguinal pain/tenderness with 2-3 enlarged Lymph Nodes Rt inguinal [place].

## 2021-03-17 ENCOUNTER — Other Ambulatory Visit: Payer: Medicare Other

## 2021-03-17 ENCOUNTER — Ambulatory Visit
Admission: RE | Admit: 2021-03-17 | Discharge: 2021-03-17 | Disposition: A | Discharge status: Home / Self Care | Payer: Medicare Other | Source: Ambulatory Visit | Attending: Obstetrics & Gynecology | Admitting: Obstetrics & Gynecology

## 2021-03-17 DIAGNOSIS — R10813 Right lower quadrant abdominal tenderness: Secondary | ICD-10-CM | POA: Diagnosis not present

## 2021-03-17 DIAGNOSIS — R599 Enlarged lymph nodes, unspecified: Secondary | ICD-10-CM

## 2021-03-17 DIAGNOSIS — R1031 Right lower quadrant pain: Secondary | ICD-10-CM

## 2021-03-25 ENCOUNTER — Encounter (HOSPITAL_COMMUNITY): Payer: Self-pay

## 2021-03-25 ENCOUNTER — Emergency Department (HOSPITAL_COMMUNITY): Payer: Medicare Other

## 2021-03-25 ENCOUNTER — Emergency Department (HOSPITAL_COMMUNITY)
Admission: EM | Admit: 2021-03-25 | Discharge: 2021-03-26 | Disposition: A | Discharge status: Home / Self Care | Payer: Medicare Other | Attending: Emergency Medicine | Admitting: Emergency Medicine

## 2021-03-25 ENCOUNTER — Other Ambulatory Visit: Payer: Self-pay

## 2021-03-25 DIAGNOSIS — R4020 Unspecified coma: Secondary | ICD-10-CM | POA: Diagnosis not present

## 2021-03-25 DIAGNOSIS — Z20822 Contact with and (suspected) exposure to covid-19: Secondary | ICD-10-CM | POA: Diagnosis not present

## 2021-03-25 DIAGNOSIS — R55 Syncope and collapse: Secondary | ICD-10-CM

## 2021-03-25 DIAGNOSIS — Z8616 Personal history of COVID-19: Secondary | ICD-10-CM | POA: Insufficient documentation

## 2021-03-25 DIAGNOSIS — Z85850 Personal history of malignant neoplasm of thyroid: Secondary | ICD-10-CM | POA: Diagnosis not present

## 2021-03-25 DIAGNOSIS — Y900 Blood alcohol level of less than 20 mg/100 ml: Secondary | ICD-10-CM | POA: Diagnosis not present

## 2021-03-25 DIAGNOSIS — R4182 Altered mental status, unspecified: Secondary | ICD-10-CM | POA: Diagnosis not present

## 2021-03-25 DIAGNOSIS — E860 Dehydration: Secondary | ICD-10-CM | POA: Diagnosis not present

## 2021-03-25 DIAGNOSIS — I10 Essential (primary) hypertension: Secondary | ICD-10-CM | POA: Diagnosis not present

## 2021-03-25 DIAGNOSIS — Z28310 Unvaccinated for covid-19: Secondary | ICD-10-CM | POA: Insufficient documentation

## 2021-03-25 DIAGNOSIS — R42 Dizziness and giddiness: Secondary | ICD-10-CM | POA: Diagnosis present

## 2021-03-25 DIAGNOSIS — R231 Pallor: Secondary | ICD-10-CM | POA: Diagnosis not present

## 2021-03-25 DIAGNOSIS — R002 Palpitations: Secondary | ICD-10-CM | POA: Insufficient documentation

## 2021-03-25 LAB — CBC WITH DIFFERENTIAL/PLATELET
Abs Immature Granulocytes: 0.02 10*3/uL (ref 0.00–0.07)
Basophils Absolute: 0 10*3/uL (ref 0.0–0.1)
Basophils Relative: 1 %
Eosinophils Absolute: 0.1 10*3/uL (ref 0.0–0.5)
Eosinophils Relative: 1 %
HCT: 37.4 % (ref 36.0–46.0)
Hemoglobin: 12.5 g/dL (ref 12.0–15.0)
Immature Granulocytes: 0 %
Lymphocytes Relative: 21 %
Lymphs Abs: 1.3 10*3/uL (ref 0.7–4.0)
MCH: 30.9 pg (ref 26.0–34.0)
MCHC: 33.4 g/dL (ref 30.0–36.0)
MCV: 92.6 fL (ref 80.0–100.0)
Monocytes Absolute: 0.4 10*3/uL (ref 0.1–1.0)
Monocytes Relative: 6 %
Neutro Abs: 4.4 10*3/uL (ref 1.7–7.7)
Neutrophils Relative %: 71 %
Platelets: 219 10*3/uL (ref 150–400)
RBC: 4.04 MIL/uL (ref 3.87–5.11)
RDW: 13.1 % (ref 11.5–15.5)
WBC: 6.2 10*3/uL (ref 4.0–10.5)
nRBC: 0 % (ref 0.0–0.2)

## 2021-03-25 LAB — BRAIN NATRIURETIC PEPTIDE: B Natriuretic Peptide: 55.3 pg/mL (ref 0.0–100.0)

## 2021-03-25 LAB — RESP PANEL BY RT-PCR (FLU A&B, COVID) ARPGX2
Influenza A by PCR: NEGATIVE
Influenza B by PCR: NEGATIVE
SARS Coronavirus 2 by RT PCR: NEGATIVE

## 2021-03-25 LAB — CBG MONITORING, ED: Glucose-Capillary: 81 mg/dL (ref 70–99)

## 2021-03-25 LAB — COMPREHENSIVE METABOLIC PANEL
ALT: 23 U/L (ref 0–44)
AST: 28 U/L (ref 15–41)
Albumin: 4.3 g/dL (ref 3.5–5.0)
Alkaline Phosphatase: 63 U/L (ref 38–126)
Anion gap: 10 (ref 5–15)
BUN: 12 mg/dL (ref 8–23)
CO2: 24 mmol/L (ref 22–32)
Calcium: 9.2 mg/dL (ref 8.9–10.3)
Chloride: 102 mmol/L (ref 98–111)
Creatinine, Ser: 0.77 mg/dL (ref 0.44–1.00)
GFR, Estimated: >60 mL/min (ref >60)
Glucose, Bld: 100 mg/dL — ABNORMAL HIGH (ref 70–99)
Potassium: 3.4 mmol/L — ABNORMAL LOW (ref 3.5–5.1)
Sodium: 136 mmol/L (ref 135–145)
Total Bilirubin: 0.6 mg/dL (ref 0.3–1.2)
Total Protein: 7.5 g/dL (ref 6.5–8.1)

## 2021-03-25 LAB — MAGNESIUM: Magnesium: 2.2 mg/dL (ref 1.7–2.4)

## 2021-03-25 LAB — TROPONIN I (HIGH SENSITIVITY): Troponin I (High Sensitivity): 4 ng/L (ref <18)

## 2021-03-25 LAB — TSH: TSH: 2.221 u[IU]/mL (ref 0.350–4.500)

## 2021-03-25 LAB — ETHANOL: Alcohol, Ethyl (B): <10 mg/dL (ref <10)

## 2021-03-25 LAB — LIPASE, BLOOD: Lipase: 34 U/L (ref 11–51)

## 2021-03-25 MED ORDER — SODIUM CHLORIDE 0.9 % IV BOLUS
1000.0000 mL | Freq: Once | INTRAVENOUS | Status: AC
  Administered 2021-03-25: 22:00:00 1000 mL via INTRAVENOUS

## 2021-03-25 NOTE — ED Notes (Signed)
Pt tolerated orthostatic vitals well. Pt was assisted back into bed. Bed locked in a low safe position call light within reach.

## 2021-03-25 NOTE — ED Provider Notes (Signed)
Emergency Department Provider Note   I have reviewed the triage vital signs and the nursing notes.   HISTORY  Chief Complaint Near Syncope   HPI SYDNY SCHNITZLER is a 70 y.o. female with past history reviewed below presents to the emergency department with acute onset lightheadedness with heart palpitations.  Patient was out to eat and had 2 sips of vodka/tonic.  She states she began to feel woozy and lightheaded like she may pass out.  She notes that this is happened to her in the past with dehydration but she felt severe heart palpitations shortly after, which is new for her.  She states that she did not have pain but felt like her heart was beating out of her chest.  She denies any back or abdominal pain.  Denies headache.  No unilateral weakness.  According to EMS, friends report that she was less responsive for 5 to 10 minutes and has gradually improved since being transported. No fall or head injury.    Past Medical History:  Diagnosis Date   Anxiety    Celiac disease    Chronic constipation    COVID 02/12/2021   COVID-19 virus vaccine not available    GERD (gastroesophageal reflux disease)    occasionally (per pt takes apple cider vinager)   Heart palpitations    cardiologist-- dr Ellyn Hack--- event monitor 08-24-2011 epic ;  nuclear stress test 03-16-2011 (epic) normal w/ no ischemia, ef 65%;  echo 01-18-2013  ef 60-65%, G1DD   Hiatal hernia    History of subdural hematoma    12/ 23/ 2010  s/p  craniotomy w/ hematoma evacuation (pt had a fall w/ concussion)  per pt no residual   History of thyroid cancer followed by dr Constance Holster---   s/p  left thyroidectomy --- per pt no radiation and no recurrence   Hyperlipidemia    Immunization, single disease 06/09/2019   1st dose moderma vaccine administered   Insomnia    OA (osteoarthritis)    Osteopenia, T score -2.1 FRAX 9.4%/1.3% stable from prior DEXA 06/2015   Restless leg syndrome    Right thyroid nodule    followed by dr  Constance Holster--- last ultrasound in epic 03-30-2018 stable , to bx   STD (sexually transmitted disease), HSV    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   Wears glasses     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   History of subdural hematoma 03/26/2019   Herpes simplex type 1 infection 11/01/2018   History of Guillain-Barre syndrome 04/01/2018   Hyperlipidemia with target LDL less than 100 03/30/2018   Mastalgia, left, followed by GYN 03/29/2018   Fibromyalgia 03/29/2018   Fuchs' corneal dystrophy, 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   Insulin resistance 03/29/2018   History of hysterectomy 03/29/2018   Grade I diastolic dysfunction 54/65/6812   Thyroid nodule 03/27/2018   Presbycusis of both ears 03/10/2017   Dysthymia 09/26/2016   Palpitations 01/17/2013   GERD (gastroesophageal reflux disease)    Restless leg syndrome, on Requip    Allergic rhinitis 06/25/2009   Diaphragmatic hernia 02/16/2009   IBS (irritable bowel syndrome) 02/16/2009   Celiac disease 11/27/2007   Insomnia 07/05/2007   Osteoarthritis 07/05/2007   Osteopenia 10/03/2006  Left carotid bruit 10/03/2006   History of thyroid cancer 10/03/2006    Past Surgical History:  Procedure Laterality Date   ABDOMINAL HYSTERECTOMY  1986   ovaries remain   BREAST REDUCTION SURGERY Bilateral 1999   CATARACT EXTRACTION W/ INTRAOCULAR LENS  IMPLANT, BILATERAL  2019   COLONOSCOPY  last one 11-22-2018   ESOPHAGOGASTRODUODENOSCOPY     KNEE ARTHROSCOPY Bilateral 2002   SUBDURAL HEMATOMA EVACUATION VIA CRANIOTOMY  04-30-2009  @[facility]    left frontotemporapartietal    THYROID LOBECTOMY Left 05-04-2001  @[facility]   dr Constance Holster   VULVECTOMY N/A 03/12/2019   Procedure: WIDE  LOCAL EXCISION OF CARCINOMA IS SITU OF VULVA;  Surgeon: Anastasio Auerbach, MD;  Location: [facility];  Service: Gynecology;  Laterality: N/A;  request to follow in [place] block time requests one hour colposcope available in OR with acetic acid.   VULVECTOMY N/A 06/17/2019   Procedure: WIDE LOCAL  EXCISION VULVECTOMY;  Surgeon: Everitt Amber, MD;  Location: [facility];  Service: Gynecology;  Laterality: N/A;    Allergies Ambien [zolpidem tartrate], Cortisone, Eszopiclone and related, Levofloxacin, Dicloxacillin, Dilantin [phenytoin sodium extended], Erythromycin, Avelox [moxifloxacin hcl in nacl], Influenza vaccines, Other, Nitrofuran derivatives, Phenytoin sodium extended, and Trazodone and nefazodone  Family History  Problem Relation Age of Onset   Diabetes Maternal Aunt    Breast cancer Maternal Aunt 60   Liver cancer Father        alcohol related likely   Failure to thrive Mother        57   Heart disease Maternal Grandfather    Hyperlipidemia Maternal Grandfather    Hypertension Paternal Grandfather    Colon cancer Neg Hx    Thyroid disease Neg Hx     Social History Social History   Tobacco Use   Smoking status: Never   Smokeless tobacco: Never  Vaping Use   Vaping Use: Never used  Substance Use Topics   Alcohol use: Yes    Comment: 2-3 times weekly   Drug use: Never    Review of Systems  Constitutional: No fever/chills Eyes: No visual changes. ENT: No sore throat. Cardiovascular: Denies chest pain. Positive heart palpitations and near syncope.  Respiratory: Denies shortness of breath. Gastrointestinal: No abdominal pain.  No nausea, no vomiting.  No diarrhea.  No constipation. Genitourinary: Negative for dysuria. Musculoskeletal: Negative for back pain. Skin: Negative for rash. Neurological: Negative for headaches, focal weakness or numbness.  10-point ROS otherwise  negative.  ____________________________________________   PHYSICAL EXAM:  VITAL SIGNS: ED Triage Vitals  Enc Vitals Group     BP 03/25/21 2048 (!) 185/89     Pulse Rate 03/25/21 2048 68     Resp 03/25/21 2048 18     Temp 03/25/21 2048 98.1 F (36.7 C)     Temp Source 03/25/21 2048 Oral     SpO2 03/25/21 2048 97 %     Weight 03/25/21 2050 146 lb (66.2 kg)     Height 03/25/21 2050 5' 6"  (1.676 m)    Constitutional: Alert and oriented. Well appearing and in no acute distress. Eyes: Conjunctivae are normal. PERRL. Head: Atraumatic. Nose: No congestion/rhinnorhea. Mouth/Throat: Mucous membranes are moist.   Neck: No stridor.   Cardiovascular: Normal rate, regular rhythm. Good peripheral circulation. Grossly normal heart sounds.   Respiratory: Normal respiratory effort.  No retractions. Lungs CTAB. Gastrointestinal: Soft and nontender. No distention.  Musculoskeletal: No lower extremity tenderness nor edema. No gross deformities of extremities. Neurologic:  Normal  speech and language. No gross focal neurologic deficits are appreciated.  Skin:  Skin is warm, dry and intact. No rash noted.  ____________________________________________   LABS (all labs ordered are listed, but only abnormal results are displayed)  Labs Reviewed  COMPREHENSIVE METABOLIC PANEL - Abnormal; Notable for the following components:      Result Value   Potassium 3.4 (*)    Glucose, Bld 100 (*)    All other components within normal limits  RESP PANEL BY RT-PCR (FLU A&B, COVID) ARPGX2  ETHANOL  LIPASE, BLOOD  CBC WITH DIFFERENTIAL/PLATELET  MAGNESIUM  TSH  BRAIN NATRIURETIC PEPTIDE  CBG MONITORING, ED  TROPONIN I (HIGH SENSITIVITY)  TROPONIN I (HIGH SENSITIVITY)   ____________________________________________  EKG   EKG Interpretation  Date/Time:  Thursday March 25 2021 20:47:18 EST Ventricular Rate:  67 PR Interval:  151 QRS Duration: 107 QT Interval:  414 QTC Calculation: 437 R  Axis:   15 Text Interpretation: Sinus rhythm Confirmed by Nanda Quinton [phone]) on 03/25/2021 9:29:07 PM        ____________________________________________  RADIOLOGY  CT head and CXR reviewed.   ____________________________________________   PROCEDURES  Procedure(s) performed:   Procedures  None ____________________________________________   INITIAL IMPRESSION / ASSESSMENT AND PLAN / ED COURSE  Pertinent labs & imaging results that were available during my care of the patient were reviewed by me and considered in my medical decision making (see chart for details).   Patient presents to the emergency department for evaluation of near syncope with heart palpitations.  She has had lightheadedness in the past blamed on dehydration, according to patient.  She states this felt different because she felt very rapid heartbeat at the time of symptoms.  She is neuro intact here and asymptomatic currently.  EKG shows no acute arrhythmia or ischemic change.  With acute mental status change plan for CT imaging of the head along with chest x-ray, labs, IVF and reassess.  CT imaging, CXR, and labs reviewed. Noa cute findings. Plan for repeat troponin and d/c if negative. Patient will call her cardiologist tomorrow for a follow up appointment.   Care transferred to Dr. Wyvonnia Dusky.  ____________________________________________  FINAL CLINICAL IMPRESSION(S) / ED DIAGNOSES  Final diagnoses:  Near syncope     MEDICATIONS GIVEN DURING THIS VISIT:  Medications  sodium chloride 0.9 % bolus 1,000 mL (0 mLs Intravenous Stopped 03/26/21 0005)    Note:  This document was prepared using Dragon voice recognition software and may include unintentional dictation errors.  Nanda Quinton, MD, [facility] Emergency [facility]    Vikram Tillett, [facility], MD 03/27/21 1044

## 2021-03-25 NOTE — ED Triage Notes (Signed)
Pt BIBA from outing with friends. Per medic, pt at dinner when friends noticed patient becoming flushed in face, non-verbal and began to slouch. Per medic, patient episode lasted about 5 to 10 minutes. Per medic, no reports of LOC, fall or head trauma. Pt A&Ox4 at this time. PTA 18G IV access established.  Vitals: BP: 170/116; HR 70; CBG: 110; SpO2: 100% (RA).

## 2021-03-25 NOTE — ED Notes (Signed)
Patient transported to CT 

## 2021-03-26 DIAGNOSIS — R55 Syncope and collapse: Secondary | ICD-10-CM | POA: Diagnosis not present

## 2021-03-26 LAB — TROPONIN I (HIGH SENSITIVITY): Troponin I (High Sensitivity): 7 ng/L (ref <18)

## 2021-03-26 NOTE — ED Provider Notes (Addendum)
Second troponin is negative.  Patient feels well and wants to go home.  She is tolerating p.o. and ambulatory.  Denies any chest pain, shortness of breath, abdominal pain, dizziness.  Observation admission offered which she declines.  Appears to have capacity to make this decision.  Orthostatics are negative.  Patient states she will follow-up with your primary doctor for Holter monitoring as well as her cardiologist. She never did have chest pain or shortness of breath. She is adamant that she wants to go home and has capacity to make this decision. Return to the ED if she wishes to be reevaluated, or if she develops exertional chest pain or chest pain associate with shortness of breath, nausea, vomiting, sweating or any other concerns   Ezequiel Essex, MD 03/26/21 [facility]    Ezequiel Essex, MD 03/26/21 0140

## 2021-03-26 NOTE — Discharge Instructions (Signed)
Keep yourself hydrated.  You declined admission to the hospital today.  Follow-up with your doctor to discuss wearing a heart monitor at home.  Return to the ED with exertional chest pain, shortness of breath, vomiting, fever, any other concerns.

## 2021-04-07 ENCOUNTER — Other Ambulatory Visit: Payer: Self-pay | Admitting: Family Medicine

## 2021-04-07 ENCOUNTER — Other Ambulatory Visit: Payer: Self-pay | Admitting: Physician Assistant

## 2021-04-07 NOTE — Telephone Encounter (Signed)
Pt requesting refill for Clonazepam 0.5 mg. Last OV 01/2021 with Dr. Jonni Sanger.

## 2021-04-15 ENCOUNTER — Telehealth: Payer: Self-pay | Admitting: Gastroenterology

## 2021-04-15 NOTE — Telephone Encounter (Signed)
Hi Dr. Havery Moros,   Patient called yesterday requesting a transfer of care back to you from [facility]. All records are available via Epic for you to review and advise on approval for scheduling.   Thank you

## 2021-04-15 NOTE — Telephone Encounter (Signed)
That's fine, I'm happy to see her. Thanks

## 2021-04-17 ENCOUNTER — Other Ambulatory Visit: Payer: Self-pay

## 2021-04-17 ENCOUNTER — Emergency Department (HOSPITAL_BASED_OUTPATIENT_CLINIC_OR_DEPARTMENT_OTHER): Payer: Medicare Other

## 2021-04-17 ENCOUNTER — Encounter (HOSPITAL_BASED_OUTPATIENT_CLINIC_OR_DEPARTMENT_OTHER): Payer: Self-pay | Admitting: Emergency Medicine

## 2021-04-17 ENCOUNTER — Emergency Department (HOSPITAL_BASED_OUTPATIENT_CLINIC_OR_DEPARTMENT_OTHER)
Admission: EM | Admit: 2021-04-17 | Discharge: 2021-04-17 | Disposition: A | Discharge status: Home / Self Care | Payer: Medicare Other | Attending: Emergency Medicine | Admitting: Emergency Medicine

## 2021-04-17 DIAGNOSIS — Z8616 Personal history of COVID-19: Secondary | ICD-10-CM | POA: Diagnosis not present

## 2021-04-17 DIAGNOSIS — Z79899 Other long term (current) drug therapy: Secondary | ICD-10-CM | POA: Diagnosis not present

## 2021-04-17 DIAGNOSIS — R1030 Lower abdominal pain, unspecified: Secondary | ICD-10-CM | POA: Diagnosis not present

## 2021-04-17 DIAGNOSIS — R9431 Abnormal electrocardiogram [ECG] [EKG]: Secondary | ICD-10-CM | POA: Diagnosis not present

## 2021-04-17 DIAGNOSIS — R1031 Right lower quadrant pain: Secondary | ICD-10-CM | POA: Diagnosis not present

## 2021-04-17 LAB — COMPREHENSIVE METABOLIC PANEL
ALT: 21 U/L (ref 0–44)
AST: 28 U/L (ref 15–41)
Albumin: 4.9 g/dL (ref 3.5–5.0)
Alkaline Phosphatase: 63 U/L (ref 38–126)
Anion gap: 9 (ref 5–15)
BUN: 14 mg/dL (ref 8–23)
CO2: 27 mmol/L (ref 22–32)
Calcium: 10.1 mg/dL (ref 8.9–10.3)
Chloride: 104 mmol/L (ref 98–111)
Creatinine, Ser: 0.77 mg/dL (ref 0.44–1.00)
GFR, Estimated: >60 mL/min (ref >60)
Glucose, Bld: 94 mg/dL (ref 70–99)
Potassium: 3.9 mmol/L (ref 3.5–5.1)
Sodium: 140 mmol/L (ref 135–145)
Total Bilirubin: 0.7 mg/dL (ref 0.3–1.2)
Total Protein: 7.8 g/dL (ref 6.5–8.1)

## 2021-04-17 LAB — CBC
HCT: 40.2 % (ref 36.0–46.0)
Hemoglobin: 13.1 g/dL (ref 12.0–15.0)
MCH: 30.3 pg (ref 26.0–34.0)
MCHC: 32.6 g/dL (ref 30.0–36.0)
MCV: 93.1 fL (ref 80.0–100.0)
Platelets: 239 10*3/uL (ref 150–400)
RBC: 4.32 MIL/uL (ref 3.87–5.11)
RDW: 13.1 % (ref 11.5–15.5)
WBC: 5.5 10*3/uL (ref 4.0–10.5)
nRBC: 0 % (ref 0.0–0.2)

## 2021-04-17 LAB — URINALYSIS, ROUTINE W REFLEX MICROSCOPIC
Bilirubin Urine: NEGATIVE
Glucose, UA: NEGATIVE mg/dL
Hgb urine dipstick: NEGATIVE
Ketones, ur: NEGATIVE mg/dL
Leukocytes,Ua: NEGATIVE
Nitrite: NEGATIVE
Protein, ur: NEGATIVE mg/dL
Specific Gravity, Urine: 1.014 (ref 1.005–1.030)
pH: 7.5 (ref 5.0–8.0)

## 2021-04-17 LAB — LIPASE, BLOOD: Lipase: 14 U/L (ref 11–51)

## 2021-04-17 MED ORDER — IOHEXOL 300 MG/ML  SOLN
100.0000 mL | Freq: Once | INTRAMUSCULAR | Status: AC | PRN
  Administered 2021-04-17: 100 mL via INTRAVENOUS

## 2021-04-17 NOTE — ED Triage Notes (Signed)
Pt arrives pov with c/o intermittent RLQ pain x 3 days, decreased appetite and fatigue and bloating

## 2021-04-17 NOTE — Discharge Instructions (Signed)
Follow up with your primary care for further outpatient evaluation if symptoms persist.   Return to the ED with any new or concerning symptoms at any time.

## 2021-04-17 NOTE — ED Provider Notes (Signed)
[facility] EMERGENCY DEPT Provider Note   CSN: [id number] Arrival date & time: 04/17/21  1200     History Chief Complaint  Patient presents with   Abdominal Pain    Amanda [name] is a 70 y.o. female.  Patient to ED for evaluation of sharp, shooting RLQ abdominal pain that started yesterday and has been intermittent since onset. No vomiting, diarrhea or constipation. No urinary symptoms. She reports concern for weight loss over the last 1 month of about 8-10 pounds, history of hiatal hernia and reflux causing early satiety, history of IBS. She has talked to her OB/GYN about her pain and had an abdominal US performed that was negative. No fever, cough.   The history is provided by the patient. No language interpreter was used.  Abdominal Pain Associated symptoms: no constipation, no diarrhea, no dysuria, no fever, no vaginal discharge and no vomiting       Past Medical History:  Diagnosis Date   Anxiety    Celiac disease    Chronic constipation    COVID 02/12/2021   COVID-19 virus vaccine not available    GERD (gastroesophageal reflux disease)    occasionally (per pt takes apple cider vinager)   Heart palpitations    cardiologist-- dr Ellyn Hack--- event monitor 08-24-2011 epic ;  nuclear stress test 03-16-2011 (epic) normal w/ no ischemia, ef 65%;  echo 01-18-2013  ef 60-65%, G1DD   Hiatal hernia    History of subdural hematoma    12/ 23/ 2010  s/p  craniotomy w/ hematoma evacuation (pt had a fall w/ concussion)  per pt no residual   History of thyroid cancer followed by dr Constance Holster---   s/p  left thyroidectomy --- per pt no radiation and no recurrence   Hyperlipidemia    Immunization, single disease 06/09/2019   1st dose moderma vaccine administered   Insomnia    OA (osteoarthritis)    Osteopenia, T score -2.1 FRAX 9.4%/1.3% stable from prior DEXA 06/2015   Restless leg syndrome    Right thyroid nodule    followed by dr Constance Holster--- last ultrasound in epic  03-30-2018 stable , to bx   STD (sexually transmitted disease), HSV    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   Wears glasses     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   History of subdural hematoma 03/26/2019   Herpes simplex type 1 infection 11/01/2018   History of Guillain-Barre syndrome 04/01/2018   Hyperlipidemia with target LDL less than 100 03/30/2018   Mastalgia, left, followed by GYN 03/29/2018   Fibromyalgia 03/29/2018   Fuchs' corneal dystrophy, 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   Insulin resistance 03/29/2018   History of hysterectomy 03/29/2018   Grade I diastolic dysfunction 18/84/1660   Thyroid nodule 03/27/2018   Presbycusis of both ears 03/10/2017   Dysthymia 09/26/2016   Palpitations 01/17/2013   GERD (gastroesophageal reflux disease)    Restless leg syndrome, on Requip    Allergic rhinitis 06/25/2009   Diaphragmatic hernia 02/16/2009   IBS (irritable bowel syndrome) 02/16/2009   Celiac disease 11/27/2007   Insomnia 07/05/2007   Osteoarthritis 07/05/2007   Osteopenia 10/03/2006   Left carotid bruit 10/03/2006   History of thyroid cancer 10/03/2006    Past  Surgical History:  Procedure Laterality Date   ABDOMINAL HYSTERECTOMY  1986   ovaries remain   BREAST REDUCTION SURGERY Bilateral 1999   CATARACT EXTRACTION W/ INTRAOCULAR LENS  IMPLANT, BILATERAL  2019   COLONOSCOPY  last one 11-22-2018   ESOPHAGOGASTRODUODENOSCOPY     KNEE ARTHROSCOPY Bilateral 2002   SUBDURAL HEMATOMA EVACUATION VIA CRANIOTOMY  04-30-2009  @[facility]    left frontotemporapartietal    THYROID LOBECTOMY Left 05-04-2001  @[facility]   dr Constance Holster   VULVECTOMY N/A 03/12/2019   Procedure: WIDE LOCAL EXCISION OF CARCINOMA IS SITU  OF VULVA;  Surgeon: Anastasio Auerbach, MD;  Location: [facility];  Service: Gynecology;  Laterality: N/A;  request to follow in [place] block time requests one hour colposcope available in OR with acetic acid.   VULVECTOMY N/A 06/17/2019   Procedure: WIDE LOCAL  EXCISION VULVECTOMY;  Surgeon: Everitt Amber, MD;  Location: [facility];  Service: Gynecology;  Laterality: N/A;     OB History     Gravida  2   Para  2   Term  2   Preterm      AB      Living  2      SAB      IAB      Ectopic      Multiple      Live Births              Family History  Problem Relation Age of Onset   Diabetes Maternal Aunt    Breast cancer Maternal Aunt 60   Liver cancer Father        alcohol related likely   Failure to thrive Mother        109   Heart disease Maternal Grandfather    Hyperlipidemia Maternal Grandfather    Hypertension Paternal Grandfather    Colon cancer Neg Hx    Thyroid disease Neg Hx     Social History   Tobacco Use   Smoking status: Never   Smokeless tobacco: Never  Vaping Use   Vaping Use: Never used  Substance Use Topics   Alcohol use: Yes    Comment: 2-3 times weekly   Drug use: Never    Home Medications Prior to Admission medications   Medication Sig Start Date End Date Taking? Authorizing Provider  bisacodyl (DULCOLAX) 5 MG EC tablet Take 5 mg by mouth daily as needed for moderate constipation.    [provider]  CALCIUM PO Take by mouth. Takes 2    [provider]  cholecalciferol (VITAMIN D3) 25 MCG (1000 UT) tablet Take 1,000 Units by mouth daily.    [provider]  clonazePAM (KLONOPIN) 0.5 MG tablet Take 1 tablet (0.5 mg total) by mouth at bedtime. 04/07/21   Marin Olp, MD  CO ENZYME Q-10 PO Take by mouth daily.     [provider]  Cyanocobalamin (VITAMIN B-12 ER PO) Take 5,000 mcg by mouth daily.    [provider]  escitalopram (LEXAPRO) 10 MG  tablet Take 10 mg by mouth daily. 08/25/19   [provider]  Melatonin 3 MG CAPS Take by mouth at bedtime as needed.     [provider]  metoprolol succinate (TOPROL-XL) 25 MG 24 hr tablet Take 1 tablet (25 mg total) by mouth daily. Take with or immediately following a meal. 01/14/21   Almyra Deforest, PA  Multiple Vitamin (MULTIVITAMIN) tablet Take 1 tablet by mouth daily.  [provider]  NON FORMULARY Bausch and lomb ointment-Muro-//128, 5%ointment- Each eye nightly    [provider]  rOPINIRole (REQUIP) 0.25 MG tablet TAKE 1 TABLET BY MOUTH AT BEDTIME 05/04/20   Marin Olp, MD  rosuvastatin (CRESTOR) 5 MG tablet TAKE 1 TABLET BY MOUTH EVERY DAY 10/19/20   Marin Olp, MD  valACYclovir (VALTREX) 500 MG tablet One tab bid as needed for outbreak 03/26/19   Marin Olp, MD    Allergies    Ambien [zolpidem tartrate], Cortisone, Eszopiclone and related, Levofloxacin, Dicloxacillin, Dilantin [phenytoin sodium extended], Erythromycin, Avelox [moxifloxacin hcl in nacl], Influenza vaccines, Other, Nitrofuran derivatives, Phenytoin sodium extended, and Trazodone and nefazodone  Review of Systems   Review of Systems  Constitutional:  Positive for unexpected weight change. Negative for fever.  HENT: Negative.    Respiratory: Negative.    Gastrointestinal:  Positive for abdominal pain. Negative for constipation, diarrhea and vomiting.  Genitourinary:  Negative for dysuria and vaginal discharge.  Neurological:  Negative for weakness.   Physical Exam Updated Vital Signs BP (!) 143/74 (BP Location: Left Leg)   Pulse 86   Temp 98.7 F (37.1 C)   Resp 18   Ht 5' 6"  (1.676 m)   Wt 66.2 kg   LMP  (LMP Unknown)   SpO2 97%   BMI 23.57 kg/m   Physical Exam Vitals and nursing note reviewed.  Constitutional:      Appearance: She is well-developed.  HENT:     Head: Normocephalic.  Cardiovascular:     Rate and Rhythm: Normal rate and regular  rhythm.     Heart sounds: No murmur heard. Pulmonary:     Effort: Pulmonary effort is normal.     Breath sounds: Normal breath sounds. No wheezing, rhonchi or rales.  Abdominal:     General: Bowel sounds are normal. There is no distension.     Palpations: Abdomen is soft.     Tenderness: There is abdominal tenderness in the right lower quadrant. There is no guarding or rebound.  Musculoskeletal:        General: Normal range of motion.     Cervical back: Normal range of motion and neck supple.  Skin:    General: Skin is warm and dry.  Neurological:     General: No focal deficit present.     Mental Status: She is alert and oriented to person, place, and time.    ED Results / Procedures / Treatments   Labs (all labs ordered are listed, but only abnormal results are displayed) Labs Reviewed  LIPASE, BLOOD  COMPREHENSIVE METABOLIC PANEL  CBC  URINALYSIS, ROUTINE W REFLEX MICROSCOPIC    EKG EKG Interpretation  Date/Time:  Saturday April 17 2021 13:28:52 EST Ventricular Rate:  62 PR Interval:  148 QRS Duration: 103 QT Interval:  387 QTC Calculation: 393 R Axis:   18 Text Interpretation: Sinus rhythm nonspecific changes are similar to Nov 2022 Confirmed by Sherwood Gambler [phone]) on 04/17/2021 1:32:00 PM  Radiology No results found.  Procedures Procedures   Medications Ordered in ED Medications - No data to display  ED Course  I have reviewed the triage vital signs and the nursing notes.  Pertinent labs & imaging results that were available during my care of the patient were reviewed by me and considered in my medical decision making (see chart for details).    MDM Rules/Calculators/A&P  Patient to ED with c/o lower abdominal pain, sharp, shooting as detailed in the HPI.   Well appearing patient in NAD. VSS, afebrile. Chart reviewed. She has been seen earlier in November for GYn for similarly described symptoms.   No fever, no  leukocytosis to suggest acute process. CT scan negative. She is felt appropriate for discharge home with PCP follow up.   Final Clinical Impression(s) / ED Diagnoses Final diagnoses:  None   Lower abdominal pain, recurrent  Rx / DC Orders ED Discharge Orders     None        Charlann Lange, PA-C 04/17/21 1510    Sherwood Gambler, MD 04/18/21 [phone]

## 2021-04-18 ENCOUNTER — Telehealth: Payer: Self-pay | Admitting: Obstetrics and Gynecology

## 2021-04-18 DIAGNOSIS — R1031 Right lower quadrant pain: Secondary | ICD-10-CM

## 2021-04-18 NOTE — Telephone Encounter (Signed)
Phone call from patient on 04/17/21 reporting sharp right lower quadrant abdominal pain accompanied by weight loss, decreased appetite and fatigue.  Symptoms progressive and increased by movement and activity. Stated she has a history of a hernia and still has appendix.  I recommended ER evaluation and patient had a CT scan of the abdomen and pelvis which was unremarkable.  Appendix was not definitely seen, but no inflammatory changes concerning for occult appendicitis.  No adnexal masses seen.   Patient was instructed by ER to follow up with her PCP.

## 2021-04-19 NOTE — Telephone Encounter (Signed)
Called patient to advise her of recommendations below left voicemail.

## 2021-04-19 NOTE — Telephone Encounter (Signed)
Dr.Lavoie patient called back and said she did not follow up with PCP because she is not sure this is not a GYN issue. Patient has Hysterectomy, but still has ovaries. She states the pain is in the same area as noted on 03/16/21 visit "Right inguinal pain" had ultrasound and it was normal. Patient reports she does not know what to do? She asked if pelvic ultrasound should be done?

## 2021-04-20 NOTE — Telephone Encounter (Signed)
Message sent to appointments to call and schedule.

## 2021-04-20 NOTE — Telephone Encounter (Signed)
Per Dr.Lavoie "sharp right lower quadrant abdominal "

## 2021-04-20 NOTE — Addendum Note (Signed)
Addended by: Thamas Jaegers on: 04/20/2021 12:54 PM   Modules accepted: Orders

## 2021-04-22 ENCOUNTER — Ambulatory Visit: Payer: Medicare Other | Admitting: Gastroenterology

## 2021-04-22 NOTE — Telephone Encounter (Signed)
Ultrasound scheduled on 04/27/21

## 2021-04-27 ENCOUNTER — Other Ambulatory Visit: Payer: Self-pay

## 2021-04-27 ENCOUNTER — Ambulatory Visit (INDEPENDENT_AMBULATORY_CARE_PROVIDER_SITE_OTHER): Payer: Medicare Other

## 2021-04-27 ENCOUNTER — Ambulatory Visit (INDEPENDENT_AMBULATORY_CARE_PROVIDER_SITE_OTHER): Payer: Medicare Other | Admitting: Obstetrics & Gynecology

## 2021-04-27 VITALS — BP 142/92 | HR 64

## 2021-04-27 DIAGNOSIS — R1031 Right lower quadrant pain: Secondary | ICD-10-CM

## 2021-04-27 DIAGNOSIS — R0789 Other chest pain: Secondary | ICD-10-CM | POA: Diagnosis not present

## 2021-04-27 NOTE — Progress Notes (Signed)
° ° °  Amanda [name] August 16, 1950 [id number]        70 y.o.  G2P2L2   RP: Rt acute pelvic pain for Pelvic US  HPI: Rt pelvic pain resolved.  ER visit 04/17/21, appendicitis ruled out.  C/O Rt upper mid-clavicular line tenderness x a few days.  No lump felt.  No fam h/o Breast Ca.  Mammo Neg 12/2020.   OB History  Gravida Para Term Preterm AB Living  2 2 2     2   SAB IAB Ectopic Multiple Live Births               # Outcome Date GA Lbr Len/2nd Weight Sex Delivery Anes PTL Lv  2 Term           1 Term             Past medical history,surgical history, problem list, medications, allergies, family history and social history were all reviewed and documented in the EPIC chart.   Directed ROS with pertinent positives and negatives documented in the history of present illness/assessment and plan.  Exam:  Vitals:   04/27/21 1105  BP: (!) 142/92  Pulse: 64  SpO2: 98%   General appearance:  Normal  Breast exam:  Lt breast and axilla normal                        Rt breast and axilla normal.  Tenderness at inter-rib space at the Rt upper chest, mid-clavicular line.  Pelvic US today: T/V images.  Status post total hysterectomy.  Vaginal cuff normal.  Right ovary is atrophic in appearance with a single remnant dominant follicle noted measured at 1.77 x 1.53 cm.  Positive perfusion to the right ovary.  The left ovary is not visualized abdominally or vaginally.  No adnexal mass.  No free fluid in the pelvis.  Exam is limited due to overlying bowel and bowel gas.   Assessment/Plan:  70 y.o. G2P2002   1. Right lower quadrant pain Rt pelvic pain resolved.  ER visit 04/17/21, appendicitis ruled out.  Pelvic US results thoroughly reviewed with patient.  Rt ovary ov benign appearance, no adnexal pathology.  Probably GI origin of now resolved pain.  Reassured.  2. Right-sided chest wall pain  C/O Rt upper mid-clavicular line tenderness x a few days.  No lump felt.  No fam h/o Breast Ca.  Mammo  Neg 12/2020.  Breast exam bilaterally negative, no axillary LN felt.  Tender in between the ribs, probably musculoskeletic pain. Ibuprofen regularly x 2 days.  [name] MD, 11:11 AM 04/27/2021

## 2021-04-29 ENCOUNTER — Other Ambulatory Visit: Payer: Self-pay | Admitting: Family Medicine

## 2021-04-29 DIAGNOSIS — G2581 Restless legs syndrome: Secondary | ICD-10-CM

## 2021-05-03 ENCOUNTER — Encounter: Payer: Self-pay | Admitting: Obstetrics & Gynecology

## 2021-05-18 ENCOUNTER — Encounter: Payer: Self-pay | Admitting: Gastroenterology

## 2021-05-18 ENCOUNTER — Ambulatory Visit (INDEPENDENT_AMBULATORY_CARE_PROVIDER_SITE_OTHER): Payer: Medicare Other | Admitting: Gastroenterology

## 2021-05-18 VITALS — BP 130/80 | HR 76 | Ht 65.25 in | Wt 153.1 lb

## 2021-05-18 DIAGNOSIS — R1310 Dysphagia, unspecified: Secondary | ICD-10-CM

## 2021-05-18 DIAGNOSIS — K5909 Other constipation: Secondary | ICD-10-CM

## 2021-05-18 MED ORDER — LINACLOTIDE 72 MCG PO CAPS: 72.0000 ug | ORAL_CAPSULE | Freq: Every day | ORAL | 0 refills | Status: DC

## 2021-05-18 NOTE — Patient Instructions (Addendum)
We have given you samples of the following medication to take: Linzess 72 mcg daily before breakfast.  Janett Billow recommends that you complete a bowel purge (to clean out your bowels). Please do the following: Purchase a bottle of Miralax over the counter as well as a box of 5 mg dulcolax tablets. Take 4 dulcolax tablets. Wait 1 hour. You will then drink 6-8 capfuls of Miralax mixed in an adequate amount of water/juice/gatorade (you may choose which of these liquids to drink) over the next 2-3 hours. You should expect results within 1 to 6 hours after completing the bowel purge.  You have been scheduled for an Upper GI Series at [facility]. Your appointment is on Friday 05/21/21 at 9:30 am. Please arrive 15 minutes prior to your test for registration. Make sure not to eat or drink anything after midnight on the night before your test. If you need to reschedule, please call radiology at [phone]. ________________________________________________________________ An upper GI series uses x rays to help diagnose problems of the upper GI tract, which includes the esophagus, stomach, and duodenum. The duodenum is the first part of the small intestine. An upper GI series is conducted by a radiology technologist or a radiologist--a doctor who specializes in x-ray imaging--at a hospital or outpatient center. While sitting or standing in front of an x-ray machine, the patient drinks barium liquid, which is often white and has a chalky consistency and taste. The barium liquid coats the lining of the upper GI tract and makes signs of disease show up more clearly on x rays. X-ray video, called fluoroscopy, is used to view the barium liquid moving through the esophagus, stomach, and duodenum. Additional x rays and fluoroscopy are performed while the patient lies on an x-ray table. To fully coat the upper GI tract with barium liquid, the technologist or radiologist may press on the abdomen or ask the  patient to change position. Patients hold still in various positions, allowing the technologist or radiologist to take x rays of the upper GI tract at different angles. If a technologist conducts the upper GI series, a radiologist will later examine the images to look for problems.  This test typically takes about 1 hour to complete. __________________________________________________________________  Call or send a Mychart message in 10 days with an update.

## 2021-05-18 NOTE — Progress Notes (Signed)
05/18/2021 Amanda [name] [id number] 1951-01-28   HISTORY OF PRESENT ILLNESS: This is a 71 year old female who is a patient of Dr. Doyne Keel.  Her last visit here was in August 2018 except for her colonoscopy in 2020.  She follows here for chronic complaints of dysphagia, GERD, celiac disease, and constipation.  She is here today with complaints of dysphagia and lower abdominal pain that is likely related to her constipation.  Her dysphagia is chronic dating back to at least 2012.  She describes water and food getting stuck somewhat high up and a gurgling sound when trying to get things down.  She says that she has to hold her head back even when drinking water.  Constipation has been an ongoing issue as well.  Appears that she has tried MiraLAX in the past and was offered Linzess previously as well.  She says that currently she is using all kinds of over-the-counter regimens including Dulcolax, enemas, smooth move tea.  She was having lower abdominal pain so went to the emergency department at [facility] where CT scan of the abdomen and pelvis with contrast on 04/17/2021 was unremarkable.  She actually went and had an EGD performed in September 2021 by Dr. Jamey Reas through [place].  This was performed for complaints of pharyngeal dysphagia.  I could not see the actual report, but I can see that duodenal biopsies were taken and showed preserved villoglandular architecture without increased intraepithelial lymphocytes and active inflammation.  Gastric biopsies showed mild chronic inflammation without H. pylori.  Distal esophageal biopsies showed esophageal squamous mucosa with basal cell hyperplasia and reparative changes with no eosinophils identified.  Proximal esophageal biopsies showed the same.  She tells me that they said she no longer has celiac disease.  She has had complaints of dysphagia dating back to 2012 at which time she also had an esophageal  manometry that was normal.  Her last modified barium swallow study was in 03/2016.  She is wanting to know if she needs another endoscopy.   Procedural history: EGD in September 2021 by Dr. Jamey Reas through [place].  This was performed for complaints of pharyngeal dysphagia.  I could not feel the actual report, but I can see that duodenal biopsies were taken and showed preserved villoglandular architecture without increased intraepithelial lymphocytes and active inflammation.  Gastric biopsies showed mild chronic inflammation without H. pylori.  Distal esophageal biopsies showed esophageal squamous mucosa with basal cell hyperplasia and reparative changes with no eosinophils identified.  Proximal esophageal biopsies showed the same. Colonoscopy 11/2018-- One 5 mm polyp in the cecum, removed with a cold snare. Resected and retrieved.  One 4 to 5 mm polyp in the rectum, removed with a hot snare. Resected and retrieved.  Tattoo placed.  The examination was otherwise normal.  Cecal polyp was a sessile serrated polyp and the one from the rectum was a lipoma. Modified barium swallow 03/2016 - no aspiration, mild cricopharyngeal bar EGD 06/09/15 - normal esophagus, no hiatal hernia, showed mild duodenal bulb erythema but biopsies of the 2nd portion of the duodenum and bulb showed no evidence of active celiac.  Colonoscopy 06/09/15 - 2 polyps removed on colonoscopy, the largest a 1cm sessile serrated polyp in the right colon, and another smaller adenoma. biopsies of the colon were taken and no evidence for microscopic colitis. EGD - 07/25/2011 - normal , bx negative for EoE Esophageal manometry 08/04/2010 - reportedly normal per note of Dr  Patterson EGD 11/30/2009 - normal esophagus, biopsies taken to rule out EoE - path negative for reflux Colonoscopy 08/2007 - normal EGD 08/2007 - biopsies taken to rule out EoE, hiatal hernia - path negative for EoE   Past Medical History:   Diagnosis Date   Anemia    Anxiety    Celiac disease    Chronic constipation    COVID 02/12/2021   COVID-19 virus vaccine not available    GERD (gastroesophageal reflux disease)    occasionally (per pt takes apple cider vinager)   Heart palpitations    cardiologist-- dr Ellyn Hack--- event monitor 08-24-2011 epic ;  nuclear stress test 03-16-2011 (epic) normal w/ no ischemia, ef 65%;  echo 01-18-2013  ef 60-65%, G1DD   Hiatal hernia    History of subdural hematoma    12/ 23/ 2010  s/p  craniotomy w/ hematoma evacuation (pt had a fall w/ concussion)  per pt no residual   History of thyroid cancer followed by dr Constance Holster---   s/p  left thyroidectomy --- per pt no radiation and no recurrence   Hyperlipidemia    IBS (irritable bowel syndrome)    Immunization, single disease 06/09/2019   1st dose moderma vaccine administered   Insomnia    OA (osteoarthritis)    Osteopenia, T score -2.1 FRAX 9.4%/1.3% stable from prior DEXA 06/2015   Restless leg syndrome    Right thyroid nodule    followed by dr Constance Holster--- last ultrasound in epic 03-30-2018 stable , to bx   STD (sexually transmitted disease), HSV    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   Wears glasses    Past Surgical History:  Procedure Laterality Date   ABDOMINAL HYSTERECTOMY  1986   ovaries remain   BREAST REDUCTION SURGERY Bilateral 1999   CATARACT EXTRACTION W/ INTRAOCULAR LENS  IMPLANT, BILATERAL  2019   COLONOSCOPY  last one 11-22-2018   ESOPHAGOGASTRODUODENOSCOPY     KNEE ARTHROSCOPY Bilateral 2002   SUBDURAL HEMATOMA EVACUATION VIA CRANIOTOMY  04-30-2009  @[facility]    left frontotemporapartietal    THYROID LOBECTOMY Left 05-04-2001  @[facility]   dr Constance Holster   TONSILLECTOMY     VULVECTOMY N/A 03/12/2019   Procedure: WIDE LOCAL EXCISION OF CARCINOMA IS SITU OF VULVA;  Surgeon: Anastasio Auerbach, MD;  Location: [facility];  Service: Gynecology;  Laterality: N/A;  request to follow in [place] block  time requests one hour colposcope available in OR with acetic acid.   VULVECTOMY N/A 06/17/2019   Procedure: WIDE LOCAL  EXCISION VULVECTOMY;  Surgeon: Everitt Amber, MD;  Location: [facility];  Service: Gynecology;  Laterality: N/A;    reports that she has never smoked. She has never used smokeless tobacco. She reports current alcohol use. She reports that she does not use drugs. family history includes Breast cancer (age of onset: 3) in her maternal aunt; Diabetes in her maternal aunt; Failure to thrive in her mother; Heart disease in her maternal grandfather and maternal grandmother; Hyperlipidemia in her maternal grandfather; Hypertension in her father and paternal grandfather; Liver cancer in her father. Allergies  Allergen Reactions   Ambien [Zolpidem Tartrate] Other (See Comments)    Causes Neurological problems with very bad dizziness, pains, disorientation   Cortisone Other (See Comments)    REACTION: "mania" post ESI   Eszopiclone And Related Other (See Comments)    Causes Neurological problems with very bad dizziness, pains, disorientation   Levofloxacin Other (See Comments)    Muscle spasms and  jerking movements   Dicloxacillin Other (See Comments)    Stomach problems, swelling of tongue   Dilantin [Phenytoin Sodium Extended] Hives and Itching   Erythromycin Other (See Comments)    Tears stomach up   Avelox [Moxifloxacin Hcl In Nacl]     Pt unsure of reaction.   Influenza Vaccines     Guillain Barre in the past- 20 years ago   Other     Celiac disease   Nitrofuran Derivatives Other (See Comments)    unknown   Phenytoin Sodium Extended Rash   Trazodone And Nefazodone Other (See Comments)    dizzy      Outpatient Encounter Medications as of 05/18/2021  Medication Sig   bisacodyl (DULCOLAX) 5 MG EC tablet Take 5 mg by mouth daily as needed for moderate constipation.   CALCIUM PO Take by mouth. Takes 2   cholecalciferol (VITAMIN D3) 25 MCG (1000 UT)  tablet Take 1,000 Units by mouth daily.   clonazePAM (KLONOPIN) 0.5 MG tablet Take 1 tablet (0.5 mg total) by mouth at bedtime.   CO ENZYME Q-10 PO Take by mouth daily.    Cyanocobalamin (VITAMIN B-12 ER PO) Take 5,000 mcg by mouth daily.   escitalopram (LEXAPRO) 10 MG tablet Take 10 mg by mouth daily.   Melatonin 3 MG CAPS Take by mouth at bedtime as needed.    metoprolol succinate (TOPROL-XL) 25 MG 24 hr tablet Take 1 tablet (25 mg total) by mouth daily. Take with or immediately following a meal.   Multiple Vitamin (MULTIVITAMIN) tablet Take 1 tablet by mouth daily.   NON FORMULARY Bausch and lomb ointment-Muro-//128, 5%ointment- Each eye nightly   rOPINIRole (REQUIP) 0.25 MG tablet TAKE 1 TABLET BY MOUTH EVERYDAY AT BEDTIME   rosuvastatin (CRESTOR) 5 MG tablet TAKE 1 TABLET BY MOUTH EVERY DAY   valACYclovir (VALTREX) 500 MG tablet One tab bid as needed for outbreak   No facility-administered encounter medications on file as of 05/18/2021.     REVIEW OF SYSTEMS  : All other systems reviewed and negative except where noted in the History of Present Illness.   PHYSICAL EXAM: BP 130/80 (BP Location: Left Arm, Patient Position: Sitting, Cuff Size: Normal)    Pulse 76    Ht 5' 5.25" (1.657 m) Comment: height measured without shoes   Wt 153 lb 2 oz (69.5 kg)    LMP  (LMP Unknown)    BMI 25.29 kg/m  General: Well developed white female in no acute distress Head: Normocephalic and atraumatic Eyes:  Sclerae anicteric, conjunctiva pink. Ears: Normal auditory acuity Lungs: Clear throughout to auscultation; no W/R/R. Heart: Regular rate and rhythm; no M/R/G. Abdomen: Soft, non-distended.  BS present.  Non-tender. Musculoskeletal: Symmetrical with no gross deformities  Skin: No lesions on visible extremities Extremities: No edema  Neurological: Alert oriented x 4, grossly non-focal Psychological:  Alert and cooperative. Normal mood and affect  ASSESSMENT AND PLAN: *Chronic constipation with  associated lower abdominal pain: Abdominal pain improves when she moves her bowels.  Has tried several over-the-counter regimens.  Believes she tried MiraLAX for some time in the past, but not recently.  We will try Linzess 72 mcg daily.  She was given samples of this.  We have instructed her on a MiraLAX bowel purge, that I would like her to do first and then begin Linzess the day following that.  She well call or message back to give [place] an update on her symptoms in about 10 days. *Dysphagia, sounds possibly pharyngeal,  but possibly esophageal dysmotility as well.  She just had an EGD through [facility] last year that was unremarkable.  She has had dysphagia complaints dating back to 2012 when she had an esophageal manometry that was normal.  She absolutely does not need another EGD.  We will plan for an esophagram to see if that she is dysmotility, etc.  If unremarkable then she may need a modified barium swallow study.   CC:  Marin Olp, MD

## 2021-05-20 NOTE — Progress Notes (Signed)
Agree with assessment and plan as outlined. 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 Her last EGD with me in 2017 was for follow up of celiac but her esophagus was normal then. Sounds like they did not do an empiric dilation at [facility] on last EGD? We can repeat a barium to reassess this - we can consider repeat EGD with empiric dilation moving forward if she has never been dilated before given her extensive workup to date and persistent symptoms. Jess please forward me her barium study when completed. Thanks

## 2021-05-21 ENCOUNTER — Ambulatory Visit (HOSPITAL_COMMUNITY)
Admission: RE | Admit: 2021-05-21 | Discharge: 2021-05-21 | Disposition: A | Discharge status: Home / Self Care | Payer: Medicare Other | Source: Ambulatory Visit | Attending: Gastroenterology | Admitting: Gastroenterology

## 2021-05-21 ENCOUNTER — Other Ambulatory Visit: Payer: Self-pay

## 2021-05-21 DIAGNOSIS — R1310 Dysphagia, unspecified: Secondary | ICD-10-CM | POA: Diagnosis not present

## 2021-05-21 DIAGNOSIS — K449 Diaphragmatic hernia without obstruction or gangrene: Secondary | ICD-10-CM | POA: Diagnosis not present

## 2021-07-02 ENCOUNTER — Telehealth: Payer: Self-pay | Admitting: Cardiology

## 2021-07-02 NOTE — Telephone Encounter (Signed)
Spoke with the patient who states that last night she woke up with palpitations. She states that they lasted for about an hour. She reports that she was monitoring her HR and it would jump up to the 130s and then come back down to the 70s. She denies any other symptoms of shortness of breath, chest pain, lightheadedness or dizziness. She reports that she does not drink caffeine. She states that she does have issues with dehydration but has been using packets of pedialyte daily and trying to increase her water intake. She is taking her metoprolol 25 mg daily. Advised patient to continue with medication and hydration. Will send to Dr. Ellyn Hack for any further advisement.

## 2021-07-02 NOTE — Telephone Encounter (Signed)
Patient c/o Palpitations:  High priority if patient c/o lightheadedness, shortness of breath, or chest pain  How long have you had palpitations/irregular HR/ Afib? Started about 11:00 pm last night  Are you having the symptoms now? no  Are you currently experiencing lightheadedness, SOB or CP? no  Do you have a history of afib (atrial fibrillation) or irregular heart rhythm? yes  Have you checked your BP or HR? (document readings if available):   Patient was checking her HR with a pulse ox. It fluctuated from the 70s to the 130s.   Are you experiencing any other symptoms?   Patient rolled over in bed last night and noticed that her HR started beating quickly. She said this has been happening off and on for a couple months now and she would like to get checked out sooner than her appt for 08/23/21

## 2021-07-06 ENCOUNTER — Other Ambulatory Visit: Payer: Self-pay

## 2021-07-06 ENCOUNTER — Ambulatory Visit (AMBULATORY_SURGERY_CENTER): Payer: Medicare Other | Admitting: Gastroenterology

## 2021-07-06 ENCOUNTER — Encounter: Payer: Self-pay | Admitting: Gastroenterology

## 2021-07-06 VITALS — BP 109/52 | HR 55 | Temp 97.8°F | Resp 13 | Ht 65.0 in | Wt 153.0 lb

## 2021-07-06 DIAGNOSIS — R1310 Dysphagia, unspecified: Secondary | ICD-10-CM | POA: Diagnosis not present

## 2021-07-06 DIAGNOSIS — K222 Esophageal obstruction: Secondary | ICD-10-CM

## 2021-07-06 DIAGNOSIS — I10 Essential (primary) hypertension: Secondary | ICD-10-CM | POA: Diagnosis not present

## 2021-07-06 DIAGNOSIS — K5909 Other constipation: Secondary | ICD-10-CM | POA: Diagnosis not present

## 2021-07-06 DIAGNOSIS — G2581 Restless legs syndrome: Secondary | ICD-10-CM | POA: Diagnosis not present

## 2021-07-06 MED ORDER — SODIUM CHLORIDE 0.9 % IV SOLN: 500.0000 mL | INTRAVENOUS | Status: DC

## 2021-07-06 NOTE — Progress Notes (Signed)
[facility] History and Physical   Primary Care Physician:  Marin Olp, MD   Reason for Procedure:   Dysphagia, abnormal barium study  Plan:    EGD     HPI: Amanda [name] is a 71 y.o. female  here for EGD to treat dysphagia, ongoing dysphagia for years, feels it both in her throat and lower esophagus..Otherwise feels well without any cardiopulmonary symptoms. Barium study shows prominent cricopharyngeus muscle and mild peptic stricture at the GEJ   Past Medical History:  Diagnosis Date   Anemia    Anxiety    Celiac disease    Chronic constipation    COVID 02/12/2021   COVID-19 virus vaccine not available    GERD (gastroesophageal reflux disease)    occasionally (per pt takes apple cider vinager)   Heart palpitations    cardiologist-- dr Ellyn Hack--- event monitor 08-24-2011 epic ;  nuclear stress test 03-16-2011 (epic) normal w/ no ischemia, ef 65%;  echo 01-18-2013  ef 60-65%, G1DD   Hiatal hernia    History of subdural hematoma    12/ 23/ 2010  s/p  craniotomy w/ hematoma evacuation (pt had a fall w/ concussion)  per pt no residual   History of thyroid cancer followed by dr Constance Holster---   s/p  left thyroidectomy --- per pt no radiation and no recurrence   Hyperlipidemia    IBS (irritable bowel syndrome)    Immunization, single disease 06/09/2019   1st dose moderma vaccine administered   Insomnia    OA (osteoarthritis)    Osteopenia, T score -2.1 FRAX 9.4%/1.3% stable from prior DEXA 06/2015   Restless leg syndrome    Right thyroid nodule    followed by dr Constance Holster--- last ultrasound in epic 03-30-2018 stable , to bx   STD (sexually transmitted disease), HSV    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   Wears glasses     Past Surgical History:  Procedure Laterality Date   ABDOMINAL HYSTERECTOMY  1986   ovaries remain   BREAST REDUCTION SURGERY Bilateral 1999   CATARACT EXTRACTION W/ INTRAOCULAR LENS  IMPLANT, BILATERAL  2019    COLONOSCOPY  last one 11-22-2018   ESOPHAGOGASTRODUODENOSCOPY     KNEE ARTHROSCOPY Bilateral 2002   SUBDURAL HEMATOMA EVACUATION VIA CRANIOTOMY  04-30-2009  @[facility]    left frontotemporapartietal    THYROID LOBECTOMY Left 05-04-2001  @[facility]   dr Constance Holster   TONSILLECTOMY     VULVECTOMY N/A 03/12/2019   Procedure: WIDE LOCAL EXCISION OF CARCINOMA IS SITU OF VULVA;  Surgeon: Anastasio Auerbach, MD;  Location: [place];  Service: Gynecology;  Laterality: N/A;  request to follow in [place] block time requests one hour colposcope available in OR with acetic acid.   VULVECTOMY N/A 06/17/2019   Procedure: WIDE LOCAL  EXCISION VULVECTOMY;  Surgeon: Everitt Amber, MD;  Location: [facility];  Service: Gynecology;  Laterality: N/A;    Prior to Admission medications   Medication Sig Start Date End Date Taking? Authorizing Provider  bisacodyl (DULCOLAX) 5 MG EC tablet Take 5 mg by mouth daily as needed for moderate constipation.   Yes [provider]  CALCIUM PO Take by mouth. Takes 2   Yes [provider]  cholecalciferol (VITAMIN D3) 25 MCG (1000 UT) tablet Take 1,000 Units by mouth daily.   Yes [provider]  clonazePAM (KLONOPIN) 0.5 MG tablet Take 1 tablet (0.5 mg total) by mouth at bedtime. 04/07/21  Yes Marin Olp, MD  CO ENZYME Q-10 PO Take by mouth daily.    Yes [provider]  Cyanocobalamin (VITAMIN B-12 ER PO) Take 5,000 mcg by mouth daily.   Yes [provider]  escitalopram (LEXAPRO) 10 MG tablet Take 10 mg by mouth daily. 08/25/19  Yes [provider]  Melatonin 3 MG CAPS Take by mouth at bedtime as needed.    Yes [provider]  metoprolol succinate (TOPROL-XL) 25 MG 24 hr tablet Take 1 tablet (25 mg total) by mouth daily. Take with or immediately following a meal. 01/14/21  Yes Meng, [place], [place]  Multiple Vitamin (MULTIVITAMIN) tablet Take 1 tablet by mouth daily.   Yes [provider]   NON FORMULARY Bausch and lomb ointment-Muro-//128, 5%ointment- Each eye nightly   Yes [provider]  rOPINIRole (REQUIP) 0.25 MG tablet TAKE 1 TABLET BY MOUTH EVERYDAY AT BEDTIME 04/29/21  Yes Marin Olp, MD  rosuvastatin (CRESTOR) 5 MG tablet TAKE 1 TABLET BY MOUTH EVERY DAY 10/19/20  Yes Marin Olp, MD  linaclotide [facility]) 72 MCG capsule Take 1 capsule (72 mcg total) by mouth daily before breakfast. Patient not taking: Reported on 07/06/2021 05/18/21   Zehr, Laban Emperor, PA-C  valACYclovir (VALTREX) 500 MG tablet One tab bid as needed for outbreak 03/26/19   Marin Olp, MD    Current Outpatient Medications  Medication Sig Dispense Refill   bisacodyl (DULCOLAX) 5 MG EC tablet Take 5 mg by mouth daily as needed for moderate constipation.     CALCIUM PO Take by mouth. Takes 2     cholecalciferol (VITAMIN D3) 25 MCG (1000 UT) tablet Take 1,000 Units by mouth daily.     clonazePAM (KLONOPIN) 0.5 MG tablet Take 1 tablet (0.5 mg total) by mouth at bedtime. 90 tablet 1   CO ENZYME Q-10 PO Take by mouth daily.      Cyanocobalamin (VITAMIN B-12 ER PO) Take 5,000 mcg by mouth daily.     escitalopram (LEXAPRO) 10 MG tablet Take 10 mg by mouth daily.     Melatonin 3 MG CAPS Take by mouth at bedtime as needed.      metoprolol succinate (TOPROL-XL) 25 MG 24 hr tablet Take 1 tablet (25 mg total) by mouth daily. Take with or immediately following a meal. 90 tablet 3   Multiple Vitamin (MULTIVITAMIN) tablet Take 1 tablet by mouth daily.     NON FORMULARY Bausch and lomb ointment-Muro-//128, 5%ointment- Each eye nightly     rOPINIRole (REQUIP) 0.25 MG tablet TAKE 1 TABLET BY MOUTH EVERYDAY AT BEDTIME 90 tablet 1   rosuvastatin (CRESTOR) 5 MG tablet TAKE 1 TABLET BY MOUTH EVERY DAY 90 tablet 3   linaclotide (LINZESS) 72 MCG capsule Take 1 capsule (72 mcg total) by mouth daily before breakfast. (Patient not taking: Reported on 07/06/2021) 12 capsule 0   valACYclovir (VALTREX) 500  MG tablet One tab bid as needed for outbreak 180 tablet 1   Current Facility-Administered Medications  Medication Dose Route Frequency Provider Last Rate Last Admin   0.9 %  sodium chloride infusion  500 mL Intravenous Continuous Shayde Gervacio, Carlota Raspberry, MD        Allergies as of 07/06/2021 - Review Complete 07/06/2021  Allergen Reaction Noted   Ambien [zolpidem tartrate] Other (See Comments) 10/16/2012   Cortisone Other (See Comments) 07/22/2009   Eszopiclone and related Other (See Comments) 01/17/2013   Levofloxacin Other (See Comments)    Dicloxacillin Other (See Comments) 02/02/2011   Dilantin [phenytoin sodium extended]  Hives and Itching 02/16/2012   Erythromycin Other (See Comments) 10/03/2006   Avelox [moxifloxacin hcl in nacl]  09/26/2013   Influenza vaccines  03/26/2019   Other  01/21/2015   Nitrofuran derivatives Other (See Comments) 10/16/2012   Phenytoin sodium extended Rash 02/02/2011   Trazodone and nefazodone Other (See Comments) 10/16/2012    Family History  Problem Relation Age of Onset   Failure to thrive Mother        9   Liver cancer Father        alcohol related likely   Hypertension Father    Heart disease Maternal Grandmother    Heart disease Maternal Grandfather    Hyperlipidemia Maternal Grandfather    Hypertension Paternal Grandfather    Diabetes Maternal Aunt    Breast cancer Maternal Aunt 60   Colon cancer Neg Hx    Thyroid disease Neg Hx     Social History   Socioeconomic History   Marital status: Married    Spouse name: Not on file   Number of children: 2   Years of education: Not on file   Highest education level: Not on file  Occupational History   Occupation: retired  Tobacco Use   Smoking status: Never   Smokeless tobacco: Never  Vaping Use   Vaping Use: Never used  Substance and Sexual Activity   Alcohol use: Yes    Comment: 2-3 times weekly   Drug use: Never   Sexual activity: Not Currently    Partners: Male    Birth  control/protection: Surgical    Comment: HYST-1st intercourse 71 yo-Fewer than 5 partners  Other Topics Concern   Not on file  Social History Narrative   Married mother of 55 with 35 year old granddaughter (born in 2019) and another due in 2022. Her son Thurmond Butts is patient of Dr. Yong Channel       Lives with husband in a three story home, no issue with stairs.    Highest level of education is an associates.       She exercises regularly, leads an active lifestyle, has a healthy diet, and is very socially engaged. Self-described "HSP: highly sensitive person" and therefore tends to worry more about symptoms than others might.      Hobbies: enjoys time with friends, time on front porch with friends during covid, park daily, family time   Social Determinants of Health   Financial Resource Strain: Not on file  Food Insecurity: Not on file  Transportation Needs: Not on file  Physical Activity: Not on file  Stress: Not on file  Social Connections: Not on file  Intimate Partner Violence: Not on file    Review of Systems: All other review of systems negative except as mentioned in the HPI.  Physical Exam: Vital signs BP 140/78    Pulse 65    Temp 97.8 F (36.6 C)    Resp 13    Ht 5' 5"  (1.651 m)    Wt 153 lb (69.4 kg)    LMP  (LMP Unknown)    SpO2 95%    BMI 25.46 kg/m   General:   Alert,  Well-developed, pleasant and cooperative in NAD Lungs:  Clear throughout to auscultation.   Heart:  Regular rate and rhythm Abdomen:  Soft, nontender and nondistended.   Neuro/Psych:  Alert and cooperative. Normal mood and affect. A and O x 3  Jolly Mango, MD [facility]

## 2021-07-06 NOTE — Progress Notes (Signed)
Report to PACU, RN, vss, BBS= Clear.  

## 2021-07-06 NOTE — Patient Instructions (Signed)
Handout given on stricture and Dilation Diet.  Clear liquids for 2 hours starting at 10:30 am -12:30 pm then soft diet for the rest of today.  Continue present medications.  YOU HAD AN ENDOSCOPIC PROCEDURE TODAY AT [facility]:   Refer to the procedure report that was given to you for any specific questions about what was found during the examination.  If the procedure report does not answer your questions, please call your gastroenterologist to clarify.  If you requested that your care partner not be given the details of your procedure findings, then the procedure report has been included in a sealed envelope for you to review at your convenience later.  YOU SHOULD EXPECT: Some feelings of bloating in the abdomen. Passage of more gas than usual.  Walking can help get rid of the air that was put into your GI tract during the procedure and reduce the bloating. If you had a lower endoscopy (such as a colonoscopy or flexible sigmoidoscopy) you may notice spotting of blood in your stool or on the toilet paper. If you underwent a bowel prep for your procedure, you may not have a normal bowel movement for a few days.  Please Note:  You might notice some irritation and congestion in your nose or some drainage.  This is from the oxygen used during your procedure.  There is no need for concern and it should clear up in a day or so.  SYMPTOMS TO REPORT IMMEDIATELY:   Following upper endoscopy (EGD)  Vomiting of blood or coffee ground material  New chest pain or pain under the shoulder blades  Painful or persistently difficult swallowing  New shortness of breath  Fever of 100F or higher  Black, tarry-looking stools  For urgent or emergent issues, a gastroenterologist can be reached at any hour by calling [phone]. Do not use MyChart messaging for urgent concerns.    DIET:  We do recommend a small meal at first, but then you may proceed to your regular diet.  Drink plenty of fluids  but you should avoid alcoholic beverages for 24 hours.  ACTIVITY:  You should plan to take it easy for the rest of today and you should NOT DRIVE or use heavy machinery until tomorrow (because of the sedation medicines used during the test).    FOLLOW UP: Our staff will call the number listed on your records 48-72 hours following your procedure to check on you and address any questions or concerns that you may have regarding the information given to you following your procedure. If we do not reach you, we will leave a message.  We will attempt to reach you two times.  During this call, we will ask if you have developed any symptoms of COVID 19. If you develop any symptoms (ie: fever, flu-like symptoms, shortness of breath, cough etc.) before then, please call [phone].  If you test positive for Covid 19 in the 2 weeks post procedure, please call and report this information to [place].    If any biopsies were taken you will be contacted by phone or by letter within the next 1-3 weeks.  Please call us at [phone] if you have not heard about the biopsies in 3 weeks.    SIGNATURES/CONFIDENTIALITY: You and/or your care partner have signed paperwork which will be entered into your electronic medical record.  These signatures attest to the fact that that the information above on your After Visit Summary has been reviewed and  is understood.  Full responsibility of the confidentiality of this discharge information lies with you and/or your care-partner.

## 2021-07-06 NOTE — Progress Notes (Signed)
Called to room to assist during endoscopic procedure.  Patient ID and intended procedure confirmed with present staff. Received instructions for my participation in the procedure from the performing physician.  

## 2021-07-06 NOTE — Op Note (Signed)
[facility] Patient Name: Yoali Conry Procedure Date: 07/06/2021 10:09 AM MRN: [id number] Endoscopist: Remo Lipps P. Havery Moros , MD Age: 71 Referring MD:  Date of Birth: 10-04-1950 Gender: Female Account #: [id number] Procedure:                Upper GI endoscopy Indications:              Dysphagia, Abnormal esophagram with prominent                            cricopharyngeus muscle and also suggestion of                            distal esophageal stricture Medicines:                Monitored Anesthesia Care Procedure:                Pre-Anesthesia Assessment:                           - Prior to the procedure, a History and Physical                            was performed, and patient medications and                            allergies were reviewed. The patient's tolerance of                            previous anesthesia was also reviewed. The risks                            and benefits of the procedure and the sedation                            options and risks were discussed with the patient.                            All questions were answered, and informed consent                            was obtained. Prior Anticoagulants: The patient has                            taken no previous anticoagulant or antiplatelet                            agents. ASA Grade Assessment: II - A patient with                            mild systemic disease. After reviewing the risks                            and benefits, the patient was deemed in  satisfactory condition to undergo the procedure.                           After obtaining informed consent, the endoscope was                            passed under direct vision. Throughout the                            procedure, the patient's blood pressure, pulse, and                            oxygen saturations were monitored continuously. The                            Endoscope was introduced  through the mouth, and                            advanced to the second part of duodenum. The upper                            GI endoscopy was accomplished without difficulty.                            The patient tolerated the procedure well. Scope In: Scope Out: Findings:                 Esophagogastric landmarks were identified: the                            Z-line was found at 37 cm, the gastroesophageal                            junction was found at 37 cm and the upper extent of                            the gastric folds was found at 37 cm from the                            incisors. Sliding 1cm hiatal hernia.                           One benign-appearing, intrinsic mild stenosis was                            found 37 cm from the incisors. This stenosis                            measured less than one cm (in length). A guidewire                            was placed and the scope was withdrawn. Dilation  was performed with a Savary dilator with mild                            resistance at 17 mm and 18 mm. Relook endoscopy                            showed an appropriate mucosal wrent at the                            stricture. No mucosal wrents near the proximal                            esophagus.                           The exam of the esophagus was otherwise normal.                           The entire examined stomach was normal.                           The duodenal bulb and second portion of the                            duodenum were normal other than mild erythema of                            the duodenal bulb and diminutive AVM in second                            portion of the duodenum. Complications:            No immediate complications. Estimated blood loss:                            Minimal. Estimated Blood Loss:     Estimated blood loss was minimal. Impression:               - Esophagogastric landmarks identified.                            - Sliding 1cm hiatal hernia                           - Benign-appearing esophageal stenosis. Entire                            esophagus dilated to 54m with good result.                           - Normal stomach.                           - Normal duodenal bulb and second portion of the  duodenum. Recommendation:           - Patient has a contact number available for                            emergencies. The signs and symptoms of potential                            delayed complications were discussed with the                            patient. Return to normal activities tomorrow.                            Written discharge instructions were provided to the                            patient.                           - Advance diet as tolerated.                           - Continue present medications.                           - Await course post dilation Adalay Azucena P. Havery Moros, MD 07/06/2021 10:36:40 AM This report has been signed electronically.

## 2021-07-07 ENCOUNTER — Ambulatory Visit: Payer: Medicare Other | Admitting: Cardiology

## 2021-07-07 NOTE — Telephone Encounter (Signed)
Left message to call back  

## 2021-07-08 ENCOUNTER — Telehealth: Payer: Self-pay | Admitting: *Deleted

## 2021-07-08 NOTE — Telephone Encounter (Signed)
?  Follow up Call- ? ?Call back number 07/06/2021 11/22/2018  ?Post procedure Call Back phone  # [phone]  ?Permission to leave phone message Yes Yes  ?Some recent data might be hidden  ?  ? ?Patient questions: ? ?Do you have a fever, pain , or abdominal swelling? No. ?Pain Score  0 * ? ?Have you tolerated food without any problems? Yes.   ? ?Have you been able to return to your normal activities? Yes.   ? ?Do you have any questions about your discharge instructions: ?Diet   No. ?Medications  No. ?Follow up visit  No. ? ?Do you have questions or concerns about your Care? No. ? ?Actions: ?* If pain score is 4 or above: ?No action needed, pain <4. ? ?Have you developed a fever since your procedure? no ? ?2.   Have you had an respiratory symptoms (SOB or cough) since your procedure? no ? ?3.   Have you tested positive for COVID 19 since your procedure no ? ?4.   Have you had any family members/close contacts diagnosed with the COVID 19 since your procedure?  no ? ? ?If yes to any of these questions please route to Joylene John, RN and Joella Prince, RN ? ? ? ?

## 2021-07-08 NOTE — Telephone Encounter (Signed)
?  Follow up Call- ? ?Call back number 07/06/2021 11/22/2018  ?Post procedure Call Back phone  # [phone]  ?Permission to leave phone message Yes Yes  ?Some recent data might be hidden  ?  ?First attempt for follow up phone call. No answer at number given.  Left message on voicemail.   ?

## 2021-07-08 NOTE — Telephone Encounter (Signed)
Patient states she is feeling fine. She walked in the park today without any chest pain, sob, or racing heart. I explained the following notation from Dr. Ellyn Hack to her. ? ?"If she has another episode like this, we should see if we can get her set up to have an event monitor.  ?Probably least a 14-day Zio patch monitor.  If she has access to [facility] - would help to see a rhythm strip.  ?OK to take additional dose of Metoprolol PRN for spells of increased HR.  ? ?Glenetta Hew, MD"  ? ?Patient had no questions/concerns at this time and thanked me for calling her back. ?

## 2021-07-12 DIAGNOSIS — D1801 Hemangioma of skin and subcutaneous tissue: Secondary | ICD-10-CM | POA: Diagnosis not present

## 2021-07-12 DIAGNOSIS — D2271 Melanocytic nevi of right lower limb, including hip: Secondary | ICD-10-CM | POA: Diagnosis not present

## 2021-07-12 DIAGNOSIS — Z85828 Personal history of other malignant neoplasm of skin: Secondary | ICD-10-CM | POA: Diagnosis not present

## 2021-07-12 DIAGNOSIS — D2272 Melanocytic nevi of left lower limb, including hip: Secondary | ICD-10-CM | POA: Diagnosis not present

## 2021-07-12 DIAGNOSIS — D2261 Melanocytic nevi of right upper limb, including shoulder: Secondary | ICD-10-CM | POA: Diagnosis not present

## 2021-07-12 DIAGNOSIS — L821 Other seborrheic keratosis: Secondary | ICD-10-CM | POA: Diagnosis not present

## 2021-07-12 DIAGNOSIS — D2262 Melanocytic nevi of left upper limb, including shoulder: Secondary | ICD-10-CM | POA: Diagnosis not present

## 2021-07-12 DIAGNOSIS — L820 Inflamed seborrheic keratosis: Secondary | ICD-10-CM | POA: Diagnosis not present

## 2021-07-12 DIAGNOSIS — D225 Melanocytic nevi of trunk: Secondary | ICD-10-CM | POA: Diagnosis not present

## 2021-07-22 ENCOUNTER — Ambulatory Visit (INDEPENDENT_AMBULATORY_CARE_PROVIDER_SITE_OTHER): Payer: Medicare Other | Admitting: Physician Assistant

## 2021-07-22 ENCOUNTER — Encounter: Payer: Self-pay | Admitting: Physician Assistant

## 2021-07-22 ENCOUNTER — Ambulatory Visit (INDEPENDENT_AMBULATORY_CARE_PROVIDER_SITE_OTHER): Payer: Medicare Other

## 2021-07-22 ENCOUNTER — Other Ambulatory Visit: Payer: Self-pay

## 2021-07-22 VITALS — BP 128/68 | HR 64 | Ht 65.0 in | Wt 154.4 lb

## 2021-07-22 DIAGNOSIS — R002 Palpitations: Secondary | ICD-10-CM

## 2021-07-22 NOTE — Patient Instructions (Addendum)
Medication Instructions:  ?Your physician recommends that you continue on your current medications as directed. Please refer to the Current Medication list given to you today. ? ?*If you need a refill on your cardiac medications before your next appointment, please call your pharmacy* ? ? ?Lab Work: ?NONE ordered at this time of appointment  ? ?If you have labs (blood work) drawn today and your tests are completely normal, you will receive your results only by: ?MyChart Message (if you have MyChart) OR ?A paper copy in the mail ?If you have any lab test that is abnormal or we need to change your treatment, we will call you to review the results. ? ? ?Testing/Procedures: ?ZIO XT- Long Term Monitor Instructions ? ?Your physician has requested you wear a ZIO patch monitor for 14 days.  ?This is a single patch monitor. [employer] supplies one patch monitor per enrollment. Additional ?stickers are not available. Please do not apply patch if you will be having a Nuclear Stress Test,  ?Echocardiogram, Cardiac CT, MRI, or Chest Xray during the period you would be wearing the  ?monitor. The patch cannot be worn during these tests. You cannot remove and re-apply the  ?ZIO XT patch monitor.  ?Your ZIO patch monitor will be mailed 3 day USPS to your address on file. It may take 3-5 days  ?to receive your monitor after you have been enrolled.  ?Once you have received your monitor, please review the enclosed instructions. Your monitor  ?has already been registered assigning a specific monitor serial # to you. ? ?Billing and Patient Assistance Program Information ? ?We have supplied [employer] with any of your insurance information on file for billing purposes. ?[employer] offers a sliding scale Patient Assistance Program for patients that do not have  ?insurance, or whose insurance does not completely cover the cost of the ZIO monitor.  ?You must apply for the Patient Assistance Program to qualify for this discounted rate.  ?To apply,  please call [employer] at [phone], select option 4, select option 2, ask to apply for  ?Patient Assistance Program. Theodore Demark will ask your household income, and how many people  ?are in your household. They will quote your out-of-pocket cost based on that information.  ?[employer] will also be able to set up a 72-month interest-free payment plan if needed. ? ?Applying the monitor ?  ?Shave hair from upper left chest.  ?Hold abrader disc by orange tab. Rub abrader in 40 strokes over the upper left chest as  ?indicated in your monitor instructions.  ?Clean area with 4 enclosed alcohol pads. Let dry.  ?Apply patch as indicated in monitor instructions. Patch will be placed under collarbone on left  ?side of chest with arrow pointing upward.  ?Rub patch adhesive wings for 2 minutes. Remove white label marked "1". Remove the white  ?label marked "2". Rub patch adhesive wings for 2 additional minutes.  ?While looking in a mirror, press and release button in center of patch. A small green light will  ?flash 3-4 times. This will be your only indicator that the monitor has been turned on.  ?Do not shower for the first 24 hours. You may shower after the first 24 hours.  ?Press the button if you feel a symptom. You will hear a small click. Record Date, Time and  ?Symptom in the Patient Logbook.  ?When you are ready to remove the patch, follow instructions on the last 2 pages of Patient  ?Logbook. Stick patch monitor onto the last page  of Patient Logbook.  ?Place Patient Logbook in the blue and white box. Use locking tab on box and tape box closed  ?securely. The blue and white box has prepaid postage on it. Please place it in the mailbox as  ?soon as possible. Your physician should have your test results approximately 7 days after the  ?monitor has been mailed back to [facility].  ?Call [facility] at [phone] if you have questions regarding  ?your ZIO XT patch monitor. Call them immediately if you see  an orange light blinking on your  ?monitor.  ?If your monitor falls off in less than 4 days, contact our Monitor department at [phone].  ?If your monitor becomes loose or falls off after 4 days call [employer] at [phone] for  ?suggestions on securing your monitor ? ? ?Follow-Up: ?At [facility], you and your health needs are our priority.  As part of our continuing mission to provide you with exceptional heart care, we have created designated Provider Care Teams.  These Care Teams include your primary Cardiologist (physician) and Advanced Practice Providers (APPs -  Physician Assistants and Nurse Practitioners) who all work together to provide you with the care you need, when you need it. ?  ?Your next appointment:   ?6-7 week(s) ? ?The format for your next appointment:   ?In Person ? ?Provider:   ?Almyra Deforest, PA-C      ? ?Other Instructions ? ? ?

## 2021-07-22 NOTE — Progress Notes (Unsigned)
Enrolled patient for a 14 day Zio XT monitor to be mailed to patients home  ? ?Dr Ellyn Hack to read ?

## 2021-07-22 NOTE — Progress Notes (Signed)
?Cardiology Office Note:   ? ?Date:  07/23/2021  ? ?ID:  Amanda [name], DOB 08/08/1950, MRN [id number] ? ?PCP:  Marin Olp, MD ?  ?[facility] Providers ?Cardiologist:  Glenetta Hew, MD    ? ?Referring MD: Marin Olp, MD  ? ?Chief Complaint  ?Patient presents with  ? Follow-up  ?  Seen for Dr. Ellyn Hack  ? ? ?History of Present Illness:   ? ?Amanda [name] is a 71 y.o. female with a hx of celiac disease, GERD, palpitation, hyperlipidemia, restless leg syndrome, and a history of atypical chest pain.  She is a former patient of Dr. Rollene Fare. She has been unable to tolerate statins because of memory issues and this was changed to Zetia.  Previous treadmill nuclear stress test obtained in November 2012 showed EF 65%, patient was able to achieve 11 METS of activity, there was no significant ischemia demonstrated, overall low risk study.  Echocardiogram obtained on 01/18/2013 showed EF 60 to 65%, grade 1 DD.  She was able to tolerate a very low-dose of Crestor 5 mg.  During her previous visit with Dr. Ellyn Hack in October 2021, Dr. Ellyn Hack recommended a coronary calcium scoring, however she was not ready at the time.  In May 2022, she complained of worsening palpitation.  The duration of the palpitation only lasted a few seconds at a time, TSH and a free T4 were normal, I recommend to increase metoprolol succinate to 50 mg daily.  Given the short duration of the palpitation, I decided to hold off on ordering a heart monitor at the time.  I last saw the patient in September 2022, heart palpitation remain quite rare and very transient only lasting 2 to 3 seconds each time.  She had too much fatigue on the higher dose of metoprolol and eventually back down to metoprolol to 25 mg daily. ? ?She presented to the ED in November 2022 with acute onset of lightheadedness with heart palpitation after she went out to eat and had 2 sips of vodka.  EKG shows sinus rhythm with no acute arrhythmia or ischemic  changes.  CT of the head was negative.  Lab work including CBC, TSH and a basic metabolic panel were normal except for borderline low potassium of 3.4.  Troponin was negative.  She was seen in December 2022 in the emergency room with right lower quadrant abdominal pain.  More recently, we received a phone call in early March 2023 with onset of dizziness and the palpitation. ? ?Patient presents today for evaluation of palpitation.  She had greater than 30-minute onset of palpitation and dizziness earlier this month.  Heart rate went up to 130s at rest.  She had associated symptom of dizzy spell during the palpitation.  Otherwise she denies any exertional chest pain or worsening dyspnea.  She does have occasional atypical chest discomfort which she attributed to acid reflux.  This does not occur with physical exertion.  I recommended continue observation for the chest discomfort.  As far as her palpitation, I recommended a 2-week ZIO monitor.  We also discussed Kardia mobile device as well.  Heart recent TSH was normal.  I plan to see the patient back in 6 weeks. ? ? ?Past Medical History:  ?Diagnosis Date  ? Anemia   ? Anxiety   ? Celiac disease   ? Chronic constipation   ? COVID 02/12/2021  ? COVID-19 virus vaccine not available   ? GERD (gastroesophageal reflux disease)   ? occasionally (per  pt takes apple cider vinager)  ? Heart palpitations   ? cardiologist-- dr Ellyn Hack--- event monitor 08-24-2011 epic ;  nuclear stress test 03-16-2011 (epic) normal w/ no ischemia, ef 65%;  echo 01-18-2013  ef 60-65%, G1DD  ? Hiatal hernia   ? History of subdural hematoma   ? 12/ 23/ 2010  s/p  craniotomy w/ hematoma evacuation (pt had a fall w/ concussion)  per pt no residual  ? History of thyroid cancer followed by dr Constance Holster---  ? s/p  left thyroidectomy --- per pt no radiation and no recurrence  ? Hyperlipidemia   ? IBS (irritable bowel syndrome)   ? Immunization, single disease 06/09/2019  ? 1st dose moderma vaccine  administered  ? Insomnia   ? OA (osteoarthritis)   ? Osteopenia, T score -2.1 FRAX 9.4%/1.3% stable from prior DEXA 06/2015  ? Restless leg syndrome   ? Right thyroid nodule   ? followed by dr Constance Holster--- last ultrasound in epic 03-30-2018 stable , to bx  ? STD (sexually transmitted disease), HSV   ? 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 Wears glasses   ? ? ?Past Surgical History:  ?Procedure Laterality Date  ? ABDOMINAL HYSTERECTOMY  1986  ? ovaries remain  ? BREAST REDUCTION SURGERY Bilateral 1999  ? CATARACT EXTRACTION W/ INTRAOCULAR LENS  IMPLANT, BILATERAL  2019  ? COLONOSCOPY  last one 11-22-2018  ? ESOPHAGOGASTRODUODENOSCOPY    ? KNEE ARTHROSCOPY Bilateral 2002  ? SUBDURAL HEMATOMA EVACUATION VIA CRANIOTOMY  04-30-2009  @[facility]   ? left frontotemporapartietal   ? THYROID LOBECTOMY Left 05-04-2001  @[facility]   dr Constance Holster  ? TONSILLECTOMY    ? VULVECTOMY N/A 03/12/2019  ? Procedure: WIDE LOCAL EXCISION OF CARCINOMA IS SITU OF VULVA;  Surgeon: Anastasio Auerbach, MD;  Location: [place];  Service: Gynecology;  Laterality: N/A;  request to follow in [place] block time ?requests one hour ?colposcope available in OR with acetic acid.  ? VULVECTOMY N/A 06/17/2019  ? Procedure: WIDE LOCAL  EXCISION VULVECTOMY;  Surgeon: Everitt Amber, MD;  Location: [facility];  Service: Gynecology;  Laterality: N/A;  ? ? ?Current Medications: ?Current Meds  ?Medication Sig  ? bisacodyl (DULCOLAX) 5 MG EC tablet Take 5 mg by mouth daily as needed for moderate constipation.  ? CALCIUM PO Take by mouth. Takes 2  ? cholecalciferol (VITAMIN D3) 25 MCG (1000 UT) tablet Take 1,000 Units by mouth daily.  ? clonazePAM (KLONOPIN) 0.5 MG tablet Take 1 tablet (0.5 mg total) by mouth at bedtime.  ? CO ENZYME Q-10 PO Take by mouth daily.   ? Cyanocobalamin (VITAMIN B-12 ER PO) Take 5,000 mcg by mouth daily.  ? escitalopram (LEXAPRO) 10 MG tablet Take 10 mg by mouth daily.  ? Melatonin 3 MG CAPS Take by  mouth at bedtime as needed.   ? metoprolol succinate (TOPROL-XL) 25 MG 24 hr tablet Take 1 tablet (25 mg total) by mouth daily. Take with or immediately following a meal.  ? Multiple Vitamin (MULTIVITAMIN) tablet Take 1 tablet by mouth daily.  ? NON FORMULARY Bausch and lomb ointment-Muro-//128, 5%ointment- Each eye nightly  ? rOPINIRole (REQUIP) 0.25 MG tablet TAKE 1 TABLET BY MOUTH EVERYDAY AT BEDTIME  ? rosuvastatin (CRESTOR) 5 MG tablet TAKE 1 TABLET BY MOUTH EVERY DAY  ? valACYclovir (VALTREX) 500 MG tablet One tab bid as needed for outbreak  ?  ? ?Allergies:   Ambien [zolpidem tartrate], Cortisone, Eszopiclone and related, Levofloxacin, Dicloxacillin, Dilantin [  phenytoin sodium extended], Erythromycin, Avelox [moxifloxacin hcl in nacl], Influenza vaccines, Other, Nitrofuran derivatives, Phenytoin sodium extended, and Trazodone and nefazodone  ? ?Social History  ? ?Socioeconomic History  ? Marital status: Married  ?  Spouse name: Not on file  ? Number of children: 2  ? Years of education: Not on file  ? Highest education level: Not on file  ?Occupational History  ? Occupation: retired  ?Tobacco Use  ? Smoking status: Never  ? Smokeless tobacco: Never  ?Vaping Use  ? Vaping Use: Never used  ?Substance and Sexual Activity  ? Alcohol use: Yes  ?  Comment: 2-3 times weekly  ? Drug use: Never  ? Sexual activity: Not Currently  ?  Partners: Male  ?  Birth control/protection: Surgical  ?  Comment: HYST-1st intercourse 71 yo-Fewer than 5 partners  ?Other Topics Concern  ? Not on file  ?Social History Narrative  ? Married mother of 73 with 75 year old granddaughter (born in 2019) and another due in 2022. Her son Thurmond Butts is patient of Dr. Yong Channel  ?   ?  Lives with husband in a three story home, no issue with stairs.   ? Highest level of education is an associates.   ?   ? She exercises regularly, leads an active lifestyle, has a healthy diet, and is very socially engaged. Self-described "HSP: highly sensitive person" and  therefore tends to worry more about symptoms than others might.  ?   ? Hobbies: enjoys time with friends, time on front porch with friends during covid, park daily, family time  ? ?Social Determinants of Health  ? ?Financi

## 2021-07-23 ENCOUNTER — Encounter: Payer: Self-pay | Admitting: Physician Assistant

## 2021-07-26 DIAGNOSIS — R002 Palpitations: Secondary | ICD-10-CM

## 2021-08-03 ENCOUNTER — Ambulatory Visit: Payer: Medicare Other | Admitting: Student

## 2021-08-20 DIAGNOSIS — R002 Palpitations: Secondary | ICD-10-CM | POA: Diagnosis not present

## 2021-08-23 ENCOUNTER — Ambulatory Visit: Payer: Medicare Other | Admitting: Cardiology

## 2021-09-02 ENCOUNTER — Ambulatory Visit: Payer: Medicare Other | Admitting: Physician Assistant

## 2021-09-09 ENCOUNTER — Ambulatory Visit (INDEPENDENT_AMBULATORY_CARE_PROVIDER_SITE_OTHER): Payer: Medicare Other | Admitting: Physician Assistant

## 2021-09-09 ENCOUNTER — Encounter: Payer: Self-pay | Admitting: Physician Assistant

## 2021-09-09 VITALS — BP 138/80 | HR 64 | Ht 65.0 in | Wt 153.6 lb

## 2021-09-09 DIAGNOSIS — R002 Palpitations: Secondary | ICD-10-CM

## 2021-09-09 MED ORDER — METOPROLOL SUCCINATE ER 25 MG PO TB24: ORAL_TABLET | ORAL | 3 refills | Status: DC

## 2021-09-09 NOTE — Patient Instructions (Signed)
Medication Instructions:  ?May take an extra half tablet of Metoprolol as needed for prolonged palpitations ? ?Your physician recommends that you continue on your current medications as directed. Please refer to the Current Medication list given to you today. ? ?*If you need a refill on your cardiac medications before your next appointment, please call your pharmacy* ? ?Lab Work: ?NONE ordered at this time of appointment  ? ?If you have labs (blood work) drawn today and your tests are completely normal, you will receive your results only by: ?MyChart Message (if you have MyChart) OR ?A paper copy in the mail ?If you have any lab test that is abnormal or we need to change your treatment, we will call you to review the results. ? ?Testing/Procedures: ?NONE ordered at this time of appointment  ? ?Follow-Up: ?At [facility], you and your health needs are our priority.  As part of our continuing mission to provide you with exceptional heart care, we have created designated Provider Care Teams.  These Care Teams include your primary Cardiologist (physician) and Advanced Practice Providers (APPs -  Physician Assistants and Nurse Practitioners) who all work together to provide you with the care you need, when you need it. ? ?Your next appointment:   ?5-6 month(s) ? ?The format for your next appointment:   ?In Person ? ?Provider:   ?Glenetta Hew, MD   ? ?Other Instructions ? ? ?Important Information About Sugar ? ? ? ? ? ? ?

## 2021-09-09 NOTE — Progress Notes (Signed)
?Cardiology Office Note:   ? ?Date:  09/11/2021  ? ?ID:  Amanda [name], DOB 1951/01/18, MRN [id number] ? ?PCP:  Marin Olp, MD ?  ?[facility] Providers ?Cardiologist:  Glenetta Hew, MD    ? ?Referring MD: Marin Olp, MD  ? ?Chief Complaint  ?Patient presents with  ? Follow-up  ?  Seen for Dr. Ellyn Hack  ? ? ?History of Present Illness:   ? ?Amanda [name] is a 71 y.o. female with a hx of celiac disease, GERD, palpitation, hyperlipidemia, restless leg syndrome, and a history of atypical chest pain.  She is a former patient of Dr. Rollene Fare. She has been unable to tolerate statins because of memory issues and this was changed to Zetia.  Previous treadmill nuclear stress test obtained in November 2012 showed EF 65%, patient was able to achieve 11 METS of activity, there was no significant ischemia demonstrated, overall low risk study.  Echocardiogram obtained on 01/18/2013 showed EF 60 to 65%, grade 1 DD.  She was able to tolerate a very low-dose of Crestor 5 mg.  During her previous visit with Dr. Ellyn Hack in October 2021, Dr. Ellyn Hack recommended a coronary calcium scoring, however she was not ready at the time.  In May 2022, she complained of worsening palpitation.  The duration of the palpitation only lasted a few seconds at a time, TSH and a free T4 were normal, I recommend to increase metoprolol succinate to 50 mg daily.  Given the short duration of the palpitation, I decided to hold off on ordering a heart monitor at the time.  I last saw the patient in September 2022, heart palpitation remain quite rare and very transient only lasting 2 to 3 seconds each time.  She had too much fatigue on the higher dose of metoprolol and eventually back down to metoprolol to 25 mg daily. ?  ?She presented to the ED in November 2022 with acute onset of lightheadedness with heart palpitation after she went out to eat and had 2 sips of vodka.  EKG shows sinus rhythm with no acute arrhythmia or ischemic  changes.  CT of the head was negative.  Lab work including CBC, TSH and a basic metabolic panel were normal except for borderline low potassium of 3.4.  Troponin was negative.  She was seen in December 2022 in the emergency room with right lower quadrant abdominal pain.  More recently, we received a phone call in early March 2023 with onset of dizziness and the palpitation. ? ?I last saw the patient on 07/22/2021 for palpitation.  Recent TSH was normal.  I recommended a 2-week ZIO monitor.  Heart monitor showed a 6 episodes of SVT, most of her diary events occurred within 45 seconds of the SVT episodes.  She is on 37.5 mg daily of Toprol-XL.  I recommended continue on this current therapy.  If she has prolonged palpitation, she can take additional 12.5 mg as needed.  Otherwise, she can follow-up in 5 to 6 months. ? ? ? ?Past Medical History:  ?Diagnosis Date  ? Anemia   ? Anxiety   ? Celiac disease   ? Chronic constipation   ? COVID 02/12/2021  ? COVID-19 virus vaccine not available   ? GERD (gastroesophageal reflux disease)   ? occasionally (per pt takes apple cider vinager)  ? Heart palpitations   ? cardiologist-- dr Ellyn Hack--- event monitor 08-24-2011 epic ;  nuclear stress test 03-16-2011 (epic) normal w/ no ischemia, ef 65%;  echo 01-18-2013  ef 60-65%, G1DD  ? Hiatal hernia   ? History of subdural hematoma   ? 12/ 23/ 2010  s/p  craniotomy w/ hematoma evacuation (pt had a fall w/ concussion)  per pt no residual  ? History of thyroid cancer followed by dr Constance Holster---  ? s/p  left thyroidectomy --- per pt no radiation and no recurrence  ? Hyperlipidemia   ? IBS (irritable bowel syndrome)   ? Immunization, single disease 06/09/2019  ? 1st dose moderma vaccine administered  ? Insomnia   ? OA (osteoarthritis)   ? Osteopenia, T score -2.1 FRAX 9.4%/1.3% stable from prior DEXA 06/2015  ? Restless leg syndrome   ? Right thyroid nodule   ? followed by dr Constance Holster--- last ultrasound in epic 03-30-2018 stable , to bx  ? STD  (sexually transmitted disease), HSV   ? 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 Wears glasses   ? ? ?Past Surgical History:  ?Procedure Laterality Date  ? ABDOMINAL HYSTERECTOMY  1986  ? ovaries remain  ? BREAST REDUCTION SURGERY Bilateral 1999  ? CATARACT EXTRACTION W/ INTRAOCULAR LENS  IMPLANT, BILATERAL  2019  ? COLONOSCOPY  last one 11-22-2018  ? ESOPHAGOGASTRODUODENOSCOPY    ? KNEE ARTHROSCOPY Bilateral 2002  ? SUBDURAL HEMATOMA EVACUATION VIA CRANIOTOMY  04-30-2009  @[facility]   ? left frontotemporapartietal   ? THYROID LOBECTOMY Left 05-04-2001  @[facility]   dr Constance Holster  ? TONSILLECTOMY    ? VULVECTOMY N/A 03/12/2019  ? Procedure: WIDE LOCAL EXCISION OF CARCINOMA IS SITU OF VULVA;  Surgeon: Anastasio Auerbach, MD;  Location: [place];  Service: Gynecology;  Laterality: N/A;  request to follow in [place] block time ?requests one hour ?colposcope available in OR with acetic acid.  ? VULVECTOMY N/A 06/17/2019  ? Procedure: WIDE LOCAL  EXCISION VULVECTOMY;  Surgeon: Everitt Amber, MD;  Location[facility];  Service: Gynecology;  Laterality: N/A;  ? ? ?Current Medications: ?Current Meds  ?Medication Sig  ? bisacodyl (DULCOLAX) 5 MG EC tablet Take 5 mg by mouth daily as needed for moderate constipation.  ? CALCIUM PO Take by mouth. Takes 2  ? cholecalciferol (VITAMIN D3) 25 MCG (1000 UT) tablet Take 1,000 Units by mouth daily.  ? clonazePAM (KLONOPIN) 0.5 MG tablet Take 1 tablet (0.5 mg total) by mouth at bedtime.  ? CO ENZYME Q-10 PO Take by mouth daily.   ? Cyanocobalamin (VITAMIN B-12 ER PO) Take 5,000 mcg by mouth daily.  ? escitalopram (LEXAPRO) 10 MG tablet Take 10 mg by mouth daily.  ? Melatonin 3 MG CAPS Take by mouth at bedtime as needed.   ? Multiple Vitamin (MULTIVITAMIN) tablet Take 1 tablet by mouth daily.  ? NON FORMULARY Bausch and lomb ointment-Muro-//128, 5%ointment- Each eye nightly  ? rOPINIRole (REQUIP) 0.25 MG tablet TAKE 1 TABLET BY MOUTH EVERYDAY AT  BEDTIME  ? rosuvastatin (CRESTOR) 5 MG tablet TAKE 1 TABLET BY MOUTH EVERY DAY  ? valACYclovir (VALTREX) 500 MG tablet One tab bid as needed for outbreak  ? [DISCONTINUED] metoprolol succinate (TOPROL-XL) 25 MG 24 hr tablet Take 1 tablet (25 mg total) by mouth daily. Take with or immediately following a meal.  ?  ? ?Allergies:   Ambien [zolpidem tartrate], Cortisone, Eszopiclone and related, Levofloxacin, Dicloxacillin, Dilantin [phenytoin sodium extended], Erythromycin, Avelox [moxifloxacin hcl in nacl], Influenza vaccines, Other, Nitrofuran derivatives, Phenytoin sodium extended, and Trazodone and nefazodone  ? ?Social History  ? ?Socioeconomic History  ? Marital status: Married  ?  Spouse name: Not on file  ? Number of children: 2  ? Years of education: Not on file  ? Highest education level: Not on file  ?Occupational History  ? Occupation: retired  ?Tobacco Use  ? Smoking status: Never  ? Smokeless tobacco: Never  ?Vaping Use  ? Vaping Use: Never used  ?Substance and Sexual Activity  ? Alcohol use: Yes  ?  Comment: 2-3 times weekly  ? Drug use: Never  ? Sexual activity: Not Currently  ?  Partners: Male  ?  Birth control/protection: Surgical  ?  Comment: HYST-1st intercourse 71 yo-Fewer than 5 partners  ?Other Topics Concern  ? Not on file  ?Social History Narrative  ? Married mother of 24 with 69 year old granddaughter (born in 2019) and another due in 2022. Her son Thurmond Butts is patient of Dr. Yong Channel  ?   ?  Lives with husband in a three story home, no issue with stairs.   ? Highest level of education is an associates.   ?   ? She exercises regularly, leads an active lifestyle, has a healthy diet, and is very socially engaged. Self-described "HSP: highly sensitive person" and therefore tends to worry more about symptoms than others might.  ?   ? Hobbies: enjoys time with friends, time on front porch with friends during covid, park daily, family time  ? ?Social Determinants of Health  ? ?Financial Resource Strain: Not  on file  ?Food Insecurity: Not on file  ?Transportation Needs: Not on file  ?Physical Activity: Not on file  ?Stress: Not on file  ?Social Connections: Not on file  ?  ? ?Family History: ?The patient's family hist

## 2021-09-10 ENCOUNTER — Ambulatory Visit: Payer: Medicare Other | Admitting: Physician Assistant

## 2021-09-11 ENCOUNTER — Encounter: Payer: Self-pay | Admitting: Physician Assistant

## 2021-09-29 ENCOUNTER — Telehealth: Payer: Self-pay | Admitting: Family Medicine

## 2021-09-29 NOTE — Telephone Encounter (Signed)
Copied from [place] [id number]. Topic: Medicare AWV >> Sep 29, 2021  1:20 PM Harris-Coley, Hannah Beat wrote: Reason for CRM: Left message for patient to schedule Annual Wellness Visit.  Please schedule with Nurse Health Advisor Charlott Rakes, RN at [facility].  Please call [phone] ask for [facility]

## 2021-10-03 ENCOUNTER — Other Ambulatory Visit: Payer: Self-pay | Admitting: Family Medicine

## 2021-10-05 DIAGNOSIS — Z85828 Personal history of other malignant neoplasm of skin: Secondary | ICD-10-CM | POA: Diagnosis not present

## 2021-10-05 DIAGNOSIS — B078 Other viral warts: Secondary | ICD-10-CM | POA: Diagnosis not present

## 2021-10-05 DIAGNOSIS — D485 Neoplasm of uncertain behavior of skin: Secondary | ICD-10-CM | POA: Diagnosis not present

## 2021-10-11 ENCOUNTER — Encounter: Payer: Self-pay | Admitting: Nurse Practitioner

## 2021-10-11 ENCOUNTER — Telehealth: Payer: Self-pay | Admitting: *Deleted

## 2021-10-11 ENCOUNTER — Ambulatory Visit (INDEPENDENT_AMBULATORY_CARE_PROVIDER_SITE_OTHER): Payer: Medicare Other | Admitting: Nurse Practitioner

## 2021-10-11 VITALS — BP 130/78 | HR 63 | Resp 20

## 2021-10-11 DIAGNOSIS — N644 Mastodynia: Secondary | ICD-10-CM

## 2021-10-11 DIAGNOSIS — R350 Frequency of micturition: Secondary | ICD-10-CM

## 2021-10-11 NOTE — Telephone Encounter (Signed)
Patient informed. 

## 2021-10-11 NOTE — Telephone Encounter (Signed)
-----   Message from Tamela Gammon, NP sent at 10/11/2021 12:19 PM EDT ----- Regarding: Right breast ultrasound Please send referral for right breast ultrasound for pain. Thanks.

## 2021-10-11 NOTE — Progress Notes (Signed)
   Acute Office Visit  Subjective:    Patient ID: Amanda [name], female    DOB: [birth year], 71 y.o.   MRN: [id number]   HPI 71 y.o. presents today for pain in right upper outer breast x 1 week. She cannot think of an injury but she did go to the chiropractor 2 weeks ago. She noticed pain with bending down and it is very tender to the touch. Denies any other breast symptoms. Normal mammogram August 2022. Maternal aunt with history of breast cancer at age 32. Also reports fatigue, nausea, and urinary frequency. She feels the nausea has improved. She struggles with dehydration and has been trying to drinking more fluids. Denies any changes in bowels.    Review of Systems  Constitutional:  Positive for fatigue. Negative for fever.  Gastrointestinal:  Positive for nausea.  Genitourinary:  Positive for frequency. Negative for difficulty urinating, dysuria, flank pain, hematuria, pelvic pain and urgency.  Hematological:  Negative for adenopathy.  Right breast: Positive for pain. Negative for mass, nippled discharge, redness, swelling, or skin changes.  Left breast: Negative.    Objective:    Physical Exam Constitutional:      Appearance: Normal appearance.  Chest:  Breasts:    Right: Tenderness present. No swelling, bleeding, inverted nipple, mass, nipple discharge or skin change.     Left: Normal.    Lymphadenopathy:     Upper Body:     Right upper body: No supraclavicular or axillary adenopathy.  GU: Not indicated  BP 130/78 (BP Location: Left Arm, Patient Position: Sitting)   Pulse 63   Resp 20   LMP  (LMP Unknown)   SpO2 98%  Wt Readings from Last 3 Encounters:  09/09/21 153 lb 9.6 oz (69.7 kg)  07/22/21 154 lb 6.4 oz (70 kg)  07/06/21 153 lb (69.4 kg)        Patient informed chaperone available to be present for breast and/or pelvic exam. Patient has requested no chaperone to be present. Patient has been advised what will be completed during breast and pelvic exam.    UA trace leukocytes, negative nitrites, negative blood, pH 6.0, dark yellow/slightly cloudy. Microscopic: wbc 0-5, rbc 0-2, few bacteria  Assessment & Plan:   Problem List Items Addressed This Visit   None Visit Diagnoses     Pain of right breast    -  Primary   Urinary frequency       Relevant Orders   Urinalysis,Complete w/RFL Culture      Plan: Pain could be musculoskeletal such as costochondritis, which she has had in the past. Recommend rest and minimal use of right-sided chest muscles. Option to monitor for 1-2 weeks to see if this improves versus imaging. She would like breast imaging done. Will send referral for this. UA unremarkable, reflex culture pending.      Tamela Gammon DNP, 12:37 PM 10/11/2021

## 2021-10-11 NOTE — Telephone Encounter (Signed)
Orders placed at the breast center, scheduled on 10/20/21 @ 10:00am . Left message for patient to call.

## 2021-10-13 LAB — URINALYSIS, COMPLETE W/RFL CULTURE
Bilirubin Urine: NEGATIVE
Glucose, UA: NEGATIVE
Hyaline Cast: NONE SEEN /LPF
Ketones, ur: NEGATIVE
Nitrites, Initial: NEGATIVE
Protein, ur: NEGATIVE
Specific Gravity, Urine: 1.015 (ref 1.001–1.035)
pH: 6 (ref 5.0–8.0)

## 2021-10-13 LAB — URINE CULTURE
MICRO NUMBER:: 13483323
SPECIMEN QUALITY:: ADEQUATE

## 2021-10-13 LAB — CULTURE INDICATED

## 2021-10-15 ENCOUNTER — Encounter: Payer: Self-pay | Admitting: Family Medicine

## 2021-10-15 ENCOUNTER — Ambulatory Visit (INDEPENDENT_AMBULATORY_CARE_PROVIDER_SITE_OTHER): Payer: Medicare Other | Admitting: Family Medicine

## 2021-10-15 VITALS — BP 112/76 | HR 68 | Temp 98.1°F | Ht 65.0 in | Wt 155.1 lb

## 2021-10-15 DIAGNOSIS — J020 Streptococcal pharyngitis: Secondary | ICD-10-CM | POA: Diagnosis not present

## 2021-10-15 LAB — POCT RAPID STREP A (OFFICE): Rapid Strep A Screen: POSITIVE — AB

## 2021-10-15 MED ORDER — AZITHROMYCIN 250 MG PO TABS: ORAL_TABLET | ORAL | 0 refills | Status: DC

## 2021-10-15 NOTE — Progress Notes (Signed)
Subjective:     Patient ID: Amanda [name], female    DOB: [birth year], 71 y.o.   MRN: [id number]  Chief Complaint  Patient presents with   Burning sensation in throat    Burning sensation in throat that started 5 or 6 days ago, not getting better   Hoarse    HPI Burning throat for 5-6 days, hoarse.  No cough, f/c.  Took claritin this am and some better.  No congestion/drainage.  No v/d. Taking applecider for GERD.  There are no preventive care reminders to display for this patient.  Past Medical History:  Diagnosis Date   Anemia    Anxiety    Celiac disease    Chronic constipation    COVID 02/12/2021   COVID-19 virus vaccine not available    GERD (gastroesophageal reflux disease)    occasionally (per pt takes apple cider vinager)   Heart palpitations    cardiologist-- dr Ellyn Hack--- event monitor 08-24-2011 epic ;  nuclear stress test 03-16-2011 (epic) normal w/ no ischemia, ef 65%;  echo 01-18-2013  ef 60-65%, G1DD   Hiatal hernia    History of subdural hematoma    12/ 23/ 2010  s/p  craniotomy w/ hematoma evacuation (pt had a fall w/ concussion)  per pt no residual   History of thyroid cancer followed by dr Constance Holster---   s/p  left thyroidectomy --- per pt no radiation and no recurrence   Hyperlipidemia    IBS (irritable bowel syndrome)    Immunization, single disease 06/09/2019   1st dose moderma vaccine administered   Insomnia    OA (osteoarthritis)    Osteopenia, T score -2.1 FRAX 9.4%/1.3% stable from prior DEXA 06/2015   Restless leg syndrome    Right thyroid nodule    followed by dr Constance Holster--- last ultrasound in epic 03-30-2018 stable , to bx   STD (sexually transmitted disease), HSV    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   Wears glasses     Past Surgical History:  Procedure Laterality Date   ABDOMINAL HYSTERECTOMY  1986   ovaries remain   BREAST REDUCTION SURGERY Bilateral 1999   CATARACT EXTRACTION W/ INTRAOCULAR LENS  IMPLANT,  BILATERAL  2019   COLONOSCOPY  last one 11-22-2018   ESOPHAGOGASTRODUODENOSCOPY     KNEE ARTHROSCOPY Bilateral 2002   SUBDURAL HEMATOMA EVACUATION VIA CRANIOTOMY  04-30-2009  @[facility]    left frontotemporapartietal    THYROID LOBECTOMY Left 05-04-2001  @[facility]   dr Constance Holster   TONSILLECTOMY     VULVECTOMY N/A 03/12/2019   Procedure: WIDE LOCAL EXCISION OF CARCINOMA IS SITU OF VULVA;  Surgeon: Anastasio Auerbach, MD;  Location: [facility];  Service: Gynecology;  Laterality: N/A;  request to follow in [place] block time requests one hour colposcope available in OR with acetic acid.   VULVECTOMY N/A 06/17/2019   Procedure: WIDE LOCAL  EXCISION VULVECTOMY;  Surgeon: Everitt Amber, MD;  Location: [facility];  Service: Gynecology;  Laterality: N/A;    Outpatient Medications Prior to Visit  Medication Sig Dispense Refill   bisacodyl (DULCOLAX) 5 MG EC tablet Take 5 mg by mouth daily as needed for moderate constipation.     CALCIUM PO Take by mouth. Takes 2     cholecalciferol (VITAMIN D3) 25 MCG (1000 UT) tablet Take 1,000 Units by mouth daily.     clonazePAM (KLONOPIN) 0.5 MG tablet Take 1 tablet (0.5 mg total) by mouth at bedtime. 90 tablet 1  CO ENZYME Q-10 PO Take by mouth daily.      Cyanocobalamin (VITAMIN B-12 ER PO) Take 5,000 mcg by mouth daily.     escitalopram (LEXAPRO) 10 MG tablet Take 10 mg by mouth daily.     Melatonin 3 MG CAPS Take by mouth at bedtime as needed.      metoprolol succinate (TOPROL-XL) 25 MG 24 hr tablet Take 1.5 tablets daily. May take a half tablet as needed for palpitations 135 tablet 3   Multiple Vitamin (MULTIVITAMIN) tablet Take 1 tablet by mouth daily.     NON FORMULARY Bausch and lomb ointment-Muro-//128, 5%ointment- Each eye nightly     rOPINIRole (REQUIP) 0.25 MG tablet TAKE 1 TABLET BY MOUTH EVERYDAY AT BEDTIME 90 tablet 1   rosuvastatin (CRESTOR) 5 MG tablet TAKE 1 TABLET BY MOUTH EVERY DAY 90 tablet 3   valACYclovir (VALTREX)  500 MG tablet One tab bid as needed for outbreak 180 tablet 1   No facility-administered medications prior to visit.    Allergies  Allergen Reactions   Ambien [Zolpidem Tartrate] Other (See Comments)    Causes Neurological problems with very bad dizziness, pains, disorientation   Cortisone Other (See Comments)    REACTION: "mania" post ESI   Eszopiclone And Related Other (See Comments)    Causes Neurological problems with very bad dizziness, pains, disorientation   Levofloxacin Other (See Comments)    Muscle spasms and jerking movements   Dicloxacillin Other (See Comments)    Stomach problems, swelling of tongue   Dilantin [Phenytoin Sodium Extended] Hives and Itching   Erythromycin Other (See Comments)    Tears stomach up   Avelox [Moxifloxacin Hcl In Nacl]     Pt unsure of reaction.   Other     Celiac disease   Nitrofuran Derivatives Other (See Comments)    unknown   Phenytoin Sodium Extended Rash   Trazodone And Nefazodone Other (See Comments)    dizzy   ROS neg/noncontributory except as noted HPI/below      Objective:     BP 112/76   Pulse 68   Temp 98.1 F (36.7 C) (Temporal)   Ht 5' 5"  (1.651 m)   Wt 155 lb 2 oz (70.4 kg)   LMP  (LMP Unknown)   SpO2 97%   BMI 25.81 kg/m  Wt Readings from Last 3 Encounters:  10/15/21 155 lb 2 oz (70.4 kg)  09/09/21 153 lb 9.6 oz (69.7 kg)  07/22/21 154 lb 6.4 oz (70 kg)    Physical Exam   Gen: WDWN NAD HEENT: NCAT, conjunctiva not injected, sclera nonicteric TM WNL B, OP moist, no exudates  NECK:  supple, no thyromegaly, no nodes, no carotid bruits CARDIAC: RRR, S1S2+, no murmur. DP 2+B LUNGS: CTAB. No wheezess EXT:  no edema MSK: no gross abnormalities.  NEURO: A&O x3.  CN II-XII intact.  PSYCH: normal mood. Good eye contact  Results for orders placed or performed in visit on 10/15/21  POCT rapid strep A  Result Value Ref Range   Rapid Strep A Screen Positive (A) Negative        Assessment & Plan:    Problem List Items Addressed This Visit   None Visit Diagnoses     Pharyngitis due to Streptococcus species    -  Primary   Relevant Medications   azithromycin (ZITHROMAX) 250 MG tablet   Other Relevant Orders   POCT rapid strep A (Completed)      Pharyntitis-suspect allergic as better w/claritin.  Continue symptomatic tx.  Surprisingly, strep came up slightly + so will need to tx-zpk.    Meds ordered this encounter  Medications   azithromycin (ZITHROMAX) 250 MG tablet    Sig: Take 2 tablets on day 1, then 1 tablet daily on days 2 through 5    Dispense:  6 tablet    Refill:  0    Wellington Hampshire, MD

## 2021-10-15 NOTE — Patient Instructions (Addendum)
Hopefully allergies.  Continue claritn if needed.   See podiatry for toenails

## 2021-10-19 ENCOUNTER — Ambulatory Visit (INDEPENDENT_AMBULATORY_CARE_PROVIDER_SITE_OTHER): Payer: Medicare Other | Admitting: Obstetrics & Gynecology

## 2021-10-19 ENCOUNTER — Encounter: Payer: Self-pay | Admitting: Obstetrics & Gynecology

## 2021-10-19 VITALS — BP 112/70

## 2021-10-19 DIAGNOSIS — D071 Carcinoma in situ of vulva: Secondary | ICD-10-CM | POA: Diagnosis not present

## 2021-10-19 DIAGNOSIS — R102 Pelvic and perineal pain: Secondary | ICD-10-CM | POA: Diagnosis not present

## 2021-10-19 NOTE — Progress Notes (Incomplete)
    SURABHI GADEA 1950/11/12 [id number]        71 y.o.  G2P2002   RP: ***  HPI: ***   OB History  Gravida Para Term Preterm AB Living  2 2 2     2   SAB IAB Ectopic Multiple Live Births               # Outcome Date GA Lbr Len/2nd Weight Sex Delivery Anes PTL Lv  2 Term           1 Term             Past medical history,surgical history, problem list, medications, allergies, family history and social history were all reviewed and documented in the EPIC chart.   Directed ROS with pertinent positives and negatives documented in the history of present illness/assessment and plan.  Exam:  Vitals:   10/19/21 1421  BP: 112/70   General appearance:  Normal  Abdomen: ***  Gynecologic exam: ***   Assessment/Plan:  71 y.o. G2P2002   1. Vulvar pain  - Colposcopy; Future  2. VIN III (vulvar intraepithelial neoplasia III) H/O VIN III.  Wide vulvar excision 03/2019 VIN 3/CIS with 1 margin positive.  Dr Denman George Left Post Vulva and Perianal Excision in 06/2019.  - Colposcopy; Future   [name] MD, 2:50 PM 10/19/2021

## 2021-10-20 ENCOUNTER — Ambulatory Visit
Admission: RE | Admit: 2021-10-20 | Discharge: 2021-10-20 | Disposition: A | Discharge status: Home / Self Care | Payer: Medicare Other | Source: Ambulatory Visit | Attending: Nurse Practitioner | Admitting: Nurse Practitioner

## 2021-10-20 DIAGNOSIS — N644 Mastodynia: Secondary | ICD-10-CM | POA: Diagnosis not present

## 2021-10-25 ENCOUNTER — Other Ambulatory Visit: Payer: Self-pay | Admitting: Family Medicine

## 2021-10-25 DIAGNOSIS — G2581 Restless legs syndrome: Secondary | ICD-10-CM

## 2021-10-28 ENCOUNTER — Ambulatory Visit (INDEPENDENT_AMBULATORY_CARE_PROVIDER_SITE_OTHER): Payer: Medicare Other | Admitting: Obstetrics & Gynecology

## 2021-10-28 ENCOUNTER — Encounter: Payer: Self-pay | Admitting: Obstetrics & Gynecology

## 2021-10-28 DIAGNOSIS — R102 Pelvic and perineal pain: Secondary | ICD-10-CM | POA: Diagnosis not present

## 2021-10-28 DIAGNOSIS — D071 Carcinoma in situ of vulva: Secondary | ICD-10-CM

## 2021-10-28 NOTE — Progress Notes (Unsigned)
    Amanda [name] 1950-09-10 [id number]        71 y.o.  G2P2002   RP: Vulvar pain/H/O VIN 3 for Vulvar Colposcopy  HPI: ***   OB History  Gravida Para Term Preterm AB Living  2 2 2     2   SAB IAB Ectopic Multiple Live Births               # Outcome Date GA Lbr Len/2nd Weight Sex Delivery Anes PTL Lv  2 Term           1 Term             Past medical history,surgical history, problem list, medications, allergies, family history and social history were all reviewed and documented in the EPIC chart.   Directed ROS with pertinent positives and negatives documented in the history of present illness/assessment and plan.  Exam:  Vitals:   10/28/21 1356  BP: 114/78   General appearance:  Normal   Colposcopy Procedure Note SEIDY LABRECK 10/28/2021  Indications: Vulva pain/h/o VIN 3  Procedure Details  The risks and benefits of the procedure and Written informed consent obtained.  The vulva is swabbed x 3 with acetic acid solution.  Findings:  Vulvar colposcopy: Physical Exam Genitourinary:      Perirectal colposcopy: Normal  The cervix was sprayed with Hurricane before performing the cervical biopsies.  Specimens: None  Complications: None . Plan:  F/U Gynecologic health exam   Assessment/Plan:  71 y.o. G2P2002   1. Vulvar pain  - Colposcopy  2. VIN III (vulvar intraepithelial neoplasia III)  - Colposcopy   [name] MD, 2:18 PM 10/28/2021

## 2021-11-01 ENCOUNTER — Encounter: Payer: Self-pay | Admitting: Obstetrics & Gynecology

## 2021-11-05 ENCOUNTER — Other Ambulatory Visit: Payer: Self-pay | Admitting: Family Medicine

## 2021-11-05 MED ORDER — CLONAZEPAM 0.5 MG PO TABS: 0.5000 mg | ORAL_TABLET | Freq: Every day | ORAL | 1 refills | Status: DC

## 2021-11-05 NOTE — Telephone Encounter (Signed)
LAST APPOINTMENT DATE:   10/15/21 OV with AK 01/20/21 OV with CA 10/29/20 Same Day with CA 08/24/20 OV with PCP   NEXT APPOINTMENT DATE: 02/28/22 CPE   MEDICATION: clonazePAM (KLONOPIN) 0.5 MG tablet [[id number]]    Is the patient out of medication?  Has a few days left (approx. 4-5)   PHARMACY: [id number] [place] [place] [address] [id number] Phone:  [phone] Fax:  [phone]

## 2021-11-17 ENCOUNTER — Other Ambulatory Visit: Payer: Self-pay | Admitting: Family Medicine

## 2021-11-17 DIAGNOSIS — Z1231 Encounter for screening mammogram for malignant neoplasm of breast: Secondary | ICD-10-CM

## 2021-11-24 DIAGNOSIS — H18513 Endothelial corneal dystrophy, bilateral: Secondary | ICD-10-CM | POA: Diagnosis not present

## 2021-11-24 DIAGNOSIS — H26493 Other secondary cataract, bilateral: Secondary | ICD-10-CM | POA: Diagnosis not present

## 2021-11-24 DIAGNOSIS — H18523 Epithelial (juvenile) corneal dystrophy, bilateral: Secondary | ICD-10-CM | POA: Diagnosis not present

## 2021-11-24 DIAGNOSIS — Z961 Presence of intraocular lens: Secondary | ICD-10-CM | POA: Diagnosis not present

## 2021-11-24 DIAGNOSIS — H04123 Dry eye syndrome of bilateral lacrimal glands: Secondary | ICD-10-CM | POA: Diagnosis not present

## 2021-12-02 DIAGNOSIS — R490 Dysphonia: Secondary | ICD-10-CM | POA: Diagnosis not present

## 2021-12-08 ENCOUNTER — Encounter: Payer: Self-pay | Admitting: Family Medicine

## 2021-12-08 ENCOUNTER — Ambulatory Visit (INDEPENDENT_AMBULATORY_CARE_PROVIDER_SITE_OTHER)
Admission: RE | Admit: 2021-12-08 | Discharge: 2021-12-08 | Disposition: A | Discharge status: Home / Self Care | Payer: Medicare Other | Source: Ambulatory Visit | Attending: Family Medicine | Admitting: Family Medicine

## 2021-12-08 ENCOUNTER — Ambulatory Visit (INDEPENDENT_AMBULATORY_CARE_PROVIDER_SITE_OTHER): Payer: Medicare Other | Admitting: Family Medicine

## 2021-12-08 VITALS — BP 124/80 | HR 60 | Temp 98.4°F | Ht 65.5 in | Wt 152.2 lb

## 2021-12-08 DIAGNOSIS — R5383 Other fatigue: Secondary | ICD-10-CM

## 2021-12-08 DIAGNOSIS — S42002A Fracture of unspecified part of left clavicle, initial encounter for closed fracture: Secondary | ICD-10-CM | POA: Diagnosis not present

## 2021-12-08 DIAGNOSIS — R490 Dysphonia: Secondary | ICD-10-CM | POA: Diagnosis not present

## 2021-12-08 DIAGNOSIS — M898X1 Other specified disorders of bone, shoulder: Secondary | ICD-10-CM

## 2021-12-08 DIAGNOSIS — K219 Gastro-esophageal reflux disease without esophagitis: Secondary | ICD-10-CM | POA: Diagnosis not present

## 2021-12-08 LAB — CBC WITH DIFFERENTIAL/PLATELET
Basophils Absolute: 0 10*3/uL (ref 0.0–0.1)
Basophils Relative: 0.7 % (ref 0.0–3.0)
Eosinophils Absolute: 0.1 10*3/uL (ref 0.0–0.7)
Eosinophils Relative: 1.5 % (ref 0.0–5.0)
HCT: 39.7 % (ref 36.0–46.0)
Hemoglobin: 13.1 g/dL (ref 12.0–15.0)
Lymphocytes Relative: 20.4 % (ref 12.0–46.0)
Lymphs Abs: 0.9 10*3/uL (ref 0.7–4.0)
MCHC: 32.9 g/dL (ref 30.0–36.0)
MCV: 91.1 fl (ref 78.0–100.0)
Monocytes Absolute: 0.3 10*3/uL (ref 0.1–1.0)
Monocytes Relative: 6.7 % (ref 3.0–12.0)
Neutro Abs: 3 10*3/uL (ref 1.4–7.7)
Neutrophils Relative %: 70.7 % (ref 43.0–77.0)
Platelets: 198 10*3/uL (ref 150.0–400.0)
RBC: 4.36 Mil/uL (ref 3.87–5.11)
RDW: 13.3 % (ref 11.5–15.5)
WBC: 4.3 10*3/uL (ref 4.0–10.5)

## 2021-12-08 LAB — POC COVID19 BINAXNOW: SARS Coronavirus 2 Ag: NEGATIVE

## 2021-12-08 LAB — POCT RAPID STREP A (OFFICE): Rapid Strep A Screen: NEGATIVE

## 2021-12-08 LAB — COMPREHENSIVE METABOLIC PANEL
ALT: 17 U/L (ref 0–35)
AST: 22 U/L (ref 0–37)
Albumin: 4.8 g/dL (ref 3.5–5.2)
Alkaline Phosphatase: 68 U/L (ref 39–117)
BUN: 13 mg/dL (ref 6–23)
CO2: 30 mEq/L (ref 19–32)
Calcium: 9.8 mg/dL (ref 8.4–10.5)
Chloride: 102 mEq/L (ref 96–112)
Creatinine, Ser: 0.95 mg/dL (ref 0.40–1.20)
GFR: 60.22 mL/min (ref >60.00)
Glucose, Bld: 135 mg/dL — ABNORMAL HIGH (ref 70–99)
Potassium: 4.9 mEq/L (ref 3.5–5.1)
Sodium: 140 mEq/L (ref 135–145)
Total Bilirubin: 0.6 mg/dL (ref 0.2–1.2)
Total Protein: 7.5 g/dL (ref 6.0–8.3)

## 2021-12-08 LAB — TSH: TSH: 0.88 u[IU]/mL (ref 0.35–5.50)

## 2021-12-08 LAB — MAGNESIUM: Magnesium: 2.2 mg/dL (ref 1.5–2.5)

## 2021-12-08 NOTE — Progress Notes (Signed)
Subjective:     Patient ID: Amanda [name], female    DOB: 08-30-1950, 71 y.o.   MRN: [id number]  Chief Complaint  Patient presents with   Fatigue    Started yesterday   Gastroesophageal Reflux   Hoarse    Started 1 month ago, has seen ENT    HPI Hoarse for 1 mo-esp talking-saw ENT-laryngoscopy and told "vocal cords fine" last wk.   Fatigue started yesterday-no f/c. Felt like no energy-didn't want to do anything.  Today, a little tired but not normal.  Covid neg yest.  Not achey   GERD-sees GI in Sept. Takes enzimatica(apple cider w/mother).  PPI in past, but not like it so on the other.  GERD acting up past 1-2 mo.   In Feb-EGD w/dilation.  No dysphagia.  Occ pain on L throat-can be sitting and present.  Lasts <89mn. No other symptoms w/it.  Doesn't wake her.  No palp.    Aloe helped some.   There are no preventive care reminders to display for this patient.  Past Medical History:  Diagnosis Date   Anemia    Anxiety    Celiac disease    Chronic constipation    COVID 02/12/2021   COVID-19 virus vaccine not available    GERD (gastroesophageal reflux disease)    occasionally (per pt takes apple cider vinager)   Heart palpitations    cardiologist-- dr hEllyn Hack-- event monitor 08-24-2011 epic ;  nuclear stress test 03-16-2011 (epic) normal w/ no ischemia, ef 65%;  echo 01-18-2013  ef 60-65%, G1DD   Hiatal hernia    History of subdural hematoma    12/ 23/ 2010  s/p  craniotomy w/ hematoma evacuation (pt had a fall w/ concussion)  per pt no residual   History of thyroid cancer followed by dr rConstance Holster--   s/p  left thyroidectomy --- per pt no radiation and no recurrence   Hyperlipidemia    IBS (irritable bowel syndrome)    Immunization, single disease 06/09/2019   1st dose moderma vaccine administered   Insomnia    OA (osteoarthritis)    Osteopenia, T score -2.1 FRAX 9.4%/1.3% stable from prior DEXA 06/2015   Restless leg syndrome    Right thyroid nodule    followed by  dr rConstance Holster-- last ultrasound in epic 03-30-2018 stable , to bx   STD (sexually transmitted disease), HSV    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   Wears glasses     Past Surgical History:  Procedure Laterality Date   ABDOMINAL HYSTERECTOMY  1986   ovaries remain   BREAST REDUCTION SURGERY Bilateral 1999   CATARACT EXTRACTION W/ INTRAOCULAR LENS  IMPLANT, BILATERAL  2019   COLONOSCOPY  last one 11-22-2018   ESOPHAGOGASTRODUODENOSCOPY     KNEE ARTHROSCOPY Bilateral 2002   SUBDURAL HEMATOMA EVACUATION VIA CRANIOTOMY  04-30-2009  @[facility]    left frontotemporapartietal    THYROID LOBECTOMY Left 05-04-2001  @[facility]   dr rConstance Holster  TONSILLECTOMY     VULVECTOMY N/A 03/12/2019   Procedure: WIDE LOCAL EXCISION OF CARCINOMA IS SITU OF VULVA;  Surgeon: FAnastasio Auerbach MD;  Location: [facility]  Service: Gynecology;  Laterality: N/A;  request to follow in [place] time requests one hour colposcope available in OR with acetic acid.   VULVECTOMY N/A 06/17/2019   Procedure: WIDE LOCAL  EXCISION VULVECTOMY;  Surgeon: REveritt Amber MD;  [facility]  Service: Gynecology;  Laterality: N/A;    Outpatient  Medications Prior to Visit  Medication Sig Dispense Refill   bisacodyl (DULCOLAX) 5 MG EC tablet Take 5 mg by mouth daily as needed for moderate constipation.     CALCIUM PO Take by mouth. Takes 2     cholecalciferol (VITAMIN D3) 25 MCG (1000 UT) tablet Take 1,000 Units by mouth daily.     clonazePAM (KLONOPIN) 1 MG tablet TAKE 1 TABLET (1 MG TOTAL) BY MOUTH AT BEDTIME AS NEEDED (SLEEP). FOR ANXIETY     CO ENZYME Q-10 PO Take by mouth daily.      Cyanocobalamin (VITAMIN B-12 ER PO) Take 5,000 mcg by mouth daily.     escitalopram (LEXAPRO) 10 MG tablet Take 10 mg by mouth daily.     Melatonin 3 MG CAPS Take by mouth at bedtime as needed.      metoprolol succinate (TOPROL-XL) 25 MG 24 hr tablet Take 1.5 tablets daily. May take a half tablet  as needed for palpitations 135 tablet 3   Multiple Vitamin (MULTIVITAMIN) tablet Take 1 tablet by mouth daily.     NON FORMULARY Bausch and lomb ointment-Muro-//128, 5%ointment- Each eye nightly     rOPINIRole (REQUIP) 0.25 MG tablet TAKE 1 TABLET BY MOUTH EVERYDAY AT BEDTIME 90 tablet 1   rosuvastatin (CRESTOR) 5 MG tablet TAKE 1 TABLET BY MOUTH EVERY DAY 90 tablet 3   valACYclovir (VALTREX) 500 MG tablet One tab bid as needed for outbreak 180 tablet 1   clonazePAM (KLONOPIN) 0.5 MG tablet Take 1 tablet (0.5 mg total) by mouth at bedtime. 90 tablet 1   No facility-administered medications prior to visit.    Allergies  Allergen Reactions   Ambien [Zolpidem Tartrate] Other (See Comments)    Causes Neurological problems with very bad dizziness, pains, disorientation   Cortisone Other (See Comments)    REACTION: "mania" post ESI   Eszopiclone And Related Other (See Comments)    Causes Neurological problems with very bad dizziness, pains, disorientation   Levofloxacin Other (See Comments)    Muscle spasms and jerking movements   Dicloxacillin Other (See Comments)    Stomach problems, swelling of tongue   Dilantin [Phenytoin Sodium Extended] Hives and Itching   Erythromycin Other (See Comments)    Tears stomach up   Avelox [Moxifloxacin Hcl In Nacl]     Pt unsure of reaction.   Other     Celiac disease   Nitrofuran Derivatives Other (See Comments)    unknown   Phenytoin Sodium Extended Rash   Trazodone And Nefazodone Other (See Comments)    dizzy   ROS neg/noncontributory except as noted HPI/below  Intermitt pain-sharp-horiz across R upper calf. More in fetal position and has to stretch leg out. Only when lying in bed.Going on for sev weeks. Takes Zn/Mg. Pt stretches, walks, [employer].        Objective:     BP 124/80   Pulse 60   Temp 98.4 F (36.9 C) (Temporal)   Ht 5' 5.5" (1.664 m)   Wt 152 lb 4 oz (69.1 kg)   LMP  (LMP Unknown)   SpO2 98%   BMI 24.95 kg/m  Wt Readings  from Last 3 Encounters:  12/08/21 152 lb 4 oz (69.1 kg)  10/15/21 155 lb 2 oz (70.4 kg)  09/09/21 153 lb 9.6 oz (69.7 kg)    Physical Exam   Gen: WDWN NAD HEENT: NCAT, conjunctiva not injected, sclera nonicteric TM WNL B, OP moist, no exudates  NECK:  supple, no thyromegaly, no nodes,  no carotid bruits Tender L clavicle near neck.   CARDIAC: RRR, S1S2+, no murmur. DP 2+B LUNGS: CTAB. No wheezes ABDOMEN:  BS+, soft, NTND, No HSM, no masses EXT:  no edema MSK: no gross abnormalities.  NEURO: A&O x3.  CN II-XII intact.  PSYCH: normal mood. Good eye contact  Results for orders placed or performed in visit on 12/08/21  POCT rapid strep A  Result Value Ref Range   Rapid Strep A Screen Negative Negative  POC COVID-19  Result Value Ref Range   SARS Coronavirus 2 Ag Negative Negative        Assessment & Plan:   Problem List Items Addressed This Visit       Digestive   GERD (gastroesophageal reflux disease)   Other Visit Diagnoses     Fatigue, unspecified type    -  Primary   Relevant Orders   POCT rapid strep A (Completed)   Comprehensive metabolic panel   TSH   CBC with Differential/Platelet   Magnesium   Hoarse       Relevant Orders   POCT rapid strep A (Completed)   Pain of left clavicle       Relevant Orders   DG Clavicle Left      Sore throat/hoarseness-strep neg.  Saw ENT.  Covid neg.  May be GERD-will see GI, may be clavicle thing-see below Fatigue-check cbc,cmp,tsh,mg(cramps) Pain L clavicle-prob oa.  Check x-ray-not sure if this is what is causing the soreness, but TTP. GERD-Omeprazole 27m daily.  Seeing GI next mo.   No orders of the defined types were placed in this encounter.   AWellington Hampshire MD

## 2021-12-08 NOTE — Patient Instructions (Addendum)
It was very nice to see you today!  Take prilosec daily Get x-ray done at [place] office   PLEASE NOTE:  If you had any lab tests please let us know if you have not heard back within a few days. You may see your results on MyChart before we have a chance to review them but we will give you a call once they are reviewed by [place]. If we ordered any referrals today, please let us know if you have not heard from their office within the next week.   Please try these tips to maintain a healthy lifestyle:  Eat most of your calories during the day when you are active. Eliminate processed foods including packaged sweets (pies, cakes, cookies), reduce intake of potatoes, white bread, white pasta, and white rice. Look for whole grain options, oat flour or almond flour.  Each meal should contain half fruits/vegetables, one quarter protein, and one quarter carbs (no bigger than a computer mouse).  Cut down on sweet beverages. This includes juice, soda, and sweet tea. Also watch fruit intake, though this is a healthier sweet option, it still contains natural sugar! Limit to 3 servings daily.  Drink at least 1 glass of water with each meal and aim for at least 8 glasses per day  Exercise at least 150 minutes every week.

## 2021-12-17 ENCOUNTER — Encounter: Payer: Self-pay | Admitting: Internal Medicine

## 2021-12-17 ENCOUNTER — Ambulatory Visit (INDEPENDENT_AMBULATORY_CARE_PROVIDER_SITE_OTHER): Payer: Medicare Other | Admitting: Internal Medicine

## 2021-12-17 DIAGNOSIS — A6920 Lyme disease, unspecified: Secondary | ICD-10-CM

## 2021-12-17 HISTORY — DX: Lyme disease, unspecified: A69.20

## 2021-12-17 MED ORDER — DOXYCYCLINE HYCLATE 100 MG PO TABS: 100.0000 mg | ORAL_TABLET | Freq: Two times a day (BID) | ORAL | 0 refills | Status: DC

## 2021-12-17 NOTE — Patient Instructions (Signed)
   It was a pleasure seeing you today!  Today the plan is... I am not certain but I believe that you have possibly Lyme's disease based on the shape of your rash.  It might be one of the other similar infections that are tickborne or just a basic bacterial infection that spreading out under the skin.  I believe that doxycycline will treat it and it is important to complete the full course due to the risk of not knocking it out could be serious.  I have therefore sent 14 days of medication and even if you have mild allergies to the doxycycline I encourage you to continue it and just take Claritin and Benadryl to manage the itching and the side effects of any allergy you might have to doxycycline.  Also recommend drinking full glass of water after every pill of doxycycline due to risk of pill induced esophagitis.  If you have any reaction to the medicine that is causing shortness of breath or are not able to tolerate it for some other reason or you think the rash is worse worsening despite the medicine please call our office right away.  Any serious shortness of breath should go to the ER.  Erythema migrans (Lyme disease) -     Doxycycline Hyclate; Take 1 tablet (100 mg total) by mouth 2 (two) times daily.  Dispense: 28 tablet; Refill: 0     No follow-ups on file. No follow up necessary Please contact me (Dr. Randol Kern) or Dr. Garret Reddish at  [phone] or via [place] with any questions/concerns

## 2021-12-17 NOTE — Progress Notes (Signed)
   Amanda [name] is a 71 y.o. female who presents today for an office visit.  Assessment/Plan:  Overview:   Amanda [name] was seen today for insect bite.  Erythema migrans (Lyme disease) Overview: Suspicious rash  Orders: -     Doxycycline Hyclate; Take 1 tablet (100 mg total) by mouth 2 (two) times daily.  Dispense: 28 tablet; Refill: 0    For patient I offered to printout this personalized a/p:  I am not certain but I believe that you have possibly Lyme's disease based on the shape of your rash.  It might be one of the other similar infections that are tickborne or just a basic bacterial infection that spreading out under the skin.  I believe that doxycycline will treat it and it is important to complete the full course due to the risk of not knocking it out could be serious.  I have therefore sent 14 days of medication and even if you have mild allergies to the doxycycline I encourage you to continue it and just take Claritin and Benadryl to manage the itching and the side effects of any allergy you might have to doxycycline.  Also recommend drinking full glass of water after every pill of doxycycline due to risk of pill induced esophagitis.  If you have any reaction to the medicine that is causing shortness of breath or are not able to tolerate it for some other reason or you think the rash is worse worsening despite the medicine please call our office right away.  Any serious shortness of breath should go to the ER.   Subjective:  HPI:  History taking was used to update the overview section of each addressed problem in the assessment/plan section above.  She had rash breakout after walking in park. Never saw bug bite but it itches feels and looks like bug bite.         Objective:  Physical Exam: BP 118/84 (BP Location: Left Arm)   Pulse 61   Temp 98.1 F (36.7 C) (Temporal)   Ht 5' 5.5" (1.664 m)   Wt 155 lb 6.4 oz (70.5 kg)   LMP  (LMP Unknown)   SpO2 92%   BMI 25.47 kg/m     Gen: No acute distress, resting comfortably Psych: Normal affect and thought content  Problem specific physical exam findings:  See media picture of rash  No results found for any visits on 12/17/21.           Loralee Pacas, MD 12/17/2021 4:11 PM

## 2021-12-23 ENCOUNTER — Ambulatory Visit: Payer: Medicare Other | Admitting: Obstetrics & Gynecology

## 2021-12-27 ENCOUNTER — Ambulatory Visit (INDEPENDENT_AMBULATORY_CARE_PROVIDER_SITE_OTHER): Payer: Medicare Other | Admitting: Family Medicine

## 2021-12-27 ENCOUNTER — Encounter: Payer: Self-pay | Admitting: Family Medicine

## 2021-12-27 VITALS — BP 145/83 | HR 64 | Temp 98.0°F | Ht 65.5 in | Wt 152.0 lb

## 2021-12-27 DIAGNOSIS — R059 Cough, unspecified: Secondary | ICD-10-CM

## 2021-12-27 DIAGNOSIS — K219 Gastro-esophageal reflux disease without esophagitis: Secondary | ICD-10-CM

## 2021-12-27 DIAGNOSIS — J301 Allergic rhinitis due to pollen: Secondary | ICD-10-CM

## 2021-12-27 DIAGNOSIS — R030 Elevated blood-pressure reading, without diagnosis of hypertension: Secondary | ICD-10-CM | POA: Diagnosis not present

## 2021-12-27 LAB — POC COVID19 BINAXNOW

## 2021-12-27 MED ORDER — PANTOPRAZOLE SODIUM 40 MG PO TBEC: 40.0000 mg | DELAYED_RELEASE_TABLET | Freq: Every day | ORAL | 3 refills | Status: DC

## 2021-12-27 NOTE — Patient Instructions (Signed)
It was very nice to see you today!  Please start the Protonix to help reduce your reflux.  This will help with the cough.  Let us know if not improving in the next 1 to 2 weeks.  Take care, Dr Jerline Pain  PLEASE NOTE:  If you had any lab tests please let us know if you have not heard back within a few days. You may see your results on mychart before we have a chance to review them but we will give you a call once they are reviewed by [place]. If we ordered any referrals today, please let us know if you have not heard from their office within the next week.   Please try these tips to maintain a healthy lifestyle:  Eat at least 3 REAL meals and 1-2 snacks per day.  Aim for no more than 5 hours between eating.  If you eat breakfast, please do so within one hour of getting up.   Each meal should contain half fruits/vegetables, one quarter protein, and one quarter carbs (no bigger than a computer mouse)  Cut down on sweet beverages. This includes juice, soda, and sweet tea.   Drink at least 1 glass of water with each meal and aim for at least 8 glasses per day  Exercise at least 150 minutes every week.

## 2021-12-27 NOTE — Progress Notes (Signed)
   Amanda [name] is a 71 y.o. female who presents today for an office visit.  Assessment/Plan:  New/Acute Problems: Cough No red flags.  Reassuring exam today.  Recently had course of doxycycline-doubt this is due to sinusitis or bronchitis/pneumonia.  She does have untreated GERD which is likely the main contributor at this point.  She also has an allergic neuritis which is being treated with Claritin-D.  We will start a PPI as below.  She will let us know if not improving.  Will consider chest x-ray or referral to pulmonology at that point.  Chronic Problems Addressed Today: GERD Not controlled.  We will start Protonix for a couple of weeks to see if this helps with her cough.  Allergic Rhinitis  Continue over-the-counter allergy meds.  May consider addition of Flonase if this continues to be an issue.     Subjective:  HPI:  Patient here with cough.  This has been going on for weeks. Went to ENT and had laryngoscopy about a month ago which was normal. No fevers or chills. Clear sputum production. She has chronic GERD which she is managing with home remedies. No shortness of breath or chest pain.  She has tried taking cough syrup with codiene which has helped.  No known sick contacts.  From COVID test was negative.       Objective:  Physical Exam: BP (!) 145/83   Pulse 64   Temp 98 F (36.7 C) (Temporal)   Ht 5' 5.5" (1.664 m)   Wt 152 lb (68.9 kg)   LMP  (LMP Unknown)   SpO2 96%   BMI 24.91 kg/m   Gen: No acute distress, resting comfortably CV: Regular rate and rhythm with no murmurs appreciated Pulm: Normal work of breathing, clear to auscultation bilaterally with no crackles, wheezes, or rhonchi Neuro: Grossly normal, moves all extremities Psych: Normal affect and thought content      Jaylenne Hamelin M. Jerline Pain, MD 12/27/2021 10:19 AM

## 2021-12-28 NOTE — Telephone Encounter (Signed)
Left message to return call to our office at their convenience.  

## 2021-12-30 ENCOUNTER — Encounter: Payer: Self-pay | Admitting: Family

## 2021-12-30 ENCOUNTER — Ambulatory Visit (INDEPENDENT_AMBULATORY_CARE_PROVIDER_SITE_OTHER): Payer: Medicare Other | Admitting: Family

## 2021-12-30 VITALS — BP 152/88 | HR 67 | Temp 98.4°F | Ht 65.5 in | Wt 153.4 lb

## 2021-12-30 DIAGNOSIS — J029 Acute pharyngitis, unspecified: Secondary | ICD-10-CM | POA: Diagnosis not present

## 2021-12-30 LAB — POCT RAPID STREP A (OFFICE): Rapid Strep A Screen: NEGATIVE

## 2021-12-30 LAB — POC COVID19 BINAXNOW: SARS Coronavirus 2 Ag: NEGATIVE

## 2021-12-30 NOTE — Patient Instructions (Addendum)
It was very nice to see you today!    As discussed, continue to take the Pantoprazole 31m daily for another week.  Then cut pill in half and take 223mdaily. Do not take the apple cider vinegar capsules. Continue drinking 2L of water daily, you should not need extra electrolytes unless you are outside sweating. Be careful to not drink more than 2.5 liters per day. Let your ENT or GI specialists (stretched esophagus) know that you are still having problems after trying all the above.       PLEASE NOTE:  If you had any lab tests please let us[place]ow if you have not heard back within a few days. You may see your results on MyChart before we have a chance to review them but we will give you a call once they are reviewed by us[place]oreaIf we ordered any referrals today, please let us[place]oreanow if you have not heard from their office within the next week.

## 2021-12-30 NOTE — Progress Notes (Signed)
Patient ID: Amanda [name], female    DOB: May 26, 1950, 71 y.o.   MRN: [id number]  Chief Complaint  Patient presents with   Sore Throat    Pt c/o sore throat and cough  for a couple weeks. Saw Dr. Jerline Pain on 8/21. Pt states Pantoprazole she was given did help her for acid reflux and throat problem. Pt states she feels like it is getting worse.     HPI: Sore Throat: Patient complains of sore throat. Associated symptoms include sore throat.Onset of symptoms was 4 weeks ago, unchanged since that time. She is drinking plenty of fluids. She has not had recent close exposure to someone with proven streptococcal pharyngitis. Seen by ENT end of July and told every thing ok, mild edema r/t reflux most likely (per EMR report). She used to take apple cider vinegar capsules for reflux, but then restarted Protonix by PCP for reflux which has helped reflux, but she still feels pain when swallowing food, states milk goes down fine. Started coughing up green mucus 2d ago, and reports mild cough with continued mucus since. Was taking DOXY for tick bite and took for 7 days and finished 6 days ago.    Assessment & Plan:  1. Sore throat pt having persistent sx, seen by ENT end of July, all ok, GI earlier in year for esophageal dilatation. States she has just restarted the Protonix 2 days ago. Advised pt to continue the Protonix 50m x 1 more week, then can cut in half and take 261mqd. Stop the apple cider vinegar caps. Stop adding electrolytes unless sweating outside. Drink 2L of water qd. F/U with GI or ENT if above measures are not helping. May also need thyroid scan, hx of left thyroidectomy d/t cancer.   - POCT rapid strep A - POC COVID-19   Subjective:    Outpatient Medications Prior to Visit  Medication Sig Dispense Refill   bisacodyl (DULCOLAX) 5 MG EC tablet Take 5 mg by mouth daily as needed for moderate constipation.     CALCIUM PO Take by mouth. Takes 2     cholecalciferol (VITAMIN D3) 25 MCG  (1000 UT) tablet Take 1,000 Units by mouth daily.     clonazePAM (KLONOPIN) 1 MG tablet TAKE 1 TABLET (1 MG TOTAL) BY MOUTH AT BEDTIME AS NEEDED (SLEEP). FOR ANXIETY     CO ENZYME Q-10 PO Take by mouth daily.      Cyanocobalamin (VITAMIN B-12 ER PO) Take 5,000 mcg by mouth daily.     escitalopram (LEXAPRO) 10 MG tablet Take 10 mg by mouth daily.     Melatonin 3 MG CAPS Take by mouth at bedtime as needed.      metoprolol succinate (TOPROL-XL) 25 MG 24 hr tablet Take 1.5 tablets daily. May take a half tablet as needed for palpitations 135 tablet 3   Multiple Vitamin (MULTIVITAMIN) tablet Take 1 tablet by mouth daily.     NON FORMULARY Bausch and lomb ointment-Muro-//128, 5%ointment- Each eye nightly     pantoprazole (PROTONIX) 40 MG tablet Take 1 tablet (40 mg total) by mouth daily. 30 tablet 3   rOPINIRole (REQUIP) 0.25 MG tablet TAKE 1 TABLET BY MOUTH EVERYDAY AT BEDTIME 90 tablet 1   rosuvastatin (CRESTOR) 5 MG tablet TAKE 1 TABLET BY MOUTH EVERY DAY 90 tablet 3   valACYclovir (VALTREX) 500 MG tablet One tab bid as needed for outbreak 180 tablet 1   No facility-administered medications prior to visit.   Past Medical History:  Diagnosis Date   Anemia    Anxiety    Celiac disease    Chronic constipation    COVID 02/12/2021   COVID-19 virus vaccine not available    Erythema migrans (Lyme disease) 12/17/2021   GERD (gastroesophageal reflux disease)    occasionally (per pt takes apple cider vinager)   Heart palpitations    cardiologist-- dr Ellyn Hack--- event monitor 08-24-2011 epic ;  nuclear stress test 03-16-2011 (epic) normal w/ no ischemia, ef 65%;  echo 01-18-2013  ef 60-65%, G1DD   Hiatal hernia    History of subdural hematoma    12/ 23/ 2010  s/p  craniotomy w/ hematoma evacuation (pt had a fall w/ concussion)  per pt no residual   History of thyroid cancer followed by dr Constance Holster---   s/p  left thyroidectomy --- per pt no radiation and no recurrence   Hyperlipidemia    IBS  (irritable bowel syndrome)    Immunization, single disease 06/09/2019   1st dose moderma vaccine administered   Insomnia    OA (osteoarthritis)    Osteopenia, T score -2.1 FRAX 9.4%/1.3% stable from prior DEXA 06/2015   Restless leg syndrome    Right thyroid nodule    followed by dr Constance Holster--- last ultrasound in epic 03-30-2018 stable , to bx   STD (sexually transmitted disease), HSV    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   Wears glasses    Past Surgical History:  Procedure Laterality Date   ABDOMINAL HYSTERECTOMY  1986   ovaries remain   BREAST REDUCTION SURGERY Bilateral 1999   CATARACT EXTRACTION W/ INTRAOCULAR LENS  IMPLANT, BILATERAL  2019   COLONOSCOPY  last one 11-22-2018   ESOPHAGOGASTRODUODENOSCOPY     KNEE ARTHROSCOPY Bilateral 2002   SUBDURAL HEMATOMA EVACUATION VIA CRANIOTOMY  04-30-2009  @[facility]    left frontotemporapartietal    THYROID LOBECTOMY Left 05-04-2001  @[facility]   dr Constance Holster   TONSILLECTOMY     VULVECTOMY N/A 03/12/2019   Procedure: WIDE LOCAL EXCISION OF CARCINOMA IS SITU OF VULVA;  Surgeon: Anastasio Auerbach, MD;  Location: [facility];  Service: Gynecology;  Laterality: N/A;  request to follow in [place] block time requests one hour colposcope available in OR with acetic acid.   VULVECTOMY N/A 06/17/2019   Procedure: WIDE LOCAL  EXCISION VULVECTOMY;  Surgeon: Everitt Amber, MD;  Location: [facility];  Service: Gynecology;  Laterality: N/A;   Allergies  Allergen Reactions   Ambien [Zolpidem Tartrate] Other (See Comments)    Causes Neurological problems with very bad dizziness, pains, disorientation   Cortisone Other (See Comments)    REACTION: "mania" post ESI   Eszopiclone And Related Other (See Comments)    Causes Neurological problems with very bad dizziness, pains, disorientation   Levofloxacin Other (See Comments)    Muscle spasms and jerking movements   Dicloxacillin Other (See Comments)    Stomach  problems, swelling of tongue   Dilantin [Phenytoin Sodium Extended] Hives and Itching   Erythromycin Other (See Comments)    Tears stomach up   Avelox [Moxifloxacin Hcl In Nacl]     Pt unsure of reaction.   Other     Celiac disease   Nitrofuran Derivatives Other (See Comments)    unknown   Phenytoin Sodium Extended Rash   Trazodone And Nefazodone Other (See Comments)    dizzy      Objective:    Physical Exam Vitals and nursing note reviewed.  Constitutional:  Appearance: Normal appearance.  HENT:     Mouth/Throat:     Mouth: Mucous membranes are moist.     Pharynx: No pharyngeal swelling, oropharyngeal exudate, posterior oropharyngeal erythema or uvula swelling.  Cardiovascular:     Rate and Rhythm: Normal rate and regular rhythm.  Pulmonary:     Effort: Pulmonary effort is normal.     Breath sounds: Normal breath sounds.  Musculoskeletal:        General: Normal range of motion.  Lymphadenopathy:     Cervical: No cervical adenopathy.  Skin:    General: Skin is warm and dry.  Neurological:     Mental Status: She is alert.  Psychiatric:        Mood and Affect: Mood normal.        Behavior: Behavior normal.    BP (!) 152/88 (BP Location: Left Arm, Patient Position: Sitting, Cuff Size: Large)   Pulse 67   Temp 98.4 F (36.9 C) (Temporal)   Ht 5' 5.5" (1.664 m)   Wt 153 lb 6.4 oz (69.6 kg)   LMP  (LMP Unknown)   SpO2 96%   BMI 25.14 kg/m  Wt Readings from Last 3 Encounters:  12/30/21 153 lb 6.4 oz (69.6 kg)  12/27/21 152 lb (68.9 kg)  12/17/21 155 lb 6.4 oz (70.5 kg)       Jeanie Sewer, NP

## 2022-01-05 ENCOUNTER — Other Ambulatory Visit: Payer: Self-pay | Admitting: *Deleted

## 2022-01-05 NOTE — Patient Outreach (Signed)
  Care Coordination   01/05/2022 Name: Amanda [name] MRN: [id number] DOB: [birth year]   Care Coordination Outreach Attempts:  An unsuccessful telephone outreach was attempted today to offer the patient information about available care coordination services as a benefit of their health plan.   Follow Up Plan:  Additional outreach attempts will be made to offer the patient care coordination information and services.   Encounter Outcome:  No Answer  Care Coordination Interventions Activated:  No   Care Coordination Interventions:  No, not indicated    Raina Mina, RN Care Management Coordinator [place] Office [phone]

## 2022-01-09 ENCOUNTER — Other Ambulatory Visit: Payer: Self-pay

## 2022-01-09 ENCOUNTER — Emergency Department (HOSPITAL_BASED_OUTPATIENT_CLINIC_OR_DEPARTMENT_OTHER): Payer: Medicare Other | Admitting: Radiology

## 2022-01-09 ENCOUNTER — Encounter (HOSPITAL_BASED_OUTPATIENT_CLINIC_OR_DEPARTMENT_OTHER): Payer: Self-pay

## 2022-01-09 ENCOUNTER — Emergency Department (HOSPITAL_BASED_OUTPATIENT_CLINIC_OR_DEPARTMENT_OTHER)
Admission: EM | Admit: 2022-01-09 | Discharge: 2022-01-09 | Disposition: A | Discharge status: Home / Self Care | Payer: Medicare Other | Attending: Emergency Medicine | Admitting: Emergency Medicine

## 2022-01-09 DIAGNOSIS — R059 Cough, unspecified: Secondary | ICD-10-CM | POA: Insufficient documentation

## 2022-01-09 DIAGNOSIS — Z20822 Contact with and (suspected) exposure to covid-19: Secondary | ICD-10-CM | POA: Insufficient documentation

## 2022-01-09 DIAGNOSIS — R052 Subacute cough: Secondary | ICD-10-CM

## 2022-01-09 DIAGNOSIS — Z79899 Other long term (current) drug therapy: Secondary | ICD-10-CM | POA: Insufficient documentation

## 2022-01-09 LAB — RESP PANEL BY RT-PCR (FLU A&B, COVID) ARPGX2
Influenza A by PCR: NEGATIVE
Influenza B by PCR: NEGATIVE
SARS Coronavirus 2 by RT PCR: NEGATIVE

## 2022-01-09 MED ORDER — HYDROCOD POLI-CHLORPHE POLI ER 10-8 MG/5ML PO SUER: 5.0000 mL | Freq: Two times a day (BID) | ORAL | 0 refills | Status: DC | PRN

## 2022-01-09 NOTE — ED Triage Notes (Signed)
Patient here POV from Home.  Endorses Cough that has been bothersome for 3-4 Weeks. Worsened Somewhat. Mildly Productive with Clear Mucous and is associated with Nasal Drainage.   No Known Fevers.   NAD Noted during Triage. A&Ox4. GCS 15. Ambulatory.

## 2022-01-09 NOTE — Discharge Instructions (Signed)
You were seen in the emergency department today due to cough.  Continue taking your Protonix as prescribed.  Follow-up with GI next week as planned.  Call your primary on Tuesday, proceed with referral to pulmonology.  Chest x-ray today was negative, additionally your COVID and flu negative.  Contact the Tussionex at night to help you sleep.  Will make you drowsy, do not take during the day.

## 2022-01-09 NOTE — ED Notes (Signed)
Pt was d/c by Harlene Salts at [facility]

## 2022-01-09 NOTE — ED Provider Notes (Signed)
[name] EM[facility] Provider Note   CSN: [id number] Arrival date & time: 01/09/22  1420     History  Chief Complaint  Patient presents with   Cough    Amanda [name] is a 71 y.o. female.  but   Cough    Patient with medical history of anemia, anxiety, celiac disease, GERD presents today due to cough.  The cough has been going on since July, she was seen by her primary a week ago and started on Protonix which is helped.  She is still having a nonproductive cough however, has a GI appointment next week that is where the cough has not improved.  She denies any chest pain, not short of breath.  She has tried Mucinex with minimal improvement.  She is not having any fevers or chills, not on an ACE inhibitor, no history of asthma or COPD.  Smoked cigarettes in her early 20s but none since then.  Home Medications Prior to Admission medications   Medication Sig Start Date End Date Taking? Authorizing Provider  bisacodyl (DULCOLAX) 5 MG EC tablet Take 5 mg by mouth daily as needed for moderate constipation.    [provider]  CALCIUM PO Take by mouth. Takes 2    [provider]  cholecalciferol (VITAMIN D3) 25 MCG (1000 UT) tablet Take 1,000 Units by mouth daily.    [provider]  clonazePAM (KLONOPIN) 1 MG tablet TAKE 1 TABLET (1 MG TOTAL) BY MOUTH AT BEDTIME AS NEEDED (SLEEP). FOR ANXIETY    [provider]  CO ENZYME Q-10 PO Take by mouth daily.     [provider]  Cyanocobalamin (VITAMIN B-12 ER PO) Take 5,000 mcg by mouth daily.    [provider]  escitalopram (LEXAPRO) 10 MG tablet Take 10 mg by mouth daily. 08/25/19   [provider]  Melatonin 3 MG CAPS Take by mouth at bedtime as needed.     [provider]  metoprolol succinate (TOPROL-XL) 25 MG 24 hr tablet Take 1.5 tablets daily. May take a half tablet as needed for palpitations 09/09/21   Almyra Deforest, [place]  Multiple Vitamin  (MULTIVITAMIN) tablet Take 1 tablet by mouth daily.    [provider]  NON FORMULARY Bausch and lomb ointment-Muro-//128, 5%ointment- Each eye nightly    [provider]  pantoprazole (PROTONIX) 40 MG tablet Take 1 tablet (40 mg total) by mouth daily. 12/27/21   Vivi Barrack, MD  rOPINIRole (REQUIP) 0.25 MG tablet TAKE 1 TABLET BY MOUTH EVERYDAY AT BEDTIME 10/25/21   Marin Olp, MD  rosuvastatin (CRESTOR) 5 MG tablet TAKE 1 TABLET BY MOUTH EVERY DAY 10/05/21   Marin Olp, MD  valACYclovir (VALTREX) 500 MG tablet One tab bid as needed for outbreak 03/26/19   Marin Olp, MD      Allergies    Ambien [zolpidem tartrate], Cortisone, Eszopiclone and related, Levofloxacin, Dicloxacillin, Dilantin [phenytoin sodium extended], Erythromycin, Avelox [moxifloxacin hcl in nacl], Other, Nitrofuran derivatives, Phenytoin sodium extended, and Trazodone and nefazodone    Review of Systems   Review of Systems  Respiratory:  Positive for cough.     Physical Exam Updated Vital Signs BP (!) 163/81 (BP Location: Right Arm)   Pulse 71   Temp 98.6 F (37 C) (Oral)   Resp 14   Ht 5' 5.5" (1.664 m)   Wt 69.6 kg   LMP  (LMP Unknown)   SpO2 97%   BMI 25.15 kg/m  Physical  Exam Vitals and nursing note reviewed. Exam conducted with a chaperone present.  Constitutional:      Appearance: Normal appearance.  HENT:     Head: Normocephalic and atraumatic.     Mouth/Throat:     Comments: Cobblestoning to posterior oropharynx Eyes:     General: No scleral icterus.       Right eye: No discharge.        Left eye: No discharge.     Extraocular Movements: Extraocular movements intact.     Pupils: Pupils are equal, round, and reactive to light.  Cardiovascular:     Rate and Rhythm: Normal rate and regular rhythm.     Pulses: Normal pulses.     Heart sounds: Normal heart sounds. No murmur heard.    No friction rub. No gallop.  Pulmonary:     Effort: Pulmonary effort is  normal. No respiratory distress.     Breath sounds: Normal breath sounds.  Abdominal:     General: Abdomen is flat. Bowel sounds are normal. There is no distension.     Palpations: Abdomen is soft.     Tenderness: There is no abdominal tenderness.  Skin:    General: Skin is warm and dry.     Coloration: Skin is not jaundiced.  Neurological:     Mental Status: She is alert. Mental status is at baseline.     Coordination: Coordination normal.     ED Results / Procedures / Treatments   Labs (all labs ordered are listed, but only abnormal results are displayed) Labs Reviewed  RESP PANEL BY RT-PCR (FLU A&B, COVID) [id number]    EKG None  Radiology DG Chest 2 View  Result Date: 01/09/2022 CLINICAL DATA:  Cough EXAM: CHEST - 2 VIEW COMPARISON:  03/25/2021 FINDINGS: Cardiac size is within normal limits. Increase in the AP diameter of chest may be a normal variation although suggest COPD. There are no signs of pulmonary edema or focal pulmonary consolidation. There is no pleural effusion or pneumothorax. There is questionable small hiatal hernia. IMPRESSION: There are no signs of pulmonary edema or focal pulmonary consolidation. Electronically Signed   By: Elmer Picker M.D.   On: 01/09/2022 15:31    Procedures Procedures    Medications Ordered in ED Medications - No data to display  ED Course/ Medical Decision Making/ A&P                           Medical Decision Making  Patient presents due to cough.  This been going on for about 6 weeks although worsened 3 weeks ago.  Differential includes pneumonia, viral infection, uncontrolled GERD, asthma, postnasal drip, although less likely metastatic process. On exam her lungs are clear to auscultation bilaterally.  She is very well-appearing, not febrile or hypoxic.  Not in any respiratory distress.  I ordered and reviewed chest x-ray which was negative for any acute process.  COVID and flu test is also negative.  I considered  additional imaging and laboratory work-up but I do think patient is stable, appropriate for outpatient follow-up.  She does not have any significant risk factors for metastatic disease, additionally given the acuity of the cough I do think it is appropriate to have her follow-up with GI next Wednesday as she is scheduled in with pulmonology.        Final Clinical Impression(s) / ED Diagnoses Final diagnoses:  None    Rx / DC Orders ED Discharge Orders  None         Sherrill Raring, Hershal Coria 01/09/22 2142    Hayden Rasmussen, MD 01/10/22 1100

## 2022-01-10 ENCOUNTER — Other Ambulatory Visit: Payer: Self-pay | Admitting: Family Medicine

## 2022-01-12 ENCOUNTER — Encounter: Payer: Self-pay | Admitting: Gastroenterology

## 2022-01-12 ENCOUNTER — Ambulatory Visit (INDEPENDENT_AMBULATORY_CARE_PROVIDER_SITE_OTHER): Payer: Medicare Other | Admitting: Gastroenterology

## 2022-01-12 VITALS — BP 136/90 | HR 64 | Ht 65.0 in | Wt 152.0 lb

## 2022-01-12 DIAGNOSIS — K219 Gastro-esophageal reflux disease without esophagitis: Secondary | ICD-10-CM

## 2022-01-12 DIAGNOSIS — R490 Dysphonia: Secondary | ICD-10-CM

## 2022-01-12 DIAGNOSIS — R059 Cough, unspecified: Secondary | ICD-10-CM

## 2022-01-12 MED ORDER — SUCRALFATE 1 GM/10ML PO SUSP: 1.0000 g | Freq: Four times a day (QID) | ORAL | 1 refills | Status: DC | PRN

## 2022-01-12 MED ORDER — PANTOPRAZOLE SODIUM 40 MG PO TBEC: 40.0000 mg | DELAYED_RELEASE_TABLET | Freq: Two times a day (BID) | ORAL | 0 refills | Status: DC

## 2022-01-12 NOTE — Progress Notes (Signed)
HPI :  71 year old female here for a follow-up visit for GERD, chronic cough, she also has a history of dysphagia, celiac disease.  I last saw the patient in February 2023.  We performed an EGD at that time for symptoms of dysphagia.  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  She underwent savory dilation to 18 mm which showed an appropriate mucosal rent.  She states the EGD resolved her dysphagia and that is no longer bothering her.  The issue she is more so having now is persistent cough, throat irritation, and question if related to GERD.  She states she has had symptoms of reflux longstanding.  Historically she has had pyrosis or epigastric burning as her main reflux symptoms.  She had been using apple cider vinegar for this over time but it stopped working a few months ago.  She has had some persistent symptoms in her chest.  She had remotely been on Prilosec years ago.  She was resumed on pantoprazole 40 mg daily.  She states this resolved her heartburn and reflux symptoms that she typically has, however she has had a persistent cough and irritated throat since that time which has persisted.  She has violent coughing episodes at times which cause rib pain and leads to headache.  She states she was seen by ENT and had a laryngoscopy who told her her vocal cords looked okay a few months ago.  She has never had PFTs.  She had a chest x-ray which did not show any acute pathology recently.  She denies any postnasal drip or allergy problems.  She has never really had a cough like this before she questions if it is related to reflux.  She denies any shortness of breath or chest pain.  She denies any overt wheezing.  She has no history of asthma.  She has been breathing in "essential oils" which she thinks has helped at times.  She also has been using cough drops.  He has tried Mucinex and Robitussin.  She was actually seen in the ED for this recently and discharged.  She does not smoke tobacco.  On her last  EGD she had no hiatal hernia, there was no erosive changes for reflux noted.  Procedural history: EGD in September 2021 by Dr. Jamey Reas through [facility].  This was performed for complaints of pharyngeal dysphagia.  I could not feel the actual report, but I can see that duodenal biopsies were taken and showed preserved villoglandular architecture without increased intraepithelial lymphocytes and active inflammation.  Gastric biopsies showed mild chronic inflammation without H. pylori.  Distal esophageal biopsies showed esophageal squamous mucosa with basal cell hyperplasia and reparative changes with no eosinophils identified.  Proximal esophageal biopsies showed the same. Colonoscopy 11/2018-- One 5 mm polyp in the cecum, removed with a cold snare. Resected and retrieved.  One 4 to 5 mm polyp in the rectum, removed with a hot snare. Resected and retrieved.  Tattoo placed.  The examination was otherwise normal.  Cecal polyp was a sessile serrated polyp and the one from the rectum was a lipoma. repeat colonoscopy in 5 years. Modified barium swallow 03/2016 - no aspiration, mild cricopharyngeal bar EGD 06/09/15 - normal esophagus, no hiatal hernia, showed mild duodenal bulb erythema but biopsies of the 2nd portion of the duodenum and bulb showed no evidence of active celiac.  Colonoscopy 06/09/15 - 2 polyps removed on colonoscopy, the largest a 1cm sessile serrated polyp in the right  colon, and another smaller adenoma. biopsies of the colon were taken and no evidence for microscopic colitis. EGD - 07/25/2011 - normal , bx negative for EoE Esophageal manometry 08/04/2010 - reportedly normal per note of Dr Sharlett Iles EGD 11/30/2009 - normal esophagus, biopsies taken to rule out EoE - path negative for reflux Colonoscopy 08/2007 - normal EGD 08/2007 - biopsies taken to rule out EoE, hiatal hernia - path negative for EoE     EGD 07/06/21: Esophagogastric landmarks were identified: the Z-line  was found at 37 cm, the gastroesophageal junction was found at 37 cm and the upper extent of the gastric folds was found at 37 cm from the incisors. Sliding 1cm hiatal hernia. - One benign-appearing, intrinsic mild stenosis was found 37 cm from the incisors. This stenosis measured less than one cm (in length). A guidewire was placed and the scope was withdrawn. Dilation was performed with a Savary dilator with mild resistance at 17 mm and 18 mm. Relook endoscopy showed an appropriate mucosal wrent at the stricture. No mucosal wrents near the proximal esophagus. - The exam of the esophagus was otherwise normal. - The entire examined stomach was normal. - The duodenal bulb and second portion of the duodenum were normal other than mild erythema of the duodenal bulb and diminutive AVM in second portion of the duodenum.      Past Medical History:  Diagnosis Date   Anemia    Anxiety    Celiac disease    Chronic constipation    COVID 02/12/2021   COVID-19 virus vaccine not available    Erythema migrans (Lyme disease) 12/17/2021   GERD (gastroesophageal reflux disease)    occasionally (per pt takes apple cider vinager)   Heart palpitations    cardiologist-- dr Ellyn Hack--- event monitor 08-24-2011 epic ;  nuclear stress test 03-16-2011 (epic) normal w/ no ischemia, ef 65%;  echo 01-18-2013  ef 60-65%, G1DD   Hiatal hernia    History of subdural hematoma    12/ 23/ 2010  s/p  craniotomy w/ hematoma evacuation (pt had a fall w/ concussion)  per pt no residual   History of thyroid cancer followed by dr Constance Holster---   s/p  left thyroidectomy --- per pt no radiation and no recurrence   Hyperlipidemia    IBS (irritable bowel syndrome)    Immunization, single disease 06/09/2019   1st dose moderma vaccine administered   Insomnia    OA (osteoarthritis)    Osteopenia, T score -2.1 FRAX 9.4%/1.3% stable from prior DEXA 06/2015   Restless leg syndrome    Right thyroid nodule    followed by dr  Constance Holster--- last ultrasound in epic 03-30-2018 stable , to bx   STD (sexually transmitted disease), HSV    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   Wears glasses      Past Surgical History:  Procedure Laterality Date   ABDOMINAL HYSTERECTOMY  1986   ovaries remain   BREAST REDUCTION SURGERY Bilateral 1999   CATARACT EXTRACTION W/ INTRAOCULAR LENS  IMPLANT, BILATERAL  2019   COLONOSCOPY  last one 11-22-2018   ESOPHAGOGASTRODUODENOSCOPY     KNEE ARTHROSCOPY Bilateral 2002   SUBDURAL HEMATOMA EVACUATION VIA CRANIOTOMY  04-30-2009  @[facility]    left frontotemporapartietal    THYROID LOBECTOMY Left 05-04-2001  @[facility]   dr Constance Holster   TONSILLECTOMY     VULVECTOMY N/A 03/12/2019   Procedure: WIDE LOCAL EXCISION OF CARCINOMA IS SITU OF VULVA;  Surgeon: Anastasio Auerbach, MD;  Location: [facility];  Service: Gynecology;  Laterality: N/A;  request to follow in [place] block time requests one hour colposcope available in OR with acetic acid.   VULVECTOMY N/A 06/17/2019   Procedure: WIDE LOCAL  EXCISION VULVECTOMY;  Surgeon: Everitt Amber, MD;  Location: [facility];  Service: Gynecology;  Laterality: N/A;   Family History  Problem Relation Age of Onset   Failure to thrive Mother        33   Liver cancer Father        alcohol related likely   Hypertension Father    Heart disease Maternal Grandmother    Heart disease Maternal Grandfather    Hyperlipidemia Maternal Grandfather    Hypertension Paternal Grandfather    Diabetes Maternal Aunt    Breast cancer Maternal Aunt 60   Colon cancer Neg Hx    Thyroid disease Neg Hx    Social History   Tobacco Use   Smoking status: Never   Smokeless tobacco: Never  Vaping Use   Vaping Use: Never used  Substance Use Topics   Alcohol use: Not Currently    Comment: 2-3 times weekly   Drug use: Never   Current Outpatient Medications  Medication Sig Dispense Refill   bisacodyl (DULCOLAX) 5 MG EC tablet Take  5 mg by mouth daily as needed for moderate constipation.     CALCIUM PO Take by mouth. Takes 2     chlorpheniramine-HYDROcodone (TUSSIONEX) 10-8 MG/5ML Take 5 mLs by mouth every 12 (twelve) hours as needed for cough. 70 mL 0   cholecalciferol (VITAMIN D3) 25 MCG (1000 UT) tablet Take 1,000 Units by mouth daily.     clonazePAM (KLONOPIN) 1 MG tablet TAKE 1 TABLET (1 MG TOTAL) BY MOUTH AT BEDTIME AS NEEDED (SLEEP). FOR ANXIETY     CO ENZYME Q-10 PO Take by mouth daily.      Cyanocobalamin (VITAMIN B-12 ER PO) Take 5,000 mcg by mouth daily.     escitalopram (LEXAPRO) 10 MG tablet Take 10 mg by mouth daily.     Melatonin 3 MG CAPS Take by mouth at bedtime as needed.      metoprolol succinate (TOPROL-XL) 25 MG 24 hr tablet Take 1.5 tablets daily. May take a half tablet as needed for palpitations 135 tablet 3   Multiple Vitamin (MULTIVITAMIN) tablet Take 1 tablet by mouth daily.     NON FORMULARY Bausch and lomb ointment-Muro-//128, 5%ointment- Each eye nightly     pantoprazole (PROTONIX) 40 MG tablet TAKE 1 TABLET BY MOUTH EVERY DAY 90 tablet 1   rOPINIRole (REQUIP) 0.25 MG tablet TAKE 1 TABLET BY MOUTH EVERYDAY AT BEDTIME 90 tablet 1   rosuvastatin (CRESTOR) 5 MG tablet TAKE 1 TABLET BY MOUTH EVERY DAY 90 tablet 3   valACYclovir (VALTREX) 500 MG tablet One tab bid as needed for outbreak 180 tablet 1   No current facility-administered medications for this visit.   Allergies  Allergen Reactions   Ambien [Zolpidem Tartrate] Other (See Comments)    Causes Neurological problems with very bad dizziness, pains, disorientation   Cortisone Other (See Comments)    REACTION: "mania" post ESI   Eszopiclone And Related Other (See Comments)    Causes Neurological problems with very bad dizziness, pains, disorientation   Levofloxacin Other (See Comments)    Muscle spasms and jerking movements   Dicloxacillin Other (See Comments)    Stomach problems, swelling of tongue   Dilantin [Phenytoin Sodium  Extended] Hives and Itching   Erythromycin  Other (See Comments)    Tears stomach up   Avelox [Moxifloxacin Hcl In Nacl]     Pt unsure of reaction.   Other     Celiac disease   Nitrofuran Derivatives Other (See Comments)    unknown   Phenytoin Sodium Extended Rash   Trazodone And Nefazodone Other (See Comments)    dizzy     Review of Systems: All systems reviewed and negative except where noted in HPI.    DG Chest 2 View  Result Date: 01/09/2022 CLINICAL DATA:  Cough EXAM: CHEST - 2 VIEW COMPARISON:  03/25/2021 FINDINGS: Cardiac size is within normal limits. Increase in the AP diameter of chest may be a normal variation although suggest COPD. There are no signs of pulmonary edema or focal pulmonary consolidation. There is no pleural effusion or pneumothorax. There is questionable small hiatal hernia. IMPRESSION: There are no signs of pulmonary edema or focal pulmonary consolidation. Electronically Signed   By: Elmer Picker M.D.   On: 01/09/2022 15:31    Lab Results  Component Value Date   WBC 4.3 12/08/2021   HGB 13.1 12/08/2021   HCT 39.7 12/08/2021   MCV 91.1 12/08/2021   PLT 198.0 12/08/2021   Lab Results  Component Value Date   CREATININE 0.95 12/08/2021   BUN 13 12/08/2021   NA 140 12/08/2021   K 4.9 12/08/2021   CL 102 12/08/2021   CO2 30 12/08/2021    Lab Results  Component Value Date   ALT 17 12/08/2021   AST 22 12/08/2021   ALKPHOS 68 12/08/2021   BILITOT 0.6 12/08/2021     Physical Exam: BP (!) 136/90   Pulse 64   Ht 5' 5"  (1.651 m)   Wt 152 lb (68.9 kg)   LMP  (LMP Unknown)   SpO2 97%   BMI 25.29 kg/m  Constitutional: Pleasant,well-developed, female in no acute distress. Cardiovascular: Normal rate, regular rhythm.  Pulmonary/chest: Effort normal and breath sounds normal. No wheezing, rales or rhonchi. Neurological: Alert and oriented to person place and time. Psychiatric: Normal mood and affect. Behavior is normal.   ASSESSMENT: 71  y.o. female here for assessment of the following  1. Cough, unspecified type   2. Hoarseness   3. Gastroesophageal reflux disease, unspecified whether esophagitis present    As above, patient with persistent cough and hoarseness in her voice over the past month and a half.  She does have a history of GERD that was poorly controlled as well, although now on Protonix 40 mg daily and her reflux symptoms have been well controlled but her cough persists.  ENT reportedly performed a laryngoscopy which did not show anything too concerning.  Her pulmonary exam is clear today.  While reflux is on the differential for her cough, this seems rather atypical for reflux to be driving her cough based on her course to date.  We can try increasing her Protonix to 40 mg twice daily for 1 month to see if that would help.  I did counsel her on long-term use of PPI, for short-term use okay to use high-dose to see if this will help, interim use lowest dose needed to control symptoms.  I will also give her some Carafate to use as needed to see if this will help anything although again I am not convinced reflux is driving the symptoms.  Given persistent cough recommend referral to pulmonary for PFTs and there evaluation.  If her cough persists on high-dose PPI, it makes reflux a  bit less likely unless she is having non acid reflux.  Most objective test to evaluate for GERD and to correlate to symptoms would be with a 24-hour pH impedance test.  We can consider that if needed if symptoms are refractory although we will hold off on that for now.  She will await her pulmonary evaluation and course on high-dose PPI.  She can contact me in 1 month for an update.   PLAN: - increase Protonix 59m BID for 1 month trial - counseled on risks of PPI, long term use lowest dose - trial of Carafate liquid 10cc every 6 hours PRN #1 bottle RF - refer to pulmonary for PFTs and evaluation - consider 24 hour pH impedance testing if symptoms  persist without clear cause  SJolly Mango MD [facility]

## 2022-01-12 NOTE — Patient Instructions (Signed)
Increase your pantoprazole 40 mg to one tablet by mouth twice daily x 1 month then decrease to once daily long-term.   We have sent the following medications to your pharmacy for you to pick up at your convenience: pantoprazole and carafate suspension.   We have sent your referral to pulmonary and they will contact you with an appt date and time.   The  GI providers would like to encourage you to use [facility] to communicate with providers for non-urgent requests or questions.  Due to long hold times on the telephone, sending your provider a message by [facility] may be a faster and more efficient way to get a response.  Please allow 48 business hours for a response.  Please remember that this is for non-urgent requests.

## 2022-01-13 ENCOUNTER — Ambulatory Visit: Payer: Medicare Other | Admitting: Obstetrics & Gynecology

## 2022-01-17 ENCOUNTER — Ambulatory Visit
Admission: RE | Admit: 2022-01-17 | Discharge: 2022-01-17 | Disposition: A | Discharge status: Home / Self Care | Payer: Medicare Other | Source: Ambulatory Visit | Attending: Family Medicine | Admitting: Family Medicine

## 2022-01-17 DIAGNOSIS — Z1231 Encounter for screening mammogram for malignant neoplasm of breast: Secondary | ICD-10-CM | POA: Diagnosis not present

## 2022-01-18 ENCOUNTER — Telehealth: Payer: Self-pay | Admitting: Family Medicine

## 2022-01-18 ENCOUNTER — Other Ambulatory Visit: Payer: Self-pay | Admitting: Internal Medicine

## 2022-01-18 DIAGNOSIS — A6920 Lyme disease, unspecified: Secondary | ICD-10-CM

## 2022-01-18 NOTE — Telephone Encounter (Signed)
Is this ok?

## 2022-01-18 NOTE — Telephone Encounter (Signed)
Yes thanks-you can call pharmacy to allow early refill

## 2022-01-18 NOTE — Telephone Encounter (Signed)
Patient states she is going out of town on Sunday 9/17.  States her Clonazepam will be due for refill on the 26th.  States she will still be out of town during that week.  Patient is requesting office to give ok to fill early to [facility] on [address].

## 2022-01-18 NOTE — Telephone Encounter (Signed)
Presume her lymes rash has not resolved completely

## 2022-01-19 ENCOUNTER — Other Ambulatory Visit: Payer: Self-pay

## 2022-01-19 NOTE — Telephone Encounter (Signed)
Spoke with patient and she informed me that she told the pharmacy doxycycline by accident but meant clonazepam.I have discontinued the doxycycline refill.

## 2022-01-19 NOTE — Telephone Encounter (Signed)
FYI, not sure what is going on with this.

## 2022-01-19 NOTE — Telephone Encounter (Signed)
Spoke with patient. This has already been resolved (doxycycline discontinued).

## 2022-01-19 NOTE — Telephone Encounter (Signed)
FYI,Called and spoke with pharmacy, early approval for Klonopin. Pharmacy states they will inform pt when Rx is ready. I spoke with Tiffany and she states the pt stated the wrong name of medication which is why doxy was sent in by the [facility] team, Tiffany states Doxy has been dc'd.

## 2022-01-31 ENCOUNTER — Telehealth: Payer: Self-pay

## 2022-01-31 NOTE — Patient Outreach (Signed)
  Care Coordination   01/31/2022 Name: Amanda [name] MRN: [id number] DOB: 1950-08-15   Care Coordination Outreach Attempts:  A second unsuccessful outreach was attempted today to offer the patient with information about available care coordination services as a benefit of their health plan.     Follow Up Plan:  Additional outreach attempts will be made to offer the patient care coordination information and services.   Encounter Outcome:  No Answer  Care Coordination Interventions Activated:  No   Care Coordination Interventions:  No, not indicated    Peter Garter RN, BSN,CCM, [place] [facility] [phone]

## 2022-02-01 ENCOUNTER — Telehealth: Payer: Self-pay

## 2022-02-01 NOTE — Patient Instructions (Signed)
Visit Information  Thank you for taking time to visit with me today. Please don't hesitate to contact me if I can be of assistance to you.   Following are the goals we discussed today:   Goals Addressed             This Visit's Progress    COMPLETED: Care Coordination Activities - no follow up required       Care Coordination Interventions: Advised patient to call to schedule Annual Wellness Visit Provided education to patient re: Annual Wellness Visit, care coordination services Assessed social determinant of health barriers          If you are experiencing a Mental Health or [place] or need someone to talk to, please call the Suicide and Crisis Lifeline: 988 call the [place] National Suicide Prevention Lifeline: 610-105-7871 or TTY: (413)682-9741 TTY 743-572-9043) to talk to a trained counselor call 1-800-273-TALK (toll free, 24 hour hotline) go to [facility] [address] [place] [phone]) call 911   Patient verbalizes understanding of instructions and care plan provided today and agrees to view in [place]. Active MyChart status and patient understanding of how to access instructions and care plan via MyChart confirmed with patient.     No further follow up required:    Peter Garter RN, [place]quline [place], [place] [facility] [phone]

## 2022-02-01 NOTE — Patient Outreach (Signed)
  Care Coordination   Initial Visit Note   02/01/2022 Name: Amanda [name] MRN: [id number] DOB: February 08, 1951  Amanda [name] is a 71 y.o. year old female who sees Amanda [name], Amanda Mars, MD for primary care. I spoke with  Amanda [name] by phone today.  What matters to the patients health and wellness today?  No concerns today     Goals Addressed             This Visit's Progress    COMPLETED: Care Coordination Activities - no follow up required       Care Coordination Interventions: Advised patient to call to schedule Annual Wellness Visit Provided education to patient re: Annual Wellness Visit, care coordination services Assessed social determinant of health barriers          SDOH assessments and interventions completed:  Yes  SDOH Interventions Today    Flowsheet Row Most Recent Value  SDOH Interventions   Food Insecurity Interventions Intervention Not Indicated  Housing Interventions Intervention Not Indicated  Transportation Interventions Intervention Not Indicated  Utilities Interventions Intervention Not Indicated  Financial Strain Interventions Intervention Not Indicated  Physical Activity Interventions Intervention Not Indicated  Stress Interventions Intervention Not Indicated        Care Coordination Interventions Activated:  Yes  Care Coordination Interventions:  Yes, provided   Follow up plan: No further intervention required.   Encounter Outcome:  Pt. Visit Completed  Peter Garter RN, BSN,CCM, CDE Care Management Coordinator [place] [facility] [phone]

## 2022-02-09 ENCOUNTER — Encounter: Payer: Self-pay | Admitting: Cardiology

## 2022-02-09 ENCOUNTER — Ambulatory Visit: Payer: Medicare Other | Attending: Cardiology | Admitting: Cardiology

## 2022-02-09 VITALS — BP 134/82 | HR 62 | Ht 65.0 in | Wt 152.4 lb

## 2022-02-09 DIAGNOSIS — R002 Palpitations: Secondary | ICD-10-CM

## 2022-02-09 DIAGNOSIS — E785 Hyperlipidemia, unspecified: Secondary | ICD-10-CM | POA: Diagnosis not present

## 2022-02-09 NOTE — Progress Notes (Signed)
Primary Care Provider: Marin Olp, [place] Cardiologist: Glenetta Hew, MD ; APP: Almyra Deforest, PA-C Electrophysiologist: None  Clinic Note: Chief Complaint  Patient presents with   Follow-up    Doing well.  Still walking 2 miles a day.  Palpitations better controlled.   ===================================  ASSESSMENT/PLAN   Problem List Items Addressed This Visit       Cardiology Problems   Hyperlipidemia with target LDL less than 100 (Chronic)    Due for labs by PCP here in October.  They have not checked in quite a while and the LDL was 80.  She is taking low-dose rosuvastatin.  He states the same level, opted to simply continue same dose.  As long as LDL stays below 100        Other   Palpitations - Primary (Chronic)    Her monitor showed several short little atrial runs, not true SVT.  She did note those episodes and also PACs and PVCs.  We have increased her dose of Toprol up to 37.5 daily and allow for additional 12.5 mg PRN worsening spells.  So far she is doing pretty well. We discussed the importance of adequate hydration, avoiding triggers such as caffeine, sweets, stress, poor sleep etc.  We also talked about vagal maneuvers and biofeedback techniques.      Relevant Orders   EKG 12-Lead (Completed)    ===================================  HPI:    Amanda [name] is a 71 y.o. female with a PMH below who presents today for 81-monthfollow-up.  Prior Cardiac Assessment: Nov 2022: TM Myoview: 11 METS - no Sx. EF 65 % - no WMA. No perfusion defects c/w INFARCT OR ISCHEMIA. Sept 2014 TTE: - EF 60-65%, Gr 1 DD.  Cor Ca++ Score - not checked.   She is a former patient of Dr. RParticia Nearingwas following her for atypical chest pain and palpitations.  I have not seen Amanda Dustsince October 2021=> admit evaluated with monitor, echo and stress test.  Stress and echo were normal.  Statin intolerant-memory issues.  Started on Zetia.  She  has been seen on multiple occasions by HAlmyra Deforest PA 09/11/2020-evaluation of palpitations.  Increased frequency of palpitations short little bursts of fast heart rates lasting a few seconds.  Not every day.  Not during exertion.  Able to walk roughly 2 miles a day.  Does not drink caffeine. Toprol increased to 50 mg = plan to reassess in ~4-6 wkx - if Sx persist with increased palpitations => consider Monitor.  01/14/2021: Continued no palpitations but the persistent episodes were rare and transient.  Lasting maybe 2 to 3 seconds oftentimes after drinking a 5-hour energy drink or with exertion. => Too much fatigue with Toprol 50, reduce to 25. => No monitor ordered - ? KIrwinif Sx recur.   She presented to the ED in November 2022 with acute onset of lightheadedness with heart palpitation after she went out to eat and had 2 sips of vodka.  EKG shows sinus rhythm with no acute arrhythmia or ischemic changes.  CT of the head was negative.  Lab work including CBC, TSH and a basic metabolic panel were normal except for borderline low potassium of 3.4.  Troponin was negative.   She was seen in December 2022 in the emergency room with right lower quadrant abdominal pain.    07/22/2021: in response to phone-in call re: dizziness and palpitations.  TSH was normal. F/u  Zio patch ordered: 6  episodes of PAT, most of her diary events occurred within 45 seconds of the these episodes.    Amanda [name] was last seen on 09/09/21 by Almyra Deforest, PA to follow-up Zio patch monitor. => Continued 37.5 mg daily of Toprol-XL.   => additional 12.5 mg as needed.   Recent Hospitalizations:  01/09/2022-ER visit: Subacute cough, sore throat.  Cough symptoms ongoing since July.  Had been started on Protonix by PCP with some help.  Still noting nonproductive cough.  Pending GI evaluation no chest pain or pressure with exercise.  Minimal improvement with Mucinex.  Distant history of smoking. -=> Chest x-ray is clear.  Thought to  be either related to asthma or postnasal drip.  Referred to GI medicine and pulmonary medicine.  Reviewed  CV studies:    The following studies were reviewed today: (if available, images/films reviewed: From Epic Chart or Care Everywhere) Zio Patch: Predominant underlying rhythm was sinus with a rate range of 43 to 126 bpm. Average 65 bpm. Rare PACs and PVCs noted. No bigeminy or trigeminy. 1 episode of "nonsustained ventricular tachycardia" (NSVT)-13 beats at a rate of 128 bpm (average 110 bpm) 8 (actually 7) Atrial Runs: Fastest 8 beats-max rate 190 bpm, longest was 6 beats with an average of 92 bpm. Symptoms are with PVCs, sinus rhythm and one of the SVT/AT (actually NSVT) episodes. Would re-categorize at least one of the episodes of atrial tachycardic runs as actually a second 5 beat NSVT run. No sustained bradycardic episodes, pauses or heart block noted Only brief, nonsustained tachycardic spells noted. No sustained ventricular or supraventricular tachycardia noted. No atrial fibrillation or flutter.  .  Interval History:   Amanda [name] follows appear today for 34-monthfollow-up doing relatively well.  She says that she still has palpitations most days, but pretty well controlled not really having the fast spells now.  Pretty well-controlled on the 1-1/2 tabs of Toprol daily.  Has not had to use any additional doses.  She still active walking 2 miles a day without any major issues. Otherwise doing pretty well.  Stable.  CV Review of Symptoms (Summary): no chest pain or dyspnea on exertion positive for - palpitations and rapid heart rate negative for - edema, orthopnea, paroxysmal nocturnal dyspnea, shortness of breath, or lightheadedness, dizziness or wooziness, syncope/near syncope or TIA/amaurosis fugax, claudication.  BP here today is high for her.  Usually at home it is much better at night.  REVIEWED OF SYSTEMS   Review of Systems  Constitutional:  Negative for  malaise/fatigue and weight loss.  Respiratory:  Positive for cough. Negative for sputum production.   Gastrointestinal:  Negative for blood in stool and melena.  Genitourinary:  Negative for hematuria.  Musculoskeletal:  Negative for joint pain.  Neurological:  Negative for dizziness.  Psychiatric/Behavioral:  Negative for memory loss. The patient is not nervous/anxious and does not have insomnia.    I have reviewed and (if needed) personally updated the patient's problem list, medications, allergies, past medical and surgical history, social and family history.   PAST MEDICAL HISTORY   Past Medical History:  Diagnosis Date   Anemia    Anxiety    Celiac disease    Chronic constipation    COVID 02/12/2021   COVID-19 virus vaccine not available    Erythema migrans (Lyme disease) 12/17/2021   GERD (gastroesophageal reflux disease)    occasionally (per pt takes apple cider vinager)   Heart palpitations    cardiologist-- dr hEllyn Hack-- event monitor  08-24-2011 epic ;  nuclear stress test 03-16-2011 (epic) normal w/ no ischemia, ef 65%;  echo 01-18-2013  ef 60-65%, G1DD   Hiatal hernia    History of subdural hematoma    12/ 23/ 2010  s/p  craniotomy w/ hematoma evacuation (pt had a fall w/ concussion)  per pt no residual   History of thyroid cancer followed by dr Constance Holster---   s/p  left thyroidectomy --- per pt no radiation and no recurrence   Hyperlipidemia    IBS (irritable bowel syndrome)    Immunization, single disease 06/09/2019   1st dose moderma vaccine administered   Insomnia    OA (osteoarthritis)    Osteopenia, T score -2.1 FRAX 9.4%/1.3% stable from prior DEXA 06/2015   Restless leg syndrome    Right thyroid nodule    followed by dr Constance Holster--- last ultrasound in epic 03-30-2018 stable , to bx   STD (sexually transmitted disease), HSV    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   Wears glasses     PAST SURGICAL HISTORY   Past Surgical History:  Procedure  Laterality Date   ABDOMINAL HYSTERECTOMY  1986   ovaries remain   BREAST REDUCTION SURGERY Bilateral 1999   CATARACT EXTRACTION W/ INTRAOCULAR LENS  IMPLANT, BILATERAL  2019   COLONOSCOPY  last one 11-22-2018   ESOPHAGOGASTRODUODENOSCOPY     KNEE ARTHROSCOPY Bilateral 2002   REDUCTION MAMMAPLASTY Bilateral    SUBDURAL HEMATOMA EVACUATION VIA CRANIOTOMY  04-30-2009  @[facility]    left frontotemporapartietal    THYROID LOBECTOMY Left 05-04-2001  @[facility]   dr Constance Holster   TONSILLECTOMY     VULVECTOMY N/A 03/12/2019   Procedure: WIDE LOCAL EXCISION OF CARCINOMA IS SITU OF VULVA;  Surgeon: Anastasio Auerbach, MD;  Location: [facility];  Service: Gynecology;  Laterality: N/A;  request to follow in [place] block time requests one hour colposcope available in OR with acetic acid.   VULVECTOMY N/A 06/17/2019   Procedure: WIDE LOCAL  EXCISION VULVECTOMY;  Surgeon: Everitt Amber, MD;  Location: [facility];  Service: Gynecology;  Laterality: N/A;   TTE 01/18/2013: EF 60 to 65%.  No RWMA.  GR 1 DD.  Normal atrial sizes.  Normal IVC size c/w normal RAP.   Immunization History  Administered Date(s) Administered   Moderna Sars-Covid-2 Vaccination 07/07/2019, 07/14/2019   PFIZER(Purple Top)SARS-COV-2 Vaccination 04/10/2020   Pneumococcal Polysaccharide-23 04/17/2018   Td 09/26/2008, 02/19/2015    MEDICATIONS/ALLERGIES   Current Meds  Medication Sig   bisacodyl (DULCOLAX) 5 MG EC tablet Take 5 mg by mouth daily as needed for moderate constipation.   CALCIUM PO Take by mouth. Takes 2   chlorpheniramine-HYDROcodone (TUSSIONEX) 10-8 MG/5ML Take 5 mLs by mouth every 12 (twelve) hours as needed for cough.   cholecalciferol (VITAMIN D3) 25 MCG (1000 UT) tablet Take 1,000 Units by mouth daily.   clonazePAM (KLONOPIN) 1 MG tablet TAKE 1 TABLET (1 MG TOTAL) BY MOUTH AT BEDTIME AS NEEDED (SLEEP). FOR ANXIETY   CO ENZYME Q-10 PO Take by mouth daily.    Cyanocobalamin (VITAMIN B-12 ER  PO) Take 5,000 mcg by mouth daily.   escitalopram (LEXAPRO) 10 MG tablet Take 10 mg by mouth daily.   Melatonin 3 MG CAPS Take by mouth at bedtime as needed.    Multiple Vitamin (MULTIVITAMIN) tablet Take 1 tablet by mouth daily.   NON FORMULARY Bausch and lomb ointment-Muro-//128, 5%ointment- Each eye nightly   pantoprazole (PROTONIX) 40 MG tablet Take 1 tablet (  40 mg total) by mouth 2 (two) times daily.   rosuvastatin (CRESTOR) 5 MG tablet TAKE 1 TABLET BY MOUTH EVERY DAY   sucralfate (CARAFATE) 1 GM/10ML suspension Take 10 mLs (1 g total) by mouth every 6 (six) hours as needed.   [DISCONTINUED] metoprolol succinate (TOPROL-XL) 25 MG 24 hr tablet Take 1.5 tablets daily. May take a half tablet as needed for palpitations   [DISCONTINUED] rOPINIRole (REQUIP) 0.25 MG tablet TAKE 1 TABLET BY MOUTH EVERYDAY AT BEDTIME   [DISCONTINUED] valACYclovir (VALTREX) 500 MG tablet One tab bid as needed for outbreak    Allergies  Allergen Reactions   Ambien [Zolpidem Tartrate] Other (See Comments)    Causes Neurological problems with very bad dizziness, pains, disorientation   Cortisone Other (See Comments)    REACTION: "mania" post ESI   Eszopiclone And Related Other (See Comments)    Causes Neurological problems with very bad dizziness, pains, disorientation   Levofloxacin Other (See Comments)    Muscle spasms and jerking movements   Dicloxacillin Other (See Comments)    Stomach problems, swelling of tongue   Dilantin [Phenytoin Sodium Extended] Hives and Itching   Erythromycin Other (See Comments)    Tears stomach up   Avelox [Moxifloxacin Hcl In Nacl]     Pt unsure of reaction.   Other     Celiac disease   Nitrofuran Derivatives Other (See Comments)    unknown   Phenytoin Sodium Extended Rash   Trazodone And Nefazodone Other (See Comments)    dizzy    SOCIAL HISTORY/FAMILY HISTORY   Reviewed in Epic:  Pertinent findings:  Social History   Tobacco Use   Smoking status: Never    Smokeless tobacco: Never  Vaping Use   Vaping Use: Never used  Substance Use Topics   Alcohol use: Not Currently    Comment: 2-3 times weekly   Drug use: Never   Social History   Social History Narrative   Married mother of 79 with 64 year old granddaughter (born in 2019) and another due in 2022. Her son Thurmond Butts is patient of Dr. Yong Channel       Lives with husband in a three story home, no issue with stairs.    Highest level of education is an associates.       She exercises regularly, leads an active lifestyle, has a healthy diet, and is very socially engaged. Self-described "HSP: highly sensitive person" and therefore tends to worry more about symptoms than others might.      Hobbies: enjoys time with friends, time on front porch with friends during covid, park daily, family time    [place] -PE, EKG, labs   Wt Readings from Last 3 Encounters:  02/09/22 152 lb 6.4 oz (69.1 kg)  01/12/22 152 lb (68.9 kg)  01/09/22 153 lb 7 oz (69.6 kg)    Physical Exam: BP 134/82   Pulse 62   Ht 5' 5"  (1.651 m)   Wt 152 lb 6.4 oz (69.1 kg)   LMP  (LMP Unknown)   SpO2 96%   BMI 25.36 kg/m  Physical Exam Vitals reviewed.  Constitutional:      General: She is not in acute distress.    Appearance: Normal appearance. She is normal weight. She is not ill-appearing or toxic-appearing.     Comments: Well-nourished, well-groomed  HENT:     Head: Normocephalic and atraumatic.  Neck:     Vascular: No carotid bruit or JVD.  Cardiovascular:     Rate and Rhythm:  No extrasystoles are present.    Chest Wall: PMI is not displaced.     Pulses: Normal pulses.  Pulmonary:     Effort: Pulmonary effort is normal. No respiratory distress.     Breath sounds: Normal breath sounds. No wheezing or rales.  Chest:     Chest wall: No tenderness.  Musculoskeletal:        General: No swelling. Normal range of motion.     Cervical back: Normal range of motion and neck supple.  Skin:    General: Skin is warm and  dry.  Neurological:     General: No focal deficit present.     Mental Status: She is alert and oriented to person, place, and time.     Motor: No weakness.     Gait: Gait normal.  Psychiatric:        Mood and Affect: Mood normal.        Behavior: Behavior normal.        Thought Content: Thought content normal.        Judgment: Judgment normal.     Adult ECG Report  Rate: 62 ;  Rhythm: normal sinus rhythm; ~ L atrial abnormality but otherwise normal axis, intervals and durations.  Narrative Interpretation: Normal  Recent Labs: Reviewed.  Due for update.  Seeing PCP on October 23. Lab Results  Component Value Date   CHOL 162 08/24/2020   HDL 64.70 08/24/2020   LDLCALC 80 08/24/2020   LDLDIRECT 139.2 03/10/2008   TRIG 89.0 08/24/2020   CHOLHDL 3 08/24/2020   Lab Results  Component Value Date   CREATININE 0.95 12/08/2021   BUN 13 12/08/2021   NA 140 12/08/2021   K 4.9 12/08/2021   CL 102 12/08/2021   CO2 30 12/08/2021      Latest Ref Rng & Units 12/08/2021   11:27 AM 04/17/2021   12:15 PM 03/25/2021   10:02 PM  CBC  WBC 4.0 - 10.5 K/uL 4.3  5.5  6.2   Hemoglobin 12.0 - 15.0 g/dL 13.1  13.1  12.5   Hematocrit 36.0 - 46.0 % 39.7  40.2  37.4   Platelets 150.0 - 400.0 K/uL 198.0  239  219     Lab Results  Component Value Date   HGBA1C 5.3 01/20/2021   Lab Results  Component Value Date   TSH 0.88 12/08/2021    ================================================== I spent a total of 13 minutes with the patient spent in direct patient consultation.  Additional time spent with chart review  / charting (studies, outside notes, etc): 18 min Total Time: 31 min  Current medicines are reviewed at length with the patient today.  (+/- concerns) n/a  Notice: This dictation was prepared with Dragon dictation along with smart phrase technology. Any transcriptional errors that result from this process are unintentional and may not be corrected upon review.  Studies Ordered:    Orders Placed This Encounter  Procedures   EKG 12-Lead   No orders of the defined types were placed in this encounter.   Patient Instructions / Medication Changes & Studies & Tests Ordered   Patient Instructions  Medication Instructions:   No changes  *If you need a refill on your cardiac medications before your next appointment, please call your pharmacy*   Lab Work:  Not needed   Testing/Procedures:  Not needed  Follow-Up: At [facility], you and your health needs are our priority.  As part of our continuing mission to provide you with exceptional heart  care, we have created designated Provider Care Teams.  These Care Teams include your primary Cardiologist (physician) and Advanced Practice Providers (APPs -  Physician Assistants and Nurse Practitioners) who all work together to provide you with the care you need, when you need it.     Your next appointment:   12 month(s)  The format for your next appointment:   In Person  Provider:   Glenetta Hew, MD        Leonie Man, MD, MS Glenetta Hew, M.D., M.S. Interventional Cardiologist  [place]  Pager # [phone] Phone # [phone] [address] [place], [place] [address]   Thank you for choosing [facility] at [facility]!!

## 2022-02-09 NOTE — Patient Instructions (Signed)

## 2022-02-10 ENCOUNTER — Institutional Professional Consult (permissible substitution): Payer: Medicare Other | Admitting: Pulmonary Disease

## 2022-02-11 ENCOUNTER — Other Ambulatory Visit: Payer: Self-pay | Admitting: Physician Assistant

## 2022-02-25 ENCOUNTER — Telehealth: Payer: Self-pay | Admitting: Family Medicine

## 2022-02-25 ENCOUNTER — Other Ambulatory Visit: Payer: Self-pay | Admitting: Family Medicine

## 2022-02-25 DIAGNOSIS — B009 Herpesviral infection, unspecified: Secondary | ICD-10-CM

## 2022-02-25 DIAGNOSIS — G2581 Restless legs syndrome: Secondary | ICD-10-CM

## 2022-02-25 MED ORDER — VALACYCLOVIR HCL 500 MG PO TABS: ORAL_TABLET | ORAL | 1 refills | Status: DC

## 2022-02-25 NOTE — Telephone Encounter (Signed)
Rx sent to pharmacy   

## 2022-02-25 NOTE — Telephone Encounter (Signed)
LAST APPOINTMENT DATE:  12/27/21-- with PCP  NEXT APPOINTMENT DATE: 02/28/22  MEDICATION:valACYclovir (VALTREX) 500 MG tablet   Is the patient out of medication? Yes  PHARMACY:  [id number] [id number]: [phone] Fax: [phone]  Patient states: - Woke up with outbreak of herpes on her lip  - Set to see grandchildren tomorrow who she doesn't see often - Will be leaving tomorrow morning - States she would like this to be sent in as soon as possible

## 2022-02-27 ENCOUNTER — Encounter: Payer: Self-pay | Admitting: Cardiology

## 2022-02-27 NOTE — Assessment & Plan Note (Signed)
Her monitor showed several short little atrial runs, not true SVT.  She did note those episodes and also PACs and PVCs.  We have increased her dose of Toprol up to 37.5 daily and allow for additional 12.5 mg PRN worsening spells.  So far she is doing pretty well. We discussed the importance of adequate hydration, avoiding triggers such as caffeine, sweets, stress, poor sleep etc.  We also talked about vagal maneuvers and biofeedback techniques.

## 2022-02-27 NOTE — Assessment & Plan Note (Signed)
Due for labs by PCP here in October.  They have not checked in quite a while and the LDL was 80.  She is taking low-dose rosuvastatin.  He states the same level, opted to simply continue same dose.  As long as LDL stays below 100

## 2022-02-28 ENCOUNTER — Encounter: Payer: Self-pay | Admitting: Family Medicine

## 2022-02-28 ENCOUNTER — Ambulatory Visit (INDEPENDENT_AMBULATORY_CARE_PROVIDER_SITE_OTHER): Payer: Medicare Other | Admitting: Family Medicine

## 2022-02-28 VITALS — BP 150/82 | HR 77 | Temp 97.9°F | Ht 65.0 in | Wt 151.2 lb

## 2022-02-28 DIAGNOSIS — E88819 Insulin resistance, unspecified: Secondary | ICD-10-CM | POA: Diagnosis not present

## 2022-02-28 DIAGNOSIS — E041 Nontoxic single thyroid nodule: Secondary | ICD-10-CM | POA: Diagnosis not present

## 2022-02-28 DIAGNOSIS — R739 Hyperglycemia, unspecified: Secondary | ICD-10-CM | POA: Diagnosis not present

## 2022-02-28 DIAGNOSIS — I10 Essential (primary) hypertension: Secondary | ICD-10-CM

## 2022-02-28 DIAGNOSIS — G2581 Restless legs syndrome: Secondary | ICD-10-CM

## 2022-02-28 DIAGNOSIS — E785 Hyperlipidemia, unspecified: Secondary | ICD-10-CM | POA: Diagnosis not present

## 2022-02-28 NOTE — Progress Notes (Signed)
Phone [phone] In person visit   Subjective:   Amanda [name] is a 71 y.o. year old very pleasant female patient who presents for/with See problem oriented charting Chief Complaint  Patient presents with   Annual Exam    Pt has hernia, states shes not hungry these days. Pt states shes going to start going to massage therapist that specializes in hernias.    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    Priority: High   History of Guillain-Barre syndrome 04/01/2018    Priority: High   Thyroid nodule 03/27/2018    Priority: High   Celiac disease 11/27/2007    Priority: High   Insomnia 07/05/2007    Priority: High   History of thyroid cancer 10/03/2006    Priority: High   Essential hypertension 02/28/2022    Priority: Medium    Dysphagia 12/23/2019    Priority: Medium    History of subdural hematoma 03/26/2019    Priority: Medium    Herpes simplex type 1 infection 11/01/2018    Priority: Medium    Hyperlipidemia with target LDL less than 100 03/30/2018    Priority: Medium    Fuchs' corneal dystrophy, 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    Priority: Medium    Insulin resistance 03/29/2018    Priority: Medium    Grade I diastolic dysfunction 86/76/7209    Priority: Medium    Dysthymia 09/26/2016    Priority: Medium    Palpitations 01/17/2013    Priority: Medium    GERD (gastroesophageal reflux disease)     Priority: Medium    Restless leg syndrome, on Requip     Priority: Medium    IBS (irritable bowel syndrome) 02/16/2009    Priority: Medium    Osteoarthritis 07/05/2007    Priority: Medium    Osteopenia 10/03/2006    Priority: Medium    Elevated blood pressure reading 03/06/2020    Priority: Low   Mastalgia, left, followed by GYN 03/29/2018    Priority: Low   History of hysterectomy 03/29/2018     Priority: Low   Presbycusis of both ears 03/10/2017    Priority: Low   Allergic rhinitis 06/25/2009    Priority: Low   Diaphragmatic hernia 02/16/2009    Priority: Low   Left carotid bruit 10/03/2006    Priority: Low   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   Pain in left knee 09/24/2019    Priority: 1.   Chronic constipation 05/18/2021    Medications- reviewed and updated Current Outpatient Medications  Medication Sig Dispense Refill   bisacodyl (DULCOLAX) 5 MG EC tablet Take 5 mg by mouth daily as needed for moderate constipation.     CALCIUM PO Take by mouth. Takes 2     cholecalciferol (VITAMIN D3) 25 MCG (1000 UT) tablet Take 1,000 Units by mouth daily.     clonazePAM (KLONOPIN) 1 MG tablet TAKE 1 TABLET (1 MG TOTAL) BY MOUTH AT BEDTIME AS NEEDED (SLEEP). FOR ANXIETY     CO ENZYME Q-10 PO Take by mouth daily.      Cyanocobalamin (VITAMIN B-12 ER PO) Take 5,000 mcg by mouth daily.     escitalopram (LEXAPRO) 10 MG tablet Take 10 mg by mouth daily.     Melatonin 3 MG CAPS Take by mouth at bedtime as needed.  metoprolol succinate (TOPROL-XL) 25 MG 24 hr tablet TAKE 1 TABLET BY MOUTH DAILY. TAKE WITH OR IMMEDIATELY FOLLOWING A MEAL. 90 tablet 3   Multiple Vitamin (MULTIVITAMIN) tablet Take 1 tablet by mouth daily.     NON FORMULARY Bausch and lomb ointment-Muro-//128, 5%ointment- Each eye nightly     pantoprazole (PROTONIX) 40 MG tablet Take 1 tablet (40 mg total) by mouth 2 (two) times daily. 60 tablet 0   rOPINIRole (REQUIP) 0.25 MG tablet TAKE 1 TABLET BY MOUTH EVERYDAY AT BEDTIME 90 tablet 1   rosuvastatin (CRESTOR) 5 MG tablet TAKE 1 TABLET BY MOUTH EVERY DAY 90 tablet 3   valACYclovir (VALTREX) 500 MG tablet One tab bid as needed for outbreak 180 tablet 1   No current facility-administered medications for this visit.     Objective:  BP (!) 150/82 (BP Location: Left Arm, Patient Position: Sitting)   Pulse 77   Temp 97.9 F (36.6 C) (Temporal)   Ht  5' 5"  (1.651 m)   Wt 151 lb 3.2 oz (68.6 kg)   LMP  (LMP Unknown)   SpO2 97%   BMI 25.16 kg/m  Gen: NAD, resting comfortably     Assessment and Plan   #hyperlipidemia-peak LDL 187 S: Medication:Rosuvastatin 5 mg daily.  Patient is concerned about cognitive side effects of statins-counseling provided Lab Results  Component Value Date   CHOL 162 08/24/2020   HDL 64.70 08/24/2020   LDLCALC 80 08/24/2020   LDLDIRECT 139.2 03/10/2008   TRIG 89.0 08/24/2020   CHOLHDL 3 08/24/2020  A/P: Lipids have been very well controlled especially in light of rather low-dose statin-rather impressive improvement.  Discussed rosuvastatin less likely to cross the blood-brain barrier and encouraged her to continue current medication  #hypertension S: medication: Metoprolol 37.5 mg-has been instructed to take 1.5 of the 25 mg tablets at last visit with Dr. Ellyn Hack -walks 2 miles most days-takes about 40 minutes Home readings #s: most recently 137/77 but can run higher BP Readings from Last 3 Encounters:  02/28/22 (!) 150/82  02/09/22 134/82  01/12/22 (!) 136/90  A/P: Poor control of blood pressure despite taking metoprolol 37.5-new diagnosis of hypertension - She is going to try to take 2 tablets of the 25 mg metoprolol extended release and update me in a week with blood pressure and heart rate.  I do think her heart rate can tolerate dose based on current reading but has had lower numbers in the past -I also think low-dose amlodipine might be a reasonable choice if she cannot tolerate higher dose   # Hyperglycemia/insulin resistance/prediabetes-peak A1c 5.8 S:  Medication: None Exercise and diet-thankful she is walking regularly-once againWalking 2 miles each day.  She tries to eat a healthy diet  Lab Results  Component Value Date   HGBA1C 5.3 01/20/2021   HGBA1C 5.8 08/24/2020   HGBA1C 5.7 (H) 02/14/2020  A/P:  hopefully stable- update A1c today. Continue current meds for now    #Esophageal  obstruction/stricture/GERD #Hernia/satiety S: Has required esophageal dilation with Dr. Oren Beckmann at [facility]. - she is on apple cidar vinegar - prior/listed on pantoprazole 40 mg -She saw Dr. Havery Moros on 01/12/2022 with plan to increase Protonix to twice daily for a month as well as trial Carafate. She has opted for apple cidar vinegar insted - Was also referred to pulmonary for PFTs BUT she cancelled this- She reports saw naturopath Davy Pique- started her on mullein- drastic improvement in cough - Also plan for 24-hour pH impedance test- cancelled -  She reported at that time persistent cough and hoarseness for at least 6 weeks despite reflux symptoms being controlled  Patient reports a sliding hiatal hernia.  She notes not as hungry as result.  She plans to see a massage therapist that specializes in hernias.  Her weight is down 5 pounds from last time I saw her in April 2022  A/P: Patient is having symptoms of early satiety while taking apple cider vinegar for her acid reflux-recommended she trial the pantoprazole that was recommended by GI-if fails to improve we can certainly have her follow-up again with GI - Discussed treatment for sliding hiatal hernia is primarily symptomatic-symptoms likely not severe enough and really not severe enough (1 cm on last check) to warrant surgical consult   #Palpitations-remains on metoprolol through cardiology-follows with Dr. Ellyn Hack currently taking 37.5 mg- was told to try 1.5 tablets last visit by Dr. Debby Freiberg noted some improvement  #History of thyroid nodule-she has seen endocrinology in the past with reassuring biopsy 04/29/2020 with Dr. Loanne Drilling at that time  -Also with history of thyroid cancer papillary with left lobe removed in 2007 -Check TSH annually   Recommended follow up: Return in about 1 month (around 03/31/2022) for followup or sooner if needed.Schedule b4 you leave. Future Appointments  Date Time Provider [place]   03/29/2022  2:20 PM Marin Olp, MD [facility]    Lab/Order associations:   ICD-10-CM   1. Essential hypertension  I10 Urinalysis, Routine w reflex microscopic    2. Hyperlipidemia with target LDL less than 100  E78.5 CBC with Differential/Platelet    Comprehensive metabolic panel    Lipid panel    3. Hyperglycemia  R73.9 HgB A1c    4. Thyroid nodule  E04.1 TSH    5. Insulin resistance  E88.819 HgB A1c      Time Spent: 57 minutes of total time ( 2:10 PM-3:07 PM) was spent on the date of the encounter performing the following actions: chart review prior to seeing the patient, obtaining history, performing a medically necessary exam, counseling on the treatment plan as well as options particularly in regards to blood pressure management and discussing potential side effects of treatments, placing orders, and documenting in our EHR.    Return precautions advised.  Garret Reddish, MD

## 2022-02-28 NOTE — Patient Instructions (Addendum)
Take 2 blood pressure pills- metoprolol 25 mg so 50 mg total and update me with heart rate and blood pressure In 1 week. Try to push your walking a little more as far as pace -I can increase your prescription if this is tolerable as far as side effects- mainly lightheadedness  or if HR too low- want to keep you at least above 55 most of the time  Please stop by lab before you go If you have mychart- we will send your results within 3 business days of [place] receiving them.  If you do not have mychart- we will call you about results within 5 business days of [place] receiving them.  *please also note that you will see labs on mychart as soon as they post. I will later go in and write notes on them- will say "notes from Dr. Yong Channel"   Recommended follow up: Return in about 1 month (around 03/31/2022) for followup or sooner if needed.Schedule b4 you leave.

## 2022-03-01 LAB — CBC WITH DIFFERENTIAL/PLATELET
Basophils Absolute: 0 10*3/uL (ref 0.0–0.1)
Basophils Relative: 0.6 % (ref 0.0–3.0)
Eosinophils Absolute: 0.1 10*3/uL (ref 0.0–0.7)
Eosinophils Relative: 1 % (ref 0.0–5.0)
HCT: 39.8 % (ref 36.0–46.0)
Hemoglobin: 13.1 g/dL (ref 12.0–15.0)
Lymphocytes Relative: 20.8 % (ref 12.0–46.0)
Lymphs Abs: 1.3 10*3/uL (ref 0.7–4.0)
MCHC: 32.8 g/dL (ref 30.0–36.0)
MCV: 92.3 fl (ref 78.0–100.0)
Monocytes Absolute: 0.3 10*3/uL (ref 0.1–1.0)
Monocytes Relative: 5.4 % (ref 3.0–12.0)
Neutro Abs: 4.6 10*3/uL (ref 1.4–7.7)
Neutrophils Relative %: 72.2 % (ref 43.0–77.0)
Platelets: 234 10*3/uL (ref 150.0–400.0)
RBC: 4.32 Mil/uL (ref 3.87–5.11)
RDW: 13.1 % (ref 11.5–15.5)
WBC: 6.3 10*3/uL (ref 4.0–10.5)

## 2022-03-01 LAB — COMPREHENSIVE METABOLIC PANEL
ALT: 14 U/L (ref 0–35)
AST: 24 U/L (ref 0–37)
Albumin: 5.1 g/dL (ref 3.5–5.2)
Alkaline Phosphatase: 74 U/L (ref 39–117)
BUN: 18 mg/dL (ref 6–23)
CO2: 28 mEq/L (ref 19–32)
Calcium: 10.1 mg/dL (ref 8.4–10.5)
Chloride: 100 mEq/L (ref 96–112)
Creatinine, Ser: 0.8 mg/dL (ref 0.40–1.20)
GFR: 73.89 mL/min (ref >60.00)
Glucose, Bld: 88 mg/dL (ref 70–99)
Potassium: 4.2 mEq/L (ref 3.5–5.1)
Sodium: 138 mEq/L (ref 135–145)
Total Bilirubin: 0.6 mg/dL (ref 0.2–1.2)
Total Protein: 7.9 g/dL (ref 6.0–8.3)

## 2022-03-01 LAB — HEMOGLOBIN A1C: Hgb A1c MFr Bld: 5.8 % (ref 4.6–6.5)

## 2022-03-01 LAB — LIPID PANEL
Cholesterol: 217 mg/dL — ABNORMAL HIGH (ref 0–200)
HDL: 86.8 mg/dL (ref >39.00)
LDL Cholesterol: 111 mg/dL — ABNORMAL HIGH (ref 0–99)
NonHDL: 130.1
Total CHOL/HDL Ratio: 2
Triglycerides: 95 mg/dL (ref 0.0–149.0)
VLDL: 19 mg/dL (ref 0.0–40.0)

## 2022-03-01 LAB — TSH: TSH: 1.75 u[IU]/mL (ref 0.35–5.50)

## 2022-03-02 LAB — URINALYSIS, ROUTINE W REFLEX MICROSCOPIC
Bilirubin Urine: NEGATIVE
Hgb urine dipstick: NEGATIVE
Ketones, ur: NEGATIVE
Leukocytes,Ua: NEGATIVE
Nitrite: NEGATIVE
Specific Gravity, Urine: <=1.005 — AB (ref 1.000–1.030)
Total Protein, Urine: NEGATIVE
Urine Glucose: NEGATIVE
Urobilinogen, UA: 0.2 (ref 0.0–1.0)
pH: 6 (ref 5.0–8.0)

## 2022-03-04 ENCOUNTER — Encounter: Payer: Self-pay | Admitting: Family Medicine

## 2022-03-07 ENCOUNTER — Other Ambulatory Visit: Payer: Self-pay

## 2022-03-07 MED ORDER — ROSUVASTATIN CALCIUM 10 MG PO TABS: 5.0000 mg | ORAL_TABLET | Freq: Every day | ORAL | 3 refills | Status: DC

## 2022-03-29 ENCOUNTER — Ambulatory Visit (INDEPENDENT_AMBULATORY_CARE_PROVIDER_SITE_OTHER): Payer: Medicare Other | Admitting: Family Medicine

## 2022-03-29 ENCOUNTER — Encounter: Payer: Self-pay | Admitting: Family Medicine

## 2022-03-29 VITALS — BP 130/80 | HR 68 | Temp 97.8°F | Ht 65.0 in | Wt 148.4 lb

## 2022-03-29 DIAGNOSIS — G2581 Restless legs syndrome: Secondary | ICD-10-CM | POA: Diagnosis not present

## 2022-03-29 DIAGNOSIS — E785 Hyperlipidemia, unspecified: Secondary | ICD-10-CM | POA: Diagnosis not present

## 2022-03-29 DIAGNOSIS — I10 Essential (primary) hypertension: Secondary | ICD-10-CM | POA: Diagnosis not present

## 2022-03-29 MED ORDER — ROPINIROLE HCL 0.5 MG PO TABS: 0.5000 mg | ORAL_TABLET | Freq: Every day | ORAL | 3 refills | Status: DC

## 2022-03-29 NOTE — Patient Instructions (Addendum)
Glad blood pressure is better! Great job on monitoring  Can call GI for follow up after discussion with Amanda [name] about the iron/constipation/lower appetite  Recommended follow up: Return in about 6 months (around 09/27/2022) for followup or sooner if needed.Schedule b4 you leave.

## 2022-03-29 NOTE — Progress Notes (Signed)
Phone [phone] In person visit   Subjective:   LILO WALLINGTON is a 71 y.o. 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    Priority: High   History of Guillain-Barre syndrome 04/01/2018    Priority: High   Thyroid nodule 03/27/2018    Priority: High   Celiac disease 11/27/2007    Priority: High   Insomnia 07/05/2007    Priority: High   History of thyroid cancer 10/03/2006    Priority: High   Essential hypertension 02/28/2022    Priority: Medium    Dysphagia 12/23/2019    Priority: Medium    History of subdural hematoma 03/26/2019    Priority: Medium    Herpes simplex type 1 infection 11/01/2018    Priority: Medium    Hyperlipidemia with target LDL less than 100 03/30/2018    Priority: Medium    Fuchs' corneal dystrophy, 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    Priority: Medium    Insulin resistance 03/29/2018    Priority: Medium    Grade I diastolic dysfunction 76/16/0737    Priority: Medium    Dysthymia 09/26/2016    Priority: Medium    Palpitations 01/17/2013    Priority: Medium    GERD (gastroesophageal reflux disease)     Priority: Medium    Restless leg syndrome, on Requip     Priority: Medium    IBS (irritable bowel syndrome) 02/16/2009    Priority: Medium    Osteoarthritis 07/05/2007    Priority: Medium    Osteopenia 10/03/2006    Priority: Medium    Elevated blood pressure reading 03/06/2020    Priority: Low   Mastalgia, left, followed by GYN 03/29/2018    Priority: Low   History of hysterectomy 03/29/2018    Priority: Low   Presbycusis of both ears 03/10/2017    Priority: Low   Allergic rhinitis 06/25/2009     Priority: Low   Diaphragmatic hernia 02/16/2009    Priority: Low   Left carotid bruit 10/03/2006    Priority: Low   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   Pain in left knee 09/24/2019    Priority: 1.   Chronic constipation 05/18/2021    Medications- reviewed and updated Current Outpatient Medications  Medication Sig Dispense Refill   bisacodyl (DULCOLAX) 5 MG EC tablet Take 5 mg by mouth daily as needed for moderate constipation.     CALCIUM PO Take by mouth. Takes 2     cholecalciferol (VITAMIN D3) 25 MCG (1000 UT) tablet Take 1,000 Units by mouth daily.     clonazePAM (KLONOPIN) 1 MG tablet TAKE 1 TABLET (1 MG TOTAL) BY MOUTH AT BEDTIME AS NEEDED (SLEEP). FOR ANXIETY     CO ENZYME Q-10 PO Take by mouth daily.      Cyanocobalamin (VITAMIN B-12 ER PO) Take 5,000 mcg by mouth daily.     escitalopram (LEXAPRO) 10 MG tablet Take 10 mg by mouth daily.     Melatonin 3 MG CAPS Take by mouth at bedtime as needed.      metoprolol succinate (TOPROL-XL) 25 MG 24 hr tablet TAKE 1 TABLET BY MOUTH DAILY. TAKE WITH OR IMMEDIATELY  FOLLOWING A MEAL. 90 tablet 3   Multiple Vitamin (MULTIVITAMIN) tablet Take 1 tablet by mouth daily.     NON FORMULARY Bausch and lomb ointment-Muro-//128, 5%ointment- Each eye nightly     pantoprazole (PROTONIX) 40 MG tablet Take 1 tablet (40 mg total) by mouth 2 (two) times daily. 60 tablet 0   rosuvastatin (CRESTOR) 10 MG tablet Take 0.5 tablets (5 mg total) by mouth daily. 90 tablet 3   valACYclovir (VALTREX) 500 MG tablet One tab bid as needed for outbreak 180 tablet 1   rOPINIRole (REQUIP) 0.5 MG tablet Take 1 tablet (0.5 mg total) by mouth at bedtime. 90 tablet 3   No current facility-administered medications for this visit.     Objective:  BP 130/80 Comment: most recent home reading this morning  Pulse 68   Temp 97.8 F (36.6 C)   Ht 5' 5"  (1.651 m)   Wt 148 lb 6.4 oz (67.3 kg)   LMP  (LMP Unknown)   SpO2 98%   BMI 24.70 kg/m  Gen:  NAD, resting comfortably    Assessment and Plan   #hypertension with white coat element -new diagnosis 02/28/2022 S: medication: Metoprolol 50 mg extended release trial 02/28/2022 (on at baseline for palpitations) -box breathing method helpful.  Home readings #s: 130/80 before coming to office. Typically right around that reading at home BP Readings from Last 3 Encounters:  03/29/22 (!) 150/80. Her cuff got 150/80.   She repeated with arm at heart level on her cuff and got 141/80 and I got 142/80- very close  02/28/22 (!) 150/82  02/09/22 134/82  A/P: blood pressure much improved today- inputting her value from this am in our readings -continue current medicine   #hyperlipidemia-peak LDL of 187 S: Medication:Rosuvastatin 5 mg--> 10 mg after last visit Lab Results  Component Value Date   CHOL 217 (H) 02/28/2022   HDL 86.80 02/28/2022   LDLCALC 111 (H) 02/28/2022   LDLDIRECT 139.2 03/10/2008   TRIG 95.0 02/28/2022   CHOLHDL 2 02/28/2022   A/P: suspect improving- too soon for repeat- consider repeat next visit/bloodwork  #weight loss/decreased appetite- has lost some weight from peak of 155 in June- has to take dulcolax and still gets constipation. Down 3 lbs in a month.  -is on apple cidar vinegar and off of PPI (cough and reflux improved per patient)- small chance this could suppress appetite but had used for years prior and no issue. . No dysphagia. Is on iron which could worsen constipation- on iron daily through [facility]- is going to chat December with her about the potential for appetite/constipation issues -she would like to call to schedule follow up with Dr. Havery Moros- last seen September and mentioned 1 month follow up.   #Restless legs S: Medication: Requip 0.25 mg daily, have recommended iron twice a week due to prior ferritin under 75- as above on daily right now.  - increased to two tablets and sleeping much better A/P: we will increase dose with improvement    -still on clonazepam for sleep  Recommended follow up: Return in about 6 months (around 09/27/2022) for followup or sooner if needed.Schedule b4 you leave.  Lab/Order associations:   ICD-10-CM   1. Essential hypertension  I10     2. Restless leg syndrome  G25.81 rOPINIRole (REQUIP) 0.5 MG tablet    3. Hyperlipidemia with target LDL less than 100  E78.5       Meds ordered this encounter  Medications   rOPINIRole (REQUIP) 0.5  MG tablet    Sig: Take 1 tablet (0.5 mg total) by mouth at bedtime.    Dispense:  90 tablet    Refill:  3    Return precautions advised.  Garret Reddish, MD

## 2022-04-20 ENCOUNTER — Other Ambulatory Visit: Payer: Self-pay | Admitting: Family Medicine

## 2022-04-26 DIAGNOSIS — M79641 Pain in right hand: Secondary | ICD-10-CM | POA: Insufficient documentation

## 2022-05-05 ENCOUNTER — Telehealth: Payer: Self-pay

## 2022-05-05 NOTE — Telephone Encounter (Signed)
Patient states hx of vulvectomy a year or two ago. Now noticing "uncomfortable feeling in vulvar area"/  I recommended OV.  Message sent to appt desk to call.

## 2022-05-12 ENCOUNTER — Encounter: Payer: Self-pay | Admitting: Physician Assistant

## 2022-05-12 ENCOUNTER — Ambulatory Visit (INDEPENDENT_AMBULATORY_CARE_PROVIDER_SITE_OTHER): Payer: Medicare Other | Admitting: Physician Assistant

## 2022-05-12 VITALS — BP 120/76 | HR 61 | Temp 97.5°F | Ht 65.0 in | Wt 150.8 lb

## 2022-05-12 DIAGNOSIS — L600 Ingrowing nail: Secondary | ICD-10-CM | POA: Diagnosis not present

## 2022-05-12 NOTE — Progress Notes (Signed)
Subjective:    Patient ID: Amanda [name], female    DOB: 1950-07-26, 72 y.o.   MRN: [id number]  Chief Complaint  Patient presents with   Ingrown Toenail    Pt has ingrown toenail on left big toe    HPI Patient is in today for left ingrown toenail. States it has been bothering her with walking the last few months. She has had this cut at nail salons before, but worried about infection. No hx diabetes. Wanting it removed today.   Past Medical History:  Diagnosis Date   Anemia    Anxiety    Celiac disease    Chronic constipation    COVID 02/12/2021   COVID-19 virus vaccine not available    Erythema migrans (Lyme disease) 12/17/2021   GERD (gastroesophageal reflux disease)    occasionally (per pt takes apple cider vinager)   Heart palpitations    cardiologist-- dr Ellyn Hack--- event monitor 08-24-2011 epic ;  nuclear stress test 03-16-2011 (epic) normal w/ no ischemia, ef 65%;  echo 01-18-2013  ef 60-65%, G1DD   Hiatal hernia    History of subdural hematoma    12/ 23/ 2010  s/p  craniotomy w/ hematoma evacuation (pt had a fall w/ concussion)  per pt no residual   History of thyroid cancer followed by dr Constance Holster---   s/p  left thyroidectomy --- per pt no radiation and no recurrence   Hyperlipidemia    IBS (irritable bowel syndrome)    Immunization, single disease 06/09/2019   1st dose moderma vaccine administered   Insomnia    OA (osteoarthritis)    Osteopenia, T score -2.1 FRAX 9.4%/1.3% stable from prior DEXA 06/2015   Restless leg syndrome    Right thyroid nodule    followed by dr Constance Holster--- last ultrasound in epic 03-30-2018 stable , to bx   STD (sexually transmitted disease), HSV    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   Wears glasses     Past Surgical History:  Procedure Laterality Date   ABDOMINAL HYSTERECTOMY  1986   ovaries remain   BREAST REDUCTION SURGERY Bilateral 1999   CATARACT EXTRACTION W/ INTRAOCULAR LENS  IMPLANT, BILATERAL  2019    COLONOSCOPY  last one 11-22-2018   ESOPHAGOGASTRODUODENOSCOPY     KNEE ARTHROSCOPY Bilateral 2002   REDUCTION MAMMAPLASTY Bilateral    SUBDURAL HEMATOMA EVACUATION VIA CRANIOTOMY  04-30-2009  '@[facility]'$    left frontotemporapartietal    THYROID LOBECTOMY Left 05-04-2001  '@[facility]'$   dr Constance Holster   TONSILLECTOMY     VULVECTOMY N/A 03/12/2019   Procedure: WIDE LOCAL EXCISION OF CARCINOMA IS SITU OF VULVA;  Surgeon: Anastasio Auerbach, MD;  Location: [place];  Service: Gynecology;  Laterality: N/A;  request to follow in [place] block time requests one hour colposcope available in OR with acetic acid.   VULVECTOMY N/A 06/17/2019   Procedure: WIDE LOCAL  EXCISION VULVECTOMY;  Surgeon: Everitt Amber, MD;  Location[facility];  Service: Gynecology;  Laterality: N/A;    Family History  Problem Relation Age of Onset   Failure to thrive Mother        9   Liver cancer Father        alcohol related likely   Hypertension Father    Heart disease Maternal Grandmother    Heart disease Maternal Grandfather    Hyperlipidemia Maternal Grandfather    Hypertension Paternal Grandfather    Diabetes Maternal Aunt    Breast cancer Maternal Aunt  60   Colon cancer Neg Hx    Thyroid disease Neg Hx     Social History   Tobacco Use   Smoking status: Never   Smokeless tobacco: Never  Vaping Use   Vaping Use: Never used  Substance Use Topics   Alcohol use: Not Currently    Comment: 2-3 times weekly   Drug use: Never     Allergies  Allergen Reactions   Ambien [Zolpidem Tartrate] Other (See Comments)    Causes Neurological problems with very bad dizziness, pains, disorientation   Cortisone Other (See Comments)    REACTION: "mania" post ESI   Eszopiclone And Related Other (See Comments)    Causes Neurological problems with very bad dizziness, pains, disorientation   Levofloxacin Other (See Comments)    Muscle spasms and jerking movements   Dicloxacillin Other (See  Comments)    Stomach problems, swelling of tongue   Dilantin [Phenytoin Sodium Extended] Hives and Itching   Erythromycin Other (See Comments)    Tears stomach up   Avelox [Moxifloxacin Hcl In Nacl]     Pt unsure of reaction.   Other     Celiac disease   Nitrofuran Derivatives Other (See Comments)    unknown   Phenytoin Sodium Extended Rash   Trazodone And Nefazodone Other (See Comments)    dizzy    Review of Systems NEGATIVE UNLESS OTHERWISE INDICATED IN HPI      Objective:     BP 120/76 (BP Location: Left Arm, Patient Position: Sitting)   Pulse 61   Temp (!) 97.5 F (36.4 C) (Temporal)   Ht '5\' 5"'$  (1.651 m)   Wt 150 lb 12.8 oz (68.4 kg)   LMP  (LMP Unknown)   SpO2 97%   BMI 25.09 kg/m   Wt Readings from Last 3 Encounters:  05/12/22 150 lb 12.8 oz (68.4 kg)  03/29/22 148 lb 6.4 oz (67.3 kg)  02/28/22 151 lb 3.2 oz (68.6 kg)    BP Readings from Last 3 Encounters:  05/12/22 120/76  03/29/22 130/80  02/28/22 (!) 150/82     Physical Exam Vitals and nursing note reviewed.  Musculoskeletal:     Comments: Left great toe ingrown nail without infection        Assessment & Plan:  Ingrown nail of great toe of left foot -     Ambulatory referral to Podiatry  -No infection -Patient wanting removed today; informed we did not have anyone in office available today to do this procedure, apologized for confusion / error in scheduling -Will refer to podiatry     [place], PA-C

## 2022-05-16 DIAGNOSIS — M79641 Pain in right hand: Secondary | ICD-10-CM | POA: Diagnosis not present

## 2022-05-16 DIAGNOSIS — R2231 Localized swelling, mass and lump, right upper limb: Secondary | ICD-10-CM | POA: Diagnosis not present

## 2022-05-17 ENCOUNTER — Ambulatory Visit (INDEPENDENT_AMBULATORY_CARE_PROVIDER_SITE_OTHER): Payer: Medicare Other | Admitting: Obstetrics & Gynecology

## 2022-05-17 ENCOUNTER — Encounter: Payer: Self-pay | Admitting: Obstetrics & Gynecology

## 2022-05-17 VITALS — BP 114/70 | HR 72 | Temp 98.3°F

## 2022-05-17 DIAGNOSIS — R350 Frequency of micturition: Secondary | ICD-10-CM | POA: Diagnosis not present

## 2022-05-17 DIAGNOSIS — R102 Pelvic and perineal pain: Secondary | ICD-10-CM

## 2022-05-17 NOTE — Progress Notes (Addendum)
    Amanda [name] [birth year] [id number]        72 y.o.  G2P2002   RP: Vaginal pain and urinary frequency  HPI: Vaginal pain sometimes when sitting or walking and urinary frequency.  No other UTI Sx.  No fever. Abstinent.  H/O VIN 3 s/p wide vulvar excisions with Dr Denman George.  Colpo of Vulva Negative in 10/2021.  S/P Total Hyst.   OB History  Gravida Para Term Preterm AB Living  '2 2 2     2  '$ SAB IAB Ectopic Multiple Live Births               # Outcome Date GA Lbr Len/2nd Weight Sex Delivery Anes PTL Lv  2 Term           1 Term             Past medical history,surgical history, problem list, medications, allergies, family history and social history were all reviewed and documented in the EPIC chart.   Directed ROS with pertinent positives and negatives documented in the history of present illness/assessment and plan.  Exam:  Vitals:   05/17/22 1012  BP: 114/70  Pulse: 72  Temp: 98.3 F (36.8 C)  TempSrc: Oral  SpO2: 98%   General appearance:  Normal  Gynecologic exam: Vulva normal.  Speculum:  No vaginal lesion.  No discharge or blood.  Bimanual exam:  Vagina non-tender.  No pelvic mass felt, NT.  U/A: Yellow, slightly cloudy, Pro Neg, Nitrite Neg, WBC 0-5, RBC Neg, Bacteria Few.  Pending U. Culture.   Assessment/Plan:  72 y.o. G2P2002   1. Vaginal pain Vaginal pain sometimes when sitting or walking and urinary frequency.  No other UTI Sx.  No fever. Abstinent.  H/O VIN 3 s/p wide vulvar excisions with Dr Denman George.  Colpo of Vulva Negative in 10/2021.  S/P Total Hyst.  Normal gyn exam s/p total hysterectomy.  Patient reassured.  May be experiencing muscular/nerve/joint pain in the pelvis.  Recommend evaluation and treatment with Physical Therapist.  Ortho evaluation as needed.  2. Urinary frequency U/A Yellow, slightly cloudy, Pro Neg, Nitrite Neg, WBC 0-5, RBC Neg, Bacteria Few.  Pending U. Culture.  Will wait on U. Culture to decide if treatment is indicated. -  Urinalysis,Complete w/RFL Culture   [name] MD, 10:23 AM 05/17/2022

## 2022-05-18 ENCOUNTER — Encounter: Payer: Self-pay | Admitting: Podiatry

## 2022-05-18 ENCOUNTER — Ambulatory Visit (INDEPENDENT_AMBULATORY_CARE_PROVIDER_SITE_OTHER): Payer: Medicare Other | Admitting: Podiatry

## 2022-05-18 DIAGNOSIS — L600 Ingrowing nail: Secondary | ICD-10-CM | POA: Diagnosis not present

## 2022-05-18 LAB — URINALYSIS, COMPLETE W/RFL CULTURE
Bilirubin Urine: NEGATIVE
Glucose, UA: NEGATIVE
Hgb urine dipstick: NEGATIVE
Hyaline Cast: NONE SEEN /LPF
Ketones, ur: NEGATIVE
Leukocyte Esterase: NEGATIVE
Nitrites, Initial: NEGATIVE
Protein, ur: NEGATIVE
RBC / HPF: NONE SEEN /HPF (ref 0–2)
Specific Gravity, Urine: 1.02 (ref 1.001–1.035)
pH: 6.5 (ref 5.0–8.0)

## 2022-05-18 LAB — URINE CULTURE
MICRO NUMBER:: 14407409
SPECIMEN QUALITY:: ADEQUATE

## 2022-05-18 LAB — CULTURE INDICATED

## 2022-05-18 NOTE — Progress Notes (Signed)
Subjective:   Patient ID: Amanda [name], female   DOB: 72 y.o.   MRN: [id number]   HPI Patient presents with chronic painful ingrown toenail of the left big toe that is been going on for around a year.  Patient states it has been sore and there is been some redness patient has not noticed drainage or bleeding.  Patient does not smoke and tries to be active and walks every day   Review of Systems  All other systems reviewed and are negative.       Objective:  Physical Exam Vitals and nursing note reviewed.  Constitutional:      Appearance: She is well-developed.  Pulmonary:     Effort: Pulmonary effort is normal.  Musculoskeletal:        General: Normal range of motion.  Skin:    General: Skin is warm.  Neurological:     Mental Status: She is alert.     Neurovascular status found to be intact muscle strength was found to be adequate range of motion adequate.  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  I went ahead today and I allowed her to read then signed consent form I infiltrated the left big toe 60 mg Xylocaine Marcaine mixture sterile prep done and using sterile instrumentation remove the medial border exposed matrix applied phenol 3 applications 30 seconds followed by alcohol lavage sterile dressing gave instructions on soaks and gave instructions on leaving dressing on 24 hours but take it off earlier if throbbing were to occur.  Encouraged her to call questions concerns which may arise

## 2022-05-18 NOTE — Patient Instructions (Signed)

## 2022-05-20 ENCOUNTER — Encounter: Payer: Self-pay | Admitting: Podiatry

## 2022-05-26 ENCOUNTER — Telehealth: Payer: Self-pay | Admitting: Family Medicine

## 2022-05-26 NOTE — Telephone Encounter (Signed)
Patient states: - Has been experiencing pain in right calf for a couple of weeks on and off  - Pain described as sharp especially when walking  - Recently discovered pea sized hard lump above painful area on calf   Patient has been transferred to triage.

## 2022-05-26 NOTE — Telephone Encounter (Signed)
FYI, pt seeing [name] tomorrow.

## 2022-05-26 NOTE — Telephone Encounter (Signed)
Final Disposition: See PCP within 24 hours--Pt has been scheduled with Inda Coke on 05/27/22 @ 10am.  Patient Name: Amanda [name] [facility] Gender: Female DOB: 13-May-1950 Age: 72 Y 1 M 10 D Return Phone Number: [phone] (Primary) Address: City/ State/ Zip: [place]  [address] Client [place] [facility] [place] Client Site [place] [facility] [place] Day Provider Garret Reddish- MD Contact Type Call Who Is Calling Patient / Member / Family / Caregiver Call Type Triage / Clinical Relationship To Patient Self Return Phone Number [phone] (Primary) Chief Complaint Leg Pain Reason for Call Symptomatic / Request for [place] she has pain in right calf for a couple weeks. States pain is sharp when walking and then has a pee size bump on her calf, Translation No Nurse Assessment Nurse: Rolin Barry, RN, Levada Dy Date/Time (Eastern Time): 05/26/2022 11:00:06 AM Confirm and document reason for call. If symptomatic, describe symptoms. ---Caller sates she has pain in right calf for a couple weeks. States pain is sharp when walking and then has a pee size bump on her calf. Caller states that she feels a sharp pain every now and then. Painful to the touch. No temp. Does the patient have any new or worsening symptoms? ---Yes Will a triage be completed? ---Yes Related visit to physician within the last 2 weeks? ---No Does the PT have any chronic conditions? (i.e. diabetes, asthma, this includes High risk factors for pregnancy, etc.) ---Yes List chronic conditions. ---Restless legs anxiety high cholesterol heart rate , palpitations Is this a behavioral health or substance abuse call? ---No Guidelines Guideline Title Affirmed Question Affirmed Notes Nurse Date/Time Eilene Ghazi Time) Leg Pain [1] Localized rash is very painful AND [2] no fever Deaton, RN, Levada Dy 05/26/2022 11:02:54 AM PLEASE NOTE: All  timestamps contained within this report are represented as [place] Standard Time. CONFIDENTIALTY NOTICE: This fax transmission is intended only for the addressee. It contains information that is legally privileged, confidential or otherwise protected from use or disclosure. If you are not the intended recipient, you are strictly prohibited from reviewing, disclosing, copying using or disseminating any of this information or taking any action in reliance on or regarding this information. If you have received this fax in error, please notify us immediately by telephone so that we can arrange for its return to [place]. Phone: [phone], Toll-Free: [phone], Fax: [phone] Page: 2 of 2 Call Id: [id number] [place]. Time Eilene Ghazi Time) Disposition Final User 05/26/2022 11:07:49 AM See PCP within 24 Hours Yes Deaton, RN, Levada Dy Final Disposition 05/26/2022 11:07:49 AM See PCP within 24 Hours Yes Deaton, RN, Cindee Lame Disagree/Comply Comply Caller Understands Yes PreDisposition Did not know what to do Care Advice Given Per Guideline SEE PCP WITHIN 24 HOURS: * IF OFFICE WILL BE OPEN: You need to be examined within the next 24 hours. Call your doctor (or NP/PA) when the office opens and make an appointment. * ACETAMINOPHEN - EXTRA STRENGTH TYLENOL: Take 1,000 mg (two 500 mg pills) every 6 to 8 hours as needed. Each Extra Strength Tylenol pill has 500 mg of acetaminophen. The most you should take is 6 pills a day (3,000 mg total). Note: In [place], the maximum is 8 pills a day (4,000 mg total). * IBUPROFEN (E.G., MOTRIN, ADVIL): Take 400 mg (two 200 mg pills) by mouth every 6 hours. The most you should take is 6 pills a day (1,200 mg total). CALL BACK IF: * Fever occurs * You become worse CARE  ADVICE given per Leg Pain (Adult) guideline. LOCAL COLD: * Apply ice or soak in cold water for 20 minutes every 3 or 4 hours. * This will help to reduce itching or pain. Comments User: Saverio Danker, RN Date/Time  Eilene Ghazi Time): 05/26/2022 11:03:12 AM Denies any injury to the leg. User: Saverio Danker, RN Date/Time Eilene Ghazi Time): 05/26/2022 11:11:24 AM Connected caller with Alyse Low at the [facility]. Has appt at 10:00am in the morning. Referrals REFERRED TO PCP OFFICE

## 2022-05-26 NOTE — Telephone Encounter (Signed)
I agree with need for visit

## 2022-05-27 ENCOUNTER — Ambulatory Visit (HOSPITAL_COMMUNITY)
Admission: RE | Admit: 2022-05-27 | Discharge: 2022-05-27 | Disposition: A | Discharge status: Home / Self Care | Payer: Medicare Other | Source: Ambulatory Visit | Attending: Cardiology | Admitting: Cardiology

## 2022-05-27 ENCOUNTER — Encounter: Payer: Self-pay | Admitting: Physician Assistant

## 2022-05-27 ENCOUNTER — Ambulatory Visit (INDEPENDENT_AMBULATORY_CARE_PROVIDER_SITE_OTHER): Payer: Medicare Other | Admitting: Physician Assistant

## 2022-05-27 VITALS — BP 130/80 | HR 67 | Temp 97.8°F | Ht 65.0 in | Wt 152.4 lb

## 2022-05-27 DIAGNOSIS — M79661 Pain in right lower leg: Secondary | ICD-10-CM | POA: Insufficient documentation

## 2022-05-27 DIAGNOSIS — R233 Spontaneous ecchymoses: Secondary | ICD-10-CM

## 2022-05-27 DIAGNOSIS — R739 Hyperglycemia, unspecified: Secondary | ICD-10-CM | POA: Diagnosis not present

## 2022-05-27 LAB — CBC WITH DIFFERENTIAL/PLATELET
Basophils Absolute: 0 10*3/uL (ref 0.0–0.1)
Basophils Relative: 0.7 % (ref 0.0–3.0)
Eosinophils Absolute: 0.1 10*3/uL (ref 0.0–0.7)
Eosinophils Relative: 1.5 % (ref 0.0–5.0)
HCT: 38.4 % (ref 36.0–46.0)
Hemoglobin: 12.8 g/dL (ref 12.0–15.0)
Lymphocytes Relative: 21.7 % (ref 12.0–46.0)
Lymphs Abs: 1.1 10*3/uL (ref 0.7–4.0)
MCHC: 33.5 g/dL (ref 30.0–36.0)
MCV: 91.9 fl (ref 78.0–100.0)
Monocytes Absolute: 0.3 10*3/uL (ref 0.1–1.0)
Monocytes Relative: 6.3 % (ref 3.0–12.0)
Neutro Abs: 3.4 10*3/uL (ref 1.4–7.7)
Neutrophils Relative %: 69.8 % (ref 43.0–77.0)
Platelets: 218 10*3/uL (ref 150.0–400.0)
RBC: 4.18 Mil/uL (ref 3.87–5.11)
RDW: 13.5 % (ref 11.5–15.5)
WBC: 4.8 10*3/uL (ref 4.0–10.5)

## 2022-05-27 LAB — PROTIME-INR
INR: 1 ratio (ref 0.8–1.0)
Prothrombin Time: 10.7 s (ref 9.6–13.1)

## 2022-05-27 NOTE — Progress Notes (Signed)
Amanda [name] is a 72 y.o. female here for a new problem.  History of Present Illness:   Chief Complaint  Patient presents with   calf pain    PT c/o right calf pain off and on x several weeks, found pea sized lump yesterday in calf. She took 2 Advil yesterday with some relief.    HPI  Right Calf Pain Patient is complaining of intermittent right calf pain for several weeks. She reports finding a pea sized lump yesterday. She states no new changes in life and notes that she walks 2 miles everyday with her husband. She explains that she will lie down in bed and will randomly experience sharp pain. She expresses that these episodes are occurring more frequently and they last for seconds. Patient confirms having a baker cyst behind her right knee a year or years ago. She has toke 2 Advil yesterday for her symptoms with some relief. She denies SOB, chest pain, recent travel, recent surgery, and numbness/tingling sensation in legs.     Easy bruising Patient reports that she has noticed that she has had unexplained bruises in bilateral legs for a few weeks.  She has pictures on her phone to show this.  Some of these bruises could be quite large.  They are only on her legs, denies abdominal or trauma bruises.   Hyperglycemia She is requesting that her A1c be checked today.  Unfortunately is 2 days too soon to check this.  She is not on any medication for this.   Past Medical History:  Diagnosis Date   Anemia    Anxiety    Celiac disease    Chronic constipation    COVID 02/12/2021   COVID-19 virus vaccine not available    Erythema migrans (Lyme disease) 12/17/2021   GERD (gastroesophageal reflux disease)    occasionally (per pt takes apple cider vinager)   Heart palpitations    cardiologist-- dr Ellyn Hack--- event monitor 08-24-2011 epic ;  nuclear stress test 03-16-2011 (epic) normal w/ no ischemia, ef 65%;  echo 01-18-2013  ef 60-65%, G1DD   Hiatal hernia    History of subdural  hematoma    12/ 23/ 2010  s/p  craniotomy w/ hematoma evacuation (pt had a fall w/ concussion)  per pt no residual   History of thyroid cancer followed by dr Constance Holster---   s/p  left thyroidectomy --- per pt no radiation and no recurrence   Hyperlipidemia    IBS (irritable bowel syndrome)    Immunization, single disease 06/09/2019   1st dose moderma vaccine administered   Insomnia    OA (osteoarthritis)    Osteopenia, T score -2.1 FRAX 9.4%/1.3% stable from prior DEXA 06/2015   Restless leg syndrome    Right thyroid nodule    followed by dr Constance Holster--- last ultrasound in epic 03-30-2018 stable , to bx   STD (sexually transmitted disease), HSV    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   Wears glasses      Social History   Tobacco Use   Smoking status: Never   Smokeless tobacco: Never  Vaping Use   Vaping Use: Never used  Substance Use Topics   Alcohol use: Yes    Comment: 2-3 times weekly   Drug use: Never    Past Surgical History:  Procedure Laterality Date   ABDOMINAL HYSTERECTOMY  1986   ovaries remain   BREAST REDUCTION SURGERY Bilateral 1999   CATARACT EXTRACTION W/ INTRAOCULAR LENS  IMPLANT, BILATERAL  2019  COLONOSCOPY  last one 11-22-2018   ESOPHAGOGASTRODUODENOSCOPY     KNEE ARTHROSCOPY Bilateral 2002   REDUCTION MAMMAPLASTY Bilateral    SUBDURAL HEMATOMA EVACUATION VIA CRANIOTOMY  04-30-2009  '@[facility]    left frontotemporapartietal    THYROID LOBECTOMY Left 05-04-2001  '@[facility]   dr Constance Holster   TONSILLECTOMY     VULVECTOMY N/A 03/12/2019   Procedure: WIDE LOCAL EXCISION OF CARCINOMA IS SITU OF VULVA;  Surgeon: Anastasio Auerbach, MD;  Location: [place];  Service: Gynecology;  Laterality: N/A;  request to follow in [place] block time requests one hour colposcope available in OR with acetic acid.   VULVECTOMY N/A 06/17/2019   Procedure: WIDE LOCAL  EXCISION VULVECTOMY;  Surgeon: Everitt Amber, MD;  Location: [facility];   Service: Gynecology;  Laterality: N/A;    Family History  Problem Relation Age of Onset   Failure to thrive Mother        52   Liver cancer Father        alcohol related likely   Hypertension Father    Heart disease Maternal Grandmother    Heart disease Maternal Grandfather    Hyperlipidemia Maternal Grandfather    Hypertension Paternal Grandfather    Diabetes Maternal Aunt    Breast cancer Maternal Aunt 60   Colon cancer Neg Hx    Thyroid disease Neg Hx     Allergies  Allergen Reactions   Ambien [Zolpidem Tartrate] Other (See Comments)    Causes Neurological problems with very bad dizziness, pains, disorientation   Cortisone Other (See Comments)    REACTION: "mania" post ESI   Eszopiclone And Related Other (See Comments)    Causes Neurological problems with very bad dizziness, pains, disorientation   Levofloxacin Other (See Comments)    Muscle spasms and jerking movements   Dicloxacillin Other (See Comments)    Stomach problems, swelling of tongue   Dilantin [Phenytoin Sodium Extended] Hives and Itching   Erythromycin Other (See Comments)    Tears stomach up   Avelox [Moxifloxacin Hcl In Nacl]     Pt unsure of reaction.   Other     Celiac disease   Nitrofuran Derivatives Other (See Comments)    unknown   Phenytoin Sodium Extended Rash   Trazodone And Nefazodone Other (See Comments)    dizzy    Current Medications:   Current Outpatient Medications:    bisacodyl (DULCOLAX) 5 MG EC tablet, Take 5 mg by mouth daily as needed for moderate constipation., Disp: , Rfl:    CALCIUM PO, Take by mouth. Takes 2, Disp: , Rfl:    cholecalciferol (VITAMIN D3) 25 MCG (1000 UT) tablet, Take 1,000 Units by mouth daily., Disp: , Rfl:    clonazePAM (KLONOPIN) 0.5 MG tablet, TAKE 1 TABLET BY MOUTH AT BEDTIME., Disp: 90 tablet, Rfl: 1   CO ENZYME Q-10 PO, Take by mouth daily. , Disp: , Rfl:    Cyanocobalamin (VITAMIN B-12 ER PO), Take 5,000 mcg by mouth daily., Disp: , Rfl:     escitalopram (LEXAPRO) 10 MG tablet, Take 10 mg by mouth daily., Disp: , Rfl:    Melatonin 3 MG CAPS, Take by mouth at bedtime as needed. , Disp: , Rfl:    metoprolol succinate (TOPROL-XL) 25 MG 24 hr tablet, TAKE 1 TABLET BY MOUTH DAILY. TAKE WITH OR IMMEDIATELY FOLLOWING A MEAL., Disp: 90 tablet, Rfl: 3   Multiple Vitamin (MULTIVITAMIN) tablet, Take 1 tablet by mouth daily., Disp: , Rfl:    NON  FORMULARY, Bausch and lomb ointment-Muro-//128, 5%ointment- Each eye nightly, Disp: , Rfl:    rOPINIRole (REQUIP) 0.5 MG tablet, Take 1 tablet (0.5 mg total) by mouth at bedtime., Disp: 90 tablet, Rfl: 3   rosuvastatin (CRESTOR) 10 MG tablet, Take 0.5 tablets (5 mg total) by mouth daily., Disp: 90 tablet, Rfl: 3   valACYclovir (VALTREX) 500 MG tablet, One tab bid as needed for outbreak, Disp: 180 tablet, Rfl: 1   Review of Systems:   Review of Systems  Respiratory:  Negative for shortness of breath.   Cardiovascular:  Negative for chest pain.  Musculoskeletal:        (+) right calf pain    Vitals:   Vitals:   05/27/22 1008  BP: 130/80  Pulse: 67  Temp: 97.8 F (36.6 C)  TempSrc: Temporal  SpO2: 96%  Weight: 152 lb 6.1 oz (69.1 kg)  Height: '5\' 5"'$  (1.651 m)     Body mass index is 25.36 kg/m.  Physical Exam:   Physical Exam Vitals and nursing note reviewed.  Constitutional:      General: She is not in acute distress.    Appearance: Normal appearance. She is well-developed. She is not ill-appearing or toxic-appearing.  HENT:     Head: Normocephalic and atraumatic.     Right Ear: External ear normal.     Left Ear: External ear normal.  Eyes:     Extraocular Movements: Extraocular movements intact.     Pupils: Pupils are equal, round, and reactive to light.  Cardiovascular:     Rate and Rhythm: Normal rate and regular rhythm.     Pulses: Normal pulses.          Dorsalis pedis pulses are 2+ on the right side and 2+ on the left side.       Posterior tibial pulses are 2+ on the  right side and 2+ on the left side.     Heart sounds: Normal heart sounds, S1 normal and S2 normal. No murmur heard.    No gallop.     Comments: Adequate capillary refill in toes Pulmonary:     Effort: Pulmonary effort is normal. No respiratory distress.     Breath sounds: Normal breath sounds. No wheezing or rales.  Musculoskeletal:     Comments: Significant tenderness to right calf Small pea-sized lump found in right calf area without fluctuance or induration No calf swelling or erythema Negative Homans' sign Left leg circumference 39 cm Right leg circumference 38 cm  Skin:    General: Skin is warm and dry.  Neurological:     Mental Status: She is alert and oriented to person, place, and time.     GCS: GCS eye subscore is 4. GCS verbal subscore is 5. GCS motor subscore is 6.     Comments: Normal sensation in bilateral toes and feet  Psychiatric:        Speech: Speech normal.        Behavior: Behavior normal. Behavior is cooperative.        Judgment: Judgment normal.     Assessment and Plan:   Right calf pain Patient is quite concerned about blood clot Will obtain ultrasound to rule out DVT Denies any chest pain or dyspnea Will add on appropriate anticoagulation if indicated If ultrasound is negative we will treat as calf strain and consider follow-up with sports medicine if no improvement with supportive care  Easy bruising Reviewed most recent CBC panel that was normal We will repeat this per  her request and add on PT/INR Consider follow-up with hematology if any concerns  Hyperglycemia She is requesting A1c to be rechecked when due in 2 days I have placed future orders in today  I,Verona Buck,acting as a scribe for [employer], PA.,have documented all relevant documentation on the behalf of Inda Coke, PA,as directed by  Inda Coke, PA while in the presence of Inda Coke, [place].  I, Inda Coke, [place], have reviewed all documentation for this visit.  The documentation on 05/27/22 for the exam, diagnosis, procedures, and orders are all accurate and complete.  Inda Coke, PA-C

## 2022-05-27 NOTE — Patient Instructions (Addendum)
It was great to see you!  Update blood work today to assess for easy bruising.  Ultrasound to assess for your calf pain today.  You can schedule an appointment for labs to recheck your A1c next week.  If you develop any chest pain, shortness of breath -- please go to the ER.  Take care,  Inda Coke PA-C

## 2022-06-22 ENCOUNTER — Telehealth: Payer: Self-pay | Admitting: Family Medicine

## 2022-06-22 NOTE — Telephone Encounter (Signed)
Copied from [facility]. Topic: Medicare AWV >> Jun 22, 2022  9:30 AM Gillis Santa wrote: Reason for CRM: LVM PATIENT TO CALL KAREN @ [phone] TO SCHEDULE AWV [place]

## 2022-07-05 ENCOUNTER — Telehealth: Payer: Self-pay | Admitting: Family Medicine

## 2022-07-05 NOTE — Telephone Encounter (Signed)
Patient states she is going out of State on Friday 07/08/22.  Patient requests [facility] be called (per Pharmacist Ebony Hail they must be called by telephone in order for Patient to pick refill/RX for clonazePAM (KLONOPIN) 0.5 MG tablet )  Patient states she is going to be out of the above medication in 7 days but will be out of State by then.  [id number] [place] [phone] Fax: [phone]

## 2022-07-05 NOTE — Telephone Encounter (Signed)
Ok for early refill 

## 2022-07-05 NOTE — Telephone Encounter (Signed)
Yes thanks-you can call for authorizing early refill

## 2022-07-06 NOTE — Telephone Encounter (Signed)
Pharmacist stated she will refill Rx

## 2022-07-26 ENCOUNTER — Ambulatory Visit (INDEPENDENT_AMBULATORY_CARE_PROVIDER_SITE_OTHER): Payer: Medicare Other | Admitting: Radiology

## 2022-07-26 VITALS — BP 116/72 | Temp 98.6°F

## 2022-07-26 DIAGNOSIS — D2271 Melanocytic nevi of right lower limb, including hip: Secondary | ICD-10-CM | POA: Diagnosis not present

## 2022-07-26 DIAGNOSIS — D2262 Melanocytic nevi of left upper limb, including shoulder: Secondary | ICD-10-CM | POA: Diagnosis not present

## 2022-07-26 DIAGNOSIS — L821 Other seborrheic keratosis: Secondary | ICD-10-CM | POA: Diagnosis not present

## 2022-07-26 DIAGNOSIS — D2261 Melanocytic nevi of right upper limb, including shoulder: Secondary | ICD-10-CM | POA: Diagnosis not present

## 2022-07-26 DIAGNOSIS — N644 Mastodynia: Secondary | ICD-10-CM | POA: Diagnosis not present

## 2022-07-26 DIAGNOSIS — Z85828 Personal history of other malignant neoplasm of skin: Secondary | ICD-10-CM | POA: Diagnosis not present

## 2022-07-26 DIAGNOSIS — D225 Melanocytic nevi of trunk: Secondary | ICD-10-CM | POA: Diagnosis not present

## 2022-07-26 DIAGNOSIS — D2272 Melanocytic nevi of left lower limb, including hip: Secondary | ICD-10-CM | POA: Diagnosis not present

## 2022-07-26 DIAGNOSIS — D1801 Hemangioma of skin and subcutaneous tissue: Secondary | ICD-10-CM | POA: Diagnosis not present

## 2022-07-26 NOTE — Progress Notes (Signed)
   Amanda [name] 06/05/1950 [id number]   History:  72 y.o. G2P2 presents with complaints of sharp right breast pain x's 1 month, shooting pain right arm 4 days ago, very sensitive right breast nipple today. Denies any injury or new activity.   Gynecologic History No LMP recorded (lmp unknown). Patient has had a hysterectomy.    Last mammogram: 01/17/22. Results were: normal  Obstetric History OB History  Gravida Para Term Preterm AB Living  2 2 2     2   SAB IAB Ectopic Multiple Live Births               # Outcome Date GA Lbr Len/2nd Weight Sex Delivery Anes PTL Lv  2 Term           1 Term              The following portions of the patient's history were reviewed and updated as appropriate: allergies, current medications, past family history, past medical history, past social history, past surgical history, and problem list.  Review of Systems Pertinent items noted in HPI and remainder of comprehensive ROS otherwise negative.   Past medical history, past surgical history, family history and social history were all reviewed and documented in the EPIC chart.   Exam:  Vitals:   07/26/22 1507  BP: 116/72  Temp: 98.6 F (37 C)  TempSrc: Oral   There is no height or weight on file to calculate BMI.  Physical Exam Constitutional:      Appearance: Normal appearance. She is normal weight.  Pulmonary:     Effort: Pulmonary effort is normal.  Chest:     Chest wall: Tenderness present.  Breasts:    Right: Tenderness present. No swelling, inverted nipple, mass or skin change.     Left: Tenderness present. No swelling, inverted nipple, mass or skin change.  Lymphadenopathy:     Upper Body:     Right upper body: No supraclavicular or axillary adenopathy.     Left upper body: No supraclavicular or axillary adenopathy.  Neurological:     Mental Status: She is alert.  Psychiatric:        Mood and Affect: Mood normal.        Thought Content: Thought content normal.         Judgment: Judgment normal.      Patient informed chaperone available to be present for breast exam. Patient has requested no chaperone to be present.   Assessment/Plan:   1. Pain of right breast Dx mammo and u/s ordered    Jory Tanguma B WHNP-BC 3:59 PM 07/26/2022

## 2022-07-27 ENCOUNTER — Telehealth: Payer: Self-pay

## 2022-07-27 DIAGNOSIS — N644 Mastodynia: Secondary | ICD-10-CM

## 2022-07-27 NOTE — Telephone Encounter (Signed)
Orders placed in Epic

## 2022-07-27 NOTE — Telephone Encounter (Signed)
Dx mammo Received: Amanda Kalata, NP  P [facility] Triage Please schedule for right breast diagnostic mammogram for breast pain.

## 2022-07-27 NOTE — Telephone Encounter (Signed)
Spoke with Sherri at [facility] and scheduled patient for diagnostic breast imaging for 08/09/2022 at 12:40pm. To check in at 12:30pm.  Patient was informed. I did advise her that she can call for cancellation. Phone number and prompts provided.

## 2022-07-29 DIAGNOSIS — J342 Deviated nasal septum: Secondary | ICD-10-CM | POA: Insufficient documentation

## 2022-08-02 ENCOUNTER — Encounter: Payer: Self-pay | Admitting: Physician Assistant

## 2022-08-02 ENCOUNTER — Ambulatory Visit (INDEPENDENT_AMBULATORY_CARE_PROVIDER_SITE_OTHER): Payer: Medicare Other | Admitting: Physician Assistant

## 2022-08-02 VITALS — BP 130/80 | HR 69 | Temp 97.8°F | Ht 65.0 in | Wt 152.0 lb

## 2022-08-02 DIAGNOSIS — R1031 Right lower quadrant pain: Secondary | ICD-10-CM | POA: Diagnosis not present

## 2022-08-02 DIAGNOSIS — M79651 Pain in right thigh: Secondary | ICD-10-CM

## 2022-08-02 DIAGNOSIS — R739 Hyperglycemia, unspecified: Secondary | ICD-10-CM

## 2022-08-02 LAB — HEMOGLOBIN A1C: Hgb A1c MFr Bld: 5.7 % (ref 4.6–6.5)

## 2022-08-02 NOTE — Progress Notes (Signed)
Amanda [name] is a 72 y.o. female here for a new problem.  History of Present Illness:   Chief Complaint  Patient presents with   Thigh pain    Pt c/o of burning sensation back of right thigh x 2 weeks. Also having shooting pain right mid abdomen down into groin started last night.    HPI  Thigh pain She complains of a burning sensation on her right posterior thigh beginning about 2 weeks ago.  She also endorses shooting pain in her right mid abdomen that radiates to her groin beginning last night.   Abdominal pain She notes that the abdominal pain happened about 5-6 times while lying in bed.  She has been using FreezeMax to relieve symptoms, but has not taken any other OTC pain medications.   She stays active by walking daily and stretches every morning.   She endorses constipation, but notes this is not new. She took Dulcolax last night and earlier today. Denies any dysuria, frequency, or other urinary symptoms.   Past Medical History:  Diagnosis Date   Anemia    Anxiety    Celiac disease    Chronic constipation    COVID 02/12/2021   COVID-19 virus vaccine not available    Erythema migrans (Lyme disease) 12/17/2021   GERD (gastroesophageal reflux disease)    occasionally (per pt takes apple cider vinager)   Heart palpitations    cardiologist-- dr Ellyn Hack--- event monitor 08-24-2011 epic ;  nuclear stress test 03-16-2011 (epic) normal w/ no ischemia, ef 65%;  echo 01-18-2013  ef 60-65%, G1DD   Hiatal hernia    History of subdural hematoma    12/ 23/ 2010  s/p  craniotomy w/ hematoma evacuation (pt had a fall w/ concussion)  per pt no residual   History of thyroid cancer followed by dr Constance Holster---   s/p  left thyroidectomy --- per pt no radiation and no recurrence   Hyperlipidemia    IBS (irritable bowel syndrome)    Immunization, single disease 06/09/2019   1st dose moderma vaccine administered   Insomnia    OA (osteoarthritis)    Osteopenia, T score -2.1 FRAX  9.4%/1.3% stable from prior DEXA 06/2015   Restless leg syndrome    Right thyroid nodule    followed by dr Constance Holster--- last ultrasound in epic 03-30-2018 stable , to bx   STD (sexually transmitted disease), HSV    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   Wears glasses      Social History   Tobacco Use   Smoking status: Never   Smokeless tobacco: Never  Vaping Use   Vaping Use: Never used  Substance Use Topics   Alcohol use: Yes    Comment: 2-3 times weekly   Drug use: Never    Past Surgical History:  Procedure Laterality Date   ABDOMINAL HYSTERECTOMY  1986   ovaries remain   BREAST REDUCTION SURGERY Bilateral 1999   CATARACT EXTRACTION W/ INTRAOCULAR LENS  IMPLANT, BILATERAL  2019   COLONOSCOPY  last one 11-22-2018   ESOPHAGOGASTRODUODENOSCOPY     KNEE ARTHROSCOPY Bilateral 2002   REDUCTION MAMMAPLASTY Bilateral    SUBDURAL HEMATOMA EVACUATION VIA CRANIOTOMY  04-30-2009  @[facility]    left frontotemporapartietal    THYROID LOBECTOMY Left 05-04-2001  @[facility]   dr Constance Holster   TONSILLECTOMY     VULVECTOMY N/A 03/12/2019   Procedure: WIDE LOCAL EXCISION OF CARCINOMA IS SITU OF VULVA;  Surgeon: Anastasio Auerbach, MD;  Location: [place];  Service: Gynecology;  Laterality: N/A;  request to follow in [place] block time requests one hour colposcope available in OR with acetic acid.   VULVECTOMY N/A 06/17/2019   Procedure: WIDE LOCAL  EXCISION VULVECTOMY;  Surgeon: Everitt Amber, MD;  Location: [facility];  Service: Gynecology;  Laterality: N/A;    Family History  Problem Relation Age of Onset   Failure to thrive Mother        52   Liver cancer Father        alcohol related likely   Hypertension Father    Heart disease Maternal Grandmother    Heart disease Maternal Grandfather    Hyperlipidemia Maternal Grandfather    Hypertension Paternal Grandfather    Diabetes Maternal Aunt    Breast cancer Maternal Aunt 60   Colon cancer Neg Hx     Thyroid disease Neg Hx     Allergies  Allergen Reactions   Ambien [Zolpidem Tartrate] Other (See Comments)    Causes Neurological problems with very bad dizziness, pains, disorientation   Cortisone Other (See Comments)    REACTION: "mania" post ESI   Eszopiclone And Related Other (See Comments)    Causes Neurological problems with very bad dizziness, pains, disorientation   Levofloxacin Other (See Comments)    Muscle spasms and jerking movements   Dicloxacillin Other (See Comments)    Stomach problems, swelling of tongue   Dilantin [Phenytoin Sodium Extended] Hives and Itching   Erythromycin Other (See Comments)    Tears stomach up   Avelox [Moxifloxacin Hcl In Nacl]     Pt unsure of reaction.   Other     Celiac disease   Nitrofuran Derivatives Other (See Comments)    unknown   Phenytoin Sodium Extended Rash   Trazodone And Nefazodone Other (See Comments)    dizzy    Current Medications:   Current Outpatient Medications:    bisacodyl (DULCOLAX) 5 MG EC tablet, Take 5 mg by mouth daily as needed for moderate constipation., Disp: , Rfl:    CALCIUM PO, Take by mouth. Takes 2, Disp: , Rfl:    cholecalciferol (VITAMIN D3) 25 MCG (1000 UT) tablet, Take 1,000 Units by mouth daily., Disp: , Rfl:    clonazePAM (KLONOPIN) 0.5 MG tablet, TAKE 1 TABLET BY MOUTH AT BEDTIME., Disp: 90 tablet, Rfl: 1   CO ENZYME Q-10 PO, Take by mouth daily. , Disp: , Rfl:    Cyanocobalamin (VITAMIN B-12 ER PO), Take 5,000 mcg by mouth daily., Disp: , Rfl:    escitalopram (LEXAPRO) 10 MG tablet, Take 10 mg by mouth daily., Disp: , Rfl:    Melatonin 3 MG CAPS, Take by mouth at bedtime as needed. , Disp: , Rfl:    metoprolol succinate (TOPROL-XL) 25 MG 24 hr tablet, TAKE 1 TABLET BY MOUTH DAILY. TAKE WITH OR IMMEDIATELY FOLLOWING A MEAL., Disp: 90 tablet, Rfl: 3   Multiple Vitamin (MULTIVITAMIN) tablet, Take 1 tablet by mouth daily., Disp: , Rfl:    NON FORMULARY, Bausch and lomb ointment-Muro-//128,  5%ointment- Each eye nightly, Disp: , Rfl:    rOPINIRole (REQUIP) 0.5 MG tablet, Take 1 tablet (0.5 mg total) by mouth at bedtime., Disp: 90 tablet, Rfl: 3   rosuvastatin (CRESTOR) 10 MG tablet, Take 0.5 tablets (5 mg total) by mouth daily., Disp: 90 tablet, Rfl: 3   tretinoin (RETIN-A) 0.025 % cream, Apply 1 Application topically at bedtime., Disp: , Rfl:    valACYclovir (VALTREX) 500 MG tablet, One tab bid as  needed for outbreak, Disp: 180 tablet, Rfl: 1   Review of Systems:   Review of Systems  Gastrointestinal:  Positive for abdominal pain (right mid abd shooting pain radiates to groin) and constipation (not a new issue).  Musculoskeletal:  Positive for myalgias (burnign sensation- R posterior thigh).    Vitals:   Vitals:   08/02/22 1308  BP: 130/80  Pulse: 69  Temp: 97.8 F (36.6 C)  TempSrc: Temporal  SpO2: 98%  Weight: 152 lb (68.9 kg)  Height: 5\' 5"  (1.651 m)     Body mass index is 25.29 kg/m.  Physical Exam:   Physical Exam Constitutional:      Appearance: Normal appearance. She is well-developed.  HENT:     Head: Normocephalic and atraumatic.  Eyes:     General: Lids are normal.     Extraocular Movements: Extraocular movements intact.     Conjunctiva/sclera: Conjunctivae normal.  Pulmonary:     Effort: Pulmonary effort is normal.  Abdominal:     General: Abdomen is flat. Bowel sounds are normal.     Palpations: Abdomen is soft.     Tenderness: There is no abdominal tenderness.  Musculoskeletal:        General: Normal range of motion.     Cervical back: Normal range of motion and neck supple.     Comments: Slight TTP to R SI joint area and posterior thigh No decreased ROM No obvious skin changes  Skin:    General: Skin is warm and dry.  Neurological:     Mental Status: She is alert and oriented to person, place, and time.  Psychiatric:        Attention and Perception: Attention and perception normal.        Mood and Affect: Mood normal.         Behavior: Behavior normal.        Thought Content: Thought content normal.        Judgment: Judgment normal.     Assessment and Plan:   Right thigh pain No red flags on exam Suspect possible piriformis syndrome Recommend stretches (handout provided) and NSAIDs prn She is plugged in at [facility] -- will refer there if needed, lack of improvement or any worsening  Abdominal pain, RLQ No red flags Symptoms are resolved Possibly related to constipation Low threshold to further work-up if persists   I,Rachel Rivera,acting as a scribe for [employer], PA.,have documented all relevant documentation on the behalf of Inda Coke, PA,as directed by  Inda Coke, PA while in the presence of Inda Coke, [place].  I, Inda Coke, [place], have reviewed all documentation for this visit. The documentation on 08/02/22 for the exam, diagnosis, procedures, and orders are all accurate and complete.   Inda Coke, PA-C

## 2022-08-02 NOTE — Patient Instructions (Signed)
It was great to see you!  Start stretches for piriformis syndrome Trial ibuprofen/aleve  If no improvement, recommend [facility]  Take care,  Inda Coke PA-C

## 2022-08-09 ENCOUNTER — Ambulatory Visit
Admission: RE | Admit: 2022-08-09 | Discharge: 2022-08-09 | Disposition: A | Discharge status: Home / Self Care | Payer: Medicare Other | Source: Ambulatory Visit | Attending: Radiology | Admitting: Radiology

## 2022-08-09 DIAGNOSIS — N644 Mastodynia: Secondary | ICD-10-CM | POA: Diagnosis not present

## 2022-08-09 DIAGNOSIS — R922 Inconclusive mammogram: Secondary | ICD-10-CM | POA: Diagnosis not present

## 2022-08-10 DIAGNOSIS — M25562 Pain in left knee: Secondary | ICD-10-CM | POA: Diagnosis not present

## 2022-08-10 DIAGNOSIS — M545 Low back pain, unspecified: Secondary | ICD-10-CM | POA: Diagnosis not present

## 2022-08-10 DIAGNOSIS — M25551 Pain in right hip: Secondary | ICD-10-CM | POA: Diagnosis not present

## 2022-08-10 DIAGNOSIS — M170 Bilateral primary osteoarthritis of knee: Secondary | ICD-10-CM | POA: Diagnosis not present

## 2022-08-23 ENCOUNTER — Ambulatory Visit: Payer: Medicare Other | Admitting: Family

## 2022-09-07 DIAGNOSIS — M51369 Other intervertebral disc degeneration, lumbar region without mention of lumbar back pain or lower extremity pain: Secondary | ICD-10-CM | POA: Insufficient documentation

## 2022-09-07 DIAGNOSIS — M25511 Pain in right shoulder: Secondary | ICD-10-CM | POA: Diagnosis not present

## 2022-09-07 DIAGNOSIS — M5136 Other intervertebral disc degeneration, lumbar region: Secondary | ICD-10-CM | POA: Diagnosis not present

## 2022-09-07 DIAGNOSIS — M25512 Pain in left shoulder: Secondary | ICD-10-CM | POA: Diagnosis not present

## 2022-09-07 DIAGNOSIS — M545 Low back pain, unspecified: Secondary | ICD-10-CM | POA: Diagnosis not present

## 2022-09-26 DIAGNOSIS — M542 Cervicalgia: Secondary | ICD-10-CM | POA: Diagnosis not present

## 2022-09-26 DIAGNOSIS — M25512 Pain in left shoulder: Secondary | ICD-10-CM | POA: Diagnosis not present

## 2022-09-26 DIAGNOSIS — M5451 Vertebrogenic low back pain: Secondary | ICD-10-CM | POA: Diagnosis not present

## 2022-09-26 DIAGNOSIS — M25511 Pain in right shoulder: Secondary | ICD-10-CM | POA: Diagnosis not present

## 2022-09-26 DIAGNOSIS — M25562 Pain in left knee: Secondary | ICD-10-CM | POA: Diagnosis not present

## 2022-09-26 DIAGNOSIS — M25551 Pain in right hip: Secondary | ICD-10-CM | POA: Diagnosis not present

## 2022-10-05 ENCOUNTER — Other Ambulatory Visit: Payer: Self-pay | Admitting: Family Medicine

## 2022-10-05 DIAGNOSIS — M25512 Pain in left shoulder: Secondary | ICD-10-CM | POA: Diagnosis not present

## 2022-10-05 DIAGNOSIS — M25562 Pain in left knee: Secondary | ICD-10-CM | POA: Diagnosis not present

## 2022-10-05 DIAGNOSIS — M25551 Pain in right hip: Secondary | ICD-10-CM | POA: Diagnosis not present

## 2022-10-05 DIAGNOSIS — M542 Cervicalgia: Secondary | ICD-10-CM | POA: Diagnosis not present

## 2022-10-05 DIAGNOSIS — M5451 Vertebrogenic low back pain: Secondary | ICD-10-CM | POA: Diagnosis not present

## 2022-10-05 DIAGNOSIS — M25511 Pain in right shoulder: Secondary | ICD-10-CM | POA: Diagnosis not present

## 2022-10-12 DIAGNOSIS — M25551 Pain in right hip: Secondary | ICD-10-CM | POA: Diagnosis not present

## 2022-10-12 DIAGNOSIS — M25511 Pain in right shoulder: Secondary | ICD-10-CM | POA: Diagnosis not present

## 2022-10-12 DIAGNOSIS — M25512 Pain in left shoulder: Secondary | ICD-10-CM | POA: Diagnosis not present

## 2022-10-12 DIAGNOSIS — M25562 Pain in left knee: Secondary | ICD-10-CM | POA: Diagnosis not present

## 2022-10-12 DIAGNOSIS — M5451 Vertebrogenic low back pain: Secondary | ICD-10-CM | POA: Diagnosis not present

## 2022-10-12 DIAGNOSIS — M542 Cervicalgia: Secondary | ICD-10-CM | POA: Diagnosis not present

## 2022-10-20 DIAGNOSIS — M1712 Unilateral primary osteoarthritis, left knee: Secondary | ICD-10-CM | POA: Diagnosis not present

## 2022-10-25 ENCOUNTER — Telehealth: Payer: Self-pay | Admitting: Family Medicine

## 2022-10-25 DIAGNOSIS — M2550 Pain in unspecified joint: Secondary | ICD-10-CM

## 2022-10-25 NOTE — Telephone Encounter (Signed)
Pt states she needs a new referral to the Rheumatologist on [address], its been a long time since she was seen there sp she needs a new referral. Please advise.

## 2022-10-25 NOTE — Telephone Encounter (Signed)
Referral has been placed to [facility] on [facility].

## 2022-10-27 ENCOUNTER — Other Ambulatory Visit: Payer: Self-pay | Admitting: Family Medicine

## 2022-10-27 DIAGNOSIS — G2581 Restless legs syndrome: Secondary | ICD-10-CM

## 2022-10-27 DIAGNOSIS — M1712 Unilateral primary osteoarthritis, left knee: Secondary | ICD-10-CM | POA: Diagnosis not present

## 2022-11-03 DIAGNOSIS — M1712 Unilateral primary osteoarthritis, left knee: Secondary | ICD-10-CM | POA: Diagnosis not present

## 2022-11-13 IMAGING — MG MM DIGITAL SCREENING BILAT W/ TOMO AND CAD
8 series · 8 of 24 positions shown · non-contrast
Comparison: Previous exam(s).

CLINICAL DATA: Screening.

EXAM:
DIGITAL SCREENING BILATERAL MAMMOGRAM WITH TOMOSYNTHESIS AND CAD
TECHNIQUE: Bilateral screening digital craniocaudal and mediolateral oblique
mammograms were obtained. Bilateral screening digital breast
tomosynthesis was performed. The images were evaluated with
computer-aided detection.

[L CC synth-2D]
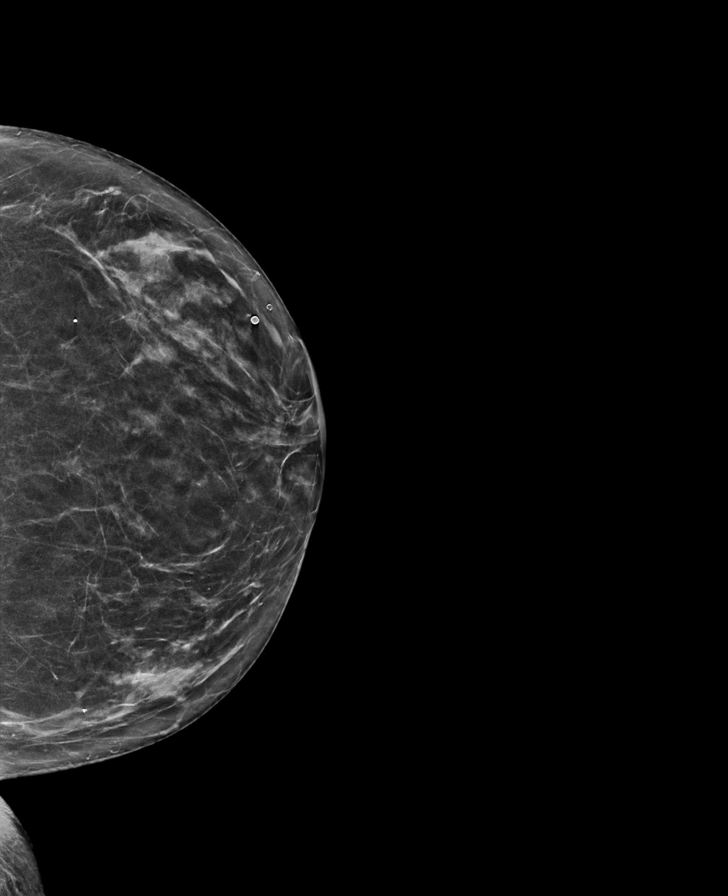

[R CC synth-2D]
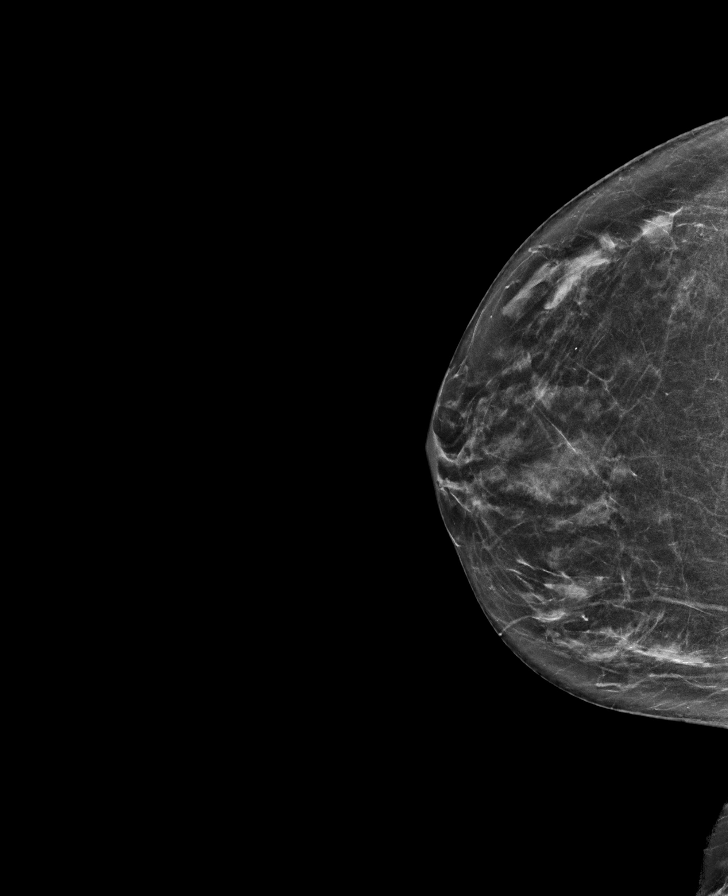

[L MLO synth-2D]
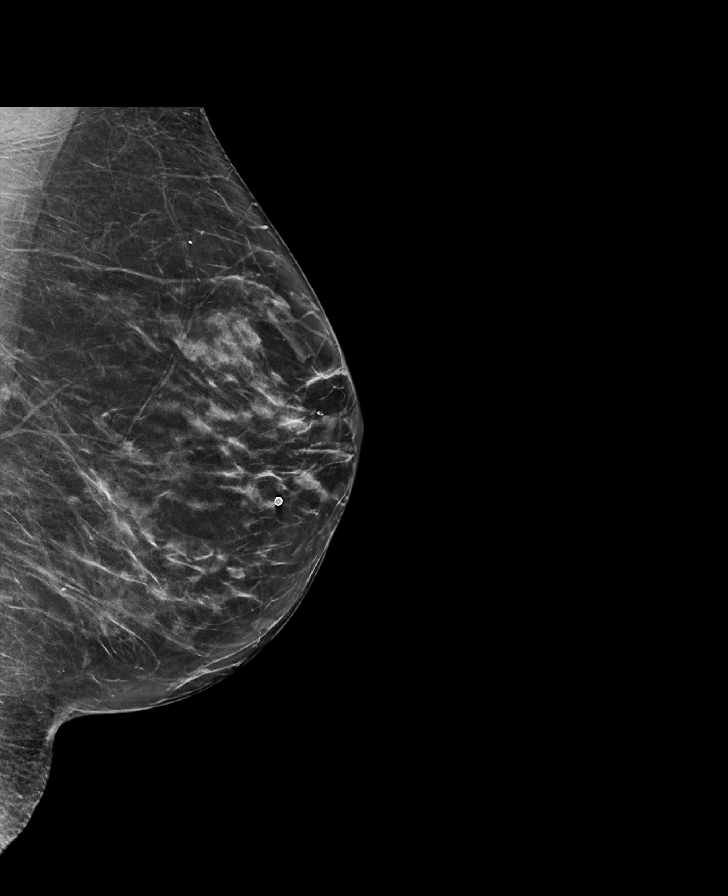

[R MLO synth-2D]
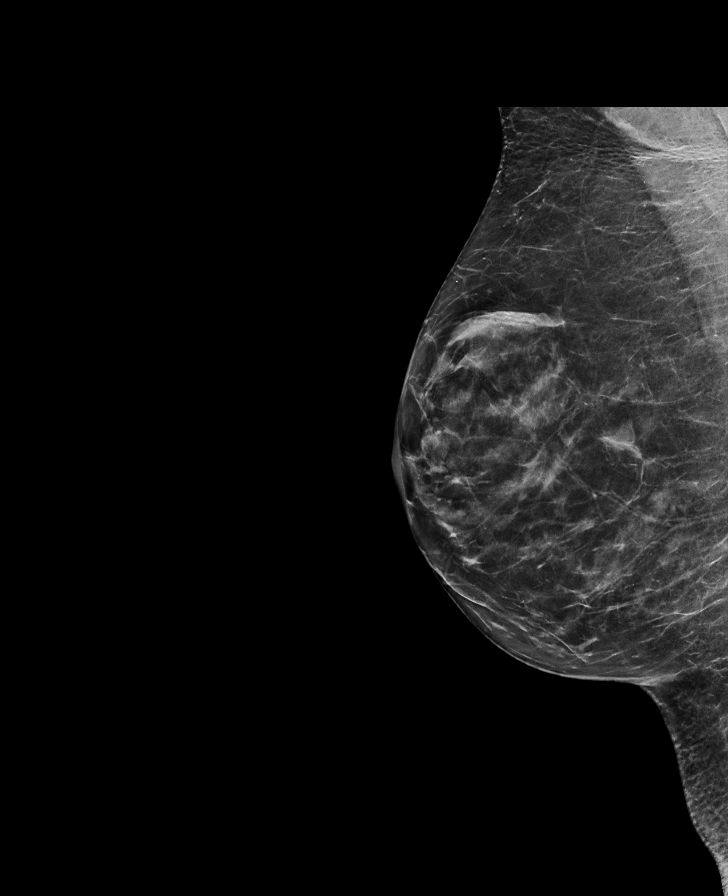

[L MLO tomo · tomo slice 33/65.0]
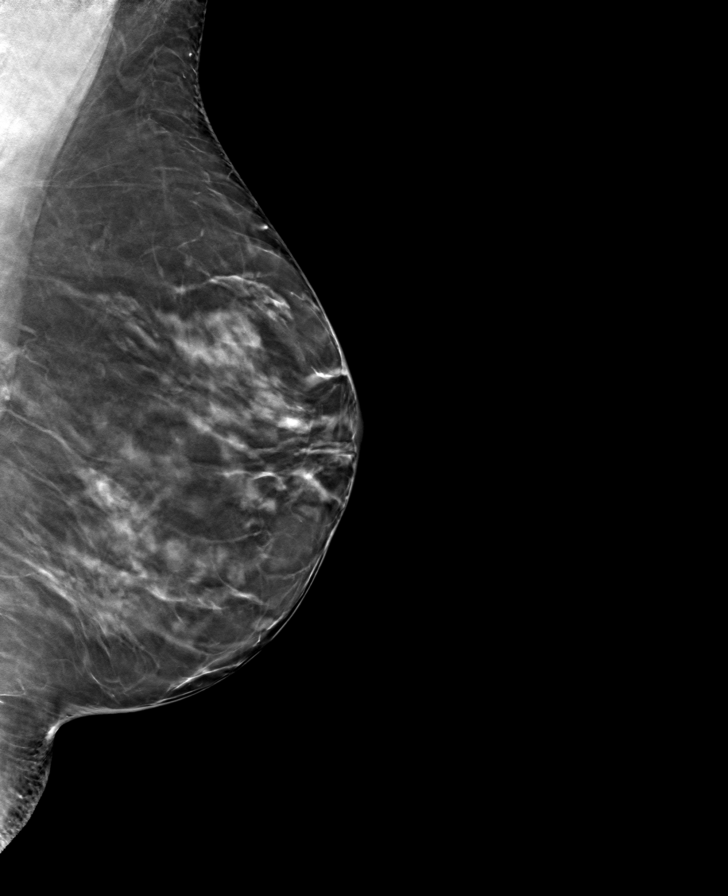

[R MLO tomo · tomo slice 33/64.0]
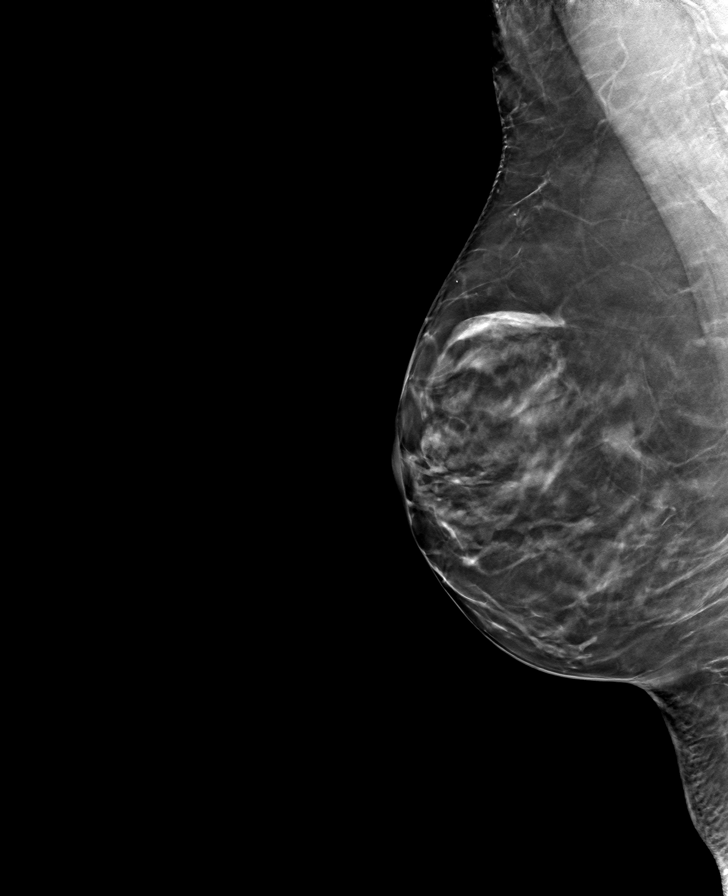

[L CC tomo · tomo slice 29/57.0]
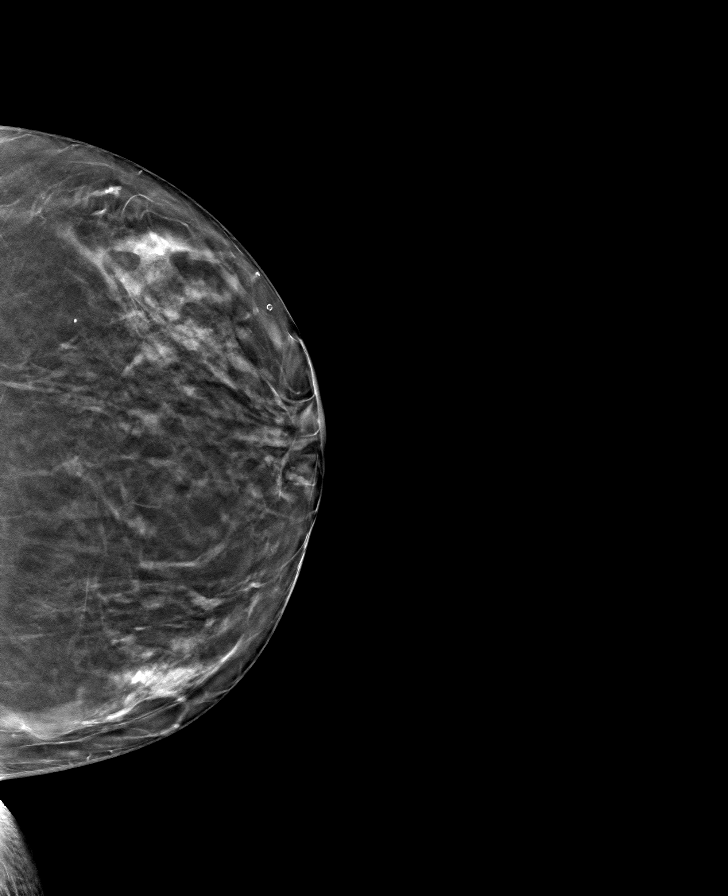

[R CC tomo · tomo slice 29/56.0]
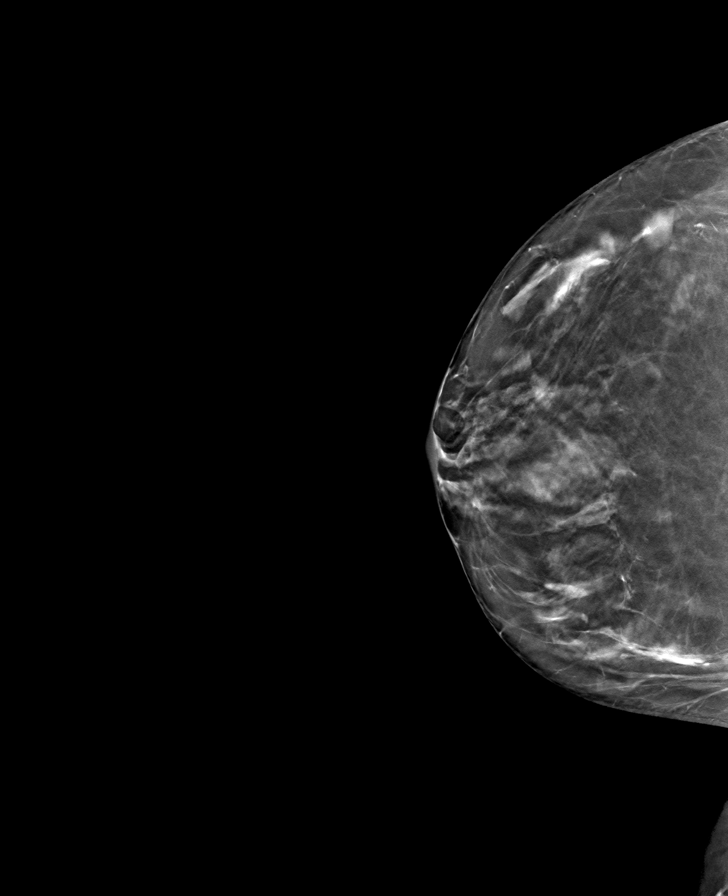

[8 of 24 positions shown; findings below may reference images not displayed]

ACR Breast Density Category b: There are scattered areas of
fibroglandular density.
FINDINGS: There are no findings suspicious for malignancy.
IMPRESSION: No mammographic evidence of malignancy. A result letter of this
screening mammogram will be mailed directly to the patient.

RECOMMENDATION:
Screening mammogram in one year. (Code:51-O-LD2)

BI-RADS CATEGORY  1: Negative.

## 2022-11-28 ENCOUNTER — Telehealth: Payer: Self-pay | Admitting: Gastroenterology

## 2022-11-28 NOTE — Telephone Encounter (Signed)
Amanda [name], we do not have any sooner appts at this time with PA or MD. Pt needs office visit due to symptoms of diarrhea, never seen for that previously. Please offer patient soonest appt, she can reach out to PCP in the interim. Thank you

## 2022-11-28 NOTE — Telephone Encounter (Signed)
PT is having GI issues/problems with her diarrhea and gut health in general. No availabilities until November. Requesting a call back

## 2022-12-12 DIAGNOSIS — M1712 Unilateral primary osteoarthritis, left knee: Secondary | ICD-10-CM | POA: Diagnosis not present

## 2022-12-20 ENCOUNTER — Other Ambulatory Visit: Payer: Self-pay | Admitting: Radiology

## 2022-12-20 DIAGNOSIS — Z1231 Encounter for screening mammogram for malignant neoplasm of breast: Secondary | ICD-10-CM

## 2022-12-21 DIAGNOSIS — Z961 Presence of intraocular lens: Secondary | ICD-10-CM | POA: Diagnosis not present

## 2022-12-21 DIAGNOSIS — H04129 Dry eye syndrome of unspecified lacrimal gland: Secondary | ICD-10-CM | POA: Diagnosis not present

## 2022-12-21 DIAGNOSIS — H18523 Epithelial (juvenile) corneal dystrophy, bilateral: Secondary | ICD-10-CM | POA: Diagnosis not present

## 2022-12-21 DIAGNOSIS — H18513 Endothelial corneal dystrophy, bilateral: Secondary | ICD-10-CM | POA: Diagnosis not present

## 2022-12-21 DIAGNOSIS — H26493 Other secondary cataract, bilateral: Secondary | ICD-10-CM | POA: Diagnosis not present

## 2022-12-26 ENCOUNTER — Ambulatory Visit: Payer: Medicare Other | Admitting: Family Medicine

## 2022-12-27 ENCOUNTER — Encounter: Payer: Self-pay | Admitting: Family Medicine

## 2022-12-27 ENCOUNTER — Ambulatory Visit (INDEPENDENT_AMBULATORY_CARE_PROVIDER_SITE_OTHER): Payer: Medicare Other | Admitting: Family Medicine

## 2022-12-27 VITALS — BP 120/78 | HR 72 | Temp 97.9°F | Resp 18 | Ht 65.0 in | Wt 152.1 lb

## 2022-12-27 DIAGNOSIS — R5383 Other fatigue: Secondary | ICD-10-CM

## 2022-12-27 DIAGNOSIS — S81801A Unspecified open wound, right lower leg, initial encounter: Secondary | ICD-10-CM

## 2022-12-27 LAB — COMPREHENSIVE METABOLIC PANEL
ALT: 13 U/L (ref 0–35)
AST: 20 U/L (ref 0–37)
Albumin: 4.6 g/dL (ref 3.5–5.2)
Alkaline Phosphatase: 71 U/L (ref 39–117)
BUN: 22 mg/dL (ref 6–23)
CO2: 27 mEq/L (ref 19–32)
Calcium: 9.6 mg/dL (ref 8.4–10.5)
Chloride: 102 mEq/L (ref 96–112)
Creatinine, Ser: 0.85 mg/dL (ref 0.40–1.20)
GFR: 68.31 mL/min (ref >60.00)
Glucose, Bld: 97 mg/dL (ref 70–99)
Potassium: 3.9 mEq/L (ref 3.5–5.1)
Sodium: 138 mEq/L (ref 135–145)
Total Bilirubin: 0.5 mg/dL (ref 0.2–1.2)
Total Protein: 7.6 g/dL (ref 6.0–8.3)

## 2022-12-27 LAB — CBC WITH DIFFERENTIAL/PLATELET
Basophils Absolute: 0 10*3/uL (ref 0.0–0.1)
Basophils Relative: 0.6 % (ref 0.0–3.0)
Eosinophils Absolute: 0.1 10*3/uL (ref 0.0–0.7)
Eosinophils Relative: 2.6 % (ref 0.0–5.0)
HCT: 40 % (ref 36.0–46.0)
Hemoglobin: 13 g/dL (ref 12.0–15.0)
Lymphocytes Relative: 20.6 % (ref 12.0–46.0)
Lymphs Abs: 1 10*3/uL (ref 0.7–4.0)
MCHC: 32.5 g/dL (ref 30.0–36.0)
MCV: 92.2 fl (ref 78.0–100.0)
Monocytes Absolute: 0.3 10*3/uL (ref 0.1–1.0)
Monocytes Relative: 5.8 % (ref 3.0–12.0)
Neutro Abs: 3.4 10*3/uL (ref 1.4–7.7)
Neutrophils Relative %: 70.4 % (ref 43.0–77.0)
Platelets: 234 10*3/uL (ref 150.0–400.0)
RBC: 4.34 Mil/uL (ref 3.87–5.11)
RDW: 12.9 % (ref 11.5–15.5)
WBC: 4.8 10*3/uL (ref 4.0–10.5)

## 2022-12-27 LAB — TSH: TSH: 0.83 u[IU]/mL (ref 0.35–5.50)

## 2022-12-27 NOTE — Patient Instructions (Addendum)
It was very nice to see you today!  Doing labs.  Monitor  Tdap at pharmacy   PLEASE NOTE:  If you had any lab tests please let us know if you have not heard back within a few days. You may see your results on MyChart before we have a chance to review them but we will give you a call once they are reviewed by [place]. If we ordered any referrals today, please let us know if you have not heard from their office within the next week.   Please try these tips to maintain a healthy lifestyle:  Eat most of your calories during the day when you are active. Eliminate processed foods including packaged sweets (pies, cakes, cookies), reduce intake of potatoes, white bread, white pasta, and white rice. Look for whole grain options, oat flour or almond flour.  Each meal should contain half fruits/vegetables, one quarter protein, and one quarter carbs (no bigger than a computer mouse).  Cut down on sweet beverages. This includes juice, soda, and sweet tea. Also watch fruit intake, though this is a healthier sweet option, it still contains natural sugar! Limit to 3 servings daily.  Drink at least 1 glass of water with each meal and aim for at least 8 glasses per day  Exercise at least 150 minutes every week.

## 2022-12-27 NOTE — Progress Notes (Signed)
Subjective:     Patient ID: Amanda [name], female    DOB: 1951/01/15, 72 y.o.   MRN: [id number]  Chief Complaint  Patient presents with   Wound    Wound on right lower leg not healing, will be 2 weeks on Friday    HPI Right lower leg lateral wound - Patient states she walked into the corner of a wooden staircase puncturing her right shin on August 9th. Following the injury, she washed her wound once she noticed bleeding. Denies any drainage. She applied neosporin about one week ago, Vaseline and peppermint essential oils. Endorsees intermittent stinging and difficulty walking in certain shoes such as tennis shoes. No f/c. Possibly getting better but still w/redness around it    Fatigue - Endorses increased fatigue over the past 2 weeks since her injury and getting a tooth extrication one week ago. Is on amoxicillin.  Has been dealing w/tooth for months. Denies fevers.    Health Maintenance Due  Topic Date Due   Medicare Annual Wellness (AWV)  02/13/2021    Past Medical History:  Diagnosis Date   Anemia    Anxiety    Celiac disease    Chronic constipation    COVID 02/12/2021   COVID-19 virus vaccine not available    Erythema migrans (Lyme disease) 12/17/2021   GERD (gastroesophageal reflux disease)    occasionally (per pt takes apple cider vinager)   Heart palpitations    cardiologist-- dr Herbie [place]--- event monitor 08-24-2011 epic ;  nuclear stress test 03-16-2011 (epic) normal w/ no ischemia, ef 65%;  echo 01-18-2013  ef 60-65%, G1DD   Hiatal hernia    History of subdural hematoma    12/ 23/ 2010  s/p  craniotomy w/ hematoma evacuation (pt had a fall w/ concussion)  per pt no residual   History of thyroid cancer followed by dr Pollyann Kennedy---   s/p  left thyroidectomy --- per pt no radiation and no recurrence   Hyperlipidemia    IBS (irritable bowel syndrome)    Immunization, single disease 06/09/2019   1st dose moderma vaccine administered   Insomnia    OA (osteoarthritis)     Osteopenia, T score -2.1 FRAX 9.4%/1.3% stable from prior DEXA 06/2015   Restless leg syndrome    Right thyroid nodule    followed by dr Pollyann Kennedy--- last ultrasound in epic 03-30-2018 stable , to bx   STD (sexually transmitted disease), HSV    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   Wears glasses     Past Surgical History:  Procedure Laterality Date   ABDOMINAL HYSTERECTOMY  1986   ovaries remain   BREAST REDUCTION SURGERY Bilateral 1999   CATARACT EXTRACTION W/ INTRAOCULAR LENS  IMPLANT, BILATERAL  2019   COLONOSCOPY  last one 11-22-2018   ESOPHAGOGASTRODUODENOSCOPY     KNEE ARTHROSCOPY Bilateral 2002   REDUCTION MAMMAPLASTY Bilateral    SUBDURAL HEMATOMA EVACUATION VIA CRANIOTOMY  04-30-2009  @[facility]    left frontotemporapartietal    THYROID LOBECTOMY Left 05-04-2001  @[facility]   dr Pollyann Kennedy   TONSILLECTOMY     VULVECTOMY N/A 03/12/2019   Procedure: WIDE LOCAL EXCISION OF CARCINOMA IS SITU OF VULVA;  Surgeon: Dara Lords, MD;  Location: [facility];  Service: Gynecology;  Laterality: N/A;  request to follow in [facility] block time requests one hour colposcope available in OR with acetic acid.   VULVECTOMY N/A 06/17/2019   Procedure: WIDE LOCAL  EXCISION VULVECTOMY;  Surgeon: Adolphus Birchwood, MD;  Location:  [facility];  Service: Gynecology;  Laterality: N/A;     Current Outpatient Medications:    amoxicillin (AMOXIL) 500 MG capsule, Take 500 mg by mouth 4 (four) times daily., Disp: , Rfl:    bisacodyl (DULCOLAX) 5 MG EC tablet, Take 5 mg by mouth daily as needed for moderate constipation., Disp: , Rfl:    CALCIUM PO, Take by mouth. Takes 2, Disp: , Rfl:    cholecalciferol (VITAMIN D3) 25 MCG (1000 UT) tablet, Take 1,000 Units by mouth daily., Disp: , Rfl:    clonazePAM (KLONOPIN) 0.5 MG tablet, TAKE 1 TABLET BY MOUTH EVERYDAY AT BEDTIME, Disp: 90 tablet, Rfl: 1   CO ENZYME Q-10 PO, Take by mouth daily. , Disp: , Rfl:    Cyanocobalamin  (VITAMIN B-12 ER PO), Take 5,000 mcg by mouth daily., Disp: , Rfl:    escitalopram (LEXAPRO) 10 MG tablet, Take 10 mg by mouth daily., Disp: , Rfl:    Melatonin 3 MG CAPS, Take by mouth at bedtime as needed. , Disp: , Rfl:    metoprolol succinate (TOPROL-XL) 25 MG 24 hr tablet, TAKE 1 TABLET BY MOUTH DAILY. TAKE WITH OR IMMEDIATELY FOLLOWING A MEAL., Disp: 90 tablet, Rfl: 3   Multiple Vitamin (MULTIVITAMIN) tablet, Take 1 tablet by mouth daily., Disp: , Rfl:    NON FORMULARY, Bausch and lomb ointment-Muro-//128, 5%ointment- Each eye nightly, Disp: , Rfl:    rOPINIRole (REQUIP) 0.25 MG tablet, TAKE 1 TABLET BY MOUTH EVERYDAY AT BEDTIME, Disp: 90 tablet, Rfl: 1   rOPINIRole (REQUIP) 0.5 MG tablet, Take 1 tablet (0.5 mg total) by mouth at bedtime., Disp: 90 tablet, Rfl: 3   rosuvastatin (CRESTOR) 10 MG tablet, Take 0.5 tablets (5 mg total) by mouth daily., Disp: 90 tablet, Rfl: 3   valACYclovir (VALTREX) 500 MG tablet, One tab bid as needed for outbreak, Disp: 180 tablet, Rfl: 1  Allergies  Allergen Reactions   Ambien [Zolpidem Tartrate] Other (See Comments)    Causes Neurological problems with very bad dizziness, pains, disorientation   Cortisone Other (See Comments)    REACTION: "mania" post ESI   Eszopiclone And Related Other (See Comments)    Causes Neurological problems with very bad dizziness, pains, disorientation   Levofloxacin Other (See Comments)    Muscle spasms and jerking movements   Zolpidem Other (See Comments)    Causes Neurological problems with very bad dizziness, pains, disorientation   Dicloxacillin Other (See Comments)    Stomach problems, swelling of tongue   Dilantin [Phenytoin Sodium Extended] Hives and Itching   Erythromycin Other (See Comments)    Tears stomach up   Avelox [Moxifloxacin Hcl In Nacl]     Pt unsure of reaction.   Eszopiclone Other (See Comments)   Influenza Virus Vaccine Other (See Comments)   Moxifloxacin Other (See Comments)   Nefazodone Other  (See Comments)   Other     Celiac disease   Phenytoin Other (See Comments)   Trazodone Other (See Comments)   Nitrofuran Derivatives Other (See Comments)    unknown   Phenytoin Sodium Extended Rash   Trazodone And Nefazodone Other (See Comments)    dizzy   ROS neg/noncontributory except as noted HPI/below      Objective:     BP 120/78   Pulse 72   Temp 97.9 F (36.6 C) (Temporal)   Resp 18   Ht 5\' 5"  (1.651 m)   Wt 152 lb 2 oz (69 kg)   LMP  (LMP Unknown)  SpO2 97%   BMI 25.31 kg/m  Wt Readings from Last 3 Encounters:  12/27/22 152 lb 2 oz (69 kg)  08/02/22 152 lb (68.9 kg)  05/27/22 152 lb 6.1 oz (69.1 kg)    Physical Exam   Gen: WDWN NAD HEENT: NCAT, conjunctiva not injected, sclera nonicteric  DP 2+B EXT:  no edema +Right lower leg lateral side. Large healing bruise. Wound- Not fluctuant. Slightly red and slightly warm. 1 cm wound itself. Healing well. DP 2+.   3 cm by 2.5 cm total redness.  Looking at her phone picture-seems to be improving MSK: no gross abnormalities.  NEURO: A&O x3.  CN II-XII intact.  PSYCH: normal mood. Good eye contact    Assessment & Plan:  Wound of right lower extremity, initial encounter  Fatigue, unspecified type -     CBC with Differential/Platelet -     Comprehensive metabolic panel -     TSH  1.  Wound of right lower extremity-going on about a week and a half now.  Some redness, but I think this is more due to healing, trauma, possibly addition of essential oils contact irritant.  I do not think this is cellulitis.  She will continue to monitor things.  If area of redness increases, pain increases-she will let us know and we will start Keflex 500 mg twice daily.  Tetanus shot was 2016.  Recommend getting a Tdap. 2.  Fatigue-not sure if this is from dental work it has been going on for several months and abscessed tooth, leg wound, other factors.  Will check labs  Return if symptoms worsen or fail to improve.   Germaine Pomfret  Rice,acting as a scribe for Angelena Sole, MD.,have documented all relevant documentation on the behalf of Angelena Sole, MD,as directed by  Angelena Sole, MD while in the presence of Angelena Sole, MD.  I, Angelena Sole, MD, have reviewed all documentation for this visit. The documentation on 12/27/22 for the exam, diagnosis, procedures, and orders are all accurate and complete.   Angelena Sole, MD

## 2022-12-28 NOTE — Progress Notes (Signed)
Normal labs.

## 2023-01-12 ENCOUNTER — Ambulatory Visit (INDEPENDENT_AMBULATORY_CARE_PROVIDER_SITE_OTHER): Payer: Medicare Other | Admitting: Physician Assistant

## 2023-01-12 VITALS — BP 124/84 | HR 70 | Temp 97.7°F | Ht 65.0 in | Wt 153.4 lb

## 2023-01-12 DIAGNOSIS — Z6825 Body mass index (BMI) 25.0-25.9, adult: Secondary | ICD-10-CM | POA: Diagnosis not present

## 2023-01-12 DIAGNOSIS — J029 Acute pharyngitis, unspecified: Secondary | ICD-10-CM | POA: Diagnosis not present

## 2023-01-12 DIAGNOSIS — M79641 Pain in right hand: Secondary | ICD-10-CM | POA: Diagnosis not present

## 2023-01-12 DIAGNOSIS — R768 Other specified abnormal immunological findings in serum: Secondary | ICD-10-CM | POA: Diagnosis not present

## 2023-01-12 DIAGNOSIS — M79642 Pain in left hand: Secondary | ICD-10-CM | POA: Diagnosis not present

## 2023-01-12 DIAGNOSIS — M1991 Primary osteoarthritis, unspecified site: Secondary | ICD-10-CM | POA: Diagnosis not present

## 2023-01-12 DIAGNOSIS — E663 Overweight: Secondary | ICD-10-CM | POA: Diagnosis not present

## 2023-01-12 LAB — POCT RAPID STREP A (OFFICE): Rapid Strep A Screen: NEGATIVE

## 2023-01-12 NOTE — Progress Notes (Signed)
Amanda [name] is a 72 y.o. female here for a new problem.  History of Present Illness:   Chief Complaint  Patient presents with   Sore Throat    Pt states having sore throat on and off for about a month. Pt states she's uses peppermint essential oils and seems to help.     HPI  Sore Throat: Complains of intermittent bilateral throat soreness and hoarseness that began a month ago. Reports that she has been using peppermint essential oil, which seems to alleviate soreness. Applies two drops on tongue 2-3 times per day.  Notes that she uses Claritin and a Neti pot for drainage. Also manages GERD symptoms with apple cider vinegar tablets.  Denies dysphagia, new medications, recent travel, recent infection, night sweats, unintentional weight loss, or hemoptysis.    Past Medical History:  Diagnosis Date   Anemia    Anxiety    Celiac disease    Chronic constipation    COVID 02/12/2021   COVID-19 virus vaccine not available    Erythema migrans (Lyme disease) 12/17/2021   GERD (gastroesophageal reflux disease)    occasionally (per pt takes apple cider vinager)   Heart palpitations    cardiologist-- dr Herbie [place]--- event monitor 08-24-2011 epic ;  nuclear stress test 03-16-2011 (epic) normal w/ no ischemia, ef 65%;  echo 01-18-2013  ef 60-65%, G1DD   Hiatal hernia    History of subdural hematoma    12/ 23/ 2010  s/p  craniotomy w/ hematoma evacuation (pt had a fall w/ concussion)  per pt no residual   History of thyroid cancer followed by dr Pollyann Kennedy---   s/p  left thyroidectomy --- per pt no radiation and no recurrence   Hyperlipidemia    IBS (irritable bowel syndrome)    Immunization, single disease 06/09/2019   1st dose moderma vaccine administered   Insomnia    OA (osteoarthritis)    Osteopenia, T score -2.1 FRAX 9.4%/1.3% stable from prior DEXA 06/2015   Restless leg syndrome    Right thyroid nodule    followed by dr Pollyann Kennedy--- last ultrasound in epic 03-30-2018 stable , to bx    STD (sexually transmitted disease), HSV    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   Wears glasses      Social History   Tobacco Use   Smoking status: Never   Smokeless tobacco: Never  Vaping Use   Vaping status: Never Used  Substance Use Topics   Alcohol use: Yes    Comment: 2-3 times weekly   Drug use: Never    Past Surgical History:  Procedure Laterality Date   ABDOMINAL HYSTERECTOMY  1986   ovaries remain   BREAST REDUCTION SURGERY Bilateral 1999   CATARACT EXTRACTION W/ INTRAOCULAR LENS  IMPLANT, BILATERAL  2019   COLONOSCOPY  last one 11-22-2018   ESOPHAGOGASTRODUODENOSCOPY     KNEE ARTHROSCOPY Bilateral 2002   REDUCTION MAMMAPLASTY Bilateral    SUBDURAL HEMATOMA EVACUATION VIA CRANIOTOMY  04-30-2009  @[facility]    left frontotemporapartietal    THYROID LOBECTOMY Left 05-04-2001  @[facility]   dr Pollyann Kennedy   TONSILLECTOMY     VULVECTOMY N/A 03/12/2019   Procedure: WIDE LOCAL EXCISION OF CARCINOMA IS SITU OF VULVA;  Surgeon: Dara Lords, MD;  Location: [facility];  Service: Gynecology;  Laterality: N/A;  request to follow in [facility] block time requests one hour colposcope available in OR with acetic acid.   VULVECTOMY N/A 06/17/2019   Procedure: WIDE LOCAL  EXCISION VULVECTOMY;  Surgeon: Adolphus Birchwood, MD;  Location: [facility];  Service: Gynecology;  Laterality: N/A;    Family History  Problem Relation Age of Onset   Failure to thrive Mother        56   Liver cancer Father        alcohol related likely   Hypertension Father    Heart disease Maternal Grandmother    Heart disease Maternal Grandfather    Hyperlipidemia Maternal Grandfather    Hypertension Paternal Grandfather    Diabetes Maternal Aunt    Breast cancer Maternal Aunt 60   Colon cancer Neg Hx    Thyroid disease Neg Hx     Allergies  Allergen Reactions   Ambien [Zolpidem Tartrate] Other (See Comments)    Causes Neurological problems with very bad  dizziness, pains, disorientation   Cortisone Other (See Comments)    REACTION: "mania" post ESI   Eszopiclone And Related Other (See Comments)    Causes Neurological problems with very bad dizziness, pains, disorientation   Levofloxacin Other (See Comments)    Muscle spasms and jerking movements   Zolpidem Other (See Comments)    Causes Neurological problems with very bad dizziness, pains, disorientation   Dicloxacillin Other (See Comments)    Stomach problems, swelling of tongue   Dilantin [Phenytoin Sodium Extended] Hives and Itching   Erythromycin Other (See Comments)    Tears stomach up   Avelox [Moxifloxacin Hcl In Nacl]     Pt unsure of reaction.   Eszopiclone Other (See Comments)   Influenza Virus Vaccine Other (See Comments)   Moxifloxacin Other (See Comments)   Nefazodone Other (See Comments)   Other     Celiac disease   Phenytoin Other (See Comments)   Trazodone Other (See Comments)   Nitrofuran Derivatives Other (See Comments)    unknown   Phenytoin Sodium Extended Rash   Trazodone And Nefazodone Other (See Comments)    dizzy    Current Medications:   Current Outpatient Medications:    Apple Cider Vinegar 188 MG CAPS, Take by mouth., Disp: , Rfl:    bisacodyl (DULCOLAX) 5 MG EC tablet, Take 5 mg by mouth daily as needed for moderate constipation., Disp: , Rfl:    CALCIUM PO, Take by mouth. Takes 2, Disp: , Rfl:    cholecalciferol (VITAMIN D3) 25 MCG (1000 UT) tablet, Take 1,000 Units by mouth daily., Disp: , Rfl:    clonazePAM (KLONOPIN) 0.5 MG tablet, TAKE 1 TABLET BY MOUTH EVERYDAY AT BEDTIME, Disp: 90 tablet, Rfl: 1   CO ENZYME Q-10 PO, Take by mouth daily. , Disp: , Rfl:    Cyanocobalamin (VITAMIN B-12 ER PO), Take 5,000 mcg by mouth daily., Disp: , Rfl:    escitalopram (LEXAPRO) 10 MG tablet, Take 10 mg by mouth daily., Disp: , Rfl:    loratadine (CLARITIN) 10 MG tablet, Take 10 mg by mouth daily., Disp: , Rfl:    Melatonin 3 MG CAPS, Take by mouth at  bedtime as needed. , Disp: , Rfl:    metoprolol succinate (TOPROL-XL) 25 MG 24 hr tablet, TAKE 1 TABLET BY MOUTH DAILY. TAKE WITH OR IMMEDIATELY FOLLOWING A MEAL., Disp: 90 tablet, Rfl: 3   Multiple Vitamin (MULTIVITAMIN) tablet, Take 1 tablet by mouth daily., Disp: , Rfl:    NON FORMULARY, Bausch and lomb ointment-Muro-//128, 5%ointment- Each eye nightly, Disp: , Rfl:    rOPINIRole (REQUIP) 0.25 MG tablet, TAKE 1 TABLET BY MOUTH EVERYDAY AT BEDTIME, Disp: 90 tablet, Rfl: 1  rOPINIRole (REQUIP) 0.5 MG tablet, Take 1 tablet (0.5 mg total) by mouth at bedtime., Disp: 90 tablet, Rfl: 3   rosuvastatin (CRESTOR) 10 MG tablet, Take 0.5 tablets (5 mg total) by mouth daily., Disp: 90 tablet, Rfl: 3   valACYclovir (VALTREX) 500 MG tablet, One tab bid as needed for outbreak, Disp: 180 tablet, Rfl: 1   Review of Systems:   Review of Systems  HENT:  Positive for sore throat (bilateral, intermittent).        (+) Hoarseness (intermittent)    Vitals:   Vitals:   01/12/23 1043  BP: 124/84  Pulse: 70  Temp: 97.7 F (36.5 C)  TempSrc: Temporal  SpO2: 97%  Weight: 153 lb 6.4 oz (69.6 kg)  Height: 5\' 5"  (1.651 m)     Body mass index is 25.53 kg/m.  Physical Exam:   Physical Exam Vitals and nursing note reviewed.  Constitutional:      General: She is not in acute distress.    Appearance: She is well-developed. She is not ill-appearing or toxic-appearing.  HENT:     Head: Normocephalic and atraumatic.     Right Ear: Tympanic membrane, ear canal and external ear normal. Tympanic membrane is not erythematous, retracted or bulging.     Left Ear: Tympanic membrane, ear canal and external ear normal. Tympanic membrane is not erythematous, retracted or bulging.     Nose: Nose normal.     Right Sinus: No maxillary sinus tenderness or frontal sinus tenderness.     Left Sinus: No maxillary sinus tenderness or frontal sinus tenderness.     Mouth/Throat:     Pharynx: Uvula midline. No posterior  oropharyngeal erythema.  Eyes:     General: Lids are normal.     Conjunctiva/sclera: Conjunctivae normal.  Neck:     Trachea: Trachea normal.  Cardiovascular:     Rate and Rhythm: Normal rate and regular rhythm.     Heart sounds: Normal heart sounds, S1 normal and S2 normal.  Pulmonary:     Effort: Pulmonary effort is normal.     Breath sounds: Normal breath sounds. No decreased breath sounds, wheezing, rhonchi or rales.  Lymphadenopathy:     Cervical: No cervical adenopathy.  Skin:    General: Skin is warm and dry.  Neurological:     Mental Status: She is alert.  Psychiatric:        Speech: Speech normal.        Behavior: Behavior normal. Behavior is cooperative.     Assessment and Plan:   Sore throat No obvious findings on exam I did caution on her use of apple cider vinegar and essential oil supplements as these can sometimes be detrimental but she states that they are helpful Recommend daily OTC (available over the counter without a prescription) Nexium or Prilosec to help control possible gastroesophageal reflux disease or laryngopharyngeal reflux  She has ENT provider and I discussed recommendations to reach out to them to discuss and further evaluate her symptom(s)   I,Emily Lagle,acting as a scribe for [employer], PA.,have documented all relevant documentation on the behalf of Jarold Motto, PA,as directed by  Jarold Motto, PA while in the presence of Jarold Motto, [place].  I, Jarold Motto, [place], have reviewed all documentation for this visit. The documentation on 01/12/23 for the exam, diagnosis, procedures, and orders are all accurate and complete.  Jarold Motto, PA-C

## 2023-01-12 NOTE — Patient Instructions (Signed)
It was great to see you!  Start daily OTC (available over the counter without a prescription) medication to reduce acid such as Prilosec or Nexium (generic is fine) -- try a two week course and see if this helps your symptoms   Reach out to ENT and schedule an appointment to follow-up on this -- if they require a new referral, please let us know  Take care,  Jarold Motto PA-C

## 2023-02-01 ENCOUNTER — Ambulatory Visit
Admission: RE | Admit: 2023-02-01 | Discharge: 2023-02-01 | Disposition: A | Discharge status: Home / Self Care | Payer: Medicare Other | Source: Ambulatory Visit | Attending: Radiology | Admitting: Radiology

## 2023-02-01 DIAGNOSIS — Z1231 Encounter for screening mammogram for malignant neoplasm of breast: Secondary | ICD-10-CM | POA: Diagnosis not present

## 2023-02-02 ENCOUNTER — Other Ambulatory Visit: Payer: Self-pay

## 2023-02-02 ENCOUNTER — Encounter (HOSPITAL_COMMUNITY): Payer: Self-pay

## 2023-02-02 ENCOUNTER — Emergency Department (HOSPITAL_COMMUNITY): Payer: Medicare Other

## 2023-02-02 ENCOUNTER — Inpatient Hospital Stay (HOSPITAL_COMMUNITY)
Admission: EM | Admit: 2023-02-02 | Discharge: 2023-02-04 | DRG: 282 | Disposition: A | Discharge status: Home / Self Care | Payer: Medicare Other | Attending: Internal Medicine | Admitting: Internal Medicine

## 2023-02-02 DIAGNOSIS — Z8249 Family history of ischemic heart disease and other diseases of the circulatory system: Secondary | ICD-10-CM | POA: Diagnosis not present

## 2023-02-02 DIAGNOSIS — Z887 Allergy status to serum and vaccine status: Secondary | ICD-10-CM | POA: Diagnosis not present

## 2023-02-02 DIAGNOSIS — Z85850 Personal history of malignant neoplasm of thyroid: Secondary | ICD-10-CM | POA: Diagnosis not present

## 2023-02-02 DIAGNOSIS — I249 Acute ischemic heart disease, unspecified: Principal | ICD-10-CM | POA: Diagnosis present

## 2023-02-02 DIAGNOSIS — F419 Anxiety disorder, unspecified: Secondary | ICD-10-CM | POA: Diagnosis present

## 2023-02-02 DIAGNOSIS — Z800 Family history of malignant neoplasm of digestive organs: Secondary | ICD-10-CM | POA: Diagnosis not present

## 2023-02-02 DIAGNOSIS — E876 Hypokalemia: Secondary | ICD-10-CM | POA: Diagnosis present

## 2023-02-02 DIAGNOSIS — E041 Nontoxic single thyroid nodule: Secondary | ICD-10-CM | POA: Diagnosis present

## 2023-02-02 DIAGNOSIS — I10 Essential (primary) hypertension: Secondary | ICD-10-CM | POA: Diagnosis present

## 2023-02-02 DIAGNOSIS — E7800 Pure hypercholesterolemia, unspecified: Secondary | ICD-10-CM | POA: Diagnosis present

## 2023-02-02 DIAGNOSIS — Z961 Presence of intraocular lens: Secondary | ICD-10-CM | POA: Diagnosis present

## 2023-02-02 DIAGNOSIS — E785 Hyperlipidemia, unspecified: Secondary | ICD-10-CM | POA: Diagnosis not present

## 2023-02-02 DIAGNOSIS — Z79899 Other long term (current) drug therapy: Secondary | ICD-10-CM | POA: Diagnosis not present

## 2023-02-02 DIAGNOSIS — Z8616 Personal history of COVID-19: Secondary | ICD-10-CM | POA: Diagnosis not present

## 2023-02-02 DIAGNOSIS — Z90710 Acquired absence of both cervix and uterus: Secondary | ICD-10-CM

## 2023-02-02 DIAGNOSIS — K219 Gastro-esophageal reflux disease without esophagitis: Secondary | ICD-10-CM | POA: Diagnosis present

## 2023-02-02 DIAGNOSIS — Z833 Family history of diabetes mellitus: Secondary | ICD-10-CM

## 2023-02-02 DIAGNOSIS — I2510 Atherosclerotic heart disease of native coronary artery without angina pectoris: Secondary | ICD-10-CM | POA: Diagnosis present

## 2023-02-02 DIAGNOSIS — G2581 Restless legs syndrome: Secondary | ICD-10-CM | POA: Diagnosis present

## 2023-02-02 DIAGNOSIS — E890 Postprocedural hypothyroidism: Secondary | ICD-10-CM | POA: Diagnosis not present

## 2023-02-02 DIAGNOSIS — K900 Celiac disease: Secondary | ICD-10-CM | POA: Diagnosis present

## 2023-02-02 DIAGNOSIS — I214 Non-ST elevation (NSTEMI) myocardial infarction: Secondary | ICD-10-CM | POA: Diagnosis present

## 2023-02-02 DIAGNOSIS — Z883 Allergy status to other anti-infective agents status: Secondary | ICD-10-CM | POA: Diagnosis not present

## 2023-02-02 DIAGNOSIS — I493 Ventricular premature depolarization: Secondary | ICD-10-CM | POA: Diagnosis present

## 2023-02-02 DIAGNOSIS — Z881 Allergy status to other antibiotic agents status: Secondary | ICD-10-CM

## 2023-02-02 DIAGNOSIS — R0789 Other chest pain: Secondary | ICD-10-CM | POA: Diagnosis not present

## 2023-02-02 DIAGNOSIS — R079 Chest pain, unspecified: Secondary | ICD-10-CM | POA: Diagnosis not present

## 2023-02-02 DIAGNOSIS — E7849 Other hyperlipidemia: Secondary | ICD-10-CM | POA: Diagnosis not present

## 2023-02-02 LAB — CBC WITH DIFFERENTIAL/PLATELET
Abs Immature Granulocytes: 0 10*3/uL (ref 0.00–0.07)
Basophils Absolute: 0 10*3/uL (ref 0.0–0.1)
Basophils Relative: 1 %
Eosinophils Absolute: 0.1 10*3/uL (ref 0.0–0.5)
Eosinophils Relative: 2 %
HCT: 43.8 % (ref 36.0–46.0)
Hemoglobin: 14.2 g/dL (ref 12.0–15.0)
Immature Granulocytes: 0 %
Lymphocytes Relative: 29 %
Lymphs Abs: 1.5 10*3/uL (ref 0.7–4.0)
MCH: 29.8 pg (ref 26.0–34.0)
MCHC: 32.4 g/dL (ref 30.0–36.0)
MCV: 91.8 fL (ref 80.0–100.0)
Monocytes Absolute: 0.3 10*3/uL (ref 0.1–1.0)
Monocytes Relative: 5 %
Neutro Abs: 3.4 10*3/uL (ref 1.7–7.7)
Neutrophils Relative %: 63 %
Platelets: 260 10*3/uL (ref 150–400)
RBC: 4.77 MIL/uL (ref 3.87–5.11)
RDW: 12.5 % (ref 11.5–15.5)
WBC: 5.3 10*3/uL (ref 4.0–10.5)
nRBC: 0 % (ref 0.0–0.2)

## 2023-02-02 LAB — COMPREHENSIVE METABOLIC PANEL
ALT: 19 U/L (ref 0–44)
AST: 41 U/L (ref 15–41)
Albumin: 4.7 g/dL (ref 3.5–5.0)
Alkaline Phosphatase: 82 U/L (ref 38–126)
Anion gap: 12 (ref 5–15)
BUN: 17 mg/dL (ref 8–23)
CO2: 23 mmol/L (ref 22–32)
Calcium: 9.8 mg/dL (ref 8.9–10.3)
Chloride: 102 mmol/L (ref 98–111)
Creatinine, Ser: 0.96 mg/dL (ref 0.44–1.00)
GFR, Estimated: >60 mL/min (ref >60)
Glucose, Bld: 152 mg/dL — ABNORMAL HIGH (ref 70–99)
Potassium: 4 mmol/L (ref 3.5–5.1)
Sodium: 137 mmol/L (ref 135–145)
Total Bilirubin: 0.9 mg/dL (ref 0.3–1.2)
Total Protein: 8.7 g/dL — ABNORMAL HIGH (ref 6.5–8.1)

## 2023-02-02 LAB — I-STAT CHEM 8, ED
BUN: 16 mg/dL (ref 8–23)
Calcium, Ion: 1.12 mmol/L — ABNORMAL LOW (ref 1.15–1.40)
Chloride: 105 mmol/L (ref 98–111)
Creatinine, Ser: 1 mg/dL (ref 0.44–1.00)
Glucose, Bld: 161 mg/dL — ABNORMAL HIGH (ref 70–99)
HCT: 40 % (ref 36.0–46.0)
Hemoglobin: 13.6 g/dL (ref 12.0–15.0)
Potassium: 3.4 mmol/L — ABNORMAL LOW (ref 3.5–5.1)
Sodium: 140 mmol/L (ref 135–145)
TCO2: 23 mmol/L (ref 22–32)

## 2023-02-02 LAB — PROTIME-INR
INR: 0.9 (ref 0.8–1.2)
Prothrombin Time: 12.2 seconds (ref 11.4–15.2)

## 2023-02-02 LAB — TROPONIN I (HIGH SENSITIVITY)
Troponin I (High Sensitivity): 4 ng/L (ref <18)
Troponin I (High Sensitivity): 255 ng/L (ref <18)

## 2023-02-02 LAB — MRSA NEXT GEN BY PCR, NASAL: MRSA by PCR Next Gen: NOT DETECTED

## 2023-02-02 LAB — LIPID PANEL
Cholesterol: 291 mg/dL — ABNORMAL HIGH (ref 0–200)
HDL: 91 mg/dL (ref >40)
LDL Cholesterol: 157 mg/dL — ABNORMAL HIGH (ref 0–99)
Total CHOL/HDL Ratio: 3.2 RATIO
Triglycerides: 215 mg/dL — ABNORMAL HIGH (ref <150)
VLDL: 43 mg/dL — ABNORMAL HIGH (ref 0–40)

## 2023-02-02 LAB — I-STAT CG4 LACTIC ACID, ED: Lactic Acid, Venous: 1.9 mmol/L (ref 0.5–1.9)

## 2023-02-02 LAB — HEMOGLOBIN A1C
Hgb A1c MFr Bld: 5.7 % — ABNORMAL HIGH (ref 4.8–5.6)
Mean Plasma Glucose: 116.89 mg/dL

## 2023-02-02 LAB — APTT: aPTT: 29 seconds (ref 24–36)

## 2023-02-02 LAB — CBG MONITORING, ED: Glucose-Capillary: 144 mg/dL — ABNORMAL HIGH (ref 70–99)

## 2023-02-02 MED ORDER — NITROGLYCERIN 0.4 MG SL SUBL: 0.4000 mg | SUBLINGUAL_TABLET | SUBLINGUAL | Status: DC | PRN

## 2023-02-02 MED ORDER — ACETAMINOPHEN 325 MG PO TABS: 650.0000 mg | ORAL_TABLET | ORAL | Status: DC | PRN

## 2023-02-02 MED ORDER — VITAMIN B-12 1000 MCG PO TABS
5000.0000 ug | ORAL_TABLET | Freq: Every day | ORAL | Status: DC
  Administered 2023-02-03: 5000 ug via ORAL
  Filled 2023-02-02 (×2): qty 5

## 2023-02-02 MED ORDER — METOPROLOL SUCCINATE ER 25 MG PO TB24
25.0000 mg | ORAL_TABLET | Freq: Every day | ORAL | Status: DC
  Administered 2023-02-03 – 2023-02-04 (×2): 25 mg via ORAL
  Filled 2023-02-02 (×2): qty 1

## 2023-02-02 MED ORDER — CLONAZEPAM 0.5 MG PO TABS
0.5000 mg | ORAL_TABLET | Freq: Every day | ORAL | Status: DC
  Administered 2023-02-02 – 2023-02-03 (×2): 0.5 mg via ORAL
  Filled 2023-02-02 (×2): qty 1

## 2023-02-02 MED ORDER — ROPINIROLE HCL 0.5 MG PO TABS
0.5000 mg | ORAL_TABLET | Freq: Every day | ORAL | Status: DC
  Administered 2023-02-02 – 2023-02-03 (×2): 0.5 mg via ORAL
  Filled 2023-02-02 (×3): qty 1

## 2023-02-02 MED ORDER — HEPARIN (PORCINE) 25000 UT/250ML-% IV SOLN: 800.0000 [IU]/h | INTRAVENOUS | Status: DC

## 2023-02-02 MED ORDER — ASPIRIN 81 MG PO CHEW
324.0000 mg | CHEWABLE_TABLET | Freq: Once | ORAL | Status: AC
  Administered 2023-02-02: 324 mg via ORAL
  Filled 2023-02-02: qty 4

## 2023-02-02 MED ORDER — ESCITALOPRAM OXALATE 10 MG PO TABS
10.0000 mg | ORAL_TABLET | Freq: Every day | ORAL | Status: DC
  Administered 2023-02-03 – 2023-02-04 (×2): 10 mg via ORAL
  Filled 2023-02-02 (×2): qty 1

## 2023-02-02 MED ORDER — HEPARIN (PORCINE) 25000 UT/250ML-% IV SOLN
800.0000 [IU]/h | INTRAVENOUS | Status: DC
  Administered 2023-02-02: 800 [IU]/h via INTRAVENOUS
  Filled 2023-02-02: qty 250

## 2023-02-02 MED ORDER — SODIUM CHLORIDE 0.9 % IV SOLN: INTRAVENOUS | Status: DC

## 2023-02-02 MED ORDER — ROSUVASTATIN CALCIUM 20 MG PO TABS: 20.0000 mg | ORAL_TABLET | Freq: Every day | ORAL | Status: DC

## 2023-02-02 MED ORDER — ONDANSETRON HCL 4 MG/2ML IJ SOLN: 4.0000 mg | Freq: Four times a day (QID) | INTRAMUSCULAR | Status: DC | PRN

## 2023-02-02 MED ORDER — HEPARIN SODIUM (PORCINE) 5000 UNIT/ML IJ SOLN
4000.0000 [IU] | Freq: Once | INTRAMUSCULAR | Status: AC
  Administered 2023-02-02: 4000 [IU] via INTRAVENOUS
  Filled 2023-02-02: qty 1

## 2023-02-02 MED ORDER — ASPIRIN 81 MG PO TBEC
81.0000 mg | DELAYED_RELEASE_TABLET | Freq: Every day | ORAL | Status: DC
  Administered 2023-02-03 – 2023-02-04 (×2): 81 mg via ORAL
  Filled 2023-02-02 (×2): qty 1

## 2023-02-02 MED ORDER — VITAMIN D 25 MCG (1000 UNIT) PO TABS
1000.0000 [IU] | ORAL_TABLET | Freq: Every day | ORAL | Status: DC
  Administered 2023-02-03: 1000 [IU] via ORAL
  Filled 2023-02-02 (×2): qty 1

## 2023-02-02 MED ORDER — MELATONIN 3 MG PO TABS
3.0000 mg | ORAL_TABLET | Freq: Every evening | ORAL | Status: DC | PRN
  Administered 2023-02-03: 3 mg via ORAL
  Filled 2023-02-02: qty 1

## 2023-02-02 MED ORDER — LORATADINE 10 MG PO TABS
10.0000 mg | ORAL_TABLET | Freq: Every day | ORAL | Status: DC
  Filled 2023-02-02 (×2): qty 1

## 2023-02-02 NOTE — H&P (Signed)
Cardiology Admission History and Physical   Patient ID: Amanda [name] MRN: [id number]; DOB: [birth year]   Admission date: 02/02/2023  PCP:  Shelva Majestic, MD   [facility] Providers Cardiologist:  Bryan Lemma, MD        Chief Complaint:  Chest pain  Patient Profile:   Amanda [name] is a 72 y.o. female with a significant past medical history hypercholesteremia and non-sustained supraventricular tachycardia who is being seen 02/02/2023 for the evaluation of chest pain.  History of Present Illness:   Amanda [name] reports while out to dinner this evening she immediately noticed pain at her upper chest that radiated throughout her upper chest after using the restroom.  The pain was described as a heaviness, intense, and associated with sweaty palms, nausea, lightheadedness, and fatigue.  She tried to let it past by resting at the dinner table but her symptoms didn't resolve after 25 minutes for which she told her husband to drive her to the emergency department.  Patient went to [facility] which the initial ECG was of poor quality but demonstrated repolarization changes with ST elevations noted in the inferior lead.  Repeat ECG was concerning for ST elevations in the inferior leads with posterior reciprical changes.  Subsequent ECG didn't demonstrate ST elevations.  STEMI was initially initiated but discontinued after subsequent ECGs that didn't demonstrated ST elevations.  Initial HS-troponin was normal.  Patient was transferred to our facility for further management.  Patient did have a repeat episode of chest pain that lasted less than 10 minutes later in the evening that has resolved.  She is now chest pain free.  She denies any other symptoms such as palpitations, swelling in her legs, shortness of breath with lying flat, or waking up due to shortness of breath.  Past Medical History:  Diagnosis Date   Anemia    Anxiety    Celiac disease    Chronic  constipation    COVID 02/12/2021   COVID-19 virus vaccine not available    Erythema migrans (Lyme disease) 12/17/2021   GERD (gastroesophageal reflux disease)    occasionally (per pt takes apple cider vinager)   Heart palpitations    cardiologist-- dr Herbie [place]--- event monitor 08-24-2011 epic ;  nuclear stress test 03-16-2011 (epic) normal w/ no ischemia, ef 65%;  echo 01-18-2013  ef 60-65%, G1DD   Hiatal hernia    History of subdural hematoma    12/ 23/ 2010  s/p  craniotomy w/ hematoma evacuation (pt had a fall w/ concussion)  per pt no residual   History of thyroid cancer followed by dr Pollyann Kennedy---   s/p  left thyroidectomy --- per pt no radiation and no recurrence   Hyperlipidemia    IBS (irritable bowel syndrome)    Immunization, single disease 06/09/2019   1st dose moderma vaccine administered   Insomnia    OA (osteoarthritis)    Osteopenia, T score -2.1 FRAX 9.4%/1.3% stable from prior DEXA 06/2015   Restless leg syndrome    Right thyroid nodule    followed by dr Pollyann Kennedy--- last ultrasound in epic 03-30-2018 stable , to bx   STD (sexually transmitted disease), HSV    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   Wears glasses     Past Surgical History:  Procedure Laterality Date   ABDOMINAL HYSTERECTOMY  1986   ovaries remain   BREAST REDUCTION SURGERY Bilateral 1999   CATARACT EXTRACTION W/ INTRAOCULAR LENS  IMPLANT, BILATERAL  2019  COLONOSCOPY  last one 11-22-2018   ESOPHAGOGASTRODUODENOSCOPY     KNEE ARTHROSCOPY Bilateral 2002   REDUCTION MAMMAPLASTY Bilateral    SUBDURAL HEMATOMA EVACUATION VIA CRANIOTOMY  04-30-2009  @[facility]    left frontotemporapartietal    THYROID LOBECTOMY Left 05-04-2001  @[facility]   dr Pollyann Kennedy   TONSILLECTOMY     VULVECTOMY N/A 03/12/2019   Procedure: WIDE LOCAL EXCISION OF CARCINOMA IS SITU OF VULVA;  Surgeon: Dara Lords, MD;  Location: [facility];  Service: Gynecology;  Laterality: N/A;  request to follow in [place]  Gyn block time requests one hour colposcope available in OR with acetic acid.   VULVECTOMY N/A 06/17/2019   Procedure: WIDE LOCAL  EXCISION VULVECTOMY;  Surgeon: Adolphus Birchwood, MD;  Location: [facility];  Service: Gynecology;  Laterality: N/A;     Medications Prior to Admission: Prior to Admission medications   Medication Sig Start Date End Date Taking? Authorizing Provider  Apple Cider Vinegar 188 MG CAPS Take by mouth.    [provider]  bisacodyl (DULCOLAX) 5 MG EC tablet Take 5 mg by mouth daily as needed for moderate constipation.    [provider]  CALCIUM PO Take by mouth. Takes 2    [provider]  cholecalciferol (VITAMIN D3) 25 MCG (1000 UT) tablet Take 1,000 Units by mouth daily.    [provider]  clonazePAM (KLONOPIN) 0.5 MG tablet TAKE 1 TABLET BY MOUTH EVERYDAY AT BEDTIME 10/05/22   Shelva Majestic, MD  CO ENZYME Q-10 PO Take by mouth daily.     [provider]  Cyanocobalamin (VITAMIN B-12 ER PO) Take 5,000 mcg by mouth daily.    [provider]  escitalopram (LEXAPRO) 10 MG tablet Take 10 mg by mouth daily. 08/25/19   [provider]  loratadine (CLARITIN) 10 MG tablet Take 10 mg by mouth daily.    [provider]  Melatonin 3 MG CAPS Take by mouth at bedtime as needed.     [provider]  metoprolol succinate (TOPROL-XL) 25 MG 24 hr tablet TAKE 1 TABLET BY MOUTH DAILY. TAKE WITH OR IMMEDIATELY FOLLOWING A MEAL. 02/11/22   Marykay Lex, MD  Multiple Vitamin (MULTIVITAMIN) tablet Take 1 tablet by mouth daily.    [provider]  NON FORMULARY Bausch and lomb ointment-Muro-//128, 5%ointment- Each eye nightly    [provider]  rOPINIRole (REQUIP) 0.5 MG tablet Take 1 tablet (0.5 mg total) by mouth at bedtime. 03/29/22   Shelva Majestic, MD  rosuvastatin (CRESTOR) 10 MG tablet Take 0.5 tablets (5 mg total) by mouth daily. 03/07/22   Shelva Majestic, MD   valACYclovir Ralph Dowdy) 500 MG tablet One tab bid as needed for outbreak 02/25/22   Shelva Majestic, MD     Allergies:    Allergies  Allergen Reactions   Ambien [Zolpidem Tartrate] Other (See Comments)    Causes Neurological problems with very bad dizziness, pains, disorientation   Cortisone Other (See Comments)    REACTION: "mania" post ESI   Eszopiclone And Related Other (See Comments)    Causes Neurological problems with very bad dizziness, pains, disorientation   Levofloxacin Other (See Comments)    Muscle spasms and jerking movements   Zolpidem Other (See Comments)    Causes Neurological problems with very bad dizziness, pains, disorientation   Dicloxacillin Other (See Comments)    Stomach problems, swelling of tongue   Dilantin [Phenytoin Sodium Extended] Hives and Itching   Erythromycin Other (  See Comments)    Tears stomach up   Avelox [Moxifloxacin Hcl In Nacl]     Pt unsure of reaction.   Eszopiclone Other (See Comments)   Influenza Virus Vaccine Other (See Comments)   Moxifloxacin Other (See Comments)   Nefazodone Other (See Comments)   Other     Celiac disease   Phenytoin Other (See Comments)   Trazodone Other (See Comments)   Nitrofuran Derivatives Other (See Comments)    unknown   Phenytoin Sodium Extended Rash   Trazodone And Nefazodone Other (See Comments)    dizzy    Social History:   Social History   Socioeconomic History   Marital status: Married    Spouse name: Not on file   Number of children: 2   Years of education: Not on file   Highest education level: Not on file  Occupational History   Occupation: retired  Tobacco Use   Smoking status: Never   Smokeless tobacco: Never  Vaping Use   Vaping status: Never Used  Substance and Sexual Activity   Alcohol use: Yes    Comment: 2-3 times weekly   Drug use: Never   Sexual activity: Not Currently    Partners: Male    Birth control/protection: Surgical    Comment: HYST-1st intercourse 72  yo-Fewer than 5 partners  Other Topics Concern   Not on file  Social History Narrative   Married mother of 31 with 64 year old granddaughter (born in 2019) and another due in 2022. Her son Alycia Rossetti is patient of Dr. Durene Cal       Lives with husband in a three story home, no issue with stairs.    Highest level of education is an associates.       She exercises regularly, leads an active lifestyle, has a healthy diet, and is very socially engaged. Self-described "HSP: highly sensitive person" and therefore tends to worry more about symptoms than others might.      Hobbies: enjoys time with friends, time on front porch with friends during covid, park daily, family time   Social Determinants of Health   Financial Resource Strain: Low Risk  (02/01/2022)   Overall Financial Resource Strain (CARDIA)    Difficulty of Paying Living Expenses: Not hard at all  Food Insecurity: Low Risk  (07/29/2022)   [facility]   Hunger Vital Sign    Worried About Running Out of Food in the Last Year: Never true    Ran Out of Food in the Last Year: Never true  Transportation Needs: Not on file (07/29/2022)  Physical Activity: Sufficiently Active (02/01/2022)   Exercise Vital Sign    Days of Exercise per Week: 7 days    Minutes of Exercise per Session: 60 min  Stress: No Stress Concern Present (02/01/2022)   [employer] of Occupational Health - Occupational Stress Questionnaire    Feeling of Stress : Not at all  Social Connections: Not on file  Intimate Partner Violence: Not on file    Family History:   The patient's family history includes Breast cancer (age of onset: 28) in her maternal aunt; Diabetes in her maternal aunt; Failure to thrive in her mother; Heart disease in her maternal grandfather and maternal grandmother; Hyperlipidemia in her maternal grandfather; Hypertension in her father and paternal grandfather; Liver cancer in her father. There is no history of Colon cancer  or Thyroid disease.    ROS:  Please see the history of present illness.  All  other ROS reviewed and negative.     Physical Exam/Data:   Vitals:   02/02/23 1958 02/02/23 2000 02/02/23 2030 02/02/23 2300  BP:    133/75  Pulse:    69  Resp:    19  Temp:   98.2 F (36.8 C)   TempSrc:   Oral   SpO2:    99%  Weight: 66.2 kg 70.9 kg    Height:  5\' 5"  (1.651 m)     No intake or output data in the 24 hours ending 02/02/23 2349    02/02/2023    8:00 PM 02/02/2023    7:58 PM 01/12/2023   10:43 AM  Last 3 Weights  Weight (lbs) 156 lb 4.9 oz 146 lb 153 lb 6.4 oz  Weight (kg) 70.9 kg 66.225 kg 69.582 kg     Body mass index is 26.01 kg/m.  General:  Well nourished, well developed, in no acute distress HEENT: normal Neck: no JVD Vascular: No carotid bruits; Distal pulses 2+ bilaterally   Cardiac:  normal S1, S2; RRR; no murmur  Lungs:  clear to auscultation bilaterally, no wheezing, rhonchi or rales  Abd: soft, nontender, no hepatomegaly  Ext: no edema Musculoskeletal:  No deformities, BUE and BLE strength normal and equal Skin: warm and dry  Neuro:  CNs 2-12 intact, no focal abnormalities noted Psych:  Normal affect    EKG:  The ECG that was done on 02/02/23 (19:23 hrs) was personally reviewed and demonstrates NSR with RBBB; LAD.  ECG done 02/02/23 (18:57 hrs) NSR; inferior lead ST-elevations concerning for STEMI; anterior ST-depressions with T-wave inversion  Relevant CV Studies: # Echocardiogram 01/18/13 Study Conclusions  - Left ventricle: The cavity size was normal. Wall thickness    was normal. Systolic function was normal. The estimated    ejection fraction was in the range of 60% to 65%. Wall    motion was normal; there were no regional wall motion    abnormalities. Doppler parameters are consistent with    abnormal left ventricular relaxation (grade 1 diastolic    dysfunction). The E/e' ratio is <10, suggesting normal LV    filling pressure.  - Left atrium: The atrium was  normal in size.  - Inferior vena cava: The vessel was normal in size; the    respirophasic diameter changes were in the normal range (=    50%); findings are consistent with normal central venous    pressure.   Laboratory Data:  High Sensitivity Troponin:   Recent Labs  Lab 02/02/23 1856 02/02/23 2123  TROPONINIHS 4 255*      Chemistry Recent Labs  Lab 02/02/23 1856 02/02/23 1908  NA 137 140  K 4.0 3.4*  CL 102 105  CO2 23  --   GLUCOSE 152* 161*  BUN 17 16  CREATININE 0.96 1.00  CALCIUM 9.8  --   GFRNONAA >60  --   ANIONGAP 12  --     Recent Labs  Lab 02/02/23 1856  PROT 8.7*  ALBUMIN 4.7  AST 41  ALT 19  ALKPHOS 82  BILITOT 0.9   Lipids  Recent Labs  Lab 02/02/23 1856  CHOL 291*  TRIG 215*  HDL 91  LDLCALC 157*  CHOLHDL 3.2   Hematology Recent Labs  Lab 02/02/23 1856 02/02/23 1908  WBC 5.3  --   RBC 4.77  --   HGB 14.2 13.6  HCT 43.8 40.0  MCV 91.8  --   MCH 29.8  --   MCHC  32.4  --   RDW 12.5  --   PLT 260  --    Thyroid No results for input(s): "TSH", "FREET4" in the last 168 hours. BNPNo results for input(s): "BNP", "PROBNP" in the last 168 hours.  DDimer No results for input(s): "DDIMER" in the last 168 hours.   Radiology/Studies:  DG Chest Port 1 View  Result Date: 02/02/2023 CLINICAL DATA:  Chest pain EXAM: PORTABLE CHEST 1 VIEW COMPARISON:  Chest x-ray 01/09/2022 FINDINGS: The heart size and mediastinal contours are within normal limits. Both lungs are clear. The visualized skeletal structures are unremarkable. IMPRESSION: No active disease. Electronically Signed   By: Darliss Cheney M.D.   On: 02/02/2023 21:35     Assessment and Plan:   Chest pain: Patient with acute chest pain in the setting of ST-elevations now resolved with HS-troponin going from 4 ng/L to 255 ng/L.  At this time, definition for NSTEMI-ACS with possible spontaneous recanalization of initially potentially totally occluded vessel.  Chest pain free and  hemodynamically stable.  Patient was provided high-dose aspirin and started on heparin drip in the emergency department. --Continue aspirin and heparin drip. --NPO after midnight for coronary angiography. --Get echocardiogram. --Increase rosuvastatin to 20 mg daily (patient admits to using sparingly). --Start IV fluids for renal protection.    Hypercholesteremia: Patient with prior LDL levels in a range concerning for possible familial hypercholesteremia prior.  Reports using rosuvastatin sparingly.  Here with NSTEMI-ACS.  Taking low-dose rosuvastatin prior. --Increase rosuvastatin as above.    Risk Assessment/Risk Scores:    TIMI Risk Score for Unstable Angina or Non-ST Elevation MI:   The patient's TIMI risk score is 4, which indicates a 20% risk of all cause mortality, new or recurrent myocardial infarction or need for urgent revascularization in the next 14 days.      Code Status: Full Code  Severity of Illness: The appropriate patient status for this patient is INPATIENT. Inpatient status is judged to be reasonable and necessary in order to provide the required intensity of service to ensure the patient's safety. The patient's presenting symptoms, physical exam findings, and initial radiographic and laboratory data in the context of their chronic comorbidities is felt to place them at high risk for further clinical deterioration. Furthermore, it is not anticipated that the patient will be medically stable for discharge from the hospital within 2 midnights of admission.   * I certify that at the point of admission it is my clinical judgment that the patient will require inpatient hospital care spanning beyond 2 midnights from the point of admission due to high intensity of service, high risk for further deterioration and high frequency of surveillance required.*   For questions or updates, please contact [facility] Please consult [email or web address] for contact info under      Signed, Judie Grieve, MD  02/02/2023 11:49 PM

## 2023-02-02 NOTE — ED Provider Notes (Signed)
[place] EMERGENCY DEPARTMENT AT [facility] Provider Note   CSN: [id number] Arrival date & time: 02/02/23  1841     History  Chief Complaint  Patient presents with   Chest Pain    Amanda [name] is a 72 y.o. female.   Chest Pain Patient presented POV with husband.  Reportedly was at a winery listening to music and began complaining of heart racing and chest pain.  Now somewhat decreased responsive.  Reportedly does get this with anxiety.  No coronary artery disease history.  Initial EKG shows inferior ST changes.  Taken emergently back to the room.     Home Medications Prior to Admission medications   Medication Sig Start Date End Date Taking? Authorizing Provider  Apple Cider Vinegar 188 MG CAPS Take by mouth.    [provider]  bisacodyl (DULCOLAX) 5 MG EC tablet Take 5 mg by mouth daily as needed for moderate constipation.    [provider]  CALCIUM PO Take by mouth. Takes 2    [provider]  cholecalciferol (VITAMIN D3) 25 MCG (1000 UT) tablet Take 1,000 Units by mouth daily.    [provider]  clonazePAM (KLONOPIN) 0.5 MG tablet TAKE 1 TABLET BY MOUTH EVERYDAY AT BEDTIME 10/05/22   Shelva Majestic, MD  CO ENZYME Q-10 PO Take by mouth daily.     [provider]  Cyanocobalamin (VITAMIN B-12 ER PO) Take 5,000 mcg by mouth daily.    [provider]  escitalopram (LEXAPRO) 10 MG tablet Take 10 mg by mouth daily. 08/25/19   [provider]  loratadine (CLARITIN) 10 MG tablet Take 10 mg by mouth daily.    [provider]  Melatonin 3 MG CAPS Take by mouth at bedtime as needed.     [provider]  metoprolol succinate (TOPROL-XL) 25 MG 24 hr tablet TAKE 1 TABLET BY MOUTH DAILY. TAKE WITH OR IMMEDIATELY FOLLOWING A MEAL. 02/11/22   Marykay Lex, MD  Multiple Vitamin (MULTIVITAMIN) tablet Take 1 tablet by mouth daily.    [provider]  NON FORMULARY Bausch and lomb  ointment-Muro-//128, 5%ointment- Each eye nightly    [provider]  rOPINIRole (REQUIP) 0.25 MG tablet TAKE 1 TABLET BY MOUTH EVERYDAY AT BEDTIME 10/27/22   Shelva Majestic, MD  rOPINIRole (REQUIP) 0.5 MG tablet Take 1 tablet (0.5 mg total) by mouth at bedtime. 03/29/22   Shelva Majestic, MD  rosuvastatin (CRESTOR) 10 MG tablet Take 0.5 tablets (5 mg total) by mouth daily. 03/07/22   Shelva Majestic, MD  valACYclovir (VALTREX) 500 MG tablet One tab bid as needed for outbreak 02/25/22   Shelva Majestic, MD      Allergies    Ambien [zolpidem tartrate], Cortisone, Eszopiclone and related, Levofloxacin, Zolpidem, Dicloxacillin, Dilantin [phenytoin sodium extended], Erythromycin, Avelox [moxifloxacin hcl in nacl], Eszopiclone, Influenza virus vaccine, Moxifloxacin, Nefazodone, Other, Phenytoin, Trazodone, Nitrofuran derivatives, Phenytoin sodium extended, and Trazodone and nefazodone    Review of Systems   Review of Systems  Cardiovascular:  Positive for chest pain.    Physical Exam Updated Vital Signs BP (!) 167/83 (BP Location: Left Arm)   Pulse 72   Temp 98.8 F (37.1 C) (Oral)   Resp (!) 23   LMP  (LMP Unknown)   SpO2 100%  Physical Exam Vitals reviewed.  Cardiovascular:     Rate and Rhythm: Normal rate and regular rhythm.  Pulmonary:     Breath sounds: No wheezing or  rhonchi.  Chest:     Chest wall: No tenderness.  Abdominal:     Tenderness: There is no abdominal tenderness.  Neurological:     Comments: Somewhat sedate but does arouse to stimulation.     ED Results / Procedures / Treatments   Labs (all labs ordered are listed, but only abnormal results are displayed) Labs Reviewed  CBG MONITORING, ED - Abnormal; Notable for the following components:      Result Value   Glucose-Capillary 144 (*)    All other components within normal limits  I-STAT CHEM 8, ED - Abnormal; Notable for the following components:   Potassium 3.4 (*)    Glucose, Bld 161 (*)     Calcium, Ion 1.12 (*)    All other components within normal limits  CBC WITH DIFFERENTIAL/PLATELET  PROTIME-INR  APTT  HEMOGLOBIN A1C  COMPREHENSIVE METABOLIC PANEL  LIPID PANEL  I-STAT CG4 LACTIC ACID, ED  TROPONIN I (HIGH SENSITIVITY)    EKG EKG Interpretation Date/Time:  Thursday February 02 2023 19:05:43 EDT Ventricular Rate:  72 PR Interval:  147 QRS Duration:  131 QT Interval:  426 QTC Calculation: 467 R Axis:   -48  Text Interpretation: Sinus rhythm RBBB and LAFB ST elevation, consider inferior injury inferior ST elevation improved Confirmed by Benjiman Core [phone]) on 02/02/2023 7:11:25 PM  Radiology MM 3D SCREENING MAMMOGRAM BILATERAL BREAST  Result Date: 02/02/2023 CLINICAL DATA:  Screening. EXAM: DIGITAL SCREENING BILATERAL MAMMOGRAM WITH TOMOSYNTHESIS AND CAD TECHNIQUE: Bilateral screening digital craniocaudal and mediolateral oblique mammograms were obtained. Bilateral screening digital breast tomosynthesis was performed. The images were evaluated with computer-aided detection. COMPARISON:  Previous exam(s). ACR Breast Density Category b: There are scattered areas of fibroglandular density. FINDINGS: There are no findings suspicious for malignancy. IMPRESSION: No mammographic evidence of malignancy. A result letter of this screening mammogram will be mailed directly to the patient. RECOMMENDATION: Screening mammogram in one year. (Code:SM-B-01Y) BI-RADS CATEGORY  1: Negative. Electronically Signed   By: Edwin Cap M.D.   On: 02/02/2023 15:43    Procedures Procedures    Medications Ordered in ED Medications  0.9 %  sodium chloride infusion ( Intravenous New Bag/Given 02/02/23 1935)  aspirin chewable tablet 324 mg (324 mg Oral Given 02/02/23 1915)  heparin injection 4,000 Units (4,000 Units Intravenous Given 02/02/23 1935)    ED Course/ Medical Decision Making/ A&P                                 Medical Decision Making Amount and/or Complexity of  Data Reviewed Labs: ordered. Radiology: ordered. ECG/medicine tests: ordered.  Risk OTC drugs. Prescription drug management. Decision regarding hospitalization.   Patient with chest pain.  Dynamic EKG changes with initially inferior ST elevation.  However that has resolved.  Patient states she is pain-free now.  Reviewed cardiology note.   1910 Have paged STEMI Dr. [place] 1930 discussed with Dr. [place].  Since she is pain-free and EKG is resolved likely will be more delayed cath, however he is going to look at the EKGs and call back.  Discussed with Dr. [place].  Will admit and transfer to [facility].  If pain returns we will need more urgent catheterization.  Discussed with Dr. Servando Salina, who will admit the patient.  However she requested I put the admission orders in.   CRITICAL CARE Performed by: Benjiman Core Total critical care time: 40 minutes Critical care time was exclusive of  separately billable procedures and treating other patients. Critical care was necessary to treat or prevent imminent or life-threatening deterioration. Critical care was time spent personally by me on the following activities: development of treatment plan with patient and/or surrogate as well as nursing, discussions with consultants, evaluation of patient's response to treatment, examination of patient, obtaining history from patient or surrogate, ordering and performing treatments and interventions, ordering and review of laboratory studies, ordering and review of radiographic studies, pulse oximetry and re-evaluation of patient's condition.         Final Clinical Impression(s) / ED Diagnoses Final diagnoses:  ACS (acute coronary syndrome) [facility])    Rx / DC Orders ED Discharge Orders     None         Benjiman Core, MD 02/02/23 1958

## 2023-02-02 NOTE — ED Triage Notes (Signed)
Pt arrived POV with husband. He reports chest pain that started 1 hour PTA. Husband states they were sitting down listening to music and sudden chest pain started. Patient hypertensive  in triage.Patient lethargic, not answering questions. Taken straight back to [facility]. MD Pickering at bedside for eval.

## 2023-02-02 NOTE — Progress Notes (Signed)
ANTICOAGULATION CONSULT NOTE - Initial Consult  Pharmacy Consult for Heparin Indication: chest pain/ACS  Allergies  Allergen Reactions   Ambien [Zolpidem Tartrate] Other (See Comments)    Causes Neurological problems with very bad dizziness, pains, disorientation   Cortisone Other (See Comments)    REACTION: "mania" post ESI   Eszopiclone And Related Other (See Comments)    Causes Neurological problems with very bad dizziness, pains, disorientation   Levofloxacin Other (See Comments)    Muscle spasms and jerking movements   Zolpidem Other (See Comments)    Causes Neurological problems with very bad dizziness, pains, disorientation   Dicloxacillin Other (See Comments)    Stomach problems, swelling of tongue   Dilantin [Phenytoin Sodium Extended] Hives and Itching   Erythromycin Other (See Comments)    Tears stomach up   Avelox [Moxifloxacin Hcl In Nacl]     Pt unsure of reaction.   Eszopiclone Other (See Comments)   Influenza Virus Vaccine Other (See Comments)   Moxifloxacin Other (See Comments)   Nefazodone Other (See Comments)   Other     Celiac disease   Phenytoin Other (See Comments)   Trazodone Other (See Comments)   Nitrofuran Derivatives Other (See Comments)    unknown   Phenytoin Sodium Extended Rash   Trazodone And Nefazodone Other (See Comments)    dizzy    Patient Measurements:   Heparin Dosing Weight:  Previous weight 153 lbs, charted on 01/12/23 Patient states weight 146 lbs  Vital Signs: Temp: 98.8 F (37.1 C) (09/26 1946) Temp Source: Oral (09/26 1946) BP: 167/83 (09/26 1935) Pulse Rate: 72 (09/26 1935)  Labs: Recent Labs    02/02/23 1856 02/02/23 1908  HGB 14.2 13.6  HCT 43.8 40.0  PLT 260  --   APTT 29  --   LABPROT 12.2  --   INR 0.9  --   CREATININE  --  1.00    CrCl cannot be calculated (Unknown ideal weight.).   Medical History: Past Medical History:  Diagnosis Date   Anemia    Anxiety    Celiac disease    Chronic  constipation    COVID 02/12/2021   COVID-19 virus vaccine not available    Erythema migrans (Lyme disease) 12/17/2021   GERD (gastroesophageal reflux disease)    occasionally (per pt takes apple cider vinager)   Heart palpitations    cardiologist-- dr Herbie [place]--- event monitor 08-24-2011 epic ;  nuclear stress test 03-16-2011 (epic) normal w/ no ischemia, ef 65%;  echo 01-18-2013  ef 60-65%, G1DD   Hiatal hernia    History of subdural hematoma    12/ 23/ 2010  s/p  craniotomy w/ hematoma evacuation (pt had a fall w/ concussion)  per pt no residual   History of thyroid cancer followed by dr Pollyann Kennedy---   s/p  left thyroidectomy --- per pt no radiation and no recurrence   Hyperlipidemia    IBS (irritable bowel syndrome)    Immunization, single disease 06/09/2019   1st dose moderma vaccine administered   Insomnia    OA (osteoarthritis)    Osteopenia, T score -2.1 FRAX 9.4%/1.3% stable from prior DEXA 06/2015   Restless leg syndrome    Right thyroid nodule    followed by dr Pollyann Kennedy--- last ultrasound in epic 03-30-2018 stable , to bx   STD (sexually transmitted disease), HSV    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   No prior to admit anticoagulation (med history pending). Baseline labs:  SCr 1, CBC: Hgb and Plt WNL, aPTT 29, INR 0.9  Goal of Therapy:  Heparin level 0.3-0.7 units/ml Monitor platelets by anticoagulation protocol: Yes   Plan:  Heparin 4000 units bolus IV x 1 per MD already ordered and given.  Start heparin IV infusion at 800 units/hr Heparin level 8 hours after starting Daily heparin level and CBC   Lynann Beaver PharmD, BCPS [facility] main pharmacy [phone] 02/02/2023 7:56 PM

## 2023-02-02 NOTE — Progress Notes (Addendum)
ANTICOAGULATION CONSULT NOTE - Initial Consult  Pharmacy Consult for Heparin Indication: chest pain/ACS  Allergies  Allergen Reactions   Ambien [Zolpidem Tartrate] Other (See Comments)    Causes Neurological problems with very bad dizziness, pains, disorientation   Cortisone Other (See Comments)    REACTION: "mania" post ESI   Eszopiclone And Related Other (See Comments)    Causes Neurological problems with very bad dizziness, pains, disorientation   Levofloxacin Other (See Comments)    Muscle spasms and jerking movements   Zolpidem Other (See Comments)    Causes Neurological problems with very bad dizziness, pains, disorientation   Dicloxacillin Other (See Comments)    Stomach problems, swelling of tongue   Dilantin [Phenytoin Sodium Extended] Hives and Itching   Erythromycin Other (See Comments)    Tears stomach up   Avelox [Moxifloxacin Hcl In Nacl]     Pt unsure of reaction.   Eszopiclone Other (See Comments)   Influenza Virus Vaccine Other (See Comments)   Moxifloxacin Other (See Comments)   Nefazodone Other (See Comments)   Other     Celiac disease   Phenytoin Other (See Comments)   Trazodone Other (See Comments)   Nitrofuran Derivatives Other (See Comments)    unknown   Phenytoin Sodium Extended Rash   Trazodone And Nefazodone Other (See Comments)    dizzy    Patient Measurements: Height: 5\' 5"  (165.1 cm) Weight: 70.9 kg (156 lb 4.9 oz) IBW/kg (Calculated) : 57 Heparin Dosing Weight: 71 kg  Vital Signs: Temp: 98.2 F (36.8 C) (09/26 2030) Temp Source: Oral (09/26 2030) BP: 167/83 (09/26 1935) Pulse Rate: 72 (09/26 1935)  Labs: Recent Labs    02/02/23 1856 02/02/23 1908  HGB 14.2 13.6  HCT 43.8 40.0  PLT 260  --   APTT 29  --   LABPROT 12.2  --   INR 0.9  --   CREATININE 0.96 1.00  TROPONINIHS 4  --     Estimated Creatinine Clearance: 50.3 mL/min (by C-G formula based on SCr of 1 mg/dL).   Medical History: Past Medical History:  Diagnosis  Date   Anemia    Anxiety    Celiac disease    Chronic constipation    COVID 02/12/2021   COVID-19 virus vaccine not available    Erythema migrans (Lyme disease) 12/17/2021   GERD (gastroesophageal reflux disease)    occasionally (per pt takes apple cider vinager)   Heart palpitations    cardiologist-- dr Herbie Baltimore--- event monitor 08-24-2011 epic ;  nuclear stress test 03-16-2011 (epic) normal w/ no ischemia, ef 65%;  echo 01-18-2013  ef 60-65%, G1DD   Hiatal hernia    History of subdural hematoma    12/ 23/ 2010  s/p  craniotomy w/ hematoma evacuation (pt had a fall w/ concussion)  per pt no residual   History of thyroid cancer followed by dr Pollyann Kennedy---   s/p  left thyroidectomy --- per pt no radiation and no recurrence   Hyperlipidemia    IBS (irritable bowel syndrome)    Immunization, single disease 06/09/2019   1st dose moderma vaccine administered   Insomnia    OA (osteoarthritis)    Osteopenia, T score -2.1 FRAX 9.4%/1.3% stable from prior DEXA 06/2015   Restless leg syndrome    Right thyroid nodule    followed by dr Pollyann Kennedy--- last ultrasound in epic 03-30-2018 stable , to bx   STD (sexually transmitted disease), HSV    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   No prior to admit anticoagulation (med history pending). Baseline labs:  SCr 1, CBC: Hgb and Plt WNL, aPTT 29, INR 0.9  9/26 PM update: Transfer from [facility] as STEMI -- hs-cTn 4, code STEMI cancelled on arrival and not taken to cath.  Received ASA load and 4000 unit heparin bolus prior to transfer however heparin infusion was not started.    Goal of Therapy:  Heparin level 0.3-0.7 units/ml Monitor platelets by anticoagulation protocol: Yes   Plan:  Start  heparin IV infusion at 800 units/hr (RN started @2100 ) 8 hour heparin level  Daily heparin level and CBC F/u duration of heparin   Addendum: Heparin consult/order discontinued and re-ordered.  Confirmed with RN that heparin was not stopped.  Continue heparin @800  units/hr  Trixie Rude, PharmD Clinical Pharmacist 02/02/2023  9:01 PM

## 2023-02-02 NOTE — ED Notes (Signed)
Called [facility] for code stemi at 19:20

## 2023-02-02 NOTE — ED Notes (Signed)
Patient transported by [facility] to [facility] at this time.

## 2023-02-02 NOTE — ED Notes (Signed)
Report given to RN at [facility].  All questions answered.

## 2023-02-03 ENCOUNTER — Other Ambulatory Visit (HOSPITAL_COMMUNITY): Payer: Medicare Other

## 2023-02-03 ENCOUNTER — Inpatient Hospital Stay (HOSPITAL_COMMUNITY): Payer: Medicare Other

## 2023-02-03 ENCOUNTER — Inpatient Hospital Stay (HOSPITAL_COMMUNITY)
Admission: EM | Disposition: A | Discharge status: Home / Self Care | Payer: Self-pay | Source: Home / Self Care | Attending: Internal Medicine

## 2023-02-03 DIAGNOSIS — E876 Hypokalemia: Secondary | ICD-10-CM | POA: Diagnosis not present

## 2023-02-03 DIAGNOSIS — I214 Non-ST elevation (NSTEMI) myocardial infarction: Secondary | ICD-10-CM

## 2023-02-03 DIAGNOSIS — I2510 Atherosclerotic heart disease of native coronary artery without angina pectoris: Secondary | ICD-10-CM

## 2023-02-03 DIAGNOSIS — E785 Hyperlipidemia, unspecified: Secondary | ICD-10-CM

## 2023-02-03 DIAGNOSIS — I10 Essential (primary) hypertension: Secondary | ICD-10-CM

## 2023-02-03 DIAGNOSIS — I249 Acute ischemic heart disease, unspecified: Secondary | ICD-10-CM | POA: Diagnosis not present

## 2023-02-03 HISTORY — PX: LEFT HEART CATH AND CORONARY ANGIOGRAPHY: CATH118249

## 2023-02-03 HISTORY — PX: TRANSTHORACIC ECHOCARDIOGRAM: SHX275

## 2023-02-03 LAB — BASIC METABOLIC PANEL
Anion gap: 11 (ref 5–15)
BUN: 13 mg/dL (ref 8–23)
CO2: 22 mmol/L (ref 22–32)
Calcium: 8.9 mg/dL (ref 8.9–10.3)
Chloride: 106 mmol/L (ref 98–111)
Creatinine, Ser: 0.8 mg/dL (ref 0.44–1.00)
GFR, Estimated: >60 mL/min (ref >60)
Glucose, Bld: 107 mg/dL — ABNORMAL HIGH (ref 70–99)
Potassium: 3.7 mmol/L (ref 3.5–5.1)
Sodium: 139 mmol/L (ref 135–145)

## 2023-02-03 LAB — ECHOCARDIOGRAM COMPLETE
Area-P 1/2: 2.37 cm2
Height: 65 in
Weight: 2500.9 [oz_av]

## 2023-02-03 LAB — LIPID PANEL
Cholesterol: 252 mg/dL — ABNORMAL HIGH (ref 0–200)
HDL: 76 mg/dL (ref >40)
LDL Cholesterol: 163 mg/dL — ABNORMAL HIGH (ref 0–99)
Total CHOL/HDL Ratio: 3.3 {ratio}
Triglycerides: 66 mg/dL (ref <150)
VLDL: 13 mg/dL (ref 0–40)

## 2023-02-03 LAB — CBC
HCT: 38.2 % (ref 36.0–46.0)
Hemoglobin: 12.6 g/dL (ref 12.0–15.0)
MCH: 30.7 pg (ref 26.0–34.0)
MCHC: 33 g/dL (ref 30.0–36.0)
MCV: 92.9 fL (ref 80.0–100.0)
Platelets: 220 10*3/uL (ref 150–400)
RBC: 4.11 MIL/uL (ref 3.87–5.11)
RDW: 12.7 % (ref 11.5–15.5)
WBC: 6.7 10*3/uL (ref 4.0–10.5)
nRBC: 0 % (ref 0.0–0.2)

## 2023-02-03 LAB — D-DIMER, QUANTITATIVE: D-Dimer, Quant: 0.38 ug{FEU}/mL (ref 0.00–0.50)

## 2023-02-03 LAB — HEPARIN LEVEL (UNFRACTIONATED): Heparin Unfractionated: 0.23 [IU]/mL — ABNORMAL LOW (ref 0.30–0.70)

## 2023-02-03 SURGERY — LEFT HEART CATH AND CORONARY ANGIOGRAPHY
Anesthesia: LOCAL

## 2023-02-03 MED ORDER — LOSARTAN POTASSIUM 25 MG PO TABS
25.0000 mg | ORAL_TABLET | Freq: Every day | ORAL | Status: DC
  Administered 2023-02-03 – 2023-02-04 (×2): 25 mg via ORAL
  Filled 2023-02-03 (×2): qty 1

## 2023-02-03 MED ORDER — ACETAMINOPHEN 325 MG PO TABS: 650.0000 mg | ORAL_TABLET | ORAL | Status: DC | PRN

## 2023-02-03 MED ORDER — MIDAZOLAM HCL 2 MG/2ML IJ SOLN
INTRAMUSCULAR | Status: DC | PRN
  Administered 2023-02-03: 1 mg via INTRAVENOUS

## 2023-02-03 MED ORDER — ASPIRIN 81 MG PO CHEW
81.0000 mg | CHEWABLE_TABLET | ORAL | Status: AC
  Administered 2023-02-03: 81 mg via ORAL

## 2023-02-03 MED ORDER — FENTANYL CITRATE (PF) 100 MCG/2ML IJ SOLN
INTRAMUSCULAR | Status: AC
  Filled 2023-02-03: qty 2

## 2023-02-03 MED ORDER — VERAPAMIL HCL 2.5 MG/ML IV SOLN
INTRAVENOUS | Status: AC
  Filled 2023-02-03: qty 2

## 2023-02-03 MED ORDER — HEPARIN SODIUM (PORCINE) 1000 UNIT/ML IJ SOLN
INTRAMUSCULAR | Status: AC
  Filled 2023-02-03: qty 10

## 2023-02-03 MED ORDER — CHLORHEXIDINE GLUCONATE CLOTH 2 % EX PADS
6.0000 | MEDICATED_PAD | Freq: Every day | CUTANEOUS | Status: DC
  Administered 2023-02-03 – 2023-02-04 (×2): 6 via TOPICAL

## 2023-02-03 MED ORDER — HEPARIN (PORCINE) IN NACL 1000-0.9 UT/500ML-% IV SOLN
INTRAVENOUS | Status: DC | PRN
  Administered 2023-02-03 (×2): 500 mL

## 2023-02-03 MED ORDER — HYDRALAZINE HCL 20 MG/ML IJ SOLN: 10.0000 mg | INTRAMUSCULAR | Status: AC | PRN

## 2023-02-03 MED ORDER — ALUM & MAG HYDROXIDE-SIMETH 200-200-20 MG/5ML PO SUSP
30.0000 mL | ORAL | Status: DC | PRN
  Administered 2023-02-03: 30 mL via ORAL
  Filled 2023-02-03: qty 30

## 2023-02-03 MED ORDER — SODIUM CHLORIDE 0.9 % WEIGHT BASED INFUSION: 1.0000 mL/kg/h | INTRAVENOUS | Status: DC

## 2023-02-03 MED ORDER — SODIUM CHLORIDE 0.9 % IV SOLN: INTRAVENOUS | Status: AC

## 2023-02-03 MED ORDER — LIDOCAINE HCL (PF) 1 % IJ SOLN
INTRAMUSCULAR | Status: AC
  Filled 2023-02-03: qty 30

## 2023-02-03 MED ORDER — ONDANSETRON HCL 4 MG/2ML IJ SOLN: 4.0000 mg | Freq: Four times a day (QID) | INTRAMUSCULAR | Status: DC | PRN

## 2023-02-03 MED ORDER — SODIUM CHLORIDE 0.9% FLUSH
3.0000 mL | Freq: Two times a day (BID) | INTRAVENOUS | Status: DC
  Administered 2023-02-03 – 2023-02-04 (×2): 3 mL via INTRAVENOUS

## 2023-02-03 MED ORDER — SODIUM CHLORIDE 0.9 % IV SOLN: 250.0000 mL | INTRAVENOUS | Status: DC | PRN

## 2023-02-03 MED ORDER — POTASSIUM CHLORIDE CRYS ER 20 MEQ PO TBCR
40.0000 meq | EXTENDED_RELEASE_TABLET | Freq: Once | ORAL | Status: AC
  Administered 2023-02-03: 40 meq via ORAL
  Filled 2023-02-03: qty 2

## 2023-02-03 MED ORDER — MIDAZOLAM HCL 2 MG/2ML IJ SOLN
INTRAMUSCULAR | Status: AC
  Filled 2023-02-03: qty 2

## 2023-02-03 MED ORDER — SODIUM CHLORIDE 0.9% FLUSH: 3.0000 mL | INTRAVENOUS | Status: DC | PRN

## 2023-02-03 MED ORDER — ASPIRIN 81 MG PO CHEW: 81.0000 mg | CHEWABLE_TABLET | Freq: Every day | ORAL | Status: DC

## 2023-02-03 MED ORDER — HEPARIN SODIUM (PORCINE) 1000 UNIT/ML IJ SOLN
INTRAMUSCULAR | Status: DC | PRN
  Administered 2023-02-03: 3500 [IU] via INTRAVENOUS

## 2023-02-03 MED ORDER — VERAPAMIL HCL 2.5 MG/ML IV SOLN
INTRAVENOUS | Status: DC | PRN
  Administered 2023-02-03: 10 mL via INTRA_ARTERIAL

## 2023-02-03 MED ORDER — ROSUVASTATIN CALCIUM 20 MG PO TABS
40.0000 mg | ORAL_TABLET | Freq: Every day | ORAL | Status: DC
  Administered 2023-02-03 – 2023-02-04 (×2): 40 mg via ORAL
  Filled 2023-02-03 (×2): qty 2

## 2023-02-03 MED ORDER — IOHEXOL 350 MG/ML SOLN
INTRAVENOUS | Status: DC | PRN
  Administered 2023-02-03: 45 mL

## 2023-02-03 MED ORDER — LABETALOL HCL 5 MG/ML IV SOLN: 10.0000 mg | INTRAVENOUS | Status: AC | PRN

## 2023-02-03 MED ORDER — ASPIRIN 81 MG PO CHEW: 81.0000 mg | CHEWABLE_TABLET | ORAL | Status: DC

## 2023-02-03 MED ORDER — SODIUM CHLORIDE 0.9 % WEIGHT BASED INFUSION: 3.0000 mL/kg/h | INTRAVENOUS | Status: DC

## 2023-02-03 MED ORDER — SODIUM CHLORIDE 0.9 % WEIGHT BASED INFUSION
3.0000 mL/kg/h | INTRAVENOUS | Status: DC
  Administered 2023-02-03: 3 mL/kg/h via INTRAVENOUS

## 2023-02-03 MED ORDER — FENTANYL CITRATE (PF) 100 MCG/2ML IJ SOLN
INTRAMUSCULAR | Status: DC | PRN
  Administered 2023-02-03: 25 ug via INTRAVENOUS

## 2023-02-03 SURGICAL SUPPLY — 10 items
CATH INFINITI AMBI 5FR TG (CATHETERS) IMPLANT
CATH INFINITI JR4 5F (CATHETERS) IMPLANT
DEVICE RAD COMP TR BAND LRG (VASCULAR PRODUCTS) IMPLANT
ELECT DEFIB PAD ADLT CADENCE (PAD) IMPLANT
GLIDESHEATH SLEND SS 6F .021 (SHEATH) IMPLANT
GUIDEWIRE INQWIRE 1.5J.035X260 (WIRE) IMPLANT
INQWIRE 1.5J .035X260CM (WIRE) ×1
PACK CARDIAC CATHETERIZATION (CUSTOM PROCEDURE TRAY) ×1 IMPLANT
SET ATX-X65L (MISCELLANEOUS) IMPLANT
SHEATH PROBE COVER 6X72 (BAG) IMPLANT

## 2023-02-03 NOTE — Plan of Care (Signed)

## 2023-02-03 NOTE — Interval H&P Note (Signed)
Cath Lab Visit (complete for each Cath Lab visit)  Clinical Evaluation Leading to the Procedure:   ACS: Yes.    Non-ACS:    Anginal Classification: CCS III  Anti-ischemic medical therapy: Minimal Therapy (1 class of medications)  Non-Invasive Test Results: No non-invasive testing performed  Prior CABG: No previous CABG      History and Physical Interval Note:  02/03/2023 1:07 PM  Amanda [name]  has presented today for surgery, with the diagnosis of NSTEMI.  The various methods of treatment have been discussed with the patient and family. After consideration of risks, benefits and other options for treatment, the patient has consented to  Procedure(s): LEFT HEART CATH AND CORONARY ANGIOGRAPHY (N/A) as a surgical intervention.  The patient's history has been reviewed, patient examined, no change in status, stable for surgery.  I have reviewed the patient's chart and labs.  Questions were answered to the patient's satisfaction.     Nicki Guadalajara

## 2023-02-03 NOTE — TOC Initial Note (Signed)
Transition of Care [facility]) - Initial/Assessment Note    Patient Details  Name: Amanda [name] MRN: [id number] Date of Birth: [birth year]  [facility] CM/SW Contact:    Elliot Cousin, RN Phone Number: [phone] 02/03/2023, 12:05 PM  Clinical Narrative:   CM spoke to pt and she wanted to rest. States her husband would be able to answer questions for CM. States she was independent pta. Will continue to follow as pt progresses.                 Expected Discharge Plan: Home/Self Care Barriers to Discharge: Continued Medical Work up   Patient Goals and CMS Choice Patient states their goals for this hospitalization and ongoing recovery are:: wants to recover her energy          Expected Discharge Plan and Services   Discharge Planning Services: CM Consult   Living arrangements for the past 2 months: Single Family Home                                      Prior Living Arrangements/Services Living arrangements for the past 2 months: Single Family Home Lives with:: Spouse Patient language and need for interpreter reviewed:: Yes Do you feel safe going back to the place where you live?: Yes      Need for Family Participation in Patient Care: No (Comment) Care giver support system in place?: Yes (comment)   Criminal Activity/Legal Involvement Pertinent to Current Situation/Hospitalization: No - Comment as needed  Activities of Daily Living   ADL Screening (condition at time of admission) Does the patient have a NEW difficulty with bathing/dressing/toileting/self-feeding that is expected to last >3 days?: No Does the patient have a NEW difficulty with getting in/out of bed, walking, or climbing stairs that is expected to last >3 days?: No Does the patient have a NEW difficulty with communication that is expected to last >3 days?: No Is the patient deaf or have difficulty hearing?: No Does the patient have difficulty seeing, even when wearing  glasses/contacts?: No Does the patient have difficulty concentrating, remembering, or making decisions?: No  Permission Sought/Granted Permission sought to share information with : Case Manager, Family Supports, PCP Permission granted to share information with : Yes, Verbal Permission Granted  Share Information with NAME: Soren Langworthy     Permission granted to share info w Relationship: husband  Permission granted to share info w Contact Information: [phone]  Emotional Assessment Appearance:: Appears stated age Attitude/Demeanor/Rapport: Engaged Affect (typically observed): Accepting Orientation: : Oriented to Self, Oriented to Place, Oriented to  Time, Oriented to Situation   Psych Involvement: No (comment)  Admission diagnosis:  ACS (acute coronary syndrome) (HCC) [I24.9] NSTEMI (non-ST elevated myocardial infarction) [facility]) [I21.4] Patient Active Problem List   Diagnosis Date Noted   ACS (acute coronary syndrome) (HCC) 02/02/2023   NSTEMI (non-ST elevated myocardial infarction) (HCC) 02/02/2023   Essential hypertension 02/28/2022   Erythema migrans (Lyme disease) 12/17/2021   Chronic constipation 05/18/2021   Elevated blood pressure reading 03/06/2020   Dysphagia 12/23/2019   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   Pain in left knee 09/24/2019   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   History of subdural hematoma 03/26/2019   Herpes simplex type 1 infection 11/01/2018   History of Guillain-Barre syndrome 04/01/2018   Hypercholesteremia 03/30/2018   Mastalgia, left, followed by GYN 03/29/2018   Fuchs'  corneal dystrophy, 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   Insulin resistance 03/29/2018   History of hysterectomy 03/29/2018   Grade I diastolic dysfunction 03/29/2018   Thyroid nodule 03/27/2018   Presbycusis of both ears 03/10/2017   Dysthymia 09/26/2016   Palpitations 01/17/2013   GERD (gastroesophageal reflux disease)    Restless leg syndrome,  on Requip    Allergic rhinitis 06/25/2009   Diaphragmatic hernia 02/16/2009   IBS (irritable bowel syndrome) 02/16/2009   Celiac disease 11/27/2007   Insomnia 07/05/2007   Osteoarthritis 07/05/2007   Osteopenia 10/03/2006   Left carotid bruit 10/03/2006   History of thyroid cancer 10/03/2006   PCP:  Shelva Majestic, MD Pharmacy:   [facility] [phone] - [place], [place] - [address] [place] [address] Phone: [phone] Fax: [phone]  [facility] [address] [place] [address] Phone: [phone] Fax: [phone]     Social Determinants of Health (SDOH) Social History: SDOH Screenings   Food Insecurity: No Food Insecurity (02/03/2023)  Housing: Low Risk  (02/01/2022)  Transportation Needs: No Transportation Needs (02/03/2023)  Utilities: Not At Risk (02/03/2023)  Depression (PHQ2-9): Low Risk  (01/12/2023)  Financial Resource Strain: Low Risk  (02/01/2022)  Physical Activity: Sufficiently Active (02/01/2022)  Stress: No Stress Concern Present (02/01/2022)  Tobacco Use: Low Risk  (02/02/2023)   SDOH Interventions:     Readmission Risk Interventions     No data to display

## 2023-02-03 NOTE — Progress Notes (Signed)
  Echocardiogram 2D Echocardiogram has been performed.  Delcie Roch 02/03/2023, 4:52 PM

## 2023-02-03 NOTE — H&P (View-Only) (Signed)
   Patient Name: Amanda [name] Date of Encounter: 02/03/2023 [facility] Cardiologist: Bryan Lemma, MD   Interval Summary  .    No recurrence of chest pain overnight.  Biggest complaint this morning is lack of sleep and being tired.  Tropes have increased to 255.  Vital Signs .    Vitals:   02/03/23 0245 02/03/23 0300 02/03/23 0400 02/03/23 0500  BP: (!) 142/77 (!) 149/75 (!) 144/68 (!) 144/68  Pulse:      Resp: 16 18 18 14   Temp:      TempSrc:      SpO2:      Weight:      Height:        Intake/Output Summary (Last 24 hours) at 02/03/2023 0754 Last data filed at 02/03/2023 0500 Gross per 24 hour  Intake 44.01 ml  Output --  Net 44.01 ml      02/02/2023    8:00 PM 02/02/2023    7:58 PM 01/12/2023   10:43 AM  Last 3 Weights  Weight (lbs) 156 lb 4.9 oz 146 lb 153 lb 6.4 oz  Weight (kg) 70.9 kg 66.225 kg 69.582 kg      Telemetry/ECG    Sinus rhythm with PVCs- Personally Reviewed  Physical Exam .   GEN: No acute distress.   Neck: No JVD Cardiac: RRR, no murmurs, rubs, or gallops.  Respiratory: Clear to auscultation bilaterally. GI: Soft, nontender, non-distended  MS: No edema Vascular: +2 radial pulses and +2 PT pulses  Assessment & Plan .     1.  Acute coronary syndrome: The patient had dynamic ST changes and what looks like an evolving inferior ST elevation myocardial infarction pattern.  The patient is chest pain-free.  Continue aspirin, heparin drip, metoprolol, and will increase Crestor to 40 mg.  Coronary angiography later today.  Obtain echocardiogram. 2.  Hyperlipidemia: Increase Crestor to 40 mg. 3.  Hypokalemia: This is mild we will give today. 4.  Hypertension: Start losartan 25 mg daily.  Cont Toprol.  I have reviewed the risks, indications, and alternatives to cardiac catheterization, possible angioplasty, and stenting with the patient. Risks include but are not limited to bleeding, infection, vascular injury, stroke, myocardial  infection, arrhythmia, kidney injury, radiation-related injury in the case of prolonged fluoroscopy use, emergency cardiac surgery, and death. The patient understands the risks of serious complication is 1-2 in 1000 with diagnostic cardiac cath and 1-2% or less with angioplasty/stenting.    For questions or updates, please contact [facility] Please consult [email or web address] for contact info under        Signed, Orbie Pyo, MD

## 2023-02-03 NOTE — Progress Notes (Signed)
   Patient Name: Amanda [name] Date of Encounter: 02/03/2023 [facility] Cardiologist: Bryan Lemma, MD   Interval Summary  .    No recurrence of chest pain overnight.  Biggest complaint this morning is lack of sleep and being tired.  Tropes have increased to 255.  Vital Signs .    Vitals:   02/03/23 0245 02/03/23 0300 02/03/23 0400 02/03/23 0500  BP: (!) 142/77 (!) 149/75 (!) 144/68 (!) 144/68  Pulse:      Resp: 16 18 18 14   Temp:      TempSrc:      SpO2:      Weight:      Height:        Intake/Output Summary (Last 24 hours) at 02/03/2023 0754 Last data filed at 02/03/2023 0500 Gross per 24 hour  Intake 44.01 ml  Output --  Net 44.01 ml      02/02/2023    8:00 PM 02/02/2023    7:58 PM 01/12/2023   10:43 AM  Last 3 Weights  Weight (lbs) 156 lb 4.9 oz 146 lb 153 lb 6.4 oz  Weight (kg) 70.9 kg 66.225 kg 69.582 kg      Telemetry/ECG    Sinus rhythm with PVCs- Personally Reviewed  Physical Exam .   GEN: No acute distress.   Neck: No JVD Cardiac: RRR, no murmurs, rubs, or gallops.  Respiratory: Clear to auscultation bilaterally. GI: Soft, nontender, non-distended  MS: No edema Vascular: +2 radial pulses and +2 PT pulses  Assessment & Plan .     1.  Acute coronary syndrome: The patient had dynamic ST changes and what looks like an evolving inferior ST elevation myocardial infarction pattern.  The patient is chest pain-free.  Continue aspirin, heparin drip, metoprolol, and will increase Crestor to 40 mg.  Coronary angiography later today.  Obtain echocardiogram. 2.  Hyperlipidemia: Increase Crestor to 40 mg. 3.  Hypokalemia: This is mild we will give today. 4.  Hypertension: Start losartan 25 mg daily.  Cont Toprol.  I have reviewed the risks, indications, and alternatives to cardiac catheterization, possible angioplasty, and stenting with the patient. Risks include but are not limited to bleeding, infection, vascular injury, stroke, myocardial  infection, arrhythmia, kidney injury, radiation-related injury in the case of prolonged fluoroscopy use, emergency cardiac surgery, and death. The patient understands the risks of serious complication is 1-2 in 1000 with diagnostic cardiac cath and 1-2% or less with angioplasty/stenting.    For questions or updates, please contact [facility] Please consult [email or web address] for contact info under        Signed, Orbie Pyo, MD

## 2023-02-04 DIAGNOSIS — E7849 Other hyperlipidemia: Secondary | ICD-10-CM | POA: Diagnosis not present

## 2023-02-04 DIAGNOSIS — I10 Essential (primary) hypertension: Secondary | ICD-10-CM | POA: Diagnosis not present

## 2023-02-04 DIAGNOSIS — I249 Acute ischemic heart disease, unspecified: Secondary | ICD-10-CM | POA: Diagnosis not present

## 2023-02-04 LAB — BASIC METABOLIC PANEL
Anion gap: 8 (ref 5–15)
BUN: 13 mg/dL (ref 8–23)
CO2: 23 mmol/L (ref 22–32)
Calcium: 8.8 mg/dL — ABNORMAL LOW (ref 8.9–10.3)
Chloride: 106 mmol/L (ref 98–111)
Creatinine, Ser: 0.85 mg/dL (ref 0.44–1.00)
GFR, Estimated: >60 mL/min (ref >60)
Glucose, Bld: 113 mg/dL — ABNORMAL HIGH (ref 70–99)
Potassium: 3.9 mmol/L (ref 3.5–5.1)
Sodium: 137 mmol/L (ref 135–145)

## 2023-02-04 LAB — CBC
HCT: 37.2 % (ref 36.0–46.0)
Hemoglobin: 12.1 g/dL (ref 12.0–15.0)
MCH: 29.8 pg (ref 26.0–34.0)
MCHC: 32.5 g/dL (ref 30.0–36.0)
MCV: 91.6 fL (ref 80.0–100.0)
Platelets: 208 10*3/uL (ref 150–400)
RBC: 4.06 MIL/uL (ref 3.87–5.11)
RDW: 12.7 % (ref 11.5–15.5)
WBC: 8 10*3/uL (ref 4.0–10.5)
nRBC: 0 % (ref 0.0–0.2)

## 2023-02-04 LAB — LIPOPROTEIN A (LPA): Lipoprotein (a): 22.6 nmol/L (ref <75.0)

## 2023-02-04 LAB — MAGNESIUM: Magnesium: 2.3 mg/dL (ref 1.7–2.4)

## 2023-02-04 MED ORDER — ROSUVASTATIN CALCIUM 40 MG PO TABS: 40.0000 mg | ORAL_TABLET | Freq: Every day | ORAL | 3 refills | Status: DC

## 2023-02-04 MED ORDER — NITROGLYCERIN 0.4 MG SL SUBL: 0.4000 mg | SUBLINGUAL_TABLET | SUBLINGUAL | 2 refills | Status: DC | PRN

## 2023-02-04 MED ORDER — POTASSIUM CHLORIDE CRYS ER 20 MEQ PO TBCR
20.0000 meq | EXTENDED_RELEASE_TABLET | Freq: Once | ORAL | Status: AC
  Administered 2023-02-04: 20 meq via ORAL
  Filled 2023-02-04: qty 1

## 2023-02-04 MED ORDER — LOSARTAN POTASSIUM 25 MG PO TABS: 25.0000 mg | ORAL_TABLET | Freq: Every day | ORAL | 3 refills | Status: DC

## 2023-02-04 MED ORDER — ISOSORBIDE MONONITRATE ER 30 MG PO TB24: 15.0000 mg | ORAL_TABLET | Freq: Every day | ORAL | 3 refills | Status: DC

## 2023-02-04 MED ORDER — ASPIRIN 81 MG PO TBEC: 81.0000 mg | DELAYED_RELEASE_TABLET | Freq: Every day | ORAL | 3 refills | Status: DC

## 2023-02-04 MED ORDER — ISOSORBIDE MONONITRATE ER 30 MG PO TB24
15.0000 mg | ORAL_TABLET | Freq: Every day | ORAL | Status: DC
  Administered 2023-02-04: 15 mg via ORAL
  Filled 2023-02-04: qty 1

## 2023-02-04 NOTE — Discharge Summary (Addendum)
Discharge Summary    Amanda ID: Amanda [name] MRN: [id number]; DOB: January 01, 1951  Admit date: 02/02/2023 Discharge date: 02/04/2023  PCP:  Shelva Majestic, MD   [facility] Providers Cardiologist:  Bryan Lemma, MD   {    Discharge Diagnoses    Principal Problem:   ACS (acute coronary syndrome) [facility]) Active Problems:   Hypercholesteremia   NSTEMI (non-ST elevated myocardial infarction) [facility])    Diagnostic Studies/Procedures    LHC 02/03/23:   Fluoro time: 2.5 (min) DAP: 6.7 (Gycm2) Cumulative Air Kerma: 122.9 (mGy)  Diagnostic Dominance: Right  Intervention      Mid LM to Dist LM lesion is 10% stenosed.   Dist Cx lesion is 30% stenosed.   The left ventricular systolic function is normal.   LV end diastolic pressure is normal.   Recommend Aspirin 81mg  daily for moderate CAD.   Minimal nonobstructive CAD with smooth 10% ostial LAD narrowing, and 30% stenosis in a small caliber tortuous distal marginal vessel.   Normal dominant RCA.   Preserved global LV contractility with EF estimate 55%.  There is a possible subtle region of minimal hypocontractility focally in the anterolateral wall raising concern for possible transient vasospasm in etiology of her chest pain and mild troponin increase   RECOMMENDATION: Medical therapy.  Consider possible low-dose vasodilator therapy.  Aggressive lipid-lowering therapy with elevated lipids with total cholesterol at 252 and LDL cholesterol 163.    Echo from 02/03/23:   1. Left ventricular ejection fraction, by estimation, is 60 to 65%. The  left ventricle has normal function. The left ventricle has no regional  wall motion abnormalities. Left ventricular diastolic parameters are  consistent with Grade I diastolic  dysfunction (impaired relaxation).   2. Right ventricular systolic function is normal. The right ventricular  size is normal. There is normal pulmonary artery systolic pressure.   3. The mitral  valve is normal in structure. Trivial mitral valve  regurgitation. No evidence of mitral stenosis.   4. The aortic valve is tricuspid. Aortic valve regurgitation is trivial.  No aortic stenosis is present.   5. The inferior vena cava is normal in size with greater than 50%  respiratory variability, suggesting right atrial pressure of 3 mmHg.     History of Present Illness     Per admission H&P 02/02/23 by Dr Orson Aloe:   Amanda [name] is a 72 y.o. female with a significant past medical history of hypercholesteremia and non-sustained supraventricular tachycardia who is being seen 02/02/2023 for the evaluation of chest pain.   Amanda [name] reported while out to dinner that evening she immediately noticed pain at her upper chest that radiated throughout her upper chest after using the restroom.  The pain was described as a heaviness, intense, and associated with sweaty palms, nausea, lightheadedness, and fatigue.  She tried to let it past by resting at the dinner table but her symptoms didn't resolve after 25 minutes for which she told her husband to drive her to the emergency department.  Amanda went to [facility] which the initial ECG was of poor quality but demonstrated repolarization changes with ST elevations noted in the inferior lead.  Repeat ECG was concerning for ST elevations in the inferior leads with posterior reciprical changes.  Subsequent ECG didn't demonstrate ST elevations.  STEMI was initially initiated but discontinued after subsequent ECGs that didn't demonstrated ST elevations.  Initial HS-troponin was normal.  Amanda was transferred to [facility] for further management.  Discharge Summary    Amanda ID: Amanda [name] MRN: [id number]; DOB: January 01, 1951  Admit date: 02/02/2023 Discharge date: 02/04/2023  PCP:  Shelva Majestic, MD   [facility] Providers Cardiologist:  Bryan Lemma, MD   {    Discharge Diagnoses    Principal Problem:   ACS (acute coronary syndrome) [facility]) Active Problems:   Hypercholesteremia   NSTEMI (non-ST elevated myocardial infarction) [facility])    Diagnostic Studies/Procedures    LHC 02/03/23:   Fluoro time: 2.5 (min) DAP: 6.7 (Gycm2) Cumulative Air Kerma: 122.9 (mGy)  Diagnostic Dominance: Right  Intervention      Mid LM to Dist LM lesion is 10% stenosed.   Dist Cx lesion is 30% stenosed.   The left ventricular systolic function is normal.   LV end diastolic pressure is normal.   Recommend Aspirin 81mg  daily for moderate CAD.   Minimal nonobstructive CAD with smooth 10% ostial LAD narrowing, and 30% stenosis in a small caliber tortuous distal marginal vessel.   Normal dominant RCA.   Preserved global LV contractility with EF estimate 55%.  There is a possible subtle region of minimal hypocontractility focally in the anterolateral wall raising concern for possible transient vasospasm in etiology of her chest pain and mild troponin increase   RECOMMENDATION: Medical therapy.  Consider possible low-dose vasodilator therapy.  Aggressive lipid-lowering therapy with elevated lipids with total cholesterol at 252 and LDL cholesterol 163.    Echo from 02/03/23:   1. Left ventricular ejection fraction, by estimation, is 60 to 65%. The  left ventricle has normal function. The left ventricle has no regional  wall motion abnormalities. Left ventricular diastolic parameters are  consistent with Grade I diastolic  dysfunction (impaired relaxation).   2. Right ventricular systolic function is normal. The right ventricular  size is normal. There is normal pulmonary artery systolic pressure.   3. The mitral  valve is normal in structure. Trivial mitral valve  regurgitation. No evidence of mitral stenosis.   4. The aortic valve is tricuspid. Aortic valve regurgitation is trivial.  No aortic stenosis is present.   5. The inferior vena cava is normal in size with greater than 50%  respiratory variability, suggesting right atrial pressure of 3 mmHg.     History of Present Illness     Per admission H&P 02/02/23 by Dr Orson Aloe:   Amanda [name] is a 72 y.o. female with a significant past medical history of hypercholesteremia and non-sustained supraventricular tachycardia who is being seen 02/02/2023 for the evaluation of chest pain.   Amanda [name] reported while out to dinner that evening she immediately noticed pain at her upper chest that radiated throughout her upper chest after using the restroom.  The pain was described as a heaviness, intense, and associated with sweaty palms, nausea, lightheadedness, and fatigue.  She tried to let it past by resting at the dinner table but her symptoms didn't resolve after 25 minutes for which she told her husband to drive her to the emergency department.  Amanda went to [facility] which the initial ECG was of poor quality but demonstrated repolarization changes with ST elevations noted in the inferior lead.  Repeat ECG was concerning for ST elevations in the inferior leads with posterior reciprical changes.  Subsequent ECG didn't demonstrate ST elevations.  STEMI was initially initiated but discontinued after subsequent ECGs that didn't demonstrated ST elevations.  Initial HS-troponin was normal.  Amanda was transferred to [facility] for further management.  PLT 260  --  220 208   < > = values in this interval not displayed.   Basic Metabolic Panel Recent Labs    16/10/96 0416 02/04/23 0242  NA 139 137  K 3.7 3.9  CL 106 106  CO2 22 23  GLUCOSE 107* 113*  BUN 13 13  CREATININE 0.80 0.85  CALCIUM 8.9 8.8*  MG  --  2.3   Liver Function Tests Recent Labs    02/02/23 1856  AST 41  ALT 19  ALKPHOS 82  BILITOT 0.9  PROT 8.7*  ALBUMIN 4.7   No results for input(s): "LIPASE", "AMYLASE" in the last 72 hours. High Sensitivity Troponin:   Recent Labs  Lab 02/02/23 1856 02/02/23 2123  TROPONINIHS 4 255*    BNP Invalid input(s): "POCBNP" D-Dimer Recent Labs    02/03/23 0416  DDIMER 0.38   Hemoglobin A1C Recent Labs    02/02/23 1856  HGBA1C 5.7*   Fasting Lipid Panel Recent Labs    02/03/23 0416  CHOL 252*  HDL 76  LDLCALC 163*  TRIG 66  CHOLHDL 3.3   Thyroid Function Tests No results for input(s):  "TSH", "T4TOTAL", "T3FREE", "THYROIDAB" in the last 72 hours.  Invalid input(s): "FREET3" _____________  ECHOCARDIOGRAM COMPLETE  Result Date: 02/03/2023    ECHOCARDIOGRAM REPORT   Amanda Name:   CLIMMIE [name] Date of Exam: 02/03/2023 Medical Rec #:  [id number]          Height:       65.0 in Accession #:    [id number]         Weight:       156.3 lb Date of Birth:  Jan 22, 1951          BSA:          1.781 m Amanda Age:    71 years           BP:           123/72 mmHg Amanda Gender: F                  HR:           70 bpm. Exam Location:  Inpatient Procedure: 2D Echo, Color Doppler and Cardiac Doppler Indications:    NSTEMI  History:        Amanda has prior history of Echocardiogram examinations, most                 recent 01/18/2013. Risk Factors:Hypertension and Dyslipidemia.  Sonographer:    Delcie Roch RDCS Referring Phys: [id number] KAMAL H HENDERSON IMPRESSIONS  1. Left ventricular ejection fraction, by estimation, is 60 to 65%. The left ventricle has normal function. The left ventricle has no regional wall motion abnormalities. Left ventricular diastolic parameters are consistent with Grade I diastolic dysfunction (impaired relaxation).  2. Right ventricular systolic function is normal. The right ventricular size is normal. There is normal pulmonary artery systolic pressure.  3. The mitral valve is normal in structure. Trivial mitral valve regurgitation. No evidence of mitral stenosis.  4. The aortic valve is tricuspid. Aortic valve regurgitation is trivial. No aortic stenosis is present.  5. The inferior vena cava is normal in size with greater than 50% respiratory variability, suggesting right atrial pressure of 3 mmHg. FINDINGS  Left Ventricle: Left ventricular ejection fraction, by estimation, is 60 to 65%. The left ventricle has normal function. The left ventricle has no regional wall motion abnormalities. The left ventricular internal cavity size was normal  Discharge Summary    Amanda ID: Amanda [name] MRN: [id number]; DOB: January 01, 1951  Admit date: 02/02/2023 Discharge date: 02/04/2023  PCP:  Shelva Majestic, MD   [facility] Providers Cardiologist:  Bryan Lemma, MD   {    Discharge Diagnoses    Principal Problem:   ACS (acute coronary syndrome) [facility]) Active Problems:   Hypercholesteremia   NSTEMI (non-ST elevated myocardial infarction) [facility])    Diagnostic Studies/Procedures    LHC 02/03/23:   Fluoro time: 2.5 (min) DAP: 6.7 (Gycm2) Cumulative Air Kerma: 122.9 (mGy)  Diagnostic Dominance: Right  Intervention      Mid LM to Dist LM lesion is 10% stenosed.   Dist Cx lesion is 30% stenosed.   The left ventricular systolic function is normal.   LV end diastolic pressure is normal.   Recommend Aspirin 81mg  daily for moderate CAD.   Minimal nonobstructive CAD with smooth 10% ostial LAD narrowing, and 30% stenosis in a small caliber tortuous distal marginal vessel.   Normal dominant RCA.   Preserved global LV contractility with EF estimate 55%.  There is a possible subtle region of minimal hypocontractility focally in the anterolateral wall raising concern for possible transient vasospasm in etiology of her chest pain and mild troponin increase   RECOMMENDATION: Medical therapy.  Consider possible low-dose vasodilator therapy.  Aggressive lipid-lowering therapy with elevated lipids with total cholesterol at 252 and LDL cholesterol 163.    Echo from 02/03/23:   1. Left ventricular ejection fraction, by estimation, is 60 to 65%. The  left ventricle has normal function. The left ventricle has no regional  wall motion abnormalities. Left ventricular diastolic parameters are  consistent with Grade I diastolic  dysfunction (impaired relaxation).   2. Right ventricular systolic function is normal. The right ventricular  size is normal. There is normal pulmonary artery systolic pressure.   3. The mitral  valve is normal in structure. Trivial mitral valve  regurgitation. No evidence of mitral stenosis.   4. The aortic valve is tricuspid. Aortic valve regurgitation is trivial.  No aortic stenosis is present.   5. The inferior vena cava is normal in size with greater than 50%  respiratory variability, suggesting right atrial pressure of 3 mmHg.     History of Present Illness     Per admission H&P 02/02/23 by Dr Orson Aloe:   Amanda [name] is a 72 y.o. female with a significant past medical history of hypercholesteremia and non-sustained supraventricular tachycardia who is being seen 02/02/2023 for the evaluation of chest pain.   Amanda [name] reported while out to dinner that evening she immediately noticed pain at her upper chest that radiated throughout her upper chest after using the restroom.  The pain was described as a heaviness, intense, and associated with sweaty palms, nausea, lightheadedness, and fatigue.  She tried to let it past by resting at the dinner table but her symptoms didn't resolve after 25 minutes for which she told her husband to drive her to the emergency department.  Amanda went to [facility] which the initial ECG was of poor quality but demonstrated repolarization changes with ST elevations noted in the inferior lead.  Repeat ECG was concerning for ST elevations in the inferior leads with posterior reciprical changes.  Subsequent ECG didn't demonstrate ST elevations.  STEMI was initially initiated but discontinued after subsequent ECGs that didn't demonstrated ST elevations.  Initial HS-troponin was normal.  Amanda was transferred to [facility] for further management.  Discharge Summary    Amanda ID: Amanda [name] MRN: [id number]; DOB: January 01, 1951  Admit date: 02/02/2023 Discharge date: 02/04/2023  PCP:  Shelva Majestic, MD   [facility] Providers Cardiologist:  Bryan Lemma, MD   {    Discharge Diagnoses    Principal Problem:   ACS (acute coronary syndrome) [facility]) Active Problems:   Hypercholesteremia   NSTEMI (non-ST elevated myocardial infarction) [facility])    Diagnostic Studies/Procedures    LHC 02/03/23:   Fluoro time: 2.5 (min) DAP: 6.7 (Gycm2) Cumulative Air Kerma: 122.9 (mGy)  Diagnostic Dominance: Right  Intervention      Mid LM to Dist LM lesion is 10% stenosed.   Dist Cx lesion is 30% stenosed.   The left ventricular systolic function is normal.   LV end diastolic pressure is normal.   Recommend Aspirin 81mg  daily for moderate CAD.   Minimal nonobstructive CAD with smooth 10% ostial LAD narrowing, and 30% stenosis in a small caliber tortuous distal marginal vessel.   Normal dominant RCA.   Preserved global LV contractility with EF estimate 55%.  There is a possible subtle region of minimal hypocontractility focally in the anterolateral wall raising concern for possible transient vasospasm in etiology of her chest pain and mild troponin increase   RECOMMENDATION: Medical therapy.  Consider possible low-dose vasodilator therapy.  Aggressive lipid-lowering therapy with elevated lipids with total cholesterol at 252 and LDL cholesterol 163.    Echo from 02/03/23:   1. Left ventricular ejection fraction, by estimation, is 60 to 65%. The  left ventricle has normal function. The left ventricle has no regional  wall motion abnormalities. Left ventricular diastolic parameters are  consistent with Grade I diastolic  dysfunction (impaired relaxation).   2. Right ventricular systolic function is normal. The right ventricular  size is normal. There is normal pulmonary artery systolic pressure.   3. The mitral  valve is normal in structure. Trivial mitral valve  regurgitation. No evidence of mitral stenosis.   4. The aortic valve is tricuspid. Aortic valve regurgitation is trivial.  No aortic stenosis is present.   5. The inferior vena cava is normal in size with greater than 50%  respiratory variability, suggesting right atrial pressure of 3 mmHg.     History of Present Illness     Per admission H&P 02/02/23 by Dr Orson Aloe:   Amanda [name] is a 72 y.o. female with a significant past medical history of hypercholesteremia and non-sustained supraventricular tachycardia who is being seen 02/02/2023 for the evaluation of chest pain.   Amanda [name] reported while out to dinner that evening she immediately noticed pain at her upper chest that radiated throughout her upper chest after using the restroom.  The pain was described as a heaviness, intense, and associated with sweaty palms, nausea, lightheadedness, and fatigue.  She tried to let it past by resting at the dinner table but her symptoms didn't resolve after 25 minutes for which she told her husband to drive her to the emergency department.  Amanda went to [facility] which the initial ECG was of poor quality but demonstrated repolarization changes with ST elevations noted in the inferior lead.  Repeat ECG was concerning for ST elevations in the inferior leads with posterior reciprical changes.  Subsequent ECG didn't demonstrate ST elevations.  STEMI was initially initiated but discontinued after subsequent ECGs that didn't demonstrated ST elevations.  Initial HS-troponin was normal.  Amanda was transferred to [facility] for further management.  Discharge Summary    Amanda ID: Amanda [name] MRN: [id number]; DOB: January 01, 1951  Admit date: 02/02/2023 Discharge date: 02/04/2023  PCP:  Shelva Majestic, MD   [facility] Providers Cardiologist:  Bryan Lemma, MD   {    Discharge Diagnoses    Principal Problem:   ACS (acute coronary syndrome) [facility]) Active Problems:   Hypercholesteremia   NSTEMI (non-ST elevated myocardial infarction) [facility])    Diagnostic Studies/Procedures    LHC 02/03/23:   Fluoro time: 2.5 (min) DAP: 6.7 (Gycm2) Cumulative Air Kerma: 122.9 (mGy)  Diagnostic Dominance: Right  Intervention      Mid LM to Dist LM lesion is 10% stenosed.   Dist Cx lesion is 30% stenosed.   The left ventricular systolic function is normal.   LV end diastolic pressure is normal.   Recommend Aspirin 81mg  daily for moderate CAD.   Minimal nonobstructive CAD with smooth 10% ostial LAD narrowing, and 30% stenosis in a small caliber tortuous distal marginal vessel.   Normal dominant RCA.   Preserved global LV contractility with EF estimate 55%.  There is a possible subtle region of minimal hypocontractility focally in the anterolateral wall raising concern for possible transient vasospasm in etiology of her chest pain and mild troponin increase   RECOMMENDATION: Medical therapy.  Consider possible low-dose vasodilator therapy.  Aggressive lipid-lowering therapy with elevated lipids with total cholesterol at 252 and LDL cholesterol 163.    Echo from 02/03/23:   1. Left ventricular ejection fraction, by estimation, is 60 to 65%. The  left ventricle has normal function. The left ventricle has no regional  wall motion abnormalities. Left ventricular diastolic parameters are  consistent with Grade I diastolic  dysfunction (impaired relaxation).   2. Right ventricular systolic function is normal. The right ventricular  size is normal. There is normal pulmonary artery systolic pressure.   3. The mitral  valve is normal in structure. Trivial mitral valve  regurgitation. No evidence of mitral stenosis.   4. The aortic valve is tricuspid. Aortic valve regurgitation is trivial.  No aortic stenosis is present.   5. The inferior vena cava is normal in size with greater than 50%  respiratory variability, suggesting right atrial pressure of 3 mmHg.     History of Present Illness     Per admission H&P 02/02/23 by Dr Orson Aloe:   Amanda [name] is a 72 y.o. female with a significant past medical history of hypercholesteremia and non-sustained supraventricular tachycardia who is being seen 02/02/2023 for the evaluation of chest pain.   Amanda [name] reported while out to dinner that evening she immediately noticed pain at her upper chest that radiated throughout her upper chest after using the restroom.  The pain was described as a heaviness, intense, and associated with sweaty palms, nausea, lightheadedness, and fatigue.  She tried to let it past by resting at the dinner table but her symptoms didn't resolve after 25 minutes for which she told her husband to drive her to the emergency department.  Amanda went to [facility] which the initial ECG was of poor quality but demonstrated repolarization changes with ST elevations noted in the inferior lead.  Repeat ECG was concerning for ST elevations in the inferior leads with posterior reciprical changes.  Subsequent ECG didn't demonstrate ST elevations.  STEMI was initially initiated but discontinued after subsequent ECGs that didn't demonstrated ST elevations.  Initial HS-troponin was normal.  Amanda was transferred to [facility] for further management.  Discharge Summary    Amanda ID: Amanda [name] MRN: [id number]; DOB: January 01, 1951  Admit date: 02/02/2023 Discharge date: 02/04/2023  PCP:  Shelva Majestic, MD   [facility] Providers Cardiologist:  Bryan Lemma, MD   {    Discharge Diagnoses    Principal Problem:   ACS (acute coronary syndrome) [facility]) Active Problems:   Hypercholesteremia   NSTEMI (non-ST elevated myocardial infarction) [facility])    Diagnostic Studies/Procedures    LHC 02/03/23:   Fluoro time: 2.5 (min) DAP: 6.7 (Gycm2) Cumulative Air Kerma: 122.9 (mGy)  Diagnostic Dominance: Right  Intervention      Mid LM to Dist LM lesion is 10% stenosed.   Dist Cx lesion is 30% stenosed.   The left ventricular systolic function is normal.   LV end diastolic pressure is normal.   Recommend Aspirin 81mg  daily for moderate CAD.   Minimal nonobstructive CAD with smooth 10% ostial LAD narrowing, and 30% stenosis in a small caliber tortuous distal marginal vessel.   Normal dominant RCA.   Preserved global LV contractility with EF estimate 55%.  There is a possible subtle region of minimal hypocontractility focally in the anterolateral wall raising concern for possible transient vasospasm in etiology of her chest pain and mild troponin increase   RECOMMENDATION: Medical therapy.  Consider possible low-dose vasodilator therapy.  Aggressive lipid-lowering therapy with elevated lipids with total cholesterol at 252 and LDL cholesterol 163.    Echo from 02/03/23:   1. Left ventricular ejection fraction, by estimation, is 60 to 65%. The  left ventricle has normal function. The left ventricle has no regional  wall motion abnormalities. Left ventricular diastolic parameters are  consistent with Grade I diastolic  dysfunction (impaired relaxation).   2. Right ventricular systolic function is normal. The right ventricular  size is normal. There is normal pulmonary artery systolic pressure.   3. The mitral  valve is normal in structure. Trivial mitral valve  regurgitation. No evidence of mitral stenosis.   4. The aortic valve is tricuspid. Aortic valve regurgitation is trivial.  No aortic stenosis is present.   5. The inferior vena cava is normal in size with greater than 50%  respiratory variability, suggesting right atrial pressure of 3 mmHg.     History of Present Illness     Per admission H&P 02/02/23 by Dr Orson Aloe:   Amanda [name] is a 72 y.o. female with a significant past medical history of hypercholesteremia and non-sustained supraventricular tachycardia who is being seen 02/02/2023 for the evaluation of chest pain.   Amanda [name] reported while out to dinner that evening she immediately noticed pain at her upper chest that radiated throughout her upper chest after using the restroom.  The pain was described as a heaviness, intense, and associated with sweaty palms, nausea, lightheadedness, and fatigue.  She tried to let it past by resting at the dinner table but her symptoms didn't resolve after 25 minutes for which she told her husband to drive her to the emergency department.  Amanda went to [facility] which the initial ECG was of poor quality but demonstrated repolarization changes with ST elevations noted in the inferior lead.  Repeat ECG was concerning for ST elevations in the inferior leads with posterior reciprical changes.  Subsequent ECG didn't demonstrate ST elevations.  STEMI was initially initiated but discontinued after subsequent ECGs that didn't demonstrated ST elevations.  Initial HS-troponin was normal.  Amanda was transferred to [facility] for further management.

## 2023-02-04 NOTE — Progress Notes (Signed)
Progress Note  Patient Name: Amanda [name] Date of Encounter: 02/04/2023  Primary Cardiologist: Bryan Lemma, MD  Subjective   No acute events overnight, no chest pain the last 24 hours.  Inpatient Medications    Scheduled Meds:  aspirin EC  81 mg Oral Daily   Chlorhexidine Gluconate Cloth  6 each Topical Daily   cholecalciferol  1,000 Units Oral Daily   clonazePAM  0.5 mg Oral QHS   cyanocobalamin  5,000 mcg Oral Daily   escitalopram  10 mg Oral Daily   isosorbide mononitrate  15 mg Oral Daily   loratadine  10 mg Oral Daily   losartan  25 mg Oral Daily   metoprolol succinate  25 mg Oral Daily   potassium chloride  20 mEq Oral Once   rOPINIRole  0.5 mg Oral QHS   rosuvastatin  40 mg Oral Daily   sodium chloride flush  3 mL Intravenous Q12H   Continuous Infusions:  sodium chloride     PRN Meds: sodium chloride, acetaminophen, acetaminophen, alum & mag hydroxide-simeth, melatonin, nitroGLYCERIN, ondansetron (ZOFRAN) IV, ondansetron (ZOFRAN) IV, sodium chloride flush   Vital Signs    Vitals:   02/04/23 0515 02/04/23 0530 02/04/23 0545 02/04/23 0600  BP:    128/70  Pulse: 64 68 67 72  Resp:      Temp:      TempSrc:      SpO2: 96% 94% 94% 94%  Weight:      Height:        Intake/Output Summary (Last 24 hours) at 02/04/2023 0829 Last data filed at 02/03/2023 2000 Gross per 24 hour  Intake 1533.48 ml  Output 200 ml  Net 1333.48 ml   Filed Weights   02/02/23 1958 02/02/23 2000  Weight: 66.2 kg 70.9 kg    Telemetry     Personally reviewed, NSR  ECG    Not performed today  Physical Exam   GEN: No acute distress.   Neck: No JVD. Cardiac: RRR, no murmur, rub, or gallop.  Respiratory: Nonlabored. Clear to auscultation bilaterally. GI: Soft, nontender, bowel sounds present. MS: No edema; No deformity. Neuro:  Nonfocal. Psych: Alert and oriented x 3. Normal affect.  Labs    Chemistry Recent Labs  Lab 02/02/23 1856 02/02/23 1908  02/03/23 0416 02/04/23 0242  NA 137 140 139 137  K 4.0 3.4* 3.7 3.9  CL 102 105 106 106  CO2 23  --  22 23  GLUCOSE 152* 161* 107* 113*  BUN 17 16 13 13   CREATININE 0.96 1.00 0.80 0.85  CALCIUM 9.8  --  8.9 8.8*  PROT 8.7*  --   --   --   ALBUMIN 4.7  --   --   --   AST 41  --   --   --   ALT 19  --   --   --   ALKPHOS 82  --   --   --   BILITOT 0.9  --   --   --   GFRNONAA >60  --  >60 >60  ANIONGAP 12  --  11 8     Hematology Recent Labs  Lab 02/02/23 1856 02/02/23 1908 02/03/23 0416 02/04/23 0242  WBC 5.3  --  6.7 8.0  RBC 4.77  --  4.11 4.06  HGB 14.2 13.6 12.6 12.1  HCT 43.8 40.0 38.2 37.2  MCV 91.8  --  92.9 91.6  MCH 29.8  --  30.7 29.8  MCHC 32.4  --  33.0 32.5  RDW 12.5  --  12.7 12.7  PLT 260  --  220 208    Cardiac Enzymes Recent Labs  Lab 02/02/23 1856 02/02/23 2123  TROPONINIHS 4 255*    BNPNo results for input(s): "BNP", "PROBNP" in the last 168 hours.   DDimer Recent Labs  Lab 02/03/23 0416  DDIMER 0.38     Radiology    ECHOCARDIOGRAM COMPLETE  Result Date: 02/03/2023    ECHOCARDIOGRAM REPORT   Patient Name:   Amanda [name] Date of Exam: 02/03/2023 Medical Rec #:  [id number]          Height:       65.0 in Accession #:    [id number]         Weight:       156.3 lb Date of Birth:  09/27/1950          BSA:          1.781 m Patient Age:    71 years           BP:           123/72 mmHg Patient Gender: F                  HR:           70 bpm. Exam Location:  Inpatient Procedure: 2D Echo, Color Doppler and Cardiac Doppler Indications:    NSTEMI  History:        Patient has prior history of Echocardiogram examinations, most                 recent 01/18/2013. Risk Factors:Hypertension and Dyslipidemia.  Sonographer:    Delcie Roch RDCS Referring Phys: [id number] KAMAL H HENDERSON IMPRESSIONS  1. Left ventricular ejection fraction, by estimation, is 60 to 65%. The left ventricle has normal function. The left ventricle has no regional wall motion  abnormalities. Left ventricular diastolic parameters are consistent with Grade I diastolic dysfunction (impaired relaxation).  2. Right ventricular systolic function is normal. The right ventricular size is normal. There is normal pulmonary artery systolic pressure.  3. The mitral valve is normal in structure. Trivial mitral valve regurgitation. No evidence of mitral stenosis.  4. The aortic valve is tricuspid. Aortic valve regurgitation is trivial. No aortic stenosis is present.  5. The inferior vena cava is normal in size with greater than 50% respiratory variability, suggesting right atrial pressure of 3 mmHg. FINDINGS  Left Ventricle: Left ventricular ejection fraction, by estimation, is 60 to 65%. The left ventricle has normal function. The left ventricle has no regional wall motion abnormalities. The left ventricular internal cavity size was normal in size. There is  no left ventricular hypertrophy. Left ventricular diastolic parameters are consistent with Grade I diastolic dysfunction (impaired relaxation). Indeterminate filling pressures. Right Ventricle: The right ventricular size is normal. No increase in right ventricular wall thickness. Right ventricular systolic function is normal. There is normal pulmonary artery systolic pressure. The tricuspid regurgitant velocity is 1.51 m/s, and  with an assumed right atrial pressure of 3 mmHg, the estimated right ventricular systolic pressure is 12.1 mmHg. Left Atrium: Left atrial size was normal in size. Right Atrium: Right atrial size was normal in size. Pericardium: There is no evidence of pericardial effusion. Mitral Valve: The mitral valve is normal in structure. Trivial mitral valve regurgitation. No evidence of mitral valve stenosis. Tricuspid Valve: The tricuspid valve is normal in structure. Tricuspid valve regurgitation is trivial. No evidence of tricuspid stenosis.  Aortic Valve: The aortic valve is tricuspid. Aortic valve regurgitation is trivial. No  aortic stenosis is present. Pulmonic Valve: The pulmonic valve was normal in structure. Pulmonic valve regurgitation is not visualized. No evidence of pulmonic stenosis. Aorta: The aortic root is normal in size and structure. Venous: The inferior vena cava is normal in size with greater than 50% respiratory variability, suggesting right atrial pressure of 3 mmHg. IAS/Shunts: No atrial level shunt detected by color flow Doppler.  LEFT VENTRICLE PLAX 2D LVIDd:         4.30 cm   Diastology LV PW:         1.10 cm   LV e' medial:    7.07 cm/s LV IVS:        0.90 cm   LV E/e' medial:  10.6 LVOT diam:     1.80 cm   LV e' lateral:   11.20 cm/s LV SV:         67        LV E/e' lateral: 6.7 LV SV Index:   37 LVOT Area:     2.54 cm  RIGHT VENTRICLE             IVC RV Basal diam:  2.50 cm     IVC diam: 1.80 cm RV S prime:     11.40 cm/s TAPSE (M-mode): 1.8 cm LEFT ATRIUM             Index        RIGHT ATRIUM           Index LA Vol (A2C):   48.7 ml 27.34 ml/m  RA Area:     12.70 cm LA Vol (A4C):   46.3 ml 25.99 ml/m  RA Volume:   29.00 ml  16.28 ml/m LA Biplane Vol: 47.9 ml 26.89 ml/m  AORTIC VALVE LVOT Vmax:   126.00 cm/s LVOT Vmean:  84.800 cm/s LVOT VTI:    0.262 m  AORTA Ao Root diam: 2.70 cm Ao Asc diam:  2.80 cm MITRAL VALVE                TRICUSPID VALVE MV Area (PHT): 2.37 cm     TR Peak grad:   9.1 mmHg MV Decel Time: 320 msec     TR Vmax:        151.00 cm/s MV E velocity: 74.70 cm/s MV A velocity: 107.00 cm/s  SHUNTS MV E/A ratio:  0.70         Systemic VTI:  0.26 m                             Systemic Diam: 1.80 cm Chilton Si MD Electronically signed by Chilton Si MD Signature Date/Time: 02/03/2023/6:22:35 PM    Final    CARDIAC CATHETERIZATION  Result Date: 02/03/2023   Mid LM to Dist LM lesion is 10% stenosed.   Dist Cx lesion is 30% stenosed.   The left ventricular systolic function is normal.   LV end diastolic pressure is normal.   Recommend Aspirin 81mg  daily for moderate CAD. Minimal  nonobstructive CAD with smooth 10% ostial LAD narrowing, and 30% stenosis in a small caliber tortuous distal marginal vessel. Normal dominant RCA. Preserved global LV contractility with EF estimate 55%.  There is a possible subtle region of minimal hypocontractility focally in the anterolateral wall raising concern for possible transient vasospasm in etiology of her chest pain and mild troponin increase RECOMMENDATION:  Medical therapy.  Consider possible low-dose vasodilator therapy.  Aggressive lipid-lowering therapy with elevated lipids with total cholesterol at 252 and LDL cholesterol 163.   DG Chest Port 1 View  Result Date: 02/02/2023 CLINICAL DATA:  Chest pain EXAM: PORTABLE CHEST 1 VIEW COMPARISON:  Chest x-ray 01/09/2022 FINDINGS: The heart size and mediastinal contours are within normal limits. Both lungs are clear. The visualized skeletal structures are unremarkable. IMPRESSION: No active disease. Electronically Signed   By: Darliss Cheney M.D.   On: 02/02/2023 21:35    Cardiac Studies     Assessment & Plan   ACS HTN HLD  -LHC on 02/03/2023 showed minimal nonobstructive CAD for which aspirin 81 mg once daily was recommended.  LV gram showed possible subtle region of minimal hypocontractility focally in the anterolateral wall concerning for possible transient vasospasm responsible for her chest pains and elevated troponins.  No chest pain the last 24 hours.  R radial access, intact. will start Imdur 15 mg once daily, losartan 25 mg once daily was started yesterday and continue metoprolol succinate 25 mg once daily.  Aggressive lipid-lowering therapy was recommended, continue rosuvastatin 40 mg nightly.  Goal LDL less than 100.  Disposition: Okay to discharge home today.    Signed, Marjo Bicker, MD  02/04/2023, 8:29 AM

## 2023-02-05 ENCOUNTER — Other Ambulatory Visit: Payer: Self-pay | Admitting: Home Health

## 2023-02-06 ENCOUNTER — Encounter (HOSPITAL_COMMUNITY): Payer: Self-pay | Admitting: Cardiovascular Disease

## 2023-02-06 MED FILL — Lidocaine HCl Local Preservative Free (PF) Inj 1%: INTRAMUSCULAR | Qty: 30 | Status: AC

## 2023-02-08 ENCOUNTER — Ambulatory Visit: Payer: Medicare Other | Admitting: Gastroenterology

## 2023-02-08 ENCOUNTER — Other Ambulatory Visit: Payer: Self-pay | Admitting: Home Health

## 2023-02-10 ENCOUNTER — Ambulatory Visit: Payer: Medicare Other | Attending: Physician Assistant | Admitting: Physician Assistant

## 2023-02-10 VITALS — BP 128/74 | HR 70 | Ht 65.0 in | Wt 151.8 lb

## 2023-02-10 DIAGNOSIS — I2510 Atherosclerotic heart disease of native coronary artery without angina pectoris: Secondary | ICD-10-CM | POA: Insufficient documentation

## 2023-02-10 DIAGNOSIS — E785 Hyperlipidemia, unspecified: Secondary | ICD-10-CM | POA: Diagnosis not present

## 2023-02-10 DIAGNOSIS — Z79899 Other long term (current) drug therapy: Secondary | ICD-10-CM | POA: Diagnosis not present

## 2023-02-10 DIAGNOSIS — I10 Essential (primary) hypertension: Secondary | ICD-10-CM | POA: Diagnosis not present

## 2023-02-10 MED ORDER — AMLODIPINE BESYLATE 2.5 MG PO TABS
2.5000 mg | ORAL_TABLET | Freq: Every day | ORAL | 3 refills | Status: DC
Start: 2023-02-10 — End: 2023-03-01

## 2023-02-10 NOTE — Patient Instructions (Addendum)
Medication Instructions:  STOP LOSARTAN  STOP IMDUR  START AMLODIPINE 2.5 MG DAILY  *If you need a refill on your cardiac medications before your next appointment, please call your pharmacy*   Lab Work: FASTING LIPIDS AND LFT IN 6-8 WEEKS. If you have labs (blood work) drawn today and your tests are completely normal, you will receive your results only by: MyChart Message (if you have MyChart) OR A paper copy in the mail If you have any lab test that is abnormal or we need to change your treatment, we will call you to review the results.   Testing/Procedures: NO TESTING   Follow-Up: At [facility], you and your health needs are our priority.  As part of our continuing mission to provide you with exceptional heart care, we have created designated Provider Care Teams.  These Care Teams include your primary Cardiologist (physician) and Advanced Practice Providers (APPs -  Physician Assistants and Nurse Practitioners) who all work together to provide you with the care you need, when you need it.   Your next appointment:   3-4 MONTH(S)  Provider:   Bryan Lemma, MD

## 2023-02-10 NOTE — Progress Notes (Unsigned)
Cardiology Office Note:  .   Date:  02/12/2023  ID:  [name], DOB 1951-03-24, MRN [id number] PCP: Shelva Majestic, MD  [facility] Providers Cardiologist:  Bryan Lemma, MD     History of Present Illness: Amanda [name]   Amanda [name] is a 72 y.o. female with a hx of celiac disease, GERD, palpitation, hyperlipidemia, restless leg syndrome, and a history of atypical chest pain.  She is a former patient of Dr. Alanda Amass. She has been unable to tolerate statins because of memory issues and this was changed to Zetia.  Previous treadmill nuclear stress test obtained in November 2012 showed EF 65%, patient was able to achieve 11 METS of activity, there was no significant ischemia demonstrated, overall low risk study.  Echocardiogram obtained on 01/18/2013 showed EF 60 to 65%, grade 1 DD.  She was able to tolerate a very low-dose of Crestor 5 mg.  During her previous visit with Dr. Herbie Baltimore in October 2021, Dr. Herbie Baltimore recommended a coronary calcium scoring, however she was not ready at the time.  In May 2022, she complained of worsening palpitation.  The duration of the palpitation only lasted a few seconds at a time, TSH and a free T4 were normal, I recommend to increase metoprolol succinate to 50 mg daily.  Given the short duration of the palpitation, I decided to hold off on ordering a heart monitor at the time.  She has too much fatigue on higher dose of metoprolol, metoprolol dose was reduced.  I previous seen the patient for palpitation in 2023.  Heart monitor in April 2023 showed heart rate between 43-1 26, average heart rate 65, rare PAC and PVCs, 1 episode of nonsustained VT lasting 13 beats.  She was seen by Dr. Herbie Baltimore in October 2023 at which time she was doing well.  More recently, patient presented to the hospital with chest tightness while out having dinner.  EKG concerning for ST elevation in the inferior lead with posterior reciprocal changes.  Code STEMI was initiated but later  discontinued as subsequent EKG did not show ST elevation.  Echocardiogram obtained on 02/03/2023 showed EF 60 to 65%, grade 1 DD, trivial AI, trivial MR.  Subsequent cardiac authorization showed 10% mid to distal left main, 30% distal left circumflex lesion.  Patient was placed on aspirin therapy.  Patient presents today for follow-up.  She denies any chest pain shortness of breath.  She could not tolerate Imdur due to severe headache.  She does not want to take losartan either which may drop her blood pressure.  She is willing to try 2.5 mg daily of amlodipine for vasodilatory purpose to help prevent coronary vasospasm.  With a higher dose of Crestor, she will need a fasting lipid panel and LFT in 2 months.  We will see her back in about 3 to 4 months.  ROS:   She denies chest pain, palpitations, dyspnea, pnd, orthopnea, n, v, dizziness, syncope, edema, weight gain, or early satiety. All other systems reviewed and are otherwise negative except as noted above.    Studies Reviewed: Amanda [name]   EKG Interpretation Date/Time:  Friday February 10 2023 13:29:44 EDT Ventricular Rate:  70 PR Interval:  152 QRS Duration:  116 QT Interval:  410 QTC Calculation: 442 R Axis:   -46  Text Interpretation: Normal sinus rhythm Low voltage QRS Right bundle branch block Left anterior fascicular block Bifascicular block Inferior infarct (cited on or before 04-Feb-2023) When compared with ECG of 04-Feb-2023 07:05, T  wave amplitude has decreased in Lateral leads Confirmed by Azalee Course [phone]) on 02/12/2023 8:51:04 PM    Cardiac Studies & Procedures   CARDIAC CATHETERIZATION  CARDIAC CATHETERIZATION 02/03/2023  Narrative   Mid LM to Dist LM lesion is 10% stenosed.   Dist Cx lesion is 30% stenosed.   The left ventricular systolic function is normal.   LV end diastolic pressure is normal.   Recommend Aspirin 81mg  daily for moderate CAD.  Minimal nonobstructive CAD with smooth 10% ostial LAD narrowing, and 30% stenosis  in a small caliber tortuous distal marginal vessel.  Normal dominant RCA.  Preserved global LV contractility with EF estimate 55%.  There is a possible subtle region of minimal hypocontractility focally in the anterolateral wall raising concern for possible transient vasospasm in etiology of her chest pain and mild troponin increase  RECOMMENDATION: Medical therapy.  Consider possible low-dose vasodilator therapy.  Aggressive lipid-lowering therapy with elevated lipids with total cholesterol at 252 and LDL cholesterol 163.  Findings Coronary Findings Diagnostic  Dominance: Right  Left Main Mid LM to Dist LM lesion is 10% stenosed.  Left Circumflex Dist Cx lesion is 30% stenosed.  Fourth Obtuse Marginal Branch Vessel is small in size.  Intervention  No interventions have been documented.     ECHOCARDIOGRAM  ECHOCARDIOGRAM COMPLETE 02/03/2023  Narrative ECHOCARDIOGRAM REPORT    Patient Name:   Amanda [name] Date of Exam: 02/03/2023 Medical Rec #:  [id number]          Height:       65.0 in Accession #:    [id number]         Weight:       156.3 lb Date of Birth:  Sep 30, 1950          BSA:          1.781 m Patient Age:    71 years           BP:           123/72 mmHg Patient Gender: F                  HR:           70 bpm. Exam Location:  Inpatient  Procedure: 2D Echo, Color Doppler and Cardiac Doppler  Indications:    NSTEMI  History:        Patient has prior history of Echocardiogram examinations, most recent 01/18/2013. Risk Factors:Hypertension and Dyslipidemia.  Sonographer:    Delcie Roch RDCS Referring Phys: [id number] KAMAL H HENDERSON  IMPRESSIONS   1. Left ventricular ejection fraction, by estimation, is 60 to 65%. The left ventricle has normal function. The left ventricle has no regional wall motion abnormalities. Left ventricular diastolic parameters are consistent with Grade I diastolic dysfunction (impaired relaxation). 2. Right ventricular  systolic function is normal. The right ventricular size is normal. There is normal pulmonary artery systolic pressure. 3. The mitral valve is normal in structure. Trivial mitral valve regurgitation. No evidence of mitral stenosis. 4. The aortic valve is tricuspid. Aortic valve regurgitation is trivial. No aortic stenosis is present. 5. The inferior vena cava is normal in size with greater than 50% respiratory variability, suggesting right atrial pressure of 3 mmHg.  FINDINGS Left Ventricle: Left ventricular ejection fraction, by estimation, is 60 to 65%. The left ventricle has normal function. The left ventricle has no regional wall motion abnormalities. The left ventricular internal cavity size was normal in size. There is no left ventricular hypertrophy. Left  Cardiology Office Note:  .   Date:  02/12/2023  ID:  [name], DOB 1951-03-24, MRN [id number] PCP: Shelva Majestic, MD  [facility] Providers Cardiologist:  Bryan Lemma, MD     History of Present Illness: Amanda [name]   Amanda [name] is a 72 y.o. female with a hx of celiac disease, GERD, palpitation, hyperlipidemia, restless leg syndrome, and a history of atypical chest pain.  She is a former patient of Dr. Alanda Amass. She has been unable to tolerate statins because of memory issues and this was changed to Zetia.  Previous treadmill nuclear stress test obtained in November 2012 showed EF 65%, patient was able to achieve 11 METS of activity, there was no significant ischemia demonstrated, overall low risk study.  Echocardiogram obtained on 01/18/2013 showed EF 60 to 65%, grade 1 DD.  She was able to tolerate a very low-dose of Crestor 5 mg.  During her previous visit with Dr. Herbie Baltimore in October 2021, Dr. Herbie Baltimore recommended a coronary calcium scoring, however she was not ready at the time.  In May 2022, she complained of worsening palpitation.  The duration of the palpitation only lasted a few seconds at a time, TSH and a free T4 were normal, I recommend to increase metoprolol succinate to 50 mg daily.  Given the short duration of the palpitation, I decided to hold off on ordering a heart monitor at the time.  She has too much fatigue on higher dose of metoprolol, metoprolol dose was reduced.  I previous seen the patient for palpitation in 2023.  Heart monitor in April 2023 showed heart rate between 43-1 26, average heart rate 65, rare PAC and PVCs, 1 episode of nonsustained VT lasting 13 beats.  She was seen by Dr. Herbie Baltimore in October 2023 at which time she was doing well.  More recently, patient presented to the hospital with chest tightness while out having dinner.  EKG concerning for ST elevation in the inferior lead with posterior reciprocal changes.  Code STEMI was initiated but later  discontinued as subsequent EKG did not show ST elevation.  Echocardiogram obtained on 02/03/2023 showed EF 60 to 65%, grade 1 DD, trivial AI, trivial MR.  Subsequent cardiac authorization showed 10% mid to distal left main, 30% distal left circumflex lesion.  Patient was placed on aspirin therapy.  Patient presents today for follow-up.  She denies any chest pain shortness of breath.  She could not tolerate Imdur due to severe headache.  She does not want to take losartan either which may drop her blood pressure.  She is willing to try 2.5 mg daily of amlodipine for vasodilatory purpose to help prevent coronary vasospasm.  With a higher dose of Crestor, she will need a fasting lipid panel and LFT in 2 months.  We will see her back in about 3 to 4 months.  ROS:   She denies chest pain, palpitations, dyspnea, pnd, orthopnea, n, v, dizziness, syncope, edema, weight gain, or early satiety. All other systems reviewed and are otherwise negative except as noted above.    Studies Reviewed: Amanda [name]   EKG Interpretation Date/Time:  Friday February 10 2023 13:29:44 EDT Ventricular Rate:  70 PR Interval:  152 QRS Duration:  116 QT Interval:  410 QTC Calculation: 442 R Axis:   -46  Text Interpretation: Normal sinus rhythm Low voltage QRS Right bundle branch block Left anterior fascicular block Bifascicular block Inferior infarct (cited on or before 04-Feb-2023) When compared with ECG of 04-Feb-2023 07:05, T  Cardiology Office Note:  .   Date:  02/12/2023  ID:  [name], DOB 1951-03-24, MRN [id number] PCP: Shelva Majestic, MD  [facility] Prov[facility] Cardiologist:  Bryan Lemma, MD     History of Present Illness: Amanda [name]   Amanda [name] is a 72 y.o. female with a hx of celiac disease, GERD, palpitation, hyperlipidemia, restless leg syndrome, and a history of atypical chest pain.  She is a former patient of Dr. Alanda Amass. She has been unable to tolerate statins because of memory issues and this was changed to Zetia.  Previous treadmill nuclear stress test obtained in November 2012 showed EF 65%, patient was able to achieve 11 METS of activity, there was no significant ischemia demonstrated, overall low risk study.  Echocardiogram obtained on 01/18/2013 showed EF 60 to 65%, grade 1 DD.  She was able to tolerate a very low-dose of Crestor 5 mg.  During her previous visit with Dr. Herbie Baltimore in October 2021, Dr. Herbie Baltimore recommended a coronary calcium scoring, however she was not ready at the time.  In May 2022, she complained of worsening palpitation.  The duration of the palpitation only lasted a few seconds at a time, TSH and a free T4 were normal, I recommend to increase metoprolol succinate to 50 mg daily.  Given the short duration of the palpitation, I decided to hold off on ordering a heart monitor at the time.  She has too much fatigue on higher dose of metoprolol, metoprolol dose was reduced.  I previous seen the patient for palpitation in 2023.  Heart monitor in April 2023 showed heart rate between 43-1 26, average heart rate 65, rare PAC and PVCs, 1 episode of nonsustained VT lasting 13 beats.  She was seen by Dr. Herbie Baltimore in October 2023 at which time she was doing well.  More recently, patient presented to the hospital with chest tightness while out having dinner.  EKG concerning for ST elevation in the inferior lead with posterior reciprocal changes.  Code STEMI was initiated but later  discontinued as subsequent EKG did not show ST elevation.  Echocardiogram obtained on 02/03/2023 showed EF 60 to 65%, grade 1 DD, trivial AI, trivial MR.  Subsequent cardiac authorization showed 10% mid to distal left main, 30% distal left circumflex lesion.  Patient was placed on aspirin therapy.  Patient presents today for follow-up.  She denies any chest pain shortness of breath.  She could not tolerate Imdur due to severe headache.  She does not want to take losartan either which may drop her blood pressure.  She is willing to try 2.5 mg daily of amlodipine for vasodilatory purpose to help prevent coronary vasospasm.  With a higher dose of Crestor, she will need a fasting lipid panel and LFT in 2 months.  We will see her back in about 3 to 4 months.  ROS:   She denies chest pain, palpitations, dyspnea, pnd, orthopnea, n, v, dizziness, syncope, edema, weight gain, or early satiety. All other systems reviewed and are otherwise negative except as noted above.    Studies Reviewed: Amanda [name]   EKG Interpretation Date/Time:  Friday February 10 2023 13:29:44 EDT Ventricular Rate:  70 PR Interval:  152 QRS Duration:  116 QT Interval:  410 QTC Calculation: 442 R Axis:   -46  Text Interpretation: Normal sinus rhythm Low voltage QRS Right bundle branch block Left anterior fascicular block Bifascicular block Inferior infarct (cited on or before 04-Feb-2023) When compared with ECG of 04-Feb-2023 07:05, T

## 2023-02-15 ENCOUNTER — Ambulatory Visit: Payer: Medicare Other | Admitting: Nurse Practitioner

## 2023-02-20 ENCOUNTER — Telehealth: Payer: Self-pay | Admitting: Cardiology

## 2023-02-20 NOTE — Telephone Encounter (Signed)
Pt c/o medication issue:  1. Name of Medication:   amLODipine (NORVASC) 2.5 MG tablet   2. How are you currently taking this medication (dosage and times per day)?   As prescribed  3. Are you having a reaction (difficulty breathing--STAT)?   Patient stated she got very tired taking the medication at night  4. What is your medication issue?    Patient stated she gets "twitches" in her head and it is hard for her to get to sleep.  Patient said she stopped taking this medication and wants to get alternate medication.

## 2023-02-20 NOTE — Telephone Encounter (Signed)
Spoke with patient she stated she was started amlodipine. Since starting amlodipine if she takes amlodipine in the day she is fatigue. So she started taking at night and she started having twitching with her head. She stopped taking amlodipine 5 days ago and the twitching stopped. She has monitoring her BP. She has a log of BP but is currently driving. She would like for you to call her to discuss.

## 2023-02-21 NOTE — Telephone Encounter (Signed)
Spoke with patient and she is aware we are waiting for provider response.  She states she can not take amlodipine and will need to discuss next steps

## 2023-02-21 NOTE — Telephone Encounter (Signed)
Patient is calling to follow up. Patient is requesting to speak with PA Azalee Course. Please advise.

## 2023-02-23 ENCOUNTER — Encounter: Payer: Self-pay | Admitting: Physician Assistant

## 2023-02-23 ENCOUNTER — Encounter: Payer: Self-pay | Admitting: Obstetrics and Gynecology

## 2023-02-23 ENCOUNTER — Ambulatory Visit: Payer: Medicare Other | Admitting: Physician Assistant

## 2023-02-23 ENCOUNTER — Other Ambulatory Visit (HOSPITAL_COMMUNITY)
Admission: RE | Admit: 2023-02-23 | Discharge: 2023-02-23 | Disposition: A | Discharge status: Home / Self Care | Payer: Medicare Other | Source: Ambulatory Visit | Attending: Obstetrics and Gynecology | Admitting: Obstetrics and Gynecology

## 2023-02-23 ENCOUNTER — Ambulatory Visit: Payer: Medicare Other | Admitting: Obstetrics and Gynecology

## 2023-02-23 VITALS — BP 120/78 | HR 73 | Ht 65.25 in | Wt 152.0 lb

## 2023-02-23 VITALS — BP 128/70 | HR 72 | Ht 65.0 in | Wt 152.0 lb

## 2023-02-23 DIAGNOSIS — Z01419 Encounter for gynecological examination (general) (routine) without abnormal findings: Secondary | ICD-10-CM

## 2023-02-23 DIAGNOSIS — M8580 Other specified disorders of bone density and structure, unspecified site: Secondary | ICD-10-CM

## 2023-02-23 DIAGNOSIS — E041 Nontoxic single thyroid nodule: Secondary | ICD-10-CM

## 2023-02-23 DIAGNOSIS — K219 Gastro-esophageal reflux disease without esophagitis: Secondary | ICD-10-CM | POA: Diagnosis not present

## 2023-02-23 DIAGNOSIS — M800B2A Age-related osteoporosis with current pathological fracture, left pelvis, initial encounter for fracture: Secondary | ICD-10-CM

## 2023-02-23 DIAGNOSIS — K5909 Other constipation: Secondary | ICD-10-CM

## 2023-02-23 DIAGNOSIS — R198 Other specified symptoms and signs involving the digestive system and abdomen: Secondary | ICD-10-CM | POA: Diagnosis not present

## 2023-02-23 DIAGNOSIS — Z9289 Personal history of other medical treatment: Secondary | ICD-10-CM

## 2023-02-23 DIAGNOSIS — Z124 Encounter for screening for malignant neoplasm of cervix: Secondary | ICD-10-CM

## 2023-02-23 DIAGNOSIS — D071 Carcinoma in situ of vulva: Secondary | ICD-10-CM

## 2023-02-23 NOTE — Progress Notes (Addendum)
72 y.o. y.o. female here for annual exam. She denies any pm bleeding.   No LMP recorded (lmp unknown). Patient has had a hysterectomy.   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 Dr Andrey Farmer Left Post Vulva and Perianal Excision in 06/2019.  Last mammogram: 02/02/23, birads 1 neg Last colonoscopy: 2020 Dxa 2021 T -2.4 of lumbar spine. H/o Fosomax use(cannot use with hiatal hernia) Currently on no medications She is worried about side effects with the medication. Recently hospitalization for coronary spasms with minimal nonobstructive CAD  Pap: 12/22/20 Neg Hysterectomy for fibroids  Height 5' 5.25" (1.657 m), weight 152 lb (68.9 kg).     Component Value Date/Time   DIAGPAP  12/22/2020 1248    - Negative for intraepithelial lesion or malignancy (NILM)   ADEQPAP Satisfactory for evaluation. 12/22/2020 1248    GYN HISTORY:    Component Value Date/Time   DIAGPAP  12/22/2020 1248    - Negative for intraepithelial lesion or malignancy (NILM)   ADEQPAP Satisfactory for evaluation. 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   GERD (gastroesophageal reflux disease)    occasionally (per pt takes apple cider vinager)   Heart palpitations    cardiologist-- dr Herbie [place]--- event monitor 08-24-2011 epic ;  nuclear stress test 03-16-2011 (epic) normal w/ no ischemia, ef 65%;  echo 01-18-2013  ef 60-65%, G1DD   Hiatal hernia    History of subdural hematoma    12/ 23/ 2010  s/p  craniotomy w/  hematoma evacuation (pt had a fall w/ concussion)  per pt no residual   History of thyroid cancer followed by dr Pollyann Kennedy---   s/p  left thyroidectomy --- per pt no radiation and no recurrence   Hyperlipidemia    IBS (irritable bowel syndrome)    Immunization, single disease 06/09/2019   1st dose moderma vaccine administered   Insomnia    OA (osteoarthritis)    Osteopenia, T score -2.1 FRAX 9.4%/1.3% stable from prior DEXA 06/2015   Restless leg syndrome    Right thyroid nodule    followed by dr Pollyann Kennedy--- last ultrasound in epic 03-30-2018 stable , to bx   STD (sexually transmitted disease), HSV    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   Wears glasses     Past Surgical History:  Procedure Laterality Date   ABDOMINAL HYSTERECTOMY  1986   ovaries remain   BREAST REDUCTION SURGERY Bilateral 1999   CATARACT EXTRACTION W/ INTRAOCULAR LENS  IMPLANT, BILATERAL  2019   COLONOSCOPY  last one 11-22-2018   ESOPHAGOGASTRODUODENOSCOPY     KNEE ARTHROSCOPY Bilateral 2002   LEFT HEART CATH AND CORONARY ANGIOGRAPHY N/A 02/03/2023   Procedure: LEFT  HEART CATH AND CORONARY ANGIOGRAPHY;  Surgeon: Lennette Bihari, MD;  Location: [facility] INVASIVE CV LAB;  Service: Cardiovascular;  Laterality: N/A;   REDUCTION MAMMAPLASTY Bilateral    SUBDURAL HEMATOMA EVACUATION VIA CRANIOTOMY  04-30-2009  @[facility]    left frontotemporapartietal    THYROID LOBECTOMY Left 05-04-2001  @[facility]   dr Pollyann Kennedy   TONSILLECTOMY     VULVECTOMY N/A 03/12/2019   Procedure: WIDE LOCAL EXCISION OF CARCINOMA IS SITU OF VULVA;  Surgeon: Dara Lords, MD;  Location: [facility];  Service: Gynecology;  Laterality: N/A;  request to follow in [facility] block time requests one hour colposcope available in OR with acetic acid.   VULVECTOMY N/A 06/17/2019   Procedure: WIDE LOCAL  EXCISION VULVECTOMY;  Surgeon: Adolphus Birchwood, MD;  Location: [facility];  Service: Gynecology;  Laterality: N/A;     Current Outpatient Medications on File Prior to Visit  Medication Sig Dispense Refill   amLODipine (NORVASC) 2.5 MG tablet Take 1 tablet (2.5 mg total) by mouth daily. (Patient not taking: Reported on 02/23/2023) 90 tablet 3   APPLE CIDER VINEGAR PO Take 1 tablet by mouth in the morning, at noon, and at bedtime.     aspirin EC 81 MG tablet Take 1 tablet (81 mg total) by mouth daily. Swallow whole. 30 tablet 3   bisacodyl (DULCOLAX) 5 MG EC tablet Take 5 mg by mouth daily as needed for moderate constipation.     CALCIUM PO Take 2 tablets by mouth daily.     clonazePAM (KLONOPIN) 0.5 MG tablet TAKE 1 TABLET BY MOUTH EVERYDAY AT BEDTIME 90 tablet 1   CO ENZYME Q-10 PO Take 1 tablet by mouth daily.     Cyanocobalamin (VITAMIN B-12 ER PO) Take 1 tablet by mouth daily.     escitalopram (LEXAPRO) 10 MG tablet Take 10 mg by mouth at bedtime.     MAGNESIUM PO Take 1 tablet by mouth at bedtime.     Melatonin 3 MG CAPS Take 6 mg by mouth at bedtime.     metoprolol succinate (TOPROL-XL) 25 MG 24 hr tablet TAKE 1 TABLET BY MOUTH DAILY. TAKE WITH OR IMMEDIATELY FOLLOWING A MEAL. (Patient taking differently: Take 25 mg by mouth at bedtime.) 90 tablet 3   nitroGLYCERIN (NITROSTAT) 0.4 MG SL tablet Place 1 tablet (0.4 mg total) under the tongue every 5 (five) minutes x 3 doses as needed for chest pain. 20 tablet 2   OVER THE COUNTER MEDICATION Take 1 tablet by mouth at bedtime. Renew Life [place]     OVER THE COUNTER MEDICATION Take 1 tablet by mouth daily. Life extension     Polyethyl Glycol-Propyl Glycol (SYSTANE OP) Place 2 drops into both eyes as needed (dryness).     rOPINIRole (REQUIP) 0.5 MG tablet Take 1 tablet (0.5 mg total) by mouth at bedtime. 90 tablet 3   rosuvastatin (CRESTOR) 40 MG tablet Take 1 tablet (40 mg total) by mouth daily. 30 tablet 3   Turmeric (QC TUMERIC COMPLEX PO) Take 1 capsule by mouth daily.     valACYclovir (VALTREX) 500 MG tablet One tab bid as needed for outbreak  (Patient taking differently: Take 500 mg by mouth as needed (outbreaks).) 180 tablet 1   Vitamin D-Vitamin K (VITAMIN K2-VITAMIN D3 PO) Take 1 capsule by mouth daily.     No current facility-administered medications on file prior to visit.    Social History   Socioeconomic History   Marital status: Married  Spouse name: Not on file   Number of children: 2   Years of education: Not on file   Highest education level: Not on file  Occupational History   Occupation: retired  Tobacco Use   Smoking status: Never   Smokeless tobacco: Never  Vaping Use   Vaping status: Never Used  Substance and Sexual Activity   Alcohol use: Yes    Comment: 2-3 times weekly   Drug use: Never   Sexual activity: Not Currently    Partners: Male    Birth control/protection: Surgical    Comment: HYST-1st intercourse 72 yo-Fewer than 5 partners  Other Topics Concern   Not on file  Social History Narrative   Married mother of 7 with 28 year old granddaughter (born in 2019) and another due in 2022. Her son Alycia Rossetti is patient of Dr. Durene Cal       Lives with husband in a three story home, no issue with stairs.    Highest level of education is an associates.       She exercises regularly, leads an active lifestyle, has a healthy diet, and is very socially engaged. Self-described "HSP: highly sensitive person" and therefore tends to worry more about symptoms than others might.      Hobbies: enjoys time with friends, time on front porch with friends during covid, park daily, family time   Social Determinants of Health   Financial Resource Strain: Low Risk  (02/01/2022)   Overall Financial Resource Strain (CARDIA)    Difficulty of Paying Living Expenses: Not hard at all  Food Insecurity: No Food Insecurity (02/03/2023)   Hunger Vital Sign    Worried About Running Out of Food in the Last Year: Never true    Ran Out of Food in the Last Year: Never true  Transportation Needs: No Transportation Needs (02/03/2023)    PRAPARE - Administrator, Civil Service (Medical): No    Lack of Transportation (Non-Medical): No  Physical Activity: Sufficiently Active (02/01/2022)   Exercise Vital Sign    Days of Exercise per Week: 7 days    Minutes of Exercise per Session: 60 min  Stress: No Stress Concern Present (02/01/2022)   [employer] of Occupational Health - Occupational Stress Questionnaire    Feeling of Stress : Not at all  Social Connections: Not on file  Intimate Partner Violence: Not on file    Family History  Problem Relation Age of Onset   Failure to thrive Mother        33   Liver cancer Father        alcohol related likely   Hypertension Father    Heart disease Maternal Grandmother    Heart disease Maternal Grandfather    Hyperlipidemia Maternal Grandfather    Hypertension Paternal Grandfather    Diabetes Maternal Aunt    Breast cancer Maternal Aunt 60   Colon cancer Neg Hx    Thyroid disease Neg Hx      Allergies  Allergen Reactions   Ambien [Zolpidem Tartrate] Other (See Comments)    Causes Neurological problems with very bad dizziness, pains, disorientation   Cortisone Other (See Comments)    REACTION: "mania" post ESI   Eszopiclone And Related Other (See Comments)    Causes Neurological problems with very bad dizziness, pains, disorientation   Levofloxacin Other (See Comments)    Muscle spasms and jerking movements   Zolpidem Other (See Comments)    Causes Neurological problems with very bad dizziness, pains, disorientation  Dicloxacillin Other (See Comments)    Stomach problems, swelling of tongue   Dilantin [Phenytoin Sodium Extended] Hives and Itching   Erythromycin Other (See Comments)    Tears stomach up   Avelox [Moxifloxacin Hcl In Nacl] Other (See Comments)    Pt unsure of reaction.   Eszopiclone Other (See Comments)   Gluten Meal Other (See Comments)   Influenza Virus Vaccine Other (See Comments)   Isosorbide    Loratadine    Moxifloxacin  Other (See Comments)   Nefazodone Other (See Comments)   Other Other (See Comments)    Celiac disease   Phenytoin Other (See Comments)   Trazodone Other (See Comments)   Nitrofuran Derivatives Other (See Comments)    unknown   Phenytoin Sodium Extended Rash   Trazodone And Nefazodone Other (See Comments)    dizzy      Patient's last menstrual period was No LMP recorded (lmp unknown). Patient has had a hysterectomy..            Review of Systems Alls systems reviewed and are negative.     Physical Exam Constitutional:      Appearance: Normal appearance.  Genitourinary:     Vulva normal.     No lesions in the vagina.     Right Labia: No rash, lesions or skin changes.    Left Labia: No lesions, skin changes or rash.    Vaginal cuff intact.    No vaginal discharge or tenderness.     No vaginal prolapse present.    No vaginal atrophy present.     Right Adnexa: not tender and no mass present.    Left Adnexa: not tender and no mass present.    Cervix is not absent.     Uterus is not absent. Breasts:    Right: Normal.     Left: Normal.  HENT:     Head: Normocephalic.  Neck:     Thyroid: Thyroid mass present. No thyromegaly or thyroid tenderness.  Cardiovascular:     Rate and Rhythm: Normal rate and regular rhythm.     Heart sounds: Normal heart sounds, S1 normal and S2 normal.  Pulmonary:     Effort: Pulmonary effort is normal.     Breath sounds: Normal breath sounds and air entry.  Abdominal:     General: There is no distension.     Palpations: Abdomen is soft. There is no mass.     Tenderness: There is no abdominal tenderness. There is no guarding or rebound.  Musculoskeletal:        General: Normal range of motion.     Cervical back: Full passive range of motion without pain, normal range of motion and neck supple. No tenderness.     Right lower leg: No edema.     Left lower leg: No edema.  Neurological:     Mental Status: She is alert.  Skin:    General:  Skin is warm.  Psychiatric:        Mood and Affect: Mood normal.        Behavior: Behavior normal.        Thought Content: Thought content normal.  Vitals and nursing note reviewed. Exam conducted with a chaperone present.       A:         Well Woman GYN exam  P:        Pap smear collected today Encouraged annual mammogram screening Colon cancer screening up-to-date DXA ordered today  To verify with [employer] rep on recent coronary spasm if she can take that.  Also discussed reclast or Evista.  Information on [employer] and prolia were given. PA sent Labs and immunizations to do with PMD Discussed breast self exams Encouraged healthy lifestyle practices Encouraged Vit D and Calcium   No follow-ups on file.  Earley Favor

## 2023-02-23 NOTE — Patient Instructions (Signed)
Colonoscopy due 11/2023.   _______________________________________________________  If your blood pressure at your visit was 140/90 or greater, please contact your primary care physician to follow up on this.  _______________________________________________________  If you are age 73 or older, your body mass index should be between 23-30. Your Body mass index is 25.29 kg/m. If this is out of the aforementioned range listed, please consider follow up with your Primary Care Provider.  If you are age 20 or younger, your body mass index should be between 19-25. Your Body mass index is 25.29 kg/m. If this is out of the aformentioned range listed, please consider follow up with your Primary Care Provider.   ________________________________________________________  The [facility] providers would like to encourage you to use [facility] to communicate with providers for non-urgent requests or questions.  Due to long hold times on the telephone, sending your provider a message by [facility] may be a faster and more efficient way to get a response.  Please allow 48 business hours for a response.  Please remember that this is for non-urgent requests.  _______________________________________________________

## 2023-02-23 NOTE — Progress Notes (Signed)
Chief Complaint: "GI issues"  HPI:    Amanda [name] is a 72 y/o female with a past medical history as listed below including celiac disease, GERD, IBS and multiple others, known to Dr. Adela Lank, who was referred to me by Shelva Majestic, MD for a complaint of "GI issues".    06/2021 EGD for dysphagia with GJ stricture status post dilation to 18 mm.    01/12/2022 patient seen in clinic by Dr. Adela Lank with persistent cough throat irritation and question of reflux at that time discussed persistent cough and hoarseness, ENT reported lower endoscopy with nothing too concerning, pulmonary exam clear at that time tried increasing her Pantoprazole to 40 mg twice a day for a month to see if it helped.  Also given Carafate.  Also referred to pulmonary for PFTs.  Discussed 24-hour pH impedance test if she continued with cough.    02/02/2023-02/04/2023 patient admitted to the hospital for chest pain.  EKG never showed ST elevation.  Found to have nonobstructive CAD.    02/03/2023 echo with LVEF 60-65%.  Cardiac cath the same day and recommended medical therapy with aspirin.    02/04/2023 BMP is normal.  CBC normal.    Today, patient presents to clinic and tells me that she just wanted to come here to touch base on a few things.  Apparently was having some worsened epigastric pain when she made the appointment but this is now better.  Reminds me she treats her hiatal hernia and gastritis with apple cider vinegar capsules and sees a naturopath who helps her with her other conditions.      She has noticed a lot of excess noise and some burping over the past few months.  Tells me that she has chronic constipation and was started on some "cleanse move" that she takes nightly over the past 5 months which does seem to help but occasionally still needs an enema to feel empty.  Again tries to avoid prescription medicines.    Denies fever, chills or weight loss.     Procedural history: EGD in September 2021 by Dr. Briant Sites through [facility].  This was performed for complaints of pharyngeal dysphagia.  I could not feel the actual report, but I can see that duodenal biopsies were taken and showed preserved villoglandular architecture without increased intraepithelial lymphocytes and active inflammation.  Gastric biopsies showed mild chronic inflammation without H. pylori.  Distal esophageal biopsies showed esophageal squamous mucosa with basal cell hyperplasia and reparative changes with no eosinophils identified.  Proximal esophageal biopsies showed the same. Colonoscopy 11/2018-- One 5 mm polyp in the cecum, removed with a cold snare. Resected and retrieved.  One 4 to 5 mm polyp in the rectum, removed with a hot snare. Resected and retrieved.  Tattoo placed.  The examination was otherwise normal.  Cecal polyp was a sessile serrated polyp and the one from the rectum was a lipoma. repeat colonoscopy in 5 years. Modified barium swallow 03/2016 - no aspiration, mild cricopharyngeal bar EGD 06/09/15 - normal esophagus, no hiatal hernia, showed mild duodenal bulb erythema but biopsies of the 2nd portion of the duodenum and bulb showed no evidence of active celiac.  Colonoscopy 06/09/15 - 2 polyps removed on colonoscopy, the largest a 1cm sessile serrated polyp in the right colon, and another smaller adenoma. biopsies of the colon were taken and no evidence for microscopic colitis. EGD - 07/25/2011 - normal , bx negative for EoE Esophageal manometry 08/04/2010 -  reportedly normal per note of Dr Jarold Motto EGD 11/30/2009 - normal esophagus, biopsies taken to rule out EoE - path negative for reflux Colonoscopy 08/2007 - normal EGD 08/2007 - biopsies taken to rule out EoE, hiatal hernia - path negative for EoE     EGD 07/06/21: Esophagogastric landmarks were identified: the Z-line was found at 37 cm, the gastroesophageal junction was found at 37 cm and the upper extent of the gastric folds was found at 37 cm  from the incisors. Sliding 1cm hiatal hernia. - One benign-appearing, intrinsic mild stenosis was found 37 cm from the incisors. This stenosis measured less than one cm (in length). A guidewire was placed and the scope was withdrawn. Dilation was performed with a Savary dilator with mild resistance at 17 mm and 18 mm. Relook endoscopy showed an appropriate mucosal wrent at the stricture. No mucosal wrents near the proximal esophagus. - The exam of the esophagus was otherwise normal. - The entire examined stomach was normal. - The duodenal bulb and second portion of the duodenum were normal other than mild erythema of the duodenal bulb and diminutive AVM in second portion of the duodenum.   Past Medical History:  Diagnosis Date   Anemia    Anxiety    Celiac disease    Chronic constipation    COVID 02/12/2021   COVID-19 virus vaccine not available    Erythema migrans (Lyme disease) 12/17/2021   GERD (gastroesophageal reflux disease)    occasionally (per pt takes apple cider vinager)   Heart palpitations    cardiologist-- dr Herbie Baltimore--- event monitor 08-24-2011 epic ;  nuclear stress test 03-16-2011 (epic) normal w/ no ischemia, ef 65%;  echo 01-18-2013  ef 60-65%, G1DD   Hiatal hernia    History of subdural hematoma    12/ 23/ 2010  s/p  craniotomy w/ hematoma evacuation (pt had a fall w/ concussion)  per pt no residual   History of thyroid cancer followed by dr Pollyann Kennedy---   s/p  left thyroidectomy --- per pt no radiation and no recurrence   Hyperlipidemia    IBS (irritable bowel syndrome)    Immunization, single disease 06/09/2019   1st dose moderma vaccine administered   Insomnia    OA (osteoarthritis)    Osteopenia, T score -2.1 FRAX 9.4%/1.3% stable from prior DEXA 06/2015   Restless leg syndrome    Right thyroid nodule    followed by dr Pollyann Kennedy--- last ultrasound in epic 03-30-2018 stable , to bx   STD (sexually transmitted disease), HSV    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   Wears glasses     Past Surgical History:  Procedure Laterality Date   ABDOMINAL HYSTERECTOMY  1986   ovaries remain   BREAST REDUCTION SURGERY Bilateral 1999   CATARACT EXTRACTION W/ INTRAOCULAR LENS  IMPLANT, BILATERAL  2019   COLONOSCOPY  last one 11-22-2018   ESOPHAGOGASTRODUODENOSCOPY     KNEE ARTHROSCOPY Bilateral 2002   LEFT HEART CATH AND CORONARY ANGIOGRAPHY N/A 02/03/2023   Procedure: LEFT HEART CATH AND CORONARY ANGIOGRAPHY;  Surgeon: Lennette Bihari, MD;  Location: [facility];  Service: Cardiovascular;  Laterality: N/A;   REDUCTION MAMMAPLASTY Bilateral    SUBDURAL HEMATOMA EVACUATION VIA CRANIOTOMY  04-30-2009  @[facility]    left frontotemporapartietal    THYROID LOBECTOMY Left 05-04-2001  @[facility]   dr Pollyann Kennedy   TONSILLECTOMY     VULVECTOMY N/A 03/12/2019   Procedure: WIDE LOCAL EXCISION OF CARCINOMA IS SITU OF VULVA;  Surgeon: Audie Box,  Nadyne Coombes, MD;  Location: [facility];  Service: Gynecology;  Laterality: N/A;  request to follow in [facility] block time requests one hour colposcope available in OR with acetic acid.   VULVECTOMY N/A 06/17/2019   Procedure: WIDE LOCAL  EXCISION VULVECTOMY;  Surgeon: Adolphus Birchwood, MD;  Location: [facility];  Service: Gynecology;  Laterality: N/A;    Current Outpatient Medications  Medication Sig Dispense Refill   amLODipine (NORVASC) 2.5 MG tablet Take 1 tablet (2.5 mg total) by mouth daily. 90 tablet 3   APPLE CIDER VINEGAR PO Take 1 tablet by mouth in the morning, at noon, and at bedtime.     aspirin EC 81 MG tablet Take 1 tablet (81 mg total) by mouth daily. Swallow whole. 30 tablet 3   bisacodyl (DULCOLAX) 5 MG EC tablet Take 5 mg by mouth daily as needed for moderate constipation.     CALCIUM PO Take 2 tablets by mouth daily.     clonazePAM (KLONOPIN) 0.5 MG tablet TAKE 1 TABLET BY MOUTH EVERYDAY AT BEDTIME 90 tablet 1   CO ENZYME Q-10 PO Take 1 tablet by mouth daily.      Cyanocobalamin (VITAMIN B-12 ER PO) Take 1 tablet by mouth daily.     escitalopram (LEXAPRO) 10 MG tablet Take 10 mg by mouth at bedtime.     loratadine (CLARITIN) 10 MG tablet Take 10 mg by mouth daily as needed for allergies.     MAGNESIUM PO Take 1 tablet by mouth at bedtime.     Melatonin 3 MG CAPS Take 6 mg by mouth at bedtime.     metoprolol succinate (TOPROL-XL) 25 MG 24 hr tablet TAKE 1 TABLET BY MOUTH DAILY. TAKE WITH OR IMMEDIATELY FOLLOWING A MEAL. (Patient taking differently: Take 25 mg by mouth at bedtime.) 90 tablet 3   nitroGLYCERIN (NITROSTAT) 0.4 MG SL tablet Place 1 tablet (0.4 mg total) under the tongue every 5 (five) minutes x 3 doses as needed for chest pain. 20 tablet 2   OVER THE COUNTER MEDICATION Take 1 tablet by mouth at bedtime. Renew Life Cleanse[place]     OVER THE COUNTER MEDICATION Take 1 tablet by mouth daily. Life extension     Polyethyl Glycol-Propyl Glycol (SYSTANE OP) Place 2 drops into both eyes as needed (dryness).     rOPINIRole (REQUIP) 0.5 MG tablet Take 1 tablet (0.5 mg total) by mouth at bedtime. 90 tablet 3   rosuvastatin (CRESTOR) 40 MG tablet Take 1 tablet (40 mg total) by mouth daily. 30 tablet 3   Turmeric (QC TUMERIC COMPLEX PO) Take 1 capsule by mouth daily.     valACYclovir (VALTREX) 500 MG tablet One tab bid as needed for outbreak (Patient taking differently: Take 500 mg by mouth as needed (outbreaks).) 180 tablet 1   Vitamin D-Vitamin K (VITAMIN K2-VITAMIN D3 PO) Take 1 capsule by mouth daily.     No current facility-administered medications for this visit.    Allergies as of 02/23/2023 - Review Complete 02/10/2023  Allergen Reaction Noted   Ambien [zolpidem tartrate] Other (See Comments) 10/16/2012   Cortisone Other (See Comments) 07/22/2009   Eszopiclone and related Other (See Comments) 01/17/2013   Levofloxacin Other (See Comments) 12/27/2022   Zolpidem Other (See Comments) 10/16/2012   Dicloxacillin Other (See Comments) 02/02/2011    Dilantin [phenytoin sodium extended] Hives and Itching 02/16/2012   Erythromycin Other (See Comments) 10/03/2006   Avelox [moxifloxacin hcl in nacl] Other (See Comments) 09/26/2013  Eszopiclone Other (See Comments) 12/27/2022   Gluten meal Other (See Comments) 02/03/2023   Influenza virus vaccine Other (See Comments) 12/27/2022   Moxifloxacin Other (See Comments) 12/27/2022   Nefazodone Other (See Comments) 12/27/2022   Other Other (See Comments) 01/21/2015   Phenytoin Other (See Comments) 12/27/2022   Trazodone Other (See Comments) 12/27/2022   Nitrofuran derivatives Other (See Comments) 10/16/2012   Phenytoin sodium extended Rash 02/02/2011   Trazodone and nefazodone Other (See Comments) 10/16/2012    Family History  Problem Relation Age of Onset   Failure to thrive Mother        41   Liver cancer Father        alcohol related likely   Hypertension Father    Heart disease Maternal Grandmother    Heart disease Maternal Grandfather    Hyperlipidemia Maternal Grandfather    Hypertension Paternal Grandfather    Diabetes Maternal Aunt    Breast cancer Maternal Aunt 60   Colon cancer Neg Hx    Thyroid disease Neg Hx     Social History   Socioeconomic History   Marital status: Married    Spouse name: Not on file   Number of children: 2   Years of education: Not on file   Highest education level: Not on file  Occupational History   Occupation: retired  Tobacco Use   Smoking status: Never   Smokeless tobacco: Never  Vaping Use   Vaping status: Never Used  Substance and Sexual Activity   Alcohol use: Yes    Comment: 2-3 times weekly   Drug use: Never   Sexual activity: Not Currently    Partners: Male    Birth control/protection: Surgical    Comment: HYST-1st intercourse 72 yo-Fewer than 5 partners  Other Topics Concern   Not on file  Social History Narrative   Married mother of 12 with 59 year old granddaughter (born in 2019) and another due in 2022. Her son Alycia Rossetti is  patient of Dr. Durene Cal       Lives with husband in a three story home, no issue with stairs.    Highest level of education is an associates.       She exercises regularly, leads an active lifestyle, has a healthy diet, and is very socially engaged. Self-described "HSP: highly sensitive person" and therefore tends to worry more about symptoms than others might.      Hobbies: enjoys time with friends, time on front porch with friends during covid, park daily, family time   Social Determinants of Health   Financial Resource Strain: Low Risk  (02/01/2022)   Overall Financial Resource Strain (CARDIA)    Difficulty of Paying Living Expenses: Not hard at all  Food Insecurity: No Food Insecurity (02/03/2023)   Hunger Vital Sign    Worried About Running Out of Food in the Last Year: Never true    Ran Out of Food in the Last Year: Never true  Transportation Needs: No Transportation Needs (02/03/2023)   PRAPARE - Administrator, Civil Service (Medical): No    Lack of Transportation (Non-Medical): No  Physical Activity: Sufficiently Active (02/01/2022)   Exercise Vital Sign    Days of Exercise per Week: 7 days    Minutes of Exercise per Session: 60 min  Stress: No Stress Concern Present (02/01/2022)   [employer] of Occupational Health - Occupational Stress Questionnaire    Feeling of Stress : Not at all  Social Connections: Not on file  Intimate  Partner Violence: Not on file    Review of Systems:    Constitutional: No weight loss, fever or chills Cardiovascular: No chest pain Respiratory: No SOB  Gastrointestinal: See HPI and otherwise negative   Physical Exam:  Vital signs: BP 128/70 (BP Location: Left Arm, Patient Position: Sitting, Cuff Size: Normal)   Pulse 72   Ht 5\' 5"  (1.651 m)   Wt 152 lb (68.9 kg)   LMP  (LMP Unknown)   BMI 25.29 kg/m    Constitutional:   Pleasant elderly Caucasian female appears to be in NAD, Well developed, Well nourished, alert and  cooperative Respiratory: Respirations even and unlabored. Lungs clear to auscultation bilaterally.   No wheezes, crackles, or rhonchi.  Cardiovascular: Normal S1, S2. No MRG. Regular rate and rhythm. No peripheral edema, cyanosis or pallor.  Gastrointestinal:  Soft, nondistended, nontender. No rebound or guarding. Normal bowel sounds. No appreciable masses or hepatomegaly. Rectal:  Not performed.  Psychiatric: Demonstrates good judgement and reason without abnormal affect or behaviors.  RELEVANT LABS AND IMAGING: CBC    Component Value Date/Time   WBC 8.0 02/04/2023 0242   RBC 4.06 02/04/2023 0242   HGB 12.1 02/04/2023 0242   HCT 37.2 02/04/2023 0242   PLT 208 02/04/2023 0242   MCV 91.6 02/04/2023 0242   MCH 29.8 02/04/2023 0242   MCHC 32.5 02/04/2023 0242   RDW 12.7 02/04/2023 0242   LYMPHSABS 1.5 02/02/2023 1856   MONOABS 0.3 02/02/2023 1856   EOSABS 0.1 02/02/2023 1856   BASOSABS 0.0 02/02/2023 1856    CMP     Component Value Date/Time   NA 137 02/04/2023 0242   NA 143 03/04/2016 0000   K 3.9 02/04/2023 0242   CL 106 02/04/2023 0242   CO2 23 02/04/2023 0242   GLUCOSE 113 (H) 02/04/2023 0242   BUN 13 02/04/2023 0242   BUN 18 03/04/2016 0000   CREATININE 0.85 02/04/2023 0242   CREATININE 0.76 02/14/2020 1603   CALCIUM 8.8 (L) 02/04/2023 0242   PROT 8.7 (H) 02/02/2023 1856   ALBUMIN 4.7 02/02/2023 1856   AST 41 02/02/2023 1856   ALT 19 02/02/2023 1856   ALKPHOS 82 02/02/2023 1856   BILITOT 0.9 02/02/2023 1856   GFRNONAA >60 02/04/2023 0242   GFRNONAA 80 02/14/2020 1603   GFRAA 93 02/14/2020 1603    Assessment: 1.  Gas/borborygmi: Increased recently, thought possibly related to chronic constipation +/- gastritis +/- diet 2.  History of hiatal hernia and gastritis: Patient uses apple cider vinegar caps to control this 3.  Chronic constipation: Some better with "cleanse move" tablets nightly but occasionally still has to use an enema, likely contributing to gas  above 4.  History of adenomatous polyps: Repeat recommended in July 2025  Plan: 1.  Patient prefers naturopathic remedies.  Discussed aloe vera juice and DGL licorice for epigastric pain in the future. 2.  Discussed that a lot of her gas could be related to ongoing constipation.  Recommend that she try MiraLAX daily in addition to her cleanse tablets.  She would like to avoid prescription meds. 3.  Patient to follow in clinic with [place] in July of next year for her repeat colonoscopy given history of polyps. 4.  Patient will follow-up with [place] as needed in the meantime.  Hyacinth Meeker, PA-C [facility] 02/23/2023, 10:07 AM  Cc: Shelva Majestic, MD

## 2023-02-24 NOTE — Progress Notes (Signed)
Agree with assessment / plan as outlined.  

## 2023-02-24 NOTE — Addendum Note (Signed)
Addended by: Earley Favor on: 02/24/2023 10:29 AM   Modules accepted: Orders

## 2023-02-27 DIAGNOSIS — R490 Dysphonia: Secondary | ICD-10-CM | POA: Diagnosis not present

## 2023-02-27 DIAGNOSIS — K219 Gastro-esophageal reflux disease without esophagitis: Secondary | ICD-10-CM | POA: Diagnosis not present

## 2023-02-28 ENCOUNTER — Telehealth: Payer: Self-pay | Admitting: *Deleted

## 2023-02-28 LAB — CYTOLOGY - PAP: Diagnosis: NEGATIVE

## 2023-02-28 NOTE — Telephone Encounter (Signed)
-----   Message from Earley Favor sent at 02/23/2023  2:20 PM EDT ----- Can you run PA on Evenity and prolia She cannot take fosomax and needs something.  Currently on no medication. Dr. Karma Greaser

## 2023-02-28 NOTE — Telephone Encounter (Signed)
Insurance information submitted to [employer] portal. Will await summary of benefits for prolia.    

## 2023-03-01 ENCOUNTER — Other Ambulatory Visit: Payer: Self-pay | Admitting: Physician Assistant

## 2023-03-01 NOTE — Telephone Encounter (Signed)
I have called and spoke with the patient, she had head shaking shaking symptom after started on the amlodipine and have self discontinued amlodipine a few days ago.  The symptoms resolved.  I am not confident that her symptom was truly related to amlodipine or she could have had neck spasm.  However her systolic blood pressure has been in the 110s range and that she has not had any recurrent chest pain, I am okay with her stopping the amlodipine.  Amlodipine was initially prescribed for possible coronary vasospasm.  She could not tolerate Imdur.  I have removed amlodipine from her medication list.  She has upcoming fasting lipid panel and LFT in the follow-up with Dr. Herbie Baltimore in January.

## 2023-03-02 ENCOUNTER — Ambulatory Visit: Payer: Medicare Other | Admitting: Family Medicine

## 2023-03-02 ENCOUNTER — Encounter: Payer: Self-pay | Admitting: Family Medicine

## 2023-03-02 VITALS — BP 132/84 | HR 65 | Temp 98.0°F | Ht 65.25 in | Wt 152.0 lb

## 2023-03-02 DIAGNOSIS — S90852A Superficial foreign body, left foot, initial encounter: Secondary | ICD-10-CM

## 2023-03-02 DIAGNOSIS — M2550 Pain in unspecified joint: Secondary | ICD-10-CM | POA: Insufficient documentation

## 2023-03-02 DIAGNOSIS — E663 Overweight: Secondary | ICD-10-CM | POA: Insufficient documentation

## 2023-03-02 NOTE — Patient Instructions (Signed)
Follow up as needed or as scheduled Keep area clean and dry Monitor for any redness, oozing, or worsening pain Thankfully you are up to date on your tetanus shot Call with any questions or concerns Have a great day!

## 2023-03-02 NOTE — Progress Notes (Signed)
Subjective:    Patient ID: Amanda [name], female    DOB: 1950-07-11, 72 y.o.   MRN: [id number]  HPI L foot pain- pt stepped on piece of broken ceramic on the kitchen floor on Tuesday.  Pt was barefoot.  UTD on Td.  No bleeding.  Was able to remove piece of ceramic.  Applied Melaleuca and also put foot in baking soda and warm water.  Has some discomfort w/ walking.  Not sure if there is still ceramic in her foot   Review of Systems For ROS see HPI     Objective:   Physical Exam Vitals reviewed.  Constitutional:      General: She is not in acute distress.    Appearance: Normal appearance. She is not ill-appearing.  HENT:     Head: Normocephalic and atraumatic.  Cardiovascular:     Pulses: Normal pulses.  Skin:    General: Skin is warm and dry.     Comments: Plantar surface of L foot proximal to 3rd metatarsal head very TTP w/ pinpoint dark area.  Skin cleaned w/ alcohol pad and using a 23 gauge needle- bevel up- scraped over the area until small sliver of ceramic removed.  Pain immediately resolved.  No blood.  Pt tolerated w/o difficulty  Neurological:     General: No focal deficit present.     Mental Status: She is alert and oriented to person, place, and time.           Assessment & Plan:  Foreign body L foot- new.  Small shard of ceramic in plantar surface of L foot as above.  This was easily removed using 23 gauge needle to scrape the surface.  No bleeding.  Pt tolerated w/o difficulty.  Pain instantly improved.  Tdap UTD

## 2023-03-22 DIAGNOSIS — Z79899 Other long term (current) drug therapy: Secondary | ICD-10-CM | POA: Diagnosis not present

## 2023-03-23 ENCOUNTER — Telehealth: Payer: Self-pay | Admitting: Cardiology

## 2023-03-23 LAB — HEPATIC FUNCTION PANEL
ALT: 13 [IU]/L (ref 0–32)
AST: 24 [IU]/L (ref 0–40)
Albumin: 4.6 g/dL (ref 3.8–4.8)
Alkaline Phosphatase: 79 [IU]/L (ref 44–121)
Bilirubin Total: 0.5 mg/dL (ref 0.0–1.2)
Bilirubin, Direct: 0.17 mg/dL (ref 0.00–0.40)
Total Protein: 7 g/dL (ref 6.0–8.5)

## 2023-03-23 LAB — LIPID PANEL
Chol/HDL Ratio: 2.6 {ratio} (ref 0.0–4.4)
Cholesterol, Total: 224 mg/dL — ABNORMAL HIGH (ref 100–199)
HDL: 85 mg/dL (ref >39)
LDL Chol Calc (NIH): 126 mg/dL — ABNORMAL HIGH (ref 0–99)
Triglycerides: 75 mg/dL (ref 0–149)
VLDL Cholesterol Cal: 13 mg/dL (ref 5–40)

## 2023-03-23 NOTE — Telephone Encounter (Signed)
Patient identification verified by 2 forms. Marilynn Rail, RN   Called and spoke to patient  Patient states:   -Received cholesterol results   -did not tolerate rosuvastatin 40mg , made her dizzy   -would like PA Meng to order additional lab for in depth analysis for cholesterol levels   -has additional questions she would like to discuss with PA Lisabeth Devoid  Informed patient message sent, may need a telephone appointment to discuss additional questions  Patient verbalized understanding, no questions at this time

## 2023-03-23 NOTE — Telephone Encounter (Signed)
Patient wants call back to discuss getting futher testing on blood drawn at last lab.

## 2023-03-24 ENCOUNTER — Other Ambulatory Visit: Payer: Self-pay | Admitting: Physician Assistant

## 2023-03-24 NOTE — Telephone Encounter (Signed)
I have called the patient, she is currently taking crestor 20mg  daily. She says she has shaking episode and her mind feels foggy on the higher dose of crestor. Although total cholesterol and LDL has significant improved, however LDL still not at goal of < 70. Patient is interested in additional blood work such as apoB. I would like for her to see our lipid specialist Dr. Rennis Golden. Will make referral. Dr. Rennis Golden will discuss with her additional blood work.

## 2023-03-27 NOTE — Telephone Encounter (Signed)
Patient has been scheduled for lipid consult with Dr. Rennis Golden on 04/25/23

## 2023-03-27 NOTE — Telephone Encounter (Signed)
Message has been sent to [facility] (scheduler) to assist with lipid clinic appt

## 2023-03-30 ENCOUNTER — Other Ambulatory Visit: Payer: Self-pay | Admitting: Family Medicine

## 2023-03-30 DIAGNOSIS — B009 Herpesviral infection, unspecified: Secondary | ICD-10-CM

## 2023-03-31 ENCOUNTER — Other Ambulatory Visit: Payer: Self-pay | Admitting: Family Medicine

## 2023-04-10 DIAGNOSIS — M1712 Unilateral primary osteoarthritis, left knee: Secondary | ICD-10-CM | POA: Diagnosis not present

## 2023-04-13 NOTE — Telephone Encounter (Signed)
Spoke with Gypsy Decant Rep for [employer]. States due to patient having Coronary spasm in September, advised would avoid starting Evenity at this time.   Routing to provider for review.

## 2023-04-18 NOTE — Telephone Encounter (Signed)
Insurance information submitted to [employer] portal. Will await summary of benefits for prolia.    

## 2023-04-20 NOTE — Telephone Encounter (Signed)
[employer] requesting medical insurance information. Primary and secondary insurance cards faxed to [employer] at [phone].

## 2023-04-20 NOTE — Telephone Encounter (Signed)
Call to patient. Patient declines to schedule Prolia injection at this time. States she would like to have bone density exam prior to scheduling. Order is present for BMD. Contact information given for [facility]. Patient will call and schedule appointment.

## 2023-04-20 NOTE — Telephone Encounter (Signed)
Deductible:  $240 of $240 met   OOP MAX: none  Annual exam: 02-23-23 EB  Calcium:      8.8      Date: 02-04-23   Upcoming dental procedures:   Hx of Kidney Disease:  Last Bone Density Scan:   Is Prior Authorization needed:   Pt estimated Cost:        Coverage Details:

## 2023-04-24 ENCOUNTER — Telehealth: Payer: Self-pay | Admitting: Family Medicine

## 2023-04-24 MED ORDER — ESCITALOPRAM OXALATE 10 MG PO TABS: 10.0000 mg | ORAL_TABLET | Freq: Every day | ORAL | 5 refills | Status: DC

## 2023-04-24 NOTE — Telephone Encounter (Signed)
Refill sent to requested pharmacy.

## 2023-04-24 NOTE — Telephone Encounter (Signed)
Patient states the following medication has been discussed with PCP to prescribe:  Prescription Request  04/24/2023  LOV: Visit date not found  What is the name of the medication or equipment? escitalopram (LEXAPRO) 10 MG tablet   Have you contacted your pharmacy to request a refill? Yes   Which pharmacy would you like this sent to?  [id number] [place], [place] - [address] [place] [address] Phone: [phone] Fax: [phone]   Patient notified that their request is being sent to the clinical staff for review and that they should receive a response within 2 business days.   Please advise at Mobile [phone] (mobile)

## 2023-04-25 ENCOUNTER — Telehealth: Payer: Self-pay | Admitting: Internal Medicine

## 2023-04-25 ENCOUNTER — Ambulatory Visit (INDEPENDENT_AMBULATORY_CARE_PROVIDER_SITE_OTHER): Payer: Medicare Other | Admitting: Internal Medicine

## 2023-04-25 VITALS — BP 108/80 | HR 70 | Ht 64.0 in | Wt 154.4 lb

## 2023-04-25 DIAGNOSIS — E785 Hyperlipidemia, unspecified: Secondary | ICD-10-CM

## 2023-04-25 DIAGNOSIS — I10 Essential (primary) hypertension: Secondary | ICD-10-CM | POA: Diagnosis not present

## 2023-04-25 DIAGNOSIS — I2510 Atherosclerotic heart disease of native coronary artery without angina pectoris: Secondary | ICD-10-CM

## 2023-04-25 NOTE — Patient Instructions (Signed)
Medication Instructions:  HOLD your crestor for 2-3 weeks (statin holiday) Please contact our office with an update on how you are feeling OFF the medication  Over-the-counter to try: Cholestoff Metamucil  *If you need a refill on your cardiac medications before your next appointment, please call your pharmacy*   Lab Work: FASTING lab work in about 3 months -- NMR lipoprofile, LPa, ApoB  If you have labs (blood work) drawn today and your tests are completely normal, you will receive your results only by: MyChart Message (if you have MyChart) OR A paper copy in the mail If you have any lab test that is abnormal or we need to change your treatment, we will call you to review the results.   Testing/Procedures: Genetic Test -- results available in about 2-3 weeks   Follow-Up: At [facility], you and your health needs are our priority.  As part of our continuing mission to provide you with exceptional heart care, we have created designated Provider Care Teams.  These Care Teams include your primary Cardiologist (physician) and Advanced Practice Providers (APPs -  Physician Assistants and Nurse Practitioners) who all work together to provide you with the care you need, when you need it.  We recommend signing up for the patient portal called "MyChart".  Sign up information is provided on this After Visit Summary.  MyChart is used to connect with patients for Virtual Visits (Telemedicine).  Patients are able to view lab/test results, encounter notes, upcoming appointments, etc.  Non-urgent messages can be sent to your provider as well.   To learn more about what you can do with MyChart, go to [email or web address].    Your next appointment:    3 months with Dr. Rennis Golden -- [facility] OR [facility] follow up

## 2023-04-25 NOTE — Telephone Encounter (Signed)
Per review of Epic, patient is scheduled for BMD on 05-15-23.

## 2023-04-25 NOTE — Progress Notes (Unsigned)
LIPID CLINIC CONSULT NOTE  Chief Complaint:  Manage dyslipidemia  Primary Care Physician: Shelva Majestic, MD  Primary Cardiologist:  Bryan Lemma, MD  HPI:  Amanda [name] is a 72 y.o. female who is being seen today for the evaluation of dyslipidemia at the request of Shelva Majestic, MD. this is a pleasant 72 year old female kindly referred by Azalee Course, PA-C but followed by Dr. Herbie Baltimore.  She has a history of minimal nonobstructive coronary artery disease by cath in September 2024.  Labs in November showed total cholesterol 224, triglycerides 75, HDL 85 and LDL 126.  This is an improvement from 163 however remains above a target LDL less than 70.  She is currently on rosuvastatin 40 mg daily.  However, compliance with the medicine is reduced because she has noted some possible side effects.  PMHx:  Past Medical History:  Diagnosis Date   Anemia    Anxiety    Celiac disease    Chronic constipation    Coronary artery spasm (HCC)    COVID 02/12/2021   COVID-19 virus vaccine not available    Erythema migrans (Lyme disease) 12/17/2021   GERD (gastroesophageal reflux disease)    occasionally (per pt takes apple cider vinager)   Heart palpitations    cardiologist-- dr Herbie Baltimore--- event monitor 08-24-2011 epic ;  nuclear stress test 03-16-2011 (epic) normal w/ no ischemia, ef 65%;  echo 01-18-2013  ef 60-65%, G1DD   Hiatal hernia    History of subdural hematoma    12/ 23/ 2010  s/p  craniotomy w/ hematoma evacuation (pt had a fall w/ concussion)  per pt no residual   History of thyroid cancer followed by dr Pollyann Kennedy---   s/p  left thyroidectomy --- per pt no radiation and no recurrence   Hyperlipidemia    Hypertension    IBS (irritable bowel syndrome)    Immunization, single disease 06/09/2019   1st dose moderma vaccine administered   Insomnia    OA (osteoarthritis)    Osteopenia, T score -2.1 FRAX 9.4%/1.3% stable from prior DEXA 06/2015   Restless leg syndrome     Right thyroid nodule    followed by dr Pollyann Kennedy--- last ultrasound in epic 03-30-2018 stable , to bx   STD (sexually transmitted disease), HSV    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   Wears glasses     Past Surgical History:  Procedure Laterality Date   ABDOMINAL HYSTERECTOMY  1986   ovaries remain   BREAST REDUCTION SURGERY Bilateral 1999   CATARACT EXTRACTION W/ INTRAOCULAR LENS  IMPLANT, BILATERAL  2019   COLONOSCOPY  last one 11-22-2018   ESOPHAGOGASTRODUODENOSCOPY     KNEE ARTHROSCOPY Bilateral 2002   LEFT HEART CATH AND CORONARY ANGIOGRAPHY N/A 02/03/2023   Procedure: LEFT HEART CATH AND CORONARY ANGIOGRAPHY;  Surgeon: Lennette Bihari, MD;  Location: [facility];  Service: Cardiovascular;  Laterality: N/A;   REDUCTION MAMMAPLASTY Bilateral    SUBDURAL HEMATOMA EVACUATION VIA CRANIOTOMY  04-30-2009  @[facility]    left frontotemporapartietal    THYROID LOBECTOMY Left 05-04-2001  @[facility]   dr Pollyann Kennedy   TONSILLECTOMY     VULVECTOMY N/A 03/12/2019   Procedure: WIDE LOCAL EXCISION OF CARCINOMA IS SITU OF VULVA;  Surgeon: Dara Lords, MD;  Location: [facility];  Service: Gynecology;  Laterality: N/A;  request to follow in [facility] block time requests one hour colposcope available in OR with acetic acid.   VULVECTOMY N/A 06/17/2019  Procedure: WIDE LOCAL  EXCISION VULVECTOMY;  Surgeon: Adolphus Birchwood, MD;  Location: [facility];  Service: Gynecology;  Laterality: N/A;    FAMHx:  Family History  Problem Relation Age of Onset   Failure to thrive Mother        83   Liver cancer Father        alcohol related likely   Hypertension Father    Heart disease Maternal Grandmother    Heart disease Maternal Grandfather    Hyperlipidemia Maternal Grandfather    Hypertension Paternal Grandfather    Diabetes Maternal Aunt    Breast cancer Maternal Aunt 40   Colon cancer Neg Hx    Thyroid disease Neg Hx     SOCHx:   reports that she  has never smoked. She has never used smokeless tobacco. She reports current alcohol use. She reports that she does not use drugs.  ALLERGIES:  Allergies  Allergen Reactions   Ambien [Zolpidem Tartrate] Other (See Comments)    Causes Neurological problems with very bad dizziness, pains, disorientation   Cortisone Other (See Comments)    REACTION: "mania" post ESI   Eszopiclone And Related Other (See Comments)    Causes Neurological problems with very bad dizziness, pains, disorientation   Levofloxacin Other (See Comments)    Muscle spasms and jerking movements   Zolpidem Other (See Comments)    Causes Neurological problems with very bad dizziness, pains, disorientation   Dicloxacillin Other (See Comments)    Stomach problems, swelling of tongue   Dilantin [Phenytoin Sodium Extended] Hives and Itching   Erythromycin Other (See Comments)    Tears stomach up   Amlodipine     Head jerks at night   Avelox [Moxifloxacin Hcl In Nacl] Other (See Comments)    Pt unsure of reaction.   Eszopiclone Other (See Comments)   Gluten Meal Other (See Comments)   Influenza Virus Vaccine Other (See Comments)   Isosorbide    Loratadine    Moxifloxacin Other (See Comments)   Nefazodone Other (See Comments)   Other Other (See Comments)    Celiac disease   Phenytoin Other (See Comments)   Trazodone Other (See Comments)   Nitrofuran Derivatives Other (See Comments)    unknown   Phenytoin Sodium Extended Rash   Trazodone And Nefazodone Other (See Comments)    dizzy    ROS: Pertinent items noted in HPI and remainder of comprehensive ROS otherwise negative.  HOME MEDS: Current Outpatient Medications on File Prior to Visit  Medication Sig Dispense Refill   APPLE CIDER VINEGAR PO Take 1 tablet by mouth in the morning, at noon, and at bedtime.     aspirin EC 81 MG tablet Take 1 tablet (81 mg total) by mouth daily. Swallow whole. 30 tablet 3   bisacodyl (DULCOLAX) 5 MG EC tablet Take 5 mg by mouth  daily as needed for moderate constipation.     CALCIUM PO Take 2 tablets by mouth daily.     clonazePAM (KLONOPIN) 0.5 MG tablet TAKE 1 TABLET BY MOUTH EVERYDAY AT BEDTIME 90 tablet 1   CO ENZYME Q-10 PO Take 1 tablet by mouth daily.     Cyanocobalamin (VITAMIN B-12 ER PO) Take 1 tablet by mouth daily.     escitalopram (LEXAPRO) 10 MG tablet Take 1 tablet (10 mg total) by mouth at bedtime. 30 tablet 5   MAGNESIUM PO Take 1 tablet by mouth at bedtime.     Melatonin 3 MG CAPS Take 6 mg  by mouth at bedtime.     metoprolol succinate (TOPROL-XL) 25 MG 24 hr tablet TAKE 1 TABLET BY MOUTH DAILY. TAKE WITH OR IMMEDIATELY FOLLOWING A MEAL. (Patient taking differently: Take 25 mg by mouth at bedtime.) 90 tablet 3   nitroGLYCERIN (NITROSTAT) 0.4 MG SL tablet Place 1 tablet (0.4 mg total) under the tongue every 5 (five) minutes x 3 doses as needed for chest pain. 20 tablet 2   OVER THE COUNTER MEDICATION Take 1 tablet by mouth at bedtime. Renew Life [place]     OVER THE COUNTER MEDICATION Take 1 tablet by mouth daily. Life extension     Polyethyl Glycol-Propyl Glycol (SYSTANE OP) Place 2 drops into both eyes as needed (dryness).     rOPINIRole (REQUIP) 0.5 MG tablet Take 1 tablet (0.5 mg total) by mouth at bedtime. 90 tablet 3   rosuvastatin (CRESTOR) 40 MG tablet Take 1 tablet (40 mg total) by mouth daily. 30 tablet 3   Turmeric (QC TUMERIC COMPLEX PO) Take 1 capsule by mouth daily.     valACYclovir (VALTREX) 500 MG tablet ONE TAB TWICE A DAY AS NEEDED FOR OUTBREAK 180 tablet 1   Vitamin D-Vitamin K (VITAMIN K2-VITAMIN D3 PO) Take 1 capsule by mouth daily.     No current facility-administered medications on file prior to visit.    LABS/IMAGING: No results found for this or any previous visit (from the past 48 hours). No results found.  LIPID PANEL:    Component Value Date/Time   CHOL 224 (H) 03/22/2023 1048   CHOL 315 (H) 05/15/2015 1136   TRIG 75 03/22/2023 1048   TRIG 92 05/15/2015 1136    TRIG 70 04/12/2006 1129   HDL 85 03/22/2023 1048   HDL 84 05/15/2015 1136   CHOLHDL 2.6 03/22/2023 1048   CHOLHDL 3.3 02/03/2023 0416   VLDL 13 02/03/2023 0416   LDLCALC 126 (H) 03/22/2023 1048   LDLCALC 121 (H) 02/14/2020 1603   LDLCALC 213 (H) 05/15/2015 1136   LDLDIRECT 139.2 03/10/2008 1036    WEIGHTS: Wt Readings from Last 3 Encounters:  04/25/23 154 lb 6.4 oz (70 kg)  03/02/23 152 lb (68.9 kg)  02/23/23 152 lb (68.9 kg)    VITALS: BP 108/80   Pulse 70   Ht 5\' 4"  (1.626 m)   Wt 154 lb 6.4 oz (70 kg)   LMP  (LMP Unknown)   SpO2 95%   BMI 26.50 kg/m   EXAM: Deferred  EKG: Deferred  ASSESSMENT: Mixed dyslipidemia, goal LDL less than 70 Minimal nonobstructive coronary disease by cath (01/2023)  PLAN: 1.   Ms. Rieser has a mixed dyslipidemia but remains above a target LDL less than 70.  Fortunately she had only mild coronary artery disease.  She needs further lipid lowering, however she is not tolerating her Crestor.  I advised a statin holiday for 2 to 3 weeks.  If she is feeling better off medication, we will have to consider alternatives.  She asked about over-the-counter options and we suggested cholesterol for Metamucil.  She should avoid red yeast rice as it contains natural statin.  Will plan otherwise following up with fasting lab work in about 3 months including a lipid profile, LPA and APO B.  We also gust genetic testing today.  She reported interest in that.  Will plan to proceed understanding that it is not covered by insurance that the out-of-pocket cost would be $299.  Thanks again for the kind referral.  Chrystie Nose, MD, [facility],  [facility]  [facility]  Medical Director of the Advanced Lipid [facility] &  Cardiovascular Risk Reduction Clinic Diplomate of the American Board of Clinical Lipidology Attending Cardiologist  Direct Dial: [phone]  Fax: [phone]  Website:  [email or web address] Nicely Gayleen Sholtz 04/25/2023, 8:56 AM

## 2023-04-25 NOTE — Telephone Encounter (Signed)
Genetic test for dyslipidemia/ascvd panel ordered ([facility]) Cheek swab completed in office Specimen and necessary paperwork mailed. ID: [id number]

## 2023-05-09 ENCOUNTER — Other Ambulatory Visit: Payer: Self-pay | Admitting: Home Health

## 2023-05-12 DIAGNOSIS — M25562 Pain in left knee: Secondary | ICD-10-CM | POA: Diagnosis not present

## 2023-05-14 DIAGNOSIS — M25562 Pain in left knee: Secondary | ICD-10-CM | POA: Diagnosis not present

## 2023-05-15 ENCOUNTER — Ambulatory Visit (HOSPITAL_BASED_OUTPATIENT_CLINIC_OR_DEPARTMENT_OTHER)
Admission: RE | Admit: 2023-05-15 | Discharge: 2023-05-15 | Disposition: A | Discharge status: Home / Self Care | Payer: Medicare Other | Source: Ambulatory Visit | Attending: Obstetrics and Gynecology | Admitting: Obstetrics and Gynecology

## 2023-05-15 DIAGNOSIS — M8580 Other specified disorders of bone density and structure, unspecified site: Secondary | ICD-10-CM | POA: Insufficient documentation

## 2023-05-15 DIAGNOSIS — M800B2A Age-related osteoporosis with current pathological fracture, left pelvis, initial encounter for fracture: Secondary | ICD-10-CM | POA: Diagnosis not present

## 2023-05-15 DIAGNOSIS — Z780 Asymptomatic menopausal state: Secondary | ICD-10-CM | POA: Diagnosis not present

## 2023-05-15 DIAGNOSIS — M85852 Other specified disorders of bone density and structure, left thigh: Secondary | ICD-10-CM | POA: Diagnosis not present

## 2023-05-17 ENCOUNTER — Other Ambulatory Visit: Payer: Self-pay | Admitting: Family Medicine

## 2023-05-19 DIAGNOSIS — S83242A Other tear of medial meniscus, current injury, left knee, initial encounter: Secondary | ICD-10-CM | POA: Diagnosis not present

## 2023-05-19 DIAGNOSIS — M1712 Unilateral primary osteoarthritis, left knee: Secondary | ICD-10-CM | POA: Diagnosis not present

## 2023-06-01 DIAGNOSIS — M1712 Unilateral primary osteoarthritis, left knee: Secondary | ICD-10-CM | POA: Diagnosis not present

## 2023-06-05 ENCOUNTER — Encounter (HOSPITAL_BASED_OUTPATIENT_CLINIC_OR_DEPARTMENT_OTHER): Payer: Self-pay | Admitting: Internal Medicine

## 2023-06-05 ENCOUNTER — Ambulatory Visit: Payer: Medicare Other | Attending: Cardiology | Admitting: Cardiology

## 2023-06-05 ENCOUNTER — Encounter: Payer: Self-pay | Admitting: Cardiology

## 2023-06-05 ENCOUNTER — Encounter: Payer: Self-pay | Admitting: Internal Medicine

## 2023-06-05 VITALS — BP 136/62 | HR 63 | Ht 65.0 in | Wt 152.0 lb

## 2023-06-05 DIAGNOSIS — R002 Palpitations: Secondary | ICD-10-CM | POA: Insufficient documentation

## 2023-06-05 DIAGNOSIS — E785 Hyperlipidemia, unspecified: Secondary | ICD-10-CM | POA: Insufficient documentation

## 2023-06-05 DIAGNOSIS — I2510 Atherosclerotic heart disease of native coronary artery without angina pectoris: Secondary | ICD-10-CM | POA: Diagnosis not present

## 2023-06-05 DIAGNOSIS — I214 Non-ST elevation (NSTEMI) myocardial infarction: Secondary | ICD-10-CM | POA: Insufficient documentation

## 2023-06-05 DIAGNOSIS — I10 Essential (primary) hypertension: Secondary | ICD-10-CM | POA: Diagnosis not present

## 2023-06-05 NOTE — Patient Instructions (Addendum)
Medication Instructions:   No changes *If you need a refill on your cardiac medications before your next appointment, please call your pharmacy*   Lab Work: Not needed   Testing/Procedures:  Not needed  Follow-Up: At [facility], you and your health needs are our priority.  As part of our continuing mission to provide you with exceptional heart care, we have created designated Provider Care Teams.  These Care Teams include your primary Cardiologist (physician) and Advanced Practice Providers (APPs -  Physician Assistants and Nurse Practitioners) who all work together to provide you with the care you need, when you need it.     Your next appointment:   9 month(s)  The format for your next appointment:   In Person  Provider:   Bryan Lemma, MD

## 2023-06-05 NOTE — Progress Notes (Unsigned)
Cardiology Office Note:  .   Date:  06/06/2023  ID:  Amanda [name], DOB [birth year], MRN [id number] PCP: Shelva Majestic, MD  [facility] Providers Cardiologist:  Bryan Lemma, MD     Chief Complaint  Patient presents with   Follow-up    3 months.  Has questions about genetic testing.   Coronary Artery Disease    Minimal disease noted on recent cath.  This correlates with previous negative stress test.    Patient Profile: Amanda [name] Kitchen     Amanda [name] is a  73 y.o. female non-smoker with a PMH notable for minimal, nonobstructive CAD, and HLD with statin myalgias and memory issues, HTN and anxiety who presents here for 47-month follow-up at the request of Shelva Majestic, MD.  Amanda [name] is a former patient of Dr. Glory Rosebush intermittent for several years.  However have not seen her since October 2023. 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    Amanda [name] was last seen for cardiology follow-up by Azalee Course, PA on 02/10/2023 as a hospital follow-up for chest pain evaluation.  She developed chest tightness while out at dinner and EKG was concerning for possible inferior ST elevations.  However close that was canceled.  Nonurgent catheterization revealed minimal disease (reviewed below. => In follow-up she was doing well with no recurrent chest pain or pressure.  No real dyspnea.  Unable to tolerate Imdur and therefore discontinued.  She was also reluctant to take losartan, citing that it might drop her blood pressures.  (In the past she had reduced back to 25 mg Toprol from 50 mg-increase to 4 palpitations).  She endocrine low-dose atorvastatin 5 mg which she also subsequently dropped. => She was also told to increase her rosuvastatin to 40 mg daily, but did not notice memory issues, joint pains muscle aches.  Referred to Dr. Rennis Golden for lipid management.  She was recently seen by Dr. Rennis Golden on  April 25, 2023 for lipid management: Labs in November showed total cholesterol 224, triglycerides 75, HDL 85 and LDL 126. This is an improvement from 163 however remains above a target LDL less than 70. She is currently on rosuvastatin 40 mg daily. However, compliance with the medicine is reduced because she has noted some possible side effects. => He advised a 2 to 3-week statin holiday for reassessment.  Suggested possibly using Metamucil.  He did recommend avoiding red yeast rice.  Plan was rechecking lipid panel in 3 months including lipid profile LPA and ApoB along with genetic testing.  Subjective  Discussed the use of AI scribe software for clinical note transcription with the patient, who gave verbal consent to proceed.  History of Present Illness   The patient, with a history of cardiac issues (reviewed above), presents for ~ 3 month follow-up. She voices confusion regarding recent genetic testing results ordered by Dr. Rennis Golden to assess for familial hyperlipidemia. She reports having undergone a mouth swab for genetic testing approximately three weeks ago but has not received a clear interpretation of the results. She expresses confusion about the risk scores and disease categories listed in the results. (I explained to her that most likely Dr. Rennis Golden had not yet received the results and therefore I was not able to comment on them - which was inided the case with a result note being sent later in the day).  In September, the patient was hospitalized for chest pain evaluation, although no  significant damage was detected on the echocardiogram. The patient recalls having a catheterization procedure, which revealed minimal non-obstructive issues. She mentions a 30% and 10% blockage but does not specify the location of these blockages. She also recalls discussions about possible spasms.  The patient reports no current chest pain, pressure, tightness, or palpitations. She denies experiencing shortness  of breath, either when lying flat or during physical activity. She also denies any symptoms suggestive of a stroke, such as sudden onset of weakness. She reports no blood in her stools or urine, and no nosebleeds.  The patient mentions a history of high cholesterol, with an LDL level of 124 despite being on a statin. She expresses some concern about the side effects of the statin, including memory issues and a feeling of being "foggy." She has stopped taking the statin and is exploring natural alternatives, including green tea.  The patient also reports occasional sharp pains in her legs, which she describes as being more nerve-like than cramp-like. She denies any swelling in her legs or pain in her calves or thighs at rest. She mentions drinking Pedialyte due to issues with dehydration.  The patient is generally active, reporting that she walked two miles without any problems. She expresses a desire to continue staying active and exercising. She is scheduled to follow up with her lipid specialist in April.         Objective   Current pertinent Meds  Medication Sig   aspirin EC 81 MG tablet Take 1 tablet (81 mg total) by mouth daily. Swallow whole.   CALCIUM PO Take 2 tablets by mouth daily.   clonazePAM (KLONOPIN) 0.5 MG tablet TAKE 1 TABLET BY MOUTH EVERYDAY AT BEDTIME   escitalopram (LEXAPRO) 10 MG tablet TAKE 1 TABLET BY MOUTH EVERYDAY AT BEDTIME   MAGNESIUM PO Take 1 tablet by mouth at bedtime.   Melatonin 3 MG CAPS Take 6 mg by mouth at bedtime.   metoprolol succinate (TOPROL-XL) 25 MG 24 hr tablet TAKE 1 TABLET BY MOUTH DAILY.  (Patient taking differently: Take 25 mg by mouth at bedtime.)   nitroGLYCERIN (NITROSTAT) 0.4 MG SL tablet Place 1 tablet (0.4 mg total) under the tongue every 5 (five) minutes x 3 doses as needed for chest pain.   rOPINIRole (REQUIP) 0.5 MG tablet Take 1 tablet (0.5 mg total) by mouth at bedtime.   rosuvastatin (CRESTOR) 40 MG tablet TAKE 1 TABLET BY MOUTH  EVERY DAY => she stopped taking this as of December 2024   Turmeric (QC TUMERIC COMPLEX PO) Take 1 capsule by mouth daily.     Studies Reviewed: Amanda [name] Kitchen   EKG Interpretation Date/Time:  Monday June 05 2023 09:16:57 EST Ventricular Rate:  63 PR Interval:  148 QRS Duration:  96 QT Interval:  410 QTC Calculation: 419 R Axis:   -16  Text Interpretation: Normal sinus rhythm Artifact Borderline Incomplete right bundle branch block Normal ECG When compared with ECG of 10-Feb-2023 13:29, (RBBB and left anterior fascicular block) is no longer Present Criteria for Inferior infarct are no longer Present Confirmed by Bryan Lemma ([id number]) on 06/05/2023 9:44:41 AM    Zio patch monitor April 2023: Heart rate range 43 to 126 bpm with an average rate of 5.  Rare PACs PVCs of 1 brief episode of NSVT 13 beats. ECHO 02/03/2023: I personally reviewed the echo fails to assess EF and pressures. 1. Left ventricular ejection fraction, by estimation, is 60 to 65% . The left ventricle has normal function. The left ventricle  has no regional wall motion abnormalities. Left ventricular diastolic parameters are consistent with Grade I diastolic dysfunction (Impaired relaxation) . 2. Right ventricular systolic function is normal. The right ventricular size is normal. There is normal pulmonary artery systolic pressure. 3. The mitral valve is normal in structure. Trivial mitral valve regurgitation. No evidence of mitral stenosis. 4. The aortic valve is tricuspid. Aortic valve regurgitation is trivial. No aortic stenosis is present. 5. The inferior vena cava is normal in size with greater than 50% respiratory variability, suggesting right atrial pressure of 3 mmHg.  CATH 02/03/2023: m-dLM 10%p dCx 30% -- Non-obstructive CAD. Normal EF.  (I personally reviewed the cardiac cath images and concur with the findings)  Risk Assessment/Calculations:             Physical Exam:   VS:  BP 136/62   Pulse 63   Ht 5\' 5"  (1.651 m)   Wt  152 lb (68.9 kg)   LMP  (LMP Unknown)   SpO2 97%   BMI 25.29 kg/m    Wt Readings from Last 3 Encounters:  06/05/23 152 lb (68.9 kg)  04/25/23 154 lb 6.4 oz (70 kg)  03/02/23 152 lb (68.9 kg)    GEN: Well nourished, well groomed in no acute distress; healthy-appearing NECK: No JVD; No carotid bruits CARDIAC: Normal S1, S2; RRR, no murmurs, rubs, gallops RESPIRATORY:  Clear to auscultation without rales, wheezing or rhonchi ; nonlabored, good air movement. ABDOMEN: Soft, non-tender, non-distended EXTREMITIES:  No edema; No deformity      ASSESSMENT AND PLAN: .    Problem List Items Addressed This Visit       Cardiology Problems   Coronary artery disease, non-occlusive - Primary (Chronic)   Thankfully, no further episodes of chest pain after hospitalization. Minimal non-obstructive disease noted on catheterization. Currently only managed with low dose Toprol XL 25 mg daily along with aspirin. No longer on statin having stopped for prolonged statin holiday after last visit with Dr. Rennis Golden.  -Defer to Dr. Rennis Golden for further management of lipid profile. His note cites concerns of intolerance of statin including potential interaction with red yeast rice. She is due to see him back on April 4. Will defer to Dr. Rennis Golden to answer questions about genetic testing.      Essential hypertension   Blood pressure slightly elevated during today's visit.  Patient reports not regularly monitoring blood pressure at home. As noted, she is very reluctant to take any additional blood pressure medications, did not increase her beta-blocker in the past declined ARB as well as amlodipine. -Encourage regular home blood pressure monitoring. -Continue current dose of Toprol and determine the potential need for low-dose calcium channel blocker based on consideration coronary spasm      Relevant Orders   EKG 12-Lead (Completed)   Hyperlipidemia with target LDL less than 70 (Chronic)    Elevated LDL  cholesterol. Patient reports side effects with Crestor -> including myopathy, but more concerning memory issues and foggy headedness. Currently not on statin therapy. -Defer to Dr. Rennis Golden for further management of lipid profile.      NSTEMI (non-ST elevated myocardial infarction) (HCC) (Chronic)   Noted to have elevated troponins on admission last fall, but no obstructive disease noted on CAD.  Considered the possibility of spasm. Did not tolerate Imdur due to headache, and was resistant to take amlodipine 5 or even 2.5 mg daily. -For now continue Toprol at current dose, but if symptoms recur, would strongly recommend low-dose amlodipine especially  in light of her somewhat elevated pressures.        Other   Palpitations (Chronic)   Pretty well-controlled on Toprol 25 mg daily. -Continue Toprol -Maintain adequate hydration and avoid triggers       Leg Pain Patient reports occasional sharp pain in legs. No clear etiology identified during today's visit. -Advise patient to monitor symptoms and report any worsening or changes.  Follow-up Patient has appointments with Dr. Rennis Golden in April and Dr. Durene Cal in March. -Schedule follow-up visit for September/October, approximately one year from patient's cardiac event. Follow-Up: Return in about 9 months (around 03/04/2024) for Routine follow up with me.  I spent 50 minutes in the care of Amanda [name] today including reviewing labs (3 min), reviewing studies (-8 minutes personally reviewed Cath Films & Echo images from recent hosptialization), face to face time discussing treatment options (23 min), reviewing records from Clinic Notes from APP Dr. Rennis Golden, PCP as well as hospital H&P and discharge summary (7 minutes), 9 minutes dictating, and documenting in the encounter.     Signed, Marykay Lex, MD, MS Bryan Lemma, M.D., M.S. Interventional Cardiologist  [facility]  Pager # [phone] Phone # [phone] [address] [place], [place] [address]

## 2023-06-06 ENCOUNTER — Encounter: Payer: Self-pay | Admitting: Cardiology

## 2023-06-06 DIAGNOSIS — I2510 Atherosclerotic heart disease of native coronary artery without angina pectoris: Secondary | ICD-10-CM | POA: Insufficient documentation

## 2023-06-06 NOTE — Assessment & Plan Note (Signed)
Blood pressure slightly elevated during today's visit.  Patient reports not regularly monitoring blood pressure at home. As noted, she is very reluctant to take any additional blood pressure medications, did not increase her beta-blocker in the past declined ARB as well as amlodipine. -Encourage regular home blood pressure monitoring. -Continue current dose of Toprol and determine the potential need for low-dose calcium channel blocker based on consideration coronary spasm

## 2023-06-06 NOTE — Assessment & Plan Note (Signed)
Thankfully, no further episodes of chest pain after hospitalization. Minimal non-obstructive disease noted on catheterization. Currently only managed with low dose Toprol XL 25 mg daily along with aspirin. No longer on statin having stopped for prolonged statin holiday after last visit with Dr. Rennis Golden.  -Defer to Dr. Rennis Golden for further management of lipid profile. His note cites concerns of intolerance of statin including potential interaction with red yeast rice. She is due to see him back on April 4. Will defer to Dr. Rennis Golden to answer questions about genetic testing.

## 2023-06-06 NOTE — Assessment & Plan Note (Signed)
Noted to have elevated troponins on admission last fall, but no obstructive disease noted on CAD.  Considered the possibility of spasm. Did not tolerate Imdur due to headache, and was resistant to take amlodipine 5 or even 2.5 mg daily. -For now continue Toprol at current dose, but if symptoms recur, would strongly recommend low-dose amlodipine especially in light of her somewhat elevated pressures.

## 2023-06-06 NOTE — Assessment & Plan Note (Signed)
Elevated LDL cholesterol. Patient reports side effects with Crestor -> including myopathy, but more concerning memory issues and foggy headedness. Currently not on statin therapy. -Defer to Dr. Rennis Golden for further management of lipid profile.

## 2023-06-06 NOTE — Assessment & Plan Note (Signed)
Pretty well-controlled on Toprol 25 mg daily. -Continue Toprol -Maintain adequate hydration and avoid triggers

## 2023-06-08 DIAGNOSIS — M1712 Unilateral primary osteoarthritis, left knee: Secondary | ICD-10-CM | POA: Diagnosis not present

## 2023-06-09 ENCOUNTER — Other Ambulatory Visit: Payer: Self-pay | Admitting: Cardiology

## 2023-06-12 ENCOUNTER — Telehealth: Payer: Self-pay | Admitting: Cardiology

## 2023-06-12 NOTE — Telephone Encounter (Signed)
Pt c/o medication issue:  1. Name of Medication:   metoprolol succinate (TOPROL-XL) 25 MG 24 hr tablet    2. How are you currently taking this medication (dosage and times per day)? TAKE 1 TABLET BY MOUTH DAILY. TAKE WITH OR IMMEDIATELY FOLLOWING A MEAL.  3. Are you having a reaction (difficulty breathing--STAT)? No  4. What is your medication issue? Pt states that she has been taking 1 1/2 tablets daily. Pt is needing refill on medication but needs instructions changed to how she is taking it. Please advise

## 2023-06-12 NOTE — Telephone Encounter (Signed)
Called and spoke to patient and medications sent to patient pharmacy.  Made patient aware to take Toprol XL 25 mg as ordered by her provider. Patient made aware and verbalizes understanding.

## 2023-06-14 DIAGNOSIS — M1712 Unilateral primary osteoarthritis, left knee: Secondary | ICD-10-CM | POA: Diagnosis not present

## 2023-06-29 ENCOUNTER — Telehealth: Payer: Self-pay | Admitting: Cardiology

## 2023-06-29 DIAGNOSIS — E785 Hyperlipidemia, unspecified: Secondary | ICD-10-CM

## 2023-06-29 DIAGNOSIS — Z79899 Other long term (current) drug therapy: Secondary | ICD-10-CM

## 2023-06-29 MED ORDER — EZETIMIBE 10 MG PO TABS
10.0000 mg | ORAL_TABLET | Freq: Every day | ORAL | 3 refills | Status: DC
Start: 2023-06-29 — End: 2023-09-19

## 2023-06-29 NOTE — Telephone Encounter (Signed)
Pt c/o medication issue:  1. Name of Medication: Zetia   2. How are you currently taking this medication (dosage and times per day)?   3. Are you having a reaction (difficulty breathing--STAT)?   4. What is your medication issue?  Patient is requesting call back to discuss starting this medication.

## 2023-06-29 NOTE — Telephone Encounter (Signed)
  Patient identification verified by 2 forms. Amanda Rail, RN    Called and spoke to patient  Patient states:   -would like to receive Rx for Zetia  Informed patient:   -Rx sent to preferred pharmacy   -plan to repeat fasting Lipid in 3 month   -keep 4/4 OV for follow up  Patient verbalized understanding, no questions at this time

## 2023-07-11 NOTE — Telephone Encounter (Signed)
 Call to patient. RN asked if patient would like to proceed with prolia injections. Patient states she has concerns she would like to discuss with Dr. Karma Greaser, specifically that she tends to have reactions to medications and would like to discuss this in detail. Patient also stating she is having some tenderness in left breast she would like checked as well. Appointment scheduled for 07/17/23 at 1600. Patient agreeable to date and time of appointment. Patient advised after appointment, if she desires to proceed with prolia, Dr. Karma Greaser will send RN a message and then can submit necessary insurance information for benefits. Patient agreeable.   Encounter closed.

## 2023-07-12 ENCOUNTER — Ambulatory Visit: Admitting: Family

## 2023-07-12 ENCOUNTER — Encounter: Payer: Self-pay | Admitting: Family

## 2023-07-12 VITALS — BP 132/72 | HR 84 | Temp 97.5°F | Ht 65.0 in | Wt 152.4 lb

## 2023-07-12 DIAGNOSIS — M5412 Radiculopathy, cervical region: Secondary | ICD-10-CM | POA: Diagnosis not present

## 2023-07-12 NOTE — Progress Notes (Signed)
 Patient ID: Amanda [name], female    DOB: 31-Aug-1950, 73 y.o.   MRN: [id number]  Chief Complaint  Patient presents with   Neurologic Problem    States she is having some dizziness at times. Electric feeling in top of back neck and head upper chest. Has seen neurology before but will need referral to go back to there office has not been seen there since 2022.    Discussed the use of AI scribe software for clinical note transcription with the patient, who gave verbal consent to proceed.  History of Present Illness   Amanda [name] is a 73 year old female who presents with dizziness and tingling sensations. She experiences sudden onset dizziness and a tingling sensation described as 'molten lava' flowing from the crown of her head, down the back of her neck, and down to her upper chest. This episode occurred approximately one week ago while she was sitting in a reclined position. The sensation lasted for a few seconds and was accompanied by a feeling of her eyes feeling 'bigger'. She has a history of issues with her occipital nerve and experiences intermittent 'zingers' in her upper back. Chronic neck pain is particularly noticeable with movement. She uses a massage cream and heating pad for relief but is not keen on taking medications like ibuprofen or Aleve. A recent massage provided significant but temporary relief. 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 She is proactive in managing her condition through exercise and calcium supplementation.     Assessment & Plan:     Neck Pain and Sensory Changes - Patient reports a single episode of a sensation of "molten lava" descending from her head to back of neck & upper chest, lasting seconds. Chronic neck pain and stiffness also reported. Possible cervical degeneration or nerve compression. Advised to see Ortho provider first as most  likely r/t cervical vs brain concern. -Refer to [facility] for evaluation and possible imaging, pt is already established. -Ok to continue home remedies using heat and analgesic creams prn. -Consider physical therapy for neck pain and stiffness.      Subjective:    Outpatient Medications Prior to Visit  Medication Sig Dispense Refill   APPLE CIDER VINEGAR PO Take 1 tablet by mouth in the morning, at noon, and at bedtime.     bisacodyl (DULCOLAX) 5 MG EC tablet Take 5 mg by mouth daily as needed for moderate constipation.     CALCIUM PO Take 2 tablets by mouth daily.     clonazePAM (KLONOPIN) 0.5 MG tablet TAKE 1 TABLET BY MOUTH EVERYDAY AT BEDTIME 90 tablet 1   CO ENZYME Q-10 PO Take 1 tablet by mouth daily.     Cyanocobalamin (VITAMIN B-12 ER PO) Take 1 tablet by mouth daily.     escitalopram (LEXAPRO) 10 MG tablet TAKE 1 TABLET BY MOUTH EVERYDAY AT BEDTIME 90 tablet 2   ezetimibe (ZETIA) 10 MG tablet Take 1 tablet (10 mg total) by mouth daily. 90 tablet 3   MAGNESIUM PO Take 1 tablet by mouth at bedtime.     Melatonin 3 MG CAPS Take 6 mg by mouth at bedtime.     metoprolol succinate (TOPROL-XL) 25 MG 24 hr tablet TAKE 1 TABLET BY MOUTH EVERY DAY WITH OR IMMEDIATELY FOLLOWING A MEAL 90 tablet 3   nitroGLYCERIN (NITROSTAT) 0.4 MG SL tablet Place 1 tablet (0.4 mg total) under  the tongue every 5 (five) minutes x 3 doses as needed for chest pain. 20 tablet 2   OVER THE COUNTER MEDICATION Take 1 tablet by mouth at bedtime. Renew Life Cleansemore     OVER THE COUNTER MEDICATION Take 1 tablet by mouth daily. Life extension     Polyethyl Glycol-Propyl Glycol (SYSTANE OP) Place 2 drops into both eyes as needed (dryness).     rOPINIRole (REQUIP) 0.5 MG tablet Take 1 tablet (0.5 mg total) by mouth at bedtime. 90 tablet 3   rosuvastatin (CRESTOR) 40 MG tablet TAKE 1 TABLET BY MOUTH EVERY DAY 90 tablet 3   Turmeric (QC TUMERIC COMPLEX PO) Take 1 capsule by mouth daily.     valACYclovir (VALTREX)  500 MG tablet ONE TAB TWICE A DAY AS NEEDED FOR OUTBREAK 180 tablet 1   Vitamin D-Vitamin K (VITAMIN K2-VITAMIN D3 PO) Take 1 capsule by mouth daily.     aspirin EC 81 MG tablet Take 1 tablet (81 mg total) by mouth daily. Swallow whole. (Patient not taking: Reported on 07/12/2023) 30 tablet 3   No facility-administered medications prior to visit.   Past Medical History:  Diagnosis Date   Anemia    Anxiety    Celiac disease    Chronic constipation    Coronary artery spasm (HCC)    COVID 02/12/2021   COVID-19 virus vaccine not available    Erythema migrans (Lyme disease) 12/17/2021   GERD (gastroesophageal reflux disease)    occasionally (per pt takes apple cider vinager)   Heart palpitations    cardiologist-- dr Herbie [place]--- event monitor 08-24-2011 epic ;  nuclear stress test 03-16-2011 (epic) normal w/ no ischemia, ef 65%;  echo 01-18-2013  ef 60-65%, G1DD   Hiatal hernia    History of subdural hematoma    12/ 23/ 2010  s/p  craniotomy w/ hematoma evacuation (pt had a fall w/ concussion)  per pt no residual   History of thyroid cancer followed by dr Pollyann Kennedy---   s/p  left thyroidectomy --- per pt no radiation and no recurrence   Hyperlipidemia    Hypertension    IBS (irritable bowel syndrome)    Immunization, single disease 06/09/2019   1st dose moderma vaccine administered   Insomnia    OA (osteoarthritis)    Osteopenia, T score -2.1 FRAX 9.4%/1.3% stable from prior DEXA 06/2015   Restless leg syndrome    Right thyroid nodule    followed by dr Pollyann Kennedy--- last ultrasound in epic 03-30-2018 stable , to bx   STD (sexually transmitted disease), HSV    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 Normal EF.AB::   REDUCTION MAMMAPLASTY Bilateral    SUBDURAL HEMATOMA EVACUATION VIA CRANIOTOMY  04-30-2009  @[facility]    left frontotemporapartietal    THYROID LOBECTOMY Left 05-04-2001  @[facility]   dr Pollyann Kennedy   TONSILLECTOMY     TRANSTHORACIC ECHOCARDIOGRAM  02/03/2023   EF 60 to 65%.  Normal function.  No RWMA.  GR 1 DD normal RV.  Normal PAP.  Normal valves.  Normal pressures.   VULVECTOMY N/A 03/12/2019   Procedure: WIDE LOCAL EXCISION OF CARCINOMA IS SITU OF VULVA;  Surgeon: Dara Lords, MD;  Location: [facility];  Service: Gynecology;  Laterality: N/A;  request to follow in [facility] block time requests one hour colposcope available in OR with acetic acid.   VULVECTOMY N/A 06/17/2019   Procedure: WIDE LOCAL  EXCISION VULVECTOMY;  Surgeon: Adolphus Birchwood, MD;  Location: [facility];  Service: Gynecology;  Laterality: N/A;   Allergies  Allergen Reactions   Ambien [Zolpidem Tartrate] Other (See Comments)    Causes Neurological problems with very bad dizziness, pains, disorientation   Cortisone Other (See Comments)    REACTION: "mania" post ESI   Eszopiclone And Related Other (See Comments)    Causes Neurological problems with very bad dizziness, pains, disorientation   Levofloxacin Other (See Comments)    Muscle spasms and jerking movements   Zolpidem Other (See Comments)    Causes Neurological problems with very bad dizziness, pains, disorientation   Dicloxacillin Other (See Comments)    Stomach problems, swelling of tongue   Dilantin [Phenytoin Sodium Extended] Hives and Itching   Erythromycin Other (See Comments)    Tears stomach up   Amlodipine     Head jerks at night    Avelox [Moxifloxacin Hcl In Nacl] Other (See Comments)    Pt unsure of reaction.   Eszopiclone Other (See Comments)   Gluten Meal Other (See Comments)   Influenza Virus Vaccine Other (See Comments)   Isosorbide    Loratadine    Moxifloxacin Other (See Comments)   Nefazodone Other (See Comments)   Other Other (See Comments)    Celiac disease   Phenytoin Other (See Comments)   Trazodone Other (See Comments)   Nitrofuran Derivatives Other (See Comments)    unknown   Phenytoin Sodium Extended Rash   Trazodone And Nefazodone Other (See Comments)    dizzy      Objective:    Physical Exam Vitals and nursing note reviewed.  Constitutional:      Appearance: Normal appearance.  Cardiovascular:     Rate and Rhythm: Normal rate and regular rhythm.  Pulmonary:     Effort: Pulmonary effort is normal.     Breath sounds: Normal breath sounds.  Musculoskeletal:     Cervical back: No edema, erythema or rigidity. Pain with movement, spinous process tenderness and muscular tenderness present.  Skin:    General: Skin is warm and dry.  Neurological:     Mental Status: She is alert.  Psychiatric:        Mood and Affect: Mood normal.        Behavior: Behavior normal.    BP 132/72   Pulse 84   Temp (!) 97.5 F (36.4 C) (Temporal)   Ht 5\' 5"  (1.651 m)   Wt 152 lb 6.4 oz (69.1 kg)   LMP  (LMP Unknown)   SpO2 98%   BMI 25.36 kg/m  Wt Readings from Last 3 Encounters:  07/12/23 152 lb 6.4 oz (69.1 kg)  06/05/23 152 lb (68.9 kg)  04/25/23 154 lb 6.4 oz (70 kg)      Dulce Sellar, NP

## 2023-07-17 ENCOUNTER — Ambulatory Visit (INDEPENDENT_AMBULATORY_CARE_PROVIDER_SITE_OTHER): Admitting: Obstetrics and Gynecology

## 2023-07-17 ENCOUNTER — Encounter: Payer: Self-pay | Admitting: Obstetrics and Gynecology

## 2023-07-17 VITALS — BP 104/68 | HR 72

## 2023-07-17 DIAGNOSIS — M810 Age-related osteoporosis without current pathological fracture: Secondary | ICD-10-CM | POA: Diagnosis not present

## 2023-07-17 DIAGNOSIS — N644 Mastodynia: Secondary | ICD-10-CM | POA: Diagnosis not present

## 2023-07-17 NOTE — Progress Notes (Signed)
 73 y.o. y.o. female here for annual exam. No LMP recorded (lmp unknown). Patient has had a hysterectomy.  Does not want to take any medications besides natural methods for her bones at this time and is afraid of side effects. She is taking calcium, vit D and is exercising. She had side effects from fosamax and does not want to take this again. She was also on a cruise recently and her right breast areola was tender. No discharge or skin color changes There is no height or weight on file to calculate BMI.     07/12/2023    2:46 PM 03/02/2023    2:28 PM 01/12/2023   10:53 AM  Depression screen PHQ 2/9  Decreased Interest 0 0 0  Down, Depressed, Hopeless 0 0 0  PHQ - 2 Score 0 0 0  Altered sleeping  0 0  Tired, decreased energy  0 0  Change in appetite  0 0  Feeling bad or failure about yourself   0 0  Trouble concentrating  0 0  Moving slowly or fidgety/restless  0 0  Suicidal thoughts  0 0  PHQ-9 Score  0 0  Difficult doing work/chores  Not difficult at all Not difficult at all    Blood pressure 104/68, pulse 72, SpO2 99%.     Component Value Date/Time   DIAGPAP  02/23/2023 1430    - Negative for intraepithelial lesion or malignancy (NILM)   DIAGPAP  12/22/2020 1248    - Negative for intraepithelial lesion or malignancy (NILM)   ADEQPAP  02/23/2023 1430    Satisfactory for evaluation; transformation zone component PRESENT.   ADEQPAP Satisfactory for evaluation. 12/22/2020 1248    GYN HISTORY:    Component Value Date/Time   DIAGPAP  02/23/2023 1430    - Negative for intraepithelial lesion or malignancy (NILM)   DIAGPAP  12/22/2020 1248    - Negative for intraepithelial lesion or malignancy (NILM)   ADEQPAP  02/23/2023 1430    Satisfactory for evaluation; transformation zone component PRESENT.   ADEQPAP Satisfactory for evaluation. 12/22/2020 1248    OB History  Gravida Para Term Preterm AB Living  2 2 2   2   SAB IAB Ectopic Multiple Live Births          #  Outcome Date GA Lbr Len/2nd Weight Sex Type Anes PTL Lv  2 Term           1 Term             Past Medical History:  Diagnosis Date   Anemia    Anxiety    Celiac disease    Chronic constipation    Coronary artery spasm (HCC)    COVID 02/12/2021   COVID-19 virus vaccine not available    Erythema migrans (Lyme disease) 12/17/2021   GERD (gastroesophageal reflux disease)    occasionally (per pt takes apple cider vinager)   Heart palpitations    cardiologist-- dr Herbie [place]--- event monitor 08-24-2011 epic ;  nuclear stress test 03-16-2011 (epic) normal w/ no ischemia, ef 65%;  echo 01-18-2013  ef 60-65%, G1DD   Hiatal hernia    History of subdural hematoma    12/ 23/ 2010  s/p  craniotomy w/ hematoma evacuation (pt had a fall w/ concussion)  per pt no residual   History of thyroid cancer followed by dr Pollyann Kennedy---   s/p  left thyroidectomy --- per pt no radiation and no recurrence   Hyperlipidemia    Hypertension  IBS (irritable bowel syndrome)    Immunization, single disease 06/09/2019   1st dose moderma vaccine administered   Insomnia    OA (osteoarthritis)    Osteopenia, T score -2.1 FRAX 9.4%/1.3% stable from prior DEXA 06/2015   Restless leg syndrome    Right thyroid nodule    followed by dr Pollyann Kennedy--- last ultrasound in epic 03-30-2018 stable , to bx   STD (sexually transmitted disease), HSV    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 Normal EF.AB::   REDUCTION MAMMAPLASTY Bilateral    SUBDURAL HEMATOMA EVACUATION VIA CRANIOTOMY  04-30-2009  @[facility]    left frontotemporapartietal    THYROID LOBECTOMY Left 05-04-2001  @[facility]   dr Pollyann Kennedy   TONSILLECTOMY     TRANSTHORACIC ECHOCARDIOGRAM  02/03/2023   EF 60 to 65%.  Normal function.  No RWMA.  GR 1 DD normal RV.  Normal PAP.  Normal valves.  Normal pressures.   VULVECTOMY N/A 03/12/2019   Procedure: WIDE LOCAL EXCISION OF CARCINOMA IS SITU OF VULVA;  Surgeon: Dara Lords, MD;  Location: [facility];  Service: Gynecology;  Laterality: N/A;  request to follow in [facility] block time requests one hour colposcope available in OR with acetic acid.   VULVECTOMY N/A 06/17/2019   Procedure: WIDE LOCAL  EXCISION VULVECTOMY;  Surgeon: Adolphus Birchwood, MD;  Location: [facility];  Service: Gynecology;  Laterality: N/A;    Current Outpatient Medications on File Prior to Visit  Medication Sig Dispense Refill   APPLE CIDER VINEGAR PO Take 1 tablet by mouth in the morning, at noon, and at bedtime.     bisacodyl (DULCOLAX) 5 MG EC tablet Take 5 mg by mouth daily as needed for moderate constipation.     CALCIUM PO Take 2 tablets by mouth daily.     clonazePAM (KLONOPIN) 0.5 MG tablet TAKE 1 TABLET BY MOUTH EVERYDAY AT BEDTIME 90 tablet 1   CO ENZYME Q-10 PO Take 1 tablet by mouth daily.     Cyanocobalamin (VITAMIN B-12 ER PO) Take 1 tablet by mouth daily.     escitalopram (LEXAPRO) 10 MG tablet TAKE 1 TABLET BY MOUTH EVERYDAY AT BEDTIME 90 tablet 2   ezetimibe (ZETIA) 10 MG tablet Take 1 tablet (10 mg total) by mouth daily. 90 tablet 3   MAGNESIUM PO Take 1 tablet by mouth at bedtime.     Melatonin 3 MG CAPS Take 6 mg by mouth at bedtime.     metoprolol succinate (TOPROL-XL) 25 MG 24 hr tablet TAKE 1 TABLET BY MOUTH EVERY DAY WITH OR IMMEDIATELY FOLLOWING A MEAL 90 tablet 3   OVER THE COUNTER MEDICATION Take 1 tablet by mouth at bedtime.  Renew Life Cleansemore     OVER THE COUNTER MEDICATION Take 1 tablet by mouth daily. Life extension     Polyethyl Glycol-Propyl Glycol (SYSTANE OP) Place 2 drops into both eyes as needed (dryness).  rOPINIRole (REQUIP) 0.5 MG tablet Take 1 tablet (0.5 mg total) by mouth at bedtime. 90 tablet 3   Turmeric (QC TUMERIC COMPLEX PO) Take 1 capsule by mouth daily.     valACYclovir (VALTREX) 500 MG tablet ONE TAB TWICE A DAY AS NEEDED FOR OUTBREAK 180 tablet 1   Vitamin D-Vitamin K (VITAMIN K2-VITAMIN D3 PO) Take 1 capsule by mouth daily.     aspirin EC 81 MG tablet Take 1 tablet (81 mg total) by mouth daily. Swallow whole. (Patient not taking: Reported on 07/17/2023) 30 tablet 3   nitroGLYCERIN (NITROSTAT) 0.4 MG SL tablet Place 1 tablet (0.4 mg total) under the tongue every 5 (five) minutes x 3 doses as needed for chest pain. (Patient not taking: Reported on 07/17/2023) 20 tablet 2   No current facility-administered medications on file prior to visit.    Social History   Socioeconomic History   Marital status: Married    Spouse name: Not on file   Number of children: 2   Years of education: Not on file   Highest education level: Not on file  Occupational History   Occupation: retired  Tobacco Use   Smoking status: Never   Smokeless tobacco: Never  Vaping Use   Vaping status: Never Used  Substance and Sexual Activity   Alcohol use: Yes    Comment: once a week   Drug use: Never   Sexual activity: Not Currently    Partners: Male    Birth control/protection: Surgical    Comment: HYST-1st intercourse 73 yo-Fewer than 5 partners  Other Topics Concern   Not on file  Social History Narrative   Married mother of 76 with 41 year old granddaughter (born in 2019) and another due in 2022. Her son Alycia Rossetti is patient of Dr. Durene Cal       Lives with husband in a three story home, no issue with stairs.    Highest level of education is an associates.       She exercises regularly, leads an active  lifestyle, has a healthy diet, and is very socially engaged. Self-described "HSP: highly sensitive person" and therefore tends to worry more about symptoms than others might.      Hobbies: enjoys time with friends, time on front porch with friends during covid, park daily, family time   Social Drivers of Health   Financial Resource Strain: Low Risk  (02/01/2022)   Overall Financial Resource Strain (CARDIA)    Difficulty of Paying Living Expenses: Not hard at all  Food Insecurity: No Food Insecurity (02/03/2023)   Hunger Vital Sign    Worried About Running Out of Food in the Last Year: Never true    Ran Out of Food in the Last Year: Never true  Transportation Needs: No Transportation Needs (02/03/2023)   PRAPARE - Administrator, Civil Service (Medical): No    Lack of Transportation (Non-Medical): No  Physical Activity: Sufficiently Active (02/01/2022)   Exercise Vital Sign    Days of Exercise per Week: 7 days    Minutes of Exercise per Session: 60 min  Stress: No Stress Concern Present (02/01/2022)   [employer] of Occupational Health - Occupational Stress Questionnaire    Feeling of Stress : Not at all  Social Connections: Not on file  Intimate Partner Violence: Not on file    Family History  Problem Relation Age of Onset   Failure to thrive Mother        85   Liver cancer  Father        alcohol related likely   Hypertension Father    Heart disease Maternal Grandmother    Heart disease Maternal Grandfather    Hyperlipidemia Maternal Grandfather    Hypertension Paternal Grandfather    Diabetes Maternal Aunt    Breast cancer Maternal Aunt 60   Colon cancer Neg Hx    Thyroid disease Neg Hx      Allergies  Allergen Reactions   Ambien [Zolpidem Tartrate] Other (See Comments)    Causes Neurological problems with very bad dizziness, pains, disorientation   Cortisone Other (See Comments)    REACTION: "mania" post ESI   Eszopiclone And Related Other (See  Comments)    Causes Neurological problems with very bad dizziness, pains, disorientation   Levofloxacin Other (See Comments)    Muscle spasms and jerking movements   Zolpidem Other (See Comments)    Causes Neurological problems with very bad dizziness, pains, disorientation   Dicloxacillin Other (See Comments)    Stomach problems, swelling of tongue   Dilantin [Phenytoin Sodium Extended] Hives and Itching   Erythromycin Other (See Comments)    Tears stomach up   Amlodipine     Head jerks at night   Avelox [Moxifloxacin Hcl In Nacl] Other (See Comments)    Pt unsure of reaction.   Eszopiclone Other (See Comments)   Gluten Meal Other (See Comments)   Influenza Virus Vaccine Other (See Comments)   Isosorbide    Loratadine    Moxifloxacin Other (See Comments)   Nefazodone Other (See Comments)   Other Other (See Comments)    Celiac disease   Phenytoin Other (See Comments)   Trazodone Other (See Comments)   Nitrofuran Derivatives Other (See Comments)    unknown   Phenytoin Sodium Extended Rash   Trazodone And Nefazodone Other (See Comments)    dizzy      Patient's last menstrual period was No LMP recorded (lmp unknown). Patient has had a hysterectomy..             Review of Systems Alls systems reviewed and are negative.     Breast normal, no skin changes or palpable masses, no discharge    A:         right breast pain now resolved, osteopenia with positive frax                             P:       To run MMG with ultrasound Repeat dxa scan in two years.  Declines any medication.  Continue with vit D and calcium and exercise.  Reviewed all medication options. 3. RTC for annual exams or sooner with any concerns  No follow-ups on file.  Earley Favor

## 2023-07-20 DIAGNOSIS — M542 Cervicalgia: Secondary | ICD-10-CM | POA: Diagnosis not present

## 2023-07-20 DIAGNOSIS — M545 Low back pain, unspecified: Secondary | ICD-10-CM | POA: Diagnosis not present

## 2023-07-20 DIAGNOSIS — M51369 Other intervertebral disc degeneration, lumbar region without mention of lumbar back pain or lower extremity pain: Secondary | ICD-10-CM | POA: Diagnosis not present

## 2023-07-20 DIAGNOSIS — M25511 Pain in right shoulder: Secondary | ICD-10-CM | POA: Diagnosis not present

## 2023-07-20 DIAGNOSIS — M25512 Pain in left shoulder: Secondary | ICD-10-CM | POA: Diagnosis not present

## 2023-07-25 ENCOUNTER — Encounter: Payer: Self-pay | Admitting: Family Medicine

## 2023-07-25 ENCOUNTER — Ambulatory Visit: Payer: Medicare Other | Admitting: Family Medicine

## 2023-07-25 VITALS — BP 118/70 | HR 72 | Temp 97.8°F | Ht 65.0 in | Wt 148.4 lb

## 2023-07-25 DIAGNOSIS — I10 Essential (primary) hypertension: Secondary | ICD-10-CM | POA: Diagnosis not present

## 2023-07-25 DIAGNOSIS — E785 Hyperlipidemia, unspecified: Secondary | ICD-10-CM | POA: Diagnosis not present

## 2023-07-25 DIAGNOSIS — G4700 Insomnia, unspecified: Secondary | ICD-10-CM

## 2023-07-25 DIAGNOSIS — R790 Abnormal level of blood mineral: Secondary | ICD-10-CM | POA: Diagnosis not present

## 2023-07-25 DIAGNOSIS — E88819 Insulin resistance, unspecified: Secondary | ICD-10-CM

## 2023-07-25 DIAGNOSIS — G2581 Restless legs syndrome: Secondary | ICD-10-CM | POA: Diagnosis not present

## 2023-07-25 DIAGNOSIS — Z85850 Personal history of malignant neoplasm of thyroid: Secondary | ICD-10-CM

## 2023-07-25 DIAGNOSIS — Z131 Encounter for screening for diabetes mellitus: Secondary | ICD-10-CM | POA: Diagnosis not present

## 2023-07-25 LAB — IBC + FERRITIN
Ferritin: 30.8 ng/mL (ref 10.0–291.0)
Iron: 68 ug/dL (ref 42–145)
Saturation Ratios: 19 % — ABNORMAL LOW (ref 20.0–50.0)
TIBC: 357 ug/dL (ref 250.0–450.0)
Transferrin: 255 mg/dL (ref 212.0–360.0)

## 2023-07-25 LAB — CBC WITH DIFFERENTIAL/PLATELET
Basophils Absolute: 0 10*3/uL (ref 0.0–0.1)
Basophils Relative: 0.5 % (ref 0.0–3.0)
Eosinophils Absolute: 0.2 10*3/uL (ref 0.0–0.7)
Eosinophils Relative: 3.4 % (ref 0.0–5.0)
HCT: 38.7 % (ref 36.0–46.0)
Hemoglobin: 12.9 g/dL (ref 12.0–15.0)
Lymphocytes Relative: 22.4 % (ref 12.0–46.0)
Lymphs Abs: 1.1 10*3/uL (ref 0.7–4.0)
MCHC: 33.3 g/dL (ref 30.0–36.0)
MCV: 92.1 fl (ref 78.0–100.0)
Monocytes Absolute: 0.3 10*3/uL (ref 0.1–1.0)
Monocytes Relative: 5.4 % (ref 3.0–12.0)
Neutro Abs: 3.3 10*3/uL (ref 1.4–7.7)
Neutrophils Relative %: 68.3 % (ref 43.0–77.0)
Platelets: 245 10*3/uL (ref 150.0–400.0)
RBC: 4.2 Mil/uL (ref 3.87–5.11)
RDW: 13 % (ref 11.5–15.5)
WBC: 4.9 10*3/uL (ref 4.0–10.5)

## 2023-07-25 LAB — COMPREHENSIVE METABOLIC PANEL
ALT: 16 U/L (ref 0–35)
AST: 21 U/L (ref 0–37)
Albumin: 4.7 g/dL (ref 3.5–5.2)
Alkaline Phosphatase: 60 U/L (ref 39–117)
BUN: 17 mg/dL (ref 6–23)
CO2: 28 meq/L (ref 19–32)
Calcium: 9.7 mg/dL (ref 8.4–10.5)
Chloride: 102 meq/L (ref 96–112)
Creatinine, Ser: 0.98 mg/dL (ref 0.40–1.20)
GFR: 57.36 mL/min — ABNORMAL LOW (ref >60.00)
Glucose, Bld: 97 mg/dL (ref 70–99)
Potassium: 4.2 meq/L (ref 3.5–5.1)
Sodium: 139 meq/L (ref 135–145)
Total Bilirubin: 0.5 mg/dL (ref 0.2–1.2)
Total Protein: 7.5 g/dL (ref 6.0–8.3)

## 2023-07-25 LAB — HEMOGLOBIN A1C: Hgb A1c MFr Bld: 5.8 % (ref 4.6–6.5)

## 2023-07-25 LAB — TSH: TSH: 0.96 u[IU]/mL (ref 0.35–5.50)

## 2023-07-25 MED ORDER — CLONAZEPAM 0.5 MG PO TABS: 0.5000 mg | ORAL_TABLET | Freq: Every evening | ORAL | 1 refills | Status: DC | PRN

## 2023-07-25 NOTE — Patient Instructions (Addendum)
 [facility] contact- due in July- you can call them now if desired Please call to schedule visit and/or procedure Address: [address] [place], [place], [place] [address] Phone: [phone]   Double check with Dr. Herbie Baltimore- but it appears they did want you on aspirin 81 mg- I am leaving on your list for now  Please stop by lab before you go If you have mychart- we will send your results within 3 business days of [place] receiving them.  If you do not have mychart- we will call you about results within 5 business days of [place] receiving them.  *please also note that you will see labs on mychart as soon as they post. I will later go in and write notes on them- will say "notes from Dr. Durene Cal"    Recommended follow up: Return in about 1 year (around 07/24/2024) for followup or sooner if needed.Schedule b4 you leave.

## 2023-07-25 NOTE — Progress Notes (Signed)
 Phone [phone] In person visit   Subjective:   Amanda [name] is a 73 y.o. year old very pleasant female patient who presents for/with See problem oriented charting Chief Complaint  Patient presents with   Annual Exam    Fasting. 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    Priority: High   History of Guillain-Barre syndrome 04/01/2018    Priority: High   Thyroid nodule 03/27/2018    Priority: High   Celiac disease 11/27/2007    Priority: High   Insomnia 07/05/2007    Priority: High   History of thyroid cancer 10/03/2006    Priority: High   Essential hypertension 02/28/2022    Priority: Medium    Dysphagia 12/23/2019    Priority: Medium    History of subdural hematoma 03/26/2019    Priority: Medium    Herpes simplex type 1 infection 11/01/2018    Priority: Medium    Hyperlipidemia with target LDL less than 70 03/30/2018    Priority: Medium    Fuchs' corneal dystrophy, 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    Priority: Medium    Insulin resistance 03/29/2018    Priority: Medium    Grade I diastolic dysfunction 03/29/2018    Priority: Medium    Dysthymia 09/26/2016    Priority: Medium    Palpitations 01/17/2013    Priority: Medium    GERD (gastroesophageal reflux disease)     Priority: Medium    Restless leg syndrome, on Requip     Priority: Medium    IBS (irritable bowel syndrome) 02/16/2009    Priority: Medium    Osteoarthritis 07/05/2007    Priority: Medium    Osteopenia 10/03/2006    Priority: Medium    Elevated blood pressure reading 03/06/2020    Priority: Low   Mastalgia, left, followed by GYN 03/29/2018    Priority: Low   History of hysterectomy 03/29/2018    Priority: Low   Presbycusis of both ears 03/10/2017    Priority: Low   Allergic rhinitis 06/25/2009    Priority: Low   Diaphragmatic hernia  02/16/2009    Priority: Low   Left carotid bruit 10/03/2006    Priority: Low   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   Pain in left knee 09/24/2019    Priority: 1.   Coronary artery disease, non-occlusive 06/06/2023   Overweight 03/02/2023   Polyarthralgia 03/02/2023   ACS (acute coronary syndrome) (HCC) 02/02/2023   NSTEMI (non-ST elevated myocardial infarction) (HCC) 02/02/2023   Degeneration of lumbar intervertebral disc 09/07/2022   Deviated nasal septum 07/29/2022   Pain in right hand 04/26/2022   Hoarseness 12/02/2021   Chronic constipation 05/18/2021    Medications- reviewed and updated Current Outpatient Medications  Medication Sig Dispense Refill   APPLE CIDER VINEGAR PO Take 1 tablet by mouth in the morning, at noon, and at bedtime.     aspirin EC 81 MG tablet Take 1 tablet (81 mg total) by mouth daily. Swallow whole. 30 tablet 3   bisacodyl (DULCOLAX) 5 MG EC tablet Take 5 mg by mouth daily as needed for moderate constipation.     CALCIUM PO Take 2 tablets by mouth daily.     CO ENZYME Q-10 PO Take 1 tablet by mouth daily.     Cyanocobalamin (VITAMIN B-12 ER PO) Take 1 tablet by mouth  daily.     escitalopram (LEXAPRO) 10 MG tablet TAKE 1 TABLET BY MOUTH EVERYDAY AT BEDTIME 90 tablet 2   ezetimibe (ZETIA) 10 MG tablet Take 1 tablet (10 mg total) by mouth daily. 90 tablet 3   MAGNESIUM PO Take 1 tablet by mouth at bedtime.     Melatonin 3 MG CAPS Take 6 mg by mouth at bedtime.     metoprolol succinate (TOPROL-XL) 25 MG 24 hr tablet TAKE 1 TABLET BY MOUTH EVERY DAY WITH OR IMMEDIATELY FOLLOWING A MEAL 90 tablet 3   nitroGLYCERIN (NITROSTAT) 0.4 MG SL tablet Place 1 tablet (0.4 mg total) under the tongue every 5 (five) minutes x 3 doses as needed for chest pain. 20 tablet 2   OVER THE COUNTER MEDICATION Take 1 tablet by mouth at bedtime. Renew Life [place]     OVER THE COUNTER MEDICATION Take 1 tablet by mouth daily. Life extension     Polyethyl  Glycol-Propyl Glycol (SYSTANE OP) Place 2 drops into both eyes as needed (dryness).     rOPINIRole (REQUIP) 0.5 MG tablet Take 1 tablet (0.5 mg total) by mouth at bedtime. 90 tablet 3   Turmeric (QC TUMERIC COMPLEX PO) Take 1 capsule by mouth daily.     valACYclovir (VALTREX) 500 MG tablet ONE TAB TWICE A DAY AS NEEDED FOR OUTBREAK 180 tablet 1   Vitamin D-Vitamin K (VITAMIN K2-VITAMIN D3 PO) Take 1 capsule by mouth daily.     clonazePAM (KLONOPIN) 0.5 MG tablet Take 1 tablet (0.5 mg total) by mouth at bedtime as needed for anxiety (sleep). 90 tablet 1   No current facility-administered medications for this visit.     Objective:  BP 118/70   Pulse 72   Temp 97.8 F (36.6 C)   Ht 5\' 5"  (1.651 m)   Wt 148 lb 6.4 oz (67.3 kg)   LMP  (LMP Unknown)   SpO2 97%   BMI 24.70 kg/m  Gen: NAD, resting comfortably Tympanic membrane normal, oropharynx normal CV: RRR no murmurs rubs or gallops Lungs: CTAB no crackles, wheeze, rhonchi Abdomen: soft/nontender/nondistended/normal bowel sounds. No rebound or guarding.  Ext: no edema Skin: warm, dry Neuro: grossly normal, moves all extremities     Assessment and Plan   # [facility] visit for knee pain after stepping in a hole-thought to be stress reaction.  Treated with conservative care- slowly improving- meloxicam as needed- but trying to avoid- aware of risks such as heart, gastroenterology bleeding risk, kidney risk - Eventually needed injection into the left knee for arthritis-hyaluronic acid injections x 3 -Related with cervical radiculopathy treated by Acie Fredrickson at our office but ended up requiring PT through our office- she is getting scheduled now - also some back pain plans to work  #hypertension with white coat element-new diagnosis 02/28/2022 S: medication: Metoprolol 50 mg--> 25mg  extended release trial 02/28/2022 (on at baseline for palpitations) -walking regularly still 2 miles a day, lifting weights some, planning  on physical therapy BP Readings from Last 3 Encounters:  07/25/23 118/70  07/17/23 104/68  07/12/23 132/72  A/P: well controlled continue current medications    # Nonobstructive CAD-minimal nonobstructive disease per Dr. Herbie Baltimore.  Had elevated troponin and concern was for NSTEMI initially. #hyperlipidemia-peak LDL of 187 S: Medication:  aspirin 81 mg listed (not currently taking- she prefrs to discuss with Dr. Herbie Baltimore), Zetia 10 mg  -Memory issues and myalgias on Crestor-referred to lipid clinic- working with Dr. Rennis Golden- looks like too soon for lipid repeat  -  Imdur causes headaches and has opted out of amlodipine  Lab Results  Component Value Date   CHOL 224 (H) 03/22/2023   HDL 85 03/22/2023   LDLCALC 126 (H) 03/22/2023   LDLDIRECT 139.2 03/10/2008   TRIG 75 03/22/2023   CHOLHDL 2.6 03/22/2023  A/P: nonobstructive coronary artery disease- no recent chest pain or shortness of breath - continue zetia and lipids to be rechecked by cardiology in coming months - off aspirin right now but is going to run by cardiology- appears she should be on  #Restless legs S: Medication: Requip 0.5 mg daily with no issues on this - on iron to keep ferritin over 75- but watch constipating- not on iron at moment  Lab Results  Component Value Date   FERRITIN 28 02/14/2020  A/P: restless legs doing well lately- does want to check iron stores and may want to push for levels over 75 on ferritin if recurrence of symptoms    #Insomnia/anxiety S: Medication:escitalopram 10 mg , Clonazepam 0.5 mg- wakes up after 5 hours and takes magnesium and melatonin but unable to fall back asleep for 3 hours- had worked before  -On Ambien in the past and had confusion - On Lunesta had dizziness/disorientation  - On trazodone had dizziness   A/P: reports good control of anxiety but still poor sleep.   -discussed correlations with memory loss and clonazepam- she may look at natural options and try to come off with other  attempts and failures   # Hyperglycemia/insulin resistance/prediabetes-peak A1c 5.8 S:  Medication: none Exercise and diet- -Walking 2 miles each day, reasonably healthy diet- and healthy weight  Lab Results  Component Value Date   HGBA1C 5.7 (H) 02/02/2023   HGBA1C 5.7 08/02/2022   HGBA1C 5.8 02/28/2022   A/P: hopefully stable- update a1c today. Continue without meds for now    # Low Bone density (formerly osteopenia)-follows with Dr. Karma Greaser of GYN S: Last DEXA: Worst T-score -2.13 May 2023 with elevated osteoporotic fracture and hip fracture risk above 20 and 3%  -Not the best candidate for Fosamax with reflux history  -With possible coronary spasms has not been prescribed Evenity  -Prolia has been considered- she wants to hold off   Calcium: 1200mg  (through diet ok) recommended -yes Vitamin D: 1000 units a day recommended-yes A/P: low bone density but wants to hold on medications and continue calcium/vitamin D and weight bearing exercise.    #Esophageal obstruction/stricture S: Has required esophageal dilation with Dr. Nadara Mustard at [facility]. -Takes pantoprazole 40 mg and remains on B12  -apple cider vinegar capsules helpful A/P: thankfully no recurrence of dysphagia- continue to monitor - glad she's doing well   #History of thyroid nodule-she has seen endocrinology in the past with reassuring biopsy 04/29/2020 with Dr. Everardo All at that time  -Also with history of thyroid cancer papillary with left lobe removed in 2007 -Check TSH annually   #History of vertigo-has seen Dr. Everlena Cooper in the past in 2022.  Has had an MRI of the brain.  Dr. Everlena Cooper thought most likely cervical in origin.  Physical therapy was helpful and has continued home exercises- just as needed    #History of vulvar excision in February 2021-previously followed by Dr. Adolphus Birchwood. From pathology "06/2019 A. VULVA, LEFT POSTERIOR, EXCISION:  - High-grade squamous intraepithelial lesion (VIN2-3, high grade   dysplasia)"- has been doing well lately- no concerns   #Health maintenance- -Opts out of COVID-19-apparently developed Sjogren's shortly after prior vaccination  -Most recent mammogram on  02/01/23 -Declines pneumonia vaccination other than Pneumovax 23 in 2019- declines Prevnar 20 -Declines Shingrix  -Declines flu shot  -Declines RSV - colonoscopy 11/22/2018  with 5 year repeat due later this year  Recommended follow up: No follow-ups on file. Future Appointments  Date Time Provider Department Center  08/11/2023 10:15 AM Hilty, Lisette Abu, MD [facility]   Lab/Order associations: fasting   ICD-10-CM   1. Essential hypertension  I10 Comprehensive metabolic panel    CBC with Differential/Platelet    2. Hyperlipidemia with target LDL less than 70  E78.5 Comprehensive metabolic panel    CBC with Differential/Platelet    3. Insulin resistance  E88.819 Hemoglobin A1c    4. Screening for diabetes mellitus  Z13.1 Hemoglobin A1c    5. Insomnia, unspecified type  G47.00     6. History of thyroid cancer  Z85.850 TSH    7. Restless leg syndrome, on Requip  G25.81 IBC + Ferritin    8. Low ferritin  R79.0 IBC + Ferritin      Meds ordered this encounter  Medications   clonazePAM (KLONOPIN) 0.5 MG tablet    Sig: Take 1 tablet (0.5 mg total) by mouth at bedtime as needed for anxiety (sleep).    Dispense:  90 tablet    Refill:  1    Not to exceed 4 additional fills before 04/03/2023    Return precautions advised.  Tana Conch, MD

## 2023-07-26 ENCOUNTER — Ambulatory Visit (INDEPENDENT_AMBULATORY_CARE_PROVIDER_SITE_OTHER): Admitting: Physical Therapy

## 2023-07-26 ENCOUNTER — Encounter: Payer: Self-pay | Admitting: Physical Therapy

## 2023-07-26 DIAGNOSIS — M542 Cervicalgia: Secondary | ICD-10-CM | POA: Diagnosis not present

## 2023-07-26 DIAGNOSIS — M6281 Muscle weakness (generalized): Secondary | ICD-10-CM | POA: Diagnosis not present

## 2023-07-26 NOTE — Therapy (Unsigned)
 OUTPATIENT PHYSICAL THERAPY CERVICAL EVALUATION   Patient Name: Amanda [name] MRN: [id number] DOB:February 04, 1951, 73 y.o., female Today's Date: 07/27/2023  END OF SESSION:  PT End of Session - 07/26/23 1431     Visit Number 1    Number of Visits 12    Date for PT Re-Evaluation 10/18/23    Authorization Type medicare - 20% coinsurance    Authorization Time Period `    Progress Note Due on Visit 10    PT Start Time 1432    PT Stop Time 1510    PT Time Calculation (min) 38 min    Activity Tolerance Patient tolerated treatment well    Behavior During Therapy [facility] for tasks assessed/performed             Past Medical History:  Diagnosis Date   Anemia    Anxiety    Celiac disease    Chronic constipation    Coronary artery spasm (HCC)    COVID 02/12/2021   COVID-19 virus vaccine not available    Erythema migrans (Lyme disease) 12/17/2021   GERD (gastroesophageal reflux disease)    occasionally (per pt takes apple cider vinager)   Heart palpitations    cardiologist-- dr Herbie Baltimore--- event monitor 08-24-2011 epic ;  nuclear stress test 03-16-2011 (epic) normal w/ no ischemia, ef 65%;  echo 01-18-2013  ef 60-65%, G1DD   Hiatal hernia    History of subdural hematoma    12/ 23/ 2010  s/p  craniotomy w/ hematoma evacuation (pt had a fall w/ concussion)  per pt no residual   History of thyroid cancer followed by dr Pollyann Kennedy---   s/p  left thyroidectomy --- per pt no radiation and no recurrence   Hyperlipidemia    Hypertension    IBS (irritable bowel syndrome)    Immunization, single disease 06/09/2019   1st dose moderma vaccine administered   Insomnia    OA (osteoarthritis)    Osteopenia, T score -2.1 FRAX 9.4%/1.3% stable from prior DEXA 06/2015   Restless leg syndrome    Right thyroid nodule    followed by dr Pollyann Kennedy--- last ultrasound in epic 03-30-2018 stable , to bx   STD (sexually transmitted disease), HSV    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 Normal EF.AB::   REDUCTION MAMMAPLASTY Bilateral    SUBDURAL HEMATOMA EVACUATION VIA CRANIOTOMY  04-30-2009  @[facility]    left frontotemporapartietal    THYROID LOBECTOMY Left 05-04-2001  @[facility]   dr Pollyann Kennedy   TONSILLECTOMY     TRANSTHORACIC ECHOCARDIOGRAM  02/03/2023   EF 60 to 65%.  Normal function.  No RWMA.  GR 1 DD normal RV.  Normal PAP.  Normal valves.  Normal pressures.   VULVECTOMY N/A 03/12/2019   Procedure: WIDE LOCAL EXCISION OF CARCINOMA IS SITU OF VULVA;  Surgeon: Dara Lords, MD;  Location: [facility];  Service: Gynecology;  Laterality: N/A;  request to follow in [facility]  Gyn block time requests one hour colposcope available in OR with acetic acid.   VULVECTOMY N/A 06/17/2019   Procedure: WIDE LOCAL  EXCISION VULVECTOMY;  Surgeon: Adolphus Birchwood, MD;  Location: [facility];  Service: Gynecology;  Laterality: N/A;   Patient Active Problem List   Diagnosis Date Noted   Coronary artery disease, non-occlusive 06/06/2023   Overweight 03/02/2023   Polyarthralgia 03/02/2023   ACS (acute coronary syndrome) (HCC) 02/02/2023   NSTEMI (non-ST elevated myocardial infarction) (HCC) 02/02/2023   Degeneration of lumbar intervertebral disc 09/07/2022   Deviated nasal septum 07/29/2022   Pain in right hand 04/26/2022   Essential hypertension 02/28/2022   Hoarseness 12/02/2021   Chronic constipation 05/18/2021   Elevated blood  pressure reading 03/06/2020   Dysphagia 12/23/2019   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   Pain in left knee 09/24/2019   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   History of subdural hematoma 03/26/2019   Herpes simplex type 1 infection 11/01/2018   History of Guillain-Barre syndrome 04/01/2018   Hyperlipidemia with target LDL less than 70 03/30/2018   Mastalgia, left, followed by GYN 03/29/2018   Fuchs' corneal dystrophy, 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   Insulin resistance 03/29/2018   History of hysterectomy 03/29/2018   Grade I diastolic dysfunction 03/29/2018   Thyroid nodule 03/27/2018   Presbycusis of both ears 03/10/2017   Dysthymia 09/26/2016   Palpitations 01/17/2013   GERD (gastroesophageal reflux disease)    Restless leg syndrome, on Requip    Allergic rhinitis 06/25/2009   Diaphragmatic hernia 02/16/2009   IBS (irritable bowel syndrome) 02/16/2009   Celiac disease 11/27/2007   Insomnia 07/05/2007   Osteoarthritis 07/05/2007   Osteopenia 10/03/2006   Left carotid bruit 10/03/2006   History of thyroid cancer 10/03/2006    PCP: Tana Conch, MD  REFERRING PROVIDER: Pincus Badder, FNP  REFERRING DIAG: M54.2 (ICD-10-CM) - Cervicalgia  THERAPY DIAG:  Cervicalgia  Muscle weakness (generalized)  Rationale for Evaluation and Treatment: Rehabilitation  ONSET DATE: a while ago  SUBJECTIVE:                                                                                                                                                                                                         SUBJECTIVE STATEMENT: Patient has multiple joint complaints but primary complaint at this time is her neck.  Reports she does have occasional dizziness that she does not know if it is related to her neck.  Reports she also has this tingling sensation along her upper back and right pinky  and ring finger.  No current tingling sensations noted.  Reports  overall she is very active and works out and just needs to wear what to do for her symptoms reports that if she does anything for long period of time her back and neck will hurt.  Reports she has done some stretches but they have not significantly helped.  Reports she also has left knee pain and gets shots to manage her symptoms.     PERTINENT HISTORY:   TMJ pain, CAD,  NSTEMI, hx of thyroid cancer with thyroidectomy, subdural hematoma with craniotomy 2010, OA, osteopenia  PAIN:  Are you having pain? Yes: NPRS scale: 2/10 Pain location: middle of lower neck B Pain description: tightness Aggravating factors: looking up  Relieving factors: massage, stretch, heat   PRECAUTIONS: None  RED FLAGS: None     WEIGHT BEARING RESTRICTIONS: No  FALLS:  Has patient fallen in last 6 months? No    OCCUPATION: not current working  PLOF: Independent  PATIENT GOALS: to have less pain and knowing what to do for her neck     OBJECTIVE:  Note: Objective measures were completed at Evaluation unless otherwise noted.  DIAGNOSTIC FINDINGS:  No recent imaging  PATIENT SURVEYS:  Patient-specific activity functional scoring scheme (Point to one number):  "0" represents "unable to perform." "10" represents "able to perform at prior level. 0 1 2 3 4 5 6 7 8 9  10 (Date and Score) Activity Initial  Activity Eval     Looking up   2    Sleep- getting comfortable   6    vacuuming 0    Additional Additional Total score = sum of the activity scores/number of activities Minimum detectable change (90%CI) for average score = 2 points Minimum detectable change (90%CI) for single activity score = 3 points PSFS developed by: Jake Seats., & Binkley, J. (1995). Assessing disability and change on individual  patients: a report of a patient specific measure. Physiotherapy [place], 47, [phone]. Reproduced with the permission of the authors  Score: 8/3=  2.66   COGNITION: Overall cognitive status: Within functional limits for tasks assessed  SENSATION: Not tested  POSTURE: forward head and posterior pelvic tilt  PALPATION: Tenderness to palpation along B cervical paraspinals and UT   CERVICAL ROM:   Active ROM A/PROM (deg) eval  Flexion 30*  Extension 20*  Right lateral flexion   Left lateral flexion   Right rotation 45  Left rotation 40*   (Blank rows = not tested)  *tension    UE Measurements Upper Extremity Right EVAL Left EVAL   A/PROM MMT A/PROM MMT  Shoulder Flexion WFL 3+* WFL 3+*  Shoulder Extension      Shoulder Abduction WFL 3+* WFL 3+*  Shoulder Adduction      Shoulder Internal Rotation Rechacs to L2 SP* 4- Reaches to T12 SP 4-  Shoulder External Rotation Reaches to C7 SP 4- Reaches T2 SP  4-  Elbow Flexion      Elbow Extension      Wrist Flexion      Wrist Extension      Wrist Supination      Wrist Pronation      Wrist Ulnar Deviation      Wrist Radial Deviation      Grip Strength NA  NA     (Blank rows = not tested)   * pain     TREATMENT DATE:  07/27/2023  Therapeutic Exercise:  Aerobic: Supine:towel roll down spine- 2 minutes, scap retraction on towel 2 minutes, hug with rocking on towel 2 minutes, horizontal shoulder abd on towel 2 minutes  Prone:  Seated:  Standing: self mobilization with tennis ball at wall to thoracic and scapular musculature Neuromuscular Re-education: Manual Therapy: Therapeutic Activity: Self Care: Trigger Point Dry Needling:  Modalities:     PATIENT EDUCATION:  Education details: on current presentation, on HEP, on clinical outcomes score and POC, pillow recommendations, posture and form  Person educated: Patient Education method: Explanation, Demonstration, and Handouts Education comprehension: verbalized understanding   HOME  EXERCISE PROGRAM: [id number]  ASSESSMENT:  CLINICAL IMPRESSION: Patient presents to physical therapy with multiple complaints, but primary complaint being neck pain at this time.  Patient presents with range of motion and strength limitations as well as postural limitations that are likely contributing to current pain presentation.  Session focused on education as well as establishing basic home exercise program to help address symptoms and limitations.  Patient would greatly benefit from skilled physical therapy to improve overall physical function and quality of life.  OBJECTIVE IMPAIRMENTS: decreased activity tolerance, decreased ROM, decreased strength, impaired UE functional use, improper body mechanics, postural dysfunction, and pain.   ACTIVITY LIMITATIONS: lifting and reach over head  PARTICIPATION LIMITATIONS: cleaning  PERSONAL FACTORS: Age, Fitness, and Time since onset of injury/illness/exacerbation are also affecting patient's functional outcome.   REHAB POTENTIAL: Good  CLINICAL DECISION MAKING: Stable/uncomplicated  EVALUATION COMPLEXITY: Low   GOALS: Goals reviewed with patient? yes  SHORT TERM GOALS: Target date: 09/07/2023   Patient will be independent in self management strategies to improve quality of life and functional outcomes. Baseline: New Program Goal status: INITIAL  2.  Patient will report at least 50% improvement in overall symptoms and/or function to demonstrate improved functional mobility Baseline: 0% better Goal status: INITIAL  3.  Patient will demonstrate pain-free neck range of motion in all directions Baseline: Painful Goal status: INITIAL      LONG TERM GOALS: Target date: 10/18/23  Patient will report at least 75% improvement in overall symptoms and/or function to demonstrate improved functional mobility Baseline: 0% better Goal status: INITIAL  2.  Patient will score at least 2 points higher on PSFS average to demonstrate change in  overall function. Baseline: see above Goal status: INITIAL  3.  Patient will demonstrate pain-free manual muscle testing in upper extremities Baseline: Painful Goal status: INITIAL     PLAN:  PT FREQUENCY: 1x/week  PT DURATION: 12 weeks  PLANNED INTERVENTIONS: 97110-Therapeutic exercises, 97530- Therapeutic activity, 97112- Neuromuscular re-education, 97535- Self Care, 47829- Manual therapy, [phone]- Gait training, [phone]- Orthotic Fit/training, [phone]- Canalith repositioning, [id number]- Aquatic Therapy, [id number]- Electrical stimulation (unattended), [phone]- Ionotophoresis 4mg /ml Dexamethasone, Patient/Family education, Balance training, Stair training, Taping, Dry Needling, Joint mobilization, Joint manipulation, Spinal manipulation, Spinal mobilization, Cryotherapy, and Moist heat   PLAN FOR NEXT SESSION: establish HEP for PT - thoracic mobility, STM as indicated, posture/sit bones/ over ball     7:54 AM, 07/27/23 Tereasa Coop, DPT Physical Therapy with [facility]

## 2023-07-27 ENCOUNTER — Other Ambulatory Visit: Payer: Self-pay | Admitting: Obstetrics and Gynecology

## 2023-07-27 DIAGNOSIS — N644 Mastodynia: Secondary | ICD-10-CM

## 2023-08-02 NOTE — Therapy (Unsigned)
 OUTPATIENT PHYSICAL THERAPY CERVICAL TREATMENT   Patient Name: Amanda [name] MRN: [id number] DOB:01-28-1951, 73 y.o., female Today's Date: 08/03/2023  END OF SESSION:  PT End of Session - 08/03/23 1432     Visit Number 2    Number of Visits 12    Date for PT Re-Evaluation 10/18/23    Authorization Type medicare - 20% coinsurance    Authorization Time Period `    Progress Note Due on Visit 10    PT Start Time 1433    PT Stop Time 1515    PT Time Calculation (min) 42 min    Activity Tolerance Patient tolerated treatment well    Behavior During Therapy [facility] for tasks assessed/performed              Past Medical History:  Diagnosis Date   Anemia    Anxiety    Celiac disease    Chronic constipation    Coronary artery spasm (HCC)    COVID 02/12/2021   COVID-19 virus vaccine not available    Erythema migrans (Lyme disease) 12/17/2021   GERD (gastroesophageal reflux disease)    occasionally (per pt takes apple cider vinager)   Heart palpitations    cardiologist-- dr Herbie [place]--- event monitor 08-24-2011 epic ;  nuclear stress test 03-16-2011 (epic) normal w/ no ischemia, ef 65%;  echo 01-18-2013  ef 60-65%, G1DD   Hiatal hernia    History of subdural hematoma    12/ 23/ 2010  s/p  craniotomy w/ hematoma evacuation (pt had a fall w/ concussion)  per pt no residual   History of thyroid cancer followed by dr Pollyann Kennedy---   s/p  left thyroidectomy --- per pt no radiation and no recurrence   Hyperlipidemia    Hypertension    IBS (irritable bowel syndrome)    Immunization, single disease 06/09/2019   1st dose moderma vaccine administered   Insomnia    OA (osteoarthritis)    Osteopenia, T score -2.1 FRAX 9.4%/1.3% stable from prior DEXA 06/2015   Restless leg syndrome    Right thyroid nodule    followed by dr Pollyann Kennedy--- last ultrasound in epic 03-30-2018 stable , to bx   STD (sexually transmitted disease), HSV    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 Normal EF.AB::   REDUCTION MAMMAPLASTY Bilateral    SUBDURAL HEMATOMA EVACUATION VIA CRANIOTOMY  04-30-2009  @[facility]    left frontotemporapartietal    THYROID LOBECTOMY Left 05-04-2001  @[facility]   dr Pollyann Kennedy   TONSILLECTOMY     TRANSTHORACIC ECHOCARDIOGRAM  02/03/2023   EF 60 to 65%.  Normal function.  No RWMA.  GR 1 DD normal RV.  Normal PAP.  Normal valves.  Normal pressures.   VULVECTOMY N/A 03/12/2019   Procedure: WIDE LOCAL EXCISION OF CARCINOMA IS SITU OF VULVA;  Surgeon: Dara Lords, MD;  Location: [facility];  Service: Gynecology;  Laterality: N/A;  request to follow in  [facility] block time requests one hour colposcope available in OR with acetic acid.   VULVECTOMY N/A 06/17/2019   Procedure: WIDE LOCAL  EXCISION VULVECTOMY;  Surgeon: Adolphus Birchwood, MD;  Location: [facility];  Service: Gynecology;  Laterality: N/A;   Patient Active Problem List   Diagnosis Date Noted   Coronary artery disease, non-occlusive 06/06/2023   Overweight 03/02/2023   Polyarthralgia 03/02/2023   ACS (acute coronary syndrome) (HCC) 02/02/2023   NSTEMI (non-ST elevated myocardial infarction) (HCC) 02/02/2023   Degeneration of lumbar intervertebral disc 09/07/2022   Deviated nasal septum 07/29/2022   Pain in right hand 04/26/2022   Essential hypertension 02/28/2022   Hoarseness 12/02/2021   Chronic constipation 05/18/2021   Elevated blood  pressure reading 03/06/2020   Dysphagia 12/23/2019   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   Pain in left knee 09/24/2019   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   History of subdural hematoma 03/26/2019   Herpes simplex type 1 infection 11/01/2018   History of Guillain-Barre syndrome 04/01/2018   Hyperlipidemia with target LDL less than 70 03/30/2018   Mastalgia, left, followed by GYN 03/29/2018   Fuchs' corneal dystrophy, 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   Insulin resistance 03/29/2018   History of hysterectomy 03/29/2018   Grade I diastolic dysfunction 03/29/2018   Thyroid nodule 03/27/2018   Presbycusis of both ears 03/10/2017   Dysthymia 09/26/2016   Palpitations 01/17/2013   GERD (gastroesophageal reflux disease)    Restless leg syndrome, on Requip    Allergic rhinitis 06/25/2009   Diaphragmatic hernia 02/16/2009   IBS (irritable bowel syndrome) 02/16/2009   Celiac disease 11/27/2007   Insomnia 07/05/2007   Osteoarthritis 07/05/2007   Osteopenia 10/03/2006   Left carotid bruit 10/03/2006   History of thyroid cancer 10/03/2006    PCP: Tana Conch, MD  REFERRING PROVIDER: Pincus Badder, FNP  REFERRING DIAG: M54.2 (ICD-10-CM) - Cervicalgia  THERAPY DIAG:  Cervicalgia  Muscle weakness (generalized)  Rationale for Evaluation and Treatment: Rehabilitation  ONSET DATE: a while ago  SUBJECTIVE:                                                                                                                                                                                                         SUBJECTIVE STATEMENT: 08/03/2023 States she feels good. Likes her exercises.   Eval: Patient has multiple joint complaints but primary complaint at this time is her neck.  Reports she does have occasional dizziness that she does not know if it is related to her neck.  Reports she  also has this tingling sensation along her upper back and right pinky  and ring finger.  No current tingling sensations noted.  Reports overall she is very active and works out and just needs to wear what to do for her symptoms reports that if she does anything for long period of time her back and neck will hurt.  Reports she has done some stretches but they have not significantly helped.  Reports she also has left knee pain and gets shots to manage her symptoms.     PERTINENT HISTORY:   TMJ pain, CAD,  NSTEMI, hx of thyroid cancer with thyroidectomy, subdural hematoma with craniotomy 2010, OA, osteopenia  PAIN:  Are you having pain? Yes: NPRS scale: 0/10 Pain location: middle of lower neck B Pain description: tightness Aggravating factors: looking up  Relieving factors: massage, stretch, heat   PRECAUTIONS: None  RED FLAGS: None     WEIGHT BEARING RESTRICTIONS: No  FALLS:  Has patient fallen in last 6 months? No    OCCUPATION: not current working  PLOF: Independent  PATIENT GOALS: to have less pain and knowing what to do for her neck     OBJECTIVE:  Note: Objective measures were completed at Evaluation unless otherwise noted.  DIAGNOSTIC FINDINGS:  No recent imaging  PATIENT SURVEYS:  Patient-specific activity functional scoring scheme (Point to one number):  "0" represents "unable to perform." "10" represents "able to perform at prior level. 0 1 2 3 4 5 6 7 8 9  10 (Date and Score) Activity Initial  Activity Eval     Looking up   2    Sleep- getting comfortable   6    vacuuming 0    Additional Additional Total score = sum of the activity scores/number of activities Minimum detectable change (90%CI) for average score = 2 points Minimum detectable change (90%CI) for single activity score = 3 points PSFS developed by: Jake Seats., & Binkley, J. (1995). Assessing disability and change on individual  patients: a report of a patient specific measure. Physiotherapy [place], 47, [phone]. Reproduced with the  permission of the authors  Score: 8/3= 2.66   COGNITION: Overall cognitive status: Within functional limits for tasks assessed  SENSATION: Not tested  POSTURE: forward head and posterior pelvic tilt  PALPATION: Tenderness to palpation along B cervical paraspinals and UT   CERVICAL ROM:   Active ROM A/PROM (deg) eval  Flexion 30*  Extension 20*  Right lateral flexion   Left lateral flexion   Right rotation 45  Left rotation 40*   (Blank rows = not tested)  *tension    UE Measurements Upper Extremity Right EVAL Left EVAL   A/PROM MMT A/PROM MMT  Shoulder Flexion WFL 3+* WFL 3+*  Shoulder Extension      Shoulder Abduction WFL 3+* WFL 3+*  Shoulder Adduction      Shoulder Internal Rotation Rechacs to L2 SP* 4- Reaches to T12 SP 4-  Shoulder External Rotation Reaches to C7 SP 4- Reaches T2 SP  4-  Elbow Flexion      Elbow Extension      Wrist Flexion      Wrist Extension      Wrist Supination      Wrist Pronation      Wrist Ulnar Deviation      Wrist Radial Deviation      Grip Strength NA  NA     (Blank rows = not tested)   * pain  TREATMENT DATE:                                                                                                                               08/03/2023  Therapeutic Exercise:  Reviewed HEP and answered all questions Supine:  Prone: lying over small deflated ball at different levels with deep breathing - felt best at lower thoracic/lower sternum - 10 minutes total   Seated:  Standing: self mobilization theracane - 10 minutes Neuromuscular Re-education:long exhale breathing supine in hook lying 10  minutes, posterior lateral breathing with tactile cues - 8 minutes Manual Therapy: Therapeutic Activity: Self Care: Trigger Point Dry Needling:  Modalities:     PATIENT EDUCATION:  Education details: on HEP and rationale for interventions  Person educated: Patient Education method: Explanation, Demonstration, and  Handouts Education comprehension: verbalized understanding   HOME EXERCISE PROGRAM: [id number]  ASSESSMENT:  CLINICAL IMPRESSION: 08/03/2023 Session focused on review of HEP and answering all questions. Added breathing exercises to HEP. Comfort noted with all exercises and no complaints of pain. Added new exercises to HEP and answered all questions, will continue with current POC as tolerated.   Eval: Patient presents to physical therapy with multiple complaints, but primary complaint being neck pain at this time.  Patient presents with range of motion and strength limitations as well as postural limitations that are likely contributing to current pain presentation.  Session focused on education as well as establishing basic home exercise program to help address symptoms and limitations.  Patient would greatly benefit from skilled physical therapy to improve overall physical function and quality of life.  OBJECTIVE IMPAIRMENTS: decreased activity tolerance, decreased ROM, decreased strength, impaired UE functional use, improper body mechanics, postural dysfunction, and pain.   ACTIVITY LIMITATIONS: lifting and reach over head  PARTICIPATION LIMITATIONS: cleaning  PERSONAL FACTORS: Age, Fitness, and Time since onset of injury/illness/exacerbation are also affecting patient's functional outcome.   REHAB POTENTIAL: Good  CLINICAL DECISION MAKING: Stable/uncomplicated  EVALUATION COMPLEXITY: Low   GOALS: Goals reviewed with patient? yes  SHORT TERM GOALS: Target date: 09/07/2023   Patient will be independent in self management strategies to improve quality of life and functional outcomes. Baseline: New Program Goal status: INITIAL  2.  Patient will report at least 50% improvement in overall symptoms and/or function to demonstrate improved functional mobility Baseline: 0% better Goal status: INITIAL  3.  Patient will demonstrate pain-free neck range of motion in all  directions Baseline: Painful Goal status: INITIAL      LONG TERM GOALS: Target date: 10/18/23  Patient will report at least 75% improvement in overall symptoms and/or function to demonstrate improved functional mobility Baseline: 0% better Goal status: INITIAL  2.  Patient will score at least 2 points higher on PSFS average to demonstrate change in overall function. Baseline: see above Goal status: INITIAL  3.  Patient will demonstrate pain-free manual muscle testing in upper extremities Baseline: Painful Goal status: INITIAL  PLAN:  PT FREQUENCY: 1x/week  PT DURATION: 12 weeks  PLANNED INTERVENTIONS: 97110-Therapeutic exercises, 97530- Therapeutic activity, 97112- Neuromuscular re-education, [phone]- Self Care, 95621- Manual therapy, [phone]- Gait training, [phone]- Orthotic Fit/training, [phone]- Canalith repositioning, [id number]- Aquatic Therapy, [id number]- Electrical stimulation (unattended), [phone]- Ionotophoresis 4mg /ml Dexamethasone, Patient/Family education, Balance training, Stair training, Taping, Dry Needling, Joint mobilization, Joint manipulation, Spinal manipulation, Spinal mobilization, Cryotherapy, and Moist heat   PLAN FOR NEXT SESSION:review breathing exercises,  establish HEP for PT - thoracic mobility, STM as indicated, posture/sit bones/ over ball     3:38 PM, 08/03/23 Tereasa Coop, DPT Physical Therapy with [facility]

## 2023-08-03 ENCOUNTER — Encounter: Payer: Self-pay | Admitting: Physical Therapy

## 2023-08-03 ENCOUNTER — Ambulatory Visit (INDEPENDENT_AMBULATORY_CARE_PROVIDER_SITE_OTHER): Admitting: Physical Therapy

## 2023-08-03 DIAGNOSIS — M6281 Muscle weakness (generalized): Secondary | ICD-10-CM | POA: Diagnosis not present

## 2023-08-03 DIAGNOSIS — M542 Cervicalgia: Secondary | ICD-10-CM | POA: Diagnosis not present

## 2023-08-04 DIAGNOSIS — E785 Hyperlipidemia, unspecified: Secondary | ICD-10-CM | POA: Diagnosis not present

## 2023-08-06 LAB — NMR, LIPOPROFILE
Cholesterol, Total: 263 mg/dL — ABNORMAL HIGH (ref 100–199)
HDL Particle Number: 39 umol/L (ref >=30.5)
HDL-C: 82 mg/dL (ref >39)
LDL Particle Number: 1888 nmol/L — ABNORMAL HIGH (ref <1000)
LDL Size: 21.2 nm (ref >20.5)
LDL-C (NIH Calc): 167 mg/dL — ABNORMAL HIGH (ref 0–99)
LP-IR Score: <25 (ref <=45)
Small LDL Particle Number: 634 nmol/L — ABNORMAL HIGH (ref <=527)
Triglycerides: 84 mg/dL (ref 0–149)

## 2023-08-06 LAB — APOLIPOPROTEIN B: Apolipoprotein B: 144 mg/dL — ABNORMAL HIGH (ref <90)

## 2023-08-07 DIAGNOSIS — D2262 Melanocytic nevi of left upper limb, including shoulder: Secondary | ICD-10-CM | POA: Diagnosis not present

## 2023-08-07 DIAGNOSIS — L570 Actinic keratosis: Secondary | ICD-10-CM | POA: Diagnosis not present

## 2023-08-07 DIAGNOSIS — D225 Melanocytic nevi of trunk: Secondary | ICD-10-CM | POA: Diagnosis not present

## 2023-08-07 DIAGNOSIS — D2272 Melanocytic nevi of left lower limb, including hip: Secondary | ICD-10-CM | POA: Diagnosis not present

## 2023-08-07 DIAGNOSIS — L821 Other seborrheic keratosis: Secondary | ICD-10-CM | POA: Diagnosis not present

## 2023-08-07 DIAGNOSIS — Z85828 Personal history of other malignant neoplasm of skin: Secondary | ICD-10-CM | POA: Diagnosis not present

## 2023-08-07 DIAGNOSIS — D1801 Hemangioma of skin and subcutaneous tissue: Secondary | ICD-10-CM | POA: Diagnosis not present

## 2023-08-07 DIAGNOSIS — L812 Freckles: Secondary | ICD-10-CM | POA: Diagnosis not present

## 2023-08-07 DIAGNOSIS — D2261 Melanocytic nevi of right upper limb, including shoulder: Secondary | ICD-10-CM | POA: Diagnosis not present

## 2023-08-08 ENCOUNTER — Encounter: Payer: Self-pay | Admitting: Physical Therapy

## 2023-08-08 ENCOUNTER — Ambulatory Visit (INDEPENDENT_AMBULATORY_CARE_PROVIDER_SITE_OTHER): Admitting: Physical Therapy

## 2023-08-08 DIAGNOSIS — M542 Cervicalgia: Secondary | ICD-10-CM | POA: Diagnosis not present

## 2023-08-08 DIAGNOSIS — M6281 Muscle weakness (generalized): Secondary | ICD-10-CM | POA: Diagnosis not present

## 2023-08-08 NOTE — Therapy (Signed)
 OUTPATIENT PHYSICAL THERAPY CERVICAL TREATMENT   Patient Name: Amanda [name] MRN: [id number] DOB:April 20, 1951, 73 y.o., female Today's Date: 08/08/2023  END OF SESSION:  PT End of Session - 08/08/23 1306     Visit Number 3    Number of Visits 12    Date for PT Re-Evaluation 10/18/23    Authorization Type medicare - 20% coinsurance    Authorization Time Period `    Progress Note Due on Visit 10    PT Start Time 1307    PT Stop Time 1347    PT Time Calculation (min) 40 min    Activity Tolerance Patient tolerated treatment well    Behavior During Therapy [facility] for tasks assessed/performed              Past Medical History:  Diagnosis Date   Anemia    Anxiety    Celiac disease    Chronic constipation    Coronary artery spasm (HCC)    COVID 02/12/2021   COVID-19 virus vaccine not available    Erythema migrans (Lyme disease) 12/17/2021   GERD (gastroesophageal reflux disease)    occasionally (per pt takes apple cider vinager)   Heart palpitations    cardiologist-- dr Herbie [place]--- event monitor 08-24-2011 epic ;  nuclear stress test 03-16-2011 (epic) normal w/ no ischemia, ef 65%;  echo 01-18-2013  ef 60-65%, G1DD   Hiatal hernia    History of subdural hematoma    12/ 23/ 2010  s/p  craniotomy w/ hematoma evacuation (pt had a fall w/ concussion)  per pt no residual   History of thyroid cancer followed by dr Pollyann Kennedy---   s/p  left thyroidectomy --- per pt no radiation and no recurrence   Hyperlipidemia    Hypertension    IBS (irritable bowel syndrome)    Immunization, single disease 06/09/2019   1st dose moderma vaccine administered   Insomnia    OA (osteoarthritis)    Osteopenia, T score -2.1 FRAX 9.4%/1.3% stable from prior DEXA 06/2015   Restless leg syndrome    Right thyroid nodule    followed by dr Pollyann Kennedy--- last ultrasound in epic 03-30-2018 stable , to bx   STD (sexually transmitted disease), HSV    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 Normal EF.AB::   REDUCTION MAMMAPLASTY Bilateral    SUBDURAL HEMATOMA EVACUATION VIA CRANIOTOMY  04-30-2009  @[facility]    left frontotemporapartietal    THYROID LOBECTOMY Left 05-04-2001  @[facility]   dr Pollyann Kennedy   TONSILLECTOMY     TRANSTHORACIC ECHOCARDIOGRAM  02/03/2023   EF 60 to 65%.  Normal function.  No RWMA.  GR 1 DD normal RV.  Normal PAP.  Normal valves.  Normal pressures.   VULVECTOMY N/A 03/12/2019   Procedure: WIDE LOCAL EXCISION OF CARCINOMA IS SITU OF VULVA;  Surgeon: Dara Lords, MD;  Location: [facility];  Service: Gynecology;  Laterality: N/A;  request to follow in  [facility] block time requests one hour colposcope available in OR with acetic acid.   VULVECTOMY N/A 06/17/2019   Procedure: WIDE LOCAL  EXCISION VULVECTOMY;  Surgeon: Adolphus Birchwood, MD;  Location: [facility];  Service: Gynecology;  Laterality: N/A;   Patient Active Problem List   Diagnosis Date Noted   Coronary artery disease, non-occlusive 06/06/2023   Overweight 03/02/2023   Polyarthralgia 03/02/2023   ACS (acute coronary syndrome) (HCC) 02/02/2023   NSTEMI (non-ST elevated myocardial infarction) (HCC) 02/02/2023   Degeneration of lumbar intervertebral disc 09/07/2022   Deviated nasal septum 07/29/2022   Pain in right hand 04/26/2022   Essential hypertension 02/28/2022   Hoarseness 12/02/2021   Chronic constipation 05/18/2021   Elevated blood  pressure reading 03/06/2020   Dysphagia 12/23/2019   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   Pain in left knee 09/24/2019   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   History of subdural hematoma 03/26/2019   Herpes simplex type 1 infection 11/01/2018   History of Guillain-Barre syndrome 04/01/2018   Hyperlipidemia with target LDL less than 70 03/30/2018   Mastalgia, left, followed by GYN 03/29/2018   Fuchs' corneal dystrophy, 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   Insulin resistance 03/29/2018   History of hysterectomy 03/29/2018   Grade I diastolic dysfunction 03/29/2018   Thyroid nodule 03/27/2018   Presbycusis of both ears 03/10/2017   Dysthymia 09/26/2016   Palpitations 01/17/2013   GERD (gastroesophageal reflux disease)    Restless leg syndrome, on Requip    Allergic rhinitis 06/25/2009   Diaphragmatic hernia 02/16/2009   IBS (irritable bowel syndrome) 02/16/2009   Celiac disease 11/27/2007   Insomnia 07/05/2007   Osteoarthritis 07/05/2007   Osteopenia 10/03/2006   Left carotid bruit 10/03/2006   History of thyroid cancer 10/03/2006    PCP: Tana Conch, MD  REFERRING PROVIDER: Pincus Badder, FNP  REFERRING DIAG: M54.2 (ICD-10-CM) - Cervicalgia  THERAPY DIAG:  Cervicalgia  Muscle weakness (generalized)  Rationale for Evaluation and Treatment: Rehabilitation  ONSET DATE: a while ago  SUBJECTIVE:                                                                                                                                                                                                         SUBJECTIVE STATEMENT: 08/08/2023 Feeling good bought the ball and needs to inflate. No difficulties with exercises. Wants to review exercises with minimal assist    Eval: Patient has multiple joint complaints but primary complaint at this time is her neck.  Reports she does have occasional dizziness that  she does not know if it is related to her neck.   Reports she also has this tingling sensation along her upper back and right pinky and ring finger.  No current tingling sensations noted.  Reports overall she is very active and works out and just needs to wear what to do for her symptoms reports that if she does anything for long period of time her back and neck will hurt.  Reports she has done some stretches but they have not significantly helped.  Reports she also has left knee pain and gets shots to manage her symptoms.     PERTINENT HISTORY:   TMJ pain, CAD,  NSTEMI, hx of thyroid cancer with thyroidectomy, subdural hematoma with craniotomy 2010, OA, osteopenia  PAIN:  Are you having pain? Yes: NPRS scale: 0/10 Pain location: middle of lower neck B Pain description: tightness Aggravating factors: looking up  Relieving factors: massage, stretch, heat   PRECAUTIONS: None  RED FLAGS: None     WEIGHT BEARING RESTRICTIONS: No  FALLS:  Has patient fallen in last 6 months? No    OCCUPATION: not current working  PLOF: Independent  PATIENT GOALS: to have less pain and knowing what to do for her neck     OBJECTIVE:  Note: Objective measures were completed at Evaluation unless otherwise noted.  DIAGNOSTIC FINDINGS:  No recent imaging  PATIENT SURVEYS:  Patient-specific activity functional scoring scheme (Point to one number):  "0" represents "unable to perform." "10" represents "able to perform at prior level. 0 1 2 3 4 5 6 7 8 9  10 (Date and Score) Activity Initial  Activity Eval     Looking up   2    Sleep- getting comfortable   6    vacuuming 0    Additional Additional Total score = sum of the activity scores/number of activities Minimum detectable change (90%CI) for average score = 2 points Minimum detectable change (90%CI) for single activity score = 3 points PSFS developed by: Jake Seats., & Binkley, J. (1995). Assessing disability and change on individual  patients: a report of a  patient specific measure. Physiotherapy [place], [phone], [phone]. Reproduced with the permission of the authors  Score: 8/3= 2.66   COGNITION: Overall cognitive status: Within functional limits for tasks assessed  SENSATION: Not tested  POSTURE: forward head and posterior pelvic tilt  PALPATION: Tenderness to palpation along B cervical paraspinals and UT   CERVICAL ROM:   Active ROM A/PROM (deg) eval  Flexion 30*  Extension 20*  Right lateral flexion   Left lateral flexion   Right rotation 45  Left rotation 40*   (Blank rows = not tested)  *tension    UE Measurements Upper Extremity Right EVAL Left EVAL   A/PROM MMT A/PROM MMT  Shoulder Flexion WFL 3+* WFL 3+*  Shoulder Extension      Shoulder Abduction WFL 3+* WFL 3+*  Shoulder Adduction      Shoulder Internal Rotation Rechacs to L2 SP* 4- Reaches to T12 SP 4-  Shoulder External Rotation Reaches to C7 SP 4- Reaches T2 SP  4-  Elbow Flexion      Elbow Extension      Wrist Flexion      Wrist Extension      Wrist Supination      Wrist Pronation      Wrist Ulnar Deviation      Wrist Radial Deviation      Grip Strength NA  NA     (  Blank rows = not tested)   * pain     TREATMENT DATE:                                                                                                                               08/08/2023  Therapeutic Exercise:  Reviewed HEP and answered all questions Supine: laying over towel  down spine with rest 2 minutes, scap retraction x15 5" holds, horizontal shoulder abd x20 B, nec rotation on ball 2 minutes  Prone:    Seated:  Standing:  Neuromuscular Re-education:long exhale breathing supine in hook lying 6  minutes, posterior lateral breathing with tactile cues - 6 minutes, prone breathing over ball focus on posterior lateral breathing 5 minutes Manual Therapy:IASTM to left knee/adductors with padding 6 minutes - tolerated well  Therapeutic Activity: Self Care: on edema management -  elevation/ankle pumps self massage - use of compression garments Trigger Point Dry Needling:  Modalities:     PATIENT EDUCATION:  Education details: on HEP and rationale for interventions  Person educated: Patient Education method: Explanation, Demonstration, and Handouts Education comprehension: verbalized understanding   HOME EXERCISE PROGRAM: [id number] Long exhale breathing Posterior lateral breathing  ASSESSMENT:  CLINICAL IMPRESSION: 08/08/2023  Session focused on review of entire exercise program and answering all questions. Assessed knee pain as this is now primary concern at this time. Swelling noted in bilateral lower legs and left knee. Educated patient in importance of swelling management and how to achieve this. Tolerated use of percussion gun with towel well. Overall reduced pain noted end of session, will continue with current POC as tolerated.   Eval: Patient presents to physical therapy with multiple complaints, but primary complaint being neck pain at this time.  Patient presents with range of motion and strength limitations as well as postural limitations that are likely contributing to current pain presentation.  Session focused on education as well as establishing basic home exercise program to help address symptoms and limitations.  Patient would greatly benefit from skilled physical therapy to improve overall physical function and quality of life.  OBJECTIVE IMPAIRMENTS: decreased activity tolerance, decreased ROM, decreased strength, impaired UE functional use, improper body mechanics, postural dysfunction, and pain.   ACTIVITY LIMITATIONS: lifting and reach over head  PARTICIPATION LIMITATIONS: cleaning  PERSONAL FACTORS: Age, Fitness, and Time since onset of injury/illness/exacerbation are also affecting patient's functional outcome.   REHAB POTENTIAL: Good  CLINICAL DECISION MAKING: Stable/uncomplicated  EVALUATION COMPLEXITY: Low   GOALS: Goals  reviewed with patient? yes  SHORT TERM GOALS: Target date: 09/07/2023   Patient will be independent in self management strategies to improve quality of life and functional outcomes. Baseline: New Program Goal status: INITIAL  2.  Patient will report at least 50% improvement in overall symptoms and/or function to demonstrate improved functional mobility Baseline: 0% better Goal status: INITIAL  3.  Patient will demonstrate pain-free neck range of motion in all directions Baseline: Painful Goal status:  INITIAL      LONG TERM GOALS: Target date: 10/18/23  Patient will report at least 75% improvement in overall symptoms and/or function to demonstrate improved functional mobility Baseline: 0% better Goal status: INITIAL  2.  Patient will score at least 2 points higher on PSFS average to demonstrate change in overall function. Baseline: see above Goal status: INITIAL  3.  Patient will demonstrate pain-free manual muscle testing in upper extremities Baseline: Painful Goal status: INITIAL     PLAN:  PT FREQUENCY: 1x/week  PT DURATION: 12 weeks  PLANNED INTERVENTIONS: 97110-Therapeutic exercises, 97530- Therapeutic activity, 97112- Neuromuscular re-education, 97535- Self Care, 69629- Manual therapy, [phone]- Gait training, [phone]- Orthotic Fit/training, [phone]- Canalith repositioning, [id number]- Aquatic Therapy, [id number]- Electrical stimulation (unattended), [phone]- Ionotophoresis 4mg /ml Dexamethasone, Patient/Family education, Balance training, Stair training, Taping, Dry Needling, Joint mobilization, Joint manipulation, Spinal manipulation, Spinal mobilization, Cryotherapy, and Moist heat   PLAN FOR NEXT SESSION:review breathing exercises,  establish HEP for PT - thoracic mobility, STM as indicated, posture/sit bones/ over ball     2:02 PM, 08/08/23 Tereasa Coop, DPT Physical Therapy with [facility]

## 2023-08-10 ENCOUNTER — Encounter

## 2023-08-10 ENCOUNTER — Other Ambulatory Visit

## 2023-08-11 ENCOUNTER — Ambulatory Visit (HOSPITAL_BASED_OUTPATIENT_CLINIC_OR_DEPARTMENT_OTHER): Payer: Medicare Other | Admitting: Internal Medicine

## 2023-08-11 VITALS — BP 120/82 | HR 63 | Ht 65.0 in | Wt 151.6 lb

## 2023-08-11 DIAGNOSIS — Z789 Other specified health status: Secondary | ICD-10-CM

## 2023-08-11 DIAGNOSIS — I214 Non-ST elevation (NSTEMI) myocardial infarction: Secondary | ICD-10-CM | POA: Diagnosis not present

## 2023-08-11 DIAGNOSIS — I2510 Atherosclerotic heart disease of native coronary artery without angina pectoris: Secondary | ICD-10-CM

## 2023-08-11 DIAGNOSIS — E785 Hyperlipidemia, unspecified: Secondary | ICD-10-CM

## 2023-08-11 NOTE — Patient Instructions (Signed)
 Medication Instructions:  Your physician recommends that you continue on your current medications as directed. Please refer to the Current Medication list given to you today.  *If you need a refill on your cardiac medications before your next appointment, please call your pharmacy*  Follow-Up: At [facility], you and your health needs are our priority.  As part of our continuing mission to provide you with exceptional heart care, our providers are all part of one team.  This team includes your primary Cardiologist (physician) and Advanced Practice Providers or APPs (Physician Assistants and Nurse Practitioners) who all work together to provide you with the care you need, when you need it.  Your next appointment:   We will see you on an as needed basis   Provider:   Kirtland Bouchard [place] Hilty, MD    We recommend signing up for the patient portal called "MyChart".  Sign up information is provided on this After Visit Summary.  MyChart is used to connect with patients for Virtual Visits (Telemedicine).  Patients are able to view lab/test results, encounter notes, upcoming appointments, etc.  Non-urgent messages can be sent to your provider as well.   To learn more about what you can do with MyChart, go to [email or web address].

## 2023-08-11 NOTE — Progress Notes (Signed)
 LIPID CLINIC CONSULT NOTE  Chief Complaint:  Manage dyslipidemia  Primary Care Physician: Shelva Majestic, MD  Primary Cardiologist:  Bryan Lemma, MD  HPI:  Amanda [name] is a 73 y.o. female who is being seen today for the evaluation of dyslipidemia at the request of Shelva Majestic, MD. this is a pleasant 73 year old female kindly referred by Azalee Course, PA-C but followed by Dr. Herbie Baltimore.  She has a history of minimal nonobstructive coronary artery disease by cath in September 2024.  Labs in November showed total cholesterol 224, triglycerides 75, HDL 85 and LDL 126.  This is an improvement from 163 however remains above a target LDL less than 70.  She is currently on rosuvastatin 40 mg daily.  However, compliance with the medicine is reduced because she has noted some possible side effects.  08/11/2023  Amanda [name] returns today for follow-up.  Unfortunately her cholesterol has not substantially improved in fact her LDL is a little higher than it has been in the past.  Her LDL particle number was 1888 with an LDL of 167, triglycerides 84 and HDL 82.  Small LDL particle number was 634.  Her ApoB is also high at 144.  She is primarily tried to manage this with diet.  She reports being "on and off of ezetimibe".  She is concerned about possible memory issues with the medication.  That being said if she is having side effects with that and she could not previously tolerate statins, she may be a candidate for PCSK9 inhibitor.  This certainly could help with her LDL cholesterol which is well above a target LDL less than 70.  PMHx:  Past Medical History:  Diagnosis Date   Anemia    Anxiety    Celiac disease    Chronic constipation    Coronary artery spasm (HCC)    COVID 02/12/2021   COVID-19 virus vaccine not available    Erythema migrans (Lyme disease) 12/17/2021   GERD (gastroesophageal reflux disease)    occasionally (per pt takes apple cider vinager)   Heart palpitations     cardiologist-- dr Herbie Baltimore--- event monitor 08-24-2011 epic ;  nuclear stress test 03-16-2011 (epic) normal w/ no ischemia, ef 65%;  echo 01-18-2013  ef 60-65%, G1DD   Hiatal hernia    History of subdural hematoma    12/ 23/ 2010  s/p  craniotomy w/ hematoma evacuation (pt had a fall w/ concussion)  per pt no residual   History of thyroid cancer followed by dr Pollyann Kennedy---   s/p  left thyroidectomy --- per pt no radiation and no recurrence   Hyperlipidemia    Hypertension    IBS (irritable bowel syndrome)    Immunization, single disease 06/09/2019   1st dose moderma vaccine administered   Insomnia    OA (osteoarthritis)    Osteopenia, T score -2.1 FRAX 9.4%/1.3% stable from prior DEXA 06/2015   Restless leg syndrome    Right thyroid nodule    followed by dr Pollyann Kennedy--- last ultrasound in epic 03-30-2018 stable , to bx   STD (sexually transmitted disease), HSV    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 Normal EF.AB::   REDUCTION MAMMAPLASTY Bilateral    SUBDURAL HEMATOMA EVACUATION VIA CRANIOTOMY  04-30-2009  @[facility]    left frontotemporapartietal    THYROID LOBECTOMY Left 05-04-2001  @[facility]   dr Pollyann Kennedy   TONSILLECTOMY     TRANSTHORACIC ECHOCARDIOGRAM  02/03/2023   EF 60 to 65%.  Normal function.  No RWMA.  GR 1 DD normal RV.  Normal PAP.  Normal valves.  Normal pressures.   VULVECTOMY N/A 03/12/2019   Procedure: WIDE LOCAL EXCISION OF CARCINOMA  IS SITU OF VULVA;  Surgeon: Dara Lords, MD;  Location: [facility];  Service: Gynecology;  Laterality: N/A;  request to follow in [facility] block time requests one hour colposcope available in OR with acetic acid.   VULVECTOMY N/A 06/17/2019   Procedure: WIDE LOCAL  EXCISION VULVECTOMY;  Surgeon: Adolphus Birchwood, MD;  Location: [facility];  Service: Gynecology;  Laterality: N/A;    FAMHx:  Family History  Problem Relation Age of Onset   Failure to thrive Mother        91   Liver cancer Father        alcohol related likely   Hypertension Father    Heart disease Maternal Grandmother    Heart disease Maternal Grandfather    Hyperlipidemia Maternal Grandfather    Hypertension Paternal Grandfather    Diabetes Maternal Aunt    Breast cancer Maternal Aunt 54   Colon cancer Neg Hx    Thyroid disease Neg Hx     SOCHx:   reports that she has never smoked. She has never used smokeless tobacco. She reports current alcohol use. She reports that she does not use drugs.  ALLERGIES:  Allergies  Allergen Reactions   Ambien [Zolpidem Tartrate] Other (See Comments)    Causes Neurological problems with very bad dizziness, pains, disorientation   Cortisone Other (See Comments)    REACTION: "mania" post ESI   Eszopiclone And Related Other (See Comments)    Causes Neurological problems with very bad dizziness, pains, disorientation   Levofloxacin Other (See Comments)    Muscle spasms and jerking movements   Zolpidem Other (See Comments)    Causes Neurological problems with very bad dizziness, pains, disorientation   Dicloxacillin Other (See Comments)    Stomach problems, swelling of tongue   Dilantin [Phenytoin Sodium Extended] Hives and Itching   Erythromycin Other (See Comments)    Tears stomach up   Amlodipine     Head jerks at night   Avelox [Moxifloxacin Hcl In Nacl] Other (See Comments)    Pt unsure of reaction.   Eszopiclone Other (See  Comments)   Gluten Meal Other (See Comments)   Influenza Virus Vaccine Other (See Comments)   Isosorbide    Loratadine    Moxifloxacin Other (See Comments)   Nefazodone Other (See Comments)   Other Other (See Comments)    Celiac disease   Phenytoin Other (See Comments)   Trazodone Other (See Comments)   Nitrofuran Derivatives Other (See Comments)    unknown   Phenytoin Sodium Extended Rash   Trazodone And Nefazodone Other (See Comments)    dizzy    ROS: Pertinent items noted in HPI and remainder of comprehensive ROS otherwise negative.  HOME MEDS: Current Outpatient  Medications on File Prior to Visit  Medication Sig Dispense Refill   APPLE CIDER VINEGAR PO Take 1 tablet by mouth in the morning, at noon, and at bedtime.     aspirin EC 81 MG tablet Take 1 tablet (81 mg total) by mouth daily. Swallow whole. 30 tablet 3   bisacodyl (DULCOLAX) 5 MG EC tablet Take 5 mg by mouth daily as needed for moderate constipation.     CALCIUM PO Take 2 tablets by mouth daily.     clonazePAM (KLONOPIN) 0.5 MG tablet Take 1 tablet (0.5 mg total) by mouth at bedtime as needed for anxiety (sleep). 90 tablet 1   CO ENZYME Q-10 PO Take 1 tablet by mouth daily.     Cyanocobalamin (VITAMIN B-12 ER PO) Take 1 tablet by mouth daily.     escitalopram (LEXAPRO) 10 MG tablet TAKE 1 TABLET BY MOUTH EVERYDAY AT BEDTIME 90 tablet 2   ezetimibe (ZETIA) 10 MG tablet Take 1 tablet (10 mg total) by mouth daily. 90 tablet 3   MAGNESIUM PO Take 1 tablet by mouth at bedtime.     Melatonin 3 MG CAPS Take 6 mg by mouth at bedtime.     metoprolol succinate (TOPROL-XL) 25 MG 24 hr tablet TAKE 1 TABLET BY MOUTH EVERY DAY WITH OR IMMEDIATELY FOLLOWING A MEAL 90 tablet 3   nitroGLYCERIN (NITROSTAT) 0.4 MG SL tablet Place 1 tablet (0.4 mg total) under the tongue every 5 (five) minutes x 3 doses as needed for chest pain. 20 tablet 2   OVER THE COUNTER MEDICATION Take 1 tablet by mouth at bedtime. Renew Life [place]      OVER THE COUNTER MEDICATION Take 1 tablet by mouth daily. Life extension     Polyethyl Glycol-Propyl Glycol (SYSTANE OP) Place 2 drops into both eyes as needed (dryness).     rOPINIRole (REQUIP) 0.5 MG tablet Take 1 tablet (0.5 mg total) by mouth at bedtime. 90 tablet 3   Turmeric (QC TUMERIC COMPLEX PO) Take 1 capsule by mouth daily.     valACYclovir (VALTREX) 500 MG tablet ONE TAB TWICE A DAY AS NEEDED FOR OUTBREAK 180 tablet 1   Vitamin D-Vitamin K (VITAMIN K2-VITAMIN D3 PO) Take 1 capsule by mouth daily.     No current facility-administered medications on file prior to visit.    LABS/IMAGING: No results found for this or any previous visit (from the past 48 hours). No results found.  LIPID PANEL:    Component Value Date/Time   CHOL 224 (H) 03/22/2023 1048   CHOL 315 (H) 05/15/2015 1136   TRIG 75 03/22/2023 1048   TRIG 92 05/15/2015 1136   TRIG 70 04/12/2006 1129   HDL 85 03/22/2023 1048   HDL 84 05/15/2015 1136   CHOLHDL 2.6 03/22/2023 1048   CHOLHDL 3.3 02/03/2023 0416   VLDL 13 02/03/2023 0416   LDLCALC 126 (H) 03/22/2023 1048   LDLCALC 121 (H) 02/14/2020 1603   LDLCALC 213 (H) 05/15/2015 1136   LDLDIRECT 139.2 03/10/2008 1036    WEIGHTS: Wt Readings from Last 3 Encounters:  08/11/23 151 lb 9.6 oz (68.8 kg)  07/25/23 148 lb 6.4 oz (67.3 kg)  07/12/23 152 lb 6.4 oz (69.1 kg)    VITALS: BP 120/82   Pulse 63   Ht 5\' 5"  (1.651 m)   Wt 151 lb 9.6 oz (68.8 kg)   LMP  (LMP Unknown)   SpO2 100%   BMI 25.23 kg/m   EXAM: Deferred  EKG: Deferred  ASSESSMENT: Mixed dyslipidemia, goal LDL less than 70 -Genetic testing showed 2 variants of unknown significance including ApoA5 and GCKR, both predict possible high triglycerides, which she does not have) Minimal nonobstructive coronary disease by cath (01/2023) Statin intolerant-memory problems Possible ezetimibe intolerance  PLAN: 1.   Amanda [name] has had potential side effects with statins and ezetimibe  concerning for memory issues.  She is currently not been compliant with the ezetimibe.  We talked about a PCSK9 inhibitor as another option.  And then she decided that she might consider statin again.  I think she needs to really think more about her risk and whether or not she is willing to take therapies to lower cholesterol further.  If she is interested in proceeding she can reach out to me otherwise plan follow-up with me as needed.  TIME SPENT WITH PATIENT: 25 minutes of direct patient care. More than 50% of that time was spent on coordination of care and counseling regarding anemia, coronary artery disease, genetic testing results, various treatment options for lipid-lowering.   Chrystie Nose, MD, [facility], FACP  [place]  [facility]  Medical Director of the [facility] Lipid [facility] &  Cardiovascular Risk Reduction Clinic Diplomate of the American Board of Clinical Lipidology Attending Cardiologist  Direct Dial: [phone]  Fax: [phone]  Website:  [email or web address]  Lisette Abu Victorio Creeden 08/11/2023, 10:31 AM

## 2023-08-21 ENCOUNTER — Encounter: Payer: Self-pay | Admitting: Obstetrics and Gynecology

## 2023-08-29 ENCOUNTER — Encounter: Admitting: Physical Therapy

## 2023-09-05 ENCOUNTER — Ambulatory Visit (INDEPENDENT_AMBULATORY_CARE_PROVIDER_SITE_OTHER): Admitting: Physical Therapy

## 2023-09-05 ENCOUNTER — Encounter: Payer: Self-pay | Admitting: Physical Therapy

## 2023-09-05 DIAGNOSIS — M542 Cervicalgia: Secondary | ICD-10-CM | POA: Diagnosis not present

## 2023-09-05 DIAGNOSIS — M6281 Muscle weakness (generalized): Secondary | ICD-10-CM | POA: Diagnosis not present

## 2023-09-05 NOTE — Therapy (Signed)
 OUTPATIENT PHYSICAL THERAPY CERVICAL TREATMENT PHYSICAL THERAPY DISCHARGE SUMMARY  Visits from Start of Care: 4  Current functional level related to goals / functional outcomes: See below   Remaining deficits: See below   Education / Equipment: See below   Patient agrees to discharge. Patient goals were not met. Patient is being discharged due to meeting the stated rehab goals.   Patient Name: Amanda [name] MRN: [id number] DOB:Mar 22, 1951, 73 y.o., female Today's Date: 09/05/2023  END OF SESSION:  PT End of Session - 09/05/23 1302     Visit Number 4    Number of Visits 12    Date for PT Re-Evaluation 10/18/23    Authorization Type medicare - 20% coinsurance    Authorization Time Period `    Progress Note Due on Visit 10    PT Start Time 1303    PT Stop Time 1347 (P)     PT Time Calculation (min) 44 min (P)     Activity Tolerance Patient tolerated treatment well    Behavior During Therapy WFL for tasks assessed/performed              Past Medical History:  Diagnosis Date   Anemia    Anxiety    Celiac disease    Chronic constipation    Coronary artery spasm (HCC)    COVID 02/12/2021   COVID-19 virus vaccine not available    Erythema migrans (Lyme disease) 12/17/2021   GERD (gastroesophageal reflux disease)    occasionally (per pt takes apple cider vinager)   Heart palpitations    cardiologist-- dr Addie Holstein--- event monitor 08-24-2011 epic ;  nuclear stress test 03-16-2011 (epic) normal w/ no ischemia, ef 65%;  echo 01-18-2013  ef 60-65%, G1DD   Hiatal hernia    History of subdural hematoma    12/ 23/ 2010  s/p  craniotomy w/ hematoma evacuation (pt had a fall w/ concussion)  per pt no residual   History of thyroid  cancer followed by dr Donalee Fruits---   s/p  left thyroidectomy --- per pt no radiation and no recurrence   Hyperlipidemia    Hypertension    IBS (irritable bowel syndrome)    Immunization, single disease 06/09/2019   1st dose moderma vaccine  administered   Insomnia    OA (osteoarthritis)    Osteopenia, T score -2.1 FRAX 9.4%/1.3% stable from prior DEXA 06/2015   Restless leg syndrome    Right thyroid  nodule    followed by dr Donalee Fruits--- last ultrasound in epic 03-30-2018 stable , to bx   STD (sexually transmitted disease), HSV    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 Normal EF.AB::   REDUCTION MAMMAPLASTY Bilateral    SUBDURAL HEMATOMA EVACUATION VIA CRANIOTOMY  04-30-2009  @[facility]    left frontotemporapartietal    THYROID  LOBECTOMY Left 05-04-2001  @[facility]   dr Donalee Fruits   TONSILLECTOMY     TRANSTHORACIC ECHOCARDIOGRAM  02/03/2023   EF 60 to  65%.  Normal function.  No RWMA.  GR 1 DD normal RV.  Normal PAP.  Normal valves.  Normal pressures.   VULVECTOMY N/A 03/12/2019   Procedure: WIDE LOCAL EXCISION OF CARCINOMA IS SITU OF VULVA;  Surgeon: Lacretia Piccolo, MD;  Location: [facility];  Service: Gynecology;  Laterality: N/A;  request to follow in [facility] block time requests one hour colposcope available in OR with acetic acid .   VULVECTOMY N/A 06/17/2019   Procedure: WIDE LOCAL  EXCISION VULVECTOMY;  Surgeon: Alphonso Aschoff, MD;  Location: [facility];  Service: Gynecology;  Laterality: N/A;   Patient Active Problem List   Diagnosis Date Noted   Coronary artery disease, non-occlusive 06/06/2023   Overweight 03/02/2023   Polyarthralgia 03/02/2023    ACS (acute coronary syndrome) (HCC) 02/02/2023   NSTEMI (non-ST elevated myocardial infarction) (HCC) 02/02/2023   Degeneration of lumbar intervertebral disc 09/07/2022   Deviated nasal septum 07/29/2022   Pain in right hand 04/26/2022   Essential hypertension 02/28/2022   Hoarseness 12/02/2021   Chronic constipation 05/18/2021   Elevated blood pressure reading 03/06/2020   Dysphagia 12/23/2019   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   Pain in left knee 09/24/2019   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   History of subdural hematoma 03/26/2019   Herpes simplex type 1 infection 11/01/2018   History of Guillain-Barre syndrome 04/01/2018   Hyperlipidemia with target LDL less than 70 03/30/2018   Mastalgia, left, followed by GYN 03/29/2018   Fuchs' corneal dystrophy, 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   Insulin resistance 03/29/2018   History of hysterectomy 03/29/2018   Grade I diastolic dysfunction 03/29/2018   Thyroid  nodule 03/27/2018   Presbycusis of both ears 03/10/2017   Dysthymia 09/26/2016   Palpitations 01/17/2013   GERD (gastroesophageal reflux disease)    Restless leg syndrome, on Requip     Allergic rhinitis 06/25/2009   Diaphragmatic hernia 02/16/2009   IBS (irritable bowel syndrome) 02/16/2009   Celiac disease 11/27/2007   Insomnia 07/05/2007   Osteoarthritis 07/05/2007   Osteopenia 10/03/2006   Left carotid bruit 10/03/2006   History of thyroid  cancer 10/03/2006    PCP: Clarisa Crooked, MD  REFERRING PROVIDER: Carylon Claude, FNP  REFERRING DIAG: M54.2 (ICD-10-CM) - Cervicalgia  THERAPY DIAG:  Cervicalgia  Muscle weakness (generalized)  Rationale for Evaluation and Treatment: Rehabilitation  ONSET DATE: a while ago  SUBJECTIVE:  SUBJECTIVE STATEMENT: 09/05/2023 Feeling good, no difficulties with the exercises. States she is doing her exercises. Reports she is 75% better since start of PT.    Eval: Patient has multiple joint complaints but primary complaint at this time is her neck.  Reports she does have occasional dizziness that she does not know if it is related to her neck.  Reports she also has this tingling sensation along her upper back and right pinky and ring finger.  No current tingling sensations noted.  Reports overall she is very active and works out and just needs to wear what to do for her symptoms reports that if she does anything for long period of time her back and neck will hurt.  Reports she has done some stretches but they have not significantly helped.  Reports she also has left knee pain and gets shots to manage her symptoms.     PERTINENT HISTORY:   TMJ pain, CAD,  NSTEMI, hx of thyroid  cancer with thyroidectomy, subdural hematoma with craniotomy 2010, OA, osteopenia  PAIN:  Are you having pain? Yes: NPRS scale: 0/10 Pain location: middle of lower neck B Pain description: tightness Aggravating factors: looking up  Relieving factors: massage, stretch, heat   PRECAUTIONS: None  RED FLAGS: None     WEIGHT BEARING RESTRICTIONS: No  FALLS:  Has patient fallen in last 6 months? No    OCCUPATION: not current working  PLOF: Independent  PATIENT GOALS: to have less pain and knowing what to do for her neck     OBJECTIVE:  Note: Objective measures were completed at Evaluation unless otherwise noted.  DIAGNOSTIC FINDINGS:  No recent imaging  PATIENT SURVEYS:  Patient-specific activity functional scoring scheme (Point to one number):  "0" represents "unable to perform." "10" represents "able to perform at prior level. 0 1 2 3 4 5 6 7 8 9  10 (Date and Score) Activity Initial  Activity Eval   09/05/23  Looking up   2  10  Sleep- getting comfortable   6  10  vacuuming 0 0    Additional Additional Total score = sum of the activity scores/number of activities Minimum detectable change (90%CI) for average score = 2 points Minimum detectable change (90%CI) for single activity score = 3 points PSFS developed by: [place] Spitz., & Binkley, J. (1995). Assessing disability and change on individual  patients: a report of a patient specific measure. Physiotherapy [place], 47, [phone]. Reproduced with the permission of the authors  Score: 8/3= 2.66 4/29= 20/3=6.66  COGNITION: Overall cognitive status: Within functional limits for tasks assessed  SENSATION: Not tested  POSTURE: forward head and posterior pelvic tilt  PALPATION: Tenderness to palpation along B cervical paraspinals and UT   CERVICAL ROM:   Active ROM A/PROM (deg) 4/29  Flexion 30  Extension 20  Right lateral flexion   Left lateral flexion   Right rotation 45  Left rotation 45*   (Blank rows = not tested)  *tension    UE Measurements Upper Extremity Right 4/29 Left 4/29   A/PROM MMT A/PROM MMT  Shoulder Flexion WFL 4 WFL 4  Shoulder Extension      Shoulder Abduction WFL 4 WFL 4  Shoulder Adduction      Shoulder Internal Rotation Rechacs to L2 SP* 4 Reaches to T12 SP 4  Shoulder External Rotation Reaches to C7 SP 4- Reaches T2 SP  4  Elbow Flexion      Elbow Extension  Wrist Flexion      Wrist Extension      Wrist Supination      Wrist Pronation      Wrist Ulnar Deviation      Wrist Radial Deviation      Grip Strength NA  NA     (Blank rows = not tested)   * pain     TREATMENT DATE:                                                                                                                               09/05/2023  Therapeutic Exercise:  Reviewed HEP and answered all questions, objective measures updated, on benefits of weight training with bone density and different planes of motion with strength training     Seated:  Standing: farmers  carry with 35# - x5 laps Neuromuscular Re-education: long exhale breathing/ lateral costal breathing practice with verbal cues for form and posture - 6 minutes Manual Therapy  Therapeutic Activity: Self Care:   Trigger Point Dry Needling:  Modalities:     PATIENT EDUCATION:  Education details: on HEP and rationale for interventions, see above Person educated: Patient Education method: Explanation, Demonstration, and Handouts Education comprehension: verbalized understanding   HOME EXERCISE PROGRAM: [id number] Long exhale breathing Posterior lateral breathing  ASSESSMENT:  CLINICAL IMPRESSION: 09/05/2023 All goals but one met at this time. Reviewed entire HEP and discussed plan moving forward. Answered questions about osteoporosis and strength training as well as importance of addressing multiple planes of motion with all exercises. Patient confident in current presentation and to DC from PT to HEP secondary to progress made while in PT.   Eval: Patient presents to physical therapy with multiple complaints, but primary complaint being neck pain at this time.  Patient presents with range of motion and strength limitations as well as postural limitations that are likely contributing to current pain presentation.  Session focused on education as well as establishing basic home exercise program to help address symptoms and limitations.  Patient would greatly benefit from skilled physical therapy to improve overall physical function and quality of life.  OBJECTIVE IMPAIRMENTS: decreased activity tolerance, decreased ROM, decreased strength, impaired UE functional use, improper body mechanics, postural dysfunction, and pain.   ACTIVITY LIMITATIONS: lifting and reach over head  PARTICIPATION LIMITATIONS: cleaning  PERSONAL FACTORS: Age, Fitness, and Time since onset of injury/illness/exacerbation are also affecting patient's functional outcome.   REHAB POTENTIAL: Good  CLINICAL DECISION  MAKING: Stable/uncomplicated  EVALUATION COMPLEXITY: Low   GOALS: Goals reviewed with patient? yes  SHORT TERM GOALS: Target date: 09/07/2023   Patient will be independent in self management strategies to improve quality of life and functional outcomes. Baseline: New Program Goal status: MET  2.  Patient will report at least 50% improvement in overall symptoms and/or function to demonstrate improved functional mobility Baseline: 0% better Goal status: MET  3.  Patient will demonstrate pain-free neck range of motion in all  directions Baseline: Painful Goal status: PROGRESSING      LONG TERM GOALS: Target date: 10/18/23  Patient will report at least 75% improvement in overall symptoms and/or function to demonstrate improved functional mobility Baseline: 0% better Goal status: MET  2.  Patient will score at least 2 points higher on PSFS average to demonstrate change in overall function. Baseline: see above Goal status: MET  3.  Patient will demonstrate pain-free manual muscle testing in upper extremities Baseline: Painful Goal status: MET     PLAN:  PT FREQUENCY: 1x/week  PT DURATION: 12 weeks  PLANNED INTERVENTIONS: 97110-Therapeutic exercises, 97530- Therapeutic activity, 97112- Neuromuscular re-education, 97535- Self Care, 24401- Manual therapy, [phone]- Gait training, [phone]- Orthotic Fit/training, [phone]- Canalith repositioning, [id number]- Aquatic Therapy, [id number]- Electrical stimulation (unattended), [phone]- Ionotophoresis 4mg /ml Dexamethasone , Patient/Family education, Balance training, Stair training, Taping, Dry Needling, Joint mobilization, Joint manipulation, Spinal manipulation, Spinal mobilization, Cryotherapy, and Moist heat   PLAN FOR NEXT SESSION:DC to HEP    1:50 PM, 09/05/23 [name] Ewings, DPT Physical Therapy with [facility]

## 2023-09-11 DIAGNOSIS — M545 Low back pain, unspecified: Secondary | ICD-10-CM | POA: Diagnosis not present

## 2023-09-11 DIAGNOSIS — M542 Cervicalgia: Secondary | ICD-10-CM | POA: Diagnosis not present

## 2023-09-11 DIAGNOSIS — M51369 Other intervertebral disc degeneration, lumbar region without mention of lumbar back pain or lower extremity pain: Secondary | ICD-10-CM | POA: Diagnosis not present

## 2023-09-11 DIAGNOSIS — M25512 Pain in left shoulder: Secondary | ICD-10-CM | POA: Diagnosis not present

## 2023-09-11 DIAGNOSIS — M25511 Pain in right shoulder: Secondary | ICD-10-CM | POA: Diagnosis not present

## 2023-09-13 ENCOUNTER — Ambulatory Visit
Admission: RE | Admit: 2023-09-13 | Discharge: 2023-09-13 | Disposition: A | Discharge status: Home / Self Care | Source: Ambulatory Visit | Attending: Obstetrics and Gynecology

## 2023-09-13 DIAGNOSIS — N644 Mastodynia: Secondary | ICD-10-CM

## 2023-09-14 ENCOUNTER — Encounter: Payer: Self-pay | Admitting: Obstetrics and Gynecology

## 2023-09-19 ENCOUNTER — Encounter: Payer: Self-pay | Admitting: Family Medicine

## 2023-09-19 ENCOUNTER — Ambulatory Visit (INDEPENDENT_AMBULATORY_CARE_PROVIDER_SITE_OTHER): Admitting: Family Medicine

## 2023-09-19 ENCOUNTER — Encounter: Payer: Self-pay | Admitting: Gastroenterology

## 2023-09-19 VITALS — BP 120/80 | HR 73 | Temp 97.3°F | Ht 65.0 in | Wt 154.0 lb

## 2023-09-19 DIAGNOSIS — M79651 Pain in right thigh: Secondary | ICD-10-CM

## 2023-09-19 DIAGNOSIS — I10 Essential (primary) hypertension: Secondary | ICD-10-CM

## 2023-09-19 NOTE — Patient Instructions (Addendum)
 Thanks for doing ultrasound tomorrow so we can evaluate this further.   Recommended follow up: Return for as needed for new, worsening, persistent symptoms.

## 2023-09-19 NOTE — Progress Notes (Signed)
 Phone [phone] In person visit   Subjective:   Amanda [name] is a 73 y.o. year old very pleasant female patient who presents for/with See problem oriented charting Chief Complaint  Patient presents with   lumps on thigh    Pt c/o lumps on the side of right thigh that she noticed a week ago.   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    Priority: High   History of Guillain-Barre syndrome 04/01/2018    Priority: High   Thyroid  nodule 03/27/2018    Priority: High   Celiac disease 11/27/2007    Priority: High   Insomnia 07/05/2007    Priority: High   History of thyroid  cancer 10/03/2006    Priority: High   Essential hypertension 02/28/2022    Priority: Medium    Dysphagia 12/23/2019    Priority: Medium    History of subdural hematoma 03/26/2019    Priority: Medium    Herpes simplex type 1 infection 11/01/2018    Priority: Medium    Hyperlipidemia with target LDL less than 70 03/30/2018    Priority: Medium    Fuchs' corneal dystrophy, 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    Priority: Medium    Insulin resistance 03/29/2018    Priority: Medium    Grade I diastolic dysfunction 03/29/2018    Priority: Medium    Dysthymia 09/26/2016    Priority: Medium    Palpitations 01/17/2013    Priority: Medium    GERD (gastroesophageal reflux disease)     Priority: Medium    Restless leg syndrome, on Requip      Priority: Medium    IBS (irritable bowel syndrome) 02/16/2009    Priority: Medium    Osteoarthritis 07/05/2007    Priority: Medium    Osteopenia 10/03/2006    Priority: Medium    Elevated blood pressure reading 03/06/2020    Priority: Low   Mastalgia, left, followed by GYN 03/29/2018    Priority: Low   History of hysterectomy 03/29/2018    Priority: Low   Presbycusis of both ears 03/10/2017    Priority: Low   Allergic rhinitis 06/25/2009    Priority: Low   Diaphragmatic  hernia 02/16/2009    Priority: Low   Left carotid bruit 10/03/2006    Priority: Low   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   Pain in left knee 09/24/2019    Priority: 1.   Coronary artery disease, non-occlusive 06/06/2023   Overweight 03/02/2023   Polyarthralgia 03/02/2023   ACS (acute coronary syndrome) (HCC) 02/02/2023   NSTEMI (non-ST elevated myocardial infarction) (HCC) 02/02/2023   Degeneration of lumbar intervertebral disc 09/07/2022   Deviated nasal septum 07/29/2022   Pain in right hand 04/26/2022   Hoarseness 12/02/2021   Chronic constipation 05/18/2021    Medications- reviewed and updated Current Outpatient Medications  Medication Sig Dispense Refill   APPLE CIDER VINEGAR PO Take 1 tablet by mouth in the morning, at noon, and at bedtime.     aspirin  EC 81 MG tablet Take 1 tablet (81 mg total) by mouth daily. Swallow whole. 30 tablet 3   bisacodyl (DULCOLAX) 5 MG EC tablet Take 5 mg by mouth daily as needed for moderate constipation.     CALCIUM  PO Take 2 tablets by mouth daily.     CO ENZYME Q-10 PO Take 1 tablet by mouth daily.     Cyanocobalamin  (VITAMIN B-12 ER  PO) Take 1 tablet by mouth daily.     escitalopram  (LEXAPRO ) 10 MG tablet TAKE 1 TABLET BY MOUTH EVERYDAY AT BEDTIME 90 tablet 2   MAGNESIUM PO Take 1 tablet by mouth at bedtime.     Melatonin 3 MG CAPS Take 6 mg by mouth at bedtime.     metoprolol  succinate (TOPROL -XL) 25 MG 24 hr tablet TAKE 1 TABLET BY MOUTH EVERY DAY WITH OR IMMEDIATELY FOLLOWING A MEAL 90 tablet 3   OVER THE COUNTER MEDICATION Take 1 tablet by mouth at bedtime. Renew Life Cleansemore     OVER THE COUNTER MEDICATION Take 1 tablet by mouth daily. Life extension     Polyethyl Glycol-Propyl Glycol (SYSTANE OP) Place 2 drops into both eyes as needed (dryness).     rOPINIRole  (REQUIP ) 0.5 MG tablet Take 1 tablet (0.5 mg total) by mouth at bedtime. 90 tablet 3   Turmeric (QC TUMERIC COMPLEX PO) Take 1 capsule by mouth daily.      valACYclovir  (VALTREX ) 500 MG tablet ONE TAB TWICE A DAY AS NEEDED FOR OUTBREAK 180 tablet 1   Vitamin D -Vitamin K (VITAMIN K2-VITAMIN D3 PO) Take 1 capsule by mouth daily.     clonazePAM  (KLONOPIN ) 0.5 MG tablet Take 1 tablet (0.5 mg total) by mouth at bedtime as needed for anxiety (sleep). (Patient not taking: Reported on 09/19/2023) 90 tablet 1   No current facility-administered medications for this visit.     Objective:  BP 120/80   Pulse 73   Temp (!) 97.3 F (36.3 C)   Ht 5\' 5"  (1.651 m)   Wt 154 lb (69.9 kg)   LMP  (LMP Unknown)   SpO2 97%   BMI 25.63 kg/m  Gen: NAD, resting comfortably CV: RRR no murmurs rubs or gallops Lungs: CTAB no crackles, wheeze, rhonchi Abdomen: soft/nontender/nondistended/normal bowel sounds. No rebound or guarding.  Ext: no edema Skin: warm, dry, some bruising along right shin-3 x 3 mm area tender underneath this On right lateral thigh there are 3 subcutaneous 5 x 5 mm areas that are tender to touch with slight induration Neuro: grossly normal, moves all extremities    Assessment and Plan   # Lumps on right thigh S: Patient noted lumps on the right side of her thigh about a week ago-no pain without palpation and no surrounding redness or associated rash . Not tender unless its palpated. Still able to do normal activities without difficulty including walking in park and going to [facility] today. Actually on exam has 3 spots - not directly adjacent that are similar about 5 x 5 mm or less.  A/P: Unclear what these are 3 msall areas 5 x 5 mm that are tender with palpation but otherwise generally not bothersome- neurofibroma? Small cyst? Patient has friend with lymphoma diagnosed with a lesion on thigh and would like furhter workup- we opted to start with ultrasound . Recent bloodwork within 2 months and we did not feel need to repeat   #Hit shin 1.5 weeks ago in an area that she had scraped off about a year ago- bruising and small nodular area -  discussed hematoma vs simple bruising or subcutaneous adipose tissue irritation  #hypertension with white coat element-new diagnosis 02/28/2022 S: medication: metoprolol   25mg  extended release (on at baseline for palpitations) A/P: well controlled today - continue current medications   Recommended follow up: Return for as needed for new, worsening, persistent symptoms. Future Appointments  Date Time Provider Department Center  09/20/2023 10:45 AM [facility] US  1 [facility]  10/30/2023 10:30 AM [facility] PREVISIT RM 50 [facility]  11/14/2023  7:30 AM Armbruster, Lendon Queen, MD [facility]    Lab/Order associations:   ICD-10-CM   1. Right thigh pain  M79.651 US  SOFT TISSUE LOWER EXTREMITY LIMITED RIGHT (NON-VASCULAR)    2. Essential hypertension  I10       No orders of the defined types were placed in this encounter.   Return precautions advised.  Clarisa Crooked, MD

## 2023-09-20 ENCOUNTER — Ambulatory Visit
Admission: RE | Admit: 2023-09-20 | Discharge: 2023-09-20 | Discharge status: Home / Self Care | Source: Ambulatory Visit | Attending: Family Medicine | Admitting: Family Medicine

## 2023-09-20 DIAGNOSIS — M79651 Pain in right thigh: Secondary | ICD-10-CM

## 2023-09-20 DIAGNOSIS — R2241 Localized swelling, mass and lump, right lower limb: Secondary | ICD-10-CM | POA: Diagnosis not present

## 2023-09-25 ENCOUNTER — Other Ambulatory Visit: Payer: Self-pay | Admitting: Family Medicine

## 2023-09-26 ENCOUNTER — Ambulatory Visit: Payer: Self-pay | Admitting: Family Medicine

## 2023-09-26 DIAGNOSIS — R9389 Abnormal findings on diagnostic imaging of other specified body structures: Secondary | ICD-10-CM

## 2023-09-26 DIAGNOSIS — M79651 Pain in right thigh: Secondary | ICD-10-CM

## 2023-09-27 ENCOUNTER — Telehealth: Payer: Self-pay

## 2023-09-27 NOTE — Telephone Encounter (Signed)
 Called and lm on pt vm, we sent in #90 with 1 refill on 07/25/23, therefore pt should have remaining refills at the pharmacy. Please relay message when pt calls back.  Copied from CRM [id number]. Topic: Clinical - Medication Question >> Sep 26, 2023  4:22 PM Luane Rumps D wrote: Reason for CRM: Patient confirming status of refill on clonazePAM  (KLONOPIN ) 0.5 MG tablet, patient said she goes out of town on 5/23 and is worried it won't be filled in time as she is unable to sleep without it. Notified patient of 3 day window but advised her I would leave a message for her concern.

## 2023-09-29 NOTE — Telephone Encounter (Signed)
 Called and spoke with Bambi Lever at [facility] and pt picked this up on 09/27/23.

## 2023-10-07 DIAGNOSIS — R42 Dizziness and giddiness: Secondary | ICD-10-CM | POA: Diagnosis not present

## 2023-10-07 DIAGNOSIS — R9431 Abnormal electrocardiogram [ECG] [EKG]: Secondary | ICD-10-CM | POA: Diagnosis not present

## 2023-10-10 ENCOUNTER — Other Ambulatory Visit

## 2023-10-13 ENCOUNTER — Other Ambulatory Visit: Payer: Self-pay

## 2023-10-19 ENCOUNTER — Ambulatory Visit: Payer: Self-pay | Admitting: Family Medicine

## 2023-10-19 ENCOUNTER — Ambulatory Visit
Admission: RE | Admit: 2023-10-19 | Discharge: 2023-10-19 | Disposition: A | Discharge status: Home / Self Care | Source: Ambulatory Visit | Attending: Family Medicine | Admitting: Family Medicine

## 2023-10-19 DIAGNOSIS — M79651 Pain in right thigh: Secondary | ICD-10-CM | POA: Diagnosis not present

## 2023-10-19 DIAGNOSIS — R9389 Abnormal findings on diagnostic imaging of other specified body structures: Secondary | ICD-10-CM

## 2023-10-19 MED ORDER — GADOPICLENOL 0.5 MMOL/ML IV SOLN
7.0000 mL | Freq: Once | INTRAVENOUS | Status: AC | PRN
  Administered 2023-10-19: 7 mL via INTRAVENOUS

## 2023-10-26 ENCOUNTER — Telehealth: Payer: Self-pay | Admitting: *Deleted

## 2023-10-26 DIAGNOSIS — G2581 Restless legs syndrome: Secondary | ICD-10-CM

## 2023-10-26 NOTE — Telephone Encounter (Signed)
 I tried to look back in the conversation-when did we discuss increasing the dosage?  Did someone else tell her that?

## 2023-10-26 NOTE — Telephone Encounter (Signed)
 Copied from CRM [id number]. Topic: Clinical - Medication Question >> Oct 26, 2023  2:48 PM Adonis Hoot wrote: Reason for CRM: Patient called in stating that she was told to double up on medication rOPINIRole  (REQUIP ) 0.5 MG tablet by provider.So now she is down to 4 pills and pharmacy told her that she can't get refills until July. She would like to know if a new prescription refecting this change could be sent to pharmacy for her so that she doesn't run out.  [id number] [place], [place] - [address]  Phone: [phone] Fax: [phone]

## 2023-10-30 ENCOUNTER — Ambulatory Visit (AMBULATORY_SURGERY_CENTER)

## 2023-10-30 VITALS — Ht 65.0 in | Wt 147.0 lb

## 2023-10-30 DIAGNOSIS — Z860100 Personal history of colon polyps, unspecified: Secondary | ICD-10-CM

## 2023-10-30 MED ORDER — ROPINIROLE HCL 0.5 MG PO TABS: 0.5000 mg | ORAL_TABLET | Freq: Every day | ORAL | 3 refills | Status: DC

## 2023-10-30 MED ORDER — NA SULFATE-K SULFATE-MG SULF 17.5-3.13-1.6 GM/177ML PO SOLN: 1.0000 | Freq: Once | ORAL | 0 refills | Status: AC

## 2023-10-30 NOTE — Progress Notes (Signed)
 No egg or soy allergy known to patient  No issues known to pt with past sedation with any surgeries or procedures Patient denies ever being told they had issues or difficulty with intubation  No FH of Malignant Hyperthermia Pt is not on diet pills Pt is not on  home 02  Pt is not on blood thinners  Pt has issues with constipation and takes Cleanse more and Dulcolax and Enema No A fib or A flutter Have any cardiac testing pending--no Pt can ambulate  Pt denies use of chewing tobacco Discussed diabetic I weight loss medication holds Discussed NSAID holds Checked BMI Pt instructed to use [employer] or GoodRx for a price reduction on prep  Patient's chart reviewed by Norleen Schillings CNRA prior to previsit and patient appropriate for the [facility].  Pre visit completed and red dot placed by patient's name on their procedure day (on provider's schedule).

## 2023-10-30 NOTE — Telephone Encounter (Signed)
 Pt called back in and is referring to conversation on 07/25/23 through MyChart. Please advise

## 2023-10-30 NOTE — Telephone Encounter (Signed)
 I have sent this in for her at the higher dose

## 2023-10-30 NOTE — Addendum Note (Signed)
 Addended by: KATRINKA GARNETTE KIDD on: 10/30/2023 04:18 PM   Modules accepted: Orders

## 2023-10-31 ENCOUNTER — Telehealth: Payer: Self-pay

## 2023-10-31 NOTE — Telephone Encounter (Signed)
 Copied from CRM [id number]. Topic: Clinical - Medication Question >> Oct 30, 2023 11:08 AM Aleatha C wrote: Reason for CRM: patient stated she spoke to Dr Katrinka about rOPINIRole  (REQUIP ) 0.5 MG tablet increase in March, and she has been having restless sleep and would like a increase and for someone to get back to her about it

## 2023-10-31 NOTE — Telephone Encounter (Signed)
 Left pt a vm to return my call to verify which dose of requip  she is currently taking.

## 2023-10-31 NOTE — Telephone Encounter (Signed)
 Spoke with pt and she said the dose that was sent in yesterday was correct. No further action needed

## 2023-10-31 NOTE — Telephone Encounter (Signed)
 She had gone down to a lower dose of 0.25 mg and I made the statement Yes you could take 2 if it is not effective but let me know so I can send in the higher dose of 0.5 mg if needed  on March MyChart message stream.  When I was talking about taking 2 tablets that was 2 tablets of 0.25 mg which would equal to 0.5 mg which I sent in yesterday.  There was another phone note about this recently and does not appear 100 team ever called her.  Someone please call patient directly and verify she is talk about 0.5 mg dose and not requesting 1 mg dose-it is unclear from the history fully what she is requesting

## 2023-11-14 ENCOUNTER — Encounter: Payer: Self-pay | Admitting: Gastroenterology

## 2023-11-14 ENCOUNTER — Ambulatory Visit: Admitting: Gastroenterology

## 2023-11-14 VITALS — BP 126/64 | HR 57 | Temp 97.7°F | Resp 13 | Ht 65.0 in | Wt 147.0 lb

## 2023-11-14 DIAGNOSIS — Z1211 Encounter for screening for malignant neoplasm of colon: Secondary | ICD-10-CM

## 2023-11-14 DIAGNOSIS — D124 Benign neoplasm of descending colon: Secondary | ICD-10-CM | POA: Diagnosis not present

## 2023-11-14 DIAGNOSIS — K644 Residual hemorrhoidal skin tags: Secondary | ICD-10-CM | POA: Diagnosis not present

## 2023-11-14 DIAGNOSIS — E785 Hyperlipidemia, unspecified: Secondary | ICD-10-CM | POA: Diagnosis not present

## 2023-11-14 DIAGNOSIS — D125 Benign neoplasm of sigmoid colon: Secondary | ICD-10-CM | POA: Diagnosis not present

## 2023-11-14 DIAGNOSIS — I10 Essential (primary) hypertension: Secondary | ICD-10-CM | POA: Diagnosis not present

## 2023-11-14 DIAGNOSIS — Z860101 Personal history of adenomatous and serrated colon polyps: Secondary | ICD-10-CM | POA: Diagnosis not present

## 2023-11-14 DIAGNOSIS — Q399 Congenital malformation of esophagus, unspecified: Secondary | ICD-10-CM | POA: Diagnosis not present

## 2023-11-14 DIAGNOSIS — D122 Benign neoplasm of ascending colon: Secondary | ICD-10-CM | POA: Diagnosis not present

## 2023-11-14 DIAGNOSIS — K6389 Other specified diseases of intestine: Secondary | ICD-10-CM | POA: Diagnosis not present

## 2023-11-14 DIAGNOSIS — K648 Other hemorrhoids: Secondary | ICD-10-CM

## 2023-11-14 DIAGNOSIS — K635 Polyp of colon: Secondary | ICD-10-CM | POA: Diagnosis not present

## 2023-11-14 DIAGNOSIS — Z860100 Personal history of colon polyps, unspecified: Secondary | ICD-10-CM

## 2023-11-14 DIAGNOSIS — F419 Anxiety disorder, unspecified: Secondary | ICD-10-CM | POA: Diagnosis not present

## 2023-11-14 MED ORDER — SODIUM CHLORIDE 0.9 % IV SOLN: 500.0000 mL | Freq: Once | INTRAVENOUS | Status: DC

## 2023-11-14 NOTE — Progress Notes (Signed)
 Upper Endoscopy w/ dilatation to be scheduled -patient wanted to look @ calendar at home and call back to schedule;therefore, was  not scheduled today

## 2023-11-14 NOTE — Progress Notes (Signed)
 Called to room to assist during endoscopic procedure.  Patient ID and intended procedure confirmed with present staff. Received instructions for my participation in the procedure from the performing physician.

## 2023-11-14 NOTE — Progress Notes (Signed)
 Report to PACU, RN, vss, BBS= Clear.

## 2023-11-14 NOTE — Progress Notes (Signed)
 [facility] History and Physical   Primary Care Physician:  Katrinka Garnette KIDD, MD   Reason for Procedure:   History of colon polyps  Plan:    colonoscopy     HPI: Amanda [name] is a 73 y.o. female  here for colonoscopy - last exam 11/2018 - cecal SSP.  Has some baseline constipation for which she uses herbal treatments.  No family history of colon cancer known.   Otherwise feels well without any cardiopulmonary symptoms.   I have discussed risks / benefits of anesthesia and endoscopic procedure with Amanda [name] and they wish to proceed with the exams as outlined today.    Past Medical History:  Diagnosis Date   Anemia    Anxiety    Celiac disease    Chronic constipation    Coronary artery spasm (HCC)    COVID 02/12/2021   COVID-19 virus vaccine not available    Erythema migrans (Lyme disease) 12/17/2021   GERD (gastroesophageal reflux disease)    occasionally (per pt takes apple cider vinager)   Heart palpitations    cardiologist-- dr anner--- event monitor 08-24-2011 epic ;  nuclear stress test 03-16-2011 (epic) normal w/ no ischemia, ef 65%;  echo 01-18-2013  ef 60-65%, G1DD   Hiatal hernia    History of subdural hematoma    12/ 23/ 2010  s/p  craniotomy w/ hematoma evacuation (pt had a fall w/ concussion)  per pt no residual   History of thyroid  cancer followed by dr jesus---   s/p  left thyroidectomy --- per pt no radiation and no recurrence   Hyperlipidemia    Hypertension    IBS (irritable bowel syndrome)    Immunization, single disease 06/09/2019   1st dose moderma vaccine administered   Insomnia    OA (osteoarthritis)    Osteopenia, T score -2.1 FRAX 9.4%/1.3% stable from prior DEXA 06/2015   Restless leg syndrome    Right thyroid  nodule    followed by dr jesus--- last ultrasound in epic 03-30-2018 stable , to bx   STD (sexually transmitted disease), HSV    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 Normal EF.AB::   REDUCTION MAMMAPLASTY Bilateral    SUBDURAL HEMATOMA EVACUATION VIA CRANIOTOMY  04-30-2009  @[facility]    left frontotemporapartietal    THYROID  LOBECTOMY Left 05-04-2001  @[facility]   dr jesus   TONSILLECTOMY     TRANSTHORACIC ECHOCARDIOGRAM  02/03/2023   EF 60 to 65%.  Normal function.  No RWMA.  GR 1 DD normal RV.  Normal PAP.  Normal valves.  Normal pressures.   VULVECTOMY N/A 03/12/2019   Procedure: WIDE LOCAL EXCISION OF CARCINOMA IS SITU OF VULVA;  Surgeon: Rockney Evalene SQUIBB, MD;  Location: [facility];  Service: Gynecology;  Laterality: N/A;  request to follow in [facility] block time requests one hour colposcope available in OR with acetic acid .   VULVECTOMY N/A 06/17/2019  Procedure: WIDE LOCAL  EXCISION VULVECTOMY;  Surgeon: Eloy Herring, MD;  Location: [facility];  Service: Gynecology;  Laterality: N/A;    Prior to Admission medications   Medication Sig Start Date End Date Taking? Authorizing Provider  APPLE CIDER VINEGAR PO Take 1 tablet by mouth in the morning, at noon, and at bedtime.   Yes [provider]  calcium  carbonate (CALCIUM  600) 1500 (600 Ca) MG TABS tablet 1 tablet with food Orally Twice a day   Yes [provider]  CALCIUM  PO Take 2 tablets by mouth daily.   Yes [provider]  clonazePAM  (KLONOPIN ) 0.5 MG tablet Take 1 tablet (0.5 mg total) by mouth at bedtime as needed for  anxiety (sleep). 07/25/23  Yes Katrinka Garnette KIDD, MD  CO ENZYME Q-10 PO Take 1 tablet by mouth daily.   Yes [provider]  Cyanocobalamin  (VITAMIN B-12 ER PO) Take 1 tablet by mouth daily.   Yes [provider]  escitalopram  (LEXAPRO ) 10 MG tablet TAKE 1 TABLET BY MOUTH EVERYDAY AT BEDTIME 05/18/23  Yes Katrinka Garnette KIDD, MD  MAGNESIUM PO Take 1 tablet by mouth at bedtime.   Yes [provider]  metoprolol  succinate (TOPROL -XL) 25 MG 24 hr tablet TAKE 1 TABLET BY MOUTH EVERY DAY WITH OR IMMEDIATELY FOLLOWING A MEAL 06/12/23  Yes Anner Alm ORN, MD  Omega 3 1000 MG CAPS 1 capsule.   Yes [provider]  Polyethyl Glycol-Propyl Glycol (SYSTANE OP) Place 2 drops into both eyes as needed (dryness).   Yes [provider]  rOPINIRole  (REQUIP ) 0.5 MG tablet Take 1 tablet (0.5 mg total) by mouth at bedtime. 10/30/23  Yes Katrinka Garnette KIDD, MD  Turmeric (QC TUMERIC COMPLEX PO) Take 1 capsule by mouth daily.   Yes [provider]  Vitamin D -Vitamin K (VITAMIN K2-VITAMIN D3 PO) Take 1 capsule by mouth daily.   Yes [provider]  aspirin  EC 81 MG tablet Take 1 tablet (81 mg total) by mouth daily. Swallow whole. 02/04/23   Zhao, Xika, NP  bisacodyl (DULCOLAX) 5 MG EC tablet Take 5 mg by mouth daily as needed for moderate constipation.    [provider]  Melatonin 3 MG CAPS Take 6 mg by mouth at bedtime.    [provider]  meloxicam (MOBIC) 15 MG tablet TAKE 1 TABLET A DAY FOR PAIN AND INFLAMMATION 06/12/23   [provider]  OVER THE COUNTER MEDICATION Take 1 tablet by mouth at bedtime. Renew Life [place]    [provider]  OVER THE COUNTER MEDICATION Take 1 tablet by mouth daily. Life extension    [provider]  rosuvastatin  (CRESTOR ) 40 MG tablet Take 40 mg by mouth daily. Patient not taking: Reported on 11/14/2023 08/07/23   [provider]  valACYclovir  (VALTREX ) 500 MG tablet ONE TAB  TWICE A DAY AS NEEDED FOR OUTBREAK 03/30/23   Katrinka Garnette KIDD, MD    Current Outpatient Medications  Medication Sig Dispense Refill   APPLE CIDER VINEGAR PO Take 1 tablet by mouth in the morning, at noon, and at bedtime.     calcium  carbonate (CALCIUM  600) 1500 (600 Ca) MG TABS tablet 1 tablet with food Orally Twice a day     CALCIUM  PO Take 2 tablets by mouth daily.     clonazePAM  (KLONOPIN ) 0.5 MG tablet Take 1 tablet (0.5 mg total) by mouth at bedtime as needed for anxiety (sleep). 90 tablet 1   CO ENZYME Q-10  PO Take 1 tablet by mouth daily.     Cyanocobalamin  (VITAMIN B-12 ER PO) Take 1 tablet by mouth daily.     escitalopram  (LEXAPRO ) 10 MG tablet TAKE 1 TABLET BY MOUTH EVERYDAY AT BEDTIME 90 tablet 2   MAGNESIUM PO Take 1 tablet by mouth at bedtime.     metoprolol  succinate (TOPROL -XL) 25 MG 24 hr tablet TAKE 1 TABLET BY MOUTH EVERY DAY WITH OR IMMEDIATELY FOLLOWING A MEAL 90 tablet 3   Omega 3 1000 MG CAPS 1 capsule.     Polyethyl Glycol-Propyl Glycol (SYSTANE OP) Place 2 drops into both eyes as needed (dryness).     rOPINIRole  (REQUIP ) 0.5 MG tablet Take 1 tablet (0.5 mg total) by mouth at bedtime. 90 tablet 3   Turmeric (QC TUMERIC COMPLEX PO) Take 1 capsule by mouth daily.     Vitamin D -Vitamin K (VITAMIN K2-VITAMIN D3 PO) Take 1 capsule by mouth daily.     aspirin  EC 81 MG tablet Take 1 tablet (81 mg total) by mouth daily. Swallow whole. 30 tablet 3   bisacodyl (DULCOLAX) 5 MG EC tablet Take 5 mg by mouth daily as needed for moderate constipation.     Melatonin 3 MG CAPS Take 6 mg by mouth at bedtime.     meloxicam (MOBIC) 15 MG tablet TAKE 1 TABLET A DAY FOR PAIN AND INFLAMMATION     OVER THE COUNTER MEDICATION Take 1 tablet by mouth at bedtime. Renew Life [place]     OVER THE COUNTER MEDICATION Take 1 tablet by mouth daily. Life extension     rosuvastatin  (CRESTOR ) 40 MG tablet Take 40 mg by mouth daily. (Patient not taking: Reported on 11/14/2023)     valACYclovir   (VALTREX ) 500 MG tablet ONE TAB TWICE A DAY AS NEEDED FOR OUTBREAK 180 tablet 1   Current Facility-Administered Medications  Medication Dose Route Frequency Provider Last Rate Last Admin   0.9 %  sodium chloride  infusion  500 mL Intravenous Once Miraya Cudney, Elspeth SQUIBB, MD        Allergies as of 11/14/2023 - Review Complete 11/14/2023  Allergen Reaction Noted   Ambien [zolpidem tartrate] Other (See Comments) 10/16/2012   Cortisone Other (See Comments) 07/22/2009   Eszopiclone and related Other (See Comments) 01/17/2013   Levofloxacin Other (See Comments) 12/27/2022   Zolpidem Other (See Comments) 10/16/2012   Dicloxacillin Other (See Comments) 02/02/2011   Dilantin [phenytoin sodium extended] Hives and Itching 02/16/2012   Erythromycin Other (See Comments) 10/03/2006   Amlodipine  Other (See Comments) 02/23/2023   Avelox  [moxifloxacin  hcl in nacl] Other (See Comments) 09/26/2013   Dicloxacillin sodium Other (See Comments) 10/30/2023   Eszopiclone Other (See Comments) 12/27/2022   Gluten meal Other (See Comments) 02/03/2023   Influenza virus vaccine Other (See Comments) 12/27/2022   Isosorbide  Other (See Comments) 02/06/2023   Loratadine  Other (See Comments) 02/06/2023   Moxifloxacin  Other (See Comments) 12/27/2022   Nefazodone Other (See Comments) 12/27/2022   Nitrofuran derivatives Other (See Comments) 10/16/2012   Other Other (See Comments) 01/21/2015   Phenytoin Other (See Comments) 12/27/2022   Phenytoin sodium extended Rash 02/02/2011   Trazodone Other (See Comments) 12/27/2022   Trazodone and nefazodone Other (See Comments) 10/16/2012    Family History  Problem Relation Age of Onset   Failure to thrive Mother        31   Liver cancer Father        alcohol related likely   Hypertension Father    Diabetes Maternal Aunt  Breast cancer Maternal Aunt 60   Heart disease Maternal Grandmother    Heart disease Maternal Grandfather    Hyperlipidemia Maternal Grandfather     Hypertension Paternal Grandfather    Colon cancer Neg Hx    Thyroid  disease Neg Hx    Colon polyps Neg Hx    Esophageal cancer Neg Hx    Rectal cancer Neg Hx    Stomach cancer Neg Hx     Social History   Socioeconomic History   Marital status: Married    Spouse name: Not on file   Number of children: 2   Years of education: Not on file   Highest education level: Not on file  Occupational History   Occupation: retired  Tobacco Use   Smoking status: Never   Smokeless tobacco: Never  Vaping Use   Vaping status: Never Used  Substance and Sexual Activity   Alcohol use: Yes    Comment: once a week   Drug use: Never   Sexual activity: Not Currently    Partners: Male    Birth control/protection: Surgical    Comment: HYST-1st intercourse 73 yo-Fewer than 5 partners  Other Topics Concern   Not on file  Social History Narrative   Married mother of 66 with 51 year old granddaughter (born in 2019) and another due in 2022. Her son Bernardino is patient of Dr. Katrinka       Lives with husband in a three story home, no issue with stairs.    Highest level of education is an associates.       She exercises regularly, leads an active lifestyle, has a healthy diet, and is very socially engaged. Self-described HSP: highly sensitive person and therefore tends to worry more about symptoms than others might.      Hobbies: enjoys time with friends, time on front porch with friends during covid, park daily, family time   Social Drivers of Health   Financial Resource Strain: Low Risk  (09/19/2023)   Overall Financial Resource Strain (CARDIA)    Difficulty of Paying Living Expenses: Not hard at all  Food Insecurity: No Food Insecurity (09/19/2023)   Hunger Vital Sign    Worried About Running Out of Food in the Last Year: Never true    Ran Out of Food in the Last Year: Never true  Transportation Needs: No Transportation Needs (09/19/2023)   PRAPARE - Administrator, Civil Service  (Medical): No    Lack of Transportation (Non-Medical): No  Physical Activity: Sufficiently Active (09/19/2023)   Exercise Vital Sign    Days of Exercise per Week: 5 days    Minutes of Exercise per Session: 40 min  Stress: No Stress Concern Present (09/19/2023)   [employer] of Occupational Health - Occupational Stress Questionnaire    Feeling of Stress : Only a little  Social Connections: Unknown (09/19/2023)   Social Connection and Isolation Panel    Frequency of Communication with Friends and Family: Not on file    Frequency of Social Gatherings with Friends and Family: Three times a week    Attends Religious Services: Not on file    Active Member of Clubs or Organizations: Not on file    Attends Banker Meetings: Not on file    Marital Status: Married  Intimate Partner Violence: Not on file    Review of Systems: All other review of systems negative except as mentioned in the HPI.  Physical Exam: Vital signs BP 123/81   Pulse  71   Temp 97.7 F (36.5 C) (Temporal)   Ht 5' 5 (1.651 m)   Wt 147 lb (66.7 kg)   LMP  (LMP Unknown)   SpO2 97%   BMI 24.46 kg/m   General:   Alert,  Well-developed, pleasant and cooperative in NAD Lungs:  Clear throughout to auscultation.   Heart:  Regular rate and rhythm Abdomen:  Soft, nontender and nondistended.   Neuro/Psych:  Alert and cooperative. Normal mood and affect. A and O x 3  Marcey Naval, MD [facility]

## 2023-11-14 NOTE — Progress Notes (Signed)
 Pt's states no medical or surgical changes since previsit or office visit.

## 2023-11-14 NOTE — Patient Instructions (Signed)
   Handouts on polyps & hemorrhoids given to you today.   Await pathology results on polyps removed  Upper Endoscopy with dilatation to be scheduled- patient will call back to schedule when she can look at her calendar at home    YOU HAD AN ENDOSCOPIC PROCEDURE TODAY AT THE  ENDOSCOPY CENTER:   Refer to the procedure report that was given to you for any specific questions about what was found during the examination.  If the procedure report does not answer your questions, please call your gastroenterologist to clarify.  If you requested that your care partner not be given the details of your procedure findings, then the procedure report has been included in a sealed envelope for you to review at your convenience later.  YOU SHOULD EXPECT: Some feelings of bloating in the abdomen. Passage of more gas than usual.  Walking can help get rid of the air that was put into your GI tract during the procedure and reduce the bloating. If you had a lower endoscopy (such as a colonoscopy or flexible sigmoidoscopy) you may notice spotting of blood in your stool or on the toilet paper. If you underwent a bowel prep for your procedure, you may not have a normal bowel movement for a few days.  Please Note:  You might notice some irritation and congestion in your nose or some drainage.  This is from the oxygen used during your procedure.  There is no need for concern and it should clear up in a day or so.  SYMPTOMS TO REPORT IMMEDIATELY:  Following lower endoscopy (colonoscopy or flexible sigmoidoscopy):  Excessive amounts of blood in the stool  Significant tenderness or worsening of abdominal pains  Swelling of the abdomen that is new, acute  Fever of 100F or higher   For urgent or emergent issues, a gastroenterologist can be reached at any hour by calling [phone]. Do not use MyChart messaging for urgent concerns.    DIET:  We do recommend a small meal at first, but then you may proceed to  your regular diet.  Drink plenty of fluids but you should avoid alcoholic beverages for 24 hours.  ACTIVITY:  You should plan to take it easy for the rest of today and you should NOT DRIVE or use heavy machinery until tomorrow (because of the sedation medicines used during the test).    FOLLOW UP: Our staff will call the number listed on your records the next business day following your procedure.  We will call around 7:15- 8:00 am to check on you and address any questions or concerns that you may have regarding the information given to you following your procedure. If we do not reach you, we will leave a message.     If any biopsies were taken you will be contacted by phone or by letter within the next 1-3 weeks.  Please call us  at [phone] if you have not heard about the biopsies in 3 weeks.    SIGNATURES/CONFIDENTIALITY: You and/or your care partner have signed paperwork which will be entered into your electronic medical record.  These signatures attest to the fact that that the information above on your After Visit Summary has been reviewed and is understood.  Full responsibility of the confidentiality of this discharge information lies with you and/or your care-partner.

## 2023-11-14 NOTE — Op Note (Signed)
 [facility] Patient Name: Amanda [name] Procedure Date: 11/14/2023 8:15 AM MRN: [id number] Endoscopist: Elspeth P. Leigh , MD, [id number] Age: 73 Referring MD:  Date of Birth: 09-18-1950 Gender: Female Account #: [id number] Procedure:                Colonoscopy Indications:              High risk colon cancer surveillance: Personal                            history of colonic polyps - history of polyps,                            including advanced lesion in 2017 Medicines:                Monitored Anesthesia Care Procedure:                Pre-Anesthesia Assessment:                           - Prior to the procedure, a History and Physical                            was performed, and patient medications and                            allergies were reviewed. The patient's tolerance of                            previous anesthesia was also reviewed. The risks                            and benefits of the procedure and the sedation                            options and risks were discussed with the patient.                            All questions were answered, and informed consent                            was obtained. Prior Anticoagulants: The patient has                            taken no anticoagulant or antiplatelet agents. ASA                            Grade Assessment: II - A patient with mild systemic                            disease. After reviewing the risks and benefits,                            the patient was deemed in satisfactory condition to  undergo the procedure.                           After obtaining informed consent, the colonoscope                            was passed under direct vision. Throughout the                            procedure, the patient's blood pressure, pulse, and                            oxygen saturations were monitored continuously. The                            Olympus Scope SN  [id number] was introduced through the                            anus and advanced to the the cecum, identified by                            appendiceal orifice and ileocecal valve. The                            colonoscopy was performed without difficulty. The                            patient tolerated the procedure well. The quality                            of the bowel preparation was adequate. The                            ileocecal valve, appendiceal orifice, and rectum                            were photographed. Scope In: 8:32:12 AM Scope Out: 8:52:41 AM Scope Withdrawal Time: 0 hours 12 minutes 41 seconds  Total Procedure Duration: 0 hours 20 minutes 29 seconds  Findings:                 Skin tags were found on perianal exam.                           A 3 mm polyp was found in the ascending colon. The                            polyp was flat. The polyp was removed with a cold                            snare. Resection and retrieval were complete.                           A 3 mm polyp was found in the descending colon. The  polyp was sessile. The polyp was removed with a                            cold snare. Resection and retrieval were complete.                           A 3 mm polyp was found in the sigmoid colon. The                            polyp was sessile. The polyp was removed with a                            cold snare. Resection and retrieval were complete.                           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                           Internal hemorrhoids were found during                            retroflexion. The hemorrhoids were small.                           The colon was tortous. The exam was otherwise                            without abnormality. Complications:            No immediate complications. Estimated blood loss:                            Minimal. Estimated  Blood Loss:     Estimated blood loss was minimal. Impression:               - Perianal skin tags found on perianal exam.                           - One 3 mm polyp in the ascending colon, removed                            with a cold snare. Resected and retrieved.                           - One 3 mm polyp in the descending colon, removed                            with a cold snare. Resected and retrieved.                           - One 3 mm polyp in the sigmoid colon, removed with  a cold snare. Resected and retrieved.                           - Melanosis coli.                           - Tortous colon.                           - Internal hemorrhoids.                           - The examination was otherwise normal. Recommendation:           - Patient has a contact number available for                            emergencies. The signs and symptoms of potential                            delayed complications were discussed with the                            patient. Return to normal activities tomorrow.                            Written discharge instructions were provided to the                            patient.                           - Resume previous diet.                           - Continue present medications.                           - Await pathology results.                           - Consideration for repeat colonoscopy in 5 years                            for surveillance, given history of 1cm lesion                            removed in the past.                           - Patient reports recurrent dysphagia, has had EGD                            with dilation with benefit in the past. Schedule                            for EGD with dilation if she wishes to proceed  with                            an exam. Elspeth P. Leigh, MD 11/14/2023 8:58:52 AM This report has been signed electronically.

## 2023-11-15 ENCOUNTER — Telehealth: Payer: Self-pay

## 2023-11-15 NOTE — Telephone Encounter (Signed)
 No answer, left message to call if having any issues or concerns, B.Vale Mousseau RN

## 2023-11-20 ENCOUNTER — Ambulatory Visit: Payer: Self-pay | Admitting: Gastroenterology

## 2023-11-20 LAB — SURGICAL PATHOLOGY

## 2023-11-22 NOTE — Telephone Encounter (Signed)
 Left message for patient to call back

## 2023-11-24 ENCOUNTER — Encounter: Payer: Self-pay | Admitting: Gastroenterology

## 2023-11-27 ENCOUNTER — Ambulatory Visit: Payer: Self-pay

## 2023-11-27 ENCOUNTER — Emergency Department (HOSPITAL_BASED_OUTPATIENT_CLINIC_OR_DEPARTMENT_OTHER): Admitting: Radiology

## 2023-11-27 ENCOUNTER — Emergency Department (HOSPITAL_BASED_OUTPATIENT_CLINIC_OR_DEPARTMENT_OTHER)
Admission: EM | Admit: 2023-11-27 | Discharge: 2023-11-27 | Disposition: A | Discharge status: Home / Self Care | Source: Ambulatory Visit

## 2023-11-27 ENCOUNTER — Other Ambulatory Visit: Payer: Self-pay

## 2023-11-27 DIAGNOSIS — R0789 Other chest pain: Secondary | ICD-10-CM | POA: Diagnosis not present

## 2023-11-27 DIAGNOSIS — R079 Chest pain, unspecified: Secondary | ICD-10-CM | POA: Insufficient documentation

## 2023-11-27 DIAGNOSIS — Z7982 Long term (current) use of aspirin: Secondary | ICD-10-CM | POA: Diagnosis not present

## 2023-11-27 LAB — BASIC METABOLIC PANEL WITH GFR
Anion gap: 13 (ref 5–15)
BUN: 18 mg/dL (ref 8–23)
CO2: 23 mmol/L (ref 22–32)
Calcium: 9.7 mg/dL (ref 8.9–10.3)
Chloride: 102 mmol/L (ref 98–111)
Creatinine, Ser: 1.02 mg/dL — ABNORMAL HIGH (ref 0.44–1.00)
GFR, Estimated: 58 mL/min — ABNORMAL LOW (ref >60)
Glucose, Bld: 143 mg/dL — ABNORMAL HIGH (ref 70–99)
Potassium: 4.1 mmol/L (ref 3.5–5.1)
Sodium: 138 mmol/L (ref 135–145)

## 2023-11-27 LAB — CBC
HCT: 37 % (ref 36.0–46.0)
Hemoglobin: 12.1 g/dL (ref 12.0–15.0)
MCH: 30 pg (ref 26.0–34.0)
MCHC: 32.7 g/dL (ref 30.0–36.0)
MCV: 91.8 fL (ref 80.0–100.0)
Platelets: 231 K/uL (ref 150–400)
RBC: 4.03 MIL/uL (ref 3.87–5.11)
RDW: 12.5 % (ref 11.5–15.5)
WBC: 4.6 K/uL (ref 4.0–10.5)
nRBC: 0 % (ref 0.0–0.2)

## 2023-11-27 LAB — TROPONIN T, HIGH SENSITIVITY
Troponin T High Sensitivity: <15 ng/L (ref <19)
Troponin T High Sensitivity: <15 ng/L (ref <19)

## 2023-11-27 NOTE — Progress Notes (Signed)
 Cardiology Office Note   Date:  11/28/2023  ID:  Amanda, [name] 10/20/1950, MRN [id number] PCP: Katrinka Garnette KIDD, MD  [facility] Providers Cardiologist:  Alm Clay, MD {   History of Present Illness Amanda [name] is a 73 y.o. female with a past medical history of coronary artery disease, nonocclusive, hypertension, hyperlipidemia, NSTEMI, palpitations, leg pain here for follow-up appointment after recent ER visit.  Was seen by Dr. Clay back in January of this year.  History includes she developed chest tightness when she was out to dinner 02/10/23 and an EKG was done showing possible inferior ST elevations.  Nonurgent catheterization revealed minimal disease.  No recurrent chest pain or pressure.  No real dyspnea.  Unable to tolerate Imdur  and therefore was discontinued.  Reluctant to take losartan  citing that it might drop her blood pressures.  In the past she had reduced her metoprolol  succinate from 50 down to 25.  She was started on low-dose atorvastatin  5 mg which she subsequently dropped.  She was told to start and increase her rosuvastatin  to 40 mg daily and she did not notice any memory issues, joint pains, or muscle aches.  Referred to Dr. Quirino for lipid management.  Was ultimately seen by Dr. Mona in lipid clinic April 25, 2023 for lipid management.  Labs in November showed total cholesterol 224, triglycerides 75, HDL 85, and LDL 126.  Improvement from 163 however remains above target LDL of less than 70.  Currently was taking rosuvastatin  40 mg daily.  However, compliance with medication was reduced because she had noted some possible side effects.  Advised a 2 to 3-week statin holiday for reassessment.  Plan to recheck a lipid panel in 3 months.  Patient did report occasional sharp pains in January when she was seen which she described more like a nerve cramp-like pain.  She denied any swelling in her legs or pain in her calves or thighs at rest.  She  mentioned drinking Pedialyte due to issues with dehydration.  Generally active reporting that she walks 2 miles without any problems.  Expressed a desire to continue staying active and exercising.  She is scheduled follow-up with her lipid specialist in April.  She was seen in the ED yesterday for chest pain.  Sharp and on the left side.  Lasting several seconds earlier that morning before she woke up.  No associated nausea, vomiting, dyspnea, diaphoresis.  Did not feel lightheaded, no syncope or near syncope.  Had not had return of symptoms since this morning.  No anginal symptoms the last several days.  EKG without STEMI.  Troponins negative x 2.  ACS unlikely.  Considered PE however low risk based on Wells criteria.  No tachycardia hypoxia either.  Chest x-ray without pneumonia, pneumothorax.  No significant metabolic derangements.  No leukocytosis to suggest systemic infection.  No anemia that would explain her symptoms.  Reassuring vital signs and workup.  Discharged in stable condition.  Today, she presents with coronary artery disease with concerns about cholesterol management and medication side effects.  She experiences sharp chest pain on the left side radiating from her shoulder downwards, occurring after getting out of bed and standing up. She was previously hospitalized in the ICU for two days due to heart-related issues described as 'spasms'.  She is concerned about her cholesterol management, particularly the side effects of rosuvastatin , which she associates with 'brain fog'. She has stopped taking rosuvastatin  and is worried about her LDL levels, which were  high at 167 mg/dL, while her triglycerides were reported as good. She is interested in exploring non-statin options for cholesterol management.  She maintains a healthy lifestyle, including regular exercise and a balanced diet, but feels her efforts are not reflected in her cholesterol levels. She is proactive about her health, seeking  information on alternative treatments.  She underwent a cardiac catheterization in the fall of the previous year, which showed a 30% blockage in the circumflex artery, with other arteries being clear. She takes metoprolol  for extra heartbeats and reports no current leg pain or palpitations.  She mentions a history of dehydration and has started using a new electrolyte supplement to address this issue, currently trying a product called Nectar.  Reports no shortness of breath nor dyspnea on exertion. No edema, orthopnea, PND. Reports no palpitations.   Discussed the use of AI scribe software for clinical note transcription with the patient, who gave verbal consent to proceed.    ROS: Pertinent ROS in HPI  Studies Reviewed  Echocardiogram 2023/02/12  IMPRESSIONS     1. Left ventricular ejection fraction, by estimation, is 60 to 65%. The  left ventricle has normal function. The left ventricle has no regional  wall motion abnormalities. Left ventricular diastolic parameters are  consistent with Grade I diastolic  dysfunction (impaired relaxation).   2. Right ventricular systolic function is normal. The right ventricular  size is normal. There is normal pulmonary artery systolic pressure.   3. The mitral valve is normal in structure. Trivial mitral valve  regurgitation. No evidence of mitral stenosis.   4. The aortic valve is tricuspid. Aortic valve regurgitation is trivial.  No aortic stenosis is present.   5. The inferior vena cava is normal in size with greater than 50%  respiratory variability, suggesting right atrial pressure of 3 mmHg.   FINDINGS   Left Ventricle: Left ventricular ejection fraction, by estimation, is 60  to 65%. The left ventricle has normal function. The left ventricle has no  regional wall motion abnormalities. The left ventricular internal cavity  size was normal in size. There is   no left ventricular hypertrophy. Left ventricular diastolic parameters  are  consistent with Grade I diastolic dysfunction (impaired relaxation).  Indeterminate filling pressures.   Right Ventricle: The right ventricular size is normal. No increase in  right ventricular wall thickness. Right ventricular systolic function is  normal. There is normal pulmonary artery systolic pressure. The tricuspid  regurgitant velocity is 1.51 m/s, and   with an assumed right atrial pressure of 3 mmHg, the estimated right  ventricular systolic pressure is 12.1 mmHg.   Left Atrium: Left atrial size was normal in size.   Right Atrium: Right atrial size was normal in size.   Pericardium: There is no evidence of pericardial effusion.   Mitral Valve: The mitral valve is normal in structure. Trivial mitral  valve regurgitation. No evidence of mitral valve stenosis.   Tricuspid Valve: The tricuspid valve is normal in structure. Tricuspid  valve regurgitation is trivial. No evidence of tricuspid stenosis.   Aortic Valve: The aortic valve is tricuspid. Aortic valve regurgitation is  trivial. No aortic stenosis is present.   Pulmonic Valve: The pulmonic valve was normal in structure. Pulmonic valve  regurgitation is not visualized. No evidence of pulmonic stenosis.   Aorta: The aortic root is normal in size and structure.   Venous: The inferior vena cava is normal in size with greater than 50%  respiratory variability, suggesting right atrial pressure of  3 mmHg.   IAS/Shunts: No atrial level shunt detected by color flow Doppler.       Cardiac catheterization 02/03/2023 Left Main  Mid LM to Dist LM lesion is 10% stenosed.    Left Circumflex  Dist Cx lesion is 30% stenosed.    Fourth Obtuse Marginal Branch  Vessel is small in size.    Intervention   No interventions have been documented.   Wall Motion              Left Heart  Left Ventricle The left ventricular size is normal. The left ventricular systolic function is normal. LV end diastolic pressure is normal.  Preserved global LV contractility with EF estimate approximately 55%.  There is a suggestion of a subtle region of possible very mild hypocontractility in the mid anterolateral wall . LVEDP 16 mmHg.   Coronary Diagrams  Diagnostic Dominance: Right  Intervention    Physical Exam VS:  BP 111/72   Pulse 68   Ht 5' 6 (1.676 m)   Wt 149 lb 6.4 oz (67.8 kg)   LMP  (LMP Unknown)   SpO2 97%   BMI 24.11 kg/m        Wt Readings from Last 3 Encounters:  11/28/23 149 lb 6.4 oz (67.8 kg)  11/27/23 145 lb (65.8 kg)  11/14/23 147 lb (66.7 kg)    GEN: Well nourished, well developed in no acute distress NECK: No JVD; No carotid bruits CARDIAC: RRR, no murmurs, rubs, gallops RESPIRATORY:  Clear to auscultation without rales, wheezing or rhonchi  ABDOMEN: Soft, non-tender, non-distended EXTREMITIES:  No edema; No deformity   ASSESSMENT AND PLAN  Coronary artery disease Coronary artery disease with 30% stenosis in the circumflex artery. Recent ED evaluation for chest pain negative for acute coronary syndrome. Pain likely non-cardiac. - Continue hyperlipidemia management to reduce cardiac risk.  Hyperlipidemia LDL elevated at 167 mg/dL, target <29 mg/dL. Discussed non-statin options due to statin side effects. Options include ezetimibe , Repatha, and Leqvio. - Order lipid panel and LFTs today. - Provide educational materials on Laona, Leqvio, and Nexletol. - She will research medication options and communicate decision via MyChart. - Consider prior authorization for injectables if chosen.  Hypertension -well controlled today, continue low sodium diet -continue current medications  Palpitations Palpitations controlled with metoprolol . - Continue metoprolol  as prescribed.  History of cardiac catheterization Previous catheterization showed 30% stenosis in circumflex artery.  Follow-up Six-month follow-up planned to align with annual schedule. - Schedule follow-up with Dr. Anner  in six months.      Dispo: She can follow-up in 6 months with Dr. Anner   Signed, Orren LOISE Fabry, PA-C

## 2023-11-27 NOTE — ED Triage Notes (Signed)
 Pt caox4, ambulatory NAD c/o of pain in left shoulder down to the left chest started this morning and only lasted a few seconds. No other symptoms associated.

## 2023-11-27 NOTE — ED Notes (Signed)
 Ambulatory to restroom

## 2023-11-27 NOTE — ED Provider Notes (Signed)
 [place] EMERGENCY DEPARTMENT AT [facility] Provider Note   CSN: [id number] Arrival date & time: 11/27/23  1102     Patient presents with: Chest Pain   Amanda [name] is a 73 y.o. female.   73 year old presenting emergency department for chest pain.  Sharp left-sided.  Lasted for several seconds this morning when she woke.  No associated nausea vomiting, dyspnea, diaphoresis.  Did not feel lightheaded, no near-syncope or syncope.  Has not had any return of symptoms since this morning.  No anginal symptoms past several days either.   Chest Pain      Prior to Admission medications   Medication Sig Start Date End Date Taking? Authorizing Provider  APPLE CIDER VINEGAR PO Take 1 tablet by mouth in the morning, at noon, and at bedtime.    [provider]  aspirin  EC 81 MG tablet Take 1 tablet (81 mg total) by mouth daily. Swallow whole. 02/04/23   Zhao, Xika, NP  bisacodyl (DULCOLAX) 5 MG EC tablet Take 5 mg by mouth daily as needed for moderate constipation.    [provider]  calcium  carbonate (CALCIUM  600) 1500 (600 Ca) MG TABS tablet 1 tablet with food Orally Twice a day    [provider]  CALCIUM  PO Take 2 tablets by mouth daily.    [provider]  clonazePAM  (KLONOPIN ) 0.5 MG tablet Take 1 tablet (0.5 mg total) by mouth at bedtime as needed for anxiety (sleep). 07/25/23   Katrinka Garnette KIDD, MD  CO ENZYME Q-10 PO Take 1 tablet by mouth daily.    [provider]  Cyanocobalamin  (VITAMIN B-12 ER PO) Take 1 tablet by mouth daily.    [provider]  escitalopram  (LEXAPRO ) 10 MG tablet TAKE 1 TABLET BY MOUTH EVERYDAY AT BEDTIME 05/18/23   Katrinka Garnette KIDD, MD  MAGNESIUM PO Take 1 tablet by mouth at bedtime.    [provider]  Melatonin 3 MG CAPS Take 6 mg by mouth at bedtime.    [provider]  meloxicam (MOBIC) 15 MG tablet TAKE 1 TABLET A DAY FOR PAIN AND INFLAMMATION 06/12/23   [provider]  metoprolol  succinate (TOPROL -XL) 25 MG 24 hr tablet TAKE 1 TABLET BY MOUTH EVERY DAY WITH OR IMMEDIATELY FOLLOWING A MEAL 06/12/23   Anner Alm ORN, MD  Omega 3 1000 MG CAPS 1 capsule.    [provider]  OVER THE COUNTER MEDICATION Take 1 tablet by mouth at bedtime. Renew Life [place]    [provider]  OVER THE COUNTER MEDICATION Take 1 tablet by mouth daily. Life extension    [provider]  Polyethyl Glycol-Propyl Glycol (SYSTANE OP) Place 2 drops into both eyes as needed (dryness).    [provider]  rOPINIRole  (REQUIP ) 0.5 MG tablet Take 1 tablet (0.5 mg total) by mouth at bedtime. 10/30/23   Katrinka Garnette KIDD, MD  rosuvastatin  (CRESTOR ) 40 MG tablet Take 40 mg by mouth daily. Patient not taking: Reported on 11/14/2023 08/07/23   [provider]  Turmeric (QC TUMERIC COMPLEX PO) Take 1 capsule by mouth daily.    [provider]  valACYclovir  (VALTREX ) 500 MG tablet ONE TAB TWICE A DAY AS NEEDED FOR OUTBREAK 03/30/23   Katrinka Garnette KIDD, MD  Vitamin D -Vitamin K (VITAMIN K2-VITAMIN D3 PO) Take 1 capsule by mouth daily.    [provider]    Allergies: Ambien [zolpidem tartrate], Cortisone, Eszopiclone and related, Levofloxacin, Zolpidem, Dicloxacillin, Dilantin [phenytoin sodium extended],  Erythromycin, Amlodipine , Avelox  [moxifloxacin  hcl in nacl], Dicloxacillin sodium, Eszopiclone, Gluten meal, Influenza virus vaccine, Isosorbide , Loratadine , Moxifloxacin , Nefazodone, Nitrofuran derivatives, Other, Phenytoin, Phenytoin sodium extended, Trazodone, and Trazodone and nefazodone    Review of Systems  Cardiovascular:  Positive for chest pain.    Updated Vital Signs BP (!) 116/55   Pulse (!) 56   Temp 98.8 F (37.1 C) (Oral)   Resp 11   Ht 5' 6 (1.676 m)   Wt 65.8 kg   LMP  (LMP Unknown)   SpO2 99%   BMI 23.40 kg/m   Physical Exam Vitals and nursing note reviewed.  Constitutional:      General: She  is not in acute distress.    Appearance: She is not toxic-appearing.  Cardiovascular:     Rate and Rhythm: Normal rate and regular rhythm.     Heart sounds: Normal heart sounds.  Pulmonary:     Effort: Pulmonary effort is normal.     Breath sounds: Normal breath sounds.  Abdominal:     Palpations: Abdomen is soft.  Musculoskeletal:     Right lower leg: No edema.     Left lower leg: No edema.  Skin:    General: Skin is warm.     Capillary Refill: Capillary refill takes less than 2 seconds.  Neurological:     Mental Status: She is alert.  Psychiatric:        Mood and Affect: Mood normal.        Behavior: Behavior normal.     (all labs ordered are listed, but only abnormal results are displayed) Labs Reviewed  BASIC METABOLIC PANEL WITH GFR - Abnormal; Notable for the following components:      Result Value   Glucose, Bld 143 (*)    Creatinine, Ser 1.02 (*)    GFR, Estimated 58 (*)    All other components within normal limits  CBC  TROPONIN T, HIGH SENSITIVITY  TROPONIN T, HIGH SENSITIVITY    EKG: None  Radiology: DG Chest 2 View Result Date: 11/27/2023 CLINICAL DATA:  Chest pain EXAM: CHEST - 2 VIEW COMPARISON:  Chest radiograph dated 02/02/2023 FINDINGS: Normal lung volumes. No focal consolidations. No pleural effusion or pneumothorax. The heart size and mediastinal contours are within normal limits. No acute osseous abnormality. IMPRESSION: No active cardiopulmonary disease. Electronically Signed   By: Limin  Xu M.D.   On: 11/27/2023 11:47     Procedures   Medications Ordered in the ED - No data to display                                  Medical Decision Making This is a 73 year old female presenting emergency department for transient chest pain this morning lasting several seconds.  She is afebrile nontachycardic, hemodynamically stable.  Maintaining oxygen saturation on room air.  EKG without STEMI on my independent interpretation.  Troponins negative x 2.   ACS unlikely.  Consider PE, however low risk based on Wells criteria.  No tachycardia hypoxia either.  Chest x-ray without pneumonia pneumothorax, independent interpretation.  No significant metabolic derangements.  BUN/creatinine normal.  No leukocytosis to suggest systemic infection.  No anemia that would explain her symptoms today.  Given patient's reassuring vitals, and workup that she can follow-up outpatient.  Agreeable to plan.  Return precautions given.  Amount and/or Complexity of Data Reviewed External Data Reviewed:     Details: Echo in 2024 with normal  EF Labs: ordered. Decision-making details documented in ED Course. Radiology: ordered and independent interpretation performed. ECG/medicine tests: independent interpretation performed.  Risk Decision regarding hospitalization. Diagnosis or treatment significantly limited by social determinants of health. Risk Details: Poor health literacy      Final diagnoses:  Chest pain, unspecified type    ED Discharge Orders     None          Neysa Caron PARAS, DO 11/27/23 1543

## 2023-11-27 NOTE — Telephone Encounter (Signed)
 FYI Only or Action Required?: FYI only for provider.  Patient was last seen in primary care on 09/19/2023 by Katrinka Garnette KIDD, MD.  Called Nurse Triage reporting Chest Pain.  Symptoms began today.  Interventions attempted: Nothing.  Symptoms are: stable.  Triage Disposition: Go to ED Now (Notify PCP)  Patient/caregiver understands and will follow disposition?: Yes    Copied from CRM [id number]. Topic: Clinical - Red Word Triage >> Nov 27, 2023 10:15 AM Amanda [name] wrote: Chest pain       Reason for Disposition  SEVERE chest pain  Answer Assessment - Initial Assessment Questions 1. LOCATION: Where does it hurt?       Left shoulder to chest /left chest area 2. RADIATION: Does the pain go anywhere else? (e.g., into neck, jaw, arms, back)     chest 3. ONSET: When did the chest pain begin? (Minutes, hours or days)      today 4. PATTERN: Does the pain come and go, or has it been constant since it started?  Does it get worse with exertion?      Pain came on all of sudden then went away 5. DURATION: How long does it last (e.g., seconds, minutes, hours)     na 6. SEVERITY: How bad is the pain?  (e.g., Scale 1-10; mild, moderate, or severe)     7/10 7. CARDIAC RISK FACTORS: Do you have any history of heart problems or risk factors for heart disease? (e.g., angina, prior heart attack; diabetes, high blood pressure, high cholesterol, smoker, or strong family history of heart disease) Heart history  8. PULMONARY RISK FACTORS: Do you have any history of lung disease?  (e.g., blood clots in lung, asthma, emphysema, birth control pills)     na 9. CAUSE: What do you think is causing the chest pain?     unknown 10. OTHER SYMPTOMS: Do you have any other symptoms? (e.g., dizziness, nausea, vomiting, sweating, fever, difficulty breathing, cough)       no 11. PREGNANCY: Is there any chance you are pregnant? When was your last menstrual period?       Na  Chest  pain is gone now, pt stated heart history & hospital last year.  Nurse referred pt to ED  Protocols used: Chest Pain-A-AH

## 2023-11-27 NOTE — ED Notes (Signed)
 Reviewed AVS/discharge instruction with patient. Time allotted for and all questions answered. Patient is agreeable for d/c and escorted to ed exit by staff.

## 2023-11-27 NOTE — Discharge Instructions (Signed)
 Please follow-up with your primary doctor and cardiologist.  Return to the fevers, chills, chest pain returns, shortness of breath or any new or worsening symptoms that are concerning to you.

## 2023-11-28 ENCOUNTER — Encounter: Payer: Self-pay | Admitting: Physician Assistant

## 2023-11-28 ENCOUNTER — Ambulatory Visit: Attending: Physician Assistant | Admitting: Physician Assistant

## 2023-11-28 VITALS — BP 111/72 | HR 68 | Ht 66.0 in | Wt 149.4 lb

## 2023-11-28 DIAGNOSIS — R002 Palpitations: Secondary | ICD-10-CM | POA: Insufficient documentation

## 2023-11-28 DIAGNOSIS — E785 Hyperlipidemia, unspecified: Secondary | ICD-10-CM | POA: Diagnosis not present

## 2023-11-28 DIAGNOSIS — I10 Essential (primary) hypertension: Secondary | ICD-10-CM | POA: Insufficient documentation

## 2023-11-28 DIAGNOSIS — M79661 Pain in right lower leg: Secondary | ICD-10-CM | POA: Diagnosis not present

## 2023-11-28 DIAGNOSIS — Z79899 Other long term (current) drug therapy: Secondary | ICD-10-CM | POA: Insufficient documentation

## 2023-11-28 DIAGNOSIS — Z789 Other specified health status: Secondary | ICD-10-CM | POA: Diagnosis not present

## 2023-11-28 DIAGNOSIS — I2510 Atherosclerotic heart disease of native coronary artery without angina pectoris: Secondary | ICD-10-CM | POA: Diagnosis not present

## 2023-11-28 DIAGNOSIS — I214 Non-ST elevation (NSTEMI) myocardial infarction: Secondary | ICD-10-CM | POA: Insufficient documentation

## 2023-11-28 NOTE — Patient Instructions (Addendum)
 Medication Instructions:  Your physician recommends that you continue on your current medications as directed. Please refer to the Current Medication list given to you today.  *If you need a refill on your cardiac medications before your next appointment, please call your pharmacy*  Lab Work: LIPID LFTS TODAY OUR LAB IS ON THE FIRST FLOOR If you have labs (blood work) drawn today and your tests are completely normal, you will receive your results only by: MyChart Message (if you have MyChart) OR A paper copy in the mail If you have any lab test that is abnormal or we need to change your treatment, we will call you to review the results.  Testing/Procedures: NONE  Follow-Up: At [facility], you and your health needs are our priority.  As part of our continuing mission to provide you with exceptional heart care, our providers are all part of one team.  This team includes your primary Cardiologist (physician) and Advanced Practice Providers or APPs (Physician Assistants and Nurse Practitioners) who all work together to provide you with the care you need, when you need it.  Your next appointment:   6 month(s)  Provider:   Alm Clay, MD    Other Instructions   Evolocumab Injection What is this medication? EVOLOCUMAB (e voe LOK ue mab) treats high cholesterol. It may also be used to lower the risk of heart attack, stroke, worsening chest pain (unstable angina), and a type of heart surgery. It works by decreasing bad cholesterol (such as LDL) in your blood. It is a monoclonal antibody. Changes to diet and exercise are often combined with this medication. This medicine may be used for other purposes; ask your health care provider or pharmacist if you have questions. COMMON BRAND NAME(S): Repatha, Repatha SureClick What should I tell my care team before I take this medication? They need to know if you have any of these conditions: An unusual or allergic reaction to evolocumab,  latex, other medications, foods, dyes, or preservatives Pregnant or trying to get pregnant Breast-feeding How should I use this medication? This medication is injected under the skin. You will be taught how to prepare and give it. Take it as directed on the prescription label at the same time every day. Keep taking it unless your care team tells you to stop. It is important that you put your used needles and syringes in a special sharps container. Do not put them in a trash can. If you do not have a sharps container, call your pharmacist or care team to get one. This medication comes with INSTRUCTIONS FOR USE. Ask your pharmacist for directions on how to use this medication. Read the information carefully. Talk to your pharmacist or care team if you have questions. Talk to your care team about the use of this medication in children. While it may be prescribed for children as young as 10 years for selected conditions, precautions do apply. Overdosage: If you think you have taken too much of this medicine contact a poison control center or emergency room at once. NOTE: This medicine is only for you. Do not share this medicine with others. What if I miss a dose? It is important not to miss any doses. Talk to your care team about what to do if you miss a dose. What may interact with this medication? Interactions are not expected. This list may not describe all possible interactions. Give your health care provider a list of all the medicines, herbs, non-prescription drugs, or dietary supplements  you use. Also tell them if you smoke, drink alcohol, or use illegal drugs. Some items may interact with your medicine. What should I watch for while using this medication? Visit your care team for regular checks on your progress. Tell your care team if your symptoms do not start to get better or if they get worse. You may need blood work while you are taking this medication. Do not wear the on-body infuser during  an MRI. Taking this medication is only part of a total heart healthy program. Ask your care team if there are other changes you can make to improve your overall health. What side effects may I notice from receiving this medication? Side effects that you should report to your care team as soon as possible: Allergic reactions or angioedema--skin rash, itching or hives, swelling of the face, eyes, lips, tongue, arms, or legs, trouble swallowing or breathing Side effects that usually do not require medical attention (report to your care team if they continue or are bothersome): Back pain Flu-like symptoms--fever, chills, muscle pain, cough, headache, fatigue Pain, redness, or irritation at injection site Runny or stuffy nose Sore throat This list may not describe all possible side effects. Call your doctor for medical advice about side effects. You may report side effects to FDA at 1-800-FDA-1088. Where should I keep my medication? Keep out of the reach of children and pets. Store in a refrigerator or at room temperature between 20 and 25 degrees C (68 and 77 degrees F). Refrigeration (preferred): Store it in the refrigerator. Do not freeze. Keep it in the original carton until you are ready to take it. Remove the dose from the carton about 30 minutes before it is time for you to take it. Get rid of any unused medication after the expiration date. Room temperature: This medication may be stored at room temperature for up to 30 days. Keep it in the original carton until you are ready to take it. If it is stored at room temperature, get rid of any unused medication after 30 days or after it expires, whichever is first. Protect from light. Do not shake. Avoid exposure to extreme heat. To get rid of medications that are no longer needed or have expired: Take the medication to a medication take-back program. Check with your pharmacy or law enforcement to find a location. If you cannot return the  medication, ask your pharmacist or care team how to get rid of this medication safely. NOTE: This sheet is a summary. It may not cover all possible information. If you have questions about this medicine, talk to your doctor, pharmacist, or health care provider.  2025 Elsevier/Gold Standard (2023-06-12 00:00:00)    Inclisiran Injection What is this medication? INCLISIRAN (in kli SIR an) treats high cholesterol. It works by decreasing bad cholesterol (such as LDL) in your blood. Changes to diet and exercise are often combined with this medication. This medicine may be used for other purposes; ask your health care provider or pharmacist if you have questions. COMMON BRAND NAME(S): LEQVIO What should I tell my care team before I take this medication? They need to know if you have any of these conditions: An unusual or allergic reaction to inclisiran, other medications, foods, dyes, or preservatives Pregnant or trying to get pregnant Breast-feeding How should I use this medication? This medication is injected under the skin. It is given by your care team in a hospital or clinic setting. Talk to your care team about the use of  this medication in children. Special care may be needed. Overdosage: If you think you have taken too much of this medicine contact a poison control center or emergency room at once. NOTE: This medicine is only for you. Do not share this medicine with others. What if I miss a dose? Keep appointments for follow-up doses. It is important not to miss your dose. Call your care team if you are unable to keep an appointment. What may interact with this medication? Interactions are not expected. This list may not describe all possible interactions. Give your health care provider a list of all the medicines, herbs, non-prescription drugs, or dietary supplements you use. Also tell them if you smoke, drink alcohol, or use illegal drugs. Some items may interact with your  medicine. What should I watch for while using this medication? Visit your care team for regular checks on your progress. Tell your care team if your symptoms do not start to get better or if they get worse. You may need blood work while you are taking this medication. What side effects may I notice from receiving this medication? Side effects that you should report to your care team as soon as possible: Allergic reactions--skin rash, itching, hives, swelling of the face, lips, tongue, or throat Side effects that usually do not require medical attention (report these to your care team if they continue or are bothersome): Joint pain Pain, redness, or irritation at injection site This list may not describe all possible side effects. Call your doctor for medical advice about side effects. You may report side effects to FDA at 1-800-FDA-1088. Where should I keep my medication? This medication is given in a hospital or clinic. It will not be stored at home. NOTE: This sheet is a summary. It may not cover all possible information. If you have questions about this medicine, talk to your doctor, pharmacist, or health care provider.  2024 Elsevier/Gold Standard (2023-04-07 00:00:00)   Bempedoic Acid; Ezetimibe  Tablets What is this medication? BEMPEDOIC ACID; EZETIMIBE  (BEM pe DOE ik AS id; ez ET i mibe) treats high cholesterol and reduces the risk of heart attack. It works by reducing the amount of cholesterol made by your body. It also reduces the amount of cholesterol absorbed from the food you eat. This decreases the amount of bad cholesterol (such as LDL) in your blood. Changes to diet and exercise are often combined with this medication. This medicine may be used for other purposes; ask your health care provider or pharmacist if you have questions. COMMON BRAND NAME(S): NEXLIZET What should I tell my care team before I take this medication? They need to know if you have any of these  conditions: Gout Kidney problems Liver disease Tendon problems An unusual or allergic reaction to bempedoic acid, ezetimibe , other medications, foods, dyes, or preservatives Pregnant or trying to become pregnant Breast-feeding How should I use this medication? Take this medication by mouth. Take it as directed on the prescription label at the same time every day. Do not cut, crush, or chew this medication. Swallow the tablets whole. You can take it with or without food. If it upsets your stomach, take it with food. Keep taking it unless your care team tells you to stop. Take this medication at least 2 hours BEFORE or at least 4 hours AFTER administration of a bile acid sequestrant. Talk to your care team about the use of this medication in children. Special care may be needed. Take this medication by mouth. Take it  as directed on the prescription label at the same time every day. Do not cut, crush or chew this medication. Swallow the tablets whole. You can take it with or without food. If it upsets your stomach, take it with food. Keep taking it unless your care team tells you to stop.Take this medication at least 2 hours BEFORE or at least 4 hours AFTER administration of a bile acid sequestrant.Talk to your care team about the use of this medication in children. Special care may be needed. Overdosage: If you think you have taken too much of this medicine contact a poison control center or emergency room at once. NOTE: This medicine is only for you. Do not share this medicine with others. What if I miss a dose? If you miss a dose, take it as soon as you can. If it is almost time for your next dose, take only that dose. Do not take double or extra doses. What may interact with this medication? Do not take this medication with any of the following: Fenofibrate Gemfibrozil This medication may also interact with the following: Antacids Cyclosporine  Other medications to lower cholesterol or  triglycerides Pravastatin Simvastatin This list may not describe all possible interactions. Give your health care provider a list of all the medicines, herbs, non-prescription drugs, or dietary supplements you use. Also tell them if you smoke, drink alcohol, or use illegal drugs. Some items may interact with your medicine. What should I watch for while using this medication? Visit your care team for regular checks on your progress. Tell your care team if your symptoms do not start to get better or if they get worse. Your care team may tell you to stop taking this medication if you develop muscle problems. If your muscle problems do not go away after stopping this medication, contact your care team. Taking this medication is only part of a total heart healthy program. Ask your care team if there are other changes you can make to improve your overall health. What side effects may I notice from receiving this medication? Side effects that you should report to your care team as soon as possible: Allergic reactions--skin rash, itching, hives, swelling of the face, lips, tongue, or throat High uric acid level--severe pain, redness, warmth, or swelling in joints, pain or trouble passing urine, pain in the lower back or sides Joint, muscle, or tendon pain, swelling, or stiffness Side effects that usually do not require medical attention (report to your care team if they continue or are bothersome): Diarrhea Fatigue Muscle spasms Stomach pain This list may not describe all possible side effects. Call your doctor for medical advice about side effects. You may report side effects to FDA at 1-800-FDA-1088. Where should I keep my medication? Keep out of the reach of children and pets. Store between 15 and 30 degrees C (59 and 86 degrees F). Keep this medication in the original container. Protect from moisture. Keep the container tightly closed. Do not throw out the packet in the container. It keeps the  medication dry. Avoid exposure to extreme heat. Get rid of any unused medication after the expiration date. To get rid of medications that are no longer needed or have expired: Take the medication to a medication take-back program. Check with your pharmacy or law enforcement to find a location. If you cannot return the medication, check the label or package insert to see if the medication should be thrown out in the trash or flushed down the toilet. If you  are not sure, ask your care team. If it is safe to put it in the trash, empty the medication out of the container. Mix the medication with cat litter, dirt, coffee grounds, or other unwanted substance. Seal the mixture in a bag or container. Put it in the trash. NOTE: This sheet is a summary. It may not cover all possible information. If you have questions about this medicine, talk to your doctor, pharmacist, or health care provider.  2024 Elsevier/Gold Standard (2023-04-07 00:00:00)

## 2023-11-29 ENCOUNTER — Ambulatory Visit: Payer: Self-pay | Admitting: *Deleted

## 2023-11-29 DIAGNOSIS — E785 Hyperlipidemia, unspecified: Secondary | ICD-10-CM

## 2023-11-29 DIAGNOSIS — I2510 Atherosclerotic heart disease of native coronary artery without angina pectoris: Secondary | ICD-10-CM

## 2023-11-29 DIAGNOSIS — Z79899 Other long term (current) drug therapy: Secondary | ICD-10-CM

## 2023-11-29 DIAGNOSIS — I214 Non-ST elevation (NSTEMI) myocardial infarction: Secondary | ICD-10-CM

## 2023-11-29 LAB — HEPATIC FUNCTION PANEL
ALT: 12 IU/L (ref 0–32)
AST: 24 IU/L (ref 0–40)
Albumin: 4.8 g/dL (ref 3.8–4.8)
Alkaline Phosphatase: 70 IU/L (ref 44–121)
Bilirubin Total: 0.5 mg/dL (ref 0.0–1.2)
Bilirubin, Direct: 0.18 mg/dL (ref 0.00–0.40)
Total Protein: 7.5 g/dL (ref 6.0–8.5)

## 2023-11-29 LAB — LIPID PANEL
Chol/HDL Ratio: 3.9 ratio (ref 0.0–4.4)
Cholesterol, Total: 316 mg/dL — ABNORMAL HIGH (ref 100–199)
HDL: 81 mg/dL (ref >39)
LDL Chol Calc (NIH): 214 mg/dL — ABNORMAL HIGH (ref 0–99)
Triglycerides: 121 mg/dL (ref 0–149)
VLDL Cholesterol Cal: 21 mg/dL (ref 5–40)

## 2023-12-01 ENCOUNTER — Telehealth: Payer: Self-pay | Admitting: Physician Assistant

## 2023-12-01 NOTE — Telephone Encounter (Signed)
 Pt c/o medication issue:  1. Name of Medication:  Leqvio  2. How are you currently taking this medication (dosage and times per day)?   3. Are you having a reaction (difficulty breathing--STAT)?   4. What is your medication issue?  Husband says Amanda [name] advised to contact medicare to see if injection will be covered. However, per husband, medicare advised to contact physician's office. Not sure whether an authorization may be needed. Husband would like to clarify. Please advise.

## 2023-12-03 ENCOUNTER — Other Ambulatory Visit: Payer: Self-pay | Admitting: Family Medicine

## 2023-12-03 DIAGNOSIS — G2581 Restless legs syndrome: Secondary | ICD-10-CM

## 2023-12-04 ENCOUNTER — Encounter: Payer: Self-pay | Admitting: Pharmacist

## 2023-12-04 NOTE — Telephone Encounter (Signed)
 Please review refill request. There is a message regarding it being discontinued.

## 2023-12-04 NOTE — Telephone Encounter (Signed)
 Mychart message sent to patient.

## 2023-12-05 NOTE — Telephone Encounter (Signed)
 Referral to our lipid clinic for pt to see the Pharmacist (for consideration of Leqvio injections) placed in the system.  Will have our scheduler reach out to the pt to arrange this appt.   Will make the pt aware of this via mychart message.

## 2023-12-06 DIAGNOSIS — Z85828 Personal history of other malignant neoplasm of skin: Secondary | ICD-10-CM | POA: Diagnosis not present

## 2023-12-06 DIAGNOSIS — L820 Inflamed seborrheic keratosis: Secondary | ICD-10-CM | POA: Diagnosis not present

## 2023-12-06 DIAGNOSIS — L821 Other seborrheic keratosis: Secondary | ICD-10-CM | POA: Diagnosis not present

## 2023-12-06 NOTE — Telephone Encounter (Signed)
 Confused- a month ago looks like we gave her a years worth of the 0.5 mg as she requested- plesae contact her to check in

## 2023-12-10 NOTE — Progress Notes (Unsigned)
 Patient ID: [name]                 DOB: Dec 05, 1950                    MRN: [id number]      HPI: Amanda [name] is a 73 y.o. female patient referred to lipid clinic by Amanda [name] . PMH is significant for CAD, hx NSTEMI, HLD.   The patient, with a history of statin intolerance, was referred to the lipid clinic for further evaluation. During today's visit, she reported experiencing brain fog and confusion while on Crestor  40 mg, and later 20 mg, with resolution of symptoms upon discontinuation. She is currently off all lipid-lowering therapy but maintains a healthy diet and exercises regularly. We discussed various options for LDL cholesterol reduction, including alternative statins, ezetimibe , PCSK9 inhibitors, bempedoic acid, and inclisiran. The mechanisms of action, dosing, potential side effects, and expected LDL reductions for each therapy were reviewed. We also discussed cost considerations and potential eligibility for patient assistance programs.  Current Medications: none  Intolerances: Crestor  40 mg daily and atorvastatin  20 mg daily  Risk Factors: CAD, hx NSTEMI, HLD. Possible FH given >200 LDL and family hx of ASCVD  LDL goal: <70 mg/dl  Last lab 92/77/7974 TC 316, TG 121, HDL 81, LDLc 214  Diet: heart healthy diet   Exercise: regularly 4-5 times per week   Family History:      Relation Problem Comments  Mother (Deceased at age 79) Failure to thrive 54    Father (Deceased at age 70) Hypertension   Liver cancer alcohol related likely    Sister Metallurgist)   Brother (Alive)   Maternal Aunt (Deceased) Breast cancer (Age: 80)   Diabetes     Maternal Grandmother (Deceased) Heart disease   Hyperlipidemia   Maternal Grandfather (Deceased) Heart disease   Hyperlipidemia     Paternal Grandmother (Deceased)   Paternal Grandfather (Deceased) Hypertension     Son Metallurgist)   Son (Alive)        Labs:  Lipid Panel     Component Value Date/Time   CHOL 316  (H) 11/28/2023 0918   CHOL 315 (H) 05/15/2015 1136   TRIG 121 11/28/2023 0918   TRIG 92 05/15/2015 1136   TRIG 70 04/12/2006 1129   HDL 81 11/28/2023 0918   HDL 84 05/15/2015 1136   CHOLHDL 3.9 11/28/2023 0918   CHOLHDL 3.3 02/03/2023 0416   VLDL 13 02/03/2023 0416   LDLCALC 214 (H) 11/28/2023 0918   LDLCALC 121 (H) 02/14/2020 1603   LDLCALC 213 (H) 05/15/2015 1136   LDLDIRECT 139.2 03/10/2008 1036   LABVLDL 21 11/28/2023 0918    Past Medical History:  Diagnosis Date   Anemia    Anxiety    Celiac disease    Chronic constipation    Coronary artery spasm (HCC)    COVID 02/12/2021   COVID-19 virus vaccine not available    Erythema migrans (Lyme disease) 12/17/2021   GERD (gastroesophageal reflux disease)    occasionally (per pt takes apple cider vinager)   Heart palpitations    cardiologist-- dr anner--- event monitor 08-24-2011 epic ;  nuclear stress test 03-16-2011 (epic) normal w/ no ischemia, ef 65%;  echo 01-18-2013  ef 60-65%, G1DD   Hiatal hernia    History of subdural hematoma    12/ 23/ 2010  s/p  craniotomy w/ hematoma evacuation (pt had a fall w/ concussion)  per pt no residual  History of thyroid  cancer followed by dr jesus---   s/p  left thyroidectomy --- per pt no radiation and no recurrence   Hyperlipidemia    Hypertension    IBS (irritable bowel syndrome)    Immunization, single disease 06/09/2019   1st dose moderma vaccine administered   Insomnia    OA (osteoarthritis)    Osteopenia, T score -2.1 FRAX 9.4%/1.3% stable from prior DEXA 06/2015   Restless leg syndrome    Right thyroid  nodule    followed by dr jesus--- last ultrasound in epic 03-30-2018 stable , to bx   STD (sexually transmitted disease), HSV    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     aspirin  EC 81 MG tablet Take 1 tablet (81 mg total) by mouth daily. Swallow whole. 30 tablet 3   bisacodyl (DULCOLAX) 5 MG EC tablet Take 5 mg by mouth daily as needed for moderate constipation.     calcium  carbonate (CALCIUM  600) 1500 (600 Ca) MG TABS tablet 1 tablet with food Orally Twice a day     CALCIUM  PO Take 2 tablets by mouth daily.     clonazePAM  (KLONOPIN ) 0.5 MG tablet Take 1 tablet (0.5 mg total) by mouth at bedtime as needed for anxiety (sleep). 90 tablet 1   CO ENZYME Q-10 PO Take 1 tablet by mouth daily.     Cyanocobalamin  (VITAMIN B-12 ER PO) Take 1 tablet by mouth daily.     escitalopram  (LEXAPRO ) 10 MG tablet TAKE 1 TABLET BY MOUTH EVERYDAY AT BEDTIME 90 tablet 2   MAGNESIUM PO Take 1 tablet by mouth at bedtime.     Melatonin 3 MG CAPS Take 6 mg by mouth at bedtime.     meloxicam (MOBIC) 15 MG tablet TAKE 1 TABLET A DAY FOR PAIN AND INFLAMMATION     metoprolol  succinate (TOPROL -XL) 25 MG 24 hr tablet TAKE 1 TABLET BY MOUTH EVERY DAY WITH OR IMMEDIATELY FOLLOWING A MEAL 90 tablet 3   Omega 3 1000 MG CAPS 1 capsule.     OVER THE COUNTER MEDICATION Take 1 tablet by mouth at bedtime. Renew Life [place]     OVER THE COUNTER MEDICATION Take 1 tablet by mouth daily. Life extension     Polyethyl Glycol-Propyl Glycol (SYSTANE OP) Place 2 drops into both eyes as needed (dryness).     rOPINIRole  (REQUIP ) 0.25 MG tablet TAKE 1 TABLET BY MOUTH EVERYDAY AT BEDTIME 90 tablet 1   rOPINIRole  (REQUIP ) 0.5 MG tablet Take 1 tablet (0.5 mg total) by mouth at bedtime. 90 tablet 3   Turmeric (QC TUMERIC COMPLEX PO) Take 1 capsule by mouth daily.     valACYclovir  (VALTREX ) 500 MG tablet ONE TAB TWICE A DAY AS NEEDED FOR OUTBREAK 180 tablet 1   Vitamin D -Vitamin K (VITAMIN K2-VITAMIN D3 PO) Take 1 capsule by mouth daily.     No current facility-administered medications on file prior to visit.    Allergies  Allergen Reactions   Ambien [Zolpidem Tartrate] Other (See Comments)     Causes Neurological problems with very bad dizziness, pains, disorientation   Cortisone Other (See Comments)    REACTION: mania post ESI   Eszopiclone And Related Other (See Comments)    Causes Neurological problems with very bad  dizziness, pains, disorientation   Levofloxacin Other (See Comments)    Muscle spasms and jerking movements   Zolpidem Other (See Comments)    Causes Neurological problems with very bad dizziness, pains, disorientation   Dicloxacillin Other (See Comments)    Stomach problems, swelling of tongue   Dilantin [Phenytoin Sodium Extended] Hives and Itching   Erythromycin Other (See Comments)    Tears stomach up   Amlodipine  Other (See Comments)    Head jerks at night   Avelox  [Moxifloxacin  Hcl In Nacl] Other (See Comments)    Pt unsure of reaction.   Dicloxacillin Sodium Other (See Comments)   Eszopiclone Other (See Comments)   Gluten Meal Other (See Comments)   Influenza Virus Vaccine Other (See Comments)    Unable to recall   Isosorbide  Other (See Comments)    Unable to recall- years ago   Loratadine  Other (See Comments)    Unable to recall per pt   Moxifloxacin  Other (See Comments)    Unable to recall   Nefazodone Other (See Comments)    Unable to recall   Nitrofuran Derivatives Other (See Comments)    unknown   Other Other (See Comments)    Celiac disease- Patient denies    Phenytoin Other (See Comments)    Unable to recall- years ago   Phenytoin Sodium Extended Rash   Trazodone Other (See Comments)    Didn't feel good per pt   Trazodone And Nefazodone Other (See Comments)    dizzy    Assessment/Plan:  1. Hyperlipidemia -  Problem  Hyperlipidemia With Target Ldl Less Than 70   Trial rosuvastatin  5 mg June 2021 Atorvastatin  20mg - takes half tablet as mental fog on full dose. Also takes asa 81 mg.   Current Medications: none  Intolerances: Crestor  40 mg daily and atorvastatin  20 mg daily - metal fog/ confusion  Risk Factors: CAD, hx  NSTEMI, HLD  LDL goal: <70 mg/dl  Last lab 92/77/7974 TC 316, TG 121, HDL 81, LDLc 214- while off of lipid lowering agent(s)    Hyperlipidemia with target LDL less than 70 Assessment:  LDL goal: < 70 mg/dl last LDLc 785 mg/dl  Currently not on any lipid lowering agent  Intolerance to statins  Discussed next potential options ( other statins, low dose statins, ezetimibe , PCSK-9 inhibitors, bempedoic acid and inclisiran); cost, dosing efficacy, side effects  Follows heart healthy diet and exercise regularly   Plan: Start taking Crestor  10 gm daily and if not tolerated well reduce dose to 5 mg daily- f/u in 1 month via phone  Will apply for PA for PCSK9i; will inform patient upon approval  Lipid lab due in 2-3 months of therapy modifications     Thank you,  Robbi Blanch, Pharm.D [facility] Elsp[facility]. [facility] [address], [place], [place] [address] Phone: [phone]; Fax: [phone]

## 2023-12-11 ENCOUNTER — Encounter: Payer: Self-pay | Admitting: Family Medicine

## 2023-12-11 ENCOUNTER — Ambulatory Visit (INDEPENDENT_AMBULATORY_CARE_PROVIDER_SITE_OTHER): Admitting: Family Medicine

## 2023-12-11 ENCOUNTER — Encounter: Payer: Self-pay | Admitting: Pharmacist

## 2023-12-11 ENCOUNTER — Ambulatory Visit: Attending: Cardiovascular Disease | Admitting: Pharmacist

## 2023-12-11 ENCOUNTER — Telehealth: Payer: Self-pay | Admitting: Pharmacist

## 2023-12-11 VITALS — BP 136/82 | HR 65 | Ht 66.0 in | Wt 147.0 lb

## 2023-12-11 DIAGNOSIS — I10 Essential (primary) hypertension: Secondary | ICD-10-CM | POA: Diagnosis not present

## 2023-12-11 DIAGNOSIS — E88819 Insulin resistance, unspecified: Secondary | ICD-10-CM | POA: Diagnosis not present

## 2023-12-11 DIAGNOSIS — M8580 Other specified disorders of bone density and structure, unspecified site: Secondary | ICD-10-CM

## 2023-12-11 DIAGNOSIS — I2510 Atherosclerotic heart disease of native coronary artery without angina pectoris: Secondary | ICD-10-CM

## 2023-12-11 DIAGNOSIS — E785 Hyperlipidemia, unspecified: Secondary | ICD-10-CM | POA: Insufficient documentation

## 2023-12-11 DIAGNOSIS — Z131 Encounter for screening for diabetes mellitus: Secondary | ICD-10-CM | POA: Diagnosis not present

## 2023-12-11 MED ORDER — ROSUVASTATIN CALCIUM 10 MG PO TABS: 10.0000 mg | ORAL_TABLET | Freq: Every day | ORAL | 3 refills | Status: DC

## 2023-12-11 NOTE — Patient Instructions (Signed)
 Your Results:             Your most recent labs Goal  Total Cholesterol 316 < 200  Triglycerides 121 < 150  HDL (happy/good cholesterol) 81 > 40  LDL (lousy/bad cholesterol 214 < 70   Medication changes: start taking rosuvastatin  10 mg daily if not tolerated well reduce dose to 5 mg daily. We will start the process to get PCSK9i ( Repatha  or Praluent)  covered by your insurance.  Once the prior authorization is complete, we will call you to let you know and confirm pharmacy information.     Praluent is a cholesterol medication that improved your body's ability to get rid of bad cholesterol known as LDL. It can lower your LDL up to 60%. It is an injection that is given under the skin every 2 weeks. The most common side effects of Praluent include runny nose, symptoms of the common cold, rarely flu or flu-like symptoms, back/muscle pain in about 3-4% of the patients, and redness, pain, or bruising at the injection site.    Repatha  is a cholesterol medication that improved your body's ability to get rid of bad cholesterol known as LDL. It can lower your LDL up to 60%! It is an injection that is given under the skin every 2 weeks. The medication often requires a prior authorization from your insurance company.  The most common side effects of Repatha  include runny nose, symptoms of the common cold, rarely flu or flu-like symptoms, back/muscle pain in about 3-4% of the patients, and redness, pain, or bruising at the injection site.   Lab orders: We want to repeat labs after 2-3 months.  We will send you a lab order to remind you once we get closer to that time.

## 2023-12-11 NOTE — Assessment & Plan Note (Signed)
 Assessment:  LDL goal: < 70 mg/dl last LDLc 785 mg/dl  Currently not on any lipid lowering agent  Intolerance to statins  Discussed next potential options ( other statins, low dose statins, ezetimibe , PCSK-9 inhibitors, bempedoic acid and inclisiran); cost, dosing efficacy, side effects  Follows heart healthy diet and exercise regularly   Plan: Start taking Crestor  10 gm daily and if not tolerated well reduce dose to 5 mg daily- f/u in 1 month via phone  Will apply for PA for PCSK9i; will inform patient upon approval  Lipid lab due in 2-3 months of therapy modifications

## 2023-12-11 NOTE — Progress Notes (Signed)
 Phone [phone] In person visit   Subjective:   Amanda [name] is a 73 y.o. 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/01/2018    Priority: High   Thyroid  nodule 03/27/2018    Priority: High   Celiac disease 11/27/2007    Priority: High   Insomnia 07/05/2007    Priority: High   History of thyroid  cancer 10/03/2006    Priority: High   Essential hypertension 02/28/2022    Priority: Medium    Dysphagia 12/23/2019    Priority: Medium    History of subdural hematoma 03/26/2019    Priority: Medium    Herpes simplex type 1 infection 11/01/2018    Priority: Medium    Hyperlipidemia with target LDL less than 70 03/30/2018    Priority: Medium    Fuchs' corneal dystrophy, 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    Priority: Medium    Insulin resistance 03/29/2018    Priority: Medium    Grade I diastolic dysfunction 03/29/2018    Priority: Medium    Dysthymia 09/26/2016    Priority: Medium    Palpitations 01/17/2013    Priority: Medium    GERD (gastroesophageal reflux disease)     Priority: Medium    Restless leg syndrome, on Requip      Priority: Medium    IBS (irritable bowel syndrome) 02/16/2009    Priority: Medium    Osteoarthritis 07/05/2007    Priority: Medium    Osteopenia 10/03/2006    Priority: Medium    Deviated nasal septum 07/29/2022    Priority: Low   Chronic constipation 05/18/2021    Priority: Low   Elevated blood pressure reading 03/06/2020    Priority: Low   Mastalgia, left, followed by GYN 03/29/2018    Priority: Low   History of hysterectomy  03/29/2018    Priority: Low   Presbycusis of both ears 03/10/2017    Priority: Low   Allergic rhinitis 06/25/2009    Priority: Low   Diaphragmatic hernia 02/16/2009    Priority: Low   Left carotid bruit 10/03/2006    Priority: Low   Polyarthralgia 03/02/2023    Priority: 1.   Degeneration of lumbar intervertebral disc 09/07/2022    Priority: 1.   Pain in right hand 04/26/2022    Priority: 1.   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   Pain in left knee 09/24/2019    Priority: 1.   NSTEMI (non-ST elevated myocardial infarction) (HCC) 02/02/2023    Medications- reviewed and updated Current Outpatient Medications  Medication Sig Dispense Refill   APPLE CIDER VINEGAR PO Take 1 tablet by mouth in the morning, at noon, and at bedtime.     bisacodyl (DULCOLAX) 5 MG EC tablet Take 5 mg by mouth daily as needed for moderate constipation.     calcium  carbonate (CALCIUM  600) 1500 (600 Ca) MG TABS tablet 1 tablet with food Orally Twice a day     CALCIUM  PO Take 2 tablets by mouth daily.     clonazePAM  (KLONOPIN ) 0.5 MG tablet Take 1 tablet (0.5  mg total) by mouth at bedtime as needed for anxiety (sleep). 90 tablet 1   CO ENZYME Q-10 PO Take 1 tablet by mouth daily.     Cyanocobalamin  (VITAMIN B-12 ER PO) Take 1 tablet by mouth daily.     escitalopram  (LEXAPRO ) 10 MG tablet TAKE 1 TABLET BY MOUTH EVERYDAY AT BEDTIME 90 tablet 2   MAGNESIUM PO Take 1 tablet by mouth at bedtime.     meloxicam (MOBIC) 15 MG tablet TAKE 1 TABLET A DAY FOR PAIN AND INFLAMMATION     metoprolol  succinate (TOPROL -XL) 25 MG 24 hr tablet TAKE 1 TABLET BY MOUTH EVERY DAY WITH OR IMMEDIATELY FOLLOWING A MEAL 90 tablet 3   Omega 3 1000 MG CAPS 1 capsule.     OVER THE COUNTER MEDICATION Take 1 tablet by mouth daily. Life extension     Polyethyl Glycol-Propyl Glycol (SYSTANE OP) Place 2 drops into both eyes as needed (dryness).     rOPINIRole  (REQUIP ) 0.25 MG tablet TAKE 1 TABLET BY MOUTH EVERYDAY AT  BEDTIME 90 tablet 1   rOPINIRole  (REQUIP ) 0.5 MG tablet Take 1 tablet (0.5 mg total) by mouth at bedtime. 90 tablet 3   rosuvastatin  (CRESTOR ) 10 MG tablet Take 1 tablet (10 mg total) by mouth daily. 90 tablet 3   Turmeric (QC TUMERIC COMPLEX PO) Take 1 capsule by mouth daily.     valACYclovir  (VALTREX ) 500 MG tablet ONE TAB TWICE A DAY AS NEEDED FOR OUTBREAK 180 tablet 1   Vitamin D -Vitamin K (VITAMIN K2-VITAMIN D3 PO) Take 1 capsule by mouth daily.     No current facility-administered medications for this visit.     Objective:  BP 136/82   Pulse 65   Ht 5' 6 (1.676 m)   Wt 147 lb (66.7 kg)   LMP  (LMP Unknown)   SpO2 96%   BMI 23.73 kg/m  Gen: NAD, resting comfortably CV: RRR no murmurs rubs or gallops Lungs: CTAB no crackles, wheeze, rhonchi Ext: no edema Skin: warm, dry MSK: Pain with palpation over medial epicondyle along right elbow.  Pain also worsens with pronation    Assessment and Plan    # Emergency department follow-up for chest pain S: Patient was seen in the emergency department 11/27/2023 with sharp left-sided chest pain (starting in left shoulder and going down into left chest) lasting several seconds when she woke up.  Otherwise largely asymptomatic.  Vital signs were stable.  EKG without STEMI troponins x 2 with no increasing trend.  ACS considered unlikely.  PE risk was low based on Wells criteria plus no tachycardia or hypoxia.  Chest x-ray without pneumonia or pneumothorax.  CBC and BMP largely reassuring.  They also reflected back on echocardiogram with normal ejection fraction from 2024.  Also of note had largely reassuring cardiac catheterization and follow-up 2024 with 30% blockage of circumflex artery with other arteries being clear. -sugar was 143 but was not fasting- we discussed that can be considered normal especially for prediabetes   She had cardiology follow-up on 11/28/2023 and they believed chest pain to be noncardiac.  They also discussed her  hyperlipidemia as she felt she was having issues with tolerating rosuvastatin  due to brain fog and  provided materials to consider Repatha , Leqvio, Nexletol.  Had visit with cardiology pharmacy team earlier today-they opted to restart rosuvastatin  10 mg daily but try to see coverage for Repatha  or Praluent with labs 2 to 3 months after change  A/P: We discussed reassuring workup for chest  pain in fact this was not considered cardiac by the emergency department or cardiology directly.  Could have been referred pain from the shoulder.  She can return if recurrent but we can monitor for now.  History of hiatal hernia as well which could contribute  # Right sided golfers elbow-tenderness to palpation of medial epicondyle with palpation.  Also with pronation.  Encouraged counterforce brace and gave home exercises-she agrees to let me know if fails to improve and would refer to sports medicine or [facility] who she has seen in the past  # Right thigh pain-nodular area still present but MRI was reassuring and pain has resolved-wants to monitor only #hypertension with white coat element-new diagnosis 02/28/2022 S: medication: Metoprolol  50 mg--> 25mg  extended release (on at baseline for palpitations) A/P: Blood pressure well-controlled-continue current medication   # Nonobstructive CAD-minimal nonobstructive disease per Dr. Anner.  Had elevated troponin and concern was for NSTEMI initially. #hyperlipidemia-peak LDL of 187 S: Medication:  aspirin  81 mg -Memory issues and myalgias on Crestor -referred to lipid clinic and they are actively trying to apply for Repatha  or Praluent  A/P: Nonobstructive CAD noted-I do not think her symptoms were related to this.  Continue current medication.  Hopefully she can get on Repatha  or Praluent soon to help with her cholesterol since she is not tolerating brain fog with rosuvastatin    # Hyperglycemia/insulin resistance/prediabetes-peak A1c 5.8 S:  Medication:  None Exercise and diet-strength trains and walks regularly-she feels very strong Lab Results  Component Value Date   HGBA1C 5.8 07/25/2023   HGBA1C 5.7 (H) 02/02/2023   HGBA1C 5.7 08/02/2022   A/P: Prediabetes noted-continue work on healthy eating/regular exercise and recheck September 18 or later   # Low Bone density (formerly osteopenia)-follows with Dr. Glennon of GYN S: Last DEXA: Worst T-score -2.13 May 2023 with elevated osteoporotic fracture and hip fracture risk above 20 and 3%  -Not the best candidate for Fosamax with reflux history  -With possible coronary spasms has not been prescribed Evenity  -Prolia has been considered but she has wanted to hold off so far  Calcium : 1200mg  (through diet ok) recommended -yes Vitamin D : 1000 units a day recommended-yes -Continues weightbearing exercises well A/P: Osteopenia based on T-score but osteoporosis based on fracture risk-encouraged her to consider Prolia-she is going to take this under consideration   Recommended follow up: Return in about 6 months (around 06/12/2024). Future Appointments  Date Time Provider Department Center  01/16/2024  1:00 PM [facility] PREVISIT RM 52 [facility]  01/30/2024  8:00 AM Armbruster, Elspeth SQUIBB, MD [facility]  06/17/2024 10:20 AM Katrinka Garnette KIDD, MD [facility]    Lab/Order associations:   ICD-10-CM   1. Essential hypertension  I10 Comprehensive metabolic panel with GFR    2. Osteopenia, unspecified location  M85.80     3. Hyperlipidemia with target LDL less than 70  E78.5     4. Coronary artery disease, non-occlusive  I25.10     5. Insulin resistance  E88.819 Hemoglobin A1c    6. Screening for diabetes mellitus  Z13.1 Hemoglobin A1c      No orders of the defined types were placed in this encounter.   Return precautions advised.  Garnette Katrinka, MD

## 2023-12-11 NOTE — Patient Instructions (Addendum)
 Glad you are feeling better  Team please give her exercises for golfers elbow I want you to do the exercise 3x a week for a month then once a week for another month. Stop any exercise that causes more than 1-2/10 pain increase. If not doing better within 1-2 months let us  refer you to sports medicine or see [facility]   Schedule labs September 18th or later to recheck kidney, liver, sugar including a1c  Recommended follow up: schedule visit 6 months out form September 18th as well to see me

## 2023-12-12 ENCOUNTER — Telehealth: Payer: Self-pay

## 2023-12-12 ENCOUNTER — Other Ambulatory Visit (HOSPITAL_COMMUNITY): Payer: Self-pay

## 2023-12-12 DIAGNOSIS — E785 Hyperlipidemia, unspecified: Secondary | ICD-10-CM

## 2023-12-12 NOTE — Telephone Encounter (Signed)
 Pharmacy Patient Advocate Encounter  Received notification from [employer] that Prior Authorization for REPATHA  has been APPROVED from 12/12/23 to 06/13/24. Ran test claim, Copay is $47. This test claim was processed through [facility]- copay amounts may vary at other pharmacies due to pharmacy/plan contracts, or as the patient moves through the different stages of their insurance plan.

## 2023-12-12 NOTE — Telephone Encounter (Signed)
 Pharmacy Patient Advocate Encounter   Received notification from Physician's Office that prior authorization for REPATHA  is required/requested.   Insurance verification completed.   The patient is insured through [employer] [facility] [employer] .   Per test claim: PA required; PA submitted to above mentioned insurance via CoverMyMeds Key/confirmation #/EOC [facility] Status is pending

## 2023-12-12 NOTE — Telephone Encounter (Signed)
 PA request has been Submitted. New Encounter has been or will be created for follow up. For additional info see Pharmacy Prior Auth telephone encounter from 12/12/23.

## 2023-12-13 ENCOUNTER — Other Ambulatory Visit (HOSPITAL_COMMUNITY): Payer: Self-pay

## 2023-12-13 MED ORDER — REPATHA SURECLICK 140 MG/ML ~~LOC~~ SOAJ
140.0000 mg | SUBCUTANEOUS | 3 refills | Status: AC
  Filled 2023-12-13 (×2): qty 6, 84d supply, fill #0
  Filled 2024-03-02: qty 6, 84d supply, fill #1
  Filled 2024-05-13: qty 6, 84d supply, fill #2

## 2023-12-13 NOTE — Addendum Note (Signed)
 Addended by: Wynonna Fitzhenry K on: 12/13/2023 08:07 AM   Modules accepted: Orders

## 2023-12-14 ENCOUNTER — Other Ambulatory Visit (HOSPITAL_COMMUNITY): Payer: Self-pay

## 2023-12-28 ENCOUNTER — Ambulatory Visit: Admitting: Gastroenterology

## 2023-12-28 ENCOUNTER — Telehealth: Payer: Self-pay | Admitting: Pharmacist

## 2023-12-28 ENCOUNTER — Encounter: Payer: Self-pay | Admitting: Gastroenterology

## 2023-12-28 VITALS — BP 124/70 | HR 68 | Ht 64.5 in | Wt 147.5 lb

## 2023-12-28 DIAGNOSIS — R1310 Dysphagia, unspecified: Secondary | ICD-10-CM

## 2023-12-28 DIAGNOSIS — R194 Change in bowel habit: Secondary | ICD-10-CM

## 2023-12-28 DIAGNOSIS — R140 Abdominal distension (gaseous): Secondary | ICD-10-CM

## 2023-12-28 MED ORDER — CITRUCEL PO POWD: 1.0000 | Freq: Every day | ORAL | Status: AC

## 2023-12-28 NOTE — Patient Instructions (Addendum)
 Please purchase the following medications over the counter and take as directed: Citrucel or Benefiber- take once daily as directed      Avoid high trigger foods.  We have given you samples of the following medication to take: FODZYMES use as directed  You have been scheduled for an endoscopy. Please follow written instructions given to you at your visit today.  If you use inhalers (even only as needed), please bring them with you on the day of your procedure.  If you take any of the following medications, they will need to be adjusted prior to your procedure:   DO NOT TAKE 7 DAYS PRIOR TO TEST- Trulicity (dulaglutide) Ozempic, Wegovy (semaglutide) Mounjaro (tirzepatide) Bydureon Bcise (exanatide extended release)  DO NOT TAKE 1 DAY PRIOR TO YOUR TEST Rybelsus (semaglutide) Adlyxin (lixisenatide) Victoza (liraglutide) Byetta (exanatide) _________________________________________________________________________   Thank you for entrusting me with your care and for choosing [facility], Dr. Elspeth Naval    _______________________________________________________  If your blood pressure at your visit was 140/90 or greater, please contact your primary care physician to follow up on this.  _______________________________________________________  If you are age 98 or older, your body mass index should be between 23-30. Your Body mass index is 24.93 kg/m. If this is out of the aforementioned range listed, please consider follow up with your Primary Care Provider.  If you are age 61 or younger, your body mass index should be between 19-25. Your Body mass index is 24.93 kg/m. If this is out of the aformentioned range listed, please consider follow up with your Primary Care Provider.   ________________________________________________________  The [facility] providers would like to encourage you to use MYCHART to communicate with providers for non-urgent requests or  questions.  Due to long hold times on the telephone, sending your provider a message by [facility] may be a faster and more efficient way to get a response.  Please allow 48 business hours for a response.  Please remember that this is for non-urgent requests.  _______________________________________________________  [facility] is using a team-based approach to care.  Your team is made up of your doctor and two to three APPS. Our APPS (Nurse Practitioners and Physician Assistants) work with your physician to ensure care continuity for you. They are fully qualified to address your health concerns and develop a treatment plan. They communicate directly with your gastroenterologist to care for you. Seeing the Advanced Practice Practitioners on your physician's team can help you by facilitating care more promptly, often allowing for earlier appointments, access to diagnostic testing, procedures, and other specialty referrals.

## 2023-12-28 NOTE — Telephone Encounter (Signed)
 Pt Amanda [name] reporting she had some symptoms after taking her 1st dose of Repatha . Called back. She had tiredness with some confusion post injection for day or 2. Assured patient these are not concerning s/e so she will continue with Repatha 

## 2023-12-28 NOTE — Progress Notes (Signed)
 HPI :  73 year old female with a reported remote history of celiac disease, IBS, GERD, dysphagia, here for a follow-up visit.  Most recently saw her for surveillance colonoscopy in July.  She had a few small polyps removed.  She had some melanosis coli noted from prior laxative use.  Otherwise no concerning findings.  She has normally constipation at baseline, was using smooth move for a while/herbal supplementation, she stopped it about a month before the colonoscopy.  She has had some altered bowel habits since then.  She has not really taken much of anything lately.  She is having roughly 3 bowel movements per day on average, mostly formed.  She shows me pictures of formed brown stool today.  She states her stools are typically lighter than that but they appear pretty normal in the picture without blood.  She has sense of incomplete evacuation and multiple bowel movements to evacuate herself at times.  She also has some bloating and distention of her abdomen after eating.  She eats a lot of salads and yogurt.  She does not have any recent dietary changes to be correlating to her symptoms or any recent changes in her medicines other than Repatha .  Otherwise, recall she has had an EGD with dilation in the past for dysphagia.  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  She had savory dilation at the last visit which caused mucosal rent at the GEJ state.  She states this worked to help her dysphagia at the time however over time her symptoms have recurred.  She is having dysphagia to some foods and pills.  She has been using apple cider vinegar about every other day for reflux.  She declines Pepcid  or PPIs, she does not like taking medications.  She is scheduled for repeat EGD and dilation in September.      Procedural history: EGD in September 2021 by Dr. Jenkins Minor through [facility].  This was performed for complaints of  pharyngeal dysphagia.  I could not feel the actual report, but I can see that duodenal biopsies were taken and showed preserved villoglandular architecture without increased intraepithelial lymphocytes and active inflammation.  Gastric biopsies showed mild chronic inflammation without H. pylori.  Distal esophageal biopsies showed esophageal squamous mucosa with basal cell hyperplasia and reparative changes with no eosinophils identified.  Proximal esophageal biopsies showed the same. Colonoscopy 11/2018-- One 5 mm polyp in the cecum, removed with a cold snare. Resected and retrieved.  One 4 to 5 mm polyp in the rectum, removed with a hot snare. Resected and retrieved.  Tattoo placed.  The examination was otherwise normal.  Cecal polyp was a sessile serrated polyp and the one from the rectum was a lipoma. repeat colonoscopy in 5 years. Modified barium swallow 03/2016 - no aspiration, mild cricopharyngeal bar EGD 06/09/15 - normal esophagus, no hiatal hernia, showed mild duodenal bulb erythema but biopsies of the 2nd portion of the duodenum and bulb showed no evidence of active celiac.  Colonoscopy 06/09/15 - 2 polyps removed on colonoscopy, the largest a 1cm sessile serrated polyp in the right colon, and another smaller adenoma. biopsies of the colon were taken and no evidence for microscopic colitis. EGD - 07/25/2011 - normal , bx negative for EoE Esophageal manometry 08/04/2010 - reportedly normal per note of Dr Jakie EGD 11/30/2009 - normal esophagus, biopsies taken to rule out EoE - path negative for reflux Colonoscopy 08/2007 -  normal EGD 08/2007 - biopsies taken to rule out EoE, hiatal hernia - path negative for EoE     EGD 07/06/21: Esophagogastric landmarks were identified: the Z-line was found at 37 cm, the gastroesophageal junction was found at 37 cm and the upper extent of the gastric folds was found at 37 cm from the incisors. Sliding 1cm hiatal hernia. - One benign-appearing, intrinsic mild  stenosis was found 37 cm from the incisors. This stenosis measured less than one cm (in length). A guidewire was placed and the scope was withdrawn. Dilation was performed with a Savary dilator with mild resistance at 17 mm and 18 mm. Relook endoscopy showed an appropriate mucosal wrent at the stricture. No mucosal wrents near the proximal esophagus. - The exam of the esophagus was otherwise normal. - The entire examined stomach was normal. - The duodenal bulb and second portion of the duodenum were normal other than mild erythema of the duodenal bulb and diminutive AVM in second portion of the duodenum.   Colonoscopy 11/14/23: - Skin tags were found on perianal exam. - A 3 mm polyp was found in the ascending colon. The polyp was flat. The polyp was removed with a cold snare. Resection and retrieval were complete. - A 3 mm polyp was found in the descending colon. The polyp was sessile. The polyp was removed with a cold snare. Resection and retrieval were complete. - A 3 mm polyp was found is [place] in the sigmoid colon. The polyp was sessile. The polyp was removed with a cold snare. Resection and retrieval were complete. 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 - Internal hemorrhoids were found during retroflexion. The hemorrhoids were small. - The colon was tortous. The exam was otherwise without abnormality.   FINAL DIAGNOSIS        1. Surgical [P], colon, sigmoid and descending, polyp (2) :       - HYPERPLASTIC POLYPS (2).        2. Surgical [P], colon, ascending, polyp (1) :       - SESSILE SERRATED POLYP WITHOUT CYTOLOGIC DYSPLASIA.    Past Medical History:  Diagnosis Date   Anemia    Anxiety    Celiac disease    Chronic constipation    Coronary artery spasm (HCC)    COVID 02/12/2021   COVID-19 virus vaccine not available    Erythema migrans (Lyme disease) 12/17/2021   GERD (gastroesophageal reflux disease)    occasionally (per pt takes apple cider vinager)    Heart palpitations    cardiologist-- dr anner--- event monitor 08-24-2011 epic ;  nuclear stress test 03-16-2011 (epic) normal w/ no ischemia, ef 65%;  echo 01-18-2013  ef 60-65%, G1DD   Hiatal hernia    History of subdural hematoma    12/ 23/ 2010  s/p  craniotomy w/ hematoma evacuation (pt had a fall w/ concussion)  per pt no residual   History of thyroid  cancer followed by dr jesus---   s/p  left thyroidectomy --- per pt no radiation and no recurrence   Hyperlipidemia    Hypertension    IBS (irritable bowel syndrome)    Immunization, single disease 06/09/2019   1st dose moderma vaccine administered   Insomnia    OA (osteoarthritis)    Osteopenia, T score -2.1 FRAX 9.4%/1.3% stable from prior DEXA 06/2015   Restless leg syndrome    Right thyroid  nodule    followed by dr jesus--- last ultrasound in epic 03-30-2018 stable , to  bx   STD (sexually transmitted disease), HSV    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 Normal EF.AB::   REDUCTION MAMMAPLASTY Bilateral    SUBDURAL HEMATOMA EVACUATION VIA CRANIOTOMY  04-30-2009  @[facility]    left frontotemporapartietal    THYROID  LOBECTOMY Left 05-04-2001  @[facility]   dr jesus   TONSILLECTOMY     TRANSTHORACIC ECHOCARDIOGRAM  02/03/2023   EF 60 to 65%.  Normal function.  No RWMA.  GR 1 DD normal RV.  Normal PAP.  Normal valves.  Normal pressures.   VULVECTOMY N/A 03/12/2019   Procedure: WIDE LOCAL  EXCISION OF CARCINOMA IS SITU OF VULVA;  Surgeon: Rockney Evalene SQUIBB, MD;  Location: [facility];  Service: Gynecology;  Laterality: N/A;  request to follow in [facility] block time requests one hour colposcope available in OR with acetic acid .   VULVECTOMY N/A 06/17/2019   Procedure: WIDE LOCAL  EXCISION VULVECTOMY;  Surgeon: Eloy Herring, MD;  Location: [facility];  Service: Gynecology;  Laterality: N/A;   Family History  Problem Relation Age of Onset   Failure to thrive Mother        45   Liver cancer Father        alcohol related likely   Hypertension Father    Diabetes Maternal Aunt    Breast cancer Maternal Aunt 60   Heart disease Maternal Grandmother    Heart disease Maternal Grandfather    Hyperlipidemia Maternal Grandfather    Hypertension Paternal Grandfather    Colon cancer Neg Hx    Thyroid  disease Neg Hx    Colon polyps Neg Hx    Esophageal cancer Neg Hx    Rectal cancer Neg Hx    Stomach cancer Neg Hx    Social History   Tobacco Use   Smoking status: Never   Smokeless tobacco: Never  Vaping Use   Vaping status: Never Used  Substance Use Topics   Alcohol use: Yes    Comment: once a week   Drug use: Never   Current Outpatient Medications  Medication Sig Dispense Refill   APPLE CIDER VINEGAR PO Take 1 tablet by mouth in the morning, at noon, and at bedtime.     bisacodyl (DULCOLAX) 5 MG EC tablet Take 5 mg by mouth daily as needed for moderate constipation.     calcium  carbonate (CALCIUM  600) 1500 (600 Ca) MG TABS tablet 1 tablet with food Orally Twice a day     CALCIUM  PO Take 2 tablets by mouth daily.     clonazePAM  (KLONOPIN ) 0.5 MG tablet Take 1 tablet (0.5 mg total) by mouth at bedtime as needed for anxiety (sleep). 90 tablet 1   CO ENZYME Q-10 PO Take 1 tablet by mouth daily.     Cyanocobalamin  (VITAMIN B-12 ER PO) Take 1 tablet by mouth daily.     escitalopram  (LEXAPRO ) 10 MG tablet TAKE 1 TABLET BY  MOUTH EVERYDAY AT  BEDTIME 90 tablet 2   Evolocumab  (REPATHA  SURECLICK) 140 MG/ML SOAJ Inject 140 mg into the skin every 14 (fourteen) days. 6 mL 3   MAGNESIUM PO Take 1 tablet by mouth at bedtime.     meloxicam (MOBIC) 15 MG tablet TAKE 1 TABLET A DAY FOR PAIN AND INFLAMMATION     metoprolol  succinate (TOPROL -XL) 25 MG 24 hr tablet TAKE 1 TABLET BY MOUTH EVERY DAY WITH OR IMMEDIATELY FOLLOWING A MEAL 90 tablet 3   nitroGLYCERIN  (NITROSTAT ) 0.4 MG SL tablet Place 0.4 mg under the tongue every 5 (five) minutes as needed.     Omega 3 1000 MG CAPS 1 capsule.     OVER THE COUNTER MEDICATION Take 1 tablet by mouth daily. Life extension     Polyethyl Glycol-Propyl Glycol (SYSTANE OP) Place 2 drops into both eyes as needed (dryness).     rOPINIRole  (REQUIP ) 0.25 MG tablet TAKE 1 TABLET BY MOUTH EVERYDAY AT BEDTIME 90 tablet 1   rosuvastatin  (CRESTOR ) 10 MG tablet Take 1 tablet (10 mg total) by mouth daily. 90 tablet 3   tretinoin (RETIN-A) 0.025 % cream Apply 1 Application topically at bedtime.     Turmeric (QC TUMERIC COMPLEX PO) Take 1 capsule by mouth daily.     valACYclovir  (VALTREX ) 500 MG tablet ONE TAB TWICE A DAY AS NEEDED FOR OUTBREAK 180 tablet 1   Vitamin D -Vitamin K (VITAMIN K2-VITAMIN D3 PO) Take 1 capsule by mouth daily.     No current facility-administered medications for this visit.   Allergies  Allergen Reactions   Ambien [Zolpidem Tartrate] Other (See Comments)    Causes Neurological problems with very bad dizziness, pains, disorientation   Cortisone Other (See Comments)    REACTION: mania post ESI   Eszopiclone And Related Other (See Comments)    Causes Neurological problems with very bad dizziness, pains, disorientation   Levofloxacin Other (See Comments)    Muscle spasms and jerking movements   Zolpidem Other (See Comments)    Causes Neurological problems with very bad dizziness, pains, disorientation   Dicloxacillin Other (See Comments)    Stomach problems, swelling of tongue   Dilantin  [Phenytoin Sodium Extended] Hives and Itching   Erythromycin Other (See Comments)    Tears stomach up   Amlodipine  Other (See Comments)    Head jerks at night   Avelox  [Moxifloxacin  Hcl In Nacl] Other (See Comments)    Pt unsure of reaction.   Dicloxacillin Sodium Other (See Comments)   Eszopiclone Other (See Comments)   Gluten Meal Other (See Comments)   Influenza Virus Vaccine Other (See Comments)    Unable to recall   Isosorbide  Other (See Comments)    Unable to recall- years ago   Loratadine  Other (See Comments)    Unable to recall per pt   Moxifloxacin  Other (See Comments)    Unable to recall   Nefazodone Other (See Comments)    Unable to recall   Nitrofuran Derivatives Other (See Comments)    unknown   Other Other (See Comments)    Celiac disease- Patient denies    Phenytoin Other (See Comments)    Unable to recall- years ago   Phenytoin Sodium Extended Rash   Trazodone Other (See Comments)    Didn't feel good per pt   Trazodone And Nefazodone Other (See Comments)    dizzy     Review of Systems: All systems reviewed and negative except where noted in HPI.   Lab Results  Component Value Date   WBC 4.6 11/27/2023   HGB 12.1 11/27/2023   HCT 37.0 11/27/2023   MCV 91.8 11/27/2023   PLT 231 11/27/2023    Lab Results  Component Value Date   NA 138 11/27/2023   CL 102 11/27/2023   K 4.1 11/27/2023   CO2 23 11/27/2023   BUN 18 11/27/2023   CREATININE 1.02 (H) 11/27/2023   GFRNONAA 58 (L) 11/27/2023   CALCIUM  9.7 11/27/2023   ALBUMIN 4.8 11/28/2023   GLUCOSE 143 (H) 11/27/2023    Lab Results  Component Value Date   ALT 12 11/28/2023   AST 24 11/28/2023   ALKPHOS 70 11/28/2023   BILITOT 0.5 11/28/2023    Physical Exam: BP 124/70 (BP Location: Left Arm, Patient Position: Sitting, Cuff Size: Normal)   Pulse 68   Ht 5' 4.5 (1.638 m) Comment: height measured without shoes  Wt 147 lb 8 oz (66.9 kg)   LMP  (LMP Unknown)   BMI 24.93 kg/m   Constitutional: Pleasant,well-developed, female in no acute distress. Neurological: Alert and oriented to person place and time. Psychiatric: Normal mood and affect. Behavior is normal.   ASSESSMENT: 73 y.o. female here for assessment of the following  1. Altered bowel habits   2. Bloating   3. Dysphagia, unspecified type    Change in stool form slightly and frequency with sense of incomplete evacuation in recent months.  Colonoscopy done recently and looks okay without any concerning findings.  She also has intestinal bloating and gas.  She does have a remote history of celiac disease, I do not see where she was diagnosed with this and when.  Biopsies of her small bowel and recent exams have looked okay and she has had normal TTG IgA's in the past.  Discussed options, recommend Citrucel or Benefiber daily to provide some regularity, I think this will help her.  But avoid specifically Metamucil as this can make bloating and gas worse.  We discussed dietary options in regards to her bloating and gas.  Gave her some handouts on the low FODMAP diet just to give her a sense of what some high risk foods are for gas and bloating and try to avoid those.  I also gave her some samples of FODZYME supplements to use to see if that will help as well.  In regards to her dysphagia, she has had benefit with dilation in the past and is scheduled for repeat EGD with dilation at the [facility] in September.  We will also reevaluate her celiac disease at that time and can take biopsies of the small bowel to assess for activity.  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  We discussed risk benefits of the procedure and anesthesia and she wants to proceed.   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

## 2024-01-12 MED ORDER — ROSUVASTATIN CALCIUM 5 MG PO TABS: 5.0000 mg | ORAL_TABLET | Freq: Every day | ORAL | 3 refills | Status: AC

## 2024-01-12 NOTE — Telephone Encounter (Signed)
 Call to f/u on Repatha  and Crestor . She is doing well on Repatha  but she thinks her memory getting affected by Crestor  10 mg dose. Advised to hold it for week and try 5 mg daily or even 5 mg every other day.

## 2024-01-12 NOTE — Addendum Note (Signed)
 Addended by: Moishe Schellenberg K on: 01/12/2024 04:11 PM   Modules accepted: Orders

## 2024-01-16 ENCOUNTER — Encounter

## 2024-01-17 DIAGNOSIS — H26493 Other secondary cataract, bilateral: Secondary | ICD-10-CM | POA: Diagnosis not present

## 2024-01-17 DIAGNOSIS — H18513 Endothelial corneal dystrophy, bilateral: Secondary | ICD-10-CM | POA: Diagnosis not present

## 2024-01-17 DIAGNOSIS — H18523 Epithelial (juvenile) corneal dystrophy, bilateral: Secondary | ICD-10-CM | POA: Diagnosis not present

## 2024-01-17 DIAGNOSIS — Z961 Presence of intraocular lens: Secondary | ICD-10-CM | POA: Diagnosis not present

## 2024-01-17 DIAGNOSIS — H04129 Dry eye syndrome of unspecified lacrimal gland: Secondary | ICD-10-CM | POA: Diagnosis not present

## 2024-01-30 ENCOUNTER — Ambulatory Visit (AMBULATORY_SURGERY_CENTER): Admitting: Gastroenterology

## 2024-01-30 ENCOUNTER — Encounter: Payer: Self-pay | Admitting: Gastroenterology

## 2024-01-30 VITALS — BP 115/68 | HR 61 | Temp 97.7°F | Resp 18 | Ht 64.0 in | Wt 147.0 lb

## 2024-01-30 DIAGNOSIS — R1310 Dysphagia, unspecified: Secondary | ICD-10-CM | POA: Diagnosis not present

## 2024-01-30 DIAGNOSIS — K900 Celiac disease: Secondary | ICD-10-CM

## 2024-01-30 DIAGNOSIS — K449 Diaphragmatic hernia without obstruction or gangrene: Secondary | ICD-10-CM

## 2024-01-30 DIAGNOSIS — F419 Anxiety disorder, unspecified: Secondary | ICD-10-CM | POA: Diagnosis not present

## 2024-01-30 DIAGNOSIS — E785 Hyperlipidemia, unspecified: Secondary | ICD-10-CM | POA: Diagnosis not present

## 2024-01-30 DIAGNOSIS — R140 Abdominal distension (gaseous): Secondary | ICD-10-CM | POA: Diagnosis not present

## 2024-01-30 DIAGNOSIS — K222 Esophageal obstruction: Secondary | ICD-10-CM

## 2024-01-30 DIAGNOSIS — I10 Essential (primary) hypertension: Secondary | ICD-10-CM | POA: Diagnosis not present

## 2024-01-30 MED ORDER — SODIUM CHLORIDE 0.9 % IV SOLN: 500.0000 mL | Freq: Once | INTRAVENOUS | Status: AC

## 2024-01-30 NOTE — Progress Notes (Signed)
 [facility] History and Physical   Primary Care Physician:  Katrinka Garnette KIDD, MD   Reason for Procedure:   dysphagia  Plan:    EGD with dilation     HPI: Amanda [name] is a 73 y.o. female  here for EGD with dilation for dysphagia. LAst EGD 06/2021 showed a distal esophageal stricture that was dilated. Also has a history of celiac disease. EGD to survey that as well.    Otherwise feels well without any cardiopulmonary symptoms.   I have discussed risks / benefits of anesthesia and endoscopic procedure with [name] and they wish to proceed with the exams as outlined today.    Past Medical History:  Diagnosis Date   Anemia    Anxiety    Celiac disease    Chronic constipation    Coronary artery spasm    COVID 02/12/2021   COVID-19 virus vaccine not available    Erythema migrans (Lyme disease) 12/17/2021   GERD (gastroesophageal reflux disease)    occasionally (per pt takes apple cider vinager)   Heart palpitations    cardiologist-- dr anner--- event monitor 08-24-2011 epic ;  nuclear stress test 03-16-2011 (epic) normal w/ no ischemia, ef 65%;  echo 01-18-2013  ef 60-65%, G1DD   Hiatal hernia    History of subdural hematoma    12/ 23/ 2010  s/p  craniotomy w/ hematoma evacuation (pt had a fall w/ concussion)  per pt no residual   History of thyroid  cancer followed by dr jesus---   s/p  left thyroidectomy --- per pt no radiation and no recurrence   Hyperlipidemia    Hypertension    IBS (irritable bowel syndrome)    Immunization, single disease 06/09/2019   1st dose moderma vaccine administered   Insomnia    OA (osteoarthritis)    Osteopenia, T score -2.1 FRAX 9.4%/1.3% stable from prior DEXA 06/2015   Restless leg syndrome    Right thyroid  nodule    followed by dr jesus--- last ultrasound in epic 03-30-2018 stable , to bx   STD (sexually transmitted disease), HSV    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 Normal EF.AB::   REDUCTION MAMMAPLASTY Bilateral    SUBDURAL HEMATOMA EVACUATION VIA CRANIOTOMY  04-30-2009  @[facility]    left frontotemporapartietal    THYROID  LOBECTOMY Left 05-04-2001  @[facility]   dr jesus   TONSILLECTOMY     TRANSTHORACIC ECHOCARDIOGRAM  02/03/2023   EF 60 to 65%.  Normal function.  No RWMA.  GR 1 DD normal RV.  Normal PAP.  Normal valves.  Normal pressures.   VULVECTOMY N/A 03/12/2019   Procedure: WIDE LOCAL EXCISION OF CARCINOMA IS SITU OF VULVA;  Surgeon: Rockney Evalene SQUIBB, MD;  Location: [facility];  Service: Gynecology;  Laterality: N/A;  request to follow in [facility] block time requests one hour colposcope available in OR with acetic acid .   VULVECTOMY N/A  06/17/2019   Procedure: WIDE LOCAL  EXCISION VULVECTOMY;  Surgeon: Eloy Herring, MD;  Location: [facility];  Service: Gynecology;  Laterality: N/A;    Prior to Admission medications   Medication Sig Start Date End Date Taking? Authorizing Provider  APPLE CIDER VINEGAR PO Take 1 tablet by mouth in the morning, at noon, and at bedtime.   Yes [provider]  calcium  carbonate (CALCIUM  600) 1500 (600 Ca) MG TABS tablet 1 tablet with food Orally Twice a day   Yes [provider]  CALCIUM  PO Take 2 tablets by mouth daily.   Yes [provider]  clonazePAM  (KLONOPIN ) 0.5 MG tablet Take 1 tablet (0.5 mg total) by mouth at bedtime as needed for  anxiety (sleep). 07/25/23  Yes Katrinka Garnette KIDD, MD  CO ENZYME Q-10 PO Take 1 tablet by mouth daily.   Yes [provider]  Cyanocobalamin  (VITAMIN B-12 ER PO) Take 1 tablet by mouth daily.   Yes [provider]  escitalopram  (LEXAPRO ) 10 MG tablet TAKE 1 TABLET BY MOUTH EVERYDAY AT BEDTIME 05/18/23  Yes Katrinka Garnette KIDD, MD  Evolocumab  (REPATHA  SURECLICK) 140 MG/ML SOAJ Inject 140 mg into the skin every 14 (fourteen) days. 12/13/23  Yes Anner Alm ORN, MD  MAGNESIUM PO Take 1 tablet by mouth at bedtime.   Yes [provider]  metoprolol  succinate (TOPROL -XL) 25 MG 24 hr tablet TAKE 1 TABLET BY MOUTH EVERY DAY WITH OR IMMEDIATELY FOLLOWING A MEAL 06/12/23  Yes Anner Alm ORN, MD  Omega 3 1000 MG CAPS 1 capsule.   Yes [provider]  OVER THE COUNTER MEDICATION Take 1 tablet by mouth daily. Life extension   Yes [provider]  Polyethyl Glycol-Propyl Glycol (SYSTANE OP) Place 2 drops into both eyes as needed (dryness).   Yes [provider]  rOPINIRole  (REQUIP ) 0.25 MG tablet TAKE 1 TABLET BY MOUTH EVERYDAY AT BEDTIME 12/06/23  Yes Katrinka Garnette KIDD, MD  rosuvastatin  (CRESTOR ) 5 MG tablet Take 1 tablet (5 mg total) by mouth daily. 01/12/24 04/11/24 Yes Anner Alm ORN, MD  Turmeric (QC TUMERIC COMPLEX PO) Take 1 capsule by mouth daily.   Yes [provider]  valACYclovir  (VALTREX ) 500 MG tablet ONE TAB TWICE A DAY AS NEEDED FOR OUTBREAK 03/30/23  Yes Katrinka Garnette KIDD, MD  Vitamin D -Vitamin K (VITAMIN K2-VITAMIN D3 PO) Take 1 capsule by mouth daily.   Yes [provider]  bisacodyl (DULCOLAX) 5 MG EC tablet Take 5 mg by mouth daily as needed for moderate constipation.    [provider]  meloxicam (MOBIC) 15 MG tablet TAKE 1 TABLET A DAY FOR PAIN AND INFLAMMATION 06/12/23   [provider]  methylcellulose (CITRUCEL) oral powder Take 1 packet by mouth daily. 12/28/23   Zarin Hagmann, Elspeth SQUIBB, MD  nitroGLYCERIN   (NITROSTAT ) 0.4 MG SL tablet Place 0.4 mg under the tongue every 5 (five) minutes as needed. 11/27/23   [provider]  tretinoin (RETIN-A) 0.025 % cream Apply 1 Application topically at bedtime. 12/06/23   [provider]    Current Outpatient Medications  Medication Sig Dispense Refill   APPLE CIDER VINEGAR PO Take 1 tablet by mouth in the morning, at noon, and at bedtime.     calcium  carbonate (CALCIUM  600) 1500 (600 Ca) MG TABS tablet 1 tablet with food Orally Twice a day     CALCIUM  PO Take 2 tablets by mouth daily.     clonazePAM  (KLONOPIN ) 0.5 MG tablet Take  1 tablet (0.5 mg total) by mouth at bedtime as needed for anxiety (sleep). 90 tablet 1   CO ENZYME Q-10 PO Take 1 tablet by mouth daily.     Cyanocobalamin  (VITAMIN B-12 ER PO) Take 1 tablet by mouth daily.     escitalopram  (LEXAPRO ) 10 MG tablet TAKE 1 TABLET BY MOUTH EVERYDAY AT BEDTIME 90 tablet 2   Evolocumab  (REPATHA  SURECLICK) 140 MG/ML SOAJ Inject 140 mg into the skin every 14 (fourteen) days. 6 mL 3   MAGNESIUM PO Take 1 tablet by mouth at bedtime.     metoprolol  succinate (TOPROL -XL) 25 MG 24 hr tablet TAKE 1 TABLET BY MOUTH EVERY DAY WITH OR IMMEDIATELY FOLLOWING A MEAL 90 tablet 3   Omega 3 1000 MG CAPS 1 capsule.     OVER THE COUNTER MEDICATION Take 1 tablet by mouth daily. Life extension     Polyethyl Glycol-Propyl Glycol (SYSTANE OP) Place 2 drops into both eyes as needed (dryness).     rOPINIRole  (REQUIP ) 0.25 MG tablet TAKE 1 TABLET BY MOUTH EVERYDAY AT BEDTIME 90 tablet 1   rosuvastatin  (CRESTOR ) 5 MG tablet Take 1 tablet (5 mg total) by mouth daily. 90 tablet 3   Turmeric (QC TUMERIC COMPLEX PO) Take 1 capsule by mouth daily.     valACYclovir  (VALTREX ) 500 MG tablet ONE TAB TWICE A DAY AS NEEDED FOR OUTBREAK 180 tablet 1   Vitamin D -Vitamin K (VITAMIN K2-VITAMIN D3 PO) Take 1 capsule by mouth daily.     bisacodyl (DULCOLAX) 5 MG EC tablet Take 5 mg by mouth daily as needed for moderate  constipation.     meloxicam (MOBIC) 15 MG tablet TAKE 1 TABLET A DAY FOR PAIN AND INFLAMMATION     methylcellulose (CITRUCEL) oral powder Take 1 packet by mouth daily.     nitroGLYCERIN  (NITROSTAT ) 0.4 MG SL tablet Place 0.4 mg under the tongue every 5 (five) minutes as needed.     tretinoin (RETIN-A) 0.025 % cream Apply 1 Application topically at bedtime.     Current Facility-Administered Medications  Medication Dose Route Frequency Provider Last Rate Last Admin   0.9 %  sodium chloride  infusion  500 mL Intravenous Once [place] Gipe, Elspeth SQUIBB, MD        Allergies as of 01/30/2024 - Review Complete 01/30/2024  Allergen Reaction Noted   Ambien [zolpidem tartrate] Other (See Comments) 10/16/2012   Cortisone Other (See Comments) 07/22/2009   Eszopiclone and related Other (See Comments) 01/17/2013   Levofloxacin Other (See Comments) 12/27/2022   Zolpidem Other (See Comments) 10/16/2012   Dicloxacillin Other (See Comments) 02/02/2011   Dilantin [phenytoin sodium extended] Hives and Itching 02/16/2012   Erythromycin Other (See Comments) 10/03/2006   Amlodipine  Other (See Comments) 02/23/2023   Avelox  [moxifloxacin  hcl in nacl] Other (See Comments) 09/26/2013   Dicloxacillin sodium Other (See Comments) 10/30/2023   Eszopiclone Other (See Comments) 12/27/2022   Gluten meal Other (See Comments) 02/03/2023   Influenza virus vaccine Other (See Comments) 12/27/2022   Isosorbide  Other (See Comments) 02/06/2023   Loratadine  Other (See Comments) 02/06/2023   Moxifloxacin  Other (See Comments) 12/27/2022   Nefazodone Other (See Comments) 12/27/2022   Nitrofuran derivatives Other (See Comments) 10/16/2012   Other Other (See Comments) 01/21/2015   Phenytoin Other (See Comments) 12/27/2022   Phenytoin sodium extended Rash 02/02/2011   Trazodone Other (See Comments) 12/27/2022   Trazodone and nefazodone Other (See Comments) 10/16/2012    Family History  Problem Relation Age of Onset   Failure to  thrive Mother        76   Liver cancer Father        alcohol related likely   Hypertension Father    Diabetes Maternal Aunt    Breast cancer Maternal Aunt 60   Heart disease Maternal Grandmother    Heart disease Maternal Grandfather    Hyperlipidemia Maternal Grandfather    Hypertension Paternal Grandfather    Colon cancer Neg Hx    Thyroid  disease Neg Hx    Colon polyps Neg Hx    Esophageal cancer Neg Hx    Rectal cancer Neg Hx    Stomach cancer Neg Hx     Social History   Socioeconomic History   Marital status: Married    Spouse name: Not on file   Number of children: 2   Years of education: Not on file   Highest education level: Not on file  Occupational History   Occupation: retired  Tobacco Use   Smoking status: Never   Smokeless tobacco: Never  Vaping Use   Vaping status: Never Used  Substance and Sexual Activity   Alcohol use: Yes    Comment: once a week   Drug use: Never   Sexual activity: Not Currently    Partners: Male    Birth control/protection: Surgical    Comment: HYST-1st intercourse 73 yo-Fewer than 5 partners  Other Topics Concern   Not on file  Social History Narrative   Married mother of 46 with 72 year old granddaughter (born in 2019) and another due in 2022. Her son Bernardino is patient of Dr. Katrinka       Lives with husband in a three story home, no issue with stairs.    Highest level of education is an associates.       She exercises regularly, leads an active lifestyle, has a healthy diet, and is very socially engaged. Self-described HSP: highly sensitive person and therefore tends to worry more about symptoms than others might.      Hobbies: enjoys time with friends, time on front porch with friends during covid, park daily, family time   Social Drivers of Health   Financial Resource Strain: Low Risk  (09/19/2023)   Overall Financial Resource Strain (CARDIA)    Difficulty of Paying Living Expenses: Not hard at all  Food Insecurity: No Food  Insecurity (09/19/2023)   Hunger Vital Sign    Worried About Running Out of Food in the Last Year: Never true    Ran Out of Food in the Last Year: Never true  Transportation Needs: No Transportation Needs (09/19/2023)   PRAPARE - Administrator, Civil Service (Medical): No    Lack of Transportation (Non-Medical): No  Physical Activity: Sufficiently Active (09/19/2023)   Exercise Vital Sign    Days of Exercise per Week: 5 days    Minutes of Exercise per Session: 40 min  Stress: No Stress Concern Present (09/19/2023)   [employer] of Occupational Health - Occupational Stress Questionnaire    Feeling of Stress : Only a little  Social Connections: Unknown (09/19/2023)   Social Connection and Isolation Panel    Frequency of Communication with Friends and Family: Not on file    Frequency of Social Gatherings with Friends and Family: Three times a week    Attends Religious Services: Not on file    Active Member of Clubs or Organizations: Not on file    Attends Club or Organization Meetings: Not on file    Marital  Status: Married  Catering manager Violence: Not on file    Review of Systems: All other review of systems negative except as mentioned in the HPI.  Physical Exam: Vital signs BP 127/75   Pulse 70   Temp 97.7 F (36.5 C)   Ht 5' 4 (1.626 m)   Wt 147 lb (66.7 kg)   LMP  (LMP Unknown)   SpO2 98%   BMI 25.23 kg/m   General:   Alert,  Well-developed, pleasant and cooperative in NAD Lungs:  Clear throughout to auscultation.   Heart:  Regular rate and rhythm Abdomen:  Soft, nontender and nondistended.   Neuro/Psych:  Alert and cooperative. Normal mood and affect. A and O x 3  Marcey Naval, MD [facility]

## 2024-01-30 NOTE — Progress Notes (Signed)
0919 Robinul 0.1 mg IV given due large amount of secretions upon assessment.  MD made aware, vss

## 2024-01-30 NOTE — Progress Notes (Signed)
 Called to room to assist during endoscopic procedure.  Patient ID and intended procedure confirmed with present staff. Received instructions for my participation in the procedure from the performing physician.

## 2024-01-30 NOTE — Op Note (Signed)
 [facility] Patient Name: Amanda [name] Procedure Date: 01/30/2024 9:06 AM MRN: [id number] Endoscopist: Elspeth P. Leigh , MD, [id number] Age: 73 Referring MD:  Date of Birth: May 16, 1950 Gender: Female Account #: [id number] Procedure:                Upper GI endoscopy Indications:              Dysphagia - history of GEJ stricture dilated in the                            past, history of celiac disease - some symptoms of                            bloating recently Medicines:                Monitored Anesthesia Care Procedure:                Pre-Anesthesia Assessment:                           - Prior to the procedure, a History and Physical                            was performed, and patient medications and                            allergies were reviewed. The patient's tolerance of                            previous anesthesia was also reviewed. The risks                            and benefits of the procedure and the sedation                            options and risks were discussed with the patient.                            All questions were answered, and informed consent                            was obtained. Prior Anticoagulants: The patient has                            taken no anticoagulant or antiplatelet agents. ASA                            Grade Assessment: II - A patient with mild systemic                            disease. After reviewing the risks and benefits,                            the patient was deemed in satisfactory condition to  undergo the procedure.                           After obtaining informed consent, the endoscope was                            passed under direct vision. Throughout the                            procedure, the patient's blood pressure, pulse, and                            oxygen saturations were monitored continuously. The                            Olympus Scope  [id number] was introduced through the                            mouth, and advanced to the second part of duodenum.                            The upper GI endoscopy was accomplished without                            difficulty. The patient tolerated the procedure                            well. Scope In: Scope Out: Findings:                 Esophagogastric landmarks were identified: the                            Z-line was found at 37 cm, the gastroesophageal                            junction was found at 37 cm and the upper extent of                            the gastric folds was found at 37 cm from the                            incisors. A sliding small 1cm hiatal hernia was                            noted.                           One benign-appearing, intrinsic mild stenosis was                            found 37 cm from the incisors. A TTS dilator was                            passed through the  scope. Dilation with an 18-19-20                            mm balloon dilator was performed to 18 mm and 19 mm                            after which appropriate dilation effect was noted.                           The exam of the esophagus was otherwise normal.                           The entire examined stomach was normal.                           The examined duodenum was normal. Biopsies for                            histology were taken with a cold forceps for                            evaluation of celiac disease. Complications:            No immediate complications. Estimated blood loss:                            Minimal. Estimated Blood Loss:     Estimated blood loss was minimal. Impression:               - Esophagogastric landmarks identified.                           - Sliding 1cm hiatal hernia.                           - Benign-appearing esophageal stenosis. Dilated to                            19mm.                           - Normal stomach.                            - Normal examined duodenum. Biopsied to assess for                            active celiac disease given recent symptoms. Recommendation:           - Patient has a contact number available for                            emergencies. The signs and symptoms of potential                            delayed complications were discussed with the  patient. Return to normal activities tomorrow.                            Written discharge instructions were provided to the                            patient.                           - Post dilation diet                           - Continue present medications.                           - Await pathology results. Elspeth P. Leigh, MD 01/30/2024 9:36:43 AM This report has been signed electronically.

## 2024-01-30 NOTE — Progress Notes (Signed)
 Pt's states no medical or surgical changes since previsit or office visit.

## 2024-01-30 NOTE — Progress Notes (Signed)
 Report given to PACU, vss

## 2024-01-30 NOTE — Patient Instructions (Addendum)
 YOU HAD AN ENDOSCOPIC PROCEDURE TODAY AT THE [facility]:   Refer to the procedure report that was given to you for any specific questions about what was found during the examination.  If the procedure report does not answer your questions, please call your gastroenterologist to clarify.  If you requested that your care partner not be given the details of your procedure findings, then the procedure report has been included in a sealed envelope for you to review at your convenience later.  YOU SHOULD EXPECT: Some feelings of bloating in the abdomen. Passage of more gas than usual.  Walking can help get rid of the air that was put into your GI tract during the procedure and reduce the bloating. If you had a lower endoscopy (such as a colonoscopy or flexible sigmoidoscopy) you may notice spotting of blood in your stool or on the toilet paper. If you underwent a bowel prep for your procedure, you may not have a normal bowel movement for a few days.  Please Note:  You might notice some irritation and congestion in your nose or some drainage.  This is from the oxygen used during your procedure.  There is no need for concern and it should clear up in a day or so.  SYMPTOMS TO REPORT IMMEDIATELY:  Following upper endoscopy (EGD)  Vomiting of blood or coffee ground material  New chest pain or pain under the shoulder blades  Painful or persistently difficult swallowing  New shortness of breath  Fever of 100F or higher  Black, tarry-looking stools  For urgent or emergent issues, a gastroenterologist can be reached at any hour by calling [phone]. Do not use MyChart messaging for urgent concerns.    DIET:  Follow a :Post-Dilation Diet: Nothing to eat or drink until 11:30 am, then CLEAR LIQUIDS ONLY from 11:30 am to 1:30 pm, then follow a SOFT DIET from 1:30 pm for the rest of today. Youi may proceed to your regular diet tomorrow morning as tolerated.  Drink plenty of fluids but you should  avoid alcoholic beverages for 24 hours.  MEDICATIONS: Continue present medications.  FOLLOW UP: Await pathology results.  Handouts given to patient: Post-dilation diet, Esophageal stricture, Hiatal Hernia (GERD).  Thank you for allowing us  to provide for your healthcare needs today.  ACTIVITY:  You should plan to take it easy for the rest of today and you should NOT DRIVE or use heavy machinery until tomorrow (because of the sedation medicines used during the test).    FOLLOW UP: Our staff will call the number listed on your records the next business day following your procedure.  We will call around 7:15- 8:00 am to check on you and address any questions or concerns that you may have regarding the information given to you following your procedure. If we do not reach you, we will leave a message.     If any biopsies were taken you will be contacted by phone or by letter within the next 1-3 weeks.  Please call us  at [phone] if you have not heard about the biopsies in 3 weeks.    SIGNATURES/CONFIDENTIALITY: You and/or your care partner have signed paperwork which will be entered into your electronic medical record.  These signatures attest to the fact that that the information above on your After Visit Summary has been reviewed and is understood.  Full responsibility of the confidentiality of this discharge information lies with you and/or your care-partner.

## 2024-01-31 ENCOUNTER — Telehealth: Payer: Self-pay

## 2024-01-31 NOTE — Telephone Encounter (Signed)
 No answer on follow up call - voice mail message left

## 2024-02-01 ENCOUNTER — Ambulatory Visit: Payer: Self-pay | Admitting: Gastroenterology

## 2024-02-01 LAB — SURGICAL PATHOLOGY

## 2024-02-02 ENCOUNTER — Other Ambulatory Visit: Payer: Self-pay | Admitting: Family Medicine

## 2024-02-22 ENCOUNTER — Other Ambulatory Visit: Payer: Self-pay | Admitting: Family Medicine

## 2024-02-22 DIAGNOSIS — G2581 Restless legs syndrome: Secondary | ICD-10-CM

## 2024-02-22 NOTE — Telephone Encounter (Signed)
 Please see message  Confused- a month ago looks like we gave her a years worth of the 0.5 mg as she requested- plesae contact her to check in     Then it looks like someone sent in the 0.25 mg dose again- please get rid of the 0.25 mg at pharmacy and have them reactivate or resend the 0.5 mg dose

## 2024-02-22 NOTE — Telephone Encounter (Signed)
 Copied from CRM [id number]. Topic: Clinical - Medication Refill >> Feb 22, 2024 10:43 AM Tinnie C wrote: Medication: rOPINIRole  (REQUIP ) (Double her current dose)   Pt talked about doubling her dose for her restless legs with Dr. Katrinka. Since she is taking double, she is worried she will run out early, but she is not sure how many days she has left.  Has the patient contacted their pharmacy? No  This is the patient's preferred pharmacy:  [id number] [place], [place] - [address] [place] [address] Phone: [phone] Fax: [phone]   Is this the correct pharmacy for this prescription? Yes If no, delete pharmacy and type the correct one.   Has the prescription been filled recently? Yes, at different mg  Is the patient out of the medication? No  Has the patient been seen for an appointment in the last year OR does the patient have an upcoming appointment? Yes  Can we respond through MyChart? Yes  Agent: Please be advised that Rx refills may take up to 3 business days. We ask that you follow-up with your pharmacy.

## 2024-02-23 ENCOUNTER — Encounter: Payer: Self-pay | Admitting: Family Medicine

## 2024-02-23 ENCOUNTER — Other Ambulatory Visit (INDEPENDENT_AMBULATORY_CARE_PROVIDER_SITE_OTHER)

## 2024-02-23 ENCOUNTER — Ambulatory Visit: Admitting: Family Medicine

## 2024-02-23 VITALS — BP 120/78 | HR 75 | Temp 97.9°F | Ht 64.0 in | Wt 148.0 lb

## 2024-02-23 DIAGNOSIS — N390 Urinary tract infection, site not specified: Secondary | ICD-10-CM | POA: Diagnosis not present

## 2024-02-23 DIAGNOSIS — E88819 Insulin resistance, unspecified: Secondary | ICD-10-CM

## 2024-02-23 DIAGNOSIS — Z131 Encounter for screening for diabetes mellitus: Secondary | ICD-10-CM

## 2024-02-23 DIAGNOSIS — R350 Frequency of micturition: Secondary | ICD-10-CM | POA: Diagnosis not present

## 2024-02-23 DIAGNOSIS — I10 Essential (primary) hypertension: Secondary | ICD-10-CM

## 2024-02-23 DIAGNOSIS — T83511A Infection and inflammatory reaction due to indwelling urethral catheter, initial encounter: Secondary | ICD-10-CM | POA: Diagnosis not present

## 2024-02-23 LAB — COMPREHENSIVE METABOLIC PANEL WITH GFR
ALT: 17 U/L (ref 0–35)
AST: 25 U/L (ref 0–37)
Albumin: 4.7 g/dL (ref 3.5–5.2)
Alkaline Phosphatase: 56 U/L (ref 39–117)
BUN: 20 mg/dL (ref 6–23)
CO2: 29 meq/L (ref 19–32)
Calcium: 9.4 mg/dL (ref 8.4–10.5)
Chloride: 105 meq/L (ref 96–112)
Creatinine, Ser: 0.92 mg/dL (ref 0.40–1.20)
GFR: 61.62 mL/min (ref >60.00)
Glucose, Bld: 91 mg/dL (ref 70–99)
Potassium: 4.2 meq/L (ref 3.5–5.1)
Sodium: 142 meq/L (ref 135–145)
Total Bilirubin: 0.6 mg/dL (ref 0.2–1.2)
Total Protein: 7.5 g/dL (ref 6.0–8.3)

## 2024-02-23 LAB — POCT URINALYSIS DIPSTICK
Bilirubin, UA: NEGATIVE
Blood, UA: NEGATIVE
Glucose, UA: NEGATIVE
Ketones, UA: NEGATIVE
Leukocytes, UA: NEGATIVE
Nitrite, UA: NEGATIVE
Protein, UA: NEGATIVE
Spec Grav, UA: 1.01 (ref 1.010–1.025)
Urobilinogen, UA: 0.2 U/dL
pH, UA: 6.5 (ref 5.0–8.0)

## 2024-02-23 LAB — HEMOGLOBIN A1C: Hgb A1c MFr Bld: 5.9 % (ref 4.6–6.5)

## 2024-02-23 NOTE — Progress Notes (Signed)
   Amanda [name] is a 73 y.o. female who presents today for an office visit.  Assessment/Plan:  Urinary Frequency  No red flags.  UA negative.  May have mild overactive bladder.  PCP check labs this morning including c-Met and hemoglobin A1c may also be secondary to polydipsia as her specific gravity is on the lower range of normal.  Urine culture is pending to definitively rule out UTI.  She is not sure if she would like to start medication for OAB at this point.  We will contact her once we get results back on urine culture however if symptoms persist she may be open to this.  She can discuss further with her PCP at their next appointment.    Subjective:  HPI:  See assessment / plan for status of chronic conditions.      Discussed the use of AI scribe software for clinical note transcription with the patient, who gave verbal consent to proceed.  History of Present Illness Amanda [name] is a 73 year old female who presents with urinary frequency.  She has experienced increased urinary frequency for the past one to two months, with a need to urinate shortly after doing so and nocturia around 3 AM, passing significant amounts of urine each time. No dysuria, pain, fever, or chills. Occasionally, she experiences urgency. No recent changes in diet, medications, or supplements that could account for these symptoms. She recently started Repatha  a month ago.  She maintains an active lifestyle, exercising almost daily, including two-mile walks with her husband. She is conscious of hydration, drinking water regularly and consuming an electrolyte supplement daily to prevent dehydration, as she has a history of hospitalization for dehydration.         Objective:  Physical Exam: BP 120/78   Pulse 75   Temp 97.9 F (36.6 C) (Temporal)   Ht 5' 4 (1.626 m)   Wt 148 lb (67.1 kg)   LMP  (LMP Unknown)   SpO2 97%   BMI 25.40 kg/m   Gen: No acute distress, resting comfortably CV:  Regular rate and rhythm with no murmurs appreciated Pulm: Normal work of breathing, clear to auscultation bilaterally with no crackles, wheezes, or rhonchi MUSCULOSKELETAL: No CVA tenderness Neuro: Grossly normal, moves all extremities Psych: Normal affect and thought content      Raney Koeppen M. Kennyth, MD 02/23/2024 2:45 PM

## 2024-02-23 NOTE — Patient Instructions (Signed)
 It was very nice to see you today!  VISIT SUMMARY: Today, we discussed your increased urinary frequency and mild fatigue. We reviewed your symptoms and performed a urinalysis.  YOUR PLAN: URINARY FREQUENCY AND SUSPECTED OVERACTIVE BLADDER: You have been experiencing increased urinary frequency and nighttime urination without signs of infection or inflammation. -We will perform a urine culture to rule out a urinary tract infection. We will contact you with the results on Monday. -If the culture is negative and your symptoms persist, we will discuss medication options for overactive bladder.  Return if symptoms worsen or fail to improve.   Take care, Dr Kennyth  PLEASE NOTE:  If you had any lab tests, please let us  know if you have not heard back within a few days. You may see your results on mychart before we have a chance to review them but we will give you a call once they are reviewed by us .   If we ordered any referrals today, please let us  know if you have not heard from their office within the next week.   If you had any urgent prescriptions sent in today, please check with the pharmacy within an hour of our visit to make sure the prescription was transmitted appropriately.   Please try these tips to maintain a healthy lifestyle:  Eat at least 3 REAL meals and 1-2 snacks per day.  Aim for no more than 5 hours between eating.  If you eat breakfast, please do so within one hour of getting up.   Each meal should contain half fruits/vegetables, one quarter protein, and one quarter carbs (no bigger than a computer mouse)  Cut down on sweet beverages. This includes juice, soda, and sweet tea.   Drink at least 1 glass of water with each meal and aim for at least 8 glasses per day  Exercise at least 150 minutes every week.

## 2024-02-24 ENCOUNTER — Ambulatory Visit: Payer: Self-pay | Admitting: Family Medicine

## 2024-02-24 LAB — URINE CULTURE
MICRO NUMBER:: 17114498
SPECIMEN QUALITY:: ADEQUATE

## 2024-02-26 ENCOUNTER — Ambulatory Visit: Payer: Self-pay | Admitting: Family Medicine

## 2024-02-26 NOTE — Progress Notes (Signed)
 Her urine culture was inconclusive.  Recommend she finish her course of antibiotics and let us  know if not improving.

## 2024-02-27 NOTE — Telephone Encounter (Signed)
**Note De-identified  [place] Obfuscation** Please advise 

## 2024-03-02 ENCOUNTER — Other Ambulatory Visit (HOSPITAL_COMMUNITY): Payer: Self-pay

## 2024-03-04 ENCOUNTER — Other Ambulatory Visit (HOSPITAL_COMMUNITY): Payer: Self-pay

## 2024-03-04 ENCOUNTER — Telehealth: Payer: Self-pay | Admitting: Pharmacist

## 2024-03-04 DIAGNOSIS — E785 Hyperlipidemia, unspecified: Secondary | ICD-10-CM

## 2024-03-04 NOTE — Telephone Encounter (Signed)
 Patient called back will be going for lipid lab this week- she si busy due to her husband's surgery. She was not able to cut rosuvastatin  in half so has been taking 10 mg rosuvastatin  daily along with Repatha  every 14 days.

## 2024-03-04 NOTE — Telephone Encounter (Signed)
 Call to remind pt about lipid lab, N/A LVM and MyChart sent.

## 2024-03-14 DIAGNOSIS — E785 Hyperlipidemia, unspecified: Secondary | ICD-10-CM | POA: Diagnosis not present

## 2024-03-15 ENCOUNTER — Ambulatory Visit: Payer: Self-pay | Admitting: Pharmacist

## 2024-03-15 LAB — LIPID PANEL
Chol/HDL Ratio: 1.5 ratio (ref 0.0–4.4)
Cholesterol, Total: 130 mg/dL (ref 100–199)
HDL: 86 mg/dL (ref >39)
LDL Chol Calc (NIH): 32 mg/dL (ref 0–99)
Triglycerides: 55 mg/dL (ref 0–149)
VLDL Cholesterol Cal: 12 mg/dL (ref 5–40)

## 2024-03-20 ENCOUNTER — Encounter: Payer: Self-pay | Admitting: Family

## 2024-03-20 ENCOUNTER — Ambulatory Visit: Admitting: Family

## 2024-03-20 ENCOUNTER — Ambulatory Visit: Payer: Self-pay

## 2024-03-20 VITALS — BP 124/76 | HR 74 | Temp 97.5°F | Ht 64.0 in | Wt 146.0 lb

## 2024-03-20 DIAGNOSIS — J3489 Other specified disorders of nose and nasal sinuses: Secondary | ICD-10-CM

## 2024-03-20 DIAGNOSIS — H00011 Hordeolum externum right upper eyelid: Secondary | ICD-10-CM

## 2024-03-20 MED ORDER — TRIAMCINOLONE ACETONIDE 55 MCG/ACT NA AERO: 1.0000 | INHALATION_SPRAY | Freq: Every day | NASAL | 2 refills | Status: AC

## 2024-03-20 MED ORDER — ERYTHROMYCIN 5 MG/GM OP OINT: 1.0000 | TOPICAL_OINTMENT | Freq: Four times a day (QID) | OPHTHALMIC | 0 refills | Status: AC

## 2024-03-20 NOTE — Telephone Encounter (Signed)
 Patient scheduled to see [name] 03/20/2024. Dr. Katrinka will review triage notes.

## 2024-03-20 NOTE — Progress Notes (Signed)
 Patient ID: Amanda [name], female    DOB: June 19, 1950, 73 y.o.   MRN: [id number]  Chief Complaint  Patient presents with   Eye Pain   Eyelid Pain    Right eye pain also so never pain shooting from eye to head. Eye lid red and swollen. Noticed the pain first a week ago. Has tired warm compress.   Discussed the use of AI scribe software for clinical note transcription with the patient, who gave verbal consent to proceed.  History of Present Illness Amanda [name] is a 73 year old female who presents with eye pain and swelling.  She has experienced shooting pain around her eye for about a week, with worsening pain, swelling, and redness over the past two days. This morning, she noticed increased swelling despite using a warm compress last night. She has never had a stye before and is concerned about the swelling and tenderness of her eyelid. She feels pressure and pain in her sinuses, with difficulty breathing through one nostril. She uses peppermint essential oil for relief and has used Flonase or Nasacort in the past, though she is unsure if she currently has any available. Drainage is clear, and there is no yellow discharge. Pain sometimes occurs when bending her head down. She has a history of dry eyes and uses frozen blueberries in a plastic bag over her eyes for relief, along with regular use of cystic eye drops.  Assessment & Plan Stye of right upper eyelid Acute stye on the right upper eyelid with increased swelling and redness. Likely due to a blocked lacrimal gland. No signs of current infection. - Continue to Apply warm compresses to the affected eyelid 3-4 times daily until it cools off. - Prescribed erythromycin topical ointment (stomach upset allergy to oral erythromycin, should not be an issue with topical) for use if the stye opens and drains, can use on eyelid now along eyelash level. - Use baby shampoo to gently clean the eyelid and eyelashes during showers. - Avoid  popping the stye if it comes to a head. - Call the office if still persistent another 1-2 weeks.   Chronic sinus congestion Clear nasal drainage with congestion on right side & sinus pressure on right side of face, forehead on top of head most likely due to congestion. No indication for antibiotics currently. Using peppermint oil in nares. - Prescribed Nasacort nasal spray, one squirt in each nostril twice daily for a few days, then reduce to once daily to help open sinuses. - Ok to continue to use peppermint essential oil in nostrils to help open nasal passages, also recommend saline nasal spray for disinfection & moisture. - Call back if symptoms are persisting after 4 days of using the Nasacort.   Subjective:    Outpatient Medications Prior to Visit  Medication Sig Dispense Refill   APPLE CIDER VINEGAR PO Take 1 tablet by mouth in the morning, at noon, and at bedtime.     bisacodyl (DULCOLAX) 5 MG EC tablet Take 5 mg by mouth daily as needed for moderate constipation.     calcium  carbonate (CALCIUM  600) 1500 (600 Ca) MG TABS tablet 1 tablet with food Orally Twice a day     clonazePAM  (KLONOPIN ) 0.5 MG tablet Take 1 tablet (0.5 mg total) by mouth at bedtime as needed for anxiety (sleep). 90 tablet 1   CO ENZYME Q-10 PO Take 1 tablet by mouth daily.     Cyanocobalamin  (VITAMIN B-12 ER PO) Take 1 tablet  by mouth daily.     escitalopram  (LEXAPRO ) 10 MG tablet TAKE 1 TABLET BY MOUTH EVERYDAY AT BEDTIME 90 tablet 2   Evolocumab  (REPATHA  SURECLICK) 140 MG/ML SOAJ Inject 140 mg into the skin every 14 (fourteen) days. 6 mL 3   MAGNESIUM PO Take 1 tablet by mouth at bedtime.     meloxicam (MOBIC) 15 MG tablet TAKE 1 TABLET A DAY FOR PAIN AND INFLAMMATION (Patient taking differently: as needed.)     metoprolol  succinate (TOPROL -XL) 25 MG 24 hr tablet TAKE 1 TABLET BY MOUTH EVERY DAY WITH OR IMMEDIATELY FOLLOWING A MEAL 90 tablet 3   Omega 3 1000 MG CAPS 1 capsule.     OVER THE COUNTER MEDICATION  Take 1 tablet by mouth daily. Life extension     Polyethyl Glycol-Propyl Glycol (SYSTANE OP) Place 2 drops into both eyes as needed (dryness).     rOPINIRole  (REQUIP ) 0.25 MG tablet TAKE 1 TABLET BY MOUTH EVERYDAY AT BEDTIME 90 tablet 1   rosuvastatin  (CRESTOR ) 5 MG tablet Take 1 tablet (5 mg total) by mouth daily. 90 tablet 3   Turmeric (QC TUMERIC COMPLEX PO) Take 1 capsule by mouth daily.     valACYclovir  (VALTREX ) 500 MG tablet ONE TAB TWICE A DAY AS NEEDED FOR OUTBREAK 180 tablet 1   Vitamin D -Vitamin K (VITAMIN K2-VITAMIN D3 PO) Take 1 capsule by mouth daily.     CALCIUM  PO Take 2 tablets by mouth daily. (Patient not taking: Reported on 03/20/2024)     methylcellulose (CITRUCEL) oral powder Take 1 packet by mouth daily.     nitroGLYCERIN  (NITROSTAT ) 0.4 MG SL tablet Place 0.4 mg under the tongue every 5 (five) minutes as needed. (Patient not taking: Reported on 03/20/2024)     tretinoin (RETIN-A) 0.025 % cream Apply 1 Application topically at bedtime. (Patient not taking: Reported on 03/20/2024)     Facility-Administered Medications Prior to Visit  Medication Dose Route Frequency Provider Last Rate Last Admin   0.9 %  sodium chloride  infusion  500 mL Intravenous Once Armbruster, Elspeth SQUIBB, MD       Past Medical History:  Diagnosis Date   Anemia    Anxiety    Celiac disease    Chronic constipation    Coronary artery spasm    COVID 02/12/2021   COVID-19 virus vaccine not available    Erythema migrans (Lyme disease) 12/17/2021   GERD (gastroesophageal reflux disease)    occasionally (per pt takes apple cider vinager)   Heart palpitations    cardiologist-- dr anner--- event monitor 08-24-2011 epic ;  nuclear stress test 03-16-2011 (epic) normal w/ no ischemia, ef 65%;  echo 01-18-2013  ef 60-65%, G1DD   Hiatal hernia    History of subdural hematoma    12/ 23/ 2010  s/p  craniotomy w/ hematoma evacuation (pt had a fall w/ concussion)  per pt no residual   History of thyroid  cancer  followed by dr jesus---   s/p  left thyroidectomy --- per pt no radiation and no recurrence   Hyperlipidemia    Hypertension    IBS (irritable bowel syndrome)    Immunization, single disease 06/09/2019   1st dose moderma vaccine administered   Insomnia    OA (osteoarthritis)    Osteopenia, T score -2.1 FRAX 9.4%/1.3% stable from prior DEXA 06/2015   Restless leg syndrome    Right thyroid  nodule    followed by dr jesus--- last ultrasound in epic 03-30-2018 stable , to bx   STD (  sexually transmitted disease), HSV    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 Normal EF.AB::   REDUCTION MAMMAPLASTY Bilateral    SUBDURAL HEMATOMA EVACUATION VIA CRANIOTOMY  04-30-2009  @[facility]    left frontotemporapartietal    THYROID  LOBECTOMY Left 05-04-2001  @[facility]   dr jesus   TONSILLECTOMY     TRANSTHORACIC ECHOCARDIOGRAM  02/03/2023   EF 60 to 65%.  Normal function.  No RWMA.  GR 1 DD normal RV.  Normal PAP.  Normal valves.  Normal pressures.   VULVECTOMY N/A 03/12/2019   Procedure: WIDE LOCAL EXCISION OF CARCINOMA IS SITU OF VULVA;  Surgeon: Rockney Evalene SQUIBB, MD;  Location: [facility];  Service: Gynecology;  Laterality: N/A;  request to follow in [facility] block time requests one hour colposcope available in OR with acetic acid .   VULVECTOMY N/A 06/17/2019   Procedure: WIDE LOCAL  EXCISION VULVECTOMY;  Surgeon: Eloy Herring, MD;   Location: [facility];  Service: Gynecology;  Laterality: N/A;   Allergies  Allergen Reactions   Ambien [Zolpidem Tartrate] Other (See Comments)    Causes Neurological problems with very bad dizziness, pains, disorientation   Cortisone Other (See Comments)    REACTION: mania post ESI   Eszopiclone And Related Other (See Comments)    Causes Neurological problems with very bad dizziness, pains, disorientation   Levofloxacin Other (See Comments)    Muscle spasms and jerking movements   Zolpidem Other (See Comments)    Causes Neurological problems with very bad dizziness, pains, disorientation   Dicloxacillin Other (See Comments)    Stomach problems, swelling of tongue   Dilantin [Phenytoin Sodium Extended] Hives and Itching   Erythromycin Other (See Comments)    Tears stomach up   Amlodipine  Other (See Comments)    Head jerks at night   Avelox  [Moxifloxacin  Hcl In Nacl] Other (See Comments)    Pt unsure of reaction.   Dicloxacillin Sodium Other (See Comments)   Eszopiclone Other (See Comments)   Gluten Meal Other (See Comments)   Influenza Virus Vaccine Other (See Comments)    Unable to recall   Isosorbide  Other (See Comments)    Unable to recall- years ago   Loratadine  Other (See Comments)    Unable to recall per pt   Moxifloxacin  Other (See Comments)    Unable to recall   Nefazodone Other (See Comments)    Unable to recall   Nitrofuran Derivatives Other (See Comments)    unknown   Other Other (See Comments)    Celiac disease- Patient denies    Phenytoin Other (See Comments)    Unable to recall- years ago   Phenytoin Sodium Extended Rash   Trazodone Other (See Comments)    Didn't feel good per pt   Trazodone And Nefazodone Other (See Comments)    dizzy      Objective:    Physical Exam Vitals and nursing note reviewed.  Constitutional:      Appearance: Normal appearance.  HENT:     Nose:     Right Sinus: Maxillary sinus tenderness and frontal  sinus tenderness present.     Left Sinus: No frontal sinus tenderness.  Eyes:     General:        Right eye: Hordeolum (upper eyelid) present.     Extraocular Movements: Extraocular movements intact.     Conjunctiva/sclera: Conjunctivae normal.  Cardiovascular:     Rate and Rhythm: Normal rate and regular rhythm.  Pulmonary:     Effort: Pulmonary effort is normal.     Breath sounds: Normal breath sounds.  Musculoskeletal:        General: Normal range of motion.  Skin:    General: Skin is warm and dry.  Neurological:     Mental Status: She is alert.  Psychiatric:        Mood and Affect: Mood normal.        Behavior: Behavior normal.    BP 124/76   Pulse 74   Temp (!) 97.5 F (36.4 C) (Temporal)   Ht 5' 4 (1.626 m)   Wt 146 lb (66.2 kg)   LMP  (LMP Unknown)   SpO2 98%   BMI 25.06 kg/m  Wt Readings from Last 3 Encounters:  03/20/24 146 lb (66.2 kg)  02/23/24 148 lb (67.1 kg)  01/30/24 147 lb (66.7 kg)      Lucius Krabbe, NP

## 2024-03-20 NOTE — Telephone Encounter (Signed)
 FYI Only or Action Required?: FYI only for provider: appointment scheduled on 03/20/2024.  Patient was last seen in primary care on 02/23/2024 by Kennyth Worth HERO, MD.  Called Nurse Triage reporting Eye Pain.  Symptoms began several days ago.  Interventions attempted: Other: Essential oils.  Symptoms are: gradually worsening.  Triage Disposition: See HCP Within 4 Hours (Or PCP Triage)  Patient/caregiver understands and will follow disposition?: Yes        Copied from CRM [id number]. Topic: Clinical - Red Word Triage >> Mar 20, 2024  8:50 AM Antwanette L wrote: Red Word that prompted transfer to Nurse Triage: The patient reports redness and pain in the upper right eyelid, accompanied by shooting pain in the head. These symptoms have been present for the past 2-3 days. Reason for Disposition  MODERATE eye pain or discomfort (e.g., interferes with normal activities or awakens from sleep; more than mild)  Answer Assessment - Initial Assessment Questions 1. ONSET: When did the pain start? (e.g., minutes, hours, days)     2-3 days  2. TIMING: Does the pain come and go, or has it been constant since it started? (e.g., constant, intermittent, fleeting)     Comes and goes and is a shooting pain  3. SEVERITY: How bad is the pain?  (Scale 1-10; mild, moderate or severe)     She states it is a heavy feeling  4. LOCATION: Where does it hurt?  (e.g., eyelid, eye, cheekbone)     R upper eyelid area  5. CAUSE: What do you think is causing the pain?     Unsure  6. VISION: Do you have blurred vision or changes in your vision?      Denies  7. EYE DISCHARGE: Is there any discharge (pus) from the eye(s)?  If Yes, ask: What color is it?      Denies 8. FEVER: Do you have a fever? If Yes, ask: What is it, how was it measured, and when did it start?      Denies  9. OTHER SYMPTOMS: Do you have any other symptoms? (e.g., headache, nasal discharge, facial rash)     R sinus pain    Using essential oils for symptoms.  Protocols used: Eye Pain and Other Symptoms-A-AH

## 2024-03-25 ENCOUNTER — Other Ambulatory Visit: Payer: Self-pay | Admitting: Family Medicine

## 2024-03-25 NOTE — Telephone Encounter (Unsigned)
 Copied from CRM #[id number]. Topic: Clinical - Medication Refill >> Mar 25, 2024  3:29 PM Thersia C wrote: Medication: clonazePAM  (KLONOPIN ) 0.5 MG tablet  Has the patient contacted their pharmacy? Yes (Agent: If no, request that the patient contact the pharmacy for the refill. If patient does not wish to contact the pharmacy document the reason why and proceed with request.) (Agent: If yes, when and what did the pharmacy advise?)  This is the patient's preferred pharmacy:  [id number] [address], [place] - [address] [place] [address] Phone: [phone] Fax: [phone]  Is this the correct pharmacy for this prescription? Yes If no, delete pharmacy and type the correct one.   Has the prescription been filled recently? No  Is the patient out of the medication? Yes  Has the patient been seen for an appointment in the last year OR does the patient have an upcoming appointment? Yes  Can we respond through MyChart? Yes  Agent: Please be advised that Rx refills may take up to 3 business days. We ask that you follow-up with your pharmacy.

## 2024-04-23 ENCOUNTER — Telehealth: Payer: Self-pay | Admitting: Family Medicine

## 2024-04-23 MED ORDER — ROPINIROLE HCL 0.5 MG PO TABS: 0.5000 mg | ORAL_TABLET | Freq: Every day | ORAL | 3 refills | Status: AC

## 2024-04-23 NOTE — Telephone Encounter (Signed)
I sent this in to her local pharmacy

## 2024-04-23 NOTE — Addendum Note (Signed)
 Addended by: KATRINKA GARNETTE KIDD on: 04/23/2024 12:24 PM   Modules accepted: Orders

## 2024-04-23 NOTE — Telephone Encounter (Signed)
 Please see patient message and advise.   Copied from CRM #[id number]. Topic: Clinical - Medication Question >> Apr 23, 2024 10:57 AM Ahlexyia S wrote: Reason for CRM: Pt called in requesting to switch medication rOPINIRole  (REQUIP ) 0.25 MG tablet back to 0.5mg . Pt is requesting a refill for this medication due to her being out. Per pt chart there was a request for this to be switched on 10/16 and provider responded back stating that someone sent in the 0.25mg  dose and now someone is needing to have them reactivate or resend the 0.5mg  dose. Pt is requesting a callback for this.

## 2024-04-26 ENCOUNTER — Encounter: Payer: Self-pay | Admitting: Cardiology

## 2024-05-03 ENCOUNTER — Other Ambulatory Visit: Payer: Self-pay | Admitting: Cardiology

## 2024-05-13 ENCOUNTER — Other Ambulatory Visit (HOSPITAL_COMMUNITY): Payer: Self-pay

## 2024-05-14 DIAGNOSIS — N644 Mastodynia: Secondary | ICD-10-CM

## 2024-05-14 DIAGNOSIS — N6310 Unspecified lump in the right breast, unspecified quadrant: Secondary | ICD-10-CM

## 2024-05-14 NOTE — Telephone Encounter (Signed)
 Patient left message on triage line requesting return call to further discuss right breast pain. Requesting Dx IMG order.

## 2024-05-14 NOTE — Addendum Note (Signed)
 Addended by: Lizbet Cirrincione N on: 05/14/2024 01:12 PM   Modules accepted: Orders

## 2024-05-14 NOTE — Telephone Encounter (Signed)
 Spoke with patient.  Patient reports intermittent ongoing pain in right breast for the past 2 months. Reports pain is present when moving, thinks she feels a pea size lump. Denies skin changes, redness, itching or nipple discharge.   Last screening MMG 01/31/23 Dx right breast MMG 09/13/23 AEX 02/23/23 -EB  OV scheduled for 05/15/24 at 1000 with Dr. Glennon.  Routing to provider for final review. Patient is agreeable to disposition. Will close encounter.

## 2024-05-14 NOTE — Telephone Encounter (Signed)
 Spoke with patient, advised per Dr. Glennon.  Patient will keep OV as scheduled. Patient will call TBC directly to schedule Imaging, patient aware to call if she needs any further assistance with scheduling.

## 2024-05-14 NOTE — Telephone Encounter (Signed)
 Please confirm Dx for order

## 2024-05-14 NOTE — Telephone Encounter (Signed)
 Per review of EPIC, patient is scheduled for imaging on 05/22/24.

## 2024-05-15 ENCOUNTER — Encounter: Payer: Self-pay | Admitting: Radiology

## 2024-05-15 ENCOUNTER — Ambulatory Visit: Admitting: Obstetrics and Gynecology

## 2024-05-15 ENCOUNTER — Ambulatory Visit: Admitting: Radiology

## 2024-05-15 VITALS — BP 104/70 | HR 72 | Temp 98.4°F | Wt 147.0 lb

## 2024-05-15 DIAGNOSIS — N644 Mastodynia: Secondary | ICD-10-CM

## 2024-05-15 NOTE — Progress Notes (Signed)
" ° °  Amanda [name] 1950-11-12 [id number]   History:  74 y.o. G0 presents with complaints of right breast pain intermittent x's 2 months, with pea sized lump at areola. Hx of breast reduction.   Does drink 1 small cup of coffee every day.  M 09/13/23, scheduled for diagnostic mammogram, u/s 05/22/24   Gynecologic History No LMP recorded (lmp unknown). Patient has had a hysterectomy.    Obstetric History OB History  Gravida Para Term Preterm AB Living  2 2 2   2   SAB IAB Ectopic Multiple Live Births          # Outcome Date GA Lbr Len/2nd Weight Sex Type Anes PTL Lv  2 Term           1 Term              The following portions of the patient's history were reviewed and updated as appropriate: allergies, current medications, past family history, past medical history, past social history, past surgical history, and problem list.  Review of Systems Pertinent items noted in HPI and remainder of comprehensive ROS otherwise negative.   Past medical history, past surgical history, family history and social history were all reviewed and documented in the EPIC chart.   Exam:  Vitals:   05/15/24 1027  BP: 104/70  Pulse: 72  Temp: 98.4 F (36.9 C)  TempSrc: Oral  SpO2: 97%  Weight: 147 lb (66.7 kg)   Body mass index is 25.23 kg/m.  Physical Exam Constitutional:      Appearance: Normal appearance. She is normal weight.  Pulmonary:     Effort: Pulmonary effort is normal.  Chest:     Chest wall: No mass, swelling or tenderness.  Breasts:    Right: Mass and tenderness present.     Left: Normal.       Comments: Scar tissue vs cystic areas, very tender to palpation Neurological:     Mental Status: She is alert.  Psychiatric:        Mood and Affect: Mood normal.        Thought Content: Thought content normal.        Judgment: Judgment normal.        Assessment/Plan:   1. Pain of right breast (Primary) Dx mammo and u/s ordered Pt will keep appt as  scheduled Will cut out caffeine and try Vitamin E 400iu BID x 14 days to help with pain     Latrisha Coiro B WHNP-BC 10:42 AM 05/15/2024  "

## 2024-05-22 ENCOUNTER — Ambulatory Visit
Admission: RE | Admit: 2024-05-22 | Discharge: 2024-05-22 | Disposition: A | Discharge status: Home / Self Care | Source: Ambulatory Visit | Attending: Obstetrics and Gynecology | Admitting: Obstetrics and Gynecology

## 2024-05-22 DIAGNOSIS — N6310 Unspecified lump in the right breast, unspecified quadrant: Secondary | ICD-10-CM

## 2024-05-22 DIAGNOSIS — N644 Mastodynia: Secondary | ICD-10-CM

## 2024-05-23 ENCOUNTER — Ambulatory Visit: Payer: Self-pay | Admitting: Obstetrics and Gynecology

## 2024-05-25 ENCOUNTER — Other Ambulatory Visit: Payer: Self-pay | Admitting: Family Medicine

## 2024-05-25 DIAGNOSIS — G2581 Restless legs syndrome: Secondary | ICD-10-CM

## 2024-06-17 ENCOUNTER — Encounter: Admitting: Family Medicine

## 2024-06-19 ENCOUNTER — Encounter: Admitting: Obstetrics and Gynecology
